# Patient Record
Sex: Male | Born: 1960 | Race: White | Hispanic: No | Marital: Single | Smoking: Former smoker
Health system: Southern US, Community
[De-identification: ages and names within clinical notes are randomized; demographics above are authoritative.]

## PROBLEM LIST (undated history)

## (undated) DIAGNOSIS — E119 Type 2 diabetes mellitus without complications: Secondary | ICD-10-CM

## (undated) DIAGNOSIS — C61 Malignant neoplasm of prostate: Secondary | ICD-10-CM

## (undated) DIAGNOSIS — F209 Schizophrenia, unspecified: Secondary | ICD-10-CM

## (undated) DIAGNOSIS — I10 Essential (primary) hypertension: Secondary | ICD-10-CM

## (undated) HISTORY — PX: INSERTION PROSTATE RADIATION SEED: SUR718

---

## 2013-04-28 DIAGNOSIS — F319 Bipolar disorder, unspecified: Secondary | ICD-10-CM | POA: Insufficient documentation

## 2018-05-15 DIAGNOSIS — N183 Chronic kidney disease, stage 3 unspecified: Secondary | ICD-10-CM | POA: Insufficient documentation

## 2018-12-14 ENCOUNTER — Other Ambulatory Visit: Payer: Self-pay

## 2018-12-14 ENCOUNTER — Encounter: Payer: Self-pay | Admitting: Emergency Medicine

## 2018-12-14 ENCOUNTER — Emergency Department
Admission: EM | Admit: 2018-12-14 | Discharge: 2018-12-15 | Disposition: A | Discharge status: Other Acute Inpatient Hospital | Payer: Medicaid Other | Attending: Pediatrics | Admitting: Pediatrics

## 2018-12-14 DIAGNOSIS — F200 Paranoid schizophrenia: Secondary | ICD-10-CM | POA: Insufficient documentation

## 2018-12-14 DIAGNOSIS — Z20828 Contact with and (suspected) exposure to other viral communicable diseases: Secondary | ICD-10-CM | POA: Insufficient documentation

## 2018-12-14 DIAGNOSIS — F209 Schizophrenia, unspecified: Secondary | ICD-10-CM | POA: Insufficient documentation

## 2018-12-14 DIAGNOSIS — R45851 Suicidal ideations: Secondary | ICD-10-CM | POA: Diagnosis not present

## 2018-12-14 DIAGNOSIS — I10 Essential (primary) hypertension: Secondary | ICD-10-CM | POA: Diagnosis not present

## 2018-12-14 DIAGNOSIS — E119 Type 2 diabetes mellitus without complications: Secondary | ICD-10-CM | POA: Diagnosis not present

## 2018-12-14 HISTORY — DX: Schizophrenia, unspecified: F20.9

## 2018-12-14 HISTORY — DX: Type 2 diabetes mellitus without complications: E11.9

## 2018-12-14 HISTORY — DX: Essential (primary) hypertension: I10

## 2018-12-14 LAB — URINE DRUG SCREEN, QUALITATIVE (ARMC ONLY)
Amphetamines, Ur Screen: NOT DETECTED
Barbiturates, Ur Screen: NOT DETECTED
Benzodiazepine, Ur Scrn: POSITIVE — AB
Cannabinoid 50 Ng, Ur ~~LOC~~: NOT DETECTED
Cocaine Metabolite,Ur ~~LOC~~: NOT DETECTED
MDMA (Ecstasy)Ur Screen: NOT DETECTED
Methadone Scn, Ur: NOT DETECTED
Opiate, Ur Screen: NOT DETECTED
Phencyclidine (PCP) Ur S: NOT DETECTED
Tricyclic, Ur Screen: NOT DETECTED

## 2018-12-14 LAB — COMPREHENSIVE METABOLIC PANEL
ALT: 14 U/L (ref 0–44)
AST: 46 U/L — ABNORMAL HIGH (ref 15–41)
Albumin: 4.6 g/dL (ref 3.5–5.0)
Alkaline Phosphatase: 61 U/L (ref 38–126)
Anion gap: 11 (ref 5–15)
BUN: 22 mg/dL — ABNORMAL HIGH (ref 6–20)
CO2: 28 mmol/L (ref 22–32)
Calcium: 9.3 mg/dL (ref 8.9–10.3)
Chloride: 102 mmol/L (ref 98–111)
Creatinine, Ser: 1.29 mg/dL — ABNORMAL HIGH (ref 0.61–1.24)
GFR calc Af Amer: >60 mL/min (ref >60)
GFR calc non Af Amer: 60 mL/min — ABNORMAL LOW (ref >60)
Glucose, Bld: 124 mg/dL — ABNORMAL HIGH (ref 70–99)
Potassium: 3.6 mmol/L (ref 3.5–5.1)
Sodium: 141 mmol/L (ref 135–145)
Total Bilirubin: 0.9 mg/dL (ref 0.3–1.2)
Total Protein: 7.9 g/dL (ref 6.5–8.1)

## 2018-12-14 LAB — CBC
HCT: 31.5 % — ABNORMAL LOW (ref 39.0–52.0)
Hemoglobin: 11.1 g/dL — ABNORMAL LOW (ref 13.0–17.0)
MCH: 29.8 pg (ref 26.0–34.0)
MCHC: 35.2 g/dL (ref 30.0–36.0)
MCV: 84.7 fL (ref 80.0–100.0)
Platelets: 154 10*3/uL (ref 150–400)
RBC: 3.72 MIL/uL — ABNORMAL LOW (ref 4.22–5.81)
RDW: 13.3 % (ref 11.5–15.5)
WBC: 4.8 10*3/uL (ref 4.0–10.5)
nRBC: 0 % (ref 0.0–0.2)

## 2018-12-14 LAB — SALICYLATE LEVEL: Salicylate Lvl: <7 mg/dL (ref 2.8–30.0)

## 2018-12-14 LAB — ACETAMINOPHEN LEVEL: Acetaminophen (Tylenol), Serum: <10 ug/mL — ABNORMAL LOW (ref 10–30)

## 2018-12-14 LAB — ETHANOL: Alcohol, Ethyl (B): <10 mg/dL (ref <10)

## 2018-12-14 LAB — SARS CORONAVIRUS 2 BY RT PCR (HOSPITAL ORDER, PERFORMED IN ~~LOC~~ HOSPITAL LAB): SARS Coronavirus 2: NEGATIVE

## 2018-12-14 MED ORDER — RISPERIDONE 1 MG PO TABS
1.0000 mg | ORAL_TABLET | Freq: Once | ORAL | Status: AC
  Administered 2018-12-14: 1 mg via ORAL
  Filled 2018-12-14: qty 1

## 2018-12-14 MED ORDER — LORAZEPAM 1 MG PO TABS
1.0000 mg | ORAL_TABLET | ORAL | Status: DC | PRN
  Administered 2018-12-15 (×2): 1 mg via ORAL
  Filled 2018-12-14 (×2): qty 1

## 2018-12-14 NOTE — BH Assessment (Signed)
Assessment Note  Peter Parsons is an 58 y.o. male who presents to ED reporting "I feel like exposing myself to women and I'm gay so I don't understand". Patient was recently admitted to [facility] a few weeks ago for a similar presentation. Pt is currently linked with [facility] in ACT Team services. He states he is 2 days past due for getting his scheduled Invega injection. He reports auditory hallucinations with command telling him to kill himself. He denied VH/HI. He has current legal involvement with a pending charge of Assault on a Male. Patient has an upcoming court date scheduled for 12/22/2018 in [place]. Patient has 2 past suicide attempts - once when he attempted to overdose on sleeping pills and he attempted to slit his wrist while he was incarcerated. Although, patient presented with a blunted affect he was alert and oriented x4.  Diagnosis: Schizophrenia, by history  Past Medical History:  Past Medical History:  Diagnosis Date  . Diabetes mellitus without complication ([place])   . Hypertension   . Schizophrenia ([place])     History reviewed. No pertinent surgical history.  Family History: No family history on file.  Social History:  has no history on file for tobacco, alcohol, and drug.  Additional Social History:  Alcohol / Drug Use Pain Medications: See MAR Prescriptions: See MAR Over the Counter: See MAR History of alcohol / drug use?: No history of alcohol / drug abuse Longest period of sobriety (when/how long): UKN  CIWA: CIWA-Ar BP: (!) 167/91 Pulse Rate: 88 COWS:    Allergies: Not on File  Home Medications: (Not in a hospital admission)   OB/GYN Status:  No LMP for male patient.  General Assessment Data Location of Assessment: [facility] ED TTS Assessment: In system Is this a Tele or Face-to-Face Assessment?: Face-to-Face Is this an Initial Assessment or a Re-assessment for this encounter?: Initial Assessment Patient Accompanied by::  N/A Language Other than English: No Living Arrangements: In Group Home: (Comment: Name of [facility]) What gender do you identify as?: Male Marital status: Single Maiden name: N/A Living Arrangements: Group Home Can pt return to current living arrangement?: Yes Admission Status: Voluntary Is patient capable of signing voluntary admission?: Yes Referral Source: Self/Family/Friend Insurance type: [place] Medicaid  Medical Screening Exam ([place]) Medical Exam completed: Yes  Crisis Care Plan Living Arrangements: Group Home Legal Guardian: Other: Name of Psychiatrist: [facility] Name of Therapist: [facility]  Education Status Is patient currently in school?: No Is the patient employed, unemployed or receiving disability?: Receiving disability income  Risk to self with the past 6 months Suicidal Ideation: Yes-Currently Present Has patient been a risk to self within the past 6 months prior to admission? : No Suicidal Intent: Yes-Currently Present Has patient had any suicidal intent within the past 6 months prior to admission? : No Is patient at risk for suicide?: Yes Suicidal Plan?: No Has patient had any suicidal plan within the past 6 months prior to admission? : No Access to Means: Yes Specify Access to Suicidal Means: Access to sharp and lethal objects What has been your use of drugs/alcohol within the last 12 months?: None Previous Attempts/Gestures: Yes How many times?: 2 Other Self Harm Risks: None Triggers for Past Attempts: Family contact, Other (Comment)(Mental Illness) Intentional Self Injurious Behavior: None Family Suicide History: Unknown Recent stressful life event(s): Other (Comment)(Unstable Housing) Persecutory voices/beliefs?: Yes Depression: Yes Depression Symptoms: Despondent, Insomnia, Guilt, Feeling worthless/self pity, Loss of interest in usual pleasures, Isolating Substance  abuse history and/or treatment for substance abuse?:  No Suicide prevention information given to non-admitted patients: Not applicable  Risk to Others within the past 6 months Homicidal Ideation: No Does patient have any lifetime risk of violence toward others beyond the six months prior to admission? : No Thoughts of Harm to Others: No Current Homicidal Intent: No Current Homicidal Plan: No Access to Homicidal Means: No Identified Victim: None History of harm to others?: No Assessment of Violence: None Noted Violent Behavior Description: None Does patient have access to weapons?: No Criminal Charges Pending?: Yes Describe Pending Criminal Charges: Misdemeanor-ASSAULT ON A MALE Does patient have a court date: Yes Court Date: 12/22/18 Is patient on probation?: No  Psychosis Hallucinations: Auditory, With command("to kill myself") Delusions: None noted  Mental Status Report Appearance/Hygiene: In scrubs, In hospital gown Eye Contact: Fair Motor Activity: Freedom of movement Speech: Logical/coherent Level of Consciousness: Alert Mood: Pleasant Affect: Blunted Anxiety Level: Minimal Thought Processes: Coherent Judgement: Partial Orientation: Person, Place, Time, Situation Obsessive Compulsive Thoughts/Behaviors: None  Cognitive Functioning Concentration: Normal Memory: Recent Intact, Remote Intact Is patient IDD: No Insight: Fair Impulse Control: Poor Appetite: Fair Have you had any weight changes? : No Change Sleep: Decreased Total Hours of Sleep: 6 Vegetative Symptoms: None  ADLScreening [facility] Assessment Services) Patient's cognitive ability adequate to safely complete daily activities?: Yes Patient able to express need for assistance with ADLs?: Yes Independently performs ADLs?: Yes (appropriate for developmental age)  Prior Inpatient Therapy Prior Inpatient Therapy: Yes Prior Therapy Dates: 2 weeks ago Prior Therapy Facilty/Provider(s): [facility] Reason for Treatment: Schizophrenia  Prior Outpatient  Therapy Prior Outpatient Therapy: Yes Prior Therapy Dates: Current Prior Therapy Facilty/Provider(s): [facility] Reason for Treatment: Schizophrenia Does patient have an ACCT team?: Yes Does patient have Intensive In-House Services?  : No Does patient have [facility] services? : No Does patient have P4CC services?: No  ADL Screening (condition at time of admission) Patient's cognitive ability adequate to safely complete daily activities?: Yes Patient able to express need for assistance with ADLs?: Yes Independently performs ADLs?: Yes (appropriate for developmental age)       Abuse/Neglect Assessment (Assessment to be complete while patient is alone) Abuse/Neglect Assessment Can Be Completed: Yes Physical Abuse: Denies Verbal Abuse: Denies Sexual Abuse: Denies Exploitation of patient/patient's resources: Denies Self-Neglect: Denies Values / Beliefs Cultural Requests During Hospitalization: None Spiritual Requests During Hospitalization: None Consults Spiritual Care Consult Needed: No Social Work Consult Needed: No         Child/Adolescent Assessment Running Away Risk: (Patient is an adult)  Disposition:  Disposition Initial Assessment Completed for this Encounter: Yes Disposition of Patient: Admit Type of inpatient treatment program: Adult Patient refused recommended treatment: No Mode of transportation if patient is discharged/movement?: N/A Patient referred to: Other (Comment)([facility])  On Site Evaluation by:   Reviewed with Physician:    UBALDO, SIVA 12/14/2018 7:58 PM

## 2018-12-14 NOTE — ED Notes (Signed)
Patient assigned to appropriate care area   Introduced self to pt  Patient oriented to unit/care area: Informed that, for their safety, care areas are designed for safety and visiting and phone hours explained to patient. Patient verbalizes understanding, and verbal contract for safety obtained  Environment secured    Patient appropriate ad cooperative

## 2018-12-14 NOTE — ED Notes (Signed)
Hourly rounding reveals patient in room asleep, eyes closed, equal & unlabored RR. No complaints, stable, in no acute distress. Q15 minute rounds and monitoring via [employer] to continue.

## 2018-12-14 NOTE — ED Notes (Signed)
VOL/Consult ordered/ Completed/ Moved to B[facility]-3

## 2018-12-14 NOTE — ED Triage Notes (Signed)
Pt reports he ran away from his group home last night and slept in a storage building trying to get to [place] where his parents live. Pt reports is hearing voices and thinking of girls and states that he is a homosexual so that is not right. Pt also reports SI and reports he plans to drown himself. Pt states he wants to jump from [address] and drown.

## 2018-12-14 NOTE — ED Notes (Signed)
BEHAVIORAL HEALTH ROUNDING Patient sleeping: No. Patient alert and oriented: yes Behavior appropriate: Yes.  ; If no, describe:  Nutrition and fluids offered: yes Toileting and hygiene offered: Yes  Sitter present: q15 minute observations  Law enforcement present: Yes  [employer]   ENVIRONMENTAL ASSESSMENT Potentially harmful objects out of patient reach: Yes.   Personal belongings secured: Yes.   Patient dressed in hospital provided attire only: Yes.   Plastic bags out of patient reach: Yes.   Patient care equipment (cords, cables, call bells, lines, and drains) shortened, removed, or accounted for: Yes.   Equipment and supplies removed from bottom of stretcher: Yes.   Potentially toxic materials out of patient reach: Yes.   Sharps container removed or out of patient reach: Yes.   

## 2018-12-14 NOTE — Consult Note (Signed)
[place] [facility] Consult   Reason for Consult: Schizophrenia with suicidal ideation Referring Physician: Dr. Archie Balboa Patient Identification: Peter Parsons MRN:  [id number] Principal Diagnosis: <principal problem not specified> Diagnosis:  Active Problems:   * No active hospital problems. *   Total Time spent with patient: 45 minutes  Subjective:   Peter Parsons is a 58 y.o. male patient with a history of schizophrenia who presented with suicidal ideation and command auditory hallucinations to hurt self.  HPI: Patient is a 57 year old male with a history of schizophrenia who presented with suicidal ideation and command auditory auditory hallucinations to hurt himself.  Patient is also complaining at this time at fast feeling impulsive to show his body to women.  Patient reports that this is especially particular since he is a gay man.  Patient is unsure where these all impulses come from but he feels that it might be related to his medication.  Patient states that he is on [place] but has missed his most recent dose by approximately 2 days.  Patient states he is currently residing in a group home but reporting today after hearing command auditory hallucinations to hurt himself.  Patient states that these hallucinations have been present for quite some time however they have recently become more distressing.  Patient is requesting himself to be admitted.  Patient reports he otherwise lives in a group home where he has been for approximately 8 days.  prior to that he has was staying at his home in [place] with his mom and dad who now have dementia.  Patient states that he was recently in [facility] which he felt was helpful but felt that he was discharged before he had resolution of his symptoms and feels he is decompensated further since leaving.  Patient reports poor sleep since leaving the hospital as well as feelings of nervousness and anxiousness.  Past Psychiatric History: Patient has  a history of paranoid schizophrenia and has 5 or 6 previous inpatient stays most recent inpatient stay was at [facility] approximately 2 weeks ago patient has 2 prior suicide attempts he tried to slit his wrist in jail and when he was 58 years old he had an overdose  Risk to Self: Suicidal Ideation: Yes-Currently Present Suicidal Intent: Yes-Currently Present Is patient at risk for suicide?: Yes Suicidal Plan?: No Access to Means: Yes Specify Access to Suicidal Means: Access to sharp and lethal objects What has been your use of drugs/alcohol within the last 12 months?: None How many times?: 2 Other Self Harm Risks: None Triggers for Past Attempts: Family contact, Other (Comment)(Mental Illness) Intentional Self Injurious Behavior: None Risk to Others: Homicidal Ideation: No Thoughts of Harm to Others: No Current Homicidal Intent: No Current Homicidal Plan: No Access to Homicidal Means: No Identified Victim: None History of harm to others?: No Assessment of Violence: None Noted Violent Behavior Description: None Does patient have access to weapons?: No Criminal Charges Pending?: Yes Describe Pending Criminal Charges: Misdemeanor-ASSAULT ON A MALE Does patient have a court date: Yes Court Date: 12/22/18 Prior Inpatient Therapy: Prior Inpatient Therapy: Yes Prior Therapy Dates: 2 weeks ago Prior Therapy Facilty/Provider(s): [facility] Reason for Treatment: Schizophrenia Prior Outpatient Therapy: Prior Outpatient Therapy: Yes Prior Therapy Dates: Current Prior Therapy Facilty/Provider(s): [facility] Reason for Treatment: Schizophrenia Does patient have an ACCT team?: Yes Does patient have Intensive In-House Services?  : No Does patient have [facility] services? : No Does patient have P4CC services?: No  Past Medical History:  Past  Medical History:  Diagnosis Date  . Diabetes mellitus without complication ([place])   . Hypertension   . Schizophrenia ([place])     History reviewed. No pertinent surgical history. Family History: No family history on file. Family Psychiatric  History: Patient denies Social History:  Social History   Substance and Sexual Activity  Alcohol Use None     Social History   Substance and Sexual Activity  Drug Use Not on file    Social History   Socioeconomic History  . Marital status: Single    Spouse name: Not on file  . Number of children: Not on file  . Years of education: Not on file  . Highest education level: Not on file  Occupational History  . Not on file  Social Needs  . Financial resource strain: Not on file  . Food insecurity    Worry: Not on file    Inability: Not on file  . Transportation needs    Medical: Not on file    Non-medical: Not on file  Tobacco Use  . Smoking status: Not on file  Substance and Sexual Activity  . Alcohol use: Not on file  . Drug use: Not on file  . Sexual activity: Not on file  Lifestyle  . Physical activity    Days per week: Not on file    Minutes per session: Not on file  . Stress: Not on file  Relationships  . Social Herbalist on phone: Not on file    Gets together: Not on file    Attends religious service: Not on file    Active member of club or organization: Not on file    Attends meetings of clubs or organizations: Not on file    Relationship status: Not on file  Other Topics Concern  . Not on file  Social History Narrative  . Not on file   Additional Social History: Patient is disabled without any current employment.    Allergies:  Not on File  Labs:  Results for orders placed or performed during the hospital encounter of 12/14/18 (from the past 48 hour(s))  Comprehensive metabolic panel     Status: Abnormal   Collection Time: 12/14/18  3:00 PM  Result Value Ref Range   Sodium 141 135 - 145 mmol/L   Potassium 3.6 3.5 - 5.1 mmol/L   Chloride 102 98 - 111 mmol/L   CO2 28 22 - 32 mmol/L   Glucose, Bld 124 (H) 70 - 99 mg/dL   BUN  22 (H) 6 - 20 mg/dL   Creatinine, Ser 1.29 (H) 0.61 - 1.24 mg/dL   Calcium 9.3 8.9 - 10.3 mg/dL   Total Protein 7.9 6.5 - 8.1 g/dL   Albumin 4.6 3.5 - 5.0 g/dL   AST 46 (H) 15 - 41 U/L   ALT 14 0 - 44 U/L   Alkaline Phosphatase 61 38 - 126 U/L   Total Bilirubin 0.9 0.3 - 1.2 mg/dL   GFR calc non Af Amer 60 (L) >60 mL/min   GFR calc Af Amer >60 >60 mL/min   Anion gap 11 5 - 15    Comment: Performed at [facility], [address] [place],  [address]  Ethanol     Status: None   Collection Time: 12/14/18  3:00 PM  Result Value Ref Range   Alcohol, Ethyl (B) <10 <10 mg/dL    Comment: (NOTE) Lowest detectable limit for serum alcohol is 10 mg/dL. For medical  purposes only. Performed at [facility], [address] [place], [place] [id number]   Salicylate level     Status: None   Collection Time: 12/14/18  3:00 PM  Result Value Ref Range   Salicylate Lvl [id number] 2.8 - 30.0 mg/dL    Comment: Performed at [facility], [address] [place], [place] [address]  Acetaminophen level     Status: Abnormal   Collection Time: 12/14/18  3:00 PM  Result Value Ref Range   Acetaminophen (Tylenol), Serum <10 (L) 10 - 30 ug/mL    Comment: (NOTE) Therapeutic concentrations vary significantly. A range of 10-30 ug/mL  may be an effective concentration for many patients. However, some  are best treated at concentrations outside of this range. Acetaminophen concentrations >150 ug/mL at 4 hours after ingestion  and >50 ug/mL at 12 hours after ingestion are often associated with  toxic reactions. Performed at [facility], [address] [place], [place] [address]   cbc     Status: Abnormal   Collection Time: 12/14/18  3:00 PM  Result Value Ref Range   WBC 4.8 4.0 - 10.5 K/uL   RBC 3.72 (L) 4.22 - 5.81 MIL/uL   Hemoglobin 11.1 (L) 13.0 - 17.0 g/dL   HCT 31.5 (L) 39.0 - 52.0 %   MCV 84.7 80.0 - 100.0 fL   MCH 29.8 26.0 - 34.0 pg   MCHC 35.2 30.0 -  36.0 g/dL   RDW 13.3 11.5 - 15.5 %   Platelets 154 150 - 400 K/uL   nRBC 0.0 0.0 - 0.2 %    Comment: Performed at [facility], [address], [place],  [address]  Urine Drug Screen, Qualitative     Status: Abnormal   Collection Time: 12/14/18  3:00 PM  Result Value Ref Range   Tricyclic, Ur Screen NONE DETECTED NONE DETECTED   Amphetamines, Ur Screen NONE DETECTED NONE DETECTED   MDMA (Ecstasy)Ur Screen NONE DETECTED NONE DETECTED   Cocaine Metabolite,Ur [place] NONE DETECTED NONE DETECTED   Opiate, Ur Screen NONE DETECTED NONE DETECTED   Phencyclidine (PCP) Ur S NONE DETECTED NONE DETECTED   Cannabinoid 50 Ng, Ur [place] NONE DETECTED NONE DETECTED   Barbiturates, Ur Screen NONE DETECTED NONE DETECTED   Benzodiazepine, Ur Scrn POSITIVE (A) NONE DETECTED   Methadone Scn, Ur NONE DETECTED NONE DETECTED    Comment: (NOTE) Tricyclics + metabolites, urine    Cutoff 1000 ng/mL Amphetamines + metabolites, urine  Cutoff 1000 ng/mL MDMA (Ecstasy), urine              Cutoff 500 ng/mL Cocaine Metabolite, urine          Cutoff 300 ng/mL Opiate + metabolites, urine        Cutoff 300 ng/mL Phencyclidine (PCP), urine         Cutoff 25 ng/mL Cannabinoid, urine                 Cutoff 50 ng/mL Barbiturates + metabolites, urine  Cutoff 200 ng/mL Benzodiazepine, urine              Cutoff 200 ng/mL Methadone, urine                   Cutoff 300 ng/mL The urine drug screen provides only a preliminary, unconfirmed analytical test result and should not be used for non-medical purposes. Clinical consideration and professional judgment should be applied to any positive drug screen result due to possible interfering substances.  A more specific alternate chemical method must be used in order to obtain a confirmed analytical result. Gas chromatography / mass spectrometry (GC/MS) is the preferred confirmat ory method. Performed at [facility], [address] [place], [place] [address]    SARS Coronavirus 2 by RT PCR (hospital order, performed in [facility] hospital lab) Nasopharyngeal Nasopharyngeal Swab     Status: None   Collection Time: 12/14/18  4:05 PM   Specimen: Nasopharyngeal Swab  Result Value Ref Range   SARS Coronavirus 2 NEGATIVE NEGATIVE    Comment: (NOTE) If result is NEGATIVE SARS-CoV-2 target nucleic acids are NOT DETECTED. The SARS-CoV-2 RNA is generally detectable in upper and lower  respiratory specimens during the acute phase of infection. The lowest  concentration of SARS-CoV-2 viral copies this assay can detect is 250  copies / mL. A negative result does not preclude SARS-CoV-2 infection  and should not be used as the sole basis for treatment or other  patient management decisions.  A negative result may occur with  improper specimen collection / handling, submission of specimen other  than nasopharyngeal swab, presence of viral mutation(s) within the  areas targeted by this assay, and inadequate number of viral copies  (<250 copies / mL). A negative result must be combined with clinical  observations, patient history, and epidemiological information. If result is POSITIVE SARS-CoV-2 target nucleic acids are DETECTED. The SARS-CoV-2 RNA is generally detectable in upper and lower  respiratory specimens dur ing the acute phase of infection.  Positive  results are indicative of active infection with SARS-CoV-2.  Clinical  correlation with patient history and other diagnostic information is  necessary to determine patient infection status.  Positive results do  not rule out bacterial infection or co-infection with other viruses. If result is PRESUMPTIVE POSTIVE SARS-CoV-2 nucleic acids MAY BE PRESENT.   A presumptive positive result was obtained on the submitted specimen  and confirmed on repeat testing.  While 2019 novel coronavirus  (SARS-CoV-2) nucleic acids may be present in the submitted sample  additional confirmatory testing may be  necessary for epidemiological  and / or clinical management purposes  to differentiate between  SARS-CoV-2 and other Sarbecovirus currently known to infect humans.  If clinically indicated additional testing with an alternate test  methodology [id number]) is advised. The SARS-CoV-2 RNA is generally  detectable in upper and lower respiratory sp ecimens during the acute  phase of infection. The expected result is Negative. Fact Sheet for Patients:  StrictlyIdeas.no Fact Sheet for Healthcare Providers: BankingDealers.co.za This test is not yet approved or cleared by the [place] FDA and has been authorized for detection and/or diagnosis of SARS-CoV-2 by FDA under an Emergency Use Authorization (EUA).  This EUA will remain in effect (meaning this test can be used) for the duration of the COVID-19 declaration under Section 564(b)(1) of the Act, 21 U.S.C. section 360bbb-3(b)(1), unless the authorization is terminated or revoked sooner. Performed at [facility], [address], [place], [place] [address]     Current Facility-Administered Medications  Medication Dose Route Frequency Provider Last Rate Last Dose  . LORazepam (ATIVAN) tablet 1 mg  1 mg Oral Q4H PRN Karri Kallenbach, Dorene Ar, MD       No current outpatient medications on file.    Musculoskeletal: Strength & Muscle Tone: within normal limits Gait & Station: normal Patient leans: N/A  Psychiatric Specialty Exam: Physical Exam  Review of Systems  Constitutional: Negative.   HENT: Negative.   Skin: Negative.  Psychiatric/Behavioral: Positive for depression, hallucinations and suicidal ideas. Negative for substance abuse. The patient is nervous/anxious and has insomnia.     Blood pressure (!) 167/91, pulse 88, resp. rate 20, height 5\' 10"  (1.778 m), weight 120.2 kg, SpO2 98 %.Body mass index is 38.02 kg/m.  General Appearance: Guarded  Eye Contact:  Fair  Speech:   Slow  Volume:  Decreased  Mood:  Anxious and Depressed  Affect:  Congruent and Constricted  Thought Process:  Goal Directed  Orientation:  Full (Time, Place, and Person)  Thought Content:  Hallucinations: Auditory Command:  hurt self and Paranoid Ideation  Suicidal Thoughts:  Yes.  with intent/plan  Homicidal Thoughts:  No  Memory:  Recent;   Fair  Judgement:  Impaired  Insight:  Fair  Psychomotor Activity:  Normal  Concentration:  Concentration: Fair  Recall:  [employer] of Knowledge:  Fair  Language:  Fair  Akathisia:  No  Handed:  Right  AIMS (if indicated):     Assets:  Armed forces logistics/support/administrative officer Housing Leisure Time Physical Health Resilience  ADL's:  Intact  Cognition:  WNL  Sleep:       Assessment: Patient is a 58 year old male with a past history of paranoid schizophrenia who presents to the ED decompensated with complaints of command auditory hallucinations as well as suicidal ideation.  At this point patient requires inpatient hospitalization for safety, stabilization, and medication management.  Medications: Patient given 1 mg of risperidone in the emergency room, Ativan 1 mg q. as needed- anxiety  Diagnosis: Paranoid schizophrenia  Disposition: Recommend psychiatric Inpatient admission when medically cleared.   Dixie Dials, MD 12/14/2018 8:06 PM

## 2018-12-14 NOTE — ED Provider Notes (Signed)
[facility] Emergency Department Provider Note  ____________________________________________   I have reviewed the triage vital signs and the nursing notes.   HISTORY  Chief Complaint Hallucinations and Suicidal   History limited by: Not Limited   HPI Peter Parsons is a 58 y.o. male who presents to the emergency department today because of concerns for hallucinations and SI. The patient states that the symptoms started two days ago. The hallucinations are telling him to expose himself to the opposite sex. He has also been having thoughts of wanting to kill himself, and thought about drowning himself yesterday. The patient denies any self injury or abnormal ingestion. Denies any recent changes to his medication. Denies any recent illness or fever.   Records reviewed. Per medical record review patient has a history of schizophrenia, DM, HTN.   Past Medical History:  Diagnosis Date  . Diabetes mellitus without complication ([place])   . Hypertension   . Schizophrenia ([place])     There are no active problems to display for this patient.   History reviewed. No pertinent surgical history.  Prior to Admission medications   Not on File    Allergies Patient has no allergy information on record.  No family history on file.  Social History Social History   Tobacco Use  . Smoking status: Not on file  Substance Use Topics  . Alcohol use: Not on file  . Drug use: Not on file    Review of Systems Constitutional: No fever/chills Eyes: No visual changes. ENT: No sore throat. Cardiovascular: Denies chest pain. Respiratory: Denies shortness of breath. Gastrointestinal: No abdominal pain.  No nausea, no vomiting.  No diarrhea.   Genitourinary: Negative for dysuria. Musculoskeletal: Negative for back pain. Skin: Negative for rash. Neurological: Negative for headaches, focal weakness or numbness.  ____________________________________________   PHYSICAL  EXAM:  VITAL SIGNS: ED Triage Vitals [12/14/18 1436]  Enc Vitals Group     BP (!) 167/91     Pulse Rate 88     Resp 20     Temp      Temp src      SpO2 98 %     Weight 265 lb (120.2 kg)     Height 5\' 10"  (1.778 m)     Head Circumference      Peak Flow      Pain Score 0   Constitutional: Alert and oriented.  Eyes: Conjunctivae are normal.  ENT      Head: Normocephalic and atraumatic.      Nose: No congestion/rhinnorhea.      Mouth/Throat: Mucous membranes are moist.      Neck: No stridor. Hematological/Lymphatic/Immunilogical: No cervical lymphadenopathy. Cardiovascular: Normal rate, regular rhythm.  No murmurs, rubs, or gallops.  Respiratory: Normal respiratory effort without tachypnea nor retractions. Breath sounds are clear and equal bilaterally. No wheezes/rales/rhonchi. Gastrointestinal: Soft and non tender. No rebound. No guarding.  Genitourinary: Deferred Musculoskeletal: Normal range of motion in all extremities. No lower extremity edema. Neurologic:  Normal speech and language. No gross focal neurologic deficits are appreciated.  Skin:  Skin is warm, dry and intact. No rash noted. Psychiatric: Mood and affect are normal. Speech and behavior are normal. Patient exhibits appropriate insight and judgment. Does not appear to be responding to internal stimuli.   ____________________________________________    LABS (pertinent positives/negatives)  CBC wbc 4.8, hgb 11.1, plt 154 CMP wnl except glu 124, BUN 22, cr 1.29, ast 46 Acetaminophen, salicylate, ethanol below threshold UDS positive benzo ____________________________________________  EKG  None  ____________________________________________    RADIOLOGY  None  ____________________________________________   PROCEDURES  Procedures  ____________________________________________   INITIAL IMPRESSION / ASSESSMENT AND PLAN / ED COURSE  Pertinent labs & imaging results that were available during my  care of the patient were reviewed by me and considered in my medical decision making (see chart for details).   Patient presented to the emergency department today because of concerns for hallucinations and SI.  No obvious response to internal stimuli on my exam.  Psychiatry did evaluate and will plan on admission.  ____________________________________________   FINAL CLINICAL IMPRESSION(S) / ED DIAGNOSES  Final diagnoses:  Schizophrenia, unspecified type ([place])     Note: This dictation was prepared with Dragon dictation. Any transcriptional errors that result from this process are unintentional     Nance Pear, MD 12/14/18 2034

## 2018-12-14 NOTE — ED Notes (Signed)
Hourly rounding reveals patient in room asleep, eyes closed with equal unlabored RR. No complaints, stable, in no acute distress. Q15 minute rounds and monitoring via [employer] to continue.

## 2018-12-14 NOTE — ED Triage Notes (Signed)
Peter Parsons, [phone], [facility]

## 2018-12-14 NOTE — ED Notes (Addendum)
Report to include Situation, Background, Assessment, and Recommendations received from Jadeka RN. Patient alert and oriented, warm and dry, in no acute distress. Patient denies SI, HI, AVH and pain. Patient made aware of Q15 minute rounds and security cameras for their safety. Patient instructed to come to me with needs or concerns. 

## 2018-12-14 NOTE — ED Notes (Signed)
Hourly rounding reveals patient in room asleep, eyes closed, RR equal and unlabored. No complaints, stable, in no acute distress. Q15 minute rounds and monitoring via [employer] to continue.

## 2018-12-15 ENCOUNTER — Other Ambulatory Visit: Payer: Self-pay

## 2018-12-15 ENCOUNTER — Inpatient Hospital Stay
Admission: RE | Admit: 2018-12-15 | Discharge: 2019-01-05 | DRG: 885 | Disposition: A | Discharge status: Home / Self Care | Payer: Medicaid Other | Source: Intra-hospital | Attending: Psychiatry | Admitting: Psychiatry

## 2018-12-15 DIAGNOSIS — R45851 Suicidal ideations: Secondary | ICD-10-CM | POA: Diagnosis present

## 2018-12-15 DIAGNOSIS — F209 Schizophrenia, unspecified: Principal | ICD-10-CM | POA: Diagnosis present

## 2018-12-15 DIAGNOSIS — Z7984 Long term (current) use of oral hypoglycemic drugs: Secondary | ICD-10-CM | POA: Diagnosis not present

## 2018-12-15 DIAGNOSIS — T43595A Adverse effect of other antipsychotics and neuroleptics, initial encounter: Secondary | ICD-10-CM | POA: Diagnosis not present

## 2018-12-15 DIAGNOSIS — Z888 Allergy status to other drugs, medicaments and biological substances status: Secondary | ICD-10-CM | POA: Diagnosis not present

## 2018-12-15 DIAGNOSIS — Y92230 Patient room in hospital as the place of occurrence of the external cause: Secondary | ICD-10-CM | POA: Diagnosis not present

## 2018-12-15 DIAGNOSIS — G4700 Insomnia, unspecified: Secondary | ICD-10-CM | POA: Diagnosis present

## 2018-12-15 DIAGNOSIS — Z79899 Other long term (current) drug therapy: Secondary | ICD-10-CM

## 2018-12-15 DIAGNOSIS — F329 Major depressive disorder, single episode, unspecified: Secondary | ICD-10-CM | POA: Diagnosis present

## 2018-12-15 DIAGNOSIS — G2571 Drug induced akathisia: Secondary | ICD-10-CM | POA: Diagnosis not present

## 2018-12-15 DIAGNOSIS — F200 Paranoid schizophrenia: Secondary | ICD-10-CM | POA: Diagnosis not present

## 2018-12-15 DIAGNOSIS — G3184 Mild cognitive impairment, so stated: Secondary | ICD-10-CM | POA: Diagnosis present

## 2018-12-15 DIAGNOSIS — F419 Anxiety disorder, unspecified: Secondary | ICD-10-CM | POA: Diagnosis present

## 2018-12-15 DIAGNOSIS — E119 Type 2 diabetes mellitus without complications: Secondary | ICD-10-CM | POA: Diagnosis present

## 2018-12-15 DIAGNOSIS — I10 Essential (primary) hypertension: Secondary | ICD-10-CM | POA: Diagnosis present

## 2018-12-15 DIAGNOSIS — Z915 Personal history of self-harm: Secondary | ICD-10-CM | POA: Diagnosis not present

## 2018-12-15 DIAGNOSIS — Z5181 Encounter for therapeutic drug level monitoring: Secondary | ICD-10-CM

## 2018-12-15 MED ORDER — ALUM & MAG HYDROXIDE-SIMETH 200-200-20 MG/5ML PO SUSP
30.0000 mL | ORAL | Status: DC | PRN
  Administered 2018-12-20: 30 mL via ORAL
  Filled 2018-12-15: qty 30

## 2018-12-15 MED ORDER — ACETAMINOPHEN 325 MG PO TABS: 650.0000 mg | ORAL_TABLET | Freq: Four times a day (QID) | ORAL | Status: DC | PRN

## 2018-12-15 MED ORDER — LORAZEPAM 1 MG PO TABS
1.0000 mg | ORAL_TABLET | ORAL | Status: DC | PRN
  Administered 2018-12-15 – 2019-01-03 (×15): 1 mg via ORAL
  Filled 2018-12-15 (×15): qty 1

## 2018-12-15 MED ORDER — MAGNESIUM HYDROXIDE 400 MG/5ML PO SUSP: 30.0000 mL | Freq: Every day | ORAL | Status: DC | PRN

## 2018-12-15 NOTE — ED Notes (Signed)
BEHAVIORAL HEALTH ROUNDING Patient sleeping: No. Patient alert and oriented: yes Behavior appropriate: Yes.  ; If no, describe:  Nutrition and fluids offered: yes Toileting and hygiene offered: Yes  Sitter present: q15 minute observations and security camera monitoring   

## 2018-12-15 NOTE — ED Notes (Signed)
Hourly rounding reveals patient asleep in room with lights on, eyes closed, equal & unlabored RR. No complaints, stable, in no acute distress. Q15 minute rounds and monitoring via [employer] to continue.

## 2018-12-15 NOTE — BH Assessment (Signed)
Patient is to be admitted to [facility] by Dr. Claris Gower.  Attending Physician will be Dr. Weber Cooks.   Patient has been assigned to room 314, by [place].   Intake Paper Work has been signed and placed on patient chart.   ER staff is aware of the admission:  Nitchia, ER Secretary    Dr. Jimmye Norman, ER MD   Amy T., Patient's Nurse   Elberta Fortis, Patient Access.

## 2018-12-15 NOTE — ED Notes (Signed)
Hourly rounding reveals patient in room asleep with equal unlabored RR. No complaints, stable, in no acute distress. Q15 minute rounds and monitoring via [employer] to continue.

## 2018-12-15 NOTE — ED Notes (Signed)
BEHAVIORAL HEALTH ROUNDING Patient sleeping: Yes.   Patient alert and oriented: eyes closed  Appears asleep Behavior appropriate: Yes.  ; If no, describe:  Nutrition and fluids offered: Yes  Toileting and hygiene offered: sleeping Sitter present: q 15 minutes rounding and security camera monitoring   ENVIRONMENTAL ASSESSMENT Potentially harmful objects out of patient reach: Yes.   Personal belongings secured: Yes.   Patient dressed in hospital provided attire only: Yes.   Plastic bags out of patient reach: Yes.   Patient care equipment (cords, cables, call bells, lines, and drains) shortened, removed, or accounted for: Yes.   Equipment and supplies removed from bottom of stretcher: Yes.   Potentially toxic materials out of patient reach: Yes.   Sharps container removed or out of patient reach: Yes.

## 2018-12-15 NOTE — ED Notes (Signed)
Hourly rounding reveals patient in room awake with the lights on. No complaints, stable, in no acute distress. Q15 minute rounds and monitoring via [employer] to continue.

## 2018-12-15 NOTE — ED Notes (Signed)
Hourly rounding reveals patient in room lying in bed asleep, eyes closed, equal unlabored RR. No complaints, stable, in no acute distress. Q15 minute rounds and monitoring via [employer] to continue.

## 2018-12-15 NOTE — ED Notes (Signed)
Hourly rounding reveals patient in room asleep, eyes closed with equal unlabored RR. No complaints, stable, in no acute distress. Q15 minute rounds and monitoring via [employer] to continue.

## 2018-12-15 NOTE — ED Notes (Signed)
Pt provided with a lunch tray and a sprite.

## 2018-12-15 NOTE — Tx Team (Signed)
Initial Treatment Plan 12/15/2018 5:04 PM [name] [id number]    PATIENT STRESSORS: Financial difficulties Legal issue Medication change or noncompliance   PATIENT STRENGTHS: Ability for insight Communication skills   PATIENT IDENTIFIED PROBLEMS: Run Away from [place] 12/15/2018   Suicidal 12/15/2018   Depression 12/15/18   Hearing Voices  12/15/2018  Court Date 12/22/2018              DISCHARGE CRITERIA:  Ability to meet basic life and health needs Improved stabilization in mood, thinking, and/or behavior  PRELIMINARY DISCHARGE PLAN: Outpatient therapy Return to previous living arrangement  PATIENT/FAMILY INVOLVEMENT: This treatment plan has been presented to and reviewed with the patient, Peter Parsons, and/or family member,  .  The patient and family have been given the opportunity to ask questions and make suggestions.  Leodis Liverpool, RN 12/15/2018, 5:04 PM

## 2018-12-15 NOTE — ED Notes (Signed)
ED BHU [place] Is the patient under IVC or is there intent for IVC: voluntary Is the patient medically cleared: Yes.   Is there vacancy in the ED BHU: Yes.   Is the population mix appropriate for patient: Yes.   Is the patient awaiting placement in inpatient or outpatient setting: Yes.  Awaiting a bed to be assigned on [facility] Has the patient had a psychiatric consult: Yes.   Survey of unit performed for contraband, proper placement and condition of furniture, tampering with fixtures in bathroom, shower, and each patient room: Yes.  ; Findings:  APPEARANCE/BEHAVIOR Calm and cooperative NEURO ASSESSMENT Orientation: oriented x3  Denies pain Hallucinations: No.None noted (Hallucinations) denies  Speech: Normal Gait: normal RESPIRATORY ASSESSMENT Even  Unlabored respirations  CARDIOVASCULAR ASSESSMENT Pulses equal   regular rate  Skin warm and dry   GASTROINTESTINAL ASSESSMENT no GI complaint EXTREMITIES Full ROM  PLAN OF CARE Provide calm/safe environment. Vital signs assessed twice daily. ED BHU Assessment once each 12-hour shift. Collaborate with TTS daily or as condition indicates. Assure the ED provider has rounded once each shift. Provide and encourage hygiene. Provide redirection as needed. Assess for escalating behavior; address immediately and inform ED provider.  Assess family dynamic and appropriateness for visitation as needed: Yes.  ; If necessary, describe findings:  Educate the patient/family about BHU procedures/visitation: Yes.  ; If necessary, describe findings:

## 2018-12-15 NOTE — ED Notes (Signed)
Pt up to restroom with steady gait.

## 2018-12-15 NOTE — ED Notes (Signed)
Patient observed lying in bed with eyes closed  Even, unlabored respirations observed   NAD pt appears to be sleeping  I will continue to monitor along with every 15 minute visual observations and ongoing security camera monitoring    

## 2018-12-15 NOTE — Plan of Care (Signed)
Instructed  patient  on  [place] education , unit programing , able to verbalize understanding  Problem: Education: Goal: Knowledge of [place] Education information/materials will improve 12/15/2018 1653 by Leodis Liverpool, RN Outcome: Progressing 12/15/2018 1652 by Leodis Liverpool, RN Outcome: Progressing

## 2018-12-15 NOTE — ED Notes (Signed)
Hourly rounding reveals patient in room asleep, eyes closed with equal & unlabored RR. No complaints, stable, in no acute distress. Q15 minute rounds and monitoring via [employer] to continue.
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
## 2018-12-16 DIAGNOSIS — F200 Paranoid schizophrenia: Secondary | ICD-10-CM

## 2018-12-16 LAB — HEPATIC FUNCTION PANEL
ALT: 11 U/L (ref 0–44)
AST: 23 U/L (ref 15–41)
Albumin: 4.6 g/dL (ref 3.5–5.0)
Alkaline Phosphatase: 54 U/L (ref 38–126)
Bilirubin, Direct: 0.1 mg/dL (ref 0.0–0.2)
Indirect Bilirubin: 1 mg/dL — ABNORMAL HIGH (ref 0.3–0.9)
Total Bilirubin: 1.1 mg/dL (ref 0.3–1.2)
Total Protein: 7.8 g/dL (ref 6.5–8.1)

## 2018-12-16 LAB — LIPID PANEL
Cholesterol: 110 mg/dL (ref 0–200)
HDL: 40 mg/dL — ABNORMAL LOW (ref >40)
LDL Cholesterol: 55 mg/dL (ref 0–99)
Total CHOL/HDL Ratio: 2.8 RATIO
Triglycerides: 77 mg/dL (ref <150)
VLDL: 15 mg/dL (ref 0–40)

## 2018-12-16 LAB — TSH: TSH: 3.592 u[IU]/mL (ref 0.350–4.500)

## 2018-12-16 MED ORDER — NICOTINE 14 MG/24HR TD PT24
14.0000 mg | MEDICATED_PATCH | Freq: Every day | TRANSDERMAL | Status: DC
  Administered 2018-12-16 – 2019-01-05 (×21): 14 mg via TRANSDERMAL
  Filled 2018-12-16 (×21): qty 1

## 2018-12-16 MED ORDER — TRAZODONE HCL 100 MG PO TABS
100.0000 mg | ORAL_TABLET | Freq: Every evening | ORAL | Status: DC | PRN
  Administered 2018-12-16 – 2019-01-03 (×14): 100 mg via ORAL
  Filled 2018-12-16 (×15): qty 1

## 2018-12-16 MED ORDER — PALIPERIDONE PALMITATE ER 156 MG/ML IM SUSY
156.0000 mg | PREFILLED_SYRINGE | INTRAMUSCULAR | Status: DC
  Administered 2018-12-16: 156 mg via INTRAMUSCULAR
  Filled 2018-12-16: qty 1
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
## 2018-12-16 NOTE — BHH Suicide Risk Assessment (Signed)
[name] INPATIENT:  Family/Significant Other Suicide Prevention Education  Suicide Prevention Education:  Contact Attempts: [place] [employer] DSS, legal guardian [phone]  has been identified by the patient as the family member/significant other with whom the patient will be residing, and identified as the person(s) who will aid the patient in the event of a mental health crisis.  With written consent from the patient, two attempts were made to provide suicide prevention education, prior to and/or following the patient's discharge.  We were unsuccessful in providing suicide prevention education.  A suicide education pamphlet was given to the patient to share with family/significant other.  Date and time of first attempt: 12/16/18 11:05am CSW left confidential vm, awaiting response Date and time of second attempt: 2nd attempt needed  Peter Parsons Peter Parsons 12/16/2018, 11:05 AM

## 2018-12-16 NOTE — Progress Notes (Signed)
Patient alert and oriented x 4, affect is flat but he brightens upon approach. his thoughts are organized and coherent, he appears anxious and apprehensive, writer encouraged  him to use coping skills and he was medicated afterwards. Patient denies SI/HI/AVH, 15 minutes safety checks maintained will continue to monitor.
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
## 2018-12-16 NOTE — Progress Notes (Signed)
Recreation Therapy Notes    Date: 12/16/2018  Time: 9:30 am   Location: Craft room   Behavioral response: N/A   Intervention Topic: Stress  Discussion/Intervention: Patient did not attend group.   Clinical Observations/Feedback:  Patient did not attend group.   Medha Pippen LRT/CTRS        Delanda Bulluck 12/16/2018 11:50 AM

## 2018-12-16 NOTE — BHH Counselor (Signed)
LCSW Group Therapy Note  12/16/2018 1:00 PM  Type of Therapy/Topic:  Group Therapy:  Emotion Regulation  Participation Level:  Did Not Attend   Description of Group:   The purpose of this group is to assist patients in learning to regulate negative emotions and experience positive emotions. Patients will be guided to discuss ways in which they have been vulnerable to their negative emotions. These vulnerabilities will be juxtaposed with experiences of positive emotions or situations, and patients will be challenged to use positive emotions to combat negative ones. Special emphasis will be placed on coping with negative emotions in conflict situations, and patients will process healthy conflict resolution skills.  Therapeutic Goals: 1. Patient will identify two positive emotions or experiences to reflect on in order to balance out negative emotions 2. Patient will label two or more emotions that they find the most difficult to experience 3. Patient will demonstrate positive conflict resolution skills through discussion and/or role plays  Summary of Patient Progress: X  Therapeutic Modalities:   Cognitive Behavioral Therapy Feelings Identification Dialectical Behavioral Therapy  Assunta Curtis, MSW, LCSW 12/16/2018 2:53 PM

## 2018-12-16 NOTE — Plan of Care (Signed)
Patient knowledgeable of information   received  regarding unit programing  able  to verbalize understanding . Patient attending unit  therapy groups . Mental and emotional status improving. Voice no concerns around sleep. Verbalize understanding medication .Denies suicidal ideations . Working on coping , anxiety and decision making .  Problem: Education: Goal: Knowledge of [place] [facility] Education information/materials will improve Outcome: Progressing   Problem: Coping: Goal: Ability to verbalize frustrations and anger appropriately will improve Outcome: Progressing Goal: Ability to demonstrate self-control will improve Outcome: Progressing   Problem: Safety: Goal: Periods of time without injury will increase Outcome: Progressing

## 2018-12-16 NOTE — H&P (Signed)
Psychiatric Admission Assessment Adult  Patient Identification: Peter Parsons MRN:  [id number] Date of Evaluation:  12/16/2018 Chief Complaint:  schizophrenia Principal Diagnosis: Schizophrenia ([place]) Diagnosis:  Principal Problem:   Schizophrenia ([place])  History of Present Illness: Patient seen and chart reviewed.  This is a 58 year old man with a history of schizophrenia who was brought to the hospital after running away from his group home.  As he tells the story it sounds like he ran away from the group home twice within a few days.  He is only been there for a little over a week.  He ran away and thought about killing himself by jumping off a bridge.  He had in an abandoned building but was found by police and taken back to the group home but then ran away again.  Patient describes his mood is anxious and sad.  He had lived with his parents all of his life until placement in this group home which happened just after he had been released from jail.  He was in jail on a charge of assault on a woman, specifically his mother, but he denies having done it.  Patient admits to ongoing auditory hallucinations.  Anxious mood.  Has some paranoid psychotic hyper religious thoughts.  On the other hand he recognizes he has a mental health problem and is asking for antipsychotic medicine.  Denies alcohol or drug abuse.  Denies any current thought of harming himself or anyone else. Associated Signs/Symptoms: Depression Symptoms:  depressed mood, psychomotor retardation, feelings of worthlessness/guilt, hopelessness, suicidal thoughts without plan, (Hypo) Manic Symptoms:  None reported Anxiety Symptoms:  Excessive Worry, Psychotic Symptoms:  Hallucinations: Auditory Paranoia, PTSD Symptoms: Negative Total Time spent with patient: 1 hour  Past Psychiatric History: Patient has a long history of schizophrenia.  Multiple prior hospitalizations.  Multiple medications.  He tells me that the long-acting Invega  injection he had been receiving in the past was most effective.  For many years he had lived with his parents but more recently had been in jail and now on release is being placed in a group home for the first time in his life.  He admits to having had past suicide attempts but the last time he said was several years ago.  He denies ever having been violent.  Is the patient at risk to self? Yes.    Has the patient been a risk to self in the past 6 months? Yes.    Has the patient been a risk to self within the distant past? No.  Is the patient a risk to others? No.  Has the patient been a risk to others in the past 6 months? No.  Has the patient been a risk to others within the distant past? No.   Prior Inpatient Therapy:   Prior Outpatient Therapy:    Alcohol Screening: 1. How often do you have a drink containing alcohol?: Never 2. How many drinks containing alcohol do you have on a typical day when you are drinking?: 1 or 2 3. How often do you have six or more drinks on one occasion?: Never AUDIT-C Score: 0 4. How often during the last year have you found that you were not able to stop drinking once you had started?: Never 5. How often during the last year have you failed to do what was normally expected from you becasue of drinking?: Never 6. How often during the last year have you needed a first drink in the morning to get yourself  going after a heavy drinking session?: Never 7. How often during the last year have you had a feeling of guilt of remorse after drinking?: Never 8. How often during the last year have you been unable to remember what happened the night before because you had been drinking?: Never 9. Have you or someone else been injured as a result of your drinking?: No 10. Has a relative or friend or a doctor or another health worker been concerned about your drinking or suggested you cut down?: No Alcohol Use Disorder Identification Test Final Score (AUDIT): 0 Alcohol Brief  Interventions/Follow-up: AUDIT Score <7 follow-up not indicated Substance Abuse History in the last 12 months:  No. Consequences of Substance Abuse: Negative Previous Psychotropic Medications: Yes  Psychological Evaluations: Yes  Past Medical History:  Past Medical History:  Diagnosis Date  . Diabetes mellitus without complication ([place])   . Hypertension   . Schizophrenia ([place])    History reviewed. No pertinent surgical history. Family History: History reviewed. No pertinent family history. Family Psychiatric  History: Denies knowing of any Tobacco Screening: Have you used any form of tobacco in the last 30 days? (Cigarettes, Smokeless Tobacco, Cigars, and/or Pipes): No Social History:  Social History   Substance and Sexual Activity  Alcohol Use None     Social History   Substance and Sexual Activity  Drug Use Not on file    Additional Social History: Marital status: Divorced Additional relationship information: Pt reports he was married for 5 yrs Does patient have children?: No                         Allergies:   Allergies  Allergen Reactions  . Acetaminophen Other (See Comments)    Unknown Unable to breathe Chest  tightness/SOB    Lab Results:  Results for orders placed or performed during the hospital encounter of 12/15/18 (from the past 48 hour(s))  Hepatic function panel     Status: Abnormal   Collection Time: 12/16/18  6:49 AM  Result Value Ref Range   Total Protein 7.8 6.5 - 8.1 g/dL   Albumin 4.6 3.5 - 5.0 g/dL   AST 23 15 - 41 U/L   ALT 11 0 - 44 U/L   Alkaline Phosphatase 54 38 - 126 U/L   Total Bilirubin 1.1 0.3 - 1.2 mg/dL   Bilirubin, Direct 0.1 0.0 - 0.2 mg/dL   Indirect Bilirubin 1.0 (H) 0.3 - 0.9 mg/dL    Comment: Performed at [facility], [address] [place], [place] [address]  Lipid panel     Status: Abnormal   Collection Time: 12/16/18  6:49 AM  Result Value Ref Range   Cholesterol 110 0 - 200 mg/dL    Triglycerides 77 <150 mg/dL   HDL 40 (L) >40 mg/dL   Total CHOL/HDL Ratio 2.8 RATIO   VLDL 15 0 - 40 mg/dL   LDL Cholesterol 55 0 - 99 mg/dL    Comment:        Total Cholesterol/HDL:CHD Risk Coronary Heart Disease Risk Table                     Men   Women  1/2 Average Risk   3.4   3.3  Average Risk       5.0   4.4  2 X Average Risk   9.6   7.1  3 X Average Risk  23.4   11.0  Use the calculated Patient Ratio above and the CHD Risk Table to determine the patient's CHD Risk.        ATP III CLASSIFICATION (LDL):  <100     mg/dL   Optimal  100-129  mg/dL   Near or Above                    Optimal  130-159  mg/dL   Borderline  160-189  mg/dL   High  >190     mg/dL   Very High Performed at [facility], [address] [place], [place] [address]   TSH     Status: None   Collection Time: 12/16/18  6:49 AM  Result Value Ref Range   TSH 3.592 0.350 - 4.500 uIU/mL    Comment: Performed by a 3rd Generation assay with a functional sensitivity of <=0.01 uIU/mL. Performed at [facility], [address] [place], [place] [address]     Blood Alcohol level:  Lab Results  Component Value Date   [facility] <10 [id number]    Metabolic Disorder Labs:  No results found for: HGBA1C, MPG No results found for: PROLACTIN Lab Results  Component Value Date   CHOL 110 12/16/2018   TRIG 77 12/16/2018   HDL 40 (L) 12/16/2018   CHOLHDL 2.8 12/16/2018   VLDL 15 12/16/2018   LDLCALC 55 12/16/2018    Current Medications: Current Facility-Administered Medications  Medication Dose Route Frequency Provider Last Rate Last Dose  . acetaminophen (TYLENOL) tablet 650 mg  650 mg Oral Q6H PRN Cristofano, Dorene Ar, MD      . alum & mag hydroxide-simeth (MAALOX/MYLANTA) 200-200-20 MG/5ML suspension 30 mL  30 mL Oral Q4H PRN Cristofano, Paul A, MD      . LORazepam (ATIVAN) tablet 1 mg  1 mg Oral Q4H PRN Cristofano, Dorene Ar, MD   1 mg at 12/16/18 1412  . magnesium hydroxide (MILK OF  MAGNESIA) suspension 30 mL  30 mL Oral Daily PRN Cristofano, Paul A, MD      . nicotine (NICODERM CQ - dosed in mg/24 hours) patch 14 mg  14 mg Transdermal Daily Tristy Udovich T, MD      . paliperidone (INVEGA SUSTENNA) injection 156 mg  156 mg Intramuscular Q28 days Clavin Ruhlman, Madie Reno, MD       PTA Medications: Medications Prior to Admission  Medication Sig Dispense Refill Last Dose  . buPROPion (WELLBUTRIN XL) 150 MG 24 hr tablet Take 150 mg by mouth daily.   12/14/2018 at 0800  . gabapentin (NEURONTIN) 300 MG capsule Take 300 mg by mouth 3 (three) times daily.   12/14/2018 at 0800  . KLONOPIN 1 MG tablet Take 1 mg by mouth 3 (three) times daily.   12/14/2018 at 0800  . lisinopril-hydrochlorothiazide (ZESTORETIC) 20-25 MG tablet Take 1 tablet by mouth daily.   12/14/2018 at 0800  . metFORMIN (GLUCOPHAGE) 1000 MG tablet Take 1,000 mg by mouth 2 (two) times daily.   12/14/2018 at 0800  . paliperidone (INVEGA) 6 MG 24 hr tablet Take 6 mg by mouth daily.   12/14/2018 at 0800  . prazosin (MINIPRESS) 1 MG capsule Take 1 mg by mouth at bedtime.   Past Week at Unknown time  . risperiDONE microspheres (RISPERDAL CONSTA) 25 MG injection Inject 25 mg into the muscle every 14 (fourteen) days.   12/13/2018 at Unknown time  . simvastatin (ZOCOR) 20 MG tablet Take 20 mg by mouth daily.   12/14/2018 at 0800  .  tamsulosin (FLOMAX) 0.4 MG CAPS capsule Take 0.4 mg by mouth daily.   12/14/2018 at 0800  . traZODone (DESYREL) 150 MG tablet Take 150 mg by mouth at bedtime as needed.   12/13/2018 at Unknown time  . ZYPREXA 15 MG tablet Take 15 mg by mouth at bedtime.   12/13/2018 at Unknown time    Musculoskeletal: Strength & Muscle Tone: within normal limits Gait & Station: normal Patient leans: N/A  Psychiatric Specialty Exam: Physical Exam  Nursing note and vitals reviewed. Constitutional: He appears well-developed and well-nourished.  HENT:  Head: Normocephalic and atraumatic.  Eyes: Pupils are equal, round, and  reactive to light. Conjunctivae are normal.  Neck: Normal range of motion.  Cardiovascular: Regular rhythm and normal heart sounds.  Respiratory: Effort normal. No respiratory distress.  GI: Soft.  Musculoskeletal: Normal range of motion.  Neurological: He is alert.  Skin: Skin is warm and dry.  Psychiatric: His affect is blunt. His speech is delayed. He is slowed. Thought content is paranoid. Cognition and memory are normal. He expresses impulsivity. He expresses no homicidal and no suicidal ideation.    Review of Systems  Constitutional: Negative.   HENT: Negative.   Eyes: Negative.   Respiratory: Negative.   Cardiovascular: Negative.   Gastrointestinal: Negative.   Musculoskeletal: Negative.   Skin: Negative.   Neurological: Negative.   Psychiatric/Behavioral: Positive for depression and hallucinations. Negative for substance abuse and suicidal ideas. The patient is nervous/anxious and has insomnia.     Blood pressure (!) 163/93, pulse 91, temperature 98.4 F (36.9 C), temperature source Oral, resp. rate 17, height 5\' 10"  (1.778 m), weight 90.3 kg, SpO2 99 %.Body mass index is 28.55 kg/m.  General Appearance: Casual  Eye Contact:  Fair  Speech:  Slow  Volume:  Decreased  Mood:  Dysphoric  Affect:  Constricted  Thought Process:  Coherent  Orientation:  Full (Time, Place, and Person)  Thought Content:  Paranoid Ideation and Rumination  Suicidal Thoughts:  No  Homicidal Thoughts:  No  Memory:  Immediate;   Fair Recent;   Fair Remote;   Fair  Judgement:  Fair  Insight:  Fair  Psychomotor Activity:  Decreased  Concentration:  Concentration: Fair  Recall:  [employer] of Knowledge:  Fair  Language:  Fair  Akathisia:  No  Handed:  Right  AIMS (if indicated):     Assets:  Desire for Improvement Housing Physical Health Social Support  ADL's:  Intact  Cognition:  Impaired,  Mild  Sleep:  Number of Hours: 7.5    Treatment Plan Summary: Daily contact with patient to  assess and evaluate symptoms and progress in treatment, Medication management and Plan Patient will get a 156 mg in [place] injection today and continue other anti-patient will get a 156 mg Invega injection today and continue other antipsychotic and psychiatric medicines as usual.  Restart blood pressure medicine.  Monitor behavior and mood in the hospital.  Ongoing reassessment to make sure he is stabilized and has good outpatient treatment prior to discharge.  Observation Level/Precautions:  15 minute checks  Laboratory:  Chemistry Profile  Psychotherapy:    Medications:    Consultations:    Discharge Concerns:    Estimated LOS:  Other:     Physician Treatment Plan for Primary Diagnosis: Schizophrenia ([place]) Long Term Goal(s): Improvement in symptoms so as ready for discharge  Short Term Goals: Ability to maintain clinical measurements within normal limits will improve and Compliance with prescribed medications will improve  Physician Treatment Plan for Secondary Diagnosis: Principal Problem:   Schizophrenia ([place])  Long Term Goal(s): Improvement in symptoms so as ready for discharge  Short Term Goals: Ability to disclose and discuss suicidal ideas and Ability to demonstrate self-control will improve  I certify that inpatient services furnished can reasonably be expected to improve the patient's condition.    Alethia Berthold, MD 11/4/20204:10 PM

## 2018-12-16 NOTE — [facility] Group Notes (Signed)
[facility] Group Notes:  (Nursing/MHT/Case Management/Adjunct)  Date:  12/16/2018  Time:  8:56 PM  Type of Therapy:  Group Therapy  Participation Level:  Active  Participation Quality:  Appropriate  Affect:  Appropriate  Cognitive:  Alert  Insight:  Good  Engagement in Group:  Engaged  Modes of Intervention:  Support  Summary of Progress/Problems:  Peter Parsons 12/16/2018, 8:56 PM

## 2018-12-16 NOTE — Progress Notes (Signed)
D:Schizophrenia  A: Patient stated slept good last night .Stated appetite is good and energy level  Is normal. Stated concentration is good . Stated on Depression scale  10, hopeless 10 and anxiety 10 .( low 0-10 high) Passive  suicidal  ideations .  No auditory hallucinations  No pain concerns . Appropriate ADL'S. Interacting with peers and staff. Patient knowledgeable of information   received  regarding unit programing  able  to verbalize understanding . Patient attending unit  therapy groups . Mental and emotional status improving. Voice no concerns around sleep. Verbalize understanding medication .Denies suicidal ideations . Working on coping , anxiety and decision making . Motor retardation, Isolation to room  Voice of being sleepy , stated the medications  Are  Doing this .  Encourage patient participation with unit programming . Instruction  Given on  Medication , verbalize understanding.  R: Voice no other concerns. Staff continue to monitor

## 2018-12-17 MED ORDER — PALIPERIDONE PALMITATE ER 156 MG/ML IM SUSY
156.0000 mg | PREFILLED_SYRINGE | Freq: Once | INTRAMUSCULAR | Status: AC
  Administered 2018-12-17: 156 mg via INTRAMUSCULAR
  Filled 2018-12-17 (×2): qty 1

## 2018-12-17 MED ORDER — PALIPERIDONE PALMITATE ER 234 MG/1.5ML IM SUSY: 234.0000 mg | PREFILLED_SYRINGE | INTRAMUSCULAR | Status: DC

## 2018-12-17 NOTE — Plan of Care (Signed)
Patient newly admitted, hasn't had time to progress  Problem: Education: Goal: Knowledge of [facility] Education information/materials will improve Outcome: Not Progressing   Problem: Coping: Goal: Ability to verbalize frustrations and anger appropriately will improve Outcome: Not Progressing Goal: Ability to demonstrate self-control will improve Outcome: Not Progressing   Problem: Safety: Goal: Periods of time without injury will increase Outcome: Not Progressing

## 2018-12-17 NOTE — Progress Notes (Signed)
D - Patient was in his room upon arrival to the unit. Patient was pleasant during assessment denying SI/HI/AVH and pain. Patient endorses anxiety and depression. Patient was paranoid and isolative to his room this evening.   A - Patient compliant with medication administration per MD orders. Patient given education. Patient given support and encouragement to be active in his treatment plan. Patient informed to let staff know if there are any issues or problems on the unit.   R - Patient being monitored Q 15 minutes for safety per unit protocol. Patient remains safe on the unit.

## 2018-12-17 NOTE — Progress Notes (Signed)
[facility] MD Progress Note  12/17/2018 11:11 AM Peter Parsons  MRN:  [id number]   Subjective: This is a follow-up on the patient diagnosed with schizophrenia.  Patient reports today that he feels very anxious and concerned because he continues to want to remove all of his clothes in front of women.  Patient reports that this is due to auditory hallucinations that are telling him to do this.  He also reports that he has some vague thoughts of wanting to harm himself but these are due to the auditory hallucinations as well.  He states that he does not want to hurt himself and he understands that he should not take his clothes off in front of women because it is not a good thing.  He states that he also does not want to do this because he is gay and he does not want to be naked in front of women at all.  Patient continues to stay fixated on having to remove his clothes in front of women.  He states that he has chosen to stay in his room today instead of attending groups and been in the day room because he is afraid that he will start taking his clothes off.  He denies any homicidal ideations.  He denies any medication side effects.  He also states that his sleep has been moderate and that his appetite has been okay.  Principal Problem: Schizophrenia ([place]) Diagnosis: Principal Problem:   Schizophrenia ([place])  Total Time spent with patient: 30 minutes  Past Psychiatric History: Patient has a long history of schizophrenia.  Multiple prior hospitalizations.  Multiple medications.  He tells me that the long-acting Invega injection he had been receiving in the past was most effective.  For many years he had lived with his parents but more recently had been in jail and now on release is being placed in a group home for the first time in his life.  He admits to having had past suicide attempts but the last time he said was several years ago.  He denies ever having been violent.  Past Medical History:  Past Medical  History:  Diagnosis Date  . Diabetes mellitus without complication ([place])   . Hypertension   . Schizophrenia ([place])    History reviewed. No pertinent surgical history. Family History: History reviewed. No pertinent family history. Family Psychiatric  History: None known Social History:  Social History   Substance and Sexual Activity  Alcohol Use None     Social History   Substance and Sexual Activity  Drug Use Not on file    Social History   Socioeconomic History  . Marital status: Single    Spouse name: Not on file  . Number of children: Not on file  . Years of education: Not on file  . Highest education level: Not on file  Occupational History  . Not on file  Social Needs  . Financial resource strain: Not on file  . Food insecurity    Worry: Not on file    Inability: Not on file  . Transportation needs    Medical: Not on file    Non-medical: Not on file  Tobacco Use  . Smoking status: Never Smoker  . Smokeless tobacco: Never Used  Substance and Sexual Activity  . Alcohol use: Not on file  . Drug use: Not on file  . Sexual activity: Not on file  Lifestyle  . Physical activity    Days per week: Not on file  Minutes per session: Not on file  . Stress: Not on file  Relationships  . Social Herbalist on phone: Not on file    Gets together: Not on file    Attends religious service: Not on file    Active member of club or organization: Not on file    Attends meetings of clubs or organizations: Not on file    Relationship status: Not on file  Other Topics Concern  . Not on file  Social History Narrative  . Not on file   Additional Social History:                         Sleep: Fair  Appetite:  Fair  Current Medications: Current Facility-Administered Medications  Medication Dose Route Frequency Provider Last Rate Last Dose  . acetaminophen (TYLENOL) tablet 650 mg  650 mg Oral Q6H PRN Cristofano, Dorene Ar, MD      . alum & mag  hydroxide-simeth (MAALOX/MYLANTA) 200-200-20 MG/5ML suspension 30 mL  30 mL Oral Q4H PRN Cristofano, Paul A, MD      . LORazepam (ATIVAN) tablet 1 mg  1 mg Oral Q4H PRN Cristofano, Dorene Ar, MD   1 mg at 12/16/18 2118  . magnesium hydroxide (MILK OF MAGNESIA) suspension 30 mL  30 mL Oral Daily PRN Cristofano, Paul A, MD      . nicotine (NICODERM CQ - dosed in mg/24 hours) patch 14 mg  14 mg Transdermal Daily Clapacs, Madie Reno, MD   14 mg at 12/16/18 1655  . paliperidone (INVEGA SUSTENNA) injection 156 mg  156 mg Intramuscular Once Kyliah Deanda, Lowry Ram, FNP      . traZODone (DESYREL) tablet 100 mg  100 mg Oral QHS PRN Caroline Sauger, NP   100 mg at 12/16/18 2117    Lab Results:  Results for orders placed or performed during the hospital encounter of 12/15/18 (from the past 48 hour(s))  Hepatic function panel     Status: Abnormal   Collection Time: 12/16/18  6:49 AM  Result Value Ref Range   Total Protein 7.8 6.5 - 8.1 g/dL   Albumin 4.6 3.5 - 5.0 g/dL   AST 23 15 - 41 U/L   ALT 11 0 - 44 U/L   Alkaline Phosphatase 54 38 - 126 U/L   Total Bilirubin 1.1 0.3 - 1.2 mg/dL   Bilirubin, Direct 0.1 0.0 - 0.2 mg/dL   Indirect Bilirubin 1.0 (H) 0.3 - 0.9 mg/dL    Comment: Performed at [facility], [address] [place], [place] [address]  Lipid panel     Status: Abnormal   Collection Time: 12/16/18  6:49 AM  Result Value Ref Range   Cholesterol 110 0 - 200 mg/dL   Triglycerides 77 <150 mg/dL   HDL 40 (L) >40 mg/dL   Total CHOL/HDL Ratio 2.8 RATIO   VLDL 15 0 - 40 mg/dL   LDL Cholesterol 55 0 - 99 mg/dL    Comment:        Total Cholesterol/HDL:CHD Risk Coronary Heart Disease Risk Table                     Men   Women  1/2 Average Risk   3.4   3.3  Average Risk       5.0   4.4  2 X Average Risk   9.6   7.1  3 X Average Risk  23.4   11.0  Use the calculated Patient Ratio above and the CHD Risk Table to determine the patient's CHD Risk.        ATP III CLASSIFICATION  (LDL):  <100     mg/dL   Optimal  100-129  mg/dL   Near or Above                    Optimal  130-159  mg/dL   Borderline  160-189  mg/dL   High  >190     mg/dL   Very High Performed at [facility], [address] [place], [place] [address]   TSH     Status: None   Collection Time: 12/16/18  6:49 AM  Result Value Ref Range   TSH 3.592 0.350 - 4.500 uIU/mL    Comment: Performed by a 3rd Generation assay with a functional sensitivity of <=0.01 uIU/mL. Performed at [facility], [address] [place],  [address]     Blood Alcohol level:  Lab Results  Component Value Date   [facility] <10 [id number]    Metabolic Disorder Labs: No results found for: HGBA1C, MPG No results found for: PROLACTIN Lab Results  Component Value Date   CHOL 110 12/16/2018   TRIG 77 12/16/2018   HDL 40 (L) 12/16/2018   CHOLHDL 2.8 12/16/2018   VLDL 15 12/16/2018   LDLCALC 55 12/16/2018    Physical Findings: AIMS:  , ,  ,  ,    CIWA:    COWS:     Musculoskeletal: Strength & Muscle Tone: within normal limits Gait & Station: normal Patient leans: N/A  Psychiatric Specialty Exam: Physical Exam  Nursing note and vitals reviewed. Constitutional: He is oriented to person, place, and time. He appears well-developed and well-nourished.  Respiratory: Effort normal.  Musculoskeletal: Normal range of motion.  Neurological: He is alert and oriented to person, place, and time.  Skin: Skin is warm.    Review of Systems  Constitutional: Negative.   HENT: Negative.   Eyes: Negative.   Respiratory: Negative.   Cardiovascular: Negative.   Gastrointestinal: Negative.   Genitourinary: Negative.   Musculoskeletal: Negative.   Skin: Negative.   Neurological: Negative.   Endo/Heme/Allergies: Negative.   Psychiatric/Behavioral: Positive for depression, hallucinations and suicidal ideas (vague). The patient is nervous/anxious.     Blood pressure (!) 151/91, pulse (!) 114,  temperature 98.9 F (37.2 C), temperature source Oral, resp. rate 18, height 5\' 10"  (1.778 m), weight 90.3 kg, SpO2 98 %.Body mass index is 28.55 kg/m.  General Appearance: Disheveled and Guarded  Eye Contact:  Good  Speech:  Clear and Coherent and Slow  Volume:  Decreased  Mood:  Anxious and Depressed  Affect:  Constricted  Thought Process:  Linear and Descriptions of Associations: Loose  Orientation:  Full (Time, Place, and Person)  Thought Content:  Hallucinations: Command:  auditory telling him to hurt himself and to take all of his clothes off in front of a woman and Paranoid Ideation  Suicidal Thoughts:  Yes.  without intent/plan  Homicidal Thoughts:  No  Memory:  Immediate;   Fair Recent;   Fair Remote;   Fair  Judgement:  Impaired  Insight:  Fair  Psychomotor Activity:  Decreased  Concentration:  Concentration: Fair  Recall:  [employer] of Knowledge:  Fair  Language:  Fair  Akathisia:  No  Handed:  Right  AIMS (if indicated):     Assets:  Desire for Improvement Financial Resources/Insurance Housing Resilience Social Support  ADL's:  Intact  Cognition:  WNL  Sleep:  Number of Hours: 7.25   Assessment: Patient presents in his room and is pacing.  Patient presents with a flat constricted affect and appears to be anxious and concerned about removing his clothes.  The patient continues discussing about having to remove his close in front of women and how it is bad and that he is gay and he should not want to do that.  He also reports that these thoughts are due to auditory hallucinations.  Patient states that [facility] is with [facility].  Cecilie Lowers at [facility] team was contacted and he let me speak to the nurse there and they are going to fax over an updated medication list.  I consulted with Dr. Weber Cooks and found that the patient is to receive another [place] 156 mg IM injection today to assist with stabilization as the patient is  supposed to be on [place] 234 mg IM q. 28 days.  Waiting for fax from [facility] to update the [facility].  Treatment Plan Summary: Daily contact with patient to assess and evaluate symptoms and progress in treatment and Medication management Invega [place] 156 mg IM injection once today for schizophrenia Continue trazodone 100 mg p.o. nightly as needed for insomnia Continue Ativan 1 mg p.o. every 4 hours as needed for anxiety Waiting for updated medication list from [facility] Continue every 15 minute safety checks Encourage patient to attend groups once he feels as he can safely attend groups and maintain clothes on  Lewis Shock, FNP 12/17/2018, 11:11 AM

## 2018-12-17 NOTE — BHH Counselor (Addendum)
CSW spoke with Ms. Peter Parsons who reports the pt returning to her group home is "questionable." She states she would like to meet with the pt and see medication adjustments made before she will make a decision on whether or not he can return. CSW relayed this information to the legal guardian and ACTT team leader.

## 2018-12-17 NOTE — Progress Notes (Signed)
Recreation Therapy Notes  Date: 12/17/2018  Time: 9:30 am   Location: Craft room   Behavioral response: N/A   Intervention Topic: Decision making  Discussion/Intervention: Patient did not attend group.   Clinical Observations/Feedback:  Patient did not attend group.   Kaeo Jacome LRT/CTRS        Jennalyn Cawley 12/17/2018 11:01 AM

## 2018-12-17 NOTE — BHH Counselor (Signed)
CSW called and left confidential vm for  Ms. Peter Parsons, owner at [facility]([phone]) to see if the pt can return to the facility. Pt's legal guardian states the pt may not be able to return due to him leaving the home several times without permission. Awaiting response from Ms. Peter Parsons. CSW spoke with Peter Parsons([facility] ACT lead) who reports "he can return to the group home Ive convinced the owner to re-evaluate his return toward the end of his hospitalization." Peter Parsons reports the pt left the group home two times resulting in a silver alert both times. He states when the pt wanders off he goes to his parents house. According to Peter Parsons, the pt experiences command hallucinations that tell him to harm his parents. CSW spoke with Peter Parsons, pt care coordinator at [facility] [phone]. Ms. Peter Parsons encouraged CSW to contact her if assistance is needed with placing pt at another group home.

## 2018-12-17 NOTE — Progress Notes (Signed)
Recreation Therapy Notes  INPATIENT RECREATION THERAPY ASSESSMENT  Patient Details Name: Peter Parsons MRN: [id number] DOB: Jan 23, 1961 Today's Date: 12/17/2018       Information Obtained From: Patient  Able to Participate in Assessment/Interview: Yes  Patient Presentation: Responsive  Reason for Admission (Per Patient): Active Symptoms  Patient Stressors:    Coping Skills:   Art  Leisure Interests (2+):  (Social worker)  Frequency of Recreation/Participation: Monthly  Awareness of Community Resources:     [employer]:     Current Use:    If no, Barriers?:    Expressed Interest in [employer] Information:    [employer] of Residence:  Person  Patient Main Form of Transportation: (Group home)  Patient Strengths:  Care about others  Patient Identified Areas of Improvement:  Mythoughts  Patient Goal for Hospitalization:  To get to the point where I can live in a group home successfully  Current SI (including self-harm):  No  Current HI:  No  Current AVH: No  Staff Intervention Plan: Group Attendance, Collaborate with Interdisciplinary Treatment Team  Consent to Intern Participation: N/A  Dravin Lance 12/17/2018, 2:35 PM

## 2018-12-17 NOTE — BHH Group Notes (Signed)
Balance In Life 12/17/2018 1PM  Type of Therapy/Topic:  Group Therapy:  Balance in Life  Participation Level:  Active  Description of Group:   This group will address the concept of balance and how it feels and looks when one is unbalanced. Patients will be encouraged to process areas in their lives that are out of balance and identify reasons for remaining unbalanced. Facilitators will guide patients in utilizing problem-solving interventions to address and correct the stressor making their life unbalanced. Understanding and applying boundaries will be explored and addressed for obtaining and maintaining a balanced life. Patients will be encouraged to explore ways to assertively make their unbalanced needs known to significant others in their lives, using other group members and facilitator for support and feedback.  Therapeutic Goals: 1. Patient will identify two or more emotions or situations they have that consume much of in their lives. 2. Patient will identify signs/triggers that life has become out of balance:  3. Patient will identify two ways to set boundaries in order to achieve balance in their lives:  4. Patient will demonstrate ability to communicate their needs through discussion and/or role plays  Summary of Patient Progress: Actively and appropriately participated in todays session. Pt defined balance as "normal" and then stated "Im not normal." Pt discussed with group wanting to get undressed but was able to be redirected and proceeded to engage appropriately with group members. Pt identified getting a manicure and facial as healthy coping method to find balance in life.   Therapeutic Modalities:   Cognitive Behavioral Therapy Solution-Focused Therapy Assertiveness Training  Matisse Salais Lynelle Smoke, LCSW

## 2018-12-17 NOTE — Plan of Care (Signed)
D- Patient alert and oriented. Patient presents in an anxious, but pleasant mood on assessment stating that he slept good last night and had no complaints to voice to this Probation officer. Patient endorsed AH, depression, and anxiety stating that "the voices telling me to expose myself to women", and "feeling that I can't control myself" is why he's feeling this way. Patient denies SI, HI, VH, and pain at this time. Patient's goal for today is "learning how to live at group home".  A- Scheduled medications administered to patient, per MD orders. Support and encouragement provided.  Routine safety checks conducted every 15 minutes.  Patient informed to notify staff with problems or concerns.  R- No adverse drug reactions noted. Patient contracts for safety at this time. Patient compliant with medications and treatment plan. Patient receptive, calm, and cooperative. Patient interacts well with others on the unit.  Patient remains safe at this time.  Problem: Education: Goal: Knowledge of [place] [facility] Education information/materials will improve Outcome: Progressing   Problem: Coping: Goal: Ability to verbalize frustrations and anger appropriately will improve Outcome: Progressing Goal: Ability to demonstrate self-control will improve Outcome: Progressing   Problem: Safety: Goal: Periods of time without injury will increase Outcome: Progressing

## 2018-12-17 NOTE — Tx Team (Addendum)
Interdisciplinary Treatment and Diagnostic Plan Update  12/17/2018 Time of Session: 9AM Peter Parsons MRN: [id number]  Principal Diagnosis: Schizophrenia [facility])  Secondary Diagnoses: Principal Problem:   Schizophrenia ([place])   Current Medications:  Current Facility-Administered Medications  Medication Dose Route Frequency Provider Last Rate Last Dose  . acetaminophen (TYLENOL) tablet 650 mg  650 mg Oral Q6H PRN Cristofano, Dorene Ar, MD      . alum & mag hydroxide-simeth (MAALOX/MYLANTA) 200-200-20 MG/5ML suspension 30 mL  30 mL Oral Q4H PRN Cristofano, Paul A, MD      . LORazepam (ATIVAN) tablet 1 mg  1 mg Oral Q4H PRN Cristofano, Dorene Ar, MD   1 mg at 12/17/18 1218  . magnesium hydroxide (MILK OF MAGNESIA) suspension 30 mL  30 mL Oral Daily PRN Cristofano, Paul A, MD      . nicotine (NICODERM CQ - dosed in mg/24 hours) patch 14 mg  14 mg Transdermal Daily Clapacs, Madie Reno, MD   14 mg at 12/17/18 1218  . paliperidone (INVEGA SUSTENNA) injection 156 mg  156 mg Intramuscular Once Money, Lowry Ram, FNP      . [START ON 01/14/2019] paliperidone (INVEGA SUSTENNA) injection 234 mg  234 mg Intramuscular Q28 days Money, Lowry Ram, FNP      . traZODone (DESYREL) tablet 100 mg  100 mg Oral QHS PRN Caroline Sauger, NP   100 mg at 12/16/18 2117   PTA Medications: Medications Prior to Admission  Medication Sig Dispense Refill Last Dose  . buPROPion (WELLBUTRIN XL) 150 MG 24 hr tablet Take 150 mg by mouth daily.   12/14/2018 at 0800  . gabapentin (NEURONTIN) 300 MG capsule Take 300 mg by mouth 3 (three) times daily.   12/14/2018 at 0800  . KLONOPIN 1 MG tablet Take 1 mg by mouth 3 (three) times daily.   12/14/2018 at 0800  . lisinopril-hydrochlorothiazide (ZESTORETIC) 20-25 MG tablet Take 1 tablet by mouth daily.   12/14/2018 at 0800  . metFORMIN (GLUCOPHAGE) 1000 MG tablet Take 1,000 mg by mouth 2 (two) times daily.   12/14/2018 at 0800  . paliperidone (INVEGA) 6 MG 24 hr tablet Take 6 mg by mouth daily.    12/14/2018 at 0800  . prazosin (MINIPRESS) 1 MG capsule Take 1 mg by mouth at bedtime.   Past Week at Unknown time  . risperiDONE microspheres (RISPERDAL CONSTA) 25 MG injection Inject 25 mg into the muscle every 14 (fourteen) days.   12/13/2018 at Unknown time  . simvastatin (ZOCOR) 20 MG tablet Take 20 mg by mouth daily.   12/14/2018 at 0800  . tamsulosin (FLOMAX) 0.4 MG CAPS capsule Take 0.4 mg by mouth daily.   12/14/2018 at 0800  . traZODone (DESYREL) 150 MG tablet Take 150 mg by mouth at bedtime as needed.   12/13/2018 at Unknown time  . ZYPREXA 15 MG tablet Take 15 mg by mouth at bedtime.   12/13/2018 at Unknown time    Patient Stressors: Financial difficulties Legal issue Medication change or noncompliance  Patient Strengths: Ability for insight Communication skills  Treatment Modalities: Medication Management, Group therapy, Case management,  1 to 1 session with clinician, Psychoeducation, Recreational therapy.   Physician Treatment Plan for Primary Diagnosis: Schizophrenia ([place]) Long Term Goal(s): Improvement in symptoms so as ready for discharge Improvement in symptoms so as ready for discharge   Short Term Goals: Ability to maintain clinical measurements within normal limits will improve Compliance with prescribed medications will improve Ability to disclose and discuss suicidal ideas Ability  to demonstrate self-control will improve  Medication Management: Evaluate patient's response, side effects, and tolerance of medication regimen.  Therapeutic Interventions: 1 to 1 sessions, Unit Group sessions and Medication administration.  Evaluation of Outcomes: Not Met  Physician Treatment Plan for Secondary Diagnosis: Principal Problem:   Schizophrenia ([place])  Long Term Goal(s): Improvement in symptoms so as ready for discharge Improvement in symptoms so as ready for discharge   Short Term Goals: Ability to maintain clinical measurements within normal limits will  improve Compliance with prescribed medications will improve Ability to disclose and discuss suicidal ideas Ability to demonstrate self-control will improve     Medication Management: Evaluate patient's response, side effects, and tolerance of medication regimen.  Therapeutic Interventions: 1 to 1 sessions, Unit Group sessions and Medication administration.  Evaluation of Outcomes: Not Met   RN Treatment Plan for Primary Diagnosis: Schizophrenia ([place]) Long Term Goal(s): Knowledge of disease and therapeutic regimen to maintain health will improve  Short Term Goals: Ability to demonstrate self-control, Ability to participate in decision making will improve, Ability to verbalize feelings will improve and Ability to identify and develop effective coping behaviors will improve  Medication Management: RN will administer medications as ordered by provider, will assess and evaluate patient's response and provide education to patient for prescribed medication. RN will report any adverse and/or side effects to prescribing provider.  Therapeutic Interventions: 1 on 1 counseling sessions, Psychoeducation, Medication administration, Evaluate responses to treatment, Monitor vital signs and CBGs as ordered, Perform/monitor CIWA, COWS, AIMS and Fall Risk screenings as ordered, Perform wound care treatments as ordered.  Evaluation of Outcomes: Not Met   LCSW Treatment Plan for Primary Diagnosis: Schizophrenia ([place]) Long Term Goal(s): Safe transition to appropriate next level of care at discharge, Engage patient in therapeutic group addressing interpersonal concerns.  Short Term Goals: Engage patient in aftercare planning with referrals and resources, Increase social support, Increase ability to appropriately verbalize feelings, Increase emotional regulation, Facilitate acceptance of mental health diagnosis and concerns and Increase skills for wellness and recovery  Therapeutic Interventions: Assess for  all discharge needs, 1 to 1 time with Social worker, Explore available resources and support systems, Assess for adequacy in community support network, Educate family and significant other(s) on suicide prevention, Complete Psychosocial Assessment, Interpersonal group therapy.  Evaluation of Outcomes: Not Met   Progress in Treatment: Attending groups: No. Participating in groups: No. Taking medication as prescribed: Yes. Toleration medication: Yes. Family/Significant other contact made: No, will contact:  once permission given Patient understands diagnosis: No. Discussing patient identified problems/goals with staff: Yes. Medical problems stabilized or resolved: Yes. Denies suicidal/homicidal ideation: Yes. Issues/concerns per patient self-inventory: No. Other: none  New problem(s) identified: No, Describe:  none  New Short Term/Long Term Goal(s): detox, elimination of symptoms of psychosis, medication management for mood stabilization; elimination of SI thoughts; development of comprehensive mental wellness/sobriety plan.  Patient Goals:  "I just want to get to a place where I can live in the group home successfully"  Discharge Plan or Barriers: Pt will follow up with his ACT team with [facility].  CSW is still assessing if patient can return to the group home.   Reason for Continuation of Hospitalization: Anxiety Depression Mania Medical Issues Suicidal ideation  Estimated Length of Stay: 1-7 days  Recreational Therapy: Patient: N/A Patient Goal: Patient will engage in groups without prompting or encouragement from LRT x3 group sessions within 5 recreation therapy group sessions  Attendees: Patient: Peter Parsons 12/17/2018 1:15 PM  Physician: Dr. Weber Cooks,  MD 12/17/2018 1:15 PM  Nursing: Lyda Kalata, RN 12/17/2018 1:15 PM  RN Care Manager: 12/17/2018 1:15 PM  Social Worker: Assunta Curtis, LCSW 12/17/2018 1:15 PM  Recreational Therapist: Devin Going, LRT  12/17/2018 1:15 PM  Other: Sanjuana Kava, LCSW 12/17/2018 1:15 PM  Other: Evalina Field, LCSW 12/17/2018 1:15 PM  Other: 12/17/2018 1:15 PM    Scribe for Treatment Team: Rozann Lesches, LCSW 12/17/2018 1:15 PM

## 2018-12-17 NOTE — [facility] Suicide Risk Assessment (Signed)
[place] INPATIENT:  Family/Significant Other Suicide Prevention Education  Suicide Prevention Education:  Education Completed; [name], legal guardian [place] DSS [phone]) has been identified by the patient as the family member/significant other with whom the patient will be residing, and identified as the person(s) who will aid the patient in the event of a mental health crisis (suicidal ideations/suicide attempt).  With written consent from the patient, the family member/significant other has been provided the following suicide prevention education, prior to the and/or following the discharge of the patient.  The suicide prevention education provided includes the following:  Suicide risk factors  Suicide prevention and interventions  National Suicide Hotline telephone number  [facility] assessment telephone number  [facility] Emergency Assistance [place] and/or Residential Mobile Crisis Unit telephone number  Request made of family/significant other to:  Remove weapons (e.g., guns, rifles, knives), all items previously/currently identified as safety concern.    Remove drugs/medications (over-the-counter, prescriptions, illicit drugs), all items previously/currently identified as a safety concern.  The family member/significant other verbalizes understanding of the suicide prevention education information provided.  The family member/significant other agrees to remove the items of safety concern listed above. Ms. Eulas Post reports the pt was living at [facility] in [place] but might not be able to return to the home. She states the pt had been living there for about a week and is not allowed to return due to him leaving the home without permission. Per Ms. Eulas Post, the pt ACT Team is working on finding him housing. She denies the pt having access to guns or weapons.   Aranda Bihm T Jeramey Lanuza 12/17/2018, 2:13 PM

## 2018-12-18 MED ORDER — METFORMIN HCL 500 MG PO TABS
500.0000 mg | ORAL_TABLET | Freq: Two times a day (BID) | ORAL | Status: DC
  Administered 2018-12-18 – 2019-01-05 (×37): 500 mg via ORAL
  Filled 2018-12-18 (×37): qty 1

## 2018-12-18 MED ORDER — CLONAZEPAM 1 MG PO TABS
1.0000 mg | ORAL_TABLET | Freq: Two times a day (BID) | ORAL | Status: DC
  Administered 2018-12-18 – 2018-12-19 (×2): 1 mg via ORAL
  Filled 2018-12-18 (×2): qty 1

## 2018-12-18 MED ORDER — GABAPENTIN 300 MG PO CAPS
300.0000 mg | ORAL_CAPSULE | Freq: Three times a day (TID) | ORAL | Status: DC
  Administered 2018-12-18 – 2018-12-26 (×24): 300 mg via ORAL
  Filled 2018-12-18 (×24): qty 1

## 2018-12-18 MED ORDER — ESCITALOPRAM OXALATE 10 MG PO TABS
10.0000 mg | ORAL_TABLET | Freq: Every day | ORAL | Status: DC
  Administered 2018-12-18 – 2018-12-20 (×3): 10 mg via ORAL
  Filled 2018-12-18 (×3): qty 1

## 2018-12-18 MED ORDER — BENZTROPINE MESYLATE 1 MG PO TABS
0.5000 mg | ORAL_TABLET | Freq: Two times a day (BID) | ORAL | Status: DC
  Administered 2018-12-18 – 2019-01-03 (×32): 0.5 mg via ORAL
  Filled 2018-12-18 (×32): qty 1

## 2018-12-18 MED ORDER — LISINOPRIL 20 MG PO TABS
20.0000 mg | ORAL_TABLET | Freq: Every day | ORAL | Status: DC
  Administered 2018-12-18 – 2019-01-05 (×16): 20 mg via ORAL
  Filled 2018-12-18 (×19): qty 1

## 2018-12-18 NOTE — Progress Notes (Signed)
[facility] MD Progress Note  12/18/2018 3:43 PM Peter Parsons  MRN:  [id number] Subjective: Follow-up for this gentleman with schizophrenia.  Met with the patient reviewed the chart including his old notes.  The patient still feels in a lot of distress today.  He tells me that he continues to have impulses to take his clothing off even though he admits there is no rational reason for that and the feeling is itself dysphoric.  He says he still did not sleep very well last night.  He denies actually having acute hallucinations today but feels nervous and paranoid.  Additionally I noticed that he is having symptoms of akathisia.  He could not stay seated in my office for more than a few seconds without getting out of his chair.  When I ask him about it he clearly endorsed what sounds like akathisia.  I went back and reviewed his notes and saw that he had been on quite a bit more medication in the past and what we have been giving him so far.  Also a lot of the things about feeling like he is compelled to take off his clothing when he does not actually want to do it sounds very obsessive-compulsive to me.  He does endorse passive suicidal thoughts but has no intent of acting on it.  Principal Problem: Schizophrenia (HCC) Diagnosis: Principal Problem:   Schizophrenia (HCC)  Total Time spent with patient: 30 minutes  Past Psychiatric History: Patient has a long history of chronic mental illness.  Evidently had lived with his parents pretty much his whole life although recently seems to have decompensated and then spent some time in jail and now is in a group home for the first time.  We do not have many details but we do have a medication list  Past Medical History:  Past Medical History:  Diagnosis Date  . Diabetes mellitus without complication (HCC)   . Hypertension   . Schizophrenia (HCC)    History reviewed. No pertinent surgical history. Family History: History reviewed. No pertinent family history. Family  Psychiatric  History: See previous Social History:  Social History   Substance and Sexual Activity  Alcohol Use None     Social History   Substance and Sexual Activity  Drug Use Not on file    Social History   Socioeconomic History  . Marital status: Single    Spouse name: Not on file  . Number of children: Not on file  . Years of education: Not on file  . Highest education level: Not on file  Occupational History  . Not on file  Social Needs  . Financial resource strain: Not on file  . Food insecurity    Worry: Not on file    Inability: Not on file  . Transportation needs    Medical: Not on file    Non-medical: Not on file  Tobacco Use  . Smoking status: Never Smoker  . Smokeless tobacco: Never Used  Substance and Sexual Activity  . Alcohol use: Not on file  . Drug use: Not on file  . Sexual activity: Not on file  Lifestyle  . Physical activity    Days per week: Not on file    Minutes per session: Not on file  . Stress: Not on file  Relationships  . Social connections    Talks on phone: Not on file    Gets together: Not on file    Attends religious service: Not on file      Active member of club or organization: Not on file    Attends meetings of clubs or organizations: Not on file    Relationship status: Not on file  Other Topics Concern  . Not on file  Social History Narrative  . Not on file   Additional Social History:                         Sleep: Fair  Appetite:  Fair  Current Medications: Current Facility-Administered Medications  Medication Dose Route Frequency Provider Last Rate Last Dose  . acetaminophen (TYLENOL) tablet 650 mg  650 mg Oral Q6H PRN Cristofano, Paul A, MD      . alum & mag hydroxide-simeth (MAALOX/MYLANTA) 200-200-20 MG/5ML suspension 30 mL  30 mL Oral Q4H PRN Cristofano, Paul A, MD      . benztropine (COGENTIN) tablet 0.5 mg  0.5 mg Oral BID ,  T, MD      . clonazePAM (KLONOPIN) tablet 1 mg  1 mg Oral  BID ,  T, MD      . escitalopram (LEXAPRO) tablet 10 mg  10 mg Oral Daily ,  T, MD   10 mg at 12/18/18 1413  . gabapentin (NEURONTIN) capsule 300 mg  300 mg Oral TID ,  T, MD      . lisinopril (ZESTRIL) tablet 20 mg  20 mg Oral Daily ,  T, MD   20 mg at 12/18/18 1413  . LORazepam (ATIVAN) tablet 1 mg  1 mg Oral Q4H PRN Cristofano, Paul A, MD   1 mg at 12/17/18 2110  . magnesium hydroxide (MILK OF MAGNESIA) suspension 30 mL  30 mL Oral Daily PRN Cristofano, Paul A, MD      . metFORMIN (GLUCOPHAGE) tablet 500 mg  500 mg Oral BID WC ,  T, MD      . nicotine (NICODERM CQ - dosed in mg/24 hours) patch 14 mg  14 mg Transdermal Daily ,  T, MD   14 mg at 12/18/18 0825  . [START ON 01/14/2019] paliperidone (INVEGA SUSTENNA) injection 234 mg  234 mg Intramuscular Q28 days Money, Travis B, FNP      . traZODone (DESYREL) tablet 100 mg  100 mg Oral QHS PRN Thompson, Jacqueline, NP   100 mg at 12/17/18 2110    Lab Results: No results found for this or any previous visit (from the past 48 hour(s)).  Blood Alcohol level:  Lab Results  Component Value Date   ETH <10 12/14/2018    Metabolic Disorder Labs: No results found for: HGBA1C, MPG No results found for: PROLACTIN Lab Results  Component Value Date   CHOL 110 12/16/2018   TRIG 77 12/16/2018   HDL 40 (L) 12/16/2018   CHOLHDL 2.8 12/16/2018   VLDL 15 12/16/2018   LDLCALC 55 12/16/2018    Physical Findings: AIMS:  , ,  ,  ,    CIWA:    COWS:     Musculoskeletal: Strength & Muscle Tone: within normal limits Gait & Station: normal Patient leans: N/A  Psychiatric Specialty Exam: Physical Exam  Nursing note and vitals reviewed. Constitutional: He appears well-developed and well-nourished.  HENT:  Head: Normocephalic and atraumatic.  Eyes: Pupils are equal, round, and reactive to light. Conjunctivae are normal.  Neck: Normal range of motion.  Cardiovascular: Regular rhythm  and normal heart sounds.  Respiratory: Effort normal. No respiratory distress.  GI: Soft.  Musculoskeletal: Normal range of motion.  Neurological: He is   alert.  Skin: Skin is warm and dry.  Psychiatric: His mood appears anxious. His affect is blunt. His speech is delayed. He is slowed and withdrawn. Thought content is paranoid. Cognition and memory are impaired. He expresses impulsivity. He expresses no homicidal and no suicidal ideation.    Review of Systems  Constitutional: Negative.   HENT: Negative.   Eyes: Negative.   Respiratory: Negative.   Cardiovascular: Negative.   Gastrointestinal: Negative.   Musculoskeletal: Negative.   Skin: Negative.   Neurological: Negative.   Psychiatric/Behavioral: Positive for depression and suicidal ideas. Negative for hallucinations and substance abuse. The patient is nervous/anxious and has insomnia.     Blood pressure (!) 153/91, pulse (!) 108, temperature 98.8 F (37.1 C), temperature source Oral, resp. rate 18, height 5' 10" (1.778 m), weight 90.3 kg, SpO2 97 %.Body mass index is 28.55 kg/m.  General Appearance: Disheveled  Eye Contact:  Fair  Speech:  Garbled  Volume:  Decreased  Mood:  Anxious  Affect:  Constricted and Depressed  Thought Process:  Disorganized  Orientation:  Full (Time, Place, and Person)  Thought Content:  Illogical, Paranoid Ideation, Rumination and Tangential  Suicidal Thoughts:  Yes.  without intent/plan  Homicidal Thoughts:  No  Memory:  Immediate;   Fair Recent;   Fair Remote;   Fair  Judgement:  Impaired  Insight:  Shallow  Psychomotor Activity:  Restlessness  Concentration:  Concentration: Poor  Recall:  Fair  Fund of Knowledge:  Fair  Language:  Fair  Akathisia:  Yes, unfortunately it looks like he is having some akathisia which is going to need to be controlled for a while since he has had 2 long-acting Invega shots in the last couple days.  Handed:  Right  AIMS (if indicated):     Assets:  Desire  for Improvement Housing Resilience Social Support  ADL's:  Impaired  Cognition:  Impaired,  Mild  Sleep:  Number of Hours: 8     Treatment Plan Summary: Daily contact with patient to assess and evaluate symptoms and progress in treatment, Medication management and Plan Patient with schizophrenia or schizoaffective disorder continues to feel distressed.  Having intrusive thoughts.  Able however to cooperate with treatment planning.  I reviewed his medications and made several changes.  I have put him back on some standing doses of clonazepam.  I have also added some standing Cogentin to partially assist with the akathisia.  I reviewed his antidepressant medicine and proposed to him that perhaps a serotonin reuptake inhibitor would be better than Wellbutrin given how anxious and obsessive he seems.  He says he is taken Zoloft in the past and had no problem with that so I am going to start him on Lexapro.  Also I put him back on his blood pressure medicine and the Metformin.  Did some psychoeducation and encouragement for him.  Hopefully we will get him on the right track and feeling better within a few days.   , MD 12/18/2018, 3:43 PM 

## 2018-12-18 NOTE — Progress Notes (Signed)
D - Patient was in his room upon arrival to the unit. Patient was pleasant during assessment denying SI/HI/AVH and pain. Patient endorses anxiety and depression. Patient was paranoid and isolative to his room this evening.   A - Patient compliant with medication administration per MD orders. Patient given education. Patient given support and encouragement to be active in his treatment plan. Patient informed to let staff know if there are any issues or problems on the unit.   R - Patient being monitored Q 15 minutes for safety per unit protocol. Patient remains safe on the unit

## 2018-12-18 NOTE — Plan of Care (Signed)
Patient is present in the milieu with a blunted affect.Patient is very anxious and stresses because of his thoughts of "stripping his cloths off."Patient did not act on it.Verbalized passive suicidal ideations.Contracts for safety.Denies HI and AVH at this time.Attended groups partially.Appetite and energy level good.Support and encouragement given.

## 2018-12-18 NOTE — Plan of Care (Signed)
Patient stated he was fighting the urge to take his clothes off and expose himself to females on the unit. Patient stated he didn't know why he felt that way since he way gay.   Problem: Coping: Goal: Ability to demonstrate self-control will improve Outcome: Not Progressing

## 2018-12-18 NOTE — [facility] Group Notes (Signed)
LCSW Group Therapy Note  12/18/2018 11:07 AM  Type of Therapy and Topic:  Group Therapy:  Feelings around Relapse and Recovery  Participation Level:  Minimal   Description of Group:    Patients in this group will discuss emotions they experience before and after a relapse. They will process how experiencing these feelings, or avoidance of experiencing them, relates to having a relapse. Facilitator will guide patients to explore emotions they have related to recovery. Patients will be encouraged to process which emotions are more powerful. They will be guided to discuss the emotional reaction significant others in their lives may have to their relapse or recovery. Patients will be assisted in exploring ways to respond to the emotions of others without this contributing to a relapse.  Therapeutic Goals: 1. Patient will identify two or more emotions that lead to a relapse for them 2. Patient will identify two emotions that result when they relapse 3. Patient will identify two emotions related to recovery 4. Patient will demonstrate ability to communicate their needs through discussion and/or role plays   Summary of Patient Progress: Pt was present in group for the first 10 minutes and identified a relapse as "A Backslide". Pt reported a relapse is bound to happen and is a normal part of recovery. Pt then got up and told CSW that he needed to leave group now and proceeded to leave group.    Therapeutic Modalities:   Cognitive Behavioral Therapy Solution-Focused Therapy Assertiveness Training Relapse Prevention Therapy   Evalina Field, MSW, LCSW Clinical Social Work 12/18/2018 11:07 AM

## 2018-12-18 NOTE — Progress Notes (Addendum)
Recreation Therapy Notes  Date: 12/18/2018  Time: 9:30 am  Location: Craft room  Behavioral response: Appropriate   Intervention Topic: Self-care    Discussion/Intervention:  Group content today was focused on Self-Care. The group defined self-care and some positive ways they care for themselves. Individuals expressed ways and reasons why they neglected any self-care in the past. Patients described ways to improve self-care in the future. The group explained what could happen if they did not do any self-care activities at all. The group participated in the intervention "self-care assessment" where they had a chance to discover some of their weaknesses and strengths in self- care. Patient came up with a self-care plan to improve themselves in the future.  Clinical Observations/Feedback:  Patient came to group and was focused on what peers and staff had to say about self-care. He stood up and his pants fell down; he apologized and was told by group facilitator he could return to his room. Individual left group.  Ritesh Opara LRT/CTRS          [place] 12/18/2018 10:56 AM

## 2018-12-19 MED ORDER — CLONAZEPAM 0.5 MG PO TABS
0.5000 mg | ORAL_TABLET | Freq: Two times a day (BID) | ORAL | Status: DC
  Administered 2018-12-19 – 2019-01-03 (×30): 0.5 mg via ORAL
  Filled 2018-12-19 (×30): qty 1

## 2018-12-19 NOTE — Plan of Care (Signed)
  Problem: Education: Goal: Knowledge of [facility] Education information/materials will improve Outcome: Progressing  D: Patient has been calm and cooperative. Denies SI, HI and AVH. A: Continue to monitor for safety R: Safety maintained.

## 2018-12-19 NOTE — [facility] Group Notes (Addendum)
LCSW Group Therapy 12/19/2018 1:00pm  Type of Therapy and Topic:  Group Therapy:  Setting Goals  Participation Level:  Active  Description of Group: In this process group, patients discussed using strengths to work toward goals and address challenges.  Patients identified two positive things about themselves and one goal they were working on.  Patients were given the opportunity to share openly and support each other's plan for self-empowerment.  The group discussed the value of gratitude and were encouraged to have a daily reflection of positive characteristics or circumstances.  Patients were encouraged to identify a plan to utilize their strengths to work on current challenges and goals.  Therapeutic Goals 1. Patient will verbalize personal strengths/positive qualities and relate how these can assist with achieving desired personal goals 2. Patients will verbalize affirmation of peers plans for personal change and goal setting 3. Patients will explore the value of gratitude and positive focus as related to successful achievement of goals 4. Patients will verbalize a plan for regular reinforcement of personal positive qualities and circumstances.  Summary of Patient Progress: Patient identified the definition of goals.Patients was given the opportunity to share openly and support other group members' plan for self-empowerment. Patient verbalized personal strength and how they relate to achieving the desired goal. Patient was able to identify positive goals to work towards when she returns home. Patient participated in group discussion. During check-ins, patient identified feeling"confused because I have this compulsion that I want to strip naked in front of women." He acknowledged feeling that he wanted to strip during group but he was working hard to control the urge. CSW acknowledged his attempts and thanked him. Patient identified that drinking a cold Pepsi makes him happy. He identified his  goal is to stop feeling compulsion to strip naked in front of women. Patients were given a SMART goal worksheet for them to write down their goals and verify that their goal is SMART.    Therapeutic Modalities Cognitive Behavioral Therapy Motivational Interviewing    Netta Neat, [place]

## 2018-12-19 NOTE — Progress Notes (Signed)
D: Patient has been calm and cooperative. Denies SI, HI and AVH A: Continue to monitor for safety R: Safety maintained. 

## 2018-12-19 NOTE — Progress Notes (Signed)
[facility] MD Progress Note  12/19/2018 2:32 PM Peter Parsons  MRN:  [id number] Subjective: Follow-up for this man with schizophrenia and what to me seems like very obsessive-compulsive symptoms as well.  Patient seen chart reviewed.  He has been attending some groups today and interacting with others but when I went to see him early in the afternoon he was in his room feeling very tired saying that he felt like the medicine was making him sleepy.  He did not sleep well last night however.  He says he still has these intrusive thoughts that he is going to take his clothing off.  Denies any suicidal thoughts but he feels very distressed and unhappy about these worries that he is going to take his clothes off.  He has not been aggressive or violent.  He has no new physical complaints. Principal Problem: Schizophrenia ([place]) Diagnosis: Principal Problem:   Schizophrenia ([place])  Total Time spent with patient: 30 minutes  Past Psychiatric History: Patient has a history of chronic mental illness.  Longstanding diagnosis of schizophrenia or schizoaffective disorder  Past Medical History:  Past Medical History:  Diagnosis Date  . Diabetes mellitus without complication ([place])   . Hypertension   . Schizophrenia ([employer])    History reviewed. No pertinent surgical history. Family History: History reviewed. No pertinent family history. Family Psychiatric  History: See previous Social History:  Social History   Substance and Sexual Activity  Alcohol Use None     Social History   Substance and Sexual Activity  Drug Use Not on file    Social History   Socioeconomic History  . Marital status: Single    Spouse name: Not on file  . Number of children: Not on file  . Years of education: Not on file  . Highest education level: Not on file  Occupational History  . Not on file  Social Needs  . Financial resource strain: Not on file  . Food insecurity    Worry: Not on file    Inability: Not on file  .  Transportation needs    Medical: Not on file    Non-medical: Not on file  Tobacco Use  . Smoking status: Never Smoker  . Smokeless tobacco: Never Used  Substance and Sexual Activity  . Alcohol use: Not on file  . Drug use: Not on file  . Sexual activity: Not on file  Lifestyle  . Physical activity    Days per week: Not on file    Minutes per session: Not on file  . Stress: Not on file  Relationships  . Social Herbalist on phone: Not on file    Gets together: Not on file    Attends religious service: Not on file    Active member of club or organization: Not on file    Attends meetings of clubs or organizations: Not on file    Relationship status: Not on file  Other Topics Concern  . Not on file  Social History Narrative  . Not on file   Additional Social History:                         Sleep: Poor  Appetite:  Fair  Current Medications: Current Facility-Administered Medications  Medication Dose Route Frequency Provider Last Rate Last Dose  . acetaminophen (TYLENOL) tablet 650 mg  650 mg Oral Q6H PRN Cristofano, Dorene Ar, MD      . alum & mag hydroxide-simeth (  MAALOX/MYLANTA) 200-200-20 MG/5ML suspension 30 mL  30 mL Oral Q4H PRN Cristofano, Paul A, MD      . benztropine (COGENTIN) tablet 0.5 mg  0.5 mg Oral BID Paiden Caraveo T, MD   0.5 mg at 12/19/18 0830  . clonazePAM (KLONOPIN) tablet 0.5 mg  0.5 mg Oral BID Marsia Cino T, MD      . escitalopram (LEXAPRO) tablet 10 mg  10 mg Oral Daily Annalisa Colonna, Madie Reno, MD   10 mg at 12/19/18 0830  . gabapentin (NEURONTIN) capsule 300 mg  300 mg Oral TID Genora Arp, Madie Reno, MD   300 mg at 12/19/18 1204  . lisinopril (ZESTRIL) tablet 20 mg  20 mg Oral Daily Aneliz Carbary T, MD   20 mg at 12/19/18 0830  . LORazepam (ATIVAN) tablet 1 mg  1 mg Oral Q4H PRN Cristofano, Dorene Ar, MD   1 mg at 12/17/18 2110  . magnesium hydroxide (MILK OF MAGNESIA) suspension 30 mL  30 mL Oral Daily PRN Cristofano, Paul A, MD      . metFORMIN  (GLUCOPHAGE) tablet 500 mg  500 mg Oral BID WC Shan Padgett T, MD   500 mg at 12/19/18 0830  . nicotine (NICODERM CQ - dosed in mg/24 hours) patch 14 mg  14 mg Transdermal Daily Carmesha Morocco, Madie Reno, MD   14 mg at 12/19/18 [id number]  . [START ON 01/14/2019] paliperidone (INVEGA SUSTENNA) injection 234 mg  234 mg Intramuscular Q28 days Money, Lowry Ram, FNP      . traZODone (DESYREL) tablet 100 mg  100 mg Oral QHS PRN Caroline Sauger, NP   100 mg at 12/18/18 2114    Lab Results: No results found for this or any previous visit (from the past 48 hour(s)).  Blood Alcohol level:  Lab Results  Component Value Date   ETH <10 99991111    Metabolic Disorder Labs: No results found for: HGBA1C, MPG No results found for: PROLACTIN Lab Results  Component Value Date   CHOL 110 12/16/2018   TRIG 77 12/16/2018   HDL 40 (L) 12/16/2018   CHOLHDL 2.8 12/16/2018   VLDL 15 12/16/2018   LDLCALC 55 12/16/2018    Physical Findings: AIMS:  , ,  ,  ,    CIWA:    COWS:     Musculoskeletal: Strength & Muscle Tone: within normal limits Gait & Station: unsteady Patient leans: N/A  Psychiatric Specialty Exam: Physical Exam  Nursing note and vitals reviewed. Constitutional: He appears well-developed and well-nourished.  HENT:  Head: Normocephalic and atraumatic.  Eyes: Pupils are equal, round, and reactive to light. Conjunctivae are normal.  Neck: Normal range of motion.  Cardiovascular: Regular rhythm and normal heart sounds.  Respiratory: Effort normal. No respiratory distress.  GI: Soft.  Musculoskeletal: Normal range of motion.  Neurological: He is alert.  Skin: Skin is warm and dry.  Psychiatric: His mood appears anxious. His affect is blunt. His speech is delayed. He is slowed and withdrawn. Cognition and memory are impaired. He expresses impulsivity and inappropriate judgment. He exhibits a depressed mood. He expresses no homicidal and no suicidal ideation.    Review of Systems   Constitutional: Negative.   HENT: Negative.   Eyes: Negative.   Respiratory: Negative.   Cardiovascular: Negative.   Gastrointestinal: Negative.   Musculoskeletal: Negative.   Skin: Negative.   Neurological: Negative.   Psychiatric/Behavioral: Positive for depression. Negative for hallucinations, substance abuse and suicidal ideas. The patient is nervous/anxious and has insomnia.  Blood pressure (!) 149/80, pulse (!) 108, temperature 98.1 F (36.7 C), temperature source Oral, resp. rate 17, height 5\' 10"  (1.778 m), weight 90.3 kg, SpO2 99 %.Body mass index is 28.55 kg/m.  General Appearance: Disheveled  Eye Contact:  Fair  Speech:  Slow  Volume:  Decreased  Mood:  Depressed  Affect:  Congruent  Thought Process:  Coherent  Orientation:  Full (Time, Place, and Person)  Thought Content:  Obsessions, Rumination and Tangential  Suicidal Thoughts:  No  Homicidal Thoughts:  No  Memory:  Immediate;   Fair Recent;   Fair Remote;   Fair  Judgement:  Impaired  Insight:  Shallow  Psychomotor Activity:  Decreased  Concentration:  Concentration: Fair  Recall:  [employer] of Knowledge:  Fair  Language:  Fair  Akathisia:  No  Handed:  Right  AIMS (if indicated):     Assets:  Desire for Improvement Housing Physical Health  ADL's:  Impaired  Cognition:  Impaired,  Mild  Sleep:  Number of Hours: 8.25     Treatment Plan Summary: Daily contact with patient to assess and evaluate symptoms and progress in treatment, Medication management and Plan He tells me he did not sleep well but he is charted as having slept over 8 hours last night.  Still tired today.  Perhaps overmedicated I will cut down on the dose of the clonazepam.  I also had added Cogentin because of what seemed like akathisia.  I will leave that for today and see how he is doing tomorrow.  Supportive counseling and reassurance to him.  No other change to medicine for today.  I do not think we need to add anything since he  already seems so tired.  He should be encouraged to wash himself up and take care of his hygiene better.  Alethia Berthold, MD 12/19/2018, 2:32 PM

## 2018-12-19 NOTE — Plan of Care (Signed)
Patient verbalize understanding of Educational  concerns. Encourage patient participation with unit programing , encourage verbalization of feeling  in therapy groups. Patient knowledgeable of information received  concerning medication.   Voice no concerns around sleep and wake cycle .  Patient working on  Estate agent , and anxiety. Patient  denies suicidal ideations Emotional and mental status improved . Voice of no safety concerns .  No concerns around safety.  Problem: Safety: Goal: Periods of time without injury will increase Outcome: Progressing   Problem: Coping: Goal: Ability to verbalize frustrations and anger appropriately will improve Outcome: Progressing Goal: Ability to demonstrate self-control will improve Outcome: Progressing   Problem: Education: Goal: Knowledge of [place] [facility] Education information/materials will improve Outcome: Progressing

## 2018-12-20 LAB — GLUCOSE, CAPILLARY: Glucose-Capillary: 86 mg/dL (ref 70–99)

## 2018-12-20 MED ORDER — ESCITALOPRAM OXALATE 10 MG PO TABS
15.0000 mg | ORAL_TABLET | Freq: Every day | ORAL | Status: DC
  Administered 2018-12-21 – 2018-12-22 (×2): 15 mg via ORAL
  Filled 2018-12-20 (×2): qty 2

## 2018-12-20 MED ORDER — LOPERAMIDE HCL 2 MG PO CAPS
2.0000 mg | ORAL_CAPSULE | ORAL | Status: DC | PRN
  Administered 2018-12-20: 2 mg via ORAL
  Filled 2018-12-20: qty 1

## 2018-12-20 NOTE — [facility] Group Notes (Signed)
LCSW Group Therapy Note 12/20/2018 1:15pm  Type of Therapy and Topic: Group Therapy: Feelings Around Returning Home & Establishing a Supportive Framework and Supporting Oneself When Supports Not Available  Participation Level: Active  Description of Group:  Patients first processed thoughts and feelings about upcoming discharge. These included fears of upcoming changes, lack of change, new living environments, judgements and expectations from others and overall stigma of mental health issues. The group then discussed the definition of a supportive framework, what that looks and feels like, and how do to discern it from an unhealthy non-supportive network. The group identified different types of supports as well as what to do when your family/friends are less than helpful or unavailable  Therapeutic Goals  1. Patient will identify one healthy supportive network that they can use at discharge. 2. Patient will identify one factor of a supportive framework and how to tell it from an unhealthy network. 3. Patient able to identify one coping skill to use when they do not have positive supports from others. 4. Patient will demonstrate ability to communicate their needs through discussion and/or role plays.  Summary of Patient Progress:  The patient reported he feels bad. Pt engaged during group session. As patients processed their anxiety about discharge and described healthy supports patient shared he is not ready to be discharge.  Patients identified at least one self-care tool they were willing to use after discharge.   Therapeutic Modalities Cognitive Behavioral Therapy Motivational Interviewing   Peter Lindamood  CUEBAS-COLON, LCSW 12/20/2018 11:13 AM

## 2018-12-20 NOTE — Plan of Care (Signed)
  Problem: Education: Goal: Knowledge of [place] [facility] Education information/materials will improve Outcome: Not Progressing  D: Patient has been isolative to room and to self. Denies SI, HI and AVH. Affect is flat and slightly bizarre. Mood is depressed. Appears internally preoccupied. A: Continue to monitor for safety R: Safety maintained.

## 2018-12-20 NOTE — Progress Notes (Signed)
D: Patient has been isolative to room and to self. Denies SI, HI and AVH. Affect is flat and slightly bizarre. Mood is depressed. Appears internally preoccupied. A: Continue to monitor for safety R: Safety maintained.

## 2018-12-20 NOTE — Progress Notes (Signed)
Patient's CBG was not done at noon because patient had already eaten lunch. CBG monitoring will be done before dinner.

## 2018-12-20 NOTE — Progress Notes (Signed)
Patient's morning BP medication was not given because BP was 97/67.

## 2018-12-20 NOTE — Plan of Care (Signed)
D- Patient alert and oriented. Patient presented in a pleasant mood on assessment stating that he slept good last night and had no complaints to voice to this Probation officer. Patient endorsed AH, reporting that the voices are still telling him to strip in front of women. Patient also stated that because of this, he is depressed and anxious, rating them a "7/10" and "8/10" on his self-inventory. Patient denied SI, HI, VH, and pain at this time. Patient's goal for today is "getting ready to go home", in which he will "pray" in order to accomplish his goal.  A- Scheduled medications administered to patient, per MD orders. Support and encouragement provided.  Routine safety checks conducted every 15 minutes.  Patient informed to notify staff with problems or concerns.  R- No adverse drug reactions noted. Patient contracts for safety at this time. Patient compliant with medications and treatment plan. Patient receptive, calm, and cooperative. Patient interacts well with others on the unit.  Patient remains safe at this time.  Problem: Education: Goal: Knowledge of [place] [facility] Education information/materials will improve Outcome: Progressing   Problem: Coping: Goal: Ability to verbalize frustrations and anger appropriately will improve Outcome: Progressing Goal: Ability to demonstrate self-control will improve Outcome: Progressing   Problem: Safety: Goal: Periods of time without injury will increase Outcome: Progressing

## 2018-12-20 NOTE — Progress Notes (Signed)
[facility] MD Progress Note  12/20/2018 11:27 AM Peter Parsons  MRN:  [id number] Subjective: Patient seen chart reviewed.  Patient with schizophrenia and what to me seems very obsessive-compulsive thoughts.  Patient was awake today but laying in bed.  Looks pretty disheveled.  He told me however that he had slept a little better last night.  He said his mood was still not feeling good and he was still having thoughts that he was going to do something to take his clothes off in front of people but as usual he has not done anything about it.  Intermittent hallucinations but does not seem overwhelmed by them.  Still seems a little sedated from his medicine but not too much.  Blood pressure was a little low today. Principal Problem: Schizophrenia ([place]) Diagnosis: Principal Problem:   Schizophrenia ([place])  Total Time spent with patient: 30 minutes  Past Psychiatric History: Patient has a past history of schizophrenia and only recently has moved into a group home which is greatly complicated his lifestyle and caused him to decompensate  Past Medical History:  Past Medical History:  Diagnosis Date  . Diabetes mellitus without complication ([place])   . Hypertension   . Schizophrenia ([place])    History reviewed. No pertinent surgical history. Family History: History reviewed. No pertinent family history. Family Psychiatric  History: See previous Social History:  Social History   Substance and Sexual Activity  Alcohol Use None     Social History   Substance and Sexual Activity  Drug Use Not on file    Social History   Socioeconomic History  . Marital status: Single    Spouse name: Not on file  . Number of children: Not on file  . Years of education: Not on file  . Highest education level: Not on file  Occupational History  . Not on file  Social Needs  . Financial resource strain: Not on file  . Food insecurity    Worry: Not on file    Inability: Not on file  . Transportation needs    Medical:  Not on file    Non-medical: Not on file  Tobacco Use  . Smoking status: Never Smoker  . Smokeless tobacco: Never Used  Substance and Sexual Activity  . Alcohol use: Not on file  . Drug use: Not on file  . Sexual activity: Not on file  Lifestyle  . Physical activity    Days per week: Not on file    Minutes per session: Not on file  . Stress: Not on file  Relationships  . Social Herbalist on phone: Not on file    Gets together: Not on file    Attends religious service: Not on file    Active member of club or organization: Not on file    Attends meetings of clubs or organizations: Not on file    Relationship status: Not on file  Other Topics Concern  . Not on file  Social History Narrative  . Not on file   Additional Social History:                         Sleep: Fair  Appetite:  Fair  Current Medications: Current Facility-Administered Medications  Medication Dose Route Frequency Provider Last Rate Last Dose  . acetaminophen (TYLENOL) tablet 650 mg  650 mg Oral Q6H PRN Cristofano, Dorene Ar, MD      . alum & mag hydroxide-simeth (MAALOX/MYLANTA) 200-200-20 MG/5ML  suspension 30 mL  30 mL Oral Q4H PRN Cristofano, Paul A, MD      . benztropine (COGENTIN) tablet 0.5 mg  0.5 mg Oral BID Laramie Meissner, Madie Reno, MD   0.5 mg at 12/20/18 [id number]  . clonazePAM (KLONOPIN) tablet 0.5 mg  0.5 mg Oral BID Antoria Lanza, Madie Reno, MD   0.5 mg at 12/20/18 [id number]  . [START ON 12/21/2018] escitalopram (LEXAPRO) tablet 15 mg  15 mg Oral Daily Viridiana Spaid T, MD      . gabapentin (NEURONTIN) capsule 300 mg  300 mg Oral TID Lilyian Quayle, Madie Reno, MD   300 mg at 12/20/18 [id number]  . lisinopril (ZESTRIL) tablet 20 mg  20 mg Oral Daily Lonza Shimabukuro T, MD   20 mg at 12/19/18 0830  . LORazepam (ATIVAN) tablet 1 mg  1 mg Oral Q4H PRN Cristofano, Dorene Ar, MD   1 mg at 12/17/18 2110  . magnesium hydroxide (MILK OF MAGNESIA) suspension 30 mL  30 mL Oral Daily PRN Cristofano, Paul A, MD      . metFORMIN (GLUCOPHAGE)  tablet 500 mg  500 mg Oral BID WC Flavio Lindroth, Madie Reno, MD   500 mg at 12/20/18 [id number]  . nicotine (NICODERM CQ - dosed in mg/24 hours) patch 14 mg  14 mg Transdermal Daily Shyia Fillingim, Madie Reno, MD   14 mg at 12/20/18 0839  . [START ON 01/14/2019] paliperidone (INVEGA SUSTENNA) injection 234 mg  234 mg Intramuscular Q28 days Money, Lowry Ram, FNP      . traZODone (DESYREL) tablet 100 mg  100 mg Oral QHS PRN Caroline Sauger, NP   100 mg at 12/18/18 2114    Lab Results: No results found for this or any previous visit (from the past 48 hour(s)).  Blood Alcohol level:  Lab Results  Component Value Date   ETH <10 99991111    Metabolic Disorder Labs: No results found for: HGBA1C, MPG No results found for: PROLACTIN Lab Results  Component Value Date   CHOL 110 12/16/2018   TRIG 77 12/16/2018   HDL 40 (L) 12/16/2018   CHOLHDL 2.8 12/16/2018   VLDL 15 12/16/2018   LDLCALC 55 12/16/2018    Physical Findings: AIMS:  , ,  ,  ,    CIWA:    COWS:     Musculoskeletal: Strength & Muscle Tone: within normal limits Gait & Station: normal Patient leans: N/A  Psychiatric Specialty Exam: Physical Exam  Nursing note and vitals reviewed. Constitutional: He appears well-developed and well-nourished.  HENT:  Head: Normocephalic and atraumatic.  Eyes: Pupils are equal, round, and reactive to light. Conjunctivae are normal.  Neck: Normal range of motion.  Cardiovascular: Regular rhythm and normal heart sounds.  Respiratory: Effort normal. No respiratory distress.  GI: Soft.  Musculoskeletal: Normal range of motion.  Neurological: He is alert.  Skin: Skin is warm and dry.  Psychiatric: His affect is blunt. His speech is delayed. He is slowed. Thought content is paranoid. Cognition and memory are impaired. He expresses impulsivity. He expresses suicidal ideation. He expresses no suicidal plans.    Review of Systems  Constitutional: Negative.   HENT: Negative.   Eyes: Negative.   Respiratory:  Negative.   Cardiovascular: Negative.   Gastrointestinal: Negative.   Musculoskeletal: Negative.   Skin: Negative.   Neurological: Negative.   Psychiatric/Behavioral: Positive for depression and suicidal ideas. Negative for hallucinations, memory loss and substance abuse. The patient is nervous/anxious. The patient does not have insomnia.     Blood pressure  97/67, pulse (!) 118, temperature 97.6 F (36.4 C), temperature source Oral, resp. rate 18, height 5\' 10"  (1.778 m), weight 90.3 kg, SpO2 98 %.Body mass index is 28.55 kg/m.  General Appearance: Casual  Eye Contact:  Good  Speech:  Clear and Coherent  Volume:  Decreased  Mood:  Depressed and Dysphoric  Affect:  Congruent  Thought Process:  Coherent  Orientation:  Full (Time, Place, and Person)  Thought Content:  Paranoid Ideation  Suicidal Thoughts:  Yes.  without intent/plan  Homicidal Thoughts:  No  Memory:  Immediate;   Fair Recent;   Fair Remote;   Fair  Judgement:  Fair  Insight:  Fair  Psychomotor Activity:  Decreased  Concentration:  Concentration: Fair  Recall:  [employer] of Knowledge:  Fair  Language:  Fair  Akathisia:  No  Handed:  Right  AIMS (if indicated):     Assets:  Desire for Improvement Housing Physical Health Resilience  ADL's:  Impaired  Cognition:  Impaired,  Mild  Sleep:  Number of Hours: 7.75     Treatment Plan Summary: Daily contact with patient to assess and evaluate symptoms and progress in treatment, Medication management and Plan He is staying sluggish and may not be eating well so I am going to check his blood sugar more regularly.  Encourage him to get up out of bed to eat and drink and to go to groups.  I am increasing the Lexapro to 15 mg today leaving the antipsychotics where they are trying to encourage him to be more positive and active.  Did some counseling about how to deal with intrusive thoughts.  Alethia Berthold, MD 12/20/2018, 11:27 AM

## 2018-12-21 LAB — GLUCOSE, CAPILLARY: Glucose-Capillary: 92 mg/dL (ref 70–99)

## 2018-12-21 NOTE — [facility] Group Notes (Signed)
LCSW Group Therapy Note   12/21/2018 1:00 PM  Type of Therapy and Topic:  Group Therapy:  Overcoming Obstacles   Participation Level:  None   Description of Group:    In this group patients will be encouraged to explore what they see as obstacles to their own wellness and recovery. They will be guided to discuss their thoughts, feelings, and behaviors related to these obstacles. The group will process together ways to cope with barriers, with attention given to specific choices patients can make. Each patient will be challenged to identify changes they are motivated to make in order to overcome their obstacles. This group will be process-oriented, with patients participating in exploration of their own experiences as well as giving and receiving support and challenge from other group members.   Therapeutic Goals: 1. Patient will identify personal and current obstacles as they relate to admission. 2. Patient will identify barriers that currently interfere with their wellness or overcoming obstacles.  3. Patient will identify feelings, thought process and behaviors related to these barriers. 4. Patient will identify two changes they are willing to make to overcome these obstacles:      Summary of Patient Progress Patient attended group, however did not engage in discussion.  Patient sat with his eyes closed throughout group.  Therapeutic Modalities:   Cognitive Behavioral Therapy Solution Focused Therapy Motivational Interviewing Relapse Prevention Therapy  Assunta Curtis, MSW, LCSW 12/21/2018 12:38 PM

## 2018-12-21 NOTE — Plan of Care (Signed)
Patient compliant with medication administration per MD orders and procedures on the unit.   Problem: Coping: Goal: Ability to demonstrate self-control will improve Outcome: Progressing

## 2018-12-21 NOTE — Plan of Care (Signed)
Patient continues to say that the voices telling him  to stripp off his cloths.Patient states "I am scared."With encouragement patient did some personal hygiene.Patient appears sad and slow.Patient asked for "sedatives" multiple times.Compliant with medications.Attended some groups partially.Appetite fair.Support and encouragement given.

## 2018-12-21 NOTE — Progress Notes (Signed)
Recreation Therapy Notes   Date: 12/21/2018  Time: 9:30 am   Location: Craft room   Behavioral response: N/A   Intervention Topic: Values  Discussion/Intervention: Patient did not attend group.   Clinical Observations/Feedback:  Patient did not attend group.   Brixon Zhen LRT/CTRS        Lisle Skillman 12/21/2018 10:55 AM

## 2018-12-21 NOTE — Progress Notes (Signed)
D - Patient was in his room upon arrival to the unit. Patient was pleasant during assessment denying SI/HI/AVH and pain. Patient endorses anxiety and depression. Patient was paranoid and isolative to his room this evening.   A - Patient compliant with medication administration per MD orders. Patient given education. Patient given support and encouragement to be active in his treatment plan. Patient informed to let staff know if there are any issues or problems on the unit.   R - Patient being monitored Q 15 minutes for safety per unit protocol. Patient remains safe on the unit

## 2018-12-21 NOTE — Progress Notes (Signed)
[facility] MD Progress Note  12/21/2018 2:14 PM Peter Parsons  MRN:  [id number]   Subjective:  This is a follow up with a patient with Schizophrenia. Patient reports today that he is feeling some better but that he still has the intrusive thoughts of wanting to undress in front of women. He denies any suicidal or homicidal ideations and denies any hallucinations. He reports that he doesn't feel ready to go back to the group home but feels that in about 3 days he will be ready. He reports that he has been going to groups and realizes that he can't take his clothes off in front of people. He reports sleeping good and having a fair appetite. He denies any medication side effects and reports that he was able to take a shower last night.  Principal Problem: Schizophrenia ([place]) Diagnosis: Principal Problem:   Schizophrenia ([place])  Total Time spent with patient: 20 minutes  Past Psychiatric History: Patient has a long history of schizophrenia.  Multiple prior hospitalizations.  Multiple medications.  He tells me that the long-acting Invega injection he had been receiving in the past was most effective.  For many years he had lived with his parents but more recently had been in jail and now on release is being placed in a group home for the first time in his life.  He admits to having had past suicide attempts but the last time he said was several years ago.  He denies ever having been violent.  Past Medical History:  Past Medical History:  Diagnosis Date  . Diabetes mellitus without complication ([place])   . Hypertension   . Schizophrenia ([place])    History reviewed. No pertinent surgical history. Family History: History reviewed. No pertinent family history. Family Psychiatric  History: None known Social History:  Social History   Substance and Sexual Activity  Alcohol Use None     Social History   Substance and Sexual Activity  Drug Use Not on file    Social History   Socioeconomic History  .  Marital status: Single    Spouse name: Not on file  . Number of children: Not on file  . Years of education: Not on file  . Highest education level: Not on file  Occupational History  . Not on file  Social Needs  . Financial resource strain: Not on file  . Food insecurity    Worry: Not on file    Inability: Not on file  . Transportation needs    Medical: Not on file    Non-medical: Not on file  Tobacco Use  . Smoking status: Never Smoker  . Smokeless tobacco: Never Used  Substance and Sexual Activity  . Alcohol use: Not on file  . Drug use: Not on file  . Sexual activity: Not on file  Lifestyle  . Physical activity    Days per week: Not on file    Minutes per session: Not on file  . Stress: Not on file  Relationships  . Social Herbalist on phone: Not on file    Gets together: Not on file    Attends religious service: Not on file    Active member of club or organization: Not on file    Attends meetings of clubs or organizations: Not on file    Relationship status: Not on file  Other Topics Concern  . Not on file  Social History Narrative  . Not on file   Additional Social History:  Sleep: Good  Appetite:  Fair  Current Medications: Current Facility-Administered Medications  Medication Dose Route Frequency Provider Last Rate Last Dose  . acetaminophen (TYLENOL) tablet 650 mg  650 mg Oral Q6H PRN Cristofano, Dorene Ar, MD      . alum & mag hydroxide-simeth (MAALOX/MYLANTA) 200-200-20 MG/5ML suspension 30 mL  30 mL Oral Q4H PRN Cristofano, Dorene Ar, MD   30 mL at 12/20/18 1222  . benztropine (COGENTIN) tablet 0.5 mg  0.5 mg Oral BID Clapacs, Madie Reno, MD   0.5 mg at 12/21/18 0807  . clonazePAM (KLONOPIN) tablet 0.5 mg  0.5 mg Oral BID Clapacs, Madie Reno, MD   0.5 mg at 12/21/18 0806  . escitalopram (LEXAPRO) tablet 15 mg  15 mg Oral Daily Clapacs, Madie Reno, MD   15 mg at 12/21/18 0806  . gabapentin (NEURONTIN) capsule 300 mg  300 mg  Oral TID Clapacs, Madie Reno, MD   300 mg at 12/21/18 1209  . lisinopril (ZESTRIL) tablet 20 mg  20 mg Oral Daily Clapacs, Madie Reno, MD   20 mg at 12/21/18 0807  . loperamide (IMODIUM) capsule 2 mg  2 mg Oral PRN Clapacs, Madie Reno, MD   2 mg at 12/20/18 2142  . LORazepam (ATIVAN) tablet 1 mg  1 mg Oral Q4H PRN Cristofano, Dorene Ar, MD   1 mg at 12/20/18 1401  . magnesium hydroxide (MILK OF MAGNESIA) suspension 30 mL  30 mL Oral Daily PRN Cristofano, Paul A, MD      . metFORMIN (GLUCOPHAGE) tablet 500 mg  500 mg Oral BID WC Clapacs, Madie Reno, MD   500 mg at 12/21/18 0806  . nicotine (NICODERM CQ - dosed in mg/24 hours) patch 14 mg  14 mg Transdermal Daily Clapacs, Madie Reno, MD   14 mg at 12/21/18 0806  . [START ON 01/14/2019] paliperidone (INVEGA SUSTENNA) injection 234 mg  234 mg Intramuscular Q28 days Albert Devaul, Lowry Ram, FNP      . traZODone (DESYREL) tablet 100 mg  100 mg Oral QHS PRN Caroline Sauger, NP   100 mg at 12/18/18 2114    Lab Results:  Results for orders placed or performed during the hospital encounter of 12/15/18 (from the past 48 hour(s))  Glucose, capillary     Status: None   Collection Time: 12/20/18  4:23 PM  Result Value Ref Range   Glucose-Capillary 86 70 - 99 mg/dL   Comment 1 Notify RN     Blood Alcohol level:  Lab Results  Component Value Date   ETH <10 99991111    Metabolic Disorder Labs: No results found for: HGBA1C, MPG No results found for: PROLACTIN Lab Results  Component Value Date   CHOL 110 12/16/2018   TRIG 77 12/16/2018   HDL 40 (L) 12/16/2018   CHOLHDL 2.8 12/16/2018   VLDL 15 12/16/2018   LDLCALC 55 12/16/2018    Physical Findings: AIMS:  , ,  ,  ,    CIWA:    COWS:     Musculoskeletal: Strength & Muscle Tone: within normal limits and decreased Gait & Station: shuffle Patient leans: N/A  Psychiatric Specialty Exam: Physical Exam  Nursing note and vitals reviewed. Constitutional: He is oriented to person, place, and time. He appears  well-developed and well-nourished.  Respiratory: Effort normal.  Musculoskeletal: Normal range of motion.  Neurological: He is alert and oriented to person, place, and time.  Skin: Skin is warm.    Review of Systems  Constitutional: Negative.   HENT:  Negative.   Eyes: Negative.   Respiratory: Negative.   Cardiovascular: Negative.   Gastrointestinal: Negative.   Genitourinary: Negative.   Musculoskeletal: Negative.   Skin: Negative.   Neurological: Negative.   Endo/Heme/Allergies: Negative.   Psychiatric/Behavioral: Positive for depression. The patient is nervous/anxious.        Intrusive thoughts    Blood pressure 111/70, pulse (!) 119, temperature 97.8 F (36.6 C), temperature source Oral, resp. rate 17, height 5\' 10"  (1.778 m), weight 90.3 kg, SpO2 98 %.Body mass index is 28.55 kg/m.  General Appearance: Disheveled  Eye Contact:  Good  Speech:  Clear and Coherent and Slow  Volume:  Decreased  Mood:  Anxious and Depressed  Affect:  Constricted  Thought Process:  Linear  Orientation:  Full (Time, Place, and Person)  Thought Content:  Paranoid Ideation  Suicidal Thoughts:  No  Homicidal Thoughts:  No  Memory:  Immediate;   Fair Recent;   Fair Remote;   Fair  Judgement:  Fair  Insight:  Fair  Psychomotor Activity:  Decreased  Concentration:  Concentration: Fair  Recall:  [employer] of Knowledge:  Fair  Language:  Fair  Akathisia:  No  Handed:  Right  AIMS (if indicated):     Assets:  Desire for Improvement Housing Physical Health Resilience  ADL's:  Impaired  Cognition:  Impaired,  Mild  Sleep:  Number of Hours: 7.75   Assessment: Patient presents in his room lying in the bed but is awake.  Patient is continued to have concerns about his intrusive thoughts about getting naked in front of women but then also states that he understands that he does not need to do it and that he has been attending groups and been able to stay dressed.  Patient has continued to deny  any suicidal homicidal ideations and denies any hallucinations.  Patient also reports that he feels that he is improving and that he feels that in approximately 3 days he would be ready for discharge.  Patient continues to have a constricted affect and moves slowly and has A shuffling gait when he is walking around the milieu.  Treatment Plan Summary: Daily contact with patient to assess and evaluate symptoms and progress in treatment and Medication management  Continue Cogentin 0.5 mg PO BID for EPS Continue Lexapro 15 mg PO Daily for depression and thoughts Continue Gabapentin 300 mg PO TID for mood stability Continue Ativan 1 mg PO Q4H PRN for anxiety Continue Invega Sustenna 234 mg IM with next dose due on 01-14-19 for schizophrenia Continue Trazodone 100 mg PO QHS PRN for insomnia Encourage group therapy participation Continue Q15 minute safety checks  Lewis Shock, FNP 12/21/2018, 2:14 PM

## 2018-12-21 NOTE — Progress Notes (Signed)
D: Complaining of diarrhea. Medicated per prn order. Denies SI, HI and AVH A: Continue to monitor for safety R: Safety maintained.

## 2018-12-21 NOTE — Plan of Care (Signed)
  Problem: Education: Goal: Knowledge of [facility] Education information/materials will improve Outcome: Progressing  D: Complaining of diarrhea. Medicated per prn order. Denies SI, HI and AVH A: Continue to monitor for safety R: Safety maintained.

## 2018-12-22 MED ORDER — ESCITALOPRAM OXALATE 10 MG PO TABS
20.0000 mg | ORAL_TABLET | Freq: Every day | ORAL | Status: DC
  Administered 2018-12-23 – 2018-12-26 (×4): 20 mg via ORAL
  Filled 2018-12-22 (×4): qty 2

## 2018-12-22 NOTE — Progress Notes (Signed)
Recreation Therapy Notes   Date: 12/22/2018  Time: 9:30 am  Location: Craft room  Behavioral response: Appropriate   Intervention Topic: Relaxation   Discussion/Intervention:   Group content today was focused on relaxation. The group defined relaxation and identified healthy ways to relax. Individuals expressed how much time they spend relaxing. Patients expressed how much their life would be if they did not make time for themselves to relax. The group stated ways they could improve their relaxation techniques in the future.  Individuals participated in the intervention "Time to Relax" where they had a chance to experience different relaxation techniques. Clinical Observations/Feedback:  Patient came to group late due to unknown reasons.  Individual was social with peers and staff while participating in the intervention.    Johnn Krasowski LRT/CTRS         Khia Dieterich 12/22/2018 12:24 PM

## 2018-12-22 NOTE — [facility] Group Notes (Signed)
Feelings Around Diagnosis 12/22/2018 1PM  Type of Therapy/Topic:  Group Therapy:  Feelings about Diagnosis  Participation Level:  Did Not Attend   Description of Group:   This group will allow patients to explore their thoughts and feelings about diagnoses they have received. Patients will be guided to explore their level of understanding and acceptance of these diagnoses. Facilitator will encourage patients to process their thoughts and feelings about the reactions of others to their diagnosis and will guide patients in identifying ways to discuss their diagnosis with significant others in their lives. This group will be process-oriented, with patients participating in exploration of their own experiences, giving and receiving support, and processing challenge from other group members.   Therapeutic Goals: 1. Patient will demonstrate understanding of diagnosis as evidenced by identifying two or more symptoms of the disorder 2. Patient will be able to express two feelings regarding the diagnosis 3. Patient will demonstrate their ability to communicate their needs through discussion and/or role play  Summary of Patient Progress:       Therapeutic Modalities:   Cognitive Behavioral Therapy Brief Therapy Feelings Identification    Yvette Rack, LCSW 12/22/2018 1:50 PM

## 2018-12-22 NOTE — Progress Notes (Signed)
[facility] MD Progress Note  12/22/2018 10:13 AM Peter Parsons  MRN:  [id number] Subjective: Follow-up for patient with schizophrenia and obsessive ideation.  Patient has cleaned himself up gotten dressed more neatly and is out of bed today.  Came to speak with me in the office.  Continues to complain of having this intrusive thoughts that he is going to take his clothing off but still has not done it nor is he done anything else problematic since being up here.  I congratulated him on better hygiene and self-care today.  Once again discussed medication management and diagnosis and prognosis and gave him a lot of encouragement to have patience and keep working on treatment.  Not complaining of any suicidal ideation.  Still however very blunted and flat and withdrawn and uncertain of himself. Principal Problem: Schizophrenia ([place]) Diagnosis: Principal Problem:   Schizophrenia ([place])  Total Time spent with patient: 30 minutes  Past Psychiatric History: Past history of longstanding chronic mental illness  Past Medical History:  Past Medical History:  Diagnosis Date  . Diabetes mellitus without complication ([place])   . Hypertension   . Schizophrenia ([place])    History reviewed. No pertinent surgical history. Family History: History reviewed. No pertinent family history. Family Psychiatric  History: See previous Social History:  Social History   Substance and Sexual Activity  Alcohol Use None     Social History   Substance and Sexual Activity  Drug Use Not on file    Social History   Socioeconomic History  . Marital status: Single    Spouse name: Not on file  . Number of children: Not on file  . Years of education: Not on file  . Highest education level: Not on file  Occupational History  . Not on file  Social Needs  . Financial resource strain: Not on file  . Food insecurity    Worry: Not on file    Inability: Not on file  . Transportation needs    Medical: Not on file   Non-medical: Not on file  Tobacco Use  . Smoking status: Never Smoker  . Smokeless tobacco: Never Used  Substance and Sexual Activity  . Alcohol use: Not on file  . Drug use: Not on file  . Sexual activity: Not on file  Lifestyle  . Physical activity    Days per week: Not on file    Minutes per session: Not on file  . Stress: Not on file  Relationships  . Social Herbalist on phone: Not on file    Gets together: Not on file    Attends religious service: Not on file    Active member of club or organization: Not on file    Attends meetings of clubs or organizations: Not on file    Relationship status: Not on file  Other Topics Concern  . Not on file  Social History Narrative  . Not on file   Additional Social History:                         Sleep: Fair  Appetite:  Fair  Current Medications: Current Facility-Administered Medications  Medication Dose Route Frequency Provider Last Rate Last Dose  . acetaminophen (TYLENOL) tablet 650 mg  650 mg Oral Q6H PRN Cristofano, Dorene Ar, MD      . alum & mag hydroxide-simeth (MAALOX/MYLANTA) 200-200-20 MG/5ML suspension 30 mL  30 mL Oral Q4H PRN Cristofano, Dorene Ar, MD  30 mL at 12/20/18 1222  . benztropine (COGENTIN) tablet 0.5 mg  0.5 mg Oral BID Tylynn Braniff, Madie Reno, MD   0.5 mg at 12/22/18 0819  . clonazePAM (KLONOPIN) tablet 0.5 mg  0.5 mg Oral BID Mitsuo Budnick, Madie Reno, MD   0.5 mg at 12/22/18 0817  . [START ON 12/23/2018] escitalopram (LEXAPRO) tablet 20 mg  20 mg Oral Daily Chandlar Guice T, MD      . gabapentin (NEURONTIN) capsule 300 mg  300 mg Oral TID Jaidan Stachnik, Madie Reno, MD   300 mg at 12/22/18 0817  . lisinopril (ZESTRIL) tablet 20 mg  20 mg Oral Daily Harlyn Italiano, Madie Reno, MD   20 mg at 12/21/18 0807  . loperamide (IMODIUM) capsule 2 mg  2 mg Oral PRN Chessa Barrasso, Madie Reno, MD   2 mg at 12/20/18 2142  . LORazepam (ATIVAN) tablet 1 mg  1 mg Oral Q4H PRN Cristofano, Dorene Ar, MD   1 mg at 12/21/18 2114  . magnesium hydroxide (MILK OF  MAGNESIA) suspension 30 mL  30 mL Oral Daily PRN Cristofano, Paul A, MD      . metFORMIN (GLUCOPHAGE) tablet 500 mg  500 mg Oral BID WC Chawn Spraggins T, MD   500 mg at 12/22/18 0817  . nicotine (NICODERM CQ - dosed in mg/24 hours) patch 14 mg  14 mg Transdermal Daily Katey Barrie, Madie Reno, MD   14 mg at 12/22/18 0817  . [START ON 01/14/2019] paliperidone (INVEGA SUSTENNA) injection 234 mg  234 mg Intramuscular Q28 days Money, Lowry Ram, FNP      . traZODone (DESYREL) tablet 100 mg  100 mg Oral QHS PRN Caroline Sauger, NP   100 mg at 12/21/18 2114    Lab Results:  Results for orders placed or performed during the hospital encounter of 12/15/18 (from the past 48 hour(s))  Glucose, capillary     Status: None   Collection Time: 12/20/18  4:23 PM  Result Value Ref Range   Glucose-Capillary 86 70 - 99 mg/dL   Comment 1 Notify RN   Glucose, capillary     Status: None   Collection Time: 12/21/18  4:04 PM  Result Value Ref Range   Glucose-Capillary 92 70 - 99 mg/dL    Blood Alcohol level:  Lab Results  Component Value Date   ETH <10 99991111    Metabolic Disorder Labs: No results found for: HGBA1C, MPG No results found for: PROLACTIN Lab Results  Component Value Date   CHOL 110 12/16/2018   TRIG 77 12/16/2018   HDL 40 (L) 12/16/2018   CHOLHDL 2.8 12/16/2018   VLDL 15 12/16/2018   LDLCALC 55 12/16/2018    Physical Findings: AIMS:  , ,  ,  ,    CIWA:    COWS:     Musculoskeletal: Strength & Muscle Tone: within normal limits Gait & Station: normal Patient leans: N/A  Psychiatric Specialty Exam: Physical Exam  Nursing note and vitals reviewed. Constitutional: He appears well-developed and well-nourished.  HENT:  Head: Normocephalic and atraumatic.  Eyes: Pupils are equal, round, and reactive to light. Conjunctivae are normal.  Neck: Normal range of motion.  Cardiovascular: Regular rhythm and normal heart sounds.  Respiratory: Effort normal. No respiratory distress.  GI:  Soft.  Musculoskeletal: Normal range of motion.  Neurological: He is alert.  Skin: Skin is warm and dry.  Psychiatric: His affect is blunt. His speech is delayed. He is slowed. Thought content is paranoid. Cognition and memory are impaired. He expresses  impulsivity. He expresses no homicidal and no suicidal ideation.    Review of Systems  Constitutional: Negative.   HENT: Negative.   Eyes: Negative.   Respiratory: Negative.   Cardiovascular: Negative.   Gastrointestinal: Negative.   Musculoskeletal: Negative.   Skin: Negative.   Neurological: Negative.   Psychiatric/Behavioral: Positive for depression. Negative for hallucinations, memory loss, substance abuse and suicidal ideas. The patient is nervous/anxious. The patient does not have insomnia.     Blood pressure 103/64, pulse (!) 106, temperature (!) 97.5 F (36.4 C), temperature source Oral, resp. rate 18, height 5\' 10"  (1.778 m), weight 90.3 kg, SpO2 100 %.Body mass index is 28.55 kg/m.  General Appearance: Casual  Eye Contact:  Fair  Speech:  Clear and Coherent  Volume:  Normal  Mood:  Euthymic  Affect:  Constricted  Thought Process:  Goal Directed  Orientation:  Full (Time, Place, and Person)  Thought Content:  Logical  Suicidal Thoughts:  No  Homicidal Thoughts:  No  Memory:  Immediate;   Fair Recent;   Fair Remote;   Fair  Judgement:  Fair  Insight:  Fair  Psychomotor Activity:  Normal  Concentration:  Concentration: Fair  Recall:  [employer] of Knowledge:  Fair  Language:  Fair  Akathisia:  No  Handed:  Right  AIMS (if indicated):     Assets:  Desire for Improvement Housing Resilience  ADL's:  Intact  Cognition:  WNL  Sleep:  Number of Hours: 7.25     Treatment Plan Summary: Daily contact with patient to assess and evaluate symptoms and progress in treatment, Medication management and Plan Patient still having some obsessive ideation still feeling uncertain and anxious but functioning much better.  Plan  is to increase dose of Lexapro to 20 mg a day.  He seemed a little bit stiff in his walking but on checking his arm there was no sign of cogwheel rigidity did not appear to be having any dystonic reaction.  Patient encouraged to attend groups and try to stay positive today.  We are going to arrange a visit from the group home who need to reassess him before any discharge planning.  I am hopeful that we may be on target for discharge within a couple days.  Alethia Berthold, MD 12/22/2018, 10:13 AM

## 2018-12-22 NOTE — Tx Team (Signed)
Interdisciplinary Treatment and Diagnostic Plan Update  12/22/2018 Time of Session: 9AM Peter Parsons MRN: [id number]  Principal Diagnosis: Schizophrenia [facility])  Secondary Diagnoses: Principal Problem:   Schizophrenia ([place])   Current Medications:  Current Facility-Administered Medications  Medication Dose Route Frequency Provider Last Rate Last Dose  . acetaminophen (TYLENOL) tablet 650 mg  650 mg Oral Q6H PRN Cristofano, Dorene Ar, MD      . alum & mag hydroxide-simeth (MAALOX/MYLANTA) 200-200-20 MG/5ML suspension 30 mL  30 mL Oral Q4H PRN Cristofano, Dorene Ar, MD   30 mL at 12/20/18 1222  . benztropine (COGENTIN) tablet 0.5 mg  0.5 mg Oral BID Clapacs, Madie Reno, MD   0.5 mg at 12/22/18 0819  . clonazePAM (KLONOPIN) tablet 0.5 mg  0.5 mg Oral BID Clapacs, Madie Reno, MD   0.5 mg at 12/22/18 0817  . [START ON 12/23/2018] escitalopram (LEXAPRO) tablet 20 mg  20 mg Oral Daily Clapacs, John T, MD      . gabapentin (NEURONTIN) capsule 300 mg  300 mg Oral TID Clapacs, Madie Reno, MD   300 mg at 12/22/18 0817  . lisinopril (ZESTRIL) tablet 20 mg  20 mg Oral Daily Clapacs, Madie Reno, MD   20 mg at 12/21/18 0807  . loperamide (IMODIUM) capsule 2 mg  2 mg Oral PRN Clapacs, Madie Reno, MD   2 mg at 12/20/18 2142  . LORazepam (ATIVAN) tablet 1 mg  1 mg Oral Q4H PRN Cristofano, Dorene Ar, MD   1 mg at 12/21/18 2114  . magnesium hydroxide (MILK OF MAGNESIA) suspension 30 mL  30 mL Oral Daily PRN Cristofano, Paul A, MD      . metFORMIN (GLUCOPHAGE) tablet 500 mg  500 mg Oral BID WC Clapacs, John T, MD   500 mg at 12/22/18 0817  . nicotine (NICODERM CQ - dosed in mg/24 hours) patch 14 mg  14 mg Transdermal Daily Clapacs, Madie Reno, MD   14 mg at 12/22/18 0817  . [START ON 01/14/2019] paliperidone (INVEGA SUSTENNA) injection 234 mg  234 mg Intramuscular Q28 days Money, Lowry Ram, FNP      . traZODone (DESYREL) tablet 100 mg  100 mg Oral QHS PRN Caroline Sauger, NP   100 mg at 12/21/18 2114   PTA Medications: Medications Prior  to Admission  Medication Sig Dispense Refill Last Dose  . buPROPion (WELLBUTRIN XL) 150 MG 24 hr tablet Take 150 mg by mouth daily.   12/14/2018 at 0800  . gabapentin (NEURONTIN) 300 MG capsule Take 300 mg by mouth 3 (three) times daily.   12/14/2018 at 0800  . KLONOPIN 1 MG tablet Take 1 mg by mouth 3 (three) times daily.   12/14/2018 at 0800  . lisinopril-hydrochlorothiazide (ZESTORETIC) 20-25 MG tablet Take 1 tablet by mouth daily.   12/14/2018 at 0800  . metFORMIN (GLUCOPHAGE) 1000 MG tablet Take 1,000 mg by mouth 2 (two) times daily.   12/14/2018 at 0800  . paliperidone (INVEGA) 6 MG 24 hr tablet Take 6 mg by mouth daily.   12/14/2018 at 0800  . prazosin (MINIPRESS) 1 MG capsule Take 1 mg by mouth at bedtime.   Past Week at Unknown time  . risperiDONE microspheres (RISPERDAL CONSTA) 25 MG injection Inject 25 mg into the muscle every 14 (fourteen) days.   12/13/2018 at Unknown time  . simvastatin (ZOCOR) 20 MG tablet Take 20 mg by mouth daily.   12/14/2018 at 0800  . tamsulosin (FLOMAX) 0.4 MG CAPS capsule Take 0.4 mg by mouth daily.  12/14/2018 at 0800  . traZODone (DESYREL) 150 MG tablet Take 150 mg by mouth at bedtime as needed.   12/13/2018 at Unknown time  . ZYPREXA 15 MG tablet Take 15 mg by mouth at bedtime.   12/13/2018 at Unknown time    Patient Stressors: Financial difficulties Legal issue Medication change or noncompliance  Patient Strengths: Ability for insight Communication skills  Treatment Modalities: Medication Management, Group therapy, Case management,  1 to 1 session with clinician, Psychoeducation, Recreational therapy.   Physician Treatment Plan for Primary Diagnosis: Schizophrenia ([place]) Long Term Goal(s): Improvement in symptoms so as ready for discharge Improvement in symptoms so as ready for discharge   Short Term Goals: Ability to maintain clinical measurements within normal limits will improve Compliance with prescribed medications will improve Ability to disclose  and discuss suicidal ideas Ability to demonstrate self-control will improve  Medication Management: Evaluate patient's response, side effects, and tolerance of medication regimen.  Therapeutic Interventions: 1 to 1 sessions, Unit Group sessions and Medication administration.  Evaluation of Outcomes: Progressing  Physician Treatment Plan for Secondary Diagnosis: Principal Problem:   Schizophrenia ([place])  Long Term Goal(s): Improvement in symptoms so as ready for discharge Improvement in symptoms so as ready for discharge   Short Term Goals: Ability to maintain clinical measurements within normal limits will improve Compliance with prescribed medications will improve Ability to disclose and discuss suicidal ideas Ability to demonstrate self-control will improve     Medication Management: Evaluate patient's response, side effects, and tolerance of medication regimen.  Therapeutic Interventions: 1 to 1 sessions, Unit Group sessions and Medication administration.  Evaluation of Outcomes: Progressing   RN Treatment Plan for Primary Diagnosis: Schizophrenia ([place]) Long Term Goal(s): Knowledge of disease and therapeutic regimen to maintain health will improve  Short Term Goals: Ability to demonstrate self-control, Ability to participate in decision making will improve, Ability to verbalize feelings will improve and Ability to identify and develop effective coping behaviors will improve  Medication Management: RN will administer medications as ordered by provider, will assess and evaluate patient's response and provide education to patient for prescribed medication. RN will report any adverse and/or side effects to prescribing provider.  Therapeutic Interventions: 1 on 1 counseling sessions, Psychoeducation, Medication administration, Evaluate responses to treatment, Monitor vital signs and CBGs as ordered, Perform/monitor CIWA, COWS, AIMS and Fall Risk screenings as ordered, Perform wound  care treatments as ordered.  Evaluation of Outcomes: Progressing   LCSW Treatment Plan for Primary Diagnosis: Schizophrenia ([place]) Long Term Goal(s): Safe transition to appropriate next level of care at discharge, Engage patient in therapeutic group addressing interpersonal concerns.  Short Term Goals: Engage patient in aftercare planning with referrals and resources, Increase social support, Increase ability to appropriately verbalize feelings, Increase emotional regulation, Facilitate acceptance of mental health diagnosis and concerns and Increase skills for wellness and recovery  Therapeutic Interventions: Assess for all discharge needs, 1 to 1 time with Social worker, Explore available resources and support systems, Assess for adequacy in community support network, Educate family and significant other(s) on suicide prevention, Complete Psychosocial Assessment, Interpersonal group therapy.  Evaluation of Outcomes: Progressing   Progress in Treatment: Attending groups: No. Participating in groups: No. Taking medication as prescribed: Yes. Toleration medication: Yes. Family/Significant other contact made: Yes, individual(s) contacted:   completed with the patient's legal guardian.  Patient understands diagnosis: No. Discussing patient identified problems/goals with staff: Yes. Medical problems stabilized or resolved: Yes. Denies suicidal/homicidal ideation: Yes. Issues/concerns per patient self-inventory: No. Other: none  New problem(s) identified: No, Describe:  none  New Short Term/Long Term Goal(s): detox, elimination of symptoms of psychosis, medication management for mood stabilization; elimination of SI thoughts; development of comprehensive mental wellness/sobriety plan.  Patient Goals:  "I just want to get to a place where I can live in the group home successfully"  Discharge Plan or Barriers: Pt will follow up with his ACT team with [facility].  CSW is still assessing if  patient can return to the group home. UPDATE 12/22/2018:  CSW staff will schedule for the patient's group home to come visit with the patient to determine if patient will be allowed to return to the home.  Once scheduled for discharge CSW staff will reach out to [facility] ACT team to verify when they will follow up with the patient.  Reason for Continuation of Hospitalization: Anxiety Depression Mania Medical Issues Suicidal ideation  Estimated Length of Stay: 1-7 days  Recreational Therapy: Patient: N/A Patient Goal: Patient will engage in groups without prompting or encouragement from LRT x3 group sessions within 5 recreation therapy group sessions  Attendees: Patient:  12/22/2018 10:10 AM  Physician: Dr. Weber Cooks, MD 12/22/2018 10:10 AM  Nursing:  12/22/2018 10:10 AM  RN Care Manager: 12/22/2018 10:10 AM  Social Worker: Assunta Curtis, LCSW 12/22/2018 10:10 AM  Recreational Therapist:  12/22/2018 10:10 AM  Other:  12/22/2018 10:10 AM  Other:  12/22/2018 10:10 AM  Other: 12/22/2018 10:10 AM    Scribe for Treatment Team: Rozann Lesches, LCSW 12/22/2018 10:10 AM

## 2018-12-22 NOTE — Plan of Care (Signed)
Patient had a good shower today.Patient verbalized that he is hopeful that he can take care of himself. Patient is not telling that often that he is scared if he is going to take his cloths off.Patient is slow and appears with a blunted affect.Denies SI,HI and AVH.Compliant with medications.Attended groups.Appetite fair.Support and encouragement given.

## 2018-12-23 LAB — GLUCOSE, CAPILLARY: Glucose-Capillary: 133 mg/dL — ABNORMAL HIGH (ref 70–99)

## 2018-12-23 NOTE — Progress Notes (Signed)
Recreation Therapy Notes  Date: 12/23/2018  Time: 9:30 am   Location: Craft room   Behavioral response: N/A   Intervention Topic: Problem Solving  Discussion/Intervention: Patient did not attend group.   Clinical Observations/Feedback:  Patient did not attend group.   Lailanie Hasley LRT/CTRS        Bridgette Wolden 12/23/2018 10:47 AM

## 2018-12-23 NOTE — [facility] Group Notes (Signed)
[facility] Group Notes:  (Nursing/MHT/Case Management/Adjunct)  Date:  12/23/2018  Time:  7:33 AM  Type of Therapy:  Group Therapy  Participation Level:  Active  Participation Quality:  Appropriate  Affect:  Appropriate  Cognitive:  Alert  Insight:  Good  Engagement in Group:  Engaged  Modes of Intervention:  Support  Summary of Progress/Problems:  Peter Parsons 12/23/2018, 7:33 AM

## 2018-12-23 NOTE — [facility] Group Notes (Signed)
LCSW Group Therapy Note  12/23/2018 2:06 PM  Type of Therapy/Topic:  Group Therapy:  Emotion Regulation  Participation Level:  Did Not Attend   Description of Group:   The purpose of this group is to assist patients in learning to regulate negative emotions and experience positive emotions. Patients will be guided to discuss ways in which they have been vulnerable to their negative emotions. These vulnerabilities will be juxtaposed with experiences of positive emotions or situations, and patients will be challenged to use positive emotions to combat negative ones. Special emphasis will be placed on coping with negative emotions in conflict situations, and patients will process healthy conflict resolution skills.  Therapeutic Goals: 1. Patient will identify two positive emotions or experiences to reflect on in order to balance out negative emotions 2. Patient will label two or more emotions that they find the most difficult to experience 3. Patient will demonstrate positive conflict resolution skills through discussion and/or role plays  Summary of Patient Progress: x   Therapeutic Modalities:   Cognitive Behavioral Therapy Feelings Identification Dialectical Behavioral Therapy   Evalina Field, MSW, LCSW Clinical Social Work 12/23/2018 2:06 PM

## 2018-12-23 NOTE — Plan of Care (Signed)
Pt rates depression, anxiety and hopelessness at 10/10. Pt denies SI, HI and AVH. Pt was educated on care plan and verbalizes understanding. Collier Bullock RN Problem: Education: Goal: Knowledge of [facility] Education information/materials will improve Outcome: Progressing   Problem: Coping: Goal: Ability to verbalize frustrations and anger appropriately will improve Outcome: Progressing Goal: Ability to demonstrate self-control will improve Outcome: Progressing   Problem: Safety: Goal: Periods of time without injury will increase Outcome: Progressing

## 2018-12-23 NOTE — Progress Notes (Signed)
[facility] MD Progress Note  12/23/2018 12:04 PM Peter Parsons  MRN:  [id number]   Subjective:  This is a follow up with a patient with Schizophrenia. Patient states today that he is doing okay.  He reports that he continues to have the thoughts of getting undressed in front of women and then states that he knows he does not want to do it but he feels that these thoughts can become overwhelming at times and he may actually do it.  Patient states that he understands that these thoughts may not go away and that he may need some intensive treatment after he is discharged from the hospital.  The patient reports that he has been getting up and going to meals and he has been getting up and showering.  He also reports that her sleep has been good.  He denies any medication side effects.  Principal Problem: Schizophrenia ([place]) Diagnosis: Principal Problem:   Schizophrenia ([place])  Total Time spent with patient: 20 minutes  Past Psychiatric History: Patient has a long history of schizophrenia.  Multiple prior hospitalizations.  Multiple medications.  He tells me that the long-acting Invega injection he had been receiving in the past was most effective.  For many years he had lived with his parents but more recently had been in jail and now on release is being placed in a group home for the first time in his life.  He admits to having had past suicide attempts but the last time he said was several years ago.  He denies ever having been violent.  Past Medical History:  Past Medical History:  Diagnosis Date  . Diabetes mellitus without complication ([place])   . Hypertension   . Schizophrenia ([place])    History reviewed. No pertinent surgical history. Family History: History reviewed. No pertinent family history. Family Psychiatric  History: None known Social History:  Social History   Substance and Sexual Activity  Alcohol Use None     Social History   Substance and Sexual Activity  Drug Use Not on file     Social History   Socioeconomic History  . Marital status: Single    Spouse name: Not on file  . Number of children: Not on file  . Years of education: Not on file  . Highest education level: Not on file  Occupational History  . Not on file  Social Needs  . Financial resource strain: Not on file  . Food insecurity    Worry: Not on file    Inability: Not on file  . Transportation needs    Medical: Not on file    Non-medical: Not on file  Tobacco Use  . Smoking status: Never Smoker  . Smokeless tobacco: Never Used  Substance and Sexual Activity  . Alcohol use: Not on file  . Drug use: Not on file  . Sexual activity: Not on file  Lifestyle  . Physical activity    Days per week: Not on file    Minutes per session: Not on file  . Stress: Not on file  Relationships  . Social Herbalist on phone: Not on file    Gets together: Not on file    Attends religious service: Not on file    Active member of club or organization: Not on file    Attends meetings of clubs or organizations: Not on file    Relationship status: Not on file  Other Topics Concern  . Not on file  Social History  Narrative  . Not on file   Additional Social History:                         Sleep: Good  Appetite:  Fair  Current Medications: Current Facility-Administered Medications  Medication Dose Route Frequency Provider Last Rate Last Dose  . acetaminophen (TYLENOL) tablet 650 mg  650 mg Oral Q6H PRN Cristofano, Dorene Ar, MD      . alum & mag hydroxide-simeth (MAALOX/MYLANTA) 200-200-20 MG/5ML suspension 30 mL  30 mL Oral Q4H PRN Cristofano, Dorene Ar, MD   30 mL at 12/20/18 1222  . benztropine (COGENTIN) tablet 0.5 mg  0.5 mg Oral BID Clapacs, Madie Reno, MD   0.5 mg at 12/23/18 0856  . clonazePAM (KLONOPIN) tablet 0.5 mg  0.5 mg Oral BID Clapacs, Madie Reno, MD   0.5 mg at 12/23/18 0855  . escitalopram (LEXAPRO) tablet 20 mg  20 mg Oral Daily Clapacs, Madie Reno, MD   20 mg at 12/23/18 0856  .  gabapentin (NEURONTIN) capsule 300 mg  300 mg Oral TID Clapacs, Madie Reno, MD   300 mg at 12/23/18 0855  . lisinopril (ZESTRIL) tablet 20 mg  20 mg Oral Daily Clapacs, Madie Reno, MD   20 mg at 12/21/18 0807  . loperamide (IMODIUM) capsule 2 mg  2 mg Oral PRN Clapacs, Madie Reno, MD   2 mg at 12/20/18 2142  . LORazepam (ATIVAN) tablet 1 mg  1 mg Oral Q4H PRN Cristofano, Dorene Ar, MD   1 mg at 12/22/18 2120  . magnesium hydroxide (MILK OF MAGNESIA) suspension 30 mL  30 mL Oral Daily PRN Cristofano, Paul A, MD      . metFORMIN (GLUCOPHAGE) tablet 500 mg  500 mg Oral BID WC Clapacs, John T, MD   500 mg at 12/23/18 0856  . nicotine (NICODERM CQ - dosed in mg/24 hours) patch 14 mg  14 mg Transdermal Daily Clapacs, Madie Reno, MD   14 mg at 12/23/18 0859  . [START ON 01/14/2019] paliperidone (INVEGA SUSTENNA) injection 234 mg  234 mg Intramuscular Q28 days , Lowry Ram, FNP      . traZODone (DESYREL) tablet 100 mg  100 mg Oral QHS PRN Caroline Sauger, NP   100 mg at 12/22/18 2121    Lab Results:  Results for orders placed or performed during the hospital encounter of 12/15/18 (from the past 48 hour(s))  Glucose, capillary     Status: None   Collection Time: 12/21/18  4:04 PM  Result Value Ref Range   Glucose-Capillary 92 70 - 99 mg/dL  Glucose, capillary     Status: Abnormal   Collection Time: 12/23/18  9:12 AM  Result Value Ref Range   Glucose-Capillary 133 (H) 70 - 99 mg/dL    Blood Alcohol level:  Lab Results  Component Value Date   ETH <10 99991111    Metabolic Disorder Labs: No results found for: HGBA1C, MPG No results found for: PROLACTIN Lab Results  Component Value Date   CHOL 110 12/16/2018   TRIG 77 12/16/2018   HDL 40 (L) 12/16/2018   CHOLHDL 2.8 12/16/2018   VLDL 15 12/16/2018   LDLCALC 55 12/16/2018    Physical Findings: AIMS:  , ,  ,  ,    CIWA:    COWS:     Musculoskeletal: Strength & Muscle Tone: within normal limits and decreased Gait & Station: shuffle Patient  leans: N/A  Psychiatric Specialty Exam: Physical Exam  Nursing note and vitals reviewed. Constitutional: He is oriented to person, place, and time. He appears well-developed and well-nourished.  Respiratory: Effort normal.  Musculoskeletal: Normal range of motion.  Neurological: He is alert and oriented to person, place, and time.  Skin: Skin is warm.    Review of Systems  Constitutional: Negative.   HENT: Negative.   Eyes: Negative.   Respiratory: Negative.   Cardiovascular: Negative.   Gastrointestinal: Negative.   Genitourinary: Negative.   Musculoskeletal: Negative.   Skin: Negative.   Neurological: Negative.   Endo/Heme/Allergies: Negative.   Psychiatric/Behavioral: Positive for depression. The patient is nervous/anxious.        Intrusive thoughts    Blood pressure 94/69, pulse (!) 115, temperature 98.4 F (36.9 C), temperature source Oral, resp. rate 18, height 5\' 10"  (1.778 m), weight 90.3 kg, SpO2 99 %.Body mass index is 28.55 kg/m.  General Appearance: Disheveled  Eye Contact:  Good  Speech:  Clear and Coherent and Slow  Volume:  Decreased  Mood:  Anxious and Depressed  Affect:  Constricted  Thought Process:  Linear  Orientation:  Full (Time, Place, and Person)  Thought Content:  Paranoid Ideation  Suicidal Thoughts:  No  Homicidal Thoughts:  No  Memory:  Immediate;   Fair Recent;   Fair Remote;   Fair  Judgement:  Fair  Insight:  Fair  Psychomotor Activity:  Decreased  Concentration:  Concentration: Fair  Recall:  [employer] of Knowledge:  Fair  Language:  Fair  Akathisia:  No  Handed:  Right  AIMS (if indicated):     Assets:  Desire for Improvement Housing Physical Health Resilience  ADL's:  Impaired  Cognition:  Impaired,  Mild  Sleep:  Number of Hours: 7.5   Assessment: Patient presents in his room lying in his bed but is awake.  Patient continues to have reports of having intrusive thoughts about wanting to get naked in front of women but  states that he knows he does not want to but he is afraid that the thoughts will become to the point that he cannot control him anymore.  He continues to deny any suicidal homicidal ideations and denies any hallucinations.  Lexapro was increased to 20 mg yesterday and will continue the patient on the current medications and monitor for improvement.  Treatment Plan Summary: Daily contact with patient to assess and evaluate symptoms and progress in treatment and Medication management  Continue Cogentin 0.5 mg PO BID for EPS Continue Lexapro 20 mg PO Daily for depression and thoughts Continue Gabapentin 300 mg PO TID for mood stability Continue Ativan 1 mg PO Q4H PRN for anxiety Continue Invega Sustenna 234 mg IM with next dose due on 01-14-19 for schizophrenia Continue Trazodone 100 mg PO QHS PRN for insomnia Encourage group therapy participation Continue Q15 minute safety checks  Lowry Ram , FNP 12/23/2018, 12:04 PM

## 2018-12-23 NOTE — Progress Notes (Signed)
Patient alert and oriented x 4, affect is flat but brightens upon approach no distress noted, his thoughts are organized and coherent, interacting appropriately with peers and staff, 15 minutes safety checks maintained will continue to monitor

## 2018-12-23 NOTE — Progress Notes (Signed)
D - Patient was in his room upon arrival to the unit. Patient was pleasant during assessment denying SI/HI/AVH and pain. Patient endorses anxiety and depression. Patient was paranoid and isolative to his room this evening.   A - Patient didn't have any scheduled medications this evening. Patient given education. Patient given support and encouragement to be active in his treatment plan. Patient informed to let staff know if there are any issues or problems on the unit.   R - Patient being monitored Q 15 minutes for safety per unit protocol. Patient remains safe on the unit

## 2018-12-23 NOTE — Plan of Care (Signed)
Patient isolative to hir room this evening.   Problem: Coping: Goal: Ability to verbalize frustrations and anger appropriately will improve Outcome: Not Progressing

## 2018-12-24 NOTE — Progress Notes (Signed)
[facility] MD Progress Note  12/24/2018 12:17 PM Peter Parsons  MRN:  [id number] Subjective: Patient seen chart reviewed.  Patient says he is feeling "about average" today.  Still does not get out of bed very much.  Still talks about this worry that he is going to take his clothes off.  Patient has not shown any aggressive or dangerous behavior.  Tolerating medication okay.  We have made a plan for the representative from the group home to come by today and talk with him about discharge planning. Principal Problem: Schizophrenia ([place]) Diagnosis: Principal Problem:   Schizophrenia ([place])  Total Time spent with patient: 30 minutes  Past Psychiatric History: Patient has a long history of schizophrenia but mostly living at his parents house only recently transitioning to group homes  Past Medical History:  Past Medical History:  Diagnosis Date  . Diabetes mellitus without complication ([place])   . Hypertension   . Schizophrenia ([place])    History reviewed. No pertinent surgical history. Family History: History reviewed. No pertinent family history. Family Psychiatric  History: See previous Social History:  Social History   Substance and Sexual Activity  Alcohol Use None     Social History   Substance and Sexual Activity  Drug Use Not on file    Social History   Socioeconomic History  . Marital status: Single    Spouse name: Not on file  . Number of children: Not on file  . Years of education: Not on file  . Highest education level: Not on file  Occupational History  . Not on file  Social Needs  . Financial resource strain: Not on file  . Food insecurity    Worry: Not on file    Inability: Not on file  . Transportation needs    Medical: Not on file    Non-medical: Not on file  Tobacco Use  . Smoking status: Never Smoker  . Smokeless tobacco: Never Used  Substance and Sexual Activity  . Alcohol use: Not on file  . Drug use: Not on file  . Sexual activity: Not on file  Lifestyle   . Physical activity    Days per week: Not on file    Minutes per session: Not on file  . Stress: Not on file  Relationships  . Social Herbalist on phone: Not on file    Gets together: Not on file    Attends religious service: Not on file    Active member of club or organization: Not on file    Attends meetings of clubs or organizations: Not on file    Relationship status: Not on file  Other Topics Concern  . Not on file  Social History Narrative  . Not on file   Additional Social History:                         Sleep: Fair  Appetite:  Fair  Current Medications: Current Facility-Administered Medications  Medication Dose Route Frequency Provider Last Rate Last Dose  . acetaminophen (TYLENOL) tablet 650 mg  650 mg Oral Q6H PRN Cristofano, Dorene Ar, MD      . alum & mag hydroxide-simeth (MAALOX/MYLANTA) 200-200-20 MG/5ML suspension 30 mL  30 mL Oral Q4H PRN Cristofano, Dorene Ar, MD   30 mL at 12/20/18 1222  . benztropine (COGENTIN) tablet 0.5 mg  0.5 mg Oral BID Amit Leece, Madie Reno, MD   0.5 mg at 12/24/18 0824  . clonazePAM (KLONOPIN)  tablet 0.5 mg  0.5 mg Oral BID Makenah Karas, Madie Reno, MD   0.5 mg at 12/24/18 0824  . escitalopram (LEXAPRO) tablet 20 mg  20 mg Oral Daily Chrishelle Zito, Madie Reno, MD   20 mg at 12/24/18 0824  . gabapentin (NEURONTIN) capsule 300 mg  300 mg Oral TID Saphia Vanderford, Madie Reno, MD   300 mg at 12/24/18 1216  . lisinopril (ZESTRIL) tablet 20 mg  20 mg Oral Daily Tajai Suder, Madie Reno, MD   20 mg at 12/24/18 0824  . loperamide (IMODIUM) capsule 2 mg  2 mg Oral PRN Tayloranne Lekas, Madie Reno, MD   2 mg at 12/20/18 2142  . LORazepam (ATIVAN) tablet 1 mg  1 mg Oral Q4H PRN Cristofano, Dorene Ar, MD   1 mg at 12/22/18 2120  . magnesium hydroxide (MILK OF MAGNESIA) suspension 30 mL  30 mL Oral Daily PRN Cristofano, Paul A, MD      . metFORMIN (GLUCOPHAGE) tablet 500 mg  500 mg Oral BID WC Adisa Vigeant, Madie Reno, MD   500 mg at 12/24/18 0824  . nicotine (NICODERM CQ - dosed in mg/24 hours) patch  14 mg  14 mg Transdermal Daily Bettyjane Shenoy, Madie Reno, MD   14 mg at 12/24/18 0825  . [START ON 01/14/2019] paliperidone (INVEGA SUSTENNA) injection 234 mg  234 mg Intramuscular Q28 days Money, Lowry Ram, FNP      . traZODone (DESYREL) tablet 100 mg  100 mg Oral QHS PRN Caroline Sauger, NP   100 mg at 12/22/18 2121    Lab Results:  Results for orders placed or performed during the hospital encounter of 12/15/18 (from the past 48 hour(s))  Glucose, capillary     Status: Abnormal   Collection Time: 12/23/18  9:12 AM  Result Value Ref Range   Glucose-Capillary 133 (H) 70 - 99 mg/dL    Blood Alcohol level:  Lab Results  Component Value Date   ETH <10 99991111    Metabolic Disorder Labs: No results found for: HGBA1C, MPG No results found for: PROLACTIN Lab Results  Component Value Date   CHOL 110 12/16/2018   TRIG 77 12/16/2018   HDL 40 (L) 12/16/2018   CHOLHDL 2.8 12/16/2018   VLDL 15 12/16/2018   LDLCALC 55 12/16/2018    Physical Findings: AIMS:  , ,  ,  ,    CIWA:    COWS:     Musculoskeletal: Strength & Muscle Tone: within normal limits Gait & Station: normal Patient leans: N/A  Psychiatric Specialty Exam: Physical Exam  Nursing note and vitals reviewed. Constitutional: He appears well-developed and well-nourished.  HENT:  Head: Normocephalic and atraumatic.  Eyes: Pupils are equal, round, and reactive to light. Conjunctivae are normal.  Neck: Normal range of motion.  Cardiovascular: Regular rhythm and normal heart sounds.  Respiratory: Effort normal. No respiratory distress.  GI: Soft.  Musculoskeletal: Normal range of motion.  Neurological: He is alert.  Skin: Skin is warm and dry.  Psychiatric: His speech is delayed. He is slowed. Thought content is paranoid. Cognition and memory are impaired. He expresses impulsivity. He expresses no homicidal and no suicidal ideation.    Review of Systems  Constitutional: Negative.   HENT: Negative.   Eyes: Negative.    Respiratory: Negative.   Cardiovascular: Negative.   Gastrointestinal: Negative.   Musculoskeletal: Negative.   Skin: Negative.   Neurological: Negative.   Psychiatric/Behavioral: Negative for depression, hallucinations, substance abuse and suicidal ideas. The patient is nervous/anxious.     Blood pressure  130/81, pulse 96, temperature 98.9 F (37.2 C), temperature source Oral, resp. rate 18, height 5\' 10"  (1.778 m), weight 90.3 kg, SpO2 99 %.Body mass index is 28.55 kg/m.  General Appearance: Casual  Eye Contact:  Fair  Speech:  Slow  Volume:  Decreased  Mood:  Dysphoric  Affect:  Constricted  Thought Process:  Goal Directed  Orientation:  Full (Time, Place, and Person)  Thought Content:  Illogical, Obsessions and Paranoid Ideation  Suicidal Thoughts:  No  Homicidal Thoughts:  No  Memory:  Immediate;   Fair Recent;   Fair Remote;   Fair  Judgement:  Fair  Insight:  Fair  Psychomotor Activity:  Normal  Concentration:  Concentration: Fair  Recall:  [employer] of Knowledge:  Fair  Language:  Fair  Akathisia:  No  Handed:  Right  AIMS (if indicated):     Assets:  Desire for Improvement Resilience  ADL's:  Impaired  Cognition:  Impaired,  Mild  Sleep:  Number of Hours: 8     Treatment Plan Summary: Daily contact with patient to assess and evaluate symptoms and progress in treatment, Medication management and Plan Encourage patient to get up out of bed and interact with others on the unit take care of his hygiene and go to groups.  He is aware that he is supposed to talk with people from the group home today and that we may be looking at discharge within the next day or so.  Alethia Berthold, MD 12/24/2018, 12:17 PM

## 2018-12-24 NOTE — NC FL2 (Signed)
[place] LEVEL OF CARE SCREENING TOOL     IDENTIFICATION  Patient Name: Peter Parsons Birthdate: 08/15/1960 Sex: male Admission Date (Current Location): 12/15/2018  [place] and [place] Number:  [name] [id number] [place] and Address:  [facility], [address] [place], [place] [address]      Provider Number: [id number]  Attending Physician Name and Address:  Gonzella Lex, MD  Relative Name and Phone Number:  Lucio Edward, legal guardian [phone]    Current Level of Care: Hospital Recommended Level of Care: [facility], Other (Comment)(group home) Prior Approval Number:    Date Approved/Denied:   PASRR Number:    Discharge Plan: Other (Comment)([facility] or group home)    Current Diagnoses: Patient Active Problem List   Diagnosis Date Noted  . Schizophrenia ([place]) 12/15/2018  . Paranoid schizophrenia ([place])     Orientation RESPIRATION BLADDER Height & Weight     Self, Time, Situation, Place  Normal Continent Weight: 199 lb (90.3 kg) Height:  5\' 10"  (177.8 cm)  BEHAVIORAL SYMPTOMS/MOOD NEUROLOGICAL BOWEL NUTRITION STATUS  Wanderer(Wanderer by history) (none reported) Continent Diet(normal)  AMBULATORY STATUS COMMUNICATION OF NEEDS Skin   Independent Verbally Normal                       Personal Care Assistance Level of Assistance  (none reported)           Functional Limitations Info  (none reported)          SPECIAL CARE FACTORS FREQUENCY  (none reported)                    Contractures Contractures Info: Not present    Additional Factors Info  Code Status Code Status Info: Full             Current Medications (12/24/2018):  This is the current hospital active medication list Current Facility-Administered Medications  Medication Dose Route Frequency Provider Last Rate Last Dose  . acetaminophen (TYLENOL) tablet 650 mg  650 mg Oral Q6H PRN Cristofano, Dorene Ar, MD      . alum & mag  hydroxide-simeth (MAALOX/MYLANTA) 200-200-20 MG/5ML suspension 30 mL  30 mL Oral Q4H PRN Cristofano, Dorene Ar, MD   30 mL at 12/20/18 1222  . benztropine (COGENTIN) tablet 0.5 mg  0.5 mg Oral BID Clapacs, Madie Reno, MD   0.5 mg at 12/24/18 0824  . clonazePAM (KLONOPIN) tablet 0.5 mg  0.5 mg Oral BID Clapacs, Madie Reno, MD   0.5 mg at 12/24/18 0824  . escitalopram (LEXAPRO) tablet 20 mg  20 mg Oral Daily Clapacs, Madie Reno, MD   20 mg at 12/24/18 0824  . gabapentin (NEURONTIN) capsule 300 mg  300 mg Oral TID Clapacs, Madie Reno, MD   300 mg at 12/24/18 1216  . lisinopril (ZESTRIL) tablet 20 mg  20 mg Oral Daily Clapacs, Madie Reno, MD   20 mg at 12/24/18 0824  . loperamide (IMODIUM) capsule 2 mg  2 mg Oral PRN Clapacs, Madie Reno, MD   2 mg at 12/20/18 2142  . LORazepam (ATIVAN) tablet 1 mg  1 mg Oral Q4H PRN Cristofano, Dorene Ar, MD   1 mg at 12/22/18 2120  . magnesium hydroxide (MILK OF MAGNESIA) suspension 30 mL  30 mL Oral Daily PRN Cristofano, Paul A, MD      . metFORMIN (GLUCOPHAGE) tablet 500 mg  500 mg Oral BID WC Clapacs, Madie Reno, MD   500 mg  at 12/24/18 0824  . nicotine (NICODERM CQ - dosed in mg/24 hours) patch 14 mg  14 mg Transdermal Daily Clapacs, Madie Reno, MD   14 mg at 12/24/18 0825  . [START ON 01/14/2019] paliperidone (INVEGA SUSTENNA) injection 234 mg  234 mg Intramuscular Q28 days Money, Lowry Ram, FNP      . traZODone (DESYREL) tablet 100 mg  100 mg Oral QHS PRN Caroline Sauger, NP   100 mg at 12/22/18 2121     Discharge Medications: Please see discharge summary for a list of discharge medications.  Relevant Imaging Results:  Relevant Lab Results:   Additional [place], LCSW

## 2018-12-24 NOTE — Plan of Care (Signed)
Patient stated he feels the urge to expose himself in front of staff but has been able to resist   Problem: Coping: Goal: Ability to demonstrate self-control will improve Outcome: Progressing

## 2018-12-24 NOTE — Plan of Care (Signed)
D- Patient alert and oriented. Patient presents in a pleasant mood on assessment stating that he didn't sleep well, reporting that he "tossed and turned" last night, however, he did not have any complaints to voice to this Probation officer. Patient endorsed passive SI, stating that he's feeling this way because he's depressed. Patient endorsed depression and anxiety, rating them both a "10/10", stating that "I'm afraid I'm going to strip naked in front of staff". Patient denies HI, AVH, and pain at this time. Patient's goal for today is to "pray", in which he will "gain patience" in order to achieve his goal.  A- Scheduled medications administered to patient, per MD orders. Support and encouragement provided.  Routine safety checks conducted every 15 minutes.  Patient informed to notify staff with problems or concerns.  R- No adverse drug reactions noted. Patient contracts for safety at this time. Patient compliant with medications and treatment plan. Patient receptive, calm, and cooperative. Patient interacts well with others on the unit.  Patient remains safe at this time.  Problem: Education: Goal: Knowledge of [place] [facility] Education information/materials will improve Outcome: Progressing   Problem: Coping: Goal: Ability to verbalize frustrations and anger appropriately will improve Outcome: Progressing Goal: Ability to demonstrate self-control will improve Outcome: Progressing   Problem: Safety: Goal: Periods of time without injury will increase Outcome: Progressing

## 2018-12-24 NOTE — Progress Notes (Signed)
Recreation Therapy Notes  Date: 12/24/2018  Time: 9:30 am   Location: Craft room   Behavioral response: N/A   Intervention Topic: Emotions  Discussion/Intervention: Patient did not attend group.   Clinical Observations/Feedback:  Patient did not attend group.   Calvina Liptak LRT/CTRS         Barth Trella 12/24/2018 10:50 AM

## 2018-12-24 NOTE — BHH Counselor (Signed)
CSW left confidential vm for legal guardian and care coordinator informing them of housing issue, awaiting response. CSW spoke with Craig(ACT team lead) and informed him group home owner will not allow him to return to the home. Cecilie Lowers request FL2 and says they will start looking for new group home.

## 2018-12-24 NOTE — BHH Counselor (Signed)
CSW received phone call from Ms. Elvis Coil at [facility] who reports she visited with the pt today and she will not allow the pt to return to the group home. She reports because he wanders from the group home he is a threat to himself. In addition, she raised concern about the pt still communicating his urge to undress in front of others. CSW asked if he will receive a 30 day notice and she states he will not because he is a threat to himself.

## 2018-12-24 NOTE — BHH Group Notes (Signed)
LCSW Group Therapy Note  12/24/2018 1:00 PM  Type of Therapy/Topic:  Group Therapy:  Balance in Life  Participation Level:  Did Not Attend  Description of Group:    This group will address the concept of balance and how it feels and looks when one is unbalanced. Patients will be encouraged to process areas in their lives that are out of balance and identify reasons for remaining unbalanced. Facilitators will guide patients in utilizing problem-solving interventions to address and correct the stressor making their life unbalanced. Understanding and applying boundaries will be explored and addressed for obtaining and maintaining a balanced life. Patients will be encouraged to explore ways to assertively make their unbalanced needs known to significant others in their lives, using other group members and facilitator for support and feedback.  Therapeutic Goals: 1. Patient will identify two or more emotions or situations they have that consume much of in their lives. 2. Patient will identify signs/triggers that life has become out of balance:  3. Patient will identify two ways to set boundaries in order to achieve balance in their lives:  4. Patient will demonstrate ability to communicate their needs through discussion and/or role plays  Summary of Patient Progress: X     Therapeutic Modalities:   Cognitive Behavioral Therapy Solution-Focused Therapy Assertiveness Training  Assunta Curtis MSW, LCSW 12/24/2018 2:30 PM

## 2018-12-24 NOTE — Progress Notes (Signed)
D - Patient was in his room upon arrival to the unit. Patient was pleasant during assessment denying SI/HI/AVH and pain. Patient endorses anxiety and depression. Patient stated he thinks he needs more time here before he should go back to the group home.   A - Patient didn't have any scheduled medications this evening. Patient given education. Patient given support and encouragement to be active in his treatment plan. Patient informed to let staff know if there are any issues or problems on the unit.   R - Patient being monitored Q 15 minutes for safety per unit protocol. Patient remains safe on the unit

## 2018-12-25 NOTE — BHH Counselor (Signed)
CSW contacted the following to determine if any beds are available:  [facility] [phone]  No beds.  [facility] [phone]  Call rang continuously with no option to leave a voicemail.  [facility] (5) [phone]  Phone rang then became the noise of a fax machine.  [facility] [phone]  CSW left HIPAA compliant voicemail.  [facility]  [phone] No male beds.  Male beds available at [phone], [place] #2.  CSW was informed that referrals would have to come through [place] and "we have depleted our funds right now, if we take anyone in [place] will not pay [place]."   [place] #2 [phone]  CSW was asked to call [phone] on Monday and for [employer].  CSW notes that the patient's information has already been sent to [facility] for review.  [facility] [phone]  CSW was asked to call Tyler Deis at [phone].  CSW called and was asked to send information to [phone].  CSW was informed that she has openings for females at [facility] and group home and possible placement on males pending a placement.   [facility] [phone]  Phone rang then was hung up.   [place] [phone] CSW was asked to call Barrington Ellison [phone].  Another CSW has contacted Romie Minus about bed availability.   [facility] [phone]  CSW left HIPAA compliant voicemail.  [place]. [phone] Male only.  Fax FL2 to and asked to call at [phone].  Asked to send [place]  Assunta Curtis, MSW, LCSW 12/25/2018 2:54 PM   [facility] [phone]  CSW left HIPAA compliant voicemail.

## 2018-12-25 NOTE — BHH Counselor (Signed)
CSW contacted Ms. Randal Buba at [facility] [phone] who reports she has a male bed available. Ms. Randal Buba says she will need to discuss the pt case with her son before making a decision and to contact her after 1pm today.

## 2018-12-25 NOTE — Progress Notes (Signed)
Patient is down in the dayroom smiling and singing along with the music, having what appears to be a good time.

## 2018-12-25 NOTE — Progress Notes (Signed)
[id number] [facility] MD Progress Note  12/25/2018 2:43 PM Peter Parsons  MRN:  [id number] Subjective: Patient seen chart reviewed.  Patient is up out of bed and interacting more.  He still talks about being afraid that he is going to take his clothes off.  Obviously he still has not done anything like that.  Patient is able to hold a conversation reasonably well although he is slow.  He rocks back and forth constantly which I think may really be a side effect of the high dose of Invega that he got.  Not reporting any active suicidal thoughts.  Somewhat optimistic overall and especially about getting some kind of placement.  We have been told that he cannot go to the group home where he was placed so his guardian is needing to find something new. Principal Problem: Schizophrenia ([place]) Diagnosis: Principal Problem:   Schizophrenia ([place])  Total Time spent with patient: 30 minutes  Past Psychiatric History: Long history of chronic mental illness  Past Medical History:  Past Medical History:  Diagnosis Date  . Diabetes mellitus without complication ([place])   . Hypertension   . Schizophrenia ([place])    History reviewed. No pertinent surgical history. Family History: History reviewed. No pertinent family history. Family Psychiatric  History: See previous Social History:  Social History   Substance and Sexual Activity  Alcohol Use None     Social History   Substance and Sexual Activity  Drug Use Not on file    Social History   Socioeconomic History  . Marital status: Single    Spouse name: Not on file  . Number of children: Not on file  . Years of education: Not on file  . Highest education level: Not on file  Occupational History  . Not on file  Social Needs  . Financial resource strain: Not on file  . Food insecurity    Worry: Not on file    Inability: Not on file  . Transportation needs    Medical: Not on file    Non-medical: Not on file  Tobacco Use  . Smoking status: Never Smoker  .  Smokeless tobacco: Never Used  Substance and Sexual Activity  . Alcohol use: Not on file  . Drug use: Not on file  . Sexual activity: Not on file  Lifestyle  . Physical activity    Days per week: Not on file    Minutes per session: Not on file  . Stress: Not on file  Relationships  . Social Herbalist on phone: Not on file    Gets together: Not on file    Attends religious service: Not on file    Active member of club or organization: Not on file    Attends meetings of clubs or organizations: Not on file    Relationship status: Not on file  Other Topics Concern  . Not on file  Social History Narrative  . Not on file   Additional Social History:                         Sleep: Fair  Appetite:  Fair  Current Medications: Current Facility-Administered Medications  Medication Dose Route Frequency Provider Last Rate Last Dose  . acetaminophen (TYLENOL) tablet 650 mg  650 mg Oral Q6H PRN Cristofano, Dorene Ar, MD      . alum & mag hydroxide-simeth (MAALOX/MYLANTA) 200-200-20 MG/5ML suspension 30 mL  30 mL Oral Q4H PRN Cristofano, Dorene Ar, MD  30 mL at 12/20/18 1222  . benztropine (COGENTIN) tablet 0.5 mg  0.5 mg Oral BID , Madie Reno, MD   0.5 mg at 12/25/18 [id number]  . clonazePAM (KLONOPIN) tablet 0.5 mg  0.5 mg Oral BID , Madie Reno, MD   0.5 mg at 12/25/18 [id number]  . escitalopram (LEXAPRO) tablet 20 mg  20 mg Oral Daily , Madie Reno, MD   20 mg at 12/25/18 [id number]  . gabapentin (NEURONTIN) capsule 300 mg  300 mg Oral TID , Madie Reno, MD   300 mg at 12/25/18 1210  . lisinopril (ZESTRIL) tablet 20 mg  20 mg Oral Daily , Madie Reno, MD   20 mg at 12/25/18 [id number]  . loperamide (IMODIUM) capsule 2 mg  2 mg Oral PRN , Madie Reno, MD   2 mg at 12/20/18 2142  . LORazepam (ATIVAN) tablet 1 mg  1 mg Oral Q4H PRN Cristofano, Dorene Ar, MD   1 mg at 12/24/18 2201  . magnesium hydroxide (MILK OF MAGNESIA) suspension 30 mL  30 mL Oral Daily PRN Cristofano, Paul A, MD       . metFORMIN (GLUCOPHAGE) tablet 500 mg  500 mg Oral BID WC , Madie Reno, MD   500 mg at 12/25/18 [id number]  . nicotine (NICODERM CQ - dosed in mg/24 hours) patch 14 mg  14 mg Transdermal Daily , Madie Reno, MD   14 mg at 12/25/18 [id number]  . [START ON 01/14/2019] paliperidone (INVEGA SUSTENNA) injection 234 mg  234 mg Intramuscular Q28 days Money, Lowry Ram, FNP      . traZODone (DESYREL) tablet 100 mg  100 mg Oral QHS PRN Caroline Sauger, NP   100 mg at 12/24/18 2201    Lab Results: No results found for this or any previous visit (from the past 48 hour(s)).  Blood Alcohol level:  Lab Results  Component Value Date   ETH <10 [id number]    Metabolic Disorder Labs: No results found for: HGBA1C, MPG No results found for: PROLACTIN Lab Results  Component Value Date   CHOL 110 12/16/2018   TRIG 77 12/16/2018   HDL 40 (L) 12/16/2018   CHOLHDL 2.8 12/16/2018   VLDL 15 12/16/2018   LDLCALC 55 12/16/2018    Physical Findings: AIMS:  , ,  ,  ,    CIWA:    COWS:     Musculoskeletal: Strength & Muscle Tone: within normal limits Gait & Station: normal Patient leans: N/A  Psychiatric Specialty Exam: Physical Exam  Nursing note and vitals reviewed. Constitutional: He appears well-developed and well-nourished.  HENT:  Head: Normocephalic and atraumatic.  Eyes: Pupils are equal, round, and reactive to light. Conjunctivae are normal.  Neck: Normal range of motion.  Cardiovascular: Regular rhythm and normal heart sounds.  Respiratory: Effort normal. No respiratory distress.  GI: Soft.  Musculoskeletal: Normal range of motion.  Neurological: He is alert.  Skin: Skin is warm and dry.  Psychiatric: His mood appears anxious. His speech is delayed. He is slowed. Thought content is not paranoid. Cognition and memory are impaired. He expresses impulsivity. He expresses no homicidal and no suicidal ideation. He exhibits normal recent memory.    Review of Systems  Constitutional: Negative.    HENT: Negative.   Eyes: Negative.   Respiratory: Negative.   Cardiovascular: Negative.   Gastrointestinal: Negative.   Musculoskeletal: Negative.   Skin: Negative.   Neurological: Negative.   Psychiatric/Behavioral: Positive for depression. Negative for hallucinations, memory loss, substance abuse and suicidal ideas. The  patient is nervous/anxious. The patient does not have insomnia.     Blood pressure 110/75, pulse (!) 109, temperature 98.2 F (36.8 C), temperature source Oral, resp. rate 18, height 5\' 10"  (1.778 m), weight 90.3 kg, SpO2 97 %.Body mass index is 28.55 kg/m.  General Appearance: Casual  Eye Contact:  Good  Speech:  Clear and Coherent  Volume:  Normal  Mood:  Euthymic  Affect:  Constricted  Thought Process:  Disorganized  Orientation:  Full (Time, Place, and Person)  Thought Content:  Logical  Suicidal Thoughts:  No  Homicidal Thoughts:  No  Memory:  Immediate;   Fair Recent;   Fair Remote;   Fair  Judgement:  Impaired  Insight:  Shallow  Psychomotor Activity:  Restlessness  Concentration:  Concentration: Fair  Recall:  [employer] of Knowledge:  Fair  Language:  Fair  Akathisia:  Yes  Handed:  Right  AIMS (if indicated):     Assets:  Desire for Improvement Physical Health Resilience  ADL's:  Impaired  Cognition:  Impaired,  Mild  Sleep:  Number of Hours: 7.75     Treatment Plan Summary: Daily contact with patient to assess and evaluate symptoms and progress in treatment, Medication management and Plan Patient looks like he has a little akathisia.  Already on Cogentin and already a little tachycardic.  Continue current psychiatric medicine.  It is a little too early to try increasing the Lexapro again.  Once again encourage patient to focus on the fact that he has not acted on any of his obsessive thoughts.  Reassured him that we are working on finding placement within the next few days and encouraged his continued attendance at activities.  Alethia Berthold, MD 12/25/2018, 2:43 PM

## 2018-12-25 NOTE — Progress Notes (Signed)
Patient keeps coming to the nurses station asking for a sedative.

## 2018-12-25 NOTE — Progress Notes (Signed)
CSW spoke with Joaquim Lai of [facility] regarding potential placement for pt. Joaquim Lai requested information be faxed to [phone]. Fax was successful. Joaquim Lai reported she would follow up with CSW on Monday.  CSW spoke with Dan Maker who requested FL2 be faxed to [phone] and reported she is out of the office today so will not receive it until tomorrow. CSW will follow up on Monday.  CSW spoke with Janetta Hora who reported she does have a bed and requested information be sent to [phone]. CSW faxed information. Fax was successful.  CSW spoke with Sharl Ma who reported he will have a bed available at the beginning of December but reported his brother may have some beds available and would get CSW his brothers contact information.   CSW lvm for Ainsley Spinner regarding bed availability.  Evalina Field, MSW, LCSW Clinical Social Work 12/25/2018 10:49 AM

## 2018-12-25 NOTE — Plan of Care (Signed)
D- Patient alert and oriented. Patient presents in an anxious, but pleasant mood on assessment stating that he slept ok last night and had no major complaints to voice to this Probation officer. Patient endorsed AH, "telling me to take my clothes off". Patient also endorsed depression and anxiety, rating them both a "10/10", reporting that he's afraid that he's going to "strip off in front of women". Patient reported passive SI on his self-inventory, however, he did not endorse any of this to this Probation officer. Patient denies HI, VH, and pain at this time. Patient's goal for today is to "pray".  A- Scheduled medications administered to patient, per MD orders. Support and encouragement provided.  Routine safety checks conducted every 15 minutes.  Patient informed to notify staff with problems or concerns.  R- No adverse drug reactions noted. Patient contracts for safety at this time. Patient compliant with medications and treatment plan. Patient receptive, calm, and cooperative. Patient interacts well with others on the unit.  Patient remains safe at this time.  Problem: Education: Goal: Knowledge of [place] [facility] Education information/materials will improve Outcome: Progressing   Problem: Coping: Goal: Ability to verbalize frustrations and anger appropriately will improve Outcome: Progressing Goal: Ability to demonstrate self-control will improve Outcome: Progressing   Problem: Safety: Goal: Periods of time without injury will increase Outcome: Progressing

## 2018-12-25 NOTE — BHH Counselor (Signed)
CSW contacted the following group homes:  [facility] who states she has two beds available. Ms. Alda Lea says she would like to review documentation and then get back to CSW with a decision. [facility] beds Ms. Norma-left confidential vm, awaiting response Tammy Stanton-left confidential vm, awaiting response. Eilene Ghazi, [facility]-left confidential vm, awaiting response.

## 2018-12-25 NOTE — BHH Group Notes (Signed)
Feelings Around Relapse 12/25/2018 1PM  Type of Therapy and Topic:  Group Therapy:  Feelings around Relapse and Recovery  Participation Level:  Minimal   Description of Group:    Patients in this group will discuss emotions they experience before and after a relapse. They will process how experiencing these feelings, or avoidance of experiencing them, relates to having a relapse. Facilitator will guide patients to explore emotions they have related to recovery. Patients will be encouraged to process which emotions are more powerful. They will be guided to discuss the emotional reaction significant others in their lives may have to patients' relapse or recovery. Patients will be assisted in exploring ways to respond to the emotions of others without this contributing to a relapse.  Therapeutic Goals: 1. Patient will identify two or more emotions that lead to a relapse for them 2. Patient will identify two emotions that result when they relapse 3. Patient will identify two emotions related to recovery 4. Patient will demonstrate ability to communicate their needs through discussion and/or role plays   Summary of Patient Progress: Minimal input provided, however pt did complete assignment on relapse prevention plan. Pt sat quietly and respected group boundaries.    Therapeutic Modalities:   Cognitive Behavioral Therapy Solution-Focused Therapy Assertiveness Training Relapse Prevention Therapy   Yvette Rack, LCSW 12/25/2018 2:19 PM

## 2018-12-25 NOTE — BHH Counselor (Signed)
CSW contacted the following to determine if any beds are available:  [facility], [phone] No beds.  [facility], [phone] No beds available, but will soon.  Male beds only.  Has resources who have beds available, will have Adonis Huguenin call this CSW.    Alla Feeling, [phone] CSW was informed that he was on the other line and he will call CSW back.  Manon Hilding, [phone] CSW was asked to call Lucy [facility], [phone].   CSW called [facility] and was asked to fax the [place] to [phone].  Deborra Medina, [phone] CSW left HIPAA compliant voicemail.   Assunta Curtis, MSW, LCSW 12/25/2018 10:52 AM

## 2018-12-25 NOTE — BHH Counselor (Signed)
CSW left a confidential vm for Peter Parsons at [facility] group home [phone], awaiting response.

## 2018-12-25 NOTE — Progress Notes (Addendum)
Recreation Therapy Notes   Date: 12/25/2018  Time: 9:30 am  Location: Craft room  Behavioral response: Appropriate   Intervention Topic: Self-esteem   Discussion/Intervention:   Group content today was focused on self-esteem. Patient defined self-esteem and where it comes form. The group described reasons self-esteem is important. Individuals stated things that impact self-esteem and positive ways to improve self-esteem. The group participated in the intervention "Exploring self-esteem" where patients were able to create a collage of positive things that makes them who they are. Clinical Observations/Feedback:  Patient came to group and defined self-esteem as self-praise. He stated that emotions come from the heart. Participant expressed to the writer that he felt like he was going to take his clothes off so, he left group. Patient later returned to group and was able to complete the group intervention. Individual was social with peers and         Ernest Haber 12/25/2018 11:07 AM

## 2018-12-26 MED ORDER — GABAPENTIN 400 MG PO CAPS
400.0000 mg | ORAL_CAPSULE | Freq: Three times a day (TID) | ORAL | Status: DC
  Administered 2018-12-26 – 2018-12-28 (×7): 400 mg via ORAL
  Filled 2018-12-26 (×7): qty 1

## 2018-12-26 MED ORDER — ESCITALOPRAM OXALATE 10 MG PO TABS
30.0000 mg | ORAL_TABLET | Freq: Every day | ORAL | Status: DC
  Administered 2018-12-27 – 2019-01-05 (×10): 30 mg via ORAL
  Filled 2018-12-26 (×10): qty 3

## 2018-12-26 NOTE — Tx Team (Signed)
Interdisciplinary Treatment and Diagnostic Plan Update  12/26/2018 Time of Session: 9AM Peter Parsons MRN: [id number]  Principal Diagnosis: Schizophrenia [facility])  Secondary Diagnoses: Principal Problem:   Schizophrenia ([place])   Current Medications:  Current Facility-Administered Medications  Medication Dose Route Frequency Provider Last Rate Last Dose  . acetaminophen (TYLENOL) tablet 650 mg  650 mg Oral Q6H PRN Cristofano, Dorene Ar, MD      . alum & mag hydroxide-simeth (MAALOX/MYLANTA) 200-200-20 MG/5ML suspension 30 mL  30 mL Oral Q4H PRN Cristofano, Dorene Ar, MD   30 mL at 12/20/18 1222  . benztropine (COGENTIN) tablet 0.5 mg  0.5 mg Oral BID Clapacs, Madie Reno, MD   0.5 mg at 12/26/18 0836  . clonazePAM (KLONOPIN) tablet 0.5 mg  0.5 mg Oral BID Clapacs, Madie Reno, MD   0.5 mg at 12/26/18 0836  . escitalopram (LEXAPRO) tablet 20 mg  20 mg Oral Daily Clapacs, Madie Reno, MD   20 mg at 12/26/18 0836  . gabapentin (NEURONTIN) capsule 300 mg  300 mg Oral TID Clapacs, Madie Reno, MD   300 mg at 12/26/18 0836  . lisinopril (ZESTRIL) tablet 20 mg  20 mg Oral Daily Clapacs, Madie Reno, MD   20 mg at 12/26/18 0836  . loperamide (IMODIUM) capsule 2 mg  2 mg Oral PRN Clapacs, Madie Reno, MD   2 mg at 12/20/18 2142  . LORazepam (ATIVAN) tablet 1 mg  1 mg Oral Q4H PRN Cristofano, Dorene Ar, MD   1 mg at 12/25/18 2117  . magnesium hydroxide (MILK OF MAGNESIA) suspension 30 mL  30 mL Oral Daily PRN Cristofano, Paul A, MD      . metFORMIN (GLUCOPHAGE) tablet 500 mg  500 mg Oral BID WC Clapacs, John T, MD   500 mg at 12/26/18 0836  . nicotine (NICODERM CQ - dosed in mg/24 hours) patch 14 mg  14 mg Transdermal Daily Clapacs, Madie Reno, MD   14 mg at 12/26/18 0836  . [START ON 01/14/2019] paliperidone (INVEGA SUSTENNA) injection 234 mg  234 mg Intramuscular Q28 days Money, Lowry Ram, FNP      . traZODone (DESYREL) tablet 100 mg  100 mg Oral QHS PRN Caroline Sauger, NP   100 mg at 12/25/18 2117   PTA Medications: Medications Prior to  Admission  Medication Sig Dispense Refill Last Dose  . buPROPion (WELLBUTRIN XL) 150 MG 24 hr tablet Take 150 mg by mouth daily.   12/14/2018 at 0800  . gabapentin (NEURONTIN) 300 MG capsule Take 300 mg by mouth 3 (three) times daily.   12/14/2018 at 0800  . KLONOPIN 1 MG tablet Take 1 mg by mouth 3 (three) times daily.   12/14/2018 at 0800  . lisinopril-hydrochlorothiazide (ZESTORETIC) 20-25 MG tablet Take 1 tablet by mouth daily.   12/14/2018 at 0800  . metFORMIN (GLUCOPHAGE) 1000 MG tablet Take 1,000 mg by mouth 2 (two) times daily.   12/14/2018 at 0800  . paliperidone (INVEGA) 6 MG 24 hr tablet Take 6 mg by mouth daily.   12/14/2018 at 0800  . prazosin (MINIPRESS) 1 MG capsule Take 1 mg by mouth at bedtime.   Past Week at Unknown time  . risperiDONE microspheres (RISPERDAL CONSTA) 25 MG injection Inject 25 mg into the muscle every 14 (fourteen) days.   12/13/2018 at Unknown time  . simvastatin (ZOCOR) 20 MG tablet Take 20 mg by mouth daily.   12/14/2018 at 0800  . tamsulosin (FLOMAX) 0.4 MG CAPS capsule Take 0.4 mg by mouth daily.  12/14/2018 at 0800  . traZODone (DESYREL) 150 MG tablet Take 150 mg by mouth at bedtime as needed.   12/13/2018 at Unknown time  . ZYPREXA 15 MG tablet Take 15 mg by mouth at bedtime.   12/13/2018 at Unknown time    Patient Stressors: Financial difficulties Legal issue Medication change or noncompliance  Patient Strengths: Ability for insight Communication skills  Treatment Modalities: Medication Management, Group therapy, Case management,  1 to 1 session with clinician, Psychoeducation, Recreational therapy.   Physician Treatment Plan for Primary Diagnosis: Schizophrenia ([place]) Long Term Goal(s): Improvement in symptoms so as ready for discharge Improvement in symptoms so as ready for discharge   Short Term Goals: Ability to maintain clinical measurements within normal limits will improve Compliance with prescribed medications will improve Ability to disclose  and discuss suicidal ideas Ability to demonstrate self-control will improve  Medication Management: Evaluate patient's response, side effects, and tolerance of medication regimen.  Therapeutic Interventions: 1 to 1 sessions, Unit Group sessions and Medication administration.  Evaluation of Outcomes: Progressing  Physician Treatment Plan for Secondary Diagnosis: Principal Problem:   Schizophrenia ([place])  Long Term Goal(s): Improvement in symptoms so as ready for discharge Improvement in symptoms so as ready for discharge   Short Term Goals: Ability to maintain clinical measurements within normal limits will improve Compliance with prescribed medications will improve Ability to disclose and discuss suicidal ideas Ability to demonstrate self-control will improve     Medication Management: Evaluate patient's response, side effects, and tolerance of medication regimen.  Therapeutic Interventions: 1 to 1 sessions, Unit Group sessions and Medication administration.  Evaluation of Outcomes: Progressing   RN Treatment Plan for Primary Diagnosis: Schizophrenia ([place]) Long Term Goal(s): Knowledge of disease and therapeutic regimen to maintain health will improve  Short Term Goals: Ability to demonstrate self-control, Ability to participate in decision making will improve, Ability to verbalize feelings will improve and Ability to identify and develop effective coping behaviors will improve  Medication Management: RN will administer medications as ordered by provider, will assess and evaluate patient's response and provide education to patient for prescribed medication. RN will report any adverse and/or side effects to prescribing provider.  Therapeutic Interventions: 1 on 1 counseling sessions, Psychoeducation, Medication administration, Evaluate responses to treatment, Monitor vital signs and CBGs as ordered, Perform/monitor CIWA, COWS, AIMS and Fall Risk screenings as ordered, Perform wound  care treatments as ordered.  Evaluation of Outcomes: Progressing   LCSW Treatment Plan for Primary Diagnosis: Schizophrenia ([place]) Long Term Goal(s): Safe transition to appropriate next level of care at discharge, Engage patient in therapeutic group addressing interpersonal concerns.  Short Term Goals: Engage patient in aftercare planning with referrals and resources, Increase social support, Increase ability to appropriately verbalize feelings, Increase emotional regulation, Facilitate acceptance of mental health diagnosis and concerns and Increase skills for wellness and recovery  Therapeutic Interventions: Assess for all discharge needs, 1 to 1 time with Social worker, Explore available resources and support systems, Assess for adequacy in community support network, Educate family and significant other(s) on suicide prevention, Complete Psychosocial Assessment, Interpersonal group therapy.  Evaluation of Outcomes: Progressing   Progress in Treatment: Attending groups: Yes. Participating in groups: Yes. Taking medication as prescribed: Yes. Toleration medication: Yes. Family/Significant other contact made: Yes, individual(s) contacted:   completed with the patient's legal guardian.  Patient understands diagnosis: No. Discussing patient identified problems/goals with staff: Yes. Medical problems stabilized or resolved: Yes. Denies suicidal/homicidal ideation: Yes. Issues/concerns per patient self-inventory: No. Other: none  New problem(s) identified: No, Describe:  none  New Short Term/Long Term Goal(s): detox, elimination of symptoms of psychosis, medication management for mood stabilization; elimination of SI thoughts; development of comprehensive mental wellness/sobriety plan.  Patient Goals:  "I just want to get to a place where I can live in the group home successfully"  Discharge Plan or Barriers: Pt will follow up with his ACT team with [facility].  CSW is still assessing if  patient can return to the group home. UPDATE 12/22/2018:  CSW staff will schedule for the patient's group home to come visit with the patient to determine if patient will be allowed to return to the home.  Once scheduled for discharge CSW staff will reach out to [facility] ACT team to verify when they will follow up with the patient. Update 12/26/2018-Group home owner will not allow the pt to return to the group home due to her having concerns of him wandering off and being a threat to himself. Pt ACT Team and CSW team are working to find a new group home. Legal guardian and care coordinator have been informed of the placement issue.  Reason for Continuation of Hospitalization: Anxiety Depression [place] Medical Issues Suicidal ideation  Estimated Length of Stay: TBD  Recreational Therapy: Patient: N/A Patient Goal: Patient will engage in groups without prompting or encouragement from LRT x3 group sessions within 5 recreation therapy group sessions  Attendees: Patient:  12/26/2018 10:47 AM  Physician: Dr. Mallie Darting, MD 12/26/2018 10:47 AM  Nursing:  12/26/2018 10:47 AM  RN Care Manager: 12/26/2018 10:47 AM  Social Worker: Sanjuana Kava, LCSW 12/26/2018 10:47 AM  Recreational Therapist:  12/26/2018 10:47 AM  Other:  12/26/2018 10:47 AM  Other:  12/26/2018 10:47 AM  Other: 12/26/2018 10:47 AM    Scribe for Treatment Team: Yvette Rack, LCSW 12/26/2018 10:47 AM

## 2018-12-26 NOTE — Plan of Care (Signed)
Patient is calm with no physical concerns , stable and responding well to therapy regimen , denies any SI/HI/AVH and has not asked for any PRN , sleep is restful no distress.15 minutes safety check is maintained.   Problem: Education: Goal: Knowledge of [place] [facility] Education information/materials will improve Outcome: Progressing   Problem: Coping: Goal: Ability to verbalize frustrations and anger appropriately will improve Outcome: Progressing Goal: Ability to demonstrate self-control will improve Outcome: Progressing   Problem: Safety: Goal: Periods of time without injury will increase Outcome: Progressing

## 2018-12-26 NOTE — Plan of Care (Signed)
  Problem: Safety: Goal: Periods of time without injury will increase 12/26/2018 0618 by Harl Bowie, RN Outcome: Progressing 12/26/2018 0618 by Harl Bowie, RN Outcome: Progressing  Patent Is free of injury, denies SI.

## 2018-12-26 NOTE — Progress Notes (Signed)
Patient alert and oriented x 4 , interacting appropriately with peers and staff, no distress noted, he denies SI/HI/AVH, his thoughts are organized and coherent, he is complaint with medication regimen, 15 minutes safety checks maintained will continue to monitor.

## 2018-12-26 NOTE — Progress Notes (Signed)
[facility] MD Progress Note  12/26/2018 1:12 PM Peter Parsons  MRN:  [id number] Subjective: Patient is a 58 year old male with a past psychiatric history of schizophrenia who was admitted on 12/16/2018 who was brought to the hospital after running away from his group home on 2 occasions.  He stated at that time that he thought about killing himself and jumping off a bridge.  Objective: Patient is seen and examined.  Patient is a 58 year old male with the above-stated past psychiatric history who is seen in follow-up.  Review of the electronic medical record stated that he seemed to be getting out of bed more and interacting more.  He spoke to me about his fear about disrobing in front of females, and that is confirmed in the note from Dr. Weber Cooks from yesterday.  He does have semiautistic movements, and does shuffle in his gait.  Placement to a new group home is in process.  He denied any auditory or visual hallucinations.  He denied any suicidal or homicidal ideation.  His greatest fear was disrobing in front of females.  His current medications include Cogentin, Klonopin, Lexapro, gabapentin, lorazepam, Invega from an [place] long-acting injection 234 mg given on 12/15/2018.  He also receives trazodone at bedtime as needed.  His vital signs are stable, he is afebrile.  He did sleep 7.25 hours last night.  Review of his laboratories showed a mildly elevated glucose, and a mildly elevated creatinine at 1.29.  He is mildly anemic with a hemoglobin of 11 and hematocrit of 31.5.  TSH was normal at 3.592.  Drug screen was positive for benzodiazepines.  His EKG from 11/3 was essentially normal with a normal QTc interval.  Principal Problem: Schizophrenia ([place]) Diagnosis: Principal Problem:   Schizophrenia ([place])  Total Time spent with patient: 20 minutes  Past Psychiatric History: See admission H&P  Past Medical History:  Past Medical History:  Diagnosis Date  . Diabetes mellitus without complication ([place])   .  Hypertension   . Schizophrenia ([place])    History reviewed. No pertinent surgical history. Family History: History reviewed. No pertinent family history. Family Psychiatric  History: See admission H&P Social History:  Social History   Substance and Sexual Activity  Alcohol Use None     Social History   Substance and Sexual Activity  Drug Use Not on file    Social History   Socioeconomic History  . Marital status: Single    Spouse name: Not on file  . Number of children: Not on file  . Years of education: Not on file  . Highest education level: Not on file  Occupational History  . Not on file  Social Needs  . Financial resource strain: Not on file  . Food insecurity    Worry: Not on file    Inability: Not on file  . Transportation needs    Medical: Not on file    Non-medical: Not on file  Tobacco Use  . Smoking status: Never Smoker  . Smokeless tobacco: Never Used  Substance and Sexual Activity  . Alcohol use: Not on file  . Drug use: Not on file  . Sexual activity: Not on file  Lifestyle  . Physical activity    Days per week: Not on file    Minutes per session: Not on file  . Stress: Not on file  Relationships  . Social Herbalist on phone: Not on file    Gets together: Not on file    Attends religious  service: Not on file    Active member of club or organization: Not on file    Attends meetings of clubs or organizations: Not on file    Relationship status: Not on file  Other Topics Concern  . Not on file  Social History Narrative  . Not on file   Additional Social History:                         Sleep: Good  Appetite:  Fair  Current Medications: Current Facility-Administered Medications  Medication Dose Route Frequency Provider Last Rate Last Dose  . acetaminophen (TYLENOL) tablet 650 mg  650 mg Oral Q6H PRN Cristofano, Dorene Ar, MD      . alum & mag hydroxide-simeth (MAALOX/MYLANTA) 200-200-20 MG/5ML suspension 30 mL  30 mL Oral  Q4H PRN Cristofano, Dorene Ar, MD   30 mL at 12/20/18 1222  . benztropine (COGENTIN) tablet 0.5 mg  0.5 mg Oral BID Clapacs, Madie Reno, MD   0.5 mg at 12/26/18 0836  . clonazePAM (KLONOPIN) tablet 0.5 mg  0.5 mg Oral BID Clapacs, Madie Reno, MD   0.5 mg at 12/26/18 0836  . escitalopram (LEXAPRO) tablet 20 mg  20 mg Oral Daily Clapacs, Madie Reno, MD   20 mg at 12/26/18 0836  . gabapentin (NEURONTIN) capsule 300 mg  300 mg Oral TID Clapacs, John T, MD   300 mg at 12/26/18 1300  . lisinopril (ZESTRIL) tablet 20 mg  20 mg Oral Daily Clapacs, Madie Reno, MD   20 mg at 12/26/18 0836  . loperamide (IMODIUM) capsule 2 mg  2 mg Oral PRN Clapacs, Madie Reno, MD   2 mg at 12/20/18 2142  . LORazepam (ATIVAN) tablet 1 mg  1 mg Oral Q4H PRN Cristofano, Dorene Ar, MD   1 mg at 12/25/18 2117  . magnesium hydroxide (MILK OF MAGNESIA) suspension 30 mL  30 mL Oral Daily PRN Cristofano, Paul A, MD      . metFORMIN (GLUCOPHAGE) tablet 500 mg  500 mg Oral BID WC Clapacs, John T, MD   500 mg at 12/26/18 0836  . nicotine (NICODERM CQ - dosed in mg/24 hours) patch 14 mg  14 mg Transdermal Daily Clapacs, Madie Reno, MD   14 mg at 12/26/18 0836  . [START ON 01/14/2019] paliperidone (INVEGA SUSTENNA) injection 234 mg  234 mg Intramuscular Q28 days Money, Lowry Ram, FNP      . traZODone (DESYREL) tablet 100 mg  100 mg Oral QHS PRN Caroline Sauger, NP   100 mg at 12/25/18 2117    Lab Results: No results found for this or any previous visit (from the past 48 hour(s)).  Blood Alcohol level:  Lab Results  Component Value Date   ETH <10 99991111    Metabolic Disorder Labs: No results found for: HGBA1C, MPG No results found for: PROLACTIN Lab Results  Component Value Date   CHOL 110 12/16/2018   TRIG 77 12/16/2018   HDL 40 (L) 12/16/2018   CHOLHDL 2.8 12/16/2018   VLDL 15 12/16/2018   LDLCALC 55 12/16/2018    Physical Findings: AIMS:  , ,  ,  ,    CIWA:    COWS:     Musculoskeletal: Strength & Muscle Tone: within normal  limits Gait & Station: shuffle Patient leans: N/A  Psychiatric Specialty Exam: Physical Exam  Nursing note and vitals reviewed. Constitutional: He is oriented to person, place, and time. He appears well-developed and well-nourished.  HENT:  Head: Normocephalic and atraumatic.  Respiratory: Effort normal and breath sounds normal.  Neurological: He is alert and oriented to person, place, and time.    ROS  Blood pressure 128/77, pulse 91, temperature 98 F (36.7 C), temperature source Oral, resp. rate 17, height 5\' 10"  (1.778 m), weight 90.3 kg, SpO2 99 %.Body mass index is 28.55 kg/m.  General Appearance: Casual  Eye Contact:  Fair  Speech:  Pressured  Volume:  Decreased  Mood:  Anxious  Affect:  Congruent  Thought Process:  Coherent and Descriptions of Associations: Intact  Orientation:  Full (Time, Place, and Person)  Thought Content:  Delusions, Obsessions and Rumination  Suicidal Thoughts:  No  Homicidal Thoughts:  No  Memory:  Immediate;   Fair Recent;   Fair Remote;   Fair  Judgement:  Intact  Insight:  Lacking  Psychomotor Activity:  Increased  Concentration:  Concentration: Fair and Attention Span: Fair  Recall:  [employer] of Knowledge:  Fair  Language:  Fair  Akathisia:  Yes  Handed:  Right  AIMS (if indicated):     Assets:  Desire for Improvement Resilience  ADL's:  Impaired  Cognition:  WNL  Sleep:  Number of Hours: 7.25     Treatment Plan Summary: Daily contact with patient to assess and evaluate symptoms and progress in treatment, Medication management and Plan : Patient is seen and examined.  Patient is a 58 year old male with the above-stated past psychiatric history who is seen in follow-up.   Diagnosis: #1 schizophrenia, #2 hypertension, #3 akathisia, #4 diabetes mellitus type 2   Patient is seen in follow-up.  He still purely focused on his fears about disrobing in front of females.  It sounds almost obsessional.  He is on Lexapro 20 mg p.o.  daily, and that was increased on 11/11.  I am going to increase that to 30 mg p.o. daily to see if that will take the edge off.  Additionally I will increase his gabapentin to 400 mg p.o. 3 times daily, and see if that is effective in decreasing some of his agitation.  No other changes in his medications at this point. 1.  Continue Cogentin 0.5 mg p.o. twice daily for akathisia. 2.  Continue clonazepam 0.5 mg p.o. twice daily for anxiety. 3.  Increase Lexapro to 30 mg p.o. daily for depression and anxiety. 4.  Increase Neurontin to 400 mg p.o. 3 times daily for anxiety. 5.  Continue lisinopril 20 mg p.o. daily for hypertension. 6.  Continue lorazepam 1 mg p.o. every 4 hours as needed anxiety. 7.  Continue Metformin 500 mg p.o. twice daily with food for diabetes mellitus type 2 8.  Patient received the long-acting paliperidone injection 234 mg on 12/15/2018.  This is for psychosis. 9.  Continue trazodone 100 mg p.o. nightly as needed insomnia. 10.  Disposition planning-in progress.  Sharma Covert, MD 12/26/2018, 1:12 PM

## 2018-12-26 NOTE — BHH Group Notes (Signed)
[facility] Group Notes:  (Nursing/MHT/Case Management/Adjunct)  Date:  12/26/2018  Time:  9:04 PM  Type of Therapy:  Group Therapy  Participation Level:  Active  Participation Quality:  Supportive  Affect:  Appropriate  Cognitive:  Alert  Insight:  Good  Engagement in Group:  Engaged  Modes of Intervention:  Support and He said he had a difficult day  Summary of Progress/Problems:  Nehemiah Settle 12/26/2018, 9:04 PM

## 2018-12-26 NOTE — Plan of Care (Signed)
Patient denies SI/HI/AVH. Patient is isolative to room for most of the day. Patient is minimal with assessment. Patient is slow and shuffling when ambulating in the halls. Patient's safety is maintained on the unit.    Problem: Education: Goal: Knowledge of [place] [facility] Education information/materials will improve Outcome: Not Progressing   Problem: Coping: Goal: Ability to verbalize frustrations and anger appropriately will improve Outcome: Not Progressing Goal: Ability to demonstrate self-control will improve Outcome: Not Progressing

## 2018-12-27 LAB — RETICULOCYTES
Immature Retic Fract: 7.7 % (ref 2.3–15.9)
RBC.: 3.64 MIL/uL — ABNORMAL LOW (ref 4.22–5.81)
Retic Count, Absolute: 50.2 10*3/uL (ref 19.0–186.0)
Retic Ct Pct: 1.4 % (ref 0.4–3.1)

## 2018-12-27 LAB — FERRITIN: Ferritin: 140 ng/mL (ref 24–336)

## 2018-12-27 LAB — IRON AND TIBC
Iron: 36 ug/dL — ABNORMAL LOW (ref 45–182)
Saturation Ratios: 16 % — ABNORMAL LOW (ref 17.9–39.5)
TIBC: 225 ug/dL — ABNORMAL LOW (ref 250–450)
UIBC: 189 ug/dL

## 2018-12-27 LAB — VITAMIN B12: Vitamin B-12: 149 pg/mL — ABNORMAL LOW (ref 180–914)

## 2018-12-27 LAB — FOLATE: Folate: 11.1 ng/mL (ref >5.9)

## 2018-12-27 NOTE — Plan of Care (Signed)
D- Patient alert and oriented. Patient presents in an anxious, but pleasant mood on assessment stating that he slept "good" last night and had no complaints to voice to this writer at this time. Patient continues to endorse passive SI, without a plan, because "I want the psychological pain to end". Patient also endorses AH telling him to "strip down naked in front of women". Patient rates his depression an "8/10" and his anxiety a "10/10". Patient reports that "the fact that I'm thinking about doing something wrong" and "the thought of stripping of before women, which I don't normally feel like". Patient denies HI, VH, and pain at this time. Patient's goal for today is "being accepted in family home".  A- Scheduled medications administered to patient, per MD orders. Support and encouragement provided.  Routine safety checks conducted every 15 minutes.  Patient informed to notify staff with problems or concerns.  R- No adverse drug reactions noted. Patient contracts for safety at this time. Patient compliant with medications and treatment plan. Patient receptive, calm, and cooperative. Patient interacts well with others on the unit.  Patient remains safe at this time.  Problem: Education: Goal: Knowledge of [place] [facility] Education information/materials will improve Outcome: Progressing   Problem: Coping: Goal: Ability to verbalize frustrations and anger appropriately will improve Outcome: Progressing Goal: Ability to demonstrate self-control will improve Outcome: Progressing   Problem: Safety: Goal: Periods of time without injury will increase Outcome: Progressing

## 2018-12-27 NOTE — B[facility] Group Notes (Signed)
LCSW Group Therapy Note 12/27/2018 1:15pm  Type of Therapy and Topic: Group Therapy: Feelings Around Returning Home & Establishing a Supportive Framework and Supporting Oneself When Supports Not Available  Participation Level: Did Not Attend  Description of Group:  Patients first processed thoughts and feelings about upcoming discharge. These included fears of upcoming changes, lack of change, new living environments, judgements and expectations from others and overall stigma of mental health issues. The group then discussed the definition of a supportive framework, what that looks and feels like, and how do to discern it from an unhealthy non-supportive network. The group identified different types of supports as well as what to do when your family/friends are less than helpful or unavailable  Therapeutic Goals  1. Patient will identify one healthy supportive network that they can use at discharge. 2. Patient will identify one factor of a supportive framework and how to tell it from an unhealthy network. 3. Patient able to identify one coping skill to use when they do not have positive supports from others. 4. Patient will demonstrate ability to communicate their needs through discussion and/or role plays.  Summary of Patient Progress:  Pt was invited to attend group but chose not to attend. CSW will continue to encourage pt to attend group throughout their admission.   Therapeutic Modalities Cognitive Behavioral Therapy Motivational Interviewing   Cierra Rothgeb  CUEBAS-COLON, LCSW 12/27/2018 12:25 PM

## 2018-12-27 NOTE — Progress Notes (Signed)
[facility] MD Progress Note  12/27/2018 10:43 AM Peter Parsons  MRN:  [id number] Subjective:  Patient is a 58 year old male with a past psychiatric history of schizophrenia who was admitted on 12/16/2018 who was brought to the hospital after running away from his group home on 2 occasions.  He stated at that time that he thought about killing himself and jumping off a bridge.  Objective: Patient is seen and examined.  Patient is a 58 year old male with the above-stated past psychiatric history who is seen in follow-up.  He is essentially unchanged from yesterday.  He stated he still concerned about taking his clothes off in front of women.  We explored that a bit.  He stated that he did not have those feelings 3 days ago.  He is unable to tell me clearly what those feelings have been in the past.  He denied any auditory or visual hallucinations.  He denied any suicidal or homicidal ideation.  I increased his Lexapro and Neurontin yesterday in an attempt to control the anxiety that may be provoking some of his fears of disrobing.  They have not had any benefit so far.  He slept 8.25 hours last night, his vital signs are stable he is afebrile.  No new laboratories.  His blood sugar this morning is 133.  Principal Problem: Schizophrenia ([place]) Diagnosis: Principal Problem:   Schizophrenia ([place])  Total Time spent with patient: 15 minutes  Past Psychiatric History: See admission H&P  Past Medical History:  Past Medical History:  Diagnosis Date  . Diabetes mellitus without complication ([place])   . Hypertension   . Schizophrenia ([place])    History reviewed. No pertinent surgical history. Family History: History reviewed. No pertinent family history. Family Psychiatric  History: See admission H&P Social History:  Social History   Substance and Sexual Activity  Alcohol Use None     Social History   Substance and Sexual Activity  Drug Use Not on file    Social History   Socioeconomic History  .  Marital status: Single    Spouse name: Not on file  . Number of children: Not on file  . Years of education: Not on file  . Highest education level: Not on file  Occupational History  . Not on file  Social Needs  . Financial resource strain: Not on file  . Food insecurity    Worry: Not on file    Inability: Not on file  . Transportation needs    Medical: Not on file    Non-medical: Not on file  Tobacco Use  . Smoking status: Never Smoker  . Smokeless tobacco: Never Used  Substance and Sexual Activity  . Alcohol use: Not on file  . Drug use: Not on file  . Sexual activity: Not on file  Lifestyle  . Physical activity    Days per week: Not on file    Minutes per session: Not on file  . Stress: Not on file  Relationships  . Social Herbalist on phone: Not on file    Gets together: Not on file    Attends religious service: Not on file    Active member of club or organization: Not on file    Attends meetings of clubs or organizations: Not on file    Relationship status: Not on file  Other Topics Concern  . Not on file  Social History Narrative  . Not on file   Additional Social History:  Sleep: Good  Appetite:  Fair  Current Medications: Current Facility-Administered Medications  Medication Dose Route Frequency Provider Last Rate Last Dose  . acetaminophen (TYLENOL) tablet 650 mg  650 mg Oral Q6H PRN Cristofano, Dorene Ar, MD      . alum & mag hydroxide-simeth (MAALOX/MYLANTA) 200-200-20 MG/5ML suspension 30 mL  30 mL Oral Q4H PRN Cristofano, Dorene Ar, MD   30 mL at 12/20/18 1222  . benztropine (COGENTIN) tablet 0.5 mg  0.5 mg Oral BID Clapacs, Madie Reno, MD   0.5 mg at 12/27/18 0819  . clonazePAM (KLONOPIN) tablet 0.5 mg  0.5 mg Oral BID Clapacs, Madie Reno, MD   0.5 mg at 12/27/18 0820  . escitalopram (LEXAPRO) tablet 30 mg  30 mg Oral Daily Sharma Covert, MD   30 mg at 12/27/18 0820  . gabapentin (NEURONTIN) capsule 400 mg  400  mg Oral TID Sharma Covert, MD   400 mg at 12/27/18 0820  . lisinopril (ZESTRIL) tablet 20 mg  20 mg Oral Daily Clapacs, Madie Reno, MD   20 mg at 12/27/18 0820  . loperamide (IMODIUM) capsule 2 mg  2 mg Oral PRN Clapacs, Madie Reno, MD   2 mg at 12/20/18 2142  . LORazepam (ATIVAN) tablet 1 mg  1 mg Oral Q4H PRN Cristofano, Dorene Ar, MD   1 mg at 12/26/18 2109  . magnesium hydroxide (MILK OF MAGNESIA) suspension 30 mL  30 mL Oral Daily PRN Cristofano, Paul A, MD      . metFORMIN (GLUCOPHAGE) tablet 500 mg  500 mg Oral BID WC Clapacs, Madie Reno, MD   500 mg at 12/27/18 0820  . nicotine (NICODERM CQ - dosed in mg/24 hours) patch 14 mg  14 mg Transdermal Daily Clapacs, Madie Reno, MD   14 mg at 12/27/18 [id number]  . [START ON 01/14/2019] paliperidone (INVEGA SUSTENNA) injection 234 mg  234 mg Intramuscular Q28 days Money, Lowry Ram, FNP      . traZODone (DESYREL) tablet 100 mg  100 mg Oral QHS PRN Caroline Sauger, NP   100 mg at 12/26/18 2108    Lab Results: No results found for this or any previous visit (from the past 48 hour(s)).  Blood Alcohol level:  Lab Results  Component Value Date   ETH <10 99991111    Metabolic Disorder Labs: No results found for: HGBA1C, MPG No results found for: PROLACTIN Lab Results  Component Value Date   CHOL 110 12/16/2018   TRIG 77 12/16/2018   HDL 40 (L) 12/16/2018   CHOLHDL 2.8 12/16/2018   VLDL 15 12/16/2018   LDLCALC 55 12/16/2018    Physical Findings: AIMS:  , ,  ,  ,    CIWA:    COWS:     Musculoskeletal: Strength & Muscle Tone: within normal limits Gait & Station: shuffle Patient leans: N/A  Psychiatric Specialty Exam: Physical Exam  Nursing note and vitals reviewed. Constitutional: He is oriented to person, place, and time. He appears well-developed and well-nourished.  HENT:  Head: Normocephalic and atraumatic.  Respiratory: Effort normal.  Neurological: He is alert and oriented to person, place, and time.    ROS  Blood pressure 128/80,  pulse 100, temperature 98.3 F (36.8 C), temperature source Oral, resp. rate 18, height 5\' 10"  (1.778 m), weight 90.3 kg, SpO2 95 %.Body mass index is 28.55 kg/m.  General Appearance: Disheveled  Eye Contact:  Fair  Speech:  Normal Rate  Volume:  Decreased  Mood:  Anxious  Affect:  Flat  Thought Process:  Goal Directed and Descriptions of Associations: Circumstantial  Orientation:  Full (Time, Place, and Person)  Thought Content:  Delusions, Obsessions and Rumination  Suicidal Thoughts:  No  Homicidal Thoughts:  No  Memory:  Immediate;   Fair Recent;   Fair Remote;   Fair  Judgement:  Intact  Insight:  Fair  Psychomotor Activity:  Decreased  Concentration:  Concentration: Fair and Attention Span: Fair  Recall:  [employer] of Knowledge:  Fair  Language:  Fair  Akathisia:  Negative  Handed:  Right  AIMS (if indicated):     Assets:  Desire for Improvement Resilience  ADL's:  Intact  Cognition:  WNL  Sleep:  Number of Hours: 8.25     Treatment Plan Summary: Daily contact with patient to assess and evaluate symptoms and progress in treatment, Medication management and Plan : Patient is seen and examined.  Patient is a 58 year old male with the above-stated past psychiatric history who seen in follow-up.  Diagnosis: #1 schizophrenia, #2 hypertension, #3 akathisia, #4 diabetes mellitus type 2  Patient is seen in follow-up he is essentially unchanged from yesterday.  His gabapentin and Lexapro were increased in an attempt to control his anxiety and his fears about exposing himself.  His sleep is adequate.  His blood sugar is well controlled.  He is mildly anemic with a hemoglobin of 11.1 and hematocrit of 31.5.  I will order iron studies on him for tomorrow.  Additionally 1 might consider Lupron in case any of his feelings of disrobing in front of women are generated because of testosterone, and if he agreed to this was essentially eliminate his sex drive.  No other changes in his  medications today.  1.  Continue Cogentin 0.5 mg p.o. twice daily for akathisia. 2.  Continue clonazepam 0.5 mg p.o. twice daily for anxiety. 3.    Continue Lexapro to 30 mg p.o. daily for depression and anxiety. 4.    Continue Neurontin to 400 mg p.o. 3 times daily for anxiety. 5.  Continue lisinopril 20 mg p.o. daily for hypertension. 6.  Continue lorazepam 1 mg p.o. every 4 hours as needed anxiety. 7.  Continue Metformin 500 mg p.o. twice daily with food for diabetes mellitus type 2 8.  Patient received the long-acting paliperidone injection 234 mg on 12/15/2018.  This is for psychosis. 9.  Continue trazodone 100 mg p.o. nightly as needed insomnia. 10.    Get iron studies to assess his anemia. 11.  Consideration of addition of Lupron if his disrobing behaviors are generated by hypersexual thoughts. 12.  Disposition planning-in progress.  Sharma Covert, MD 12/27/2018, 10:43 AM

## 2018-12-28 MED ORDER — TUBERCULIN PPD 5 UNIT/0.1ML ID SOLN
5.0000 [IU] | Freq: Once | INTRADERMAL | Status: AC
  Administered 2018-12-28: 5 [IU] via INTRADERMAL
  Filled 2018-12-28: qty 0.1

## 2018-12-28 MED ORDER — GABAPENTIN 300 MG PO CAPS
300.0000 mg | ORAL_CAPSULE | Freq: Three times a day (TID) | ORAL | Status: DC
  Administered 2018-12-29 – 2019-01-05 (×24): 300 mg via ORAL
  Filled 2018-12-28 (×24): qty 1

## 2018-12-28 NOTE — BHH Counselor (Signed)
CSW called the following to follow up on bed placement:  Allene Pyo, [phone] QP has not come by the home yet to review. Joaquim Lai will call tomorrow.    Dan Maker, [phone] She asked that information be re-faxed to [phone].  CSW received confirmation that fax was successful.  [facility] [phone], Janetta Hora [phone]  CSW called and was informed that Marcelline Mates was on the phone.  CSW left HIPAA compliant message requesting a return phone call.    [facility] [phone], Tyler Deis at [phone] CSW called and left HIPAA compliant voicemail.    [place] [phone] CSW called and left HIPAA compliant voicemail.   Assunta Curtis, MSW, LCSW 12/28/2018 1:27 PM

## 2018-12-28 NOTE — [facility] Group Notes (Signed)
LCSW Group Therapy Note   12/28/2018 10:55 AM   Type of Therapy and Topic:  Group Therapy:  Overcoming Obstacles   Participation Level:  Did Not Attend   Description of Group:    In this group patients will be encouraged to explore what they see as obstacles to their own wellness and recovery. They will be guided to discuss their thoughts, feelings, and behaviors related to these obstacles. The group will process together ways to cope with barriers, with attention given to specific choices patients can make. Each patient will be challenged to identify changes they are motivated to make in order to overcome their obstacles. This group will be process-oriented, with patients participating in exploration of their own experiences as well as giving and receiving support and challenge from other group members.   Therapeutic Goals: 1. Patient will identify personal and current obstacles as they relate to admission. 2. Patient will identify barriers that currently interfere with their wellness or overcoming obstacles.  3. Patient will identify feelings, thought process and behaviors related to these barriers. 4. Patient will identify two changes they are willing to make to overcome these obstacles:      Summary of Patient Progress x     Therapeutic Modalities:   Cognitive Behavioral Therapy Solution Focused Therapy Motivational Interviewing Relapse Prevention Therapy  Evalina Field, MSW, LCSW Clinical Social Work 12/28/2018 10:55 AM

## 2018-12-28 NOTE — Plan of Care (Signed)
D- Patient alert and oriented. Patient presents in a depressed, but pleasant mood on assessment stating that he slept "very well" last night and had no complaints to voice to this Probation officer. Patient endorsed passive SI reporting that "I'm a terrible person because I don't fit in". Patient also endorsed depression and anxiety, rating them a "7/10" and "8/10". Patient stated that "I feel like people are out to get me". Patient denies HI, AVH, and pain at this time. Patient's goal for today is to "pray".  A- Scheduled medications administered to patient, per MD orders. Support and encouragement provided.  Routine safety checks conducted every 15 minutes.  Patient informed to notify staff with problems or concerns.  R- No adverse drug reactions noted. Patient contracts for safety at this time. Patient compliant with medications and treatment plan. Patient receptive, calm, and cooperative. Patient interacts well with others on the unit.  Patient remains safe at this time.  Problem: Education: Goal: Knowledge of [facility] Education information/materials will improve Outcome: Progressing   Problem: Coping: Goal: Ability to verbalize frustrations and anger appropriately will improve Outcome: Progressing Goal: Ability to demonstrate self-control will improve Outcome: Progressing   Problem: Safety: Goal: Periods of time without injury will increase Outcome: Progressing

## 2018-12-28 NOTE — BHH Counselor (Signed)
CSW spoke with  Halford Chessman at [phone] about the patient's possible placement.  CSW was informed that they were "skeptical" to take patient due to history of wandering off.    CSW spoke with Cecilie Lowers at [facility].  He reports that they have not called placements at this time, though they plan on calling this afternoon.  He reports that he plans on calling Barbaraann Barthel son at [phone] or [phone] and Jari Pigg at [phone].Marland Kitchen  He reports that he will follow up once he has called.  Assunta Curtis, MSW, LCSW 12/28/2018 2:03 PM

## 2018-12-28 NOTE — BHH Counselor (Signed)
CSW received phone call from Josepha Pigg at [facility] group home, stating she has a male bed available. [name] request FL2 and assessment for review and states she will follow up with CSW.

## 2018-12-28 NOTE — Progress Notes (Signed)
Recreation Therapy Notes  Date: 12/28/2018  Time: 9:30 am   Location: Craft room   Behavioral response: N/A   Intervention Topic: Goals  Discussion/Intervention: Patient did not attend group.   Clinical Observations/Feedback:  Patient did not attend group.   Bassem Bernasconi LRT/CTRS        Gaege Sangalang 12/28/2018 11:10 AM

## 2018-12-28 NOTE — Progress Notes (Signed)
[facility] MD Progress Note  12/28/2018 4:57 PM Dereck Neenan  MRN:  [id number] Subjective: Follow-up for this gentleman with schizoaffective disorder and obsessive anxiety.  Patient says he feels tired much of the time during the day.  He is disheveled today but he says his mood is better.  He denies suicidal thoughts.  Denies any homicidal thoughts says the intrusive thoughts are less disturbing. Principal Problem: Schizophrenia ([place]) Diagnosis: Principal Problem:   Schizophrenia ([place])  Total Time spent with patient: 30 minutes  Past Psychiatric History: Patient has a past history of schizoaffective disorder  Past Medical History:  Past Medical History:  Diagnosis Date  . Diabetes mellitus without complication ([place])   . Hypertension   . Schizophrenia ([place])    History reviewed. No pertinent surgical history. Family History: History reviewed. No pertinent family history. Family Psychiatric  History: See previous Social History:  Social History   Substance and Sexual Activity  Alcohol Use None     Social History   Substance and Sexual Activity  Drug Use Not on file    Social History   Socioeconomic History  . Marital status: Single    Spouse name: Not on file  . Number of children: Not on file  . Years of education: Not on file  . Highest education level: Not on file  Occupational History  . Not on file  Social Needs  . Financial resource strain: Not on file  . Food insecurity    Worry: Not on file    Inability: Not on file  . Transportation needs    Medical: Not on file    Non-medical: Not on file  Tobacco Use  . Smoking status: Never Smoker  . Smokeless tobacco: Never Used  Substance and Sexual Activity  . Alcohol use: Not on file  . Drug use: Not on file  . Sexual activity: Not on file  Lifestyle  . Physical activity    Days per week: Not on file    Minutes per session: Not on file  . Stress: Not on file  Relationships  . Social Herbalist on  phone: Not on file    Gets together: Not on file    Attends religious service: Not on file    Active member of club or organization: Not on file    Attends meetings of clubs or organizations: Not on file    Relationship status: Not on file  Other Topics Concern  . Not on file  Social History Narrative  . Not on file   Additional Social History:                         Sleep: Fair  Appetite:  Fair  Current Medications: Current Facility-Administered Medications  Medication Dose Route Frequency Provider Last Rate Last Dose  . acetaminophen (TYLENOL) tablet 650 mg  650 mg Oral Q6H PRN Cristofano, Dorene Ar, MD      . alum & mag hydroxide-simeth (MAALOX/MYLANTA) 200-200-20 MG/5ML suspension 30 mL  30 mL Oral Q4H PRN Cristofano, Dorene Ar, MD   30 mL at 12/20/18 1222  . benztropine (COGENTIN) tablet 0.5 mg  0.5 mg Oral BID Sutter Ahlgren T, MD   0.5 mg at 12/28/18 1603  . clonazePAM (KLONOPIN) tablet 0.5 mg  0.5 mg Oral BID Emary Zalar, Madie Reno, MD   0.5 mg at 12/28/18 1603  . escitalopram (LEXAPRO) tablet 30 mg  30 mg Oral Daily Clary, Cordie Grice, MD  30 mg at 12/28/18 [id number]  . gabapentin (NEURONTIN) capsule 400 mg  400 mg Oral TID Sharma Covert, MD   400 mg at 12/28/18 1603  . lisinopril (ZESTRIL) tablet 20 mg  20 mg Oral Daily Tobi Leinweber, Madie Reno, MD   20 mg at 12/28/18 [id number]  . loperamide (IMODIUM) capsule 2 mg  2 mg Oral PRN Quartez Lagos, Madie Reno, MD   2 mg at 12/20/18 2142  . LORazepam (ATIVAN) tablet 1 mg  1 mg Oral Q4H PRN Cristofano, Dorene Ar, MD   1 mg at 12/26/18 2109  . magnesium hydroxide (MILK OF MAGNESIA) suspension 30 mL  30 mL Oral Daily PRN Cristofano, Paul A, MD      . metFORMIN (GLUCOPHAGE) tablet 500 mg  500 mg Oral BID WC Jarvin Ogren, Madie Reno, MD   500 mg at 12/28/18 1603  . nicotine (NICODERM CQ - dosed in mg/24 hours) patch 14 mg  14 mg Transdermal Daily Shion Bluestein, Madie Reno, MD   14 mg at 12/28/18 0813  . [START ON 01/14/2019] paliperidone (INVEGA SUSTENNA) injection 234 mg  234 mg  Intramuscular Q28 days Money, Lowry Ram, FNP      . traZODone (DESYREL) tablet 100 mg  100 mg Oral QHS PRN Caroline Sauger, NP   100 mg at 12/27/18 2118  . tuberculin injection 5 Units  5 Units Intradermal Once Seferino Oscar, Madie Reno, MD   5 Units at 12/28/18 1230    Lab Results:  Results for orders placed or performed during the hospital encounter of 12/15/18 (from the past 48 hour(s))  Vitamin B12     Status: Abnormal   Collection Time: 12/27/18 11:47 AM  Result Value Ref Range   Vitamin B-12 149 (L) 180 - 914 pg/mL    Comment: (NOTE) This assay is not validated for testing neonatal or myeloproliferative syndrome specimens for Vitamin B12 levels. Performed at [facility], [place] [address], [place], [place] [address]   Folate     Status: None   Collection Time: 12/27/18 11:47 AM  Result Value Ref Range   Folate 11.1 >5.9 ng/mL    Comment: Performed at [facility], [address] [place], [place] [address]  Iron and TIBC     Status: Abnormal   Collection Time: 12/27/18 11:47 AM  Result Value Ref Range   Iron 36 (L) 45 - 182 ug/dL   TIBC 225 (L) 250 - 450 ug/dL   Saturation Ratios 16 (L) 17.9 - 39.5 %   UIBC 189 ug/dL    Comment: Performed at [facility], [address] [place], [place] [address]  Ferritin     Status: None   Collection Time: 12/27/18 11:47 AM  Result Value Ref Range   Ferritin 140 24 - 336 ng/mL    Comment: Performed at [facility], [address] [place], [place] [address]  Reticulocytes     Status: Abnormal   Collection Time: 12/27/18 11:47 AM  Result Value Ref Range   Retic Ct Pct 1.4 0.4 - 3.1 %   RBC. 3.64 (L) 4.22 - 5.81 MIL/uL   Retic Count, Absolute 50.2 19.0 - 186.0 K/uL   Immature Retic Fract 7.7 2.3 - 15.9 %    Comment: Performed at [facility], [address] [place], [place] [address]    Blood Alcohol level:  Lab Results  Component Value Date   [facility] <10 [id number]    Metabolic Disorder  Labs: No results found for: HGBA1C, MPG No results  found for: PROLACTIN Lab Results  Component Value Date   CHOL 110 12/16/2018   TRIG 77 12/16/2018   HDL 40 (L) 12/16/2018   CHOLHDL 2.8 12/16/2018   VLDL 15 12/16/2018   LDLCALC 55 12/16/2018    Physical Findings: AIMS:  , ,  ,  ,    CIWA:    COWS:     Musculoskeletal: Strength & Muscle Tone: within normal limits Gait & Station: normal Patient leans: N/A  Psychiatric Specialty Exam: Physical Exam  Nursing note and vitals reviewed. Constitutional: He appears well-developed and well-nourished.  HENT:  Head: Normocephalic and atraumatic.  Eyes: Pupils are equal, round, and reactive to light. Conjunctivae are normal.  Neck: Normal range of motion.  Cardiovascular: Regular rhythm and normal heart sounds.  Respiratory: Effort normal. No respiratory distress.  GI: Soft.  Musculoskeletal: Normal range of motion.  Neurological: He is alert.  Skin: Skin is warm and dry.  Psychiatric: His affect is blunt. His speech is delayed. He is slowed. Cognition and memory are impaired. He expresses inappropriate judgment. He expresses no homicidal and no suicidal ideation.    Review of Systems  Constitutional: Negative.   HENT: Negative.   Eyes: Negative.   Respiratory: Negative.   Cardiovascular: Negative.   Gastrointestinal: Negative.   Musculoskeletal: Negative.   Skin: Negative.   Neurological: Negative.   Psychiatric/Behavioral: Negative.     Blood pressure 111/74, pulse 100, temperature 98 F (36.7 C), temperature source Oral, resp. rate 18, height 5\' 10"  (1.778 m), weight 90.3 kg, SpO2 97 %.Body mass index is 28.55 kg/m.  General Appearance: Casual  Eye Contact:  Good  Speech:  Slow  Volume:  Decreased  Mood:  Anxious  Affect:  Constricted  Thought Process:  Goal Directed  Orientation:  Full (Time, Place, and Person)  Thought Content:  Logical  Suicidal Thoughts:  No  Homicidal Thoughts:  No  Memory:  Immediate;    Fair Recent;   Fair Remote;   Fair  Judgement:  Fair  Insight:  Fair  Psychomotor Activity:  Normal  Concentration:  Concentration: Fair  Recall:  [employer] of Knowledge:  Fair  Language:  Fair  Akathisia:  Negative  Handed:  Right  AIMS (if indicated):     Assets:  Desire for Improvement  ADL's:  Intact  Cognition:  WNL  Sleep:  Number of Hours: 8     Treatment Plan Summary: Daily contact with patient to assess and evaluate symptoms and progress in treatment, Medication management and Plan Patient seen chart reviewed.  Patient says he is feeling better today.  Still looks slovenly and withdrawn most of the time but does get up and attend a few groups.  No new behavior problems.  I am going to review his medicine and see if there is anything that is making him sedated during the day and see if we can cut back on that.  Probably ready for discharge by Wednesday.  Alethia Berthold, MD 12/28/2018, 4:57 PM

## 2018-12-28 NOTE — [facility] Group Notes (Deleted)
CSW spoke with  Peter Parsons at [phone] about the patient's possible placement.  CSW was informed that they were "skeptical" to take patient due to history of wandering off.    CSW spoke with Peter Parsons at [facility].  He reports that they have not called placements at this time, though they plan on calling this afternoon.  He reports that he plans on calling Peter Parsons son at [phone] or [phone] and Peter Parsons at [phone].Marland Kitchen  He reports that he will follow up once he has called.  Assunta Curtis, MSW, LCSW 12/28/2018 2:03 PM

## 2018-12-28 NOTE — Plan of Care (Signed)
Patient is alert and oriented , stable and adequate , have not ask for PRNs , safe is maintained , mood is normal affect is good , socializing and watching television with peers with out any issues , patient denies SI,HI,AVH , sleep is restful , education and support is provided , 15 minutes safety checks is in place, no distress.   Problem: Education: Goal: Knowledge of [facility] Education information/materials will improve Outcome: Progressing   Problem: Coping: Goal: Ability to verbalize frustrations and anger appropriately will improve Outcome: Progressing Goal: Ability to demonstrate self-control will improve Outcome: Progressing   Problem: Safety: Goal: Periods of time without injury will increase Outcome: Progressing

## 2018-12-28 NOTE — Plan of Care (Signed)
Patient is verbalizing positive feeling about self , and has been pleasant , and maintaining safety in the unit , mood is appropriate , denies SI/HI/AVH , sleep is restful , only requiring routine visual checks no distress.   Problem: Education: Goal: Knowledge of  General Education information/materials will improve Outcome: Progressing   Problem: Coping: Goal: Ability to verbalize frustrations and anger appropriately will improve Outcome: Progressing Goal: Ability to demonstrate self-control will improve Outcome: Progressing   Problem: Safety: Goal: Periods of time without injury will increase Outcome: Progressing

## 2018-12-29 MED ORDER — IBUPROFEN 600 MG PO TABS
600.0000 mg | ORAL_TABLET | Freq: Four times a day (QID) | ORAL | Status: DC | PRN
  Administered 2018-12-29 – 2019-01-03 (×2): 600 mg via ORAL
  Filled 2018-12-29 (×2): qty 1

## 2018-12-29 NOTE — BHH Counselor (Signed)
CSW spoke with Peter Parsons at [facility] group [facility] and she reports the pt does not meet criteria to reside in the home. [name] raised concerns about the pt's past behavior and him wandering off from his last  facility.

## 2018-12-29 NOTE — Progress Notes (Signed)
Recreation Therapy Notes    Date: 12/29/2018  Time: 9:30 am  Location: Craft room  Behavioral response: Appropriate   Intervention Topic: Stress Management   Discussion/Intervention:   Group content on today was focused on stress. The group defined stress and way to cope with stress. Participants expressed how they know when they are stresses out. Individuals described the different ways they have to cope with stress. The group stated reasons why it is important to cope with stress. Patient explained what good stress is and some examples. The group participated in the intervention "Stress Management Jeopardy". Individuals were separated into two group and answered questions related to stress.  Clinical Observations/Feedback:  Patient came to group and was focused on what peers and staff had to say about stress. Participant participated in the intervention and left group early due to unknown reasons.  Jaccob Czaplicki LRT/CTRS         Tashaun Obey 12/29/2018 10:58 AM

## 2018-12-29 NOTE — BHH Counselor (Signed)
CSW left a confidential vm for legal guardian(Tasha Eulas Post, [place] [employer]) informing her of placement issue for the pt. CSW awaiting response. CSW left confidential vm for Adia Robinson([facility] care coordinator) informing her of placement issue, awaiting response.

## 2018-12-29 NOTE — BHH Counselor (Signed)
CSW received phone call from Vanna Scotland, [facility] care coordinator who suggested the pt ACT Team complete a group living application. She states the pt would be placed on group living high waitlist. She also states she will discuss with the ACT Team referring pt for an adult care home as an alternative. Adia requested contact information of the group homes CSW team has contacted and says she will follow up with them and provide clinical insight in an effort to gain placement. Adia request a conference call this week with CSW, legal guardian, ACT Team to address housing issue and devise appropriate discharge plan.

## 2018-12-29 NOTE — Progress Notes (Signed)
[facility] MD Progress Note  12/29/2018 2:12 PM Peter Parsons  MRN:  [id number] Subjective: Follow-up this gentleman with schizophrenia and obsessive type thinking.  Patient has been up out of bed and interacting with others much more appropriately.  He came to see me in my office today and told me that he was no longer obsessing about taking his clothing off.  He says his mood is still anxious and he still has hallucinations at times but is not having active suicidal thoughts.  He still seems a little bit slowed at times but is able to carry on a lucid conversation. Principal Problem: Schizophrenia ([place]) Diagnosis: Principal Problem:   Schizophrenia ([place])  Total Time spent with patient: 30 minutes  Past Psychiatric History: Patient has a history of schizophrenia with recent changes in his behavior that have resulted in no longer being able to live with his parents  Past Medical History:  Past Medical History:  Diagnosis Date  . Diabetes mellitus without complication ([place])   . Hypertension   . Schizophrenia ([place])    History reviewed. No pertinent surgical history. Family History: History reviewed. No pertinent family history. Family Psychiatric  History: See previous Social History:  Social History   Substance and Sexual Activity  Alcohol Use None     Social History   Substance and Sexual Activity  Drug Use Not on file    Social History   Socioeconomic History  . Marital status: Single    Spouse name: Not on file  . Number of children: Not on file  . Years of education: Not on file  . Highest education level: Not on file  Occupational History  . Not on file  Social Needs  . Financial resource strain: Not on file  . Food insecurity    Worry: Not on file    Inability: Not on file  . Transportation needs    Medical: Not on file    Non-medical: Not on file  Tobacco Use  . Smoking status: Never Smoker  . Smokeless tobacco: Never Used  Substance and Sexual Activity  . Alcohol  use: Not on file  . Drug use: Not on file  . Sexual activity: Not on file  Lifestyle  . Physical activity    Days per week: Not on file    Minutes per session: Not on file  . Stress: Not on file  Relationships  . Social Herbalist on phone: Not on file    Gets together: Not on file    Attends religious service: Not on file    Active member of club or organization: Not on file    Attends meetings of clubs or organizations: Not on file    Relationship status: Not on file  Other Topics Concern  . Not on file  Social History Narrative  . Not on file   Additional Social History:                         Sleep: Fair  Appetite:  Fair  Current Medications: Current Facility-Administered Medications  Medication Dose Route Frequency Provider Last Rate Last Dose  . acetaminophen (TYLENOL) tablet 650 mg  650 mg Oral Q6H PRN Cristofano, Dorene Ar, MD      . alum & mag hydroxide-simeth (MAALOX/MYLANTA) 200-200-20 MG/5ML suspension 30 mL  30 mL Oral Q4H PRN Cristofano, Dorene Ar, MD   30 mL at 12/20/18 1222  . benztropine (COGENTIN) tablet 0.5 mg  0.5  mg Oral BID , Madie Reno, MD   0.5 mg at 12/29/18 0804  . clonazePAM (KLONOPIN) tablet 0.5 mg  0.5 mg Oral BID , Madie Reno, MD   0.5 mg at 12/29/18 0804  . escitalopram (LEXAPRO) tablet 30 mg  30 mg Oral Daily Sharma Covert, MD   30 mg at 12/29/18 0804  . gabapentin (NEURONTIN) capsule 300 mg  300 mg Oral TID , Madie Reno, MD   300 mg at 12/29/18 1126  . lisinopril (ZESTRIL) tablet 20 mg  20 mg Oral Daily , Madie Reno, MD   20 mg at 12/29/18 0804  . loperamide (IMODIUM) capsule 2 mg  2 mg Oral PRN , Madie Reno, MD   2 mg at 12/20/18 2142  . LORazepam (ATIVAN) tablet 1 mg  1 mg Oral Q4H PRN Cristofano, Dorene Ar, MD   1 mg at 12/26/18 2109  . magnesium hydroxide (MILK OF MAGNESIA) suspension 30 mL  30 mL Oral Daily PRN Cristofano, Paul A, MD      . metFORMIN (GLUCOPHAGE) tablet 500 mg  500 mg Oral BID WC ,  Madie Reno, MD   500 mg at 12/29/18 0804  . nicotine (NICODERM CQ - dosed in mg/24 hours) patch 14 mg  14 mg Transdermal Daily , Madie Reno, MD   14 mg at 12/29/18 0805  . [START ON 01/14/2019] paliperidone (INVEGA SUSTENNA) injection 234 mg  234 mg Intramuscular Q28 days Money, Lowry Ram, FNP      . traZODone (DESYREL) tablet 100 mg  100 mg Oral QHS PRN Caroline Sauger, NP   100 mg at 12/27/18 2118  . tuberculin injection 5 Units  5 Units Intradermal Once , Madie Reno, MD   5 Units at 12/28/18 1230    Lab Results: No results found for this or any previous visit (from the past 48 hour(s)).  Blood Alcohol level:  Lab Results  Component Value Date   ETH <10 99991111    Metabolic Disorder Labs: No results found for: HGBA1C, MPG No results found for: PROLACTIN Lab Results  Component Value Date   CHOL 110 12/16/2018   TRIG 77 12/16/2018   HDL 40 (L) 12/16/2018   CHOLHDL 2.8 12/16/2018   VLDL 15 12/16/2018   LDLCALC 55 12/16/2018    Physical Findings: AIMS: Facial and Oral Movements Muscles of Facial Expression: None, normal Lips and Perioral Area: None, normal Jaw: None, normal Tongue: None, normal,Extremity Movements Upper (arms, wrists, hands, fingers): None, normal Lower (legs, knees, ankles, toes): None, normal, Trunk Movements Neck, shoulders, hips: None, normal, Overall Severity Severity of abnormal movements (highest score from questions above): None, normal Incapacitation due to abnormal movements: None, normal Patient's awareness of abnormal movements (rate only patient's report): No Awareness, Dental Status Current problems with teeth and/or dentures?: No Does patient usually wear dentures?: No  CIWA:  CIWA-Ar Total: 3 COWS:  COWS Total Score: 2  Musculoskeletal: Strength & Muscle Tone: within normal limits Gait & Station: normal Patient leans: N/A  Psychiatric Specialty Exam: Physical Exam  Nursing note and vitals reviewed. Constitutional: He appears  well-developed and well-nourished.  HENT:  Head: Normocephalic and atraumatic.  Eyes: Pupils are equal, round, and reactive to light. Conjunctivae are normal.  Neck: Normal range of motion.  Cardiovascular: Regular rhythm and normal heart sounds.  Respiratory: Effort normal. No respiratory distress.  GI: Soft.  Musculoskeletal: Normal range of motion.  Neurological: He is alert.  Skin: Skin is warm and dry.  Psychiatric: Judgment normal. His  affect is blunt. His speech is delayed. He is slowed. Cognition and memory are impaired. He expresses no homicidal and no suicidal ideation.    Review of Systems  Constitutional: Negative.   HENT: Negative.   Eyes: Negative.   Respiratory: Negative.   Cardiovascular: Negative.   Gastrointestinal: Negative.   Musculoskeletal: Negative.   Skin: Negative.   Neurological: Negative.   Psychiatric/Behavioral: Positive for hallucinations. Negative for depression, memory loss, substance abuse and suicidal ideas. The patient is nervous/anxious. The patient does not have insomnia.     Blood pressure 138/77, pulse 89, temperature 99.2 F (37.3 C), temperature source Oral, resp. rate 18, height 5\' 10"  (1.778 m), weight 90.3 kg, SpO2 97 %.Body mass index is 28.55 kg/m.  General Appearance: Casual  Eye Contact:  Good  Speech:  Clear and Coherent  Volume:  Normal  Mood:  Dysphoric  Affect:  Congruent  Thought Process:  Coherent  Orientation:  Full (Time, Place, and Person)  Thought Content:  Rumination  Suicidal Thoughts:  No  Homicidal Thoughts:  No  Memory:  Immediate;   Fair Recent;   Fair Remote;   Fair  Judgement:  Impaired  Insight:  Shallow  Psychomotor Activity:  Normal  Concentration:  Concentration: Fair  Recall:  [employer] of Knowledge:  Fair  Language:  Fair  Akathisia:  No  Handed:  Right  AIMS (if indicated):     Assets:  Desire for Improvement Housing Physical Health Social Support  ADL's:  Intact  Cognition:  Impaired,   Mild  Sleep:  Number of Hours: 6.25     Treatment Plan Summary: Daily contact with patient to assess and evaluate symptoms and progress in treatment, Medication management and Plan I gave him a lot of support and reassurance that I thought he was doing quite well under the circumstances.  We are still stuck without a place for him to stay while waiting on his guardian to come through.  Encouraged and praised his attendance at groups.  No clear indication to change medicine today.  He talked with me about how Risperdal Consta had been helpful for him in the past.  I spoke with him about how the current Invega long-acting is very similar to Risperdal Consta but that it would not be appropriate to try and switch him over even if that were the right thing to do because he already has so much Invega and him he is having some akathisia.  Patient expressed understanding.  I do think things are getting better and we are mostly in the end phase of trying to find discharge plans.  Alethia Berthold, MD 12/29/2018, 2:12 PM

## 2018-12-29 NOTE — Plan of Care (Signed)
D- Patient alert and oriented. Patient presents in an anxious/depressed mood on assessment stating that he didn't sleep well last night, although he reported that he slept "fair" on his self-inventory. Patient continues to endorse AH, telling him to "take your clothes off". Patient also endorses depression and anxiety, stating "the thought of killing myself" and "the thought of dying" is making him feel this way. Patient denies SI, HI, VH, and pain at this time. Patient's goal for today is "getting released", in which he will "pray", in order to accomplish his goal.  A- Scheduled medications administered to patient, per MD orders. Support and encouragement provided.  Routine safety checks conducted every 15 minutes.  Patient informed to notify staff with problems or concerns.  R- No adverse drug reactions noted. Patient contracts for safety at this time. Patient compliant with medications and treatment plan. Patient receptive, calm, and cooperative. Patient interacts well with others on the unit.  Patient remains safe at this time.  Problem: Education: Goal: Knowledge of [facility] Education information/materials will improve Outcome: Progressing   Problem: Coping: Goal: Ability to verbalize frustrations and anger appropriately will improve Outcome: Progressing Goal: Ability to demonstrate self-control will improve Outcome: Progressing   Problem: Safety: Goal: Periods of time without injury will increase Outcome: Progressing

## 2018-12-29 NOTE — BHH Counselor (Signed)
LCSW Group Therapy Note  12/29/2018 1:00 PM  Type of Therapy/Topic:  Group Therapy:  Feelings about Diagnosis  Participation Level:  Minimal   Description of Group:   This group will allow patients to explore their thoughts and feelings about diagnoses they have received. Patients will be guided to explore their level of understanding and acceptance of these diagnoses. Facilitator will encourage patients to process their thoughts and feelings about the reactions of others to their diagnosis and will guide patients in identifying ways to discuss their diagnosis with significant others in their lives. This group will be process-oriented, with patients participating in exploration of their own experiences, giving and receiving support, and processing challenge from other group members.   Therapeutic Goals: 1. Patient will demonstrate understanding of diagnosis as evidenced by identifying two or more symptoms of the disorder 2. Patient will be able to express two feelings regarding the diagnosis 3. Patient will demonstrate their ability to communicate their needs through discussion and/or role play  Summary of Patient Progress: Patient was present in group. Patient appeared to struggle with understanding the diagnoses dicussed.  Patient did not share in group how the diagnosis or his diagnosis made him feel.    Therapeutic Modalities:   Cognitive Behavioral Therapy Brief Therapy Feelings Identification   Assunta Curtis, MSW, LCSW 12/29/2018 11:48 AM

## 2018-12-29 NOTE — Progress Notes (Signed)
CSW contacted Towner Blas [phone] who stated her home is for IDD patients only.   Evalina Field, MSW, LCSW Clinical Social Work 12/29/2018 10:31 AM

## 2018-12-30 NOTE — Progress Notes (Signed)
D- Patient alert and oriented. Patient presents in an anxious/depressed mood on assessment he sleep well last night. Patient continues to endorse AH, telling him to "take your clothes off". Patient also endorses depression and anxiety, stating "the thought of killing myself" and "the thought of dying" is making him feel this way. Patient denies SI, HI, VH, and pain at this time. Patient's goal for today is "getting released", in which he will "pray", in order to accomplish his goal.  A- Scheduled medications administered to patient, per MD orders. Support and encouragement provided.  Routine safety checks conducted every 15 minutes.  Patient informed to notify staff with problems or concerns. Pt complained of some back pain and received ibuprofen. Pain was resolved.  R- No adverse drug reactions noted. Patient contracts for safety at this time. Patient compliant with medications and treatment plan. Patient receptive, calm, and cooperative. Patient interacts well with others on the unit.  Patient remains safe at this time.

## 2018-12-30 NOTE — B[facility] Group Notes (Signed)
Emotional Regulation 12/30/2018 1PM  Type of Therapy/Topic:  Group Therapy:  Emotion Regulation  Participation Level:  Did Not Attend   Description of Group:   The purpose of this group is to assist patients in learning to regulate negative emotions and experience positive emotions. Patients will be guided to discuss ways in which they have been vulnerable to their negative emotions. These vulnerabilities will be juxtaposed with experiences of positive emotions or situations, and patients will be challenged to use positive emotions to combat negative ones. Special emphasis will be placed on coping with negative emotions in conflict situations, and patients will process healthy conflict resolution skills.  Therapeutic Goals: 1. Patient will identify two positive emotions or experiences to reflect on in order to balance out negative emotions 2. Patient will label two or more emotions that they find the most difficult to experience 3. Patient will demonstrate positive conflict resolution skills through discussion and/or role plays  Summary of Patient Progress:       Therapeutic Modalities:   Cognitive Behavioral Therapy Feelings Identification Dialectical Behavioral Therapy   Yvette Rack, LCSW 12/30/2018 2:08 PM

## 2018-12-30 NOTE — Progress Notes (Signed)
PPD result

## 2018-12-30 NOTE — BHH Counselor (Signed)
CSW spoke with Allene Pyo, [phone].  She reports that she has reviewed the paperwork but is unsure of the patient "stripping".  CSW pointed out that patient has not acted on his thoughts of disrobing but that he says that he feel like it.  CSW pointed out that patient was asked not to return to his previous placement for wandering.    CSW was asked to fax his FL2.   CSW received confirmation that fax was successful. She reports that she will call this CSW back.  Assunta Curtis, MSW, LCSW 12/30/2018 1:54 PM

## 2018-12-30 NOTE — Progress Notes (Signed)
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 Joaquim Lai reported she would contact CSW back with a day they will be willing to come get pt as she has to call Lucy Antigua. Joaquim Lai reported that pt would need to come in with his funds in place already so CSW team need to make sure the legal guardian is aware and has the funds in order.  CSW contacted pts legal guardian and left a voicemail requesting call back.  CSW contacts pts care coordinator and left a voicemail requesting call back.  CSW contacted Cecilie Lowers with pts ACT team left a voicemail requesting call back.   Evalina Field, MSW, LCSW Clinical Social Work 12/30/2018 2:18 PM

## 2018-12-30 NOTE — BHH Counselor (Signed)
CSW spoke with Lucy Antigua at [place].  CSW was asked to make sure that the patient has a Covid-19 test, TB test and has funds.   CSW informed that calls have been made to patient's guardian and ACT team and at this time no return calls have been received.    Assunta Curtis, MSW, LCSW 12/30/2018 2:59 PM

## 2018-12-30 NOTE — Progress Notes (Signed)
Recreation Therapy Notes   Date: 12/30/2018  Time: 9:30 am   Location: Craft room   Behavioral response: N/A   Intervention Topic: Necessities    Discussion/Intervention: Patient did not attend group.   Clinical Observations/Feedback:  Patient did not attend group.   Nicanor Mendolia LRT/CTRS        Bach Rocchi 12/30/2018 10:55 AM

## 2018-12-30 NOTE — Progress Notes (Signed)
   12/30/18 1400  PPD Results  Does patient have an induration at the injection site? No  Induration(mm) 0 mm  Name of Physician Notified Dr.Clapacs

## 2018-12-30 NOTE — Plan of Care (Signed)
Patient states "I don't feel like stripping my cloths off,that's an improvement." Patient stated that he feels somewhat tired and stayed in bed.Denies SI,HI and AVH.Compliant with medications.Appetite good.Support and encouragement given.

## 2018-12-30 NOTE — Progress Notes (Signed)
[facility] MD Progress Note  12/30/2018 12:06 PM Peter Parsons  MRN:  [id number]   Subjective: This is a follow-up on a patient diagnosed with schizophrenia.  Patient reports today that he is feeling pretty good.  He states that he is just wanted to rest some today and has stayed in the bed.  Patient then states that he has not been having any thoughts about wanting to take his clothes off in front of people and states that he is glad that that is went away.  He denies having any suicidal or homicidal ideations and denies any hallucinations.  Patient reports that he feels the medication is working and has no complaints about the medication at this time.  Principal Problem: Schizophrenia ([place]) Diagnosis: Principal Problem:   Schizophrenia ([place])  Total Time spent with patient: 20 minutes  Past Psychiatric History: Patient has a history of schizophrenia with recent changes in his behavior that have resulted in no longer being able to live with his parents  Past Medical History:  Past Medical History:  Diagnosis Date  . Diabetes mellitus without complication ([place])   . Hypertension   . Schizophrenia ([place])    History reviewed. No pertinent surgical history. Family History: History reviewed. No pertinent family history. Family Psychiatric  History: None known Social History:  Social History   Substance and Sexual Activity  Alcohol Use None     Social History   Substance and Sexual Activity  Drug Use Not on file    Social History   Socioeconomic History  . Marital status: Single    Spouse name: Not on file  . Number of children: Not on file  . Years of education: Not on file  . Highest education level: Not on file  Occupational History  . Not on file  Social Needs  . Financial resource strain: Not on file  . Food insecurity    Worry: Not on file    Inability: Not on file  . Transportation needs    Medical: Not on file    Non-medical: Not on file  Tobacco Use  . Smoking status:  Never Smoker  . Smokeless tobacco: Never Used  Substance and Sexual Activity  . Alcohol use: Not on file  . Drug use: Not on file  . Sexual activity: Not on file  Lifestyle  . Physical activity    Days per week: Not on file    Minutes per session: Not on file  . Stress: Not on file  Relationships  . Social Herbalist on phone: Not on file    Gets together: Not on file    Attends religious service: Not on file    Active member of club or organization: Not on file    Attends meetings of clubs or organizations: Not on file    Relationship status: Not on file  Other Topics Concern  . Not on file  Social History Narrative  . Not on file   Additional Social History:                         Sleep: Good  Appetite:  Good  Current Medications: Current Facility-Administered Medications  Medication Dose Route Frequency Provider Last Rate Last Dose  . acetaminophen (TYLENOL) tablet 650 mg  650 mg Oral Q6H PRN Cristofano, Paul A, MD      . alum & mag hydroxide-simeth (MAALOX/MYLANTA) 200-200-20 MG/5ML suspension 30 mL  30 mL Oral Q4H PRN Cristofano,  Dorene Ar, MD   30 mL at 12/20/18 1222  . benztropine (COGENTIN) tablet 0.5 mg  0.5 mg Oral BID Clapacs, Madie Reno, MD   0.5 mg at 12/30/18 0734  . clonazePAM (KLONOPIN) tablet 0.5 mg  0.5 mg Oral BID Clapacs, Madie Reno, MD   0.5 mg at 12/30/18 0735  . escitalopram (LEXAPRO) tablet 30 mg  30 mg Oral Daily Sharma Covert, MD   30 mg at 12/30/18 0734  . gabapentin (NEURONTIN) capsule 300 mg  300 mg Oral TID Clapacs, Madie Reno, MD   300 mg at 12/30/18 1137  . ibuprofen (ADVIL) tablet 600 mg  600 mg Oral Q6H PRN Caroline Sauger, NP   600 mg at 12/29/18 2043  . lisinopril (ZESTRIL) tablet 20 mg  20 mg Oral Daily Clapacs, Madie Reno, MD   20 mg at 12/30/18 0735  . loperamide (IMODIUM) capsule 2 mg  2 mg Oral PRN Clapacs, Madie Reno, MD   2 mg at 12/20/18 2142  . LORazepam (ATIVAN) tablet 1 mg  1 mg Oral Q4H PRN Cristofano, Dorene Ar, MD   1  mg at 12/26/18 2109  . magnesium hydroxide (MILK OF MAGNESIA) suspension 30 mL  30 mL Oral Daily PRN Cristofano, Paul A, MD      . metFORMIN (GLUCOPHAGE) tablet 500 mg  500 mg Oral BID WC Clapacs, John T, MD   500 mg at 12/30/18 0735  . nicotine (NICODERM CQ - dosed in mg/24 hours) patch 14 mg  14 mg Transdermal Daily Clapacs, Madie Reno, MD   14 mg at 12/30/18 0735  . [START ON 01/14/2019] paliperidone (INVEGA SUSTENNA) injection 234 mg  234 mg Intramuscular Q28 days Adnan Vanvoorhis, Lowry Ram, FNP      . traZODone (DESYREL) tablet 100 mg  100 mg Oral QHS PRN Caroline Sauger, NP   100 mg at 12/29/18 2044  . tuberculin injection 5 Units  5 Units Intradermal Once Clapacs, Madie Reno, MD   5 Units at 12/28/18 1230    Lab Results: No results found for this or any previous visit (from the past 48 hour(s)).  Blood Alcohol level:  Lab Results  Component Value Date   ETH <10 99991111    Metabolic Disorder Labs: No results found for: HGBA1C, MPG No results found for: PROLACTIN Lab Results  Component Value Date   CHOL 110 12/16/2018   TRIG 77 12/16/2018   HDL 40 (L) 12/16/2018   CHOLHDL 2.8 12/16/2018   VLDL 15 12/16/2018   LDLCALC 55 12/16/2018    Physical Findings: AIMS: Facial and Oral Movements Muscles of Facial Expression: None, normal Lips and Perioral Area: None, normal Jaw: None, normal Tongue: None, normal,Extremity Movements Upper (arms, wrists, hands, fingers): None, normal Lower (legs, knees, ankles, toes): None, normal, Trunk Movements Neck, shoulders, hips: None, normal, Overall Severity Severity of abnormal movements (highest score from questions above): None, normal Incapacitation due to abnormal movements: None, normal Patient's awareness of abnormal movements (rate only patient's report): No Awareness, Dental Status Current problems with teeth and/or dentures?: No Does patient usually wear dentures?: No  CIWA:  CIWA-Ar Total: 3 COWS:  COWS Total Score:  2  Musculoskeletal: Strength & Muscle Tone: within normal limits Gait & Station: normal Patient leans: N/A  Psychiatric Specialty Exam: Physical Exam  Nursing note and vitals reviewed. Constitutional: He is oriented to person, place, and time. He appears well-developed and well-nourished.  Cardiovascular: Normal rate.  Respiratory: Effort normal.  Musculoskeletal: Normal range of motion.  Neurological: He  is alert and oriented to person, place, and time.  Skin: Skin is warm.  Psychiatric: His affect is blunt. His speech is delayed. He is slowed. Cognition and memory are impaired.    Review of Systems  Constitutional: Negative.   HENT: Negative.   Eyes: Negative.   Respiratory: Negative.   Cardiovascular: Negative.   Gastrointestinal: Negative.   Genitourinary: Negative.   Musculoskeletal: Negative.   Skin: Negative.   Neurological: Negative.   Endo/Heme/Allergies: Negative.   Psychiatric/Behavioral: Negative.     Blood pressure 121/77, pulse 91, temperature 98 F (36.7 C), temperature source Oral, resp. rate 17, height 5\' 10"  (1.778 m), weight 90.3 kg, SpO2 97 %.Body mass index is 28.55 kg/m.  General Appearance: Disheveled  Eye Contact:  Good  Speech:  Slow  Volume:  Decreased  Mood:  Dysphoric  Affect:  Congruent  Thought Process:  Coherent and Descriptions of Associations: Intact  Orientation:  Full (Time, Place, and Person)  Thought Content:  Rumination  Suicidal Thoughts:  No  Homicidal Thoughts:  No  Memory:  Immediate;   Fair Recent;   Fair Remote;   Fair  Judgement:  Impaired  Insight:  Shallow  Psychomotor Activity:  Normal  Concentration:  Concentration: Fair  Recall:  [employer] of Knowledge:  Fair  Language:  Fair  Akathisia:  No  Handed:  Right  AIMS (if indicated):     Assets:  Desire for Improvement Housing Physical Health Social Support  ADL's:  Intact  Cognition:  Impaired,  Mild  Sleep:  Number of Hours: 8.25   Assessment: Patient  presents in his room lying in bed and is asleep.  Patient awakens easily.  Patient is pleasant, calm, and cooperative during the evaluation.  Patient seems excited to inform me that he is no longer having thoughts of wanting to get naked in front of people.  Patient seems to have stabilized more and reporting less psychotic symptoms.  Patient is still waiting placement into new facility and CSW has continue working with patient's caseworker.  Treatment Plan Summary: Daily contact with patient to assess and evaluate symptoms and progress in treatment and Medication management   Continue Cogentin 0.5 mg p.o. twice daily for EPS Continue Klonopin 0.5 mg p.o. twice daily for anxiety Continue Lexapro 30 mg p.o. daily for depression Continue Neurontin 300 mg p.o. 3 times daily Continue Invega Sustenna IM 234 mg every 28 days next dose due 01/14/2019 Continue every 15 minute safety checks Encourage group therapy participation CSW to continue assisting with placement  Lewis Shock, FNP 12/30/2018, 12:06 PM

## 2018-12-31 NOTE — Progress Notes (Signed)
CSW spoke with pts legal guardian, Lucio Edward [phone] and informed her of potential placement for pt. CSW provided LG with group home address and contact information for [facility] to coordinate payment.   Evalina Field, MSW, LCSW Clinical Social Work 12/31/2018 9:40 AM

## 2018-12-31 NOTE — Progress Notes (Signed)
[facility] MD Progress Note  12/31/2018 2:52 PM Peter Parsons  MRN:  [id number] Subjective: Follow-up for this patient with schizophrenia.  Patient continues to state that he is feeling better.  No longer constantly troubled by intrusive thoughts.  Not expressing that he is having any hallucinations or delusions.  No suicidal thoughts expressed.  Mostly cooperative taking care of himself without any new complaints. Principal Problem: Schizophrenia ([place]) Diagnosis: Principal Problem:   Schizophrenia ([place])  Total Time spent with patient: 30 minutes  Past Psychiatric History: Past psychiatric history of schizophrenia  Past Medical History:  Past Medical History:  Diagnosis Date  . Diabetes mellitus without complication ([place])   . Hypertension   . Schizophrenia ([place])    History reviewed. No pertinent surgical history. Family History: History reviewed. No pertinent family history. Family Psychiatric  History: See previous Social History:  Social History   Substance and Sexual Activity  Alcohol Use None     Social History   Substance and Sexual Activity  Drug Use Not on file    Social History   Socioeconomic History  . Marital status: Single    Spouse name: Not on file  . Number of children: Not on file  . Years of education: Not on file  . Highest education level: Not on file  Occupational History  . Not on file  Social Needs  . Financial resource strain: Not on file  . Food insecurity    Worry: Not on file    Inability: Not on file  . Transportation needs    Medical: Not on file    Non-medical: Not on file  Tobacco Use  . Smoking status: Never Smoker  . Smokeless tobacco: Never Used  Substance and Sexual Activity  . Alcohol use: Not on file  . Drug use: Not on file  . Sexual activity: Not on file  Lifestyle  . Physical activity    Days per week: Not on file    Minutes per session: Not on file  . Stress: Not on file  Relationships  . Social Herbalist on  phone: Not on file    Gets together: Not on file    Attends religious service: Not on file    Active member of club or organization: Not on file    Attends meetings of clubs or organizations: Not on file    Relationship status: Not on file  Other Topics Concern  . Not on file  Social History Narrative  . Not on file   Additional Social History:                         Sleep: Fair  Appetite:  Fair  Current Medications: Current Facility-Administered Medications  Medication Dose Route Frequency Provider Last Rate Last Dose  . acetaminophen (TYLENOL) tablet 650 mg  650 mg Oral Q6H PRN Cristofano, Dorene Ar, MD      . alum & mag hydroxide-simeth (MAALOX/MYLANTA) 200-200-20 MG/5ML suspension 30 mL  30 mL Oral Q4H PRN Cristofano, Dorene Ar, MD   30 mL at 12/20/18 1222  . benztropine (COGENTIN) tablet 0.5 mg  0.5 mg Oral BID Clapacs, Madie Reno, MD   0.5 mg at 12/31/18 0751  . clonazePAM (KLONOPIN) tablet 0.5 mg  0.5 mg Oral BID Clapacs, Madie Reno, MD   0.5 mg at 12/31/18 0749  . escitalopram (LEXAPRO) tablet 30 mg  30 mg Oral Daily Sharma Covert, MD   30 mg at  12/31/18 0751  . gabapentin (NEURONTIN) capsule 300 mg  300 mg Oral TID Clapacs, Madie Reno, MD   300 mg at 12/31/18 1158  . ibuprofen (ADVIL) tablet 600 mg  600 mg Oral Q6H PRN Caroline Sauger, NP   600 mg at 12/29/18 2043  . lisinopril (ZESTRIL) tablet 20 mg  20 mg Oral Daily Clapacs, Madie Reno, MD   20 mg at 12/31/18 0751  . loperamide (IMODIUM) capsule 2 mg  2 mg Oral PRN Clapacs, Madie Reno, MD   2 mg at 12/20/18 2142  . LORazepam (ATIVAN) tablet 1 mg  1 mg Oral Q4H PRN Cristofano, Dorene Ar, MD   1 mg at 12/26/18 2109  . magnesium hydroxide (MILK OF MAGNESIA) suspension 30 mL  30 mL Oral Daily PRN Cristofano, Paul A, MD      . metFORMIN (GLUCOPHAGE) tablet 500 mg  500 mg Oral BID WC Clapacs, Madie Reno, MD   500 mg at 12/31/18 0751  . nicotine (NICODERM CQ - dosed in mg/24 hours) patch 14 mg  14 mg Transdermal Daily Clapacs, Madie Reno, MD    14 mg at 12/31/18 0753  . [START ON 01/14/2019] paliperidone (INVEGA SUSTENNA) injection 234 mg  234 mg Intramuscular Q28 days Money, Lowry Ram, FNP      . traZODone (DESYREL) tablet 100 mg  100 mg Oral QHS PRN Caroline Sauger, NP   100 mg at 12/30/18 2210    Lab Results: No results found for this or any previous visit (from the past 48 hour(s)).  Blood Alcohol level:  Lab Results  Component Value Date   ETH <10 99991111    Metabolic Disorder Labs: No results found for: HGBA1C, MPG No results found for: PROLACTIN Lab Results  Component Value Date   CHOL 110 12/16/2018   TRIG 77 12/16/2018   HDL 40 (L) 12/16/2018   CHOLHDL 2.8 12/16/2018   VLDL 15 12/16/2018   LDLCALC 55 12/16/2018    Physical Findings: AIMS: Facial and Oral Movements Muscles of Facial Expression: None, normal Lips and Perioral Area: None, normal Jaw: None, normal Tongue: None, normal,Extremity Movements Upper (arms, wrists, hands, fingers): None, normal Lower (legs, knees, ankles, toes): None, normal, Trunk Movements Neck, shoulders, hips: None, normal, Overall Severity Severity of abnormal movements (highest score from questions above): None, normal Incapacitation due to abnormal movements: None, normal Patient's awareness of abnormal movements (rate only patient's report): No Awareness, Dental Status Current problems with teeth and/or dentures?: No Does patient usually wear dentures?: No  CIWA:  CIWA-Ar Total: 3 COWS:  COWS Total Score: 2  Musculoskeletal: Strength & Muscle Tone: within normal limits Gait & Station: normal Patient leans: N/A  Psychiatric Specialty Exam: Physical Exam  Nursing note and vitals reviewed. Constitutional: He appears well-developed and well-nourished.  HENT:  Head: Normocephalic and atraumatic.  Eyes: Pupils are equal, round, and reactive to light. Conjunctivae are normal.  Neck: Normal range of motion.  Cardiovascular: Regular rhythm and normal heart sounds.   Respiratory: Effort normal.  GI: Soft.  Musculoskeletal: Normal range of motion.  Neurological: He is alert.  Skin: Skin is warm and dry.  Psychiatric: Judgment normal. His affect is blunt. His speech is delayed. He is slowed. Thought content is not delusional. Cognition and memory are impaired. He expresses no homicidal and no suicidal ideation.    Review of Systems  Constitutional: Negative.   HENT: Negative.   Eyes: Negative.   Respiratory: Negative.   Cardiovascular: Negative.   Gastrointestinal: Negative.   Musculoskeletal:  Negative.   Skin: Negative.   Neurological: Negative.   Psychiatric/Behavioral: Negative for depression, hallucinations, memory loss, substance abuse and suicidal ideas. The patient is not nervous/anxious and does not have insomnia.     Blood pressure 112/73, pulse 81, temperature 97.8 F (36.6 C), temperature source Oral, resp. rate 17, height 5\' 10"  (1.778 m), weight 90.3 kg, SpO2 98 %.Body mass index is 28.55 kg/m.  General Appearance: Casual  Eye Contact:  Good  Speech:  Clear and Coherent  Volume:  Normal  Mood:  Euthymic  Affect:  Constricted  Thought Process:  Coherent  Orientation:  Full (Time, Place, and Person)  Thought Content:  Logical  Suicidal Thoughts:  No  Homicidal Thoughts:  No  Memory:  Immediate;   Fair Recent;   Fair Remote;   Fair  Judgement:  Fair  Insight:  Fair  Psychomotor Activity:  Normal  Concentration:  Concentration: Fair  Recall:  [employer] of Knowledge:  Fair  Language:  Fair  Akathisia:  No  Handed:  Right  AIMS (if indicated):     Assets:  Desire for Improvement  ADL's:  Impaired  Cognition:  Impaired,  Mild  Sleep:  Number of Hours: 6.5     Treatment Plan Summary: Daily contact with patient to assess and evaluate symptoms and progress in treatment, Medication management and Plan Doing pretty well.  Seems stable for now on his medicine.  Treatment team is working with his guardian to find some kind of  living situation for him.  Alethia Berthold, MD 12/31/2018, 2:52 PM

## 2018-12-31 NOTE — [facility] Group Notes (Signed)
LCSW Group Therapy Note  12/31/2018 11:26 AM  Type of Therapy/Topic:  Group Therapy:  Balance in Life  Participation Level:  Did Not Attend  Description of Group:    This group will address the concept of balance and how it feels and looks when one is unbalanced. Patients will be encouraged to process areas in their lives that are out of balance and identify reasons for remaining unbalanced. Facilitators will guide patients in utilizing problem-solving interventions to address and correct the stressor making their life unbalanced. Understanding and applying boundaries will be explored and addressed for obtaining and maintaining a balanced life. Patients will be encouraged to explore ways to assertively make their unbalanced needs known to significant others in their lives, using other group members and facilitator for support and feedback.  Therapeutic Goals: 1. Patient will identify two or more emotions or situations they have that consume much of in their lives. 2. Patient will identify signs/triggers that life has become out of balance:  3. Patient will identify two ways to set boundaries in order to achieve balance in their lives:  4. Patient will demonstrate ability to communicate their needs through discussion and/or role plays  Summary of Patient Progress: x     Therapeutic Modalities:   Cognitive Behavioral Therapy Solution-Focused Therapy Assertiveness Training  Evalina Field, MSW, LCSW Clinical Social Work 12/31/2018 11:26 AM

## 2018-12-31 NOTE — Tx Team (Signed)
Interdisciplinary Treatment and Diagnostic Plan Update  12/31/2018 Time of Session: 830AM Peter Parsons MRN: [id number]  Principal Diagnosis: Schizophrenia [facility])  Secondary Diagnoses: Principal Problem:   Schizophrenia ([place])   Current Medications:  Current Facility-Administered Medications  Medication Dose Route Frequency Provider Last Rate Last Dose  . acetaminophen (TYLENOL) tablet 650 mg  650 mg Oral Q6H PRN Cristofano, Dorene Ar, MD      . alum & mag hydroxide-simeth (MAALOX/MYLANTA) 200-200-20 MG/5ML suspension 30 mL  30 mL Oral Q4H PRN Cristofano, Dorene Ar, MD   30 mL at 12/20/18 1222  . benztropine (COGENTIN) tablet 0.5 mg  0.5 mg Oral BID Clapacs, Madie Reno, MD   0.5 mg at 12/31/18 0751  . clonazePAM (KLONOPIN) tablet 0.5 mg  0.5 mg Oral BID Clapacs, Madie Reno, MD   0.5 mg at 12/31/18 0749  . escitalopram (LEXAPRO) tablet 30 mg  30 mg Oral Daily Sharma Covert, MD   30 mg at 12/31/18 0751  . gabapentin (NEURONTIN) capsule 300 mg  300 mg Oral TID Clapacs, Madie Reno, MD   300 mg at 12/31/18 0751  . ibuprofen (ADVIL) tablet 600 mg  600 mg Oral Q6H PRN Caroline Sauger, NP   600 mg at 12/29/18 2043  . lisinopril (ZESTRIL) tablet 20 mg  20 mg Oral Daily Clapacs, Madie Reno, MD   20 mg at 12/31/18 0751  . loperamide (IMODIUM) capsule 2 mg  2 mg Oral PRN Clapacs, Madie Reno, MD   2 mg at 12/20/18 2142  . LORazepam (ATIVAN) tablet 1 mg  1 mg Oral Q4H PRN Cristofano, Dorene Ar, MD   1 mg at 12/26/18 2109  . magnesium hydroxide (MILK OF MAGNESIA) suspension 30 mL  30 mL Oral Daily PRN Cristofano, Paul A, MD      . metFORMIN (GLUCOPHAGE) tablet 500 mg  500 mg Oral BID WC Clapacs, Madie Reno, MD   500 mg at 12/31/18 0751  . nicotine (NICODERM CQ - dosed in mg/24 hours) patch 14 mg  14 mg Transdermal Daily Clapacs, Madie Reno, MD   14 mg at 12/31/18 0753  . [START ON 01/14/2019] paliperidone (INVEGA SUSTENNA) injection 234 mg  234 mg Intramuscular Q28 days Money, Lowry Ram, FNP      . traZODone (DESYREL) tablet 100 mg   100 mg Oral QHS PRN Caroline Sauger, NP   100 mg at 12/30/18 2210   PTA Medications: Medications Prior to Admission  Medication Sig Dispense Refill Last Dose  . buPROPion (WELLBUTRIN XL) 150 MG 24 hr tablet Take 150 mg by mouth daily.   12/14/2018 at 0800  . gabapentin (NEURONTIN) 300 MG capsule Take 300 mg by mouth 3 (three) times daily.   12/14/2018 at 0800  . KLONOPIN 1 MG tablet Take 1 mg by mouth 3 (three) times daily.   12/14/2018 at 0800  . lisinopril-hydrochlorothiazide (ZESTORETIC) 20-25 MG tablet Take 1 tablet by mouth daily.   12/14/2018 at 0800  . metFORMIN (GLUCOPHAGE) 1000 MG tablet Take 1,000 mg by mouth 2 (two) times daily.   12/14/2018 at 0800  . paliperidone (INVEGA) 6 MG 24 hr tablet Take 6 mg by mouth daily.   12/14/2018 at 0800  . prazosin (MINIPRESS) 1 MG capsule Take 1 mg by mouth at bedtime.   Past Week at Unknown time  . risperiDONE microspheres (RISPERDAL CONSTA) 25 MG injection Inject 25 mg into the muscle every 14 (fourteen) days.   12/13/2018 at Unknown time  . simvastatin (ZOCOR) 20 MG tablet Take 20  mg by mouth daily.   12/14/2018 at 0800  . tamsulosin (FLOMAX) 0.4 MG CAPS capsule Take 0.4 mg by mouth daily.   12/14/2018 at 0800  . traZODone (DESYREL) 150 MG tablet Take 150 mg by mouth at bedtime as needed.   12/13/2018 at Unknown time  . ZYPREXA 15 MG tablet Take 15 mg by mouth at bedtime.   12/13/2018 at Unknown time    Patient Stressors: Financial difficulties Legal issue Medication change or noncompliance  Patient Strengths: Ability for insight Communication skills  Treatment Modalities: Medication Management, Group therapy, Case management,  1 to 1 session with clinician, Psychoeducation, Recreational therapy.   Physician Treatment Plan for Primary Diagnosis: Schizophrenia ([place]) Long Term Goal(s): Improvement in symptoms so as ready for discharge Improvement in symptoms so as ready for discharge   Short Term Goals: Ability to maintain clinical  measurements within normal limits will improve Compliance with prescribed medications will improve Ability to disclose and discuss suicidal ideas Ability to demonstrate self-control will improve  Medication Management: Evaluate patient's response, side effects, and tolerance of medication regimen.  Therapeutic Interventions: 1 to 1 sessions, Unit Group sessions and Medication administration.  Evaluation of Outcomes: Progressing  Physician Treatment Plan for Secondary Diagnosis: Principal Problem:   Schizophrenia ([place])  Long Term Goal(s): Improvement in symptoms so as ready for discharge Improvement in symptoms so as ready for discharge   Short Term Goals: Ability to maintain clinical measurements within normal limits will improve Compliance with prescribed medications will improve Ability to disclose and discuss suicidal ideas Ability to demonstrate self-control will improve     Medication Management: Evaluate patient's response, side effects, and tolerance of medication regimen.  Therapeutic Interventions: 1 to 1 sessions, Unit Group sessions and Medication administration.  Evaluation of Outcomes: Progressing   RN Treatment Plan for Primary Diagnosis: Schizophrenia ([place]) Long Term Goal(s): Knowledge of disease and therapeutic regimen to maintain health will improve  Short Term Goals: Ability to demonstrate self-control, Ability to participate in decision making will improve, Ability to verbalize feelings will improve and Ability to identify and develop effective coping behaviors will improve  Medication Management: RN will administer medications as ordered by provider, will assess and evaluate patient's response and provide education to patient for prescribed medication. RN will report any adverse and/or side effects to prescribing provider.  Therapeutic Interventions: 1 on 1 counseling sessions, Psychoeducation, Medication administration, Evaluate responses to treatment, Monitor  vital signs and CBGs as ordered, Perform/monitor CIWA, COWS, AIMS and Fall Risk screenings as ordered, Perform wound care treatments as ordered.  Evaluation of Outcomes: Progressing   LCSW Treatment Plan for Primary Diagnosis: Schizophrenia ([place]) Long Term Goal(s): Safe transition to appropriate next level of care at discharge, Engage patient in therapeutic group addressing interpersonal concerns.  Short Term Goals: Engage patient in aftercare planning with referrals and resources, Increase social support, Increase ability to appropriately verbalize feelings, Increase emotional regulation, Facilitate acceptance of mental health diagnosis and concerns and Increase skills for wellness and recovery  Therapeutic Interventions: Assess for all discharge needs, 1 to 1 time with Social worker, Explore available resources and support systems, Assess for adequacy in community support network, Educate family and significant other(s) on suicide prevention, Complete Psychosocial Assessment, Interpersonal group therapy.  Evaluation of Outcomes: Progressing   Progress in Treatment: Attending groups: Yes. Participating in groups: Yes. Taking medication as prescribed: Yes. Toleration medication: Yes. Family/Significant other contact made: Yes, individual(s) contacted:   completed with the patient's legal guardian.  Patient understands diagnosis: No.  Discussing patient identified problems/goals with staff: Yes. Medical problems stabilized or resolved: Yes. Denies suicidal/homicidal ideation: Yes. Issues/concerns per patient self-inventory: No. Other: none  New problem(s) identified: No, Describe:  none  New Short Term/Long Term Goal(s): detox, elimination of symptoms of psychosis, medication management for mood stabilization; elimination of SI thoughts; development of comprehensive mental wellness/sobriety plan.  Patient Goals:  "I just want to get to a place where I can live in the group home  successfully"  Discharge Plan or Barriers: Pt will follow up with his ACT team with [facility].  CSW is still assessing if patient can return to the group home. UPDATE 12/22/2018:  CSW staff will schedule for the patient's group home to come visit with the patient to determine if patient will be allowed to return to the home.  Once scheduled for discharge CSW staff will reach out to [facility] ACT team to verify when they will follow up with the patient. Update 12/26/2018-Group home owner will not allow the pt to return to the group home due to her having concerns of him wandering off and being a threat to himself. Pt ACT Team and CSW team are working to find a new group home. Legal guardian and care coordinator have been informed of the placement issue. Update 12/31/2018. Pt is pending placement with [facility] on Monday or Tuesday next week. Pts legal guardian, care coordinator, and ACT team made aware. Pt will follow up with Act team with [facility] at discharge.  Reason for Continuation of Hospitalization: placement  Estimated Length of Stay: 01/05/2019  Recreational Therapy: Patient: N/A Patient Goal: Patient will engage in groups without prompting or encouragement from LRT x3 group sessions within 5 recreation therapy group sessions  Attendees: Patient:  12/31/2018 9:38 AM  Physician: Dr. Weber Cooks, MD 12/31/2018 9:38 AM  Nursing:  12/31/2018 9:38 AM  RN Care Manager: 12/31/2018 9:38 AM  Social Worker: Evalina Field, LCSW 12/31/2018 9:38 AM  Recreational Therapist:  12/31/2018 9:38 AM  Other:  12/31/2018 9:38 AM  Other:  12/31/2018 9:38 AM  Other: 12/31/2018 9:38 AM    Scribe for Treatment Team: Mariann Laster Taj Nevins, LCSW 12/31/2018 9:38 AM

## 2018-12-31 NOTE — Plan of Care (Addendum)
Pt rates depression 7/10 and hopelessness 8/10. Pt denies anxiety, SI, HI  and AVH. Pt was educated on care plan and verbalizes understanding. Collier Bullock RN Problem: Education: Goal: Knowledge of [place] [facility] Education information/materials will improve Outcome: Progressing   Problem: Coping: Goal: Ability to verbalize frustrations and anger appropriately will improve Outcome: Progressing Goal: Ability to demonstrate self-control will improve Outcome: Progressing   Problem: Safety: Goal: Periods of time without injury will increase Outcome: Progressing

## 2018-12-31 NOTE — Plan of Care (Signed)
Patient is calm and cooperative needing redirection and encouragement ,compliant with medications , sleep is restful requiring 15 minutes safety checks , education is giving no distress.   Problem: Education: Goal: Knowledge of [place] [facility] Education information/materials will improve Outcome: Progressing   Problem: Coping: Goal: Ability to verbalize frustrations and anger appropriately will improve Outcome: Progressing Goal: Ability to demonstrate self-control will improve Outcome: Progressing   Problem: Safety: Goal: Periods of time without injury will increase Outcome: Progressing

## 2019-01-01 NOTE — Progress Notes (Signed)
[facility] MD Progress Note  01/01/2019 10:28 AM Peter Parsons  MRN:  [id number]  Subjective: Follow-up for this patient with schizophrenia.  Patient reports that he is feeling good today.  He states that he slept well last night and currently has no complaints.  Patient denies any suicidal or homicidal ideations and denies any hallucinations or delusional thoughts.  Patient also continues to state that he has not had any more thoughts of wanting to take his clothes off in front of people.  Principal Problem: Schizophrenia ([place]) Diagnosis: Principal Problem:   Schizophrenia ([place])  Total Time spent with patient: 20 minutes  Past Psychiatric History: Past psychiatric history of schizophrenia  Past Medical History:  Past Medical History:  Diagnosis Date  . Diabetes mellitus without complication ([place])   . Hypertension   . Schizophrenia ([place])    History reviewed. No pertinent surgical history. Family History: History reviewed. No pertinent family history. Family Psychiatric  History: See previous Social History:  Social History   Substance and Sexual Activity  Alcohol Use None     Social History   Substance and Sexual Activity  Drug Use Not on file    Social History   Socioeconomic History  . Marital status: Single    Spouse name: Not on file  . Number of children: Not on file  . Years of education: Not on file  . Highest education level: Not on file  Occupational History  . Not on file  Social Needs  . Financial resource strain: Not on file  . Food insecurity    Worry: Not on file    Inability: Not on file  . Transportation needs    Medical: Not on file    Non-medical: Not on file  Tobacco Use  . Smoking status: Never Smoker  . Smokeless tobacco: Never Used  Substance and Sexual Activity  . Alcohol use: Not on file  . Drug use: Not on file  . Sexual activity: Not on file  Lifestyle  . Physical activity    Days per week: Not on file    Minutes per session: Not on  file  . Stress: Not on file  Relationships  . Social Herbalist on phone: Not on file    Gets together: Not on file    Attends religious service: Not on file    Active member of club or organization: Not on file    Attends meetings of clubs or organizations: Not on file    Relationship status: Not on file  Other Topics Concern  . Not on file  Social History Narrative  . Not on file   Additional Social History:                         Sleep: Good  Appetite:  Fair  Current Medications: Current Facility-Administered Medications  Medication Dose Route Frequency Provider Last Rate Last Dose  . acetaminophen (TYLENOL) tablet 650 mg  650 mg Oral Q6H PRN Cristofano, Dorene Ar, MD      . alum & mag hydroxide-simeth (MAALOX/MYLANTA) 200-200-20 MG/5ML suspension 30 mL  30 mL Oral Q4H PRN Cristofano, Dorene Ar, MD   30 mL at 12/20/18 1222  . benztropine (COGENTIN) tablet 0.5 mg  0.5 mg Oral BID Clapacs, Madie Reno, MD   0.5 mg at 01/01/19 0746  . clonazePAM (KLONOPIN) tablet 0.5 mg  0.5 mg Oral BID Clapacs, Madie Reno, MD   0.5 mg at 01/01/19 0746  .  escitalopram (LEXAPRO) tablet 30 mg  30 mg Oral Daily Sharma Covert, MD   30 mg at 01/01/19 0746  . gabapentin (NEURONTIN) capsule 300 mg  300 mg Oral TID Clapacs, Madie Reno, MD   300 mg at 01/01/19 0747  . ibuprofen (ADVIL) tablet 600 mg  600 mg Oral Q6H PRN Caroline Sauger, NP   600 mg at 12/29/18 2043  . lisinopril (ZESTRIL) tablet 20 mg  20 mg Oral Daily Clapacs, Madie Reno, MD   20 mg at 01/01/19 0746  . loperamide (IMODIUM) capsule 2 mg  2 mg Oral PRN Clapacs, Madie Reno, MD   2 mg at 12/20/18 2142  . LORazepam (ATIVAN) tablet 1 mg  1 mg Oral Q4H PRN Cristofano, Dorene Ar, MD   1 mg at 01/01/19 0746  . magnesium hydroxide (MILK OF MAGNESIA) suspension 30 mL  30 mL Oral Daily PRN Cristofano, Paul A, MD      . metFORMIN (GLUCOPHAGE) tablet 500 mg  500 mg Oral BID WC Clapacs, John T, MD   500 mg at 01/01/19 0746  . nicotine (NICODERM CQ -  dosed in mg/24 hours) patch 14 mg  14 mg Transdermal Daily Clapacs, Madie Reno, MD   14 mg at 01/01/19 0750  . [START ON 01/14/2019] paliperidone (INVEGA SUSTENNA) injection 234 mg  234 mg Intramuscular Q28 days Money, Lowry Ram, FNP      . traZODone (DESYREL) tablet 100 mg  100 mg Oral QHS PRN Caroline Sauger, NP   100 mg at 12/31/18 2034    Lab Results: No results found for this or any previous visit (from the past 48 hour(s)).  Blood Alcohol level:  Lab Results  Component Value Date   ETH <10 99991111    Metabolic Disorder Labs: No results found for: HGBA1C, MPG No results found for: PROLACTIN Lab Results  Component Value Date   CHOL 110 12/16/2018   TRIG 77 12/16/2018   HDL 40 (L) 12/16/2018   CHOLHDL 2.8 12/16/2018   VLDL 15 12/16/2018   LDLCALC 55 12/16/2018    Physical Findings: AIMS: Facial and Oral Movements Muscles of Facial Expression: None, normal Lips and Perioral Area: None, normal Jaw: None, normal Tongue: None, normal,Extremity Movements Upper (arms, wrists, hands, fingers): None, normal Lower (legs, knees, ankles, toes): None, normal, Trunk Movements Neck, shoulders, hips: None, normal, Overall Severity Severity of abnormal movements (highest score from questions above): None, normal Incapacitation due to abnormal movements: None, normal Patient's awareness of abnormal movements (rate only patient's report): No Awareness, Dental Status Current problems with teeth and/or dentures?: No Does patient usually wear dentures?: No  CIWA:  CIWA-Ar Total: 3 COWS:  COWS Total Score: 2  Musculoskeletal: Strength & Muscle Tone: within normal limits Gait & Station: normal Patient leans: N/A  Psychiatric Specialty Exam: Physical Exam  Nursing note and vitals reviewed. Constitutional: He appears well-developed and well-nourished.  HENT:  Head: Normocephalic and atraumatic.  Eyes: Pupils are equal, round, and reactive to light. Conjunctivae are normal.  Neck:  Normal range of motion.  Cardiovascular: Regular rhythm and normal heart sounds.  Respiratory: Effort normal.  GI: Soft.  Musculoskeletal: Normal range of motion.  Neurological: He is alert.  Skin: Skin is warm and dry.  Psychiatric: Judgment normal. His affect is blunt. His speech is delayed. He is slowed. Thought content is not delusional. Cognition and memory are impaired. He expresses no homicidal and no suicidal ideation.    Review of Systems  Constitutional: Negative.   HENT: Negative.  Eyes: Negative.   Respiratory: Negative.   Cardiovascular: Negative.   Gastrointestinal: Negative.   Musculoskeletal: Negative.   Skin: Negative.   Neurological: Negative.   Psychiatric/Behavioral: Negative for depression, hallucinations, memory loss, substance abuse and suicidal ideas. The patient is not nervous/anxious and does not have insomnia.     Blood pressure 132/82, pulse 92, temperature 97.8 F (36.6 C), temperature source Oral, resp. rate 17, height 5\' 10"  (1.778 m), weight 90.3 kg, SpO2 97 %.Body mass index is 28.55 kg/m.  General Appearance: Casual  Eye Contact:  Good  Speech:  Clear and Coherent  Volume:  Normal  Mood:  Euthymic  Affect:  Constricted  Thought Process:  Coherent  Orientation:  Full (Time, Place, and Person)  Thought Content:  Logical  Suicidal Thoughts:  No  Homicidal Thoughts:  No  Memory:  Immediate;   Fair Recent;   Fair Remote;   Fair  Judgement:  Fair  Insight:  Fair  Psychomotor Activity:  Normal  Concentration:  Concentration: Fair  Recall:  [employer] of Knowledge:  Fair  Language:  Fair  Akathisia:  No  Handed:  Right  AIMS (if indicated):     Assets:  Desire for Improvement  ADL's:  Impaired  Cognition:  Impaired,  Mild  Sleep:  Number of Hours: 7.75   Assessment: Patient presents lying in the bed and is pleasant, calm, cooperative.  Patient continues to have a flat affect but has continued to deny any suicidal homicidal ideations and  denies any hallucinations.  Based on chart review patient has been compliant with medications as well as treatment but has been noticed to be sleeping a little later in the mornings.  Per morning been progression meeting it was discussed that patient is awaiting placement and is hoping that the patient's legal guardian will have placement for him by Monday or Tuesday.  Treatment Plan Summary: Daily contact with patient to assess and evaluate symptoms and progress in treatment and Medication management  Continue Cogentin 0.5 mg p.o. twice daily for EPS Continue Klonopin 0.5 mg p.o. twice daily for anxiety Continue Lexapro 30 mg p.o. daily for depression Continue Neurontin 300 mg p.o. 3 times daily Continue Invega Sustenna IM 234 mg every 28 days with next dose due on 01/14/2019 Continue trazodone 100 mg p.o. nightly as needed for insomnia Continue lisinopril 20 mg p.o. daily for hypertension and blood pressure is maintained within normal limits Continue metformin 500 mg p.o. twice daily for diabetes  Lewis Shock, FNP 01/01/2019, 10:28 AM

## 2019-01-01 NOTE — Plan of Care (Signed)
D: Pt during assessments denies SI/HI/AVH, able to verbally contract for safety. Pt is pleasant and cooperative, but overall minimal, forwarding little. Pt. Denies pain. Pt. Endorses doing better today, but still expressing moderate depression and anxiety, requesting PRN medicine for comfort. Pt. Affect mostly flat.   A: Q x 15 minute observation checks in place for safety. Patient was and is provided with education throughout shift.  Patient was and will be given/offered medications per orders. Patient was and is encouraged to attend groups, participate in unit activities and continue with plan of care. Pt. Chart and plans of care reviewed. Pt. Given support and encouragement.   R: Patient is complaint with medication and unit procedures. Pt. Observed eating fair. Pt. Mostly isolative and withdrawn to room resting today when not up for meals. No behavioral concerns to report.     Problem: Education: Goal: Knowledge of [place] [facility] Education information/materials will improve Outcome: Progressing   Problem: Coping: Goal: Ability to verbalize frustrations and anger appropriately will improve Outcome: Progressing Goal: Ability to demonstrate self-control will improve Outcome: Progressing   Problem: Safety: Goal: Periods of time without injury will increase Outcome: Progressing

## 2019-01-01 NOTE — Plan of Care (Signed)
Patient is pleasant upon approach and responding well to treatment regimen , denies any SI/HI/ANH, handling himself well , coping well  and doing better, patient was given PRN Trazodone to help with sleep which is restful  ,, education and support is provided 15 minutes safety checks is maintained no distress.   Problem: Education: Goal: Knowledge of [place] [facility] Education information/materials will improve Outcome: Progressing   Problem: Coping: Goal: Ability to verbalize frustrations and anger appropriately will improve Outcome: Progressing Goal: Ability to demonstrate self-control will improve Outcome: Progressing   Problem: Safety: Goal: Periods of time without injury will increase Outcome: Progressing

## 2019-01-01 NOTE — Progress Notes (Signed)
Patient at and around this time expressing increase in anxiety. PRN medicine given for comfort and safety. Will continue to monitor.

## 2019-01-01 NOTE — B[facility] Counselor (Signed)
CSW spoke with Vanna Scotland, care coordinator [phone].  CSW updated on: -Name of group home -ACT team, group home and legal guardian all have been contacted and in contact with one another -Expected discharge date is 01/05/2019 -Funds request will be made by guardian  Adia recommended that the group home be made aware of the elopement behaviors so that they can establish "good safety plans" in place in an effort to prevent this.  CSW informed that all CSW have spoke with the group home and informed of elopement, as well as the patient's ACT team.  Adia reports that she will reach out to the ACT team to encourage them to work with the possible group home to develop those safety plans.  Assunta Curtis, MSW, LCSW 01/01/2019 10:57 AM

## 2019-01-01 NOTE — Progress Notes (Signed)
CSW spoke with Joaquim Lai regarding group home placement who reported that she has not spoke to pts guardian regarding the financial piece and informed CSW to give pts [employer] phone number for the owner, Lucy Antigua [phone].   CSW spoke with pts legal guardian Lucio Edward who reported she did indeed speak with the group home yesterday regarding the financial piece and she explained that they would need to fax over how much pt would owe and they would send a funds request and get a check to them. Per [name] she is going to look at the group home on Monday and the group home is planning to pick pt up on Tuesday 01/05/2019.   Evalina Field, MSW, LCSW Clinical Social Work 01/01/2019 10:44 AM

## 2019-01-01 NOTE — Progress Notes (Signed)
Recreation Therapy Notes   Date: 01/01/2019  Time: 9:30 am   Location: Craft room   Behavioral response: N/A   Intervention Topic: Relaxation    Discussion/Intervention: Patient did not attend group.   Clinical Observations/Feedback:  Patient did not attend group.   Maysel Mccolm LRT/CTRS        Shaquetta Arcos 01/01/2019 10:53 AM

## 2019-01-01 NOTE — Progress Notes (Deleted)
   12/30/18 1640  PPD Results  Does patient have an induration at the injection site? No

## 2019-01-01 NOTE — B[facility] Counselor (Signed)
CSW attempted to reach Vanna Scotland, care coordinator [phone].  CSW was unable to speak with her and left a HIPAA compliant voicemail.  Assunta Curtis, MSW, LCSW 01/01/2019 10:34 AM

## 2019-01-01 NOTE — [facility] Group Notes (Signed)

## 2019-01-02 NOTE — Plan of Care (Signed)
  Problem: Education: Goal: Knowledge of [place] [facility] Education information/materials will improve Outcome: Progressing   Problem: Coping: Goal: Ability to verbalize frustrations and anger appropriately will improve Outcome: Progressing Goal: Ability to demonstrate self-control will improve Outcome: Progressing

## 2019-01-02 NOTE — Plan of Care (Signed)
  Problem: Coping: Goal: Ability to verbalize frustrations and anger appropriately will improve Outcome: Progressing  Patient verbalized frustrations about his thought process concerning taking cloth off, witer advised him to keep his clothes on.

## 2019-01-02 NOTE — Progress Notes (Signed)
Patient presents with flat affect. Slow movement. Isolative to self and room. Did ask for change of clothing due to soiled shirt. Minimal interaction with staff and peers. Medication compliant. Encouragement and support offered. Medications given as prescribed. Safety checks maintained. Pt receptive and remains safe on unit with q 15 min checks.

## 2019-01-02 NOTE — Progress Notes (Signed)
D: Patient has been calm and cooperative. Denies SI, HI and AVH A: Continue to monitor for safety R: Safety maintained. 

## 2019-01-02 NOTE — [facility] Group Notes (Signed)
LCSW Aftercare Discharge Planning Group Note   01/02/2019 100pm  Type of Group and Topic: Psychoeducational Group:  Discharge Planning  Participation Level:  Minimal  Description of Group  Discharge planning group reviews patient's anticipated discharge plans and assists patients to anticipate and address any barriers to wellness/recovery in the community.  Suicide prevention education is reviewed with patients in group.  Therapeutic Goals 1. Patients will state their anticipated discharge plan and mental health aftercare 2. Patients will identify potential barriers to wellness in the community setting 3. Patients will engage in problem solving, solution focused discussion of ways to anticipate and address barriers to wellness/recovery  Summary of Patient Progress: Pt mostly quiet during discussion, had difficulty following the flow of discussion but was attentive.  Pt did not make comments but did respond when asked questions by CSW.   Plan for Discharge/Comments:    Transportation Means:   Supports:  Therapeutic Modalities: Motivational Interviewing    Joanne Chars, LCSW 01/02/2019 2:27 PM

## 2019-01-02 NOTE — Plan of Care (Signed)
  Problem: Coping: Goal: Ability to verbalize frustrations and anger appropriately will improve Outcome: Progressing Goal: Ability to demonstrate self-control will improve Outcome: Progressing  D: Patient has been calm and cooperative. Denies SI, HI and AVH. A: Continue to monitor for safety R: Safety maintained.

## 2019-01-02 NOTE — Progress Notes (Signed)
Patient alert and oriented x 4, affect is flat , but he brightens upon approach, he appears less anxious,  interacting appropriately with peers and staff, no distress noted, he denies SI/HI/AVH, his thoughts are organized and coherent, he is complaint with medication regimen, 15 minutes safety checks maintained will continue to monitor.
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
## 2019-01-03 MED ORDER — CLONAZEPAM 1 MG PO TABS: 1.0000 mg | ORAL_TABLET | Freq: Two times a day (BID) | ORAL | Status: DC

## 2019-01-03 MED ORDER — TEMAZEPAM 15 MG PO CAPS: 30.0000 mg | ORAL_CAPSULE | Freq: Every evening | ORAL | Status: DC | PRN

## 2019-01-03 MED ORDER — CLONAZEPAM 0.5 MG PO TABS
0.5000 mg | ORAL_TABLET | Freq: Three times a day (TID) | ORAL | Status: DC
  Administered 2019-01-03 – 2019-01-04 (×3): 0.5 mg via ORAL
  Filled 2019-01-03 (×4): qty 1

## 2019-01-03 MED ORDER — RISPERIDONE 1 MG PO TABS
3.0000 mg | ORAL_TABLET | Freq: Two times a day (BID) | ORAL | Status: DC
  Administered 2019-01-03 – 2019-01-05 (×6): 3 mg via ORAL
  Filled 2019-01-03 (×6): qty 3

## 2019-01-03 MED ORDER — BENZTROPINE MESYLATE 1 MG PO TABS
1.0000 mg | ORAL_TABLET | Freq: Two times a day (BID) | ORAL | Status: DC
  Administered 2019-01-03 – 2019-01-05 (×5): 1 mg via ORAL
  Filled 2019-01-03 (×5): qty 1

## 2019-01-03 NOTE — Plan of Care (Signed)
Pt rates anxiety, depression, hopelessness all a 6/10 . Pt denies SI, HI and AVH. Pt was educated on care plan and verbalizes understanding. Collier Bullock RN Problem: Education: Goal: Knowledge of [place] [facility] Education information/materials will improve Outcome: Progressing   Problem: Coping: Goal: Ability to verbalize frustrations and anger appropriately will improve Outcome: Progressing Goal: Ability to demonstrate self-control will improve Outcome: Progressing   Problem: Safety: Goal: Periods of time without injury will increase Outcome: Progressing

## 2019-01-03 NOTE — BHH Group Notes (Signed)
LCSW Group Therapy Note 01/03/2019 1:15pm  Type of Therapy and Topic: Group Therapy: Feelings Around Returning Home & Establishing a Supportive Framework and Supporting Oneself When Supports Not Available  Participation Level: Active  Description of Group:  Patients first processed thoughts and feelings about upcoming discharge. These included fears of upcoming changes, lack of change, new living environments, judgements and expectations from others and overall stigma of mental health issues. The group then discussed the definition of a supportive framework, what that looks and feels like, and how do to discern it from an unhealthy non-supportive network. The group identified different types of supports as well as what to do when your family/friends are less than helpful or unavailable  Therapeutic Goals  1. Patient will identify one healthy supportive network that they can use at discharge. 2. Patient will identify one factor of a supportive framework and how to tell it from an unhealthy network. 3. Patient able to identify one coping skill to use when they do not have positive supports from others. 4. Patient will demonstrate ability to communicate their needs through discussion and/or role plays.  Summary of Patient Progress:  Pt reports he feels "so-so." Pt engaged during group session. As patients processed their anxiety about discharge and described healthy supports patient shared he is not ready to be discharge.  Patients identified at least one self-care tool they were willing to use after discharge.   Therapeutic Modalities Cognitive Behavioral Therapy Motivational Interviewing   Cheree Ditto, LCSW 01/03/2019 12:33 PM

## 2019-01-03 NOTE — Plan of Care (Signed)
Patient compliant with procedures on the unit  Problem: Coping: Goal: Ability to demonstrate self-control will improve Outcome: Progressing

## 2019-01-03 NOTE — Progress Notes (Signed)
Pt expressed having anxiety and paranoia today staing "I feel like the world is out to get me". I extended support and encouragement him is several ways. He agreed and followed through with advice. Pt has remained calm and cooperative today. Collier Bullock RN

## 2019-01-03 NOTE — Progress Notes (Signed)
D - Patient was in his room upon arrival to the unit. Patient was pleasant during assessment denying SI/HI/AVH and pain. Patient endorses anxiety and depression. Patient stated he was feeling better and is hopeful he can leave this week.  A - Patientcompliant with medication administration per MD orders. Patient given education. Patient given support and encouragement to be active in his treatment plan. Patient informed to let staff know if there are any issues or problems on the unit.   R - Patient being monitored Q 15 minutes for safety per unit protocol. Patient remains safe on the unit
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
## 2019-01-04 MED ORDER — CLONAZEPAM 0.5 MG PO TABS: 0.5000 mg | ORAL_TABLET | Freq: Three times a day (TID) | ORAL | Status: DC | PRN

## 2019-01-04 NOTE — Progress Notes (Signed)
Patient is unsteady on his feet. This Probation officer held patient's afternoon Klonopin. MD notified.

## 2019-01-04 NOTE — Plan of Care (Signed)
D- Patient alert and oriented. Patient presents in a pleasant mood on assessment stating that he slept ok last night and had no complaints to voice to this Probation officer. Patient endorsed depression and anxiety, rating them both a "3/10", stating that "being away from home during the holiday season" is why he's feeling this way. Patient denies SI, HI, AVH, and pain at this time. Patient's goal for today is "pray".  A- Scheduled medications administered to patient, per MD orders. Support and encouragement provided.  Routine safety checks conducted every 15 minutes.  Patient informed to notify staff with problems or concerns.  R- No adverse drug reactions noted. Patient contracts for safety at this time. Patient compliant with medications and treatment plan. Patient receptive, calm, and cooperative. Patient interacts well with others on the unit.  Patient remains safe at this time.  Problem: Education: Goal: Knowledge of  General Education information/materials will improve Outcome: Progressing   Problem: Coping: Goal: Ability to verbalize frustrations and anger appropriately will improve Outcome: Progressing Goal: Ability to demonstrate self-control will improve Outcome: Progressing   Problem: Safety: Goal: Periods of time without injury will increase Outcome: Progressing

## 2019-01-04 NOTE — Progress Notes (Signed)
[facility] MD Progress Note  01/04/2019 1:53 PM Peter Parsons  MRN:  [id number] Subjective: Patient is seen and examined.  Patient is a 58 year old male with a history of schizophrenia who was admitted on 12/16/2018 after running away from his group home.  Objective: Patient is seen and examined.  Patient is a 58 year old male with the above-stated past psychiatric history who is seen in follow-up.  I am somewhat familiar with this patient from having seen him a week or 2 ago when I was cross covering.  He is essentially unchanged.  He does seem a bit unsteady on his feet today.  He has complained of falls throughout the hospitalizations, but have had very few injuries.  He does have a walker, but at times refuses to use it.  He was unsteady on his feet today, the patient does have Klonopin 0.5 mg p.o. 3 times daily that was written on 01/03/2019.  This was held.  Social work stated that he has been accepted into a group home, and he is reportedly being able to be transported there tomorrow.  His only complaints are somatic in origin.  His vital signs are stable, he is afebrile.  He slept 8 hours last night.  Principal Problem: Schizophrenia ([place]) Diagnosis: Principal Problem:   Schizophrenia ([place])  Total Time spent with patient: 20 minutes  Past Psychiatric History: See admission H&P  Past Medical History:  Past Medical History:  Diagnosis Date  . Diabetes mellitus without complication ([place])   . Hypertension   . Schizophrenia ([place])    History reviewed. No pertinent surgical history. Family History: History reviewed. No pertinent family history. Family Psychiatric  History: See admission H&P Social History:  Social History   Substance and Sexual Activity  Alcohol Use None     Social History   Substance and Sexual Activity  Drug Use Not on file    Social History   Socioeconomic History  . Marital status: Single    Spouse name: Not on file  . Number of children: Not on file  . Years of  education: Not on file  . Highest education level: Not on file  Occupational History  . Not on file  Social Needs  . Financial resource strain: Not on file  . Food insecurity    Worry: Not on file    Inability: Not on file  . Transportation needs    Medical: Not on file    Non-medical: Not on file  Tobacco Use  . Smoking status: Never Smoker  . Smokeless tobacco: Never Used  Substance and Sexual Activity  . Alcohol use: Not on file  . Drug use: Not on file  . Sexual activity: Not on file  Lifestyle  . Physical activity    Days per week: Not on file    Minutes per session: Not on file  . Stress: Not on file  Relationships  . Social Herbalist on phone: Not on file    Gets together: Not on file    Attends religious service: Not on file    Active member of club or organization: Not on file    Attends meetings of clubs or organizations: Not on file    Relationship status: Not on file  Other Topics Concern  . Not on file  Social History Narrative  . Not on file   Additional Social History:  Sleep: Good  Appetite:  Good  Current Medications: Current Facility-Administered Medications  Medication Dose Route Frequency Provider Last Rate Last Dose  . acetaminophen (TYLENOL) tablet 650 mg  650 mg Oral Q6H PRN Cristofano, Dorene Ar, MD      . alum & mag hydroxide-simeth (MAALOX/MYLANTA) 200-200-20 MG/5ML suspension 30 mL  30 mL Oral Q4H PRN Cristofano, Dorene Ar, MD   30 mL at 12/20/18 1222  . benztropine (COGENTIN) tablet 1 mg  1 mg Oral BID Johnn Hai, MD   1 mg at 01/04/19 0820  . clonazePAM (KLONOPIN) tablet 0.5 mg  0.5 mg Oral TID Johnn Hai, MD   Stopped at 01/04/19 1209  . escitalopram (LEXAPRO) tablet 30 mg  30 mg Oral Daily Sharma Covert, MD   30 mg at 01/04/19 0820  . gabapentin (NEURONTIN) capsule 300 mg  300 mg Oral TID Clapacs, Madie Reno, MD   300 mg at 01/04/19 1209  . ibuprofen (ADVIL) tablet 600 mg  600 mg Oral Q6H  PRN Caroline Sauger, NP   600 mg at 01/03/19 1942  . lisinopril (ZESTRIL) tablet 20 mg  20 mg Oral Daily Clapacs, Madie Reno, MD   20 mg at 01/04/19 0820  . loperamide (IMODIUM) capsule 2 mg  2 mg Oral PRN Clapacs, Madie Reno, MD   2 mg at 12/20/18 2142  . LORazepam (ATIVAN) tablet 1 mg  1 mg Oral Q4H PRN Cristofano, Dorene Ar, MD   1 mg at 01/03/19 2123  . magnesium hydroxide (MILK OF MAGNESIA) suspension 30 mL  30 mL Oral Daily PRN Cristofano, Paul A, MD      . metFORMIN (GLUCOPHAGE) tablet 500 mg  500 mg Oral BID WC Clapacs, Madie Reno, MD   500 mg at 01/04/19 0820  . nicotine (NICODERM CQ - dosed in mg/24 hours) patch 14 mg  14 mg Transdermal Daily Clapacs, Madie Reno, MD   14 mg at 01/04/19 [id number]  . [START ON 01/14/2019] paliperidone (INVEGA SUSTENNA) injection 234 mg  234 mg Intramuscular Q28 days Money, Lowry Ram, FNP      . risperiDONE (RISPERDAL) tablet 3 mg  3 mg Oral BID Johnn Hai, MD   3 mg at 01/04/19 0820  . temazepam (RESTORIL) capsule 30 mg  30 mg Oral QHS PRN Johnn Hai, MD      . traZODone (DESYREL) tablet 100 mg  100 mg Oral QHS PRN Caroline Sauger, NP   100 mg at 01/03/19 2123    Lab Results: No results found for this or any previous visit (from the past 31 hour(s)).  Blood Alcohol level:  Lab Results  Component Value Date   ETH <10 99991111    Metabolic Disorder Labs: No results found for: HGBA1C, MPG No results found for: PROLACTIN Lab Results  Component Value Date   CHOL 110 12/16/2018   TRIG 77 12/16/2018   HDL 40 (L) 12/16/2018   CHOLHDL 2.8 12/16/2018   VLDL 15 12/16/2018   LDLCALC 55 12/16/2018    Physical Findings: AIMS: Facial and Oral Movements Muscles of Facial Expression: None, normal Lips and Perioral Area: None, normal Jaw: None, normal Tongue: None, normal,Extremity Movements Upper (arms, wrists, hands, fingers): None, normal Lower (legs, knees, ankles, toes): None, normal, Trunk Movements Neck, shoulders, hips: None, normal, Overall  Severity Severity of abnormal movements (highest score from questions above): None, normal Incapacitation due to abnormal movements: None, normal Patient's awareness of abnormal movements (rate only patient's report): No Awareness, Dental Status Current problems with teeth and/or  dentures?: No Does patient usually wear dentures?: No  CIWA:  CIWA-Ar Total: 3 COWS:  COWS Total Score: 2  Musculoskeletal: Strength & Muscle Tone: within normal limits Gait & Station: shuffle Patient leans: N/A  Psychiatric Specialty Exam: Physical Exam  Nursing note and vitals reviewed. Constitutional: He is oriented to person, place, and time. He appears well-developed and well-nourished.  HENT:  Head: Normocephalic and atraumatic.  Respiratory: Effort normal.  Neurological: He is alert and oriented to person, place, and time.    ROS  Blood pressure 100/66, pulse 66, temperature 98.6 F (37 C), temperature source Oral, resp. rate 16, height 5\' 10"  (1.778 m), weight 90.3 kg, SpO2 94 %.Body mass index is 28.55 kg/m.  General Appearance: Casual  Eye Contact:  Fair  Speech:  Normal Rate  Volume:  Decreased  Mood:  Anxious  Affect:  Congruent  Thought Process:  Coherent and Descriptions of Associations: Circumstantial  Orientation:  Full (Time, Place, and Person)  Thought Content:  Delusions and Paranoid Ideation  Suicidal Thoughts:  No  Homicidal Thoughts:  No  Memory:  Immediate;   Fair Recent;   Fair Remote;   Fair  Judgement:  Impaired  Insight:  Lacking  Psychomotor Activity:  Decreased  Concentration:  Concentration: Fair and Attention Span: Fair  Recall:  [employer] of Knowledge:  Fair  Language:  Fair  Akathisia:  Negative  Handed:  Right  AIMS (if indicated):     Assets:  Desire for Improvement Resilience  ADL's:  Intact  Cognition:  WNL  Sleep:  Number of Hours: 8     Treatment Plan Summary: Daily contact with patient to assess and evaluate symptoms and progress in  treatment, Medication management and Plan : Patient is seen and examined.  Patient is a 58 year old male with the above-stated past psychiatric history who is seen in follow-up.   Diagnosis: #1 schizophrenia  Patient is seen in follow-up.  He is thought to be pretty much near his baseline.  No changes medications today.  We will plan on discharge to the group home tomorrow. 1.  Continue Cogentin 1 mg p.o. twice daily for side effects of medications. 2.  Change Klonopin 0.5 mg p.o. 3 times daily to as needed only for anxiety. 3.  No change in Lexapro 30 mg p.o. daily for anxiety and depression. 4.  Continue gabapentin 300 mg p.o. 3 times daily for anxiety. 5.  Continue Zestril 20 mg p.o. daily for hypertension. 6.  Stop lorazepam. 7.  Continue Metformin 500 mg p.o. twice daily for diabetes mellitus. 8.  Continue paliperidone long-acting injection 234 mg to be given on 01/14/2019.  This is for schizophrenia. 9.  Continue Risperdal 3 mg p.o. twice daily for schizophrenia. 10.  Continue temazepam 30 mg p.o. nightly as needed insomnia. 11.  Continue trazodone 100 mg p.o. nightly as needed insomnia. 12.  Disposition planning hopefully to group home tomorrow.  Sharma Covert, MD 01/04/2019, 1:53 PM

## 2019-01-04 NOTE — [facility] Group Notes (Signed)

## 2019-01-04 NOTE — BHH Counselor (Signed)
CSW spoke with Stanton Kidney Woods([facility] owner) who reports she will pick up the patient on 11/24 at 1pm.

## 2019-01-04 NOTE — BHH Counselor (Signed)
CSW left confidential vm for pt ACT Team(Craig), legal [facility]) informing them of discharge  plan. CSW spoke with care coordinator(Adia Quentin Cornwall) and informed her of discharge plan scheduled for 11/24.

## 2019-01-04 NOTE — Progress Notes (Signed)
This Probation officer was informed that patient had fallen in his room around [place]. Patient's fall was unwitnessed and patient was found on the floor in his room. Staff helped patient up onto the bed and his vitals were taken and were unremarkable: HR: 121, R: 18, BP: 131/88, O2: 96. Patient denied any pain and there were no injuries noted. While staff was obtaining patient vitals, he stated that he needed to go use the bathroom and jumped right up and went to the restroom. Patient was then seen walking down the hall to the dayroom to get breakfast. Patient remains safe on the unit and will continue to be monitored.

## 2019-01-04 NOTE — Progress Notes (Signed)
Patient's evening medication was held because he is asleep and has been for a while. Patient even slept through dinner.

## 2019-01-04 NOTE — Progress Notes (Signed)
Recreation Therapy Notes  Date: 01/04/2019  Time: 9:30 am   Location: Craft room   Behavioral response: N/A   Intervention Topic: Anger Management    Discussion/Intervention: Patient did not attend group.   Clinical Observations/Feedback:  Patient did not attend group.   Irfan Veal LRT/CTRS        [place]tario Pettengill 01/04/2019 11:29 AM

## 2019-01-05 MED ORDER — PALIPERIDONE PALMITATE ER 234 MG/1.5ML IM SUSY: 234.0000 mg | PREFILLED_SYRINGE | INTRAMUSCULAR | 0 refills | Status: DC

## 2019-01-05 MED ORDER — NICOTINE 14 MG/24HR TD PT24: 14.0000 mg | MEDICATED_PATCH | Freq: Every day | TRANSDERMAL | 0 refills | Status: DC

## 2019-01-05 MED ORDER — METFORMIN HCL 500 MG PO TABS: 500.0000 mg | ORAL_TABLET | Freq: Two times a day (BID) | ORAL | 0 refills | Status: DC

## 2019-01-05 MED ORDER — BENZTROPINE MESYLATE 1 MG PO TABS: 1.0000 mg | ORAL_TABLET | Freq: Two times a day (BID) | ORAL | 0 refills | Status: DC

## 2019-01-05 MED ORDER — LISINOPRIL 20 MG PO TABS: 20.0000 mg | ORAL_TABLET | Freq: Every day | ORAL | 0 refills | Status: DC

## 2019-01-05 MED ORDER — CLONAZEPAM 0.5 MG PO TABS: 0.5000 mg | ORAL_TABLET | Freq: Three times a day (TID) | ORAL | 0 refills | Status: DC | PRN

## 2019-01-05 MED ORDER — ESCITALOPRAM OXALATE 10 MG PO TABS: 30.0000 mg | ORAL_TABLET | Freq: Every day | ORAL | 0 refills | Status: DC

## 2019-01-05 MED ORDER — RISPERIDONE 3 MG PO TABS: 3.0000 mg | ORAL_TABLET | Freq: Two times a day (BID) | ORAL | 0 refills | Status: DC

## 2019-01-05 NOTE — Progress Notes (Signed)
  [facility] Adult Case Management Discharge Plan :  Will you be returning to the same living situation after discharge:  No. pt will be going to [facility] group home #[facility] At discharge, do you have transportation home?: Yes,  group home will pick up at 4pm Do you have the ability to pay for your medications: Yes,  Medicaid  Release of information consent forms completed and in the chart;  Patient's signature needed at discharge.  Patient to Follow up at: Follow-up Information    [facility] Follow up.   Why: Your ACT Team will follow up with you on 01/06/2019 at 1:00 PM Contact information: [place]  [id number] [phone]           Next level of care provider has access to [place] and Suicide Prevention discussed: Yes,  Lucio Edward, legal guardian  Have you used any form of tobacco in the last 30 days? (Cigarettes, Smokeless Tobacco, Cigars, and/or Pipes): No  Has patient been referred to the Quitline?: N/A patient is not a smoker  Patient has been referred for addiction treatment: N/A  Yvette Rack, LCSW 01/05/2019, 1:04 PM

## 2019-01-05 NOTE — Progress Notes (Signed)
Patient alert and oriented x 4, affect is flat, he brightens upon approach, he appears less anxious, not interacting with peers he was isolate , no distress noted, he denies SI/HI/AVH, his thoughts are organized and coherent, he is complaint with medication regimen, 15 minutes safety checks maintained will continue to monitor

## 2019-01-05 NOTE — Discharge Summary (Signed)
Physician Discharge Summary Note  Patient:  Peter Parsons is an 58 y.o., male MRN:  [id number] DOB:  August 05, 1960 Patient phone:  [phone] (home)  Patient address:   [place] [address],  Total Time spent with patient: 45 minutes  Date of Admission:  12/15/2018 Date of Discharge: 01/05/2019  Reason for Admission:  HPI:  Patient seen and chart reviewed.  This is a 58 year old man with a history of schizophrenia who was brought to the hospital after running away from his group home.  As he tells the story it sounds like he ran away from the group home twice within a few days.  He is only been there for a little over a week.  He ran away and thought about killing himself by jumping off a bridge.  He had in an abandoned building but was found by police and taken back to the group home but then ran away again.  Patient describes his mood is anxious and sad.  He had lived with his parents all of his life until placement in this group home which happened just after he had been released from jail.  He was in jail on a charge of assault on a woman, specifically his mother, but he denies having done it.  Patient admits to ongoing auditory hallucinations.  Anxious mood.  Has some paranoid psychotic hyper religious thoughts.  On the other hand he recognizes he has a mental health problem and is asking for antipsychotic medicine.  Denies alcohol or drug abuse.  Denies any current thought of harming himself or anyone else. Associated Signs/Symptoms: Depression Symptoms:  depressed mood, psychomotor retardation, feelings of worthlessness/guilt, hopelessness, suicidal thoughts without plan, (Hypo) Manic Symptoms:  None reported Anxiety Symptoms:  Excessive Worry, Psychotic Symptoms:  Hallucinations: Auditory Paranoia, PTSD Symptoms: Negative  Past Psychiatric History: Patient has a long history of schizophrenia.  Multiple prior hospitalizations.  Multiple medications.  He tells me  that the long-acting Invega injection he had been receiving in the past was most effective.  For many years he had lived with his parents but more recently had been in jail and now on release is being placed in a group home for the first time in his life.  He admits to having had past suicide attempts but the last time he said was several years ago.  He denies ever having been violent.  Principal Problem: Schizophrenia [facility]) Discharge Diagnoses: Principal Problem:   Schizophrenia [facility])   Past Medical History:  Past Medical History:  Diagnosis Date  . Diabetes mellitus without complication ([place])   . Hypertension   . Schizophrenia ([place])    History reviewed. No pertinent surgical history. Family History: History reviewed. No pertinent family history. Family Psychiatric  History: Denies Social History:  Social History   Substance and Sexual Activity  Alcohol Use None     Social History   Substance and Sexual Activity  Drug Use Not on file    Social History   Socioeconomic History  . Marital status: Single    Spouse name: Not on file  . Number of children: Not on file  . Years of education: Not on file  . Highest education level: Not on file  Occupational History  . Not on file  Social Needs  . Financial resource strain: Not on file  . Food insecurity    Worry: Not on file    Inability: Not on file  . Transportation needs    Medical: Not on file  Non-medical: Not on file  Tobacco Use  . Smoking status: Never Smoker  . Smokeless tobacco: Never Used  Substance and Sexual Activity  . Alcohol use: Not on file  . Drug use: Not on file  . Sexual activity: Not on file  Lifestyle  . Physical activity    Days per week: Not on file    Minutes per session: Not on file  . Stress: Not on file  Relationships  . Social Herbalist on phone: Not on file    Gets together: Not on file    Attends religious service: Not on file    Active member of club or organization:  Not on file    Attends meetings of clubs or organizations: Not on file    Relationship status: Not on file  Other Topics Concern  . Not on file  Social History Narrative  . Not on file    Hospital Course:  Peter Parsons was admitted for Schizophrenia [facility]) and crisis management. He was treated with the following medications Tegretol, Gabapentin, Lexapro and Clonazepam prn.  Peter Parsons was discharged with current medication and was instructed on how to take medications as prescribed; (details listed below under Medication List).  Medical problems were identified and treated as needed.  Home medications were restarted as appropriate. He was also started on Lisinopril 20mg  po daily for elevated blood pressure. Patients admission was complicated by multiple psychosocial complaints and housing problems in addition to his mental illness. His length of stay exceeded 20 days inpatient.   Improvement was monitored by observation and Peter Parsons daily report of symptom reduction.  Emotional and mental status was monitored by daily self-inventory reports completed by Peter Parsons and clinical staff.         Peter Parsons was evaluated by the treatment team for stability and plans for continued recovery upon discharge.  Peter Parsons motivation was an integral factor for scheduling further treatment.  Employment, transportation, bed availability, health status, family support, and any pending legal issues were also considered during his hospital stay.  He was offered further treatment options upon discharge including but not limited to Residential, Intensive Outpatient, and Outpatient treatment.  Peter Parsons will follow up with the services as listed below under Follow Up Information.     Upon completion of this admission the Peter Parsons was both mentally and medically stable for discharge denying suicidal/homicidal ideation, auditory/visual/tactile hallucinations, delusional thoughts and paranoia.       Physical Findings: AIMS: Facial and Oral Movements Muscles of Facial Expression: None, normal Lips and Perioral Area: None, normal Jaw: None, normal Tongue: None, normal,Extremity Movements Upper (arms, wrists, hands, fingers): None, normal Lower (legs, knees, ankles, toes): None, normal, Trunk Movements Neck, shoulders, hips: None, normal, Overall Severity Severity of abnormal movements (highest score from questions above): None, normal Incapacitation due to abnormal movements: None, normal Patient's awareness of abnormal movements (rate only patient's report): No Awareness, Dental Status Current problems with teeth and/or dentures?: No Does patient usually wear dentures?: No  CIWA:  CIWA-Ar Total: 3 COWS:  COWS Total Score: 2  Musculoskeletal: Strength & Muscle Tone: within normal limits Gait & Station: normal Patient leans: N/A  Psychiatric Specialty Exam: See MD SRA Physical Exam  ROS See SRA completed by Attending Physician  Blood pressure 113/78, pulse 97, temperature 98.6 F (37 C), temperature source Oral, resp. rate 17, height 5\' 10"  (1.778 m), weight 90.3 kg, SpO2 95 %.Body mass index is 28.55 kg/m.  Sleep:  Number of Hours: 7.5     Have you used any form of tobacco in the last 30 days? (Cigarettes, Smokeless Tobacco, Cigars, and/or Pipes): No  Has this patient used any form of tobacco in the last 30 days? (Cigarettes, Smokeless Tobacco, Cigars, and/or Pipes) Yes, No  Blood Alcohol level:  Lab Results  Component Value Date   ETH <10 99991111    Metabolic Disorder Labs:  No results found for: HGBA1C, MPG No results found for: PROLACTIN Lab Results  Component Value Date   CHOL 110 12/16/2018   TRIG 77 12/16/2018   HDL 40 (L) 12/16/2018   CHOLHDL 2.8 12/16/2018   VLDL 15 12/16/2018   LDLCALC 55 12/16/2018    See Psychiatric Specialty Exam and Suicide Risk Assessment completed by Attending Physician prior to discharge.  Discharge destination:   Home  Is patient on multiple antipsychotic therapies at discharge:  No   Has Patient had three or more failed trials of antipsychotic monotherapy by history:  No  Recommended Plan for Multiple Antipsychotic Therapies: NA  Discharge Instructions    Discharge instructions   Complete by: As directed    Please continue to take medications as directed. If your symptoms return, worsen, or persist please call your 911, report to local ER, or contact crisis hotline. Please do not drink alcohol or use any illegal substances while taking prescription medications.   Please not change in medications. Several of your medications have been adjusted, discontinued or had the dose reduced.     Allergies as of 01/05/2019      Reactions   Acetaminophen Other (See Comments)   Unknown Unable to breathe Chest  tightness/SOB      Medication List    STOP taking these medications   buPROPion 150 MG 24 hr tablet Commonly known as: WELLBUTRIN XL   lisinopril-hydrochlorothiazide 20-25 MG tablet Commonly known as: ZESTORETIC   paliperidone 6 MG 24 hr tablet Commonly known as: INVEGA   prazosin 1 MG capsule Commonly known as: MINIPRESS   risperiDONE microspheres 25 MG injection Commonly known as: RISPERDAL CONSTA   ZyPREXA 15 MG tablet Generic drug: OLANZapine     TAKE these medications     Indication  benztropine 1 MG tablet Commonly known as: COGENTIN Take 1 tablet (1 mg total) by mouth 2 (two) times daily.  Indication: Extrapyramidal Reaction caused by Medications   clonazePAM 0.5 MG tablet Commonly known as: KLONOPIN Take 1 tablet (0.5 mg total) by mouth 3 (three) times daily as needed (anxiety). What changed:   medication strength  how much to take  when to take this  reasons to take this  Indication: Tardive Dyskinesia   escitalopram 10 MG tablet Commonly known as: LEXAPRO Take 3 tablets (30 mg total) by mouth daily. Start taking on: January 06, 2019  Indication: Major  Depressive Disorder   gabapentin 300 MG capsule Commonly known as: NEURONTIN Take 300 mg by mouth 3 (three) times daily.  Indication: Disease of the Peripheral Nerves, Restless Leg Syndrome   lisinopril 20 MG tablet Commonly known as: ZESTRIL Take 1 tablet (20 mg total) by mouth daily. Start taking on: January 06, 2019  Indication: High Blood Pressure Disorder   metFORMIN 500 MG tablet Commonly known as: GLUCOPHAGE Take 1 tablet (500 mg total) by mouth 2 (two) times daily with a meal. What changed:   medication strength  how much to take  when to take this  Indication: Antipsychotic Therapy-Induced Weight Gain, Type 2 Diabetes   nicotine  14 mg/24hr patch Commonly known as: NICODERM CQ - dosed in mg/24 hours Place 1 patch (14 mg total) onto the skin daily. Start taking on: January 06, 2019  Indication: Nicotine Addiction   paliperidone 234 MG/1.5ML Susy injection Commonly known as: INVEGA SUSTENNA Inject 234 mg into the muscle every 28 (twenty-eight) days. Start taking on: January 14, 2019  Indication: Schizophrenia   risperiDONE 3 MG tablet Commonly known as: RISPERDAL Take 1 tablet (3 mg total) by mouth 2 (two) times daily.  Indication: Schizophrenia   simvastatin 20 MG tablet Commonly known as: ZOCOR Take 20 mg by mouth daily.  Indication: High Amount of Fats in the Blood   tamsulosin 0.4 MG Caps capsule Commonly known as: FLOMAX Take 0.4 mg by mouth daily.  Indication: Benign Enlargement of Prostate, Dysfunction of the Urinary Bladder   traZODone 150 MG tablet Commonly known as: DESYREL Take 150 mg by mouth at bedtime as needed.  Indication: Trouble Sleeping      Follow-up Information    [facility] Follow up.   Why: Your ACT Team will follow up with you on Contact information: [place] [address] [phone]           Follow-up recommendations:  Activity:  Increase activity as tolerated Diet:   Routine diet as directed by outpatient provider Tests:  Routine labs as directed by outpatient provider. Currently on multiple antipsychotics with a history of diabetes, recommend CBC, CMP, A1C, Lipid panel and TSH every 3 months. Other:  Your next injection is due on 01/14/2019. Continue current medications as directed. Keep taking even if you begin to feel better.    Signed: Suella Broad, FNP 01/05/2019, 10:57 AM

## 2019-01-05 NOTE — Tx Team (Signed)
Interdisciplinary Treatment and Diagnostic Plan Update  01/05/2019 Time of Session: 830AM Aum Teer MRN: [id number]  Principal Diagnosis: Schizophrenia [facility])  Secondary Diagnoses: Principal Problem:   Schizophrenia ([place])   Current Medications:  Current Facility-Administered Medications  Medication Dose Route Frequency Provider Last Rate Last Dose  . acetaminophen (TYLENOL) tablet 650 mg  650 mg Oral Q6H PRN Cristofano, Dorene Ar, MD      . alum & mag hydroxide-simeth (MAALOX/MYLANTA) 200-200-20 MG/5ML suspension 30 mL  30 mL Oral Q4H PRN Cristofano, Dorene Ar, MD   30 mL at 12/20/18 1222  . benztropine (COGENTIN) tablet 1 mg  1 mg Oral BID Johnn Hai, MD   1 mg at 01/05/19 1605  . clonazePAM (KLONOPIN) tablet 0.5 mg  0.5 mg Oral TID PRN Sharma Covert, MD      . escitalopram (LEXAPRO) tablet 30 mg  30 mg Oral Daily Sharma Covert, MD   30 mg at 01/05/19 0900  . gabapentin (NEURONTIN) capsule 300 mg  300 mg Oral TID Clapacs, Madie Reno, MD   300 mg at 01/05/19 1606  . ibuprofen (ADVIL) tablet 600 mg  600 mg Oral Q6H PRN Caroline Sauger, NP   600 mg at 01/03/19 1942  . lisinopril (ZESTRIL) tablet 20 mg  20 mg Oral Daily Clapacs, John T, MD   20 mg at 01/05/19 0900  . loperamide (IMODIUM) capsule 2 mg  2 mg Oral PRN Clapacs, Madie Reno, MD   2 mg at 12/20/18 2142  . magnesium hydroxide (MILK OF MAGNESIA) suspension 30 mL  30 mL Oral Daily PRN Cristofano, Paul A, MD      . metFORMIN (GLUCOPHAGE) tablet 500 mg  500 mg Oral BID WC Clapacs, John T, MD   500 mg at 01/05/19 1606  . nicotine (NICODERM CQ - dosed in mg/24 hours) patch 14 mg  14 mg Transdermal Daily Clapacs, Madie Reno, MD   14 mg at 01/05/19 0902  . [START ON 01/14/2019] paliperidone (INVEGA SUSTENNA) injection 234 mg  234 mg Intramuscular Q28 days Money, Lowry Ram, FNP      . risperiDONE (RISPERDAL) tablet 3 mg  3 mg Oral BID Johnn Hai, MD   3 mg at 01/05/19 1605  . temazepam (RESTORIL) capsule 30 mg  30 mg Oral QHS PRN Johnn Hai, MD      . traZODone (DESYREL) tablet 100 mg  100 mg Oral QHS PRN Caroline Sauger, NP   100 mg at 01/03/19 2123   PTA Medications: Medications Prior to Admission  Medication Sig Dispense Refill Last Dose  . buPROPion (WELLBUTRIN XL) 150 MG 24 hr tablet Take 150 mg by mouth daily.   12/14/2018 at 0800  . gabapentin (NEURONTIN) 300 MG capsule Take 300 mg by mouth 3 (three) times daily.   12/14/2018 at 0800  . KLONOPIN 1 MG tablet Take 1 mg by mouth 3 (three) times daily.   12/14/2018 at 0800  . lisinopril-hydrochlorothiazide (ZESTORETIC) 20-25 MG tablet Take 1 tablet by mouth daily.   12/14/2018 at 0800  . metFORMIN (GLUCOPHAGE) 1000 MG tablet Take 1,000 mg by mouth 2 (two) times daily.   12/14/2018 at 0800  . paliperidone (INVEGA) 6 MG 24 hr tablet Take 6 mg by mouth daily.   12/14/2018 at 0800  . prazosin (MINIPRESS) 1 MG capsule Take 1 mg by mouth at bedtime.   Past Week at Unknown time  . risperiDONE microspheres (RISPERDAL CONSTA) 25 MG injection Inject 25 mg into the muscle every 14 (fourteen) days.  12/13/2018 at Unknown time  . simvastatin (ZOCOR) 20 MG tablet Take 20 mg by mouth daily.   12/14/2018 at 0800  . tamsulosin (FLOMAX) 0.4 MG CAPS capsule Take 0.4 mg by mouth daily.   12/14/2018 at 0800  . traZODone (DESYREL) 150 MG tablet Take 150 mg by mouth at bedtime as needed.   12/13/2018 at Unknown time  . ZYPREXA 15 MG tablet Take 15 mg by mouth at bedtime.   12/13/2018 at Unknown time    Patient Stressors: Financial difficulties Legal issue Medication change or noncompliance  Patient Strengths: Ability for insight Communication skills  Treatment Modalities: Medication Management, Group therapy, Case management,  1 to 1 session with clinician, Psychoeducation, Recreational therapy.   Physician Treatment Plan for Primary Diagnosis: Schizophrenia ([place]) Long Term Goal(s): Improvement in symptoms so as ready for discharge Improvement in symptoms so as ready for discharge    Short Term Goals: Ability to maintain clinical measurements within normal limits will improve Compliance with prescribed medications will improve Ability to disclose and discuss suicidal ideas Ability to demonstrate self-control will improve  Medication Management: Evaluate patient's response, side effects, and tolerance of medication regimen.  Therapeutic Interventions: 1 to 1 sessions, Unit Group sessions and Medication administration.  Evaluation of Outcomes: Adequate for Discharge  Physician Treatment Plan for Secondary Diagnosis: Principal Problem:   Schizophrenia ([place])  Long Term Goal(s): Improvement in symptoms so as ready for discharge Improvement in symptoms so as ready for discharge   Short Term Goals: Ability to maintain clinical measurements within normal limits will improve Compliance with prescribed medications will improve Ability to disclose and discuss suicidal ideas Ability to demonstrate self-control will improve     Medication Management: Evaluate patient's response, side effects, and tolerance of medication regimen.  Therapeutic Interventions: 1 to 1 sessions, Unit Group sessions and Medication administration.  Evaluation of Outcomes: Adequate for Discharge   RN Treatment Plan for Primary Diagnosis: Schizophrenia ([place]) Long Term Goal(s): Knowledge of disease and therapeutic regimen to maintain health will improve  Short Term Goals: Ability to demonstrate self-control, Ability to participate in decision making will improve, Ability to verbalize feelings will improve and Ability to identify and develop effective coping behaviors will improve  Medication Management: RN will administer medications as ordered by provider, will assess and evaluate patient's response and provide education to patient for prescribed medication. RN will report any adverse and/or side effects to prescribing provider.  Therapeutic Interventions: 1 on 1 counseling sessions,  Psychoeducation, Medication administration, Evaluate responses to treatment, Monitor vital signs and CBGs as ordered, Perform/monitor CIWA, COWS, AIMS and Fall Risk screenings as ordered, Perform wound care treatments as ordered.  Evaluation of Outcomes: Adequate for Discharge   LCSW Treatment Plan for Primary Diagnosis: Schizophrenia [facility]) Long Term Goal(s): Safe transition to appropriate next level of care at discharge, Engage patient in therapeutic group addressing interpersonal concerns.  Short Term Goals: Engage patient in aftercare planning with referrals and resources, Increase social support, Increase ability to appropriately verbalize feelings, Increase emotional regulation, Facilitate acceptance of mental health diagnosis and concerns and Increase skills for wellness and recovery  Therapeutic Interventions: Assess for all discharge needs, 1 to 1 time with Social worker, Explore available resources and support systems, Assess for adequacy in community support network, Educate family and significant other(s) on suicide prevention, Complete Psychosocial Assessment, Interpersonal group therapy.  Evaluation of Outcomes: Adequate for Discharge   Progress in Treatment: Attending groups: Yes. Participating in groups: Yes. Taking medication as prescribed: Yes. Toleration medication:  Yes. Family/Significant other contact made: Yes, individual(s) contacted:   completed with the patient's legal guardian.  Patient understands diagnosis: No. Discussing patient identified problems/goals with staff: Yes. Medical problems stabilized or resolved: Yes. Denies suicidal/homicidal ideation: Yes. Issues/concerns per patient self-inventory: No. Other: none  New problem(s) identified: No, Describe:  none  New Short Term/Long Term Goal(s): detox, elimination of symptoms of psychosis, medication management for mood stabilization; elimination of SI thoughts; development of comprehensive mental  wellness/sobriety plan.  Patient Goals:  "I just want to get to a place where I can live in the group home successfully"  Discharge Plan or Barriers: Pt will follow up with his ACT team with [facility].  CSW is still assessing if patient can return to the group home. UPDATE 12/22/2018:  CSW staff will schedule for the patient's group home to come visit with the patient to determine if patient will be allowed to return to the home.  Once scheduled for discharge CSW staff will reach out to [facility] ACT team to verify when they will follow up with the patient. Update 12/26/2018-Group home owner will not allow the pt to return to the group home due to her having concerns of him wandering off and being a threat to himself. Pt ACT Team and CSW team are working to find a new group home. Legal guardian and care coordinator have been informed of the placement issue. Update 12/31/2018. Pt is pending placement with [facility] on Monday or Tuesday next week. Pts legal guardian, care coordinator, and ACT team made aware. Pt will follow up with Act team with [facility] at discharge.Update 01/05/19-Pt is scheduled to discharge today and go to [facility] group home in [place]. Pt's ACT Team scheduled to follow up with him on 01/06/19.  Reason for Continuation of Hospitalization: D/C 01/05/2019 Estimated Length of Stay: D/C 01/05/2019 Recreational Therapy: Patient: N/A Patient Goal: Patient will engage in groups without prompting or encouragement from LRT x3 group sessions within 5 recreation therapy group sessions  Attendees: Patient:  01/05/2019 4:24 PM  Physician:Greg Clary 01/05/2019 4:24 PM  Nursing: Geraldo Pitter 01/05/2019 4:24 PM  RN Care Manager: 01/05/2019 4:24 PM  Social Worker: Anise Salvo 01/05/2019 4:24 PM  Recreational Therapist:  01/05/2019 4:24 PM  Other:  01/05/2019 4:24 PM  Other:  01/05/2019 4:24 PM  Other: 01/05/2019 4:24 PM    Scribe  for Treatment Team: Yvette Rack, LCSW 01/05/2019 4:24 PM

## 2019-01-05 NOTE — BHH Counselor (Signed)
CSW spoke with Lucy Antigua, owner of group home and Manon Hilding, transportation for the group home.  Both confirmed that the patient would be picked up today, 01/05/2019 at 4:00pm.  Assunta Curtis, MSW, LCSW 01/05/2019 9:44 AM

## 2019-01-05 NOTE — BHH Group Notes (Signed)
LCSW Group Therapy Note  01/05/2019 1:00 PM  Type of Therapy/Topic:  Group Therapy:  Feelings about Diagnosis  Participation Level:  None   Description of Group:   This group will allow patients to explore their thoughts and feelings about diagnoses they have received. Patients will be guided to explore their level of understanding and acceptance of these diagnoses. Facilitator will encourage patients to process their thoughts and feelings about the reactions of others to their diagnosis and will guide patients in identifying ways to discuss their diagnosis with significant others in their lives. This group will be process-oriented, with patients participating in exploration of their own experiences, giving and receiving support, and processing challenge from other group members.   Therapeutic Goals: 1. Patient will demonstrate understanding of diagnosis as evidenced by identifying two or more symptoms of the disorder 2. Patient will be able to express two feelings regarding the diagnosis 3. Patient will demonstrate their ability to communicate their needs through discussion and/or role play  Summary of Patient Progress: X  Therapeutic Modalities:   Cognitive Behavioral Therapy Brief Therapy Feelings Identification   Assunta Curtis, MSW, LCSW 01/05/2019 12:40 PM

## 2019-01-05 NOTE — BHH Suicide Risk Assessment (Signed)
[facility] Discharge Suicide Risk Assessment   Principal Problem: Schizophrenia [facility]) Discharge Diagnoses: Principal Problem:   Schizophrenia ([place])   Total Time spent with patient: 20 minutes  Musculoskeletal: Strength & Muscle Tone: decreased Gait & Station: shuffle Patient leans: N/A  Psychiatric Specialty Exam: Review of Systems  All other systems reviewed and are negative.   Blood pressure 113/78, pulse 97, temperature 98.6 F (37 C), temperature source Oral, resp. rate 17, height 5\' 10"  (1.778 m), weight 90.3 kg, SpO2 95 %.Body mass index is 28.55 kg/m.  General Appearance: Casual  Eye Contact::  Fair  Speech:  Normal Rate409  Volume:  Normal  Mood:  Anxious  Affect:  Congruent  Thought Process:  Goal Directed and Descriptions of Associations: Circumstantial  Orientation:  Full (Time, Place, and Person)  Thought Content:  Rumination  Suicidal Thoughts:  No  Homicidal Thoughts:  No  Memory:  Immediate;   Fair Recent;   Fair Remote;   Fair  Judgement:  Impaired  Insight:  Lacking  Psychomotor Activity:  Increased  Concentration:  Fair  Recall:  [employer] of Knowledge:Fair  Language: Fair  Akathisia:  Negative  Handed:  Right  AIMS (if indicated):     Assets:  Desire for Improvement Resilience  Sleep:  Number of Hours: 7.5  Cognition: Impaired,  Mild  ADL's:  Intact   Mental Status Per Nursing Assessment::   On Admission:  NA  Demographic Factors:  Male, Caucasian, Low socioeconomic status and Unemployed  Loss Factors: NA  Historical Factors: Impulsivity  Risk Reduction Factors:   NA  Continued Clinical Symptoms:  Schizophrenia:   Paranoid or undifferentiated type  Cognitive Features That Contribute To Risk:  Thought constriction (tunnel vision)    Suicide Risk:  Minimal: No identifiable suicidal ideation.  Patients presenting with no risk factors but with morbid ruminations; may be classified as minimal risk based on the severity of the  depressive symptoms  Follow-up Information    [facility] Follow up.   Why: Your ACT Team will follow up with you on Contact information: [place] [address] [phone]           Plan Of Care/Follow-up recommendations:  Activity:  ad lib  Sharma Covert, MD 01/05/2019, 9:03 AM

## 2019-01-05 NOTE — BHH Counselor (Signed)
CSW received phone call from [facility] guardian) who reports if the pt's group home does not provide transportation today at 4pm, then she will transport the pt to the home on tomorrow(11/25).

## 2019-01-05 NOTE — Progress Notes (Signed)
Recreation Therapy Notes  INPATIENT RECREATION TR PLAN  Patient Details Name: Peter Parsons MRN: [id number] DOB: [birth year] Today's Date: 01/05/2019  Rec Therapy Plan Is patient appropriate for Therapeutic Recreation?: Yes Treatment times per week: at least 3 Estimated Length of Stay: 5-7 days TR Treatment/Interventions: Group participation (Comment)  Discharge Criteria Pt will be discharged from therapy if:: Discharged Treatment plan/goals/alternatives discussed and agreed upon by:: Patient/family  Discharge Summary Short term goals set: Patient will engage in groups without prompting or encouragement from LRT x3 group sessions within 5 recreation therapy group sessions Short term goals met: Complete Progress toward goals comments: Groups attended Which groups?: Communication, Self-esteem, Stress management, Other (Comment)(Relaxation, Self-care) Reason goals not met: N/A Therapeutic equipment acquired: N/A Reason patient discharged from therapy: Discharge from hospital Pt/family agrees with progress & goals achieved: Yes Date patient discharged from therapy: 01/05/19   Charelle Petrakis 01/05/2019, 1:10 PM

## 2019-01-05 NOTE — BHH Counselor (Addendum)
CSW contacted Cecilie Lowers and [facility] again in an attempt to verify when they would be able to follow up with the patient following discharge.   Cecilie Lowers reports that [employer], is the [facility] staff to follow up with this CSW regarding appointments.   Cecilie Lowers also reports that the guardian informed him this morning that the transportation for the group home is no longer viable and was curious if patient could go by cab.  CSW reports that she will ask the physician his thoughts on the matter.  He does report that with permission it is possible for [facility] to provide transportation with permission from their director.  Assunta Curtis, MSW, LCSW 01/05/2019 11:31 AM

## 2019-01-05 NOTE — Progress Notes (Signed)
Recreation Therapy Notes  Date: 01/05/2019  Time: 9:30 am  Location: Craft room  Behavioral response: Appropriate   Intervention Topic: Communication     Discussion/Intervention:   Group content today was focused on communication. The group defined communication and ways to communicate with others. Individuals stated reason why communication is important and some reasons to communicate with others. Patients expressed if they thought they were good at communicating with others and ways they could improve their communication skills. The group identified important parts of communication and some experiences they have had in the past with communication. The group participated in the intervention "Words in a Bag", where they had a chance to test out their communication skills and identify ways to improve their communication techniques.  Clinical Observations/Feedback:  Patient came to group and defined communication as seeing people and talking to them. Individual participated in the intervention and was social with peers and staff during group. Leticia Mcdiarmid LRT/CTRS         Quincey Quesinberry 01/05/2019 11:58 AM

## 2019-01-05 NOTE — Plan of Care (Addendum)
Pt rates depression and anxiety 3/10 but denies SI, HI and VH but admits to having AH that tell him "I'm not going to make it".  Pt was educated on care plan and verbalizes understanding. Collier Bullock RN Problem: Education: Goal: Knowledge of [place] [facility] Education information/materials will improve Outcome: Adequate for Discharge   Problem: Coping: Goal: Ability to verbalize frustrations and anger appropriately will improve Outcome: Adequate for Discharge Goal: Ability to demonstrate self-control will improve Outcome: Adequate for Discharge   Problem: Safety: Goal: Periods of time without injury will increase Outcome: Adequate for Discharge

## 2019-01-05 NOTE — BHH Counselor (Signed)
CSW received a call from [place] at [facility].  She reports that they will follow up with the patient on 01/06/2019 at 1:00pm.    CSW followed up on earlier reports from [place] that with permission from their  Director the ACT team could provide transportation.  Colletta [place] stated that they will NOT provide transportation.  When asked by this CSW if she had followed up with their director as indicated by Cecilie Lowers she reports "I have not because I know that he will not say yes because of Covid."  Assunta Curtis, MSW, LCSW 01/05/2019 12:05 PM

## 2019-01-05 NOTE — Progress Notes (Signed)
Pt denies SI and HI. Pt was educated on DC plan and verbalizes understanding. Pt was dc with belongings and dc packet. Collier Bullock RN

## 2019-01-05 NOTE — Plan of Care (Signed)
  Problem: Group Participation Goal: STG - Patient will engage in groups without prompting or encouragement from LRT x3 group sessions within 5 recreation therapy group sessions Description: STG - Patient will engage in groups without prompting or encouragement from LRT x3 group sessions within 5 recreation therapy group sessions Outcome: Completed/Met

## 2019-01-25 ENCOUNTER — Emergency Department
Admission: EM | Admit: 2019-01-25 | Discharge: 2019-01-26 | Disposition: A | Discharge status: Home / Self Care | Payer: Medicaid Other | Attending: Emergency Medicine | Admitting: Emergency Medicine

## 2019-01-25 ENCOUNTER — Other Ambulatory Visit: Payer: Self-pay

## 2019-01-25 ENCOUNTER — Emergency Department: Payer: Medicaid Other

## 2019-01-25 DIAGNOSIS — I10 Essential (primary) hypertension: Secondary | ICD-10-CM | POA: Insufficient documentation

## 2019-01-25 DIAGNOSIS — Z79899 Other long term (current) drug therapy: Secondary | ICD-10-CM | POA: Diagnosis not present

## 2019-01-25 DIAGNOSIS — R4182 Altered mental status, unspecified: Secondary | ICD-10-CM

## 2019-01-25 DIAGNOSIS — Z7984 Long term (current) use of oral hypoglycemic drugs: Secondary | ICD-10-CM | POA: Diagnosis not present

## 2019-01-25 DIAGNOSIS — E119 Type 2 diabetes mellitus without complications: Secondary | ICD-10-CM | POA: Insufficient documentation

## 2019-01-25 DIAGNOSIS — Z87891 Personal history of nicotine dependence: Secondary | ICD-10-CM | POA: Diagnosis not present

## 2019-01-25 LAB — COMPREHENSIVE METABOLIC PANEL
ALT: 13 U/L (ref 0–44)
AST: 19 U/L (ref 15–41)
Albumin: 4.6 g/dL (ref 3.5–5.0)
Alkaline Phosphatase: 77 U/L (ref 38–126)
Anion gap: 15 (ref 5–15)
BUN: 32 mg/dL — ABNORMAL HIGH (ref 6–20)
CO2: 24 mmol/L (ref 22–32)
Calcium: 9.6 mg/dL (ref 8.9–10.3)
Chloride: 105 mmol/L (ref 98–111)
Creatinine, Ser: 1.64 mg/dL — ABNORMAL HIGH (ref 0.61–1.24)
GFR calc Af Amer: 52 mL/min — ABNORMAL LOW (ref >60)
GFR calc non Af Amer: 45 mL/min — ABNORMAL LOW (ref >60)
Glucose, Bld: 103 mg/dL — ABNORMAL HIGH (ref 70–99)
Potassium: 4.1 mmol/L (ref 3.5–5.1)
Sodium: 144 mmol/L (ref 135–145)
Total Bilirubin: 1.2 mg/dL (ref 0.3–1.2)
Total Protein: 8.4 g/dL — ABNORMAL HIGH (ref 6.5–8.1)

## 2019-01-25 LAB — URINE DRUG SCREEN, QUALITATIVE (ARMC ONLY)
Amphetamines, Ur Screen: NOT DETECTED
Barbiturates, Ur Screen: NOT DETECTED
Benzodiazepine, Ur Scrn: POSITIVE — AB
Cannabinoid 50 Ng, Ur ~~LOC~~: NOT DETECTED
Cocaine Metabolite,Ur ~~LOC~~: NOT DETECTED
MDMA (Ecstasy)Ur Screen: NOT DETECTED
Methadone Scn, Ur: NOT DETECTED
Opiate, Ur Screen: NOT DETECTED
Phencyclidine (PCP) Ur S: NOT DETECTED
Tricyclic, Ur Screen: NOT DETECTED

## 2019-01-25 LAB — URINALYSIS, ROUTINE W REFLEX MICROSCOPIC
Bilirubin Urine: NEGATIVE
Glucose, UA: NEGATIVE mg/dL
Ketones, ur: NEGATIVE mg/dL
Leukocytes,Ua: NEGATIVE
Nitrite: NEGATIVE
Protein, ur: NEGATIVE mg/dL
Specific Gravity, Urine: 1.016 (ref 1.005–1.030)
Squamous Epithelial / HPF: NONE SEEN (ref 0–5)
pH: 5 (ref 5.0–8.0)

## 2019-01-25 LAB — CBC
HCT: 30.4 % — ABNORMAL LOW (ref 39.0–52.0)
Hemoglobin: 11.2 g/dL — ABNORMAL LOW (ref 13.0–17.0)
MCH: 29.8 pg (ref 26.0–34.0)
MCHC: 36.8 g/dL — ABNORMAL HIGH (ref 30.0–36.0)
MCV: 80.9 fL (ref 80.0–100.0)
Platelets: 198 10*3/uL (ref 150–400)
RBC: 3.76 MIL/uL — ABNORMAL LOW (ref 4.22–5.81)
RDW: 13.9 % (ref 11.5–15.5)
WBC: 5.9 10*3/uL (ref 4.0–10.5)
nRBC: 0 % (ref 0.0–0.2)

## 2019-01-25 LAB — ETHANOL: Alcohol, Ethyl (B): <10 mg/dL (ref <10)

## 2019-01-25 LAB — GLUCOSE, CAPILLARY: Glucose-Capillary: 92 mg/dL (ref 70–99)

## 2019-01-25 MED ORDER — TRAZODONE HCL 50 MG PO TABS: 150.0000 mg | ORAL_TABLET | Freq: Every evening | ORAL | Status: DC | PRN

## 2019-01-25 MED ORDER — CLONAZEPAM 0.5 MG PO TABS: 0.5000 mg | ORAL_TABLET | Freq: Three times a day (TID) | ORAL | Status: DC | PRN

## 2019-01-25 MED ORDER — SODIUM CHLORIDE 0.9% FLUSH: 3.0000 mL | Freq: Once | INTRAVENOUS | Status: DC

## 2019-01-25 MED ORDER — SIMVASTATIN 10 MG PO TABS
20.0000 mg | ORAL_TABLET | Freq: Every day | ORAL | Status: DC
  Administered 2019-01-26: 20 mg via ORAL
  Filled 2019-01-25: qty 2

## 2019-01-25 MED ORDER — LISINOPRIL 10 MG PO TABS
20.0000 mg | ORAL_TABLET | Freq: Every day | ORAL | Status: DC
  Administered 2019-01-26: 20 mg via ORAL
  Filled 2019-01-25: qty 2

## 2019-01-25 MED ORDER — BENZTROPINE MESYLATE 1 MG PO TABS
1.0000 mg | ORAL_TABLET | Freq: Two times a day (BID) | ORAL | Status: DC
  Administered 2019-01-26 (×2): 1 mg via ORAL
  Filled 2019-01-25 (×2): qty 1

## 2019-01-25 MED ORDER — SODIUM CHLORIDE 0.9 % IV BOLUS
1000.0000 mL | Freq: Once | INTRAVENOUS | Status: AC
  Administered 2019-01-25: 19:00:00 1000 mL via INTRAVENOUS

## 2019-01-25 MED ORDER — METFORMIN HCL 500 MG PO TABS
500.0000 mg | ORAL_TABLET | Freq: Two times a day (BID) | ORAL | Status: DC
  Administered 2019-01-26: 500 mg via ORAL
  Filled 2019-01-25: qty 1

## 2019-01-25 MED ORDER — ESCITALOPRAM OXALATE 10 MG PO TABS
30.0000 mg | ORAL_TABLET | Freq: Every day | ORAL | Status: DC
  Administered 2019-01-26: 30 mg via ORAL
  Filled 2019-01-25: qty 3

## 2019-01-25 MED ORDER — GABAPENTIN 300 MG PO CAPS
300.0000 mg | ORAL_CAPSULE | Freq: Three times a day (TID) | ORAL | Status: DC
  Administered 2019-01-26 (×2): 300 mg via ORAL
  Filled 2019-01-25 (×2): qty 1

## 2019-01-25 MED ORDER — RISPERIDONE 1 MG PO TABS
3.0000 mg | ORAL_TABLET | Freq: Two times a day (BID) | ORAL | Status: DC
  Administered 2019-01-26 (×2): 3 mg via ORAL
  Filled 2019-01-25 (×2): qty 3

## 2019-01-25 MED ORDER — TAMSULOSIN HCL 0.4 MG PO CAPS
0.4000 mg | ORAL_CAPSULE | Freq: Every day | ORAL | Status: DC
  Administered 2019-01-26: 0.4 mg via ORAL
  Filled 2019-01-25: qty 1

## 2019-01-25 NOTE — ED Notes (Signed)
This RN introduced self to pt. Pt was able to state his name and the year. Pt disoriented to situation and place. Pt calm and cooperative and in NAD. Will continue to monitor.

## 2019-01-25 NOTE — ED Notes (Signed)
This RN attempted to call [facility] contact person, Cecilie Lowers, but was unable to reach him.

## 2019-01-25 NOTE — ED Triage Notes (Addendum)
Pt comes into the ED with staff from the group home [facility], [facility] staff with pt, states the pt has not been acting himself, forgets his own name does know what day it is. They are not sure if it is medication related or not. Increased weakness not able to ambulate without assist. Pt smell of urine on arrival. Staff Shirlean Mylar reports pt is normally able to do all daily living task and lately urinating on self, multiple falls, disoriented. States he is normally up falling her around everywhere and now unable to

## 2019-01-25 NOTE — ED Provider Notes (Signed)
[facility] Emergency Department Provider Note  ____________________________________________   First MD Initiated Contact with Patient 01/25/19 1857     (approximate)  I have reviewed the triage vital signs and the nursing notes.   HISTORY  Chief Complaint Altered Mental Status    HPI Peter Parsons is a 58 y.o. male with schizophrenia, diabetes, hypertension comes in with altered mental status.  Patient came in from group home.  Patient is not been acting his normal self.  He forgot his own name what day it is.  They also noted that he is not able to ambulate as well.  Normally he is able to do his ADLs and has been having falls.  For me patient is oriented x2 but him unable to give exact history of why he is here.  Therefore history is limited due to altered mental status.    Past Medical History:  Diagnosis Date  . Diabetes mellitus without complication ([place])   . Hypertension   . Schizophrenia [facility])     Patient Active Problem List   Diagnosis Date Noted  . Schizophrenia ([place]) 12/15/2018  . Paranoid schizophrenia ([place])     History reviewed. No pertinent surgical history.  Prior to Admission medications   Medication Sig Start Date End Date Taking? Authorizing Provider  benztropine (COGENTIN) 1 MG tablet Take 1 tablet (1 mg total) by mouth 2 (two) times daily. 01/05/19   Starkes-Perry, Gayland Curry, FNP  clonazePAM (KLONOPIN) 0.5 MG tablet Take 1 tablet (0.5 mg total) by mouth 3 (three) times daily as needed (anxiety). 01/05/19   Starkes-Perry, Gayland Curry, FNP  escitalopram (LEXAPRO) 10 MG tablet Take 3 tablets (30 mg total) by mouth daily. 01/06/19   Starkes-Perry, Gayland Curry, FNP  gabapentin (NEURONTIN) 300 MG capsule Take 300 mg by mouth 3 (three) times daily. 11/05/18   [provider]  lisinopril (ZESTRIL) 20 MG tablet Take 1 tablet (20 mg total) by mouth daily. 01/06/19   Starkes-Perry, Gayland Curry, FNP  metFORMIN (GLUCOPHAGE) 500 MG tablet Take 1  tablet (500 mg total) by mouth 2 (two) times daily with a meal. 01/05/19   Starkes-Perry, Gayland Curry, FNP  nicotine (NICODERM CQ - DOSED IN MG/24 HOURS) 14 mg/24hr patch Place 1 patch (14 mg total) onto the skin daily. 01/06/19   Starkes-Perry, Gayland Curry, FNP  paliperidone (INVEGA SUSTENNA) 234 MG/1.5ML SUSY injection Inject 234 mg into the muscle every 28 (twenty-eight) days. 01/14/19   Starkes-Perry, Gayland Curry, FNP  risperiDONE (RISPERDAL) 3 MG tablet Take 1 tablet (3 mg total) by mouth 2 (two) times daily. 01/05/19   Starkes-Perry, Gayland Curry, FNP  simvastatin (ZOCOR) 20 MG tablet Take 20 mg by mouth daily. 11/06/18   [provider]  tamsulosin (FLOMAX) 0.4 MG CAPS capsule Take 0.4 mg by mouth daily. 08/12/18   [provider]  traZODone (DESYREL) 150 MG tablet Take 150 mg by mouth at bedtime as needed. 11/09/18   [provider]    Allergies Acetaminophen  No family history on file.  Social History Social History   Tobacco Use  . Smoking status: Former Research scientist (life sciences)  . Smokeless tobacco: Never Used  Substance Use Topics  . Alcohol use: Not Currently  . Drug use: Not Currently      Review of Systems Review of systems is limited due to altered mental status.  ________________________   PHYSICAL EXAM:  VITAL SIGNS: ED Triage Vitals  Enc Vitals Group     BP 01/25/19 1449 126/68  Pulse Rate 01/25/19 1449 (!) 124     Resp 01/25/19 1449 16     Temp 01/25/19 1449 99.3 F (37.4 C)     Temp Source 01/25/19 1449 Oral     SpO2 01/25/19 1449 96 %     Weight 01/25/19 1451 199 lb (90.3 kg)     Height 01/25/19 1451 5\' 10"  (1.778 m)     Head Circumference --      Peak Flow --      Pain Score 01/25/19 1449 0     Pain Loc --      Pain Edu? --      Excl. in [place]? --     Constitutional: Alert and oriented x2. Eyes: Conjunctivae are normal. EOMI. Head: Atraumatic. Nose: No congestion/rhinnorhea. Mouth/Throat: Mucous membranes are moist.   Neck: No stridor. Trachea  Midline. FROM Cardiovascular: Tachycardic, regular rhythm. Grossly normal heart sounds.  Good peripheral circulation. Respiratory: Normal respiratory effort.  No retractions. Lungs CTAB. Gastrointestinal: Soft and nontender. No distention. No abdominal bruits.  Musculoskeletal: No lower extremity tenderness nor edema.  No joint effusions. Neurologic:  Normal speech and language. No gross focal neurologic deficits are appreciated.  Moves all of his extremities well. Skin:  Skin is warm, dry and intact. No rash noted. Psychiatric: Somewhat confused, oriented x2.  GU: Deferred   ____________________________________________   LABS (all labs ordered are listed, but only abnormal results are displayed)  Labs Reviewed  COMPREHENSIVE METABOLIC PANEL - Abnormal; Notable for the following components:      Result Value   Glucose, Bld 103 (*)    BUN 32 (*)    Creatinine, Ser 1.64 (*)    Total Protein 8.4 (*)    GFR calc non Af Amer 45 (*)    GFR calc Af Amer 52 (*)    All other components within normal limits  CBC - Abnormal; Notable for the following components:   RBC 3.76 (*)    Hemoglobin 11.2 (*)    HCT 30.4 (*)    MCHC 36.8 (*)    All other components within normal limits  URINALYSIS, ROUTINE W REFLEX MICROSCOPIC - Abnormal; Notable for the following components:   Color, Urine YELLOW (*)    APPearance HAZY (*)    Hgb urine dipstick LARGE (*)    Bacteria, UA RARE (*)    All other components within normal limits  URINE DRUG SCREEN, QUALITATIVE ([facility] ONLY) - Abnormal; Notable for the following components:   Benzodiazepine, Ur Scrn POSITIVE (*)    All other components within normal limits  GLUCOSE, CAPILLARY  ETHANOL  CBG MONITORING, ED   ____________________________________________   ED ECG REPORT I, Vanessa [place], the attending physician, personally viewed and interpreted this ECG.  Normal sinus rate of 123, no st elevation, no twi, normal intervals.    ____________________________________________  RADIOLOGY  Official radiology report(s): CT Head Wo Contrast  Result Date: 01/25/2019 CLINICAL DATA:  Confusion. EXAM: CT HEAD WITHOUT CONTRAST CT CERVICAL SPINE WITHOUT CONTRAST TECHNIQUE: Multidetector CT imaging of the head and cervical spine was performed following the standard protocol without intravenous contrast. Multiplanar CT image reconstructions of the cervical spine were also generated. COMPARISON:  None. FINDINGS: CT HEAD FINDINGS Brain: No evidence of acute infarction, hemorrhage, hydrocephalus, extra-axial collection or mass lesion/mass effect. Mild atrophy is noted. Vascular: No hyperdense vessel or unexpected calcification. Skull: Normal. Negative for fracture or focal lesion. Sinuses/Orbits: No acute finding. Other: None. CT CERVICAL SPINE FINDINGS Alignment: There is straightening of  the normal cervical lordotic curvature. Skull base and vertebrae: No acute fracture. No primary bone lesion or focal pathologic process. Soft tissues and spinal canal: No prevertebral fluid or swelling. No visible canal hematoma. Disc levels:  The disc heights are relatively well preserved. Upper chest: Negative. Other: None IMPRESSION: 1. Unremarkable CT evaluation of the head. 2. Straightening of the normal cervical lordotic curvature may be due to positioning or muscle spasm. 3. No evidence for acute fracture or subluxation of the cervical spine. Electronically Signed   By: Constance Holster M.D.   On: 01/25/2019 20:40   CT Cervical Spine Wo Contrast  Result Date: 01/25/2019 CLINICAL DATA:  Confusion. EXAM: CT HEAD WITHOUT CONTRAST CT CERVICAL SPINE WITHOUT CONTRAST TECHNIQUE: Multidetector CT imaging of the head and cervical spine was performed following the standard protocol without intravenous contrast. Multiplanar CT image reconstructions of the cervical spine were also generated. COMPARISON:  None. FINDINGS: CT HEAD FINDINGS Brain: No evidence of  acute infarction, hemorrhage, hydrocephalus, extra-axial collection or mass lesion/mass effect. Mild atrophy is noted. Vascular: No hyperdense vessel or unexpected calcification. Skull: Normal. Negative for fracture or focal lesion. Sinuses/Orbits: No acute finding. Other: None. CT CERVICAL SPINE FINDINGS Alignment: There is straightening of the normal cervical lordotic curvature. Skull base and vertebrae: No acute fracture. No primary bone lesion or focal pathologic process. Soft tissues and spinal canal: No prevertebral fluid or swelling. No visible canal hematoma. Disc levels:  The disc heights are relatively well preserved. Upper chest: Negative. Other: None IMPRESSION: 1. Unremarkable CT evaluation of the head. 2. Straightening of the normal cervical lordotic curvature may be due to positioning or muscle spasm. 3. No evidence for acute fracture or subluxation of the cervical spine. Electronically Signed   By: Constance Holster M.D.   On: 01/25/2019 20:40    ____________________________________________   PROCEDURES  Procedure(s) performed (including Critical Care):  Procedures   ____________________________________________   INITIAL IMPRESSION / ASSESSMENT AND PLAN / ED COURSE  Peter Parsons was evaluated in Emergency Department on 01/25/2019 for the symptoms described in the history of present illness. He was evaluated in the context of the global COVID-19 pandemic, which necessitated consideration that the patient might be at risk for infection with the SARS-CoV-2 virus that causes COVID-19. Institutional protocols and algorithms that pertain to the evaluation of patients at risk for COVID-19 are in a state of rapid change based on information released by regulatory bodies including the CDC and federal and state organizations. These policies and algorithms were followed during the patient's care in the ED.    Patient presents with altered mental status.  Get labs to evaluate for Electro  abnormalities, AKI, urine evaluate for UTI and CT scan evaluate for intracranial hemorrhage.  Patient's urine is without evidence of UTI.  Patient's creatinine was elevated at 1.64.   His CT head was negative.  Attempted call the legal guardian but she did not pick up.  Also left a message.  D/w Shirlean Mylar [phone] did not act his normal self today or where he was which. He was normal yesterday. Recently had changes in medications. Difficulty ambulating.   Reevaluated patient he now seems to be oriented x3.  Will discuss with the psychiatric team to make sure that from their perspective that he is cleared. Also trying to get ahold of legal gaurdian or care giver from home to see if he is at baseline.  Pt handed off pending psych eval, family assessment to see if he is back to baseline  and hopefully d/c home.  ____________________________________________   FINAL CLINICAL IMPRESSION(S) / ED DIAGNOSES   Final diagnoses:  Altered mental status, unspecified altered mental status type      MEDICATIONS GIVEN DURING THIS VISIT:  Medications  sodium chloride flush (NS) 0.9 % injection 3 mL ( Intravenous Canceled Entry 01/25/19 1855)  sodium chloride 0.9 % bolus 1,000 mL (0 mLs Intravenous Stopped 01/25/19 2053)     ED Discharge Orders    None       Note:  This document was prepared using Dragon voice recognition software and may include unintentional dictation errors.   Vanessa [place], MD 01/25/19 2342

## 2019-01-25 NOTE — ED Notes (Signed)
This RN attempted to call pt's legal guardian, [name]. This RN left a voicemail and number for callback.

## 2019-01-25 NOTE — ED Notes (Signed)
This RN spoke w/ pt's caregiver, Shirlean Mylar. Shirlean Mylar stated that she will be unable to come to the ED until morning due to having three other clients she is watching over that are sleeping. She stated pt's baseline is A&Ox4, ambulating w/o assistance and bathing/feeding himself.

## 2019-01-25 NOTE — ED Notes (Signed)
This RN spoke with pt caregiver, Shirlean Mylar [phone].

## 2019-01-26 DIAGNOSIS — R4182 Altered mental status, unspecified: Secondary | ICD-10-CM | POA: Insufficient documentation

## 2019-01-26 NOTE — ED Notes (Signed)
Hourly rounding reveals patient in room. No complaints, stable, in no acute distress. Q15 minute rounds and monitoring via Rover and Officer to continue.   

## 2019-01-26 NOTE — ED Provider Notes (Signed)
-----------------------------------------   6:48 AM on 01/26/2019 -----------------------------------------   Blood pressure 111/75, pulse 65, temperature 99.3 F (37.4 C), temperature source Oral, resp. rate 18, height 1.778 m (5\' 10" ), weight 90.3 kg, SpO2 95 %.  The patient is calm and cooperative at this time.  There have been no acute events since the last update.  Awaiting disposition plan from Behavioral Medicine and/or Social Work team(s).   Hinda Kehr, MD 01/26/19 [phone]

## 2019-01-26 NOTE — ED Notes (Signed)
BEHAVIORAL HEALTH ROUNDING Patient sleeping: No. Patient alert and oriented: yes Behavior appropriate: Yes.  ; If no, describe:  Nutrition and fluids offered: yes Toileting and hygiene offered: Yes  Sitter present: q15 minute observations  Law enforcement present: Yes [employer]  

## 2019-01-26 NOTE — ED Notes (Signed)
BEHAVIORAL HEALTH ROUNDING Patient sleeping: No. Patient alert and oriented: yes Behavior appropriate: Yes.  ; If no, describe:  Nutrition and fluids offered: yes Toileting and hygiene offered: Yes  Sitter present: q15 minute observations  

## 2019-01-26 NOTE — ED Notes (Addendum)
Pt. Transferred from room 10 to room 23 after dressing out and screening for contraband. Report to include Situation, Background, Assessment and Recommendations from [facility] RN. Pt. Oriented to Quad including Q15 minute rounds as well as Engineer, drilling for their protection. Patient is alert and oriented, warm and dry in no acute distress. Patient denies SI, HI, and AVH. Pt. Encouraged to let me know if needs arise.

## 2019-01-26 NOTE — ED Notes (Signed)
This RN and Melody, EDT dressed pt out in burgundy color scrubs. Pt belongings were placed in bag as follows: 1 black jacket 1 pair black pants 1 pair brown dress shoes 1 plaid shirt 1 black belt

## 2019-01-26 NOTE — ED Notes (Signed)
[place]  [phone]  - called for an update    Guardian [name]  [phone]     HIPPA compliant message left and call back requested

## 2019-01-26 NOTE — ED Notes (Signed)
ED Is the patient under IVC or is there intent for IVC:  No  Is the patient medically cleared: Yes.   Is there vacancy in the ED BHU: Yes.   Is the population mix appropriate for patient: Yes.   Is the patient awaiting placement in inpatient or outpatient setting: he is from a group home  - per [place]  [phone]  He may return once cleared from ED   Has the patient had a psychiatric consult:  Consult pending Survey of unit performed for contraband, proper placement and condition of furniture, tampering with fixtures in bathroom, shower, and each patient room: Yes.  ; Findings:  APPEARANCE/BEHAVIOR Calm and cooperative NEURO ASSESSMENT Orientation: oriented   Denies pain Hallucinations: No.None noted (Hallucinations) denies at this time  Speech: Normal Gait: normal RESPIRATORY ASSESSMENT Even  Unlabored respirations  CARDIOVASCULAR ASSESSMENT Pulses equal   regular rate  Skin warm and dry   GASTROINTESTINAL ASSESSMENT no GI complaint EXTREMITIES Full ROM  PLAN OF CARE Provide calm/safe environment. Vital signs assessed twice daily. ED BHU Assessment once each 12-hour shift.  Assure the ED provider has rounded once each shift. Provide and encourage hygiene. Provide redirection as needed. Assess for escalating behavior; address immediately and inform ED provider.  Assess family dynamic and appropriateness for visitation as needed: Yes.  ; If necessary, describe findings:  Educate the patient/family about BHU procedures/visitation: Yes.  ; If necessary, describe findings:

## 2019-01-26 NOTE — ED Provider Notes (Signed)
Psychiatric team discussed with the caregiver who reports that patient now seems to be at baseline.  They are comfortable with patient being discharged home and they will follow up with her act team.    Vanessa [place], MD 01/26/19 [phone]

## 2019-01-26 NOTE — Discharge Instructions (Signed)
Work-up was reassuring and patient was cleared by the psychiatric team.  Follow-up with the act team as needed.  Return the ER for any other concerns

## 2019-01-26 NOTE — ED Notes (Signed)
Received a callback from [place]  [phone]   Update provided to her  He has an ACT team  [phone]    Guardian and ACT team questioning if pt needs medication adjustment
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
## 2019-01-26 NOTE — ED Notes (Signed)
BEHAVIORAL HEALTH ROUNDING Patient sleeping: Yes.   Patient alert and oriented: eyes closed  Appears asleep Behavior appropriate: Yes.  ; If no, describe:  Nutrition and fluids offered: Yes  Toileting and hygiene offered: sleeping Sitter present: q 15 minute observations  Law enforcement present: yes  [employer] 

## 2019-01-26 NOTE — ED Notes (Signed)
BEHAVIORAL HEALTH ROUNDING Patient sleeping: Yes.   Patient alert and oriented: eyes closed  Appears asleep Behavior appropriate: Yes.  ; If no, describe:  Nutrition and fluids offered: Yes  Toileting and hygiene offered: sleeping Sitter present: q 15 minute observations  Law enforcement present: yes  [employer]  ENVIRONMENTAL ASSESSMENT Potentially harmful objects out of patient reach: Yes.   Personal belongings secured: Yes.   Patient dressed in hospital provided attire only: Yes.   Plastic bags out of patient reach: Yes.   Patient care equipment (cords, cables, call bells, lines, and drains) shortened, removed, or accounted for: Yes.   Equipment and supplies removed from bottom of stretcher: Yes.   Potentially toxic materials out of patient reach: Yes.   Sharps container removed or out of patient reach: Yes.

## 2019-01-26 NOTE — BH Assessment (Addendum)
TTS accompanied Psych Provider to assess pt. Pt has been cleared psychiatrically and recommended for discharge back to his group home.  Collateral information obtained from [place]: [phone] (caretaker) who reports pt has been "acting different". He has been incontinent and using the restroom in inappropriate locations. He was not oriented to name, date, and location. She requested for pt's medications to adjusted, she was informed that pt would have to refer to his outpatient provider regarding his medications. She reported to this writer that pt was safe to return to his place of residence.  Caregiver will arrive at 4:30p to p/u pt at discharge.  This Probation officer, along with Dr. Claris Gower, made several attempts to contact pt's legal guardian [name]: [phone] to inform of pt being discharged. No answer - HIPAA compliant voicemail left with call back number provided.

## 2019-01-26 NOTE — ED Notes (Signed)
Patient observed lying in bed with eyes closed  Even, unlabored respirations observed   NAD pt appears to be sleeping  I will continue to monitor along with every 15 minute visual observations     

## 2019-01-26 NOTE — ED Notes (Signed)
MD is currently at his bedside

## 2019-01-26 NOTE — ED Notes (Addendum)
Pt is showering at this time. Pt given clean scrubs and underwear.

## 2019-01-26 NOTE — ED Notes (Signed)
Attempted to call legal guardian who did not answer the phone. Left a message that the patient is being discharged and requested her to call back.

## 2019-01-26 NOTE — ED Notes (Signed)
BEHAVIORAL HEALTH ROUNDING Patient sleeping: No. Patient alert : yes Behavior appropriate: Yes.  ; If no, describe:  Nutrition and fluids offered: yes Toileting and hygiene offered: Yes  Sitter present: q15 minute observations  Law enforcement present: Yes  [employer]

## 2019-01-26 NOTE — ED Notes (Addendum)
Hourly rounding reveals patient in room. No complaints, stable, in no acute distress. Q15 minute rounds and monitoring via [place] and Officer to continue.   

## 2019-02-07 ENCOUNTER — Other Ambulatory Visit: Payer: Self-pay

## 2019-02-07 ENCOUNTER — Emergency Department (HOSPITAL_COMMUNITY)
Admission: EM | Admit: 2019-02-07 | Discharge: 2019-02-07 | Disposition: A | Discharge status: Home / Self Care | Payer: Medicaid Other | Attending: Emergency Medicine | Admitting: Emergency Medicine

## 2019-02-07 ENCOUNTER — Encounter (HOSPITAL_COMMUNITY): Payer: Self-pay

## 2019-02-07 ENCOUNTER — Emergency Department (HOSPITAL_COMMUNITY): Payer: Medicaid Other

## 2019-02-07 DIAGNOSIS — Y998 Other external cause status: Secondary | ICD-10-CM | POA: Diagnosis not present

## 2019-02-07 DIAGNOSIS — Z87891 Personal history of nicotine dependence: Secondary | ICD-10-CM | POA: Insufficient documentation

## 2019-02-07 DIAGNOSIS — Y9301 Activity, walking, marching and hiking: Secondary | ICD-10-CM | POA: Diagnosis not present

## 2019-02-07 DIAGNOSIS — Z79899 Other long term (current) drug therapy: Secondary | ICD-10-CM | POA: Diagnosis not present

## 2019-02-07 DIAGNOSIS — S0990XA Unspecified injury of head, initial encounter: Secondary | ICD-10-CM | POA: Diagnosis present

## 2019-02-07 DIAGNOSIS — E119 Type 2 diabetes mellitus without complications: Secondary | ICD-10-CM | POA: Diagnosis not present

## 2019-02-07 DIAGNOSIS — W01198A Fall on same level from slipping, tripping and stumbling with subsequent striking against other object, initial encounter: Secondary | ICD-10-CM | POA: Diagnosis not present

## 2019-02-07 DIAGNOSIS — I10 Essential (primary) hypertension: Secondary | ICD-10-CM | POA: Diagnosis not present

## 2019-02-07 DIAGNOSIS — Y9289 Other specified places as the place of occurrence of the external cause: Secondary | ICD-10-CM | POA: Diagnosis not present

## 2019-02-07 DIAGNOSIS — Z7984 Long term (current) use of oral hypoglycemic drugs: Secondary | ICD-10-CM | POA: Insufficient documentation

## 2019-02-07 DIAGNOSIS — W19XXXA Unspecified fall, initial encounter: Secondary | ICD-10-CM

## 2019-02-07 DIAGNOSIS — M542 Cervicalgia: Secondary | ICD-10-CM | POA: Diagnosis not present

## 2019-02-07 MED ORDER — LIDOCAINE 5 % EX PTCH: 1.0000 | MEDICATED_PATCH | CUTANEOUS | Status: DC

## 2019-02-07 NOTE — ED Notes (Signed)
Report given to Robin

## 2019-02-07 NOTE — ED Notes (Signed)
Pt verbalized discharge instructions and follow up care. Alert and ambulatory. No IV. Leaving with [facility]
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
## 2019-02-07 NOTE — ED Notes (Signed)
[facility] contacted and paperwork printed  

## 2019-02-07 NOTE — ED Provider Notes (Signed)
[facility] DEPT Provider Note   CSN: [id number] Arrival date & time: 02/07/19  1607     History Chief Complaint  Patient presents with  . Neck Pain    Peter Parsons is a 58 y.o. male.  Peter Parsons is a 58 y.o. male with a history of schizophrenia, hypertension and diabetes, who was brought in by ambulance from group home.  Per group home staff patient left the group home today stating that he wanted to go for a walk because he did not like being at the group home.  Patient stated that he tripped and fell during the walk striking his head and neck.  Patient denies loss of consciousness, headache, vision changes, nausea, vomiting, dizziness, numbness weakness or tingling.  Patient is complaining of some mild posterior neck pain and was placed in a c-collar by EMS.  He denies any other injuries from the fall no chest or back pain, no abdominal pain.  Denies any pain over his extremities.  No cuts or scrapes.  No other aggravating or alleviating factors.        Past Medical History:  Diagnosis Date  . Diabetes mellitus without complication ([place])   . Hypertension   . Schizophrenia [facility])     Patient Active Problem List   Diagnosis Date Noted  . Altered mental status   . Schizophrenia ([place]) 12/15/2018  . Paranoid schizophrenia ([place])     History reviewed. No pertinent surgical history.     No family history on file.  Social History   Tobacco Use  . Smoking status: Former Research scientist (life sciences)  . Smokeless tobacco: Never Used  Substance Use Topics  . Alcohol use: Not Currently  . Drug use: Not Currently    Home Medications Prior to Admission medications   Medication Sig Start Date End Date Taking? Authorizing Provider  benztropine (COGENTIN) 1 MG tablet Take 1 tablet (1 mg total) by mouth 2 (two) times daily. 01/05/19   Starkes-Perry, Gayland Curry, FNP  clonazePAM (KLONOPIN) 0.5 MG tablet Take 1 tablet (0.5 mg total) by mouth 3 (three) times daily as needed  (anxiety). 01/05/19   Starkes-Perry, Gayland Curry, FNP  escitalopram (LEXAPRO) 10 MG tablet Take 3 tablets (30 mg total) by mouth daily. 01/06/19   Starkes-Perry, Gayland Curry, FNP  gabapentin (NEURONTIN) 300 MG capsule Take 300 mg by mouth 3 (three) times daily. 11/05/18   [provider]  lisinopril (ZESTRIL) 20 MG tablet Take 1 tablet (20 mg total) by mouth daily. 01/06/19   Starkes-Perry, Gayland Curry, FNP  metFORMIN (GLUCOPHAGE) 500 MG tablet Take 1 tablet (500 mg total) by mouth 2 (two) times daily with a meal. 01/05/19   Starkes-Perry, Gayland Curry, FNP  nicotine (NICODERM CQ - DOSED IN MG/24 HOURS) 14 mg/24hr patch Place 1 patch (14 mg total) onto the skin daily. 01/06/19   Starkes-Perry, Gayland Curry, FNP  paliperidone (INVEGA SUSTENNA) 234 MG/1.5ML SUSY injection Inject 234 mg into the muscle every 28 (twenty-eight) days. 01/14/19   Starkes-Perry, Gayland Curry, FNP  risperiDONE (RISPERDAL) 3 MG tablet Take 1 tablet (3 mg total) by mouth 2 (two) times daily. 01/05/19   Starkes-Perry, Gayland Curry, FNP  simvastatin (ZOCOR) 20 MG tablet Take 20 mg by mouth daily. 11/06/18   [provider]  tamsulosin (FLOMAX) 0.4 MG CAPS capsule Take 0.4 mg by mouth daily. 08/12/18   [provider]  traZODone (DESYREL) 150 MG tablet Take 150 mg by mouth at bedtime as needed. 11/09/18   [provider]  Allergies    Acetaminophen  Review of Systems   Review of Systems  Constitutional: Negative for chills and fever.  Eyes: Negative for visual disturbance.  Musculoskeletal: Positive for neck pain. Negative for arthralgias, back pain and myalgias.  Skin: Negative for color change and rash.  Neurological: Negative for dizziness, syncope, facial asymmetry, speech difficulty, weakness, light-headedness, numbness and headaches.    Physical Exam Updated Vital Signs BP 97/72   Pulse (!) 104   Temp (!) 97.5 F (36.4 C) (Oral)   Resp 16   SpO2 100%   Physical Exam Vitals and nursing note reviewed.    Constitutional:      General: He is not in acute distress.    Appearance: Normal appearance. He is well-developed and normal weight. He is not diaphoretic.  HENT:     Head: Normocephalic and atraumatic.     Comments: Scalp without obvious trauma, no palpable hematoma, step-off or deformity, negative battle sign Eyes:     General:        Right eye: No discharge.        Left eye: No discharge.  Neck:     Comments: Patient does endorse some tenderness to palpation over midline C-spine, c-collar in place. Pulmonary:     Effort: Pulmonary effort is normal. No respiratory distress.  Chest:     Chest wall: No tenderness.  Musculoskeletal:     Comments: Moving all extremities without difficulty  Neurological:     Mental Status: He is alert.     Coordination: Coordination normal.     Comments: Speech is clear, able to follow commands CN III-XII intact Normal strength in upper and lower extremities bilaterally including dorsiflexion and plantar flexion, strong and equal grip strength Sensation normal to light and sharp touch Moves extremities without ataxia, coordination intact  Psychiatric:        Mood and Affect: Mood normal.        Behavior: Behavior normal.     ED Results / Procedures / Treatments   Labs (all labs ordered are listed, but only abnormal results are displayed) Labs Reviewed - No data to display  EKG None  Radiology CT Head Wo Contrast  Result Date: 02/07/2019 CLINICAL DATA:  [place] after ex gaping from group home this morning, no loss of consciousness, complaining of neck pain post fall EXAM: CT HEAD WITHOUT CONTRAST CT CERVICAL SPINE WITHOUT CONTRAST TECHNIQUE: Multidetector CT imaging of the head and cervical spine was performed following the standard protocol without intravenous contrast. Multiplanar CT image reconstructions of the cervical spine were also generated. COMPARISON:  01/25/2019 FINDINGS: CT HEAD FINDINGS Brain: Mild generalized atrophy. Normal  ventricular morphology. No midline shift or mass effect. Normal appearance of brain parenchyma. No intracranial hemorrhage, mass lesion or evidence of acute infarction. No extra-axial fluid collections. Vascular: No hyperdense vessels. Skull: Intact Sinuses/Orbits: Clear Other: N/A CT CERVICAL SPINE FINDINGS Alignment: Minimal retrolisthesis at C3-C4. Remaining alignments normal. Skull base and vertebrae: Osseous mineralization low normal. Skull base intact. Multilevel degenerative disc disease changes. Minimal scattered endplate spur formation and disc space narrowing. No fracture, additional subluxation or bone destruction. Soft tissues and spinal canal: Prevertebral soft tissues normal thickness. Minimal atherosclerotic calcifications at the carotid bifurcations and proximal great vessels. Disc levels:  Small central disc protrusions at C2-C3, C4-C5 Upper chest: Lung apices clear Other: N/A IMPRESSION: Generalized atrophy. No acute intracranial abnormalities. Multilevel degenerative disc and facet disease changes of the cervical spine. No acute cervical spine abnormalities. Electronically Signed  By: Lavonia Dana M.D.   On: 02/07/2019 16:57   CT Cervical Spine Wo Contrast  Result Date: 02/07/2019 CLINICAL DATA:  [place] after ex gaping from group home this morning, no loss of consciousness, complaining of neck pain post fall EXAM: CT HEAD WITHOUT CONTRAST CT CERVICAL SPINE WITHOUT CONTRAST TECHNIQUE: Multidetector CT imaging of the head and cervical spine was performed following the standard protocol without intravenous contrast. Multiplanar CT image reconstructions of the cervical spine were also generated. COMPARISON:  01/25/2019 FINDINGS: CT HEAD FINDINGS Brain: Mild generalized atrophy. Normal ventricular morphology. No midline shift or mass effect. Normal appearance of brain parenchyma. No intracranial hemorrhage, mass lesion or evidence of acute infarction. No extra-axial fluid collections. Vascular: No  hyperdense vessels. Skull: Intact Sinuses/Orbits: Clear Other: N/A CT CERVICAL SPINE FINDINGS Alignment: Minimal retrolisthesis at C3-C4. Remaining alignments normal. Skull base and vertebrae: Osseous mineralization low normal. Skull base intact. Multilevel degenerative disc disease changes. Minimal scattered endplate spur formation and disc space narrowing. No fracture, additional subluxation or bone destruction. Soft tissues and spinal canal: Prevertebral soft tissues normal thickness. Minimal atherosclerotic calcifications at the carotid bifurcations and proximal great vessels. Disc levels:  Small central disc protrusions at C2-C3, C4-C5 Upper chest: Lung apices clear Other: N/A IMPRESSION: Generalized atrophy. No acute intracranial abnormalities. Multilevel degenerative disc and facet disease changes of the cervical spine. No acute cervical spine abnormalities. Electronically Signed   By: Lavonia Dana M.D.   On: 02/07/2019 16:57    Procedures Procedures (including critical care time)  Medications Ordered in ED Medications  lidocaine (LIDODERM) 5 % 1 patch (has no administration in time range)    ED Course  I have reviewed the triage vital signs and the nursing notes.  Pertinent labs & imaging results that were available during my care of the patient were reviewed by me and considered in my medical decision making (see chart for details).  Clinical Course as of Feb 06 1801  Sun Feb 07, 2019  1657 CTs of the head and cervical spine with no acute injury from fall, chronic atrophy and degenerative disc disease noted.  CT Head Wo Contrast [KF]    Clinical Course User Index [KF] Janet Berlin   MDM Rules/Calculators/A&P                      58 year old male presents from group home after mechanical fall where he hit his head and neck, no loss of consciousness, normal neurologic exam on arrival.  C-collar placed by EMS.  CT scans reassuring with no evidence of acute injury.  Likely  muscle strain from fall.  C-collar removed, discharged back to group home facility with Tylenol and topical treatment for pain.  Final Clinical Impression(s) / ED Diagnoses Final diagnoses:  Fall, initial encounter  Neck pain  Injury of head, initial encounter    Rx / DC Orders ED Discharge Orders    None       Janet Berlin 02/07/19 1807    Dorie Rank, MD 02/07/19 1921

## 2019-02-07 NOTE — ED Notes (Signed)
Provided update to Peter Parsons from group home.

## 2019-02-07 NOTE — Discharge Instructions (Signed)
Your CT scans are reassuring, likely mild musculoskeletal pain from fall, use Tylenol, lidocaine patches or muscle rubs for pain relief.

## 2019-02-07 NOTE — ED Notes (Signed)
Attempted to contact Peter Parsons ([place]) [phone]. LVM requesting call back.

## 2019-02-11 ENCOUNTER — Inpatient Hospital Stay
Admission: EM | Admit: 2019-02-11 | Discharge: 2019-07-12 | DRG: 070 | Disposition: A | Discharge status: Nursing Home (Includes ICF) | Payer: Medicaid Other | Attending: Internal Medicine | Admitting: Internal Medicine

## 2019-02-11 ENCOUNTER — Other Ambulatory Visit: Payer: Self-pay

## 2019-02-11 ENCOUNTER — Encounter: Payer: Self-pay | Admitting: Emergency Medicine

## 2019-02-11 ENCOUNTER — Emergency Department: Payer: Medicaid Other

## 2019-02-11 DIAGNOSIS — S3730XA Unspecified injury of urethra, initial encounter: Secondary | ICD-10-CM | POA: Diagnosis not present

## 2019-02-11 DIAGNOSIS — E876 Hypokalemia: Secondary | ICD-10-CM | POA: Diagnosis not present

## 2019-02-11 DIAGNOSIS — Z923 Personal history of irradiation: Secondary | ICD-10-CM

## 2019-02-11 DIAGNOSIS — Z886 Allergy status to analgesic agent status: Secondary | ICD-10-CM

## 2019-02-11 DIAGNOSIS — D696 Thrombocytopenia, unspecified: Secondary | ICD-10-CM

## 2019-02-11 DIAGNOSIS — R531 Weakness: Secondary | ICD-10-CM

## 2019-02-11 DIAGNOSIS — R4189 Other symptoms and signs involving cognitive functions and awareness: Secondary | ICD-10-CM | POA: Diagnosis present

## 2019-02-11 DIAGNOSIS — R159 Full incontinence of feces: Secondary | ICD-10-CM | POA: Diagnosis present

## 2019-02-11 DIAGNOSIS — B952 Enterococcus as the cause of diseases classified elsewhere: Secondary | ICD-10-CM

## 2019-02-11 DIAGNOSIS — Z79899 Other long term (current) drug therapy: Secondary | ICD-10-CM

## 2019-02-11 DIAGNOSIS — Z87891 Personal history of nicotine dependence: Secondary | ICD-10-CM

## 2019-02-11 DIAGNOSIS — N320 Bladder-neck obstruction: Secondary | ICD-10-CM | POA: Diagnosis not present

## 2019-02-11 DIAGNOSIS — F209 Schizophrenia, unspecified: Secondary | ICD-10-CM

## 2019-02-11 DIAGNOSIS — R338 Other retention of urine: Secondary | ICD-10-CM

## 2019-02-11 DIAGNOSIS — R627 Adult failure to thrive: Secondary | ICD-10-CM | POA: Diagnosis present

## 2019-02-11 DIAGNOSIS — T83511A Infection and inflammatory reaction due to indwelling urethral catheter, initial encounter: Secondary | ICD-10-CM | POA: Diagnosis not present

## 2019-02-11 DIAGNOSIS — R0603 Acute respiratory distress: Secondary | ICD-10-CM | POA: Diagnosis not present

## 2019-02-11 DIAGNOSIS — R471 Dysarthria and anarthria: Secondary | ICD-10-CM | POA: Diagnosis present

## 2019-02-11 DIAGNOSIS — R4587 Impulsiveness: Secondary | ICD-10-CM | POA: Diagnosis not present

## 2019-02-11 DIAGNOSIS — R6521 Severe sepsis with septic shock: Secondary | ICD-10-CM | POA: Diagnosis not present

## 2019-02-11 DIAGNOSIS — G309 Alzheimer's disease, unspecified: Secondary | ICD-10-CM

## 2019-02-11 DIAGNOSIS — L89152 Pressure ulcer of sacral region, stage 2: Secondary | ICD-10-CM | POA: Diagnosis not present

## 2019-02-11 DIAGNOSIS — R059 Cough, unspecified: Secondary | ICD-10-CM

## 2019-02-11 DIAGNOSIS — R1310 Dysphagia, unspecified: Secondary | ICD-10-CM

## 2019-02-11 DIAGNOSIS — R4182 Altered mental status, unspecified: Secondary | ICD-10-CM | POA: Diagnosis present

## 2019-02-11 DIAGNOSIS — I10 Essential (primary) hypertension: Secondary | ICD-10-CM | POA: Diagnosis present

## 2019-02-11 DIAGNOSIS — E11649 Type 2 diabetes mellitus with hypoglycemia without coma: Secondary | ICD-10-CM | POA: Diagnosis present

## 2019-02-11 DIAGNOSIS — N182 Chronic kidney disease, stage 2 (mild): Secondary | ICD-10-CM | POA: Diagnosis present

## 2019-02-11 DIAGNOSIS — Z751 Person awaiting admission to adequate facility elsewhere: Secondary | ICD-10-CM

## 2019-02-11 DIAGNOSIS — F0390 Unspecified dementia without behavioral disturbance: Secondary | ICD-10-CM | POA: Diagnosis present

## 2019-02-11 DIAGNOSIS — G9341 Metabolic encephalopathy: Secondary | ICD-10-CM | POA: Diagnosis not present

## 2019-02-11 DIAGNOSIS — F0281 Dementia in other diseases classified elsewhere with behavioral disturbance: Secondary | ICD-10-CM

## 2019-02-11 DIAGNOSIS — R296 Repeated falls: Secondary | ICD-10-CM | POA: Diagnosis present

## 2019-02-11 DIAGNOSIS — Z20822 Contact with and (suspected) exposure to covid-19: Secondary | ICD-10-CM | POA: Diagnosis present

## 2019-02-11 DIAGNOSIS — R260 Ataxic gait: Secondary | ICD-10-CM | POA: Diagnosis present

## 2019-02-11 DIAGNOSIS — G459 Transient cerebral ischemic attack, unspecified: Secondary | ICD-10-CM

## 2019-02-11 DIAGNOSIS — E119 Type 2 diabetes mellitus without complications: Secondary | ICD-10-CM

## 2019-02-11 DIAGNOSIS — R05 Cough: Secondary | ICD-10-CM | POA: Diagnosis not present

## 2019-02-11 DIAGNOSIS — E1122 Type 2 diabetes mellitus with diabetic chronic kidney disease: Secondary | ICD-10-CM | POA: Diagnosis present

## 2019-02-11 DIAGNOSIS — L8990 Pressure ulcer of unspecified site, unspecified stage: Secondary | ICD-10-CM | POA: Diagnosis present

## 2019-02-11 DIAGNOSIS — L89616 Pressure-induced deep tissue damage of right heel: Secondary | ICD-10-CM | POA: Diagnosis not present

## 2019-02-11 DIAGNOSIS — R410 Disorientation, unspecified: Secondary | ICD-10-CM

## 2019-02-11 DIAGNOSIS — R339 Retention of urine, unspecified: Secondary | ICD-10-CM

## 2019-02-11 DIAGNOSIS — Z9114 Patient's other noncompliance with medication regimen: Secondary | ICD-10-CM

## 2019-02-11 DIAGNOSIS — Z818 Family history of other mental and behavioral disorders: Secondary | ICD-10-CM

## 2019-02-11 DIAGNOSIS — E860 Dehydration: Secondary | ICD-10-CM | POA: Diagnosis present

## 2019-02-11 DIAGNOSIS — Z915 Personal history of self-harm: Secondary | ICD-10-CM

## 2019-02-11 DIAGNOSIS — F0391 Unspecified dementia with behavioral disturbance: Secondary | ICD-10-CM | POA: Diagnosis present

## 2019-02-11 DIAGNOSIS — N170 Acute kidney failure with tubular necrosis: Secondary | ICD-10-CM | POA: Diagnosis present

## 2019-02-11 DIAGNOSIS — B369 Superficial mycosis, unspecified: Secondary | ICD-10-CM | POA: Diagnosis present

## 2019-02-11 DIAGNOSIS — L89622 Pressure ulcer of left heel, stage 2: Secondary | ICD-10-CM | POA: Diagnosis not present

## 2019-02-11 DIAGNOSIS — T8383XA Hemorrhage of genitourinary prosthetic devices, implants and grafts, initial encounter: Secondary | ICD-10-CM | POA: Diagnosis not present

## 2019-02-11 DIAGNOSIS — R5381 Other malaise: Secondary | ICD-10-CM | POA: Diagnosis present

## 2019-02-11 DIAGNOSIS — I129 Hypertensive chronic kidney disease with stage 1 through stage 4 chronic kidney disease, or unspecified chronic kidney disease: Secondary | ICD-10-CM | POA: Diagnosis present

## 2019-02-11 DIAGNOSIS — E1142 Type 2 diabetes mellitus with diabetic polyneuropathy: Secondary | ICD-10-CM | POA: Diagnosis present

## 2019-02-11 DIAGNOSIS — F200 Paranoid schizophrenia: Secondary | ICD-10-CM | POA: Diagnosis not present

## 2019-02-11 DIAGNOSIS — T443X5A Adverse effect of other parasympatholytics [anticholinergics and antimuscarinics] and spasmolytics, initial encounter: Secondary | ICD-10-CM | POA: Diagnosis not present

## 2019-02-11 DIAGNOSIS — N401 Enlarged prostate with lower urinary tract symptoms: Secondary | ICD-10-CM | POA: Diagnosis not present

## 2019-02-11 DIAGNOSIS — K5909 Other constipation: Secondary | ICD-10-CM | POA: Diagnosis not present

## 2019-02-11 DIAGNOSIS — G4700 Insomnia, unspecified: Secondary | ICD-10-CM | POA: Diagnosis present

## 2019-02-11 DIAGNOSIS — Z8546 Personal history of malignant neoplasm of prostate: Secondary | ICD-10-CM

## 2019-02-11 DIAGNOSIS — R45851 Suicidal ideations: Secondary | ICD-10-CM

## 2019-02-11 DIAGNOSIS — Y92230 Patient room in hospital as the place of occurrence of the external cause: Secondary | ICD-10-CM | POA: Diagnosis not present

## 2019-02-11 DIAGNOSIS — D649 Anemia, unspecified: Secondary | ICD-10-CM

## 2019-02-11 DIAGNOSIS — E538 Deficiency of other specified B group vitamins: Secondary | ICD-10-CM | POA: Diagnosis present

## 2019-02-11 DIAGNOSIS — K5903 Drug induced constipation: Secondary | ICD-10-CM | POA: Diagnosis not present

## 2019-02-11 DIAGNOSIS — E43 Unspecified severe protein-calorie malnutrition: Secondary | ICD-10-CM

## 2019-02-11 DIAGNOSIS — N390 Urinary tract infection, site not specified: Secondary | ICD-10-CM | POA: Diagnosis not present

## 2019-02-11 DIAGNOSIS — D62 Acute posthemorrhagic anemia: Secondary | ICD-10-CM | POA: Diagnosis not present

## 2019-02-11 DIAGNOSIS — Z7984 Long term (current) use of oral hypoglycemic drugs: Secondary | ICD-10-CM

## 2019-02-11 DIAGNOSIS — B850 Pediculosis due to Pediculus humanus capitis: Secondary | ICD-10-CM | POA: Diagnosis present

## 2019-02-11 DIAGNOSIS — Y846 Urinary catheterization as the cause of abnormal reaction of the patient, or of later complication, without mention of misadventure at the time of the procedure: Secondary | ICD-10-CM | POA: Diagnosis not present

## 2019-02-11 DIAGNOSIS — A4181 Sepsis due to Enterococcus: Secondary | ICD-10-CM | POA: Diagnosis not present

## 2019-02-11 DIAGNOSIS — Z6823 Body mass index (BMI) 23.0-23.9, adult: Secondary | ICD-10-CM

## 2019-02-11 DIAGNOSIS — F3130 Bipolar disorder, current episode depressed, mild or moderate severity, unspecified: Secondary | ICD-10-CM | POA: Diagnosis present

## 2019-02-11 DIAGNOSIS — Z9183 Wandering in diseases classified elsewhere: Secondary | ICD-10-CM

## 2019-02-11 DIAGNOSIS — R509 Fever, unspecified: Secondary | ICD-10-CM

## 2019-02-11 DIAGNOSIS — F319 Bipolar disorder, unspecified: Secondary | ICD-10-CM | POA: Diagnosis present

## 2019-02-11 DIAGNOSIS — Z801 Family history of malignant neoplasm of trachea, bronchus and lung: Secondary | ICD-10-CM

## 2019-02-11 DIAGNOSIS — N400 Enlarged prostate without lower urinary tract symptoms: Secondary | ICD-10-CM | POA: Diagnosis present

## 2019-02-11 DIAGNOSIS — R29810 Facial weakness: Secondary | ICD-10-CM

## 2019-02-11 DIAGNOSIS — R7881 Bacteremia: Secondary | ICD-10-CM | POA: Diagnosis not present

## 2019-02-11 DIAGNOSIS — D631 Anemia in chronic kidney disease: Secondary | ICD-10-CM | POA: Diagnosis present

## 2019-02-11 DIAGNOSIS — R330 Drug induced retention of urine: Secondary | ICD-10-CM | POA: Diagnosis not present

## 2019-02-11 DIAGNOSIS — R4702 Dysphasia: Secondary | ICD-10-CM | POA: Diagnosis present

## 2019-02-11 DIAGNOSIS — E785 Hyperlipidemia, unspecified: Secondary | ICD-10-CM

## 2019-02-11 DIAGNOSIS — D6959 Other secondary thrombocytopenia: Secondary | ICD-10-CM | POA: Diagnosis not present

## 2019-02-11 DIAGNOSIS — I444 Left anterior fascicular block: Secondary | ICD-10-CM | POA: Diagnosis present

## 2019-02-11 DIAGNOSIS — Z515 Encounter for palliative care: Secondary | ICD-10-CM | POA: Diagnosis not present

## 2019-02-11 DIAGNOSIS — F418 Other specified anxiety disorders: Secondary | ICD-10-CM | POA: Diagnosis present

## 2019-02-11 DIAGNOSIS — T424X5A Adverse effect of benzodiazepines, initial encounter: Secondary | ICD-10-CM | POA: Diagnosis not present

## 2019-02-11 LAB — COMPREHENSIVE METABOLIC PANEL
ALT: 16 U/L (ref 0–44)
AST: 26 U/L (ref 15–41)
Albumin: 4.7 g/dL (ref 3.5–5.0)
Alkaline Phosphatase: 86 U/L (ref 38–126)
Anion gap: 10 (ref 5–15)
BUN: 44 mg/dL — ABNORMAL HIGH (ref 6–20)
CO2: 27 mmol/L (ref 22–32)
Calcium: 9.3 mg/dL (ref 8.9–10.3)
Chloride: 103 mmol/L (ref 98–111)
Creatinine, Ser: 1.99 mg/dL — ABNORMAL HIGH (ref 0.61–1.24)
GFR calc Af Amer: 41 mL/min — ABNORMAL LOW (ref >60)
GFR calc non Af Amer: 35 mL/min — ABNORMAL LOW (ref >60)
Glucose, Bld: 100 mg/dL — ABNORMAL HIGH (ref 70–99)
Potassium: 3.9 mmol/L (ref 3.5–5.1)
Sodium: 140 mmol/L (ref 135–145)
Total Bilirubin: 0.8 mg/dL (ref 0.3–1.2)
Total Protein: 7.9 g/dL (ref 6.5–8.1)

## 2019-02-11 LAB — CBC
HCT: 31.3 % — ABNORMAL LOW (ref 39.0–52.0)
Hemoglobin: 10.8 g/dL — ABNORMAL LOW (ref 13.0–17.0)
MCH: 29.3 pg (ref 26.0–34.0)
MCHC: 34.5 g/dL (ref 30.0–36.0)
MCV: 84.8 fL (ref 80.0–100.0)
Platelets: 143 10*3/uL — ABNORMAL LOW (ref 150–400)
RBC: 3.69 MIL/uL — ABNORMAL LOW (ref 4.22–5.81)
RDW: 13.9 % (ref 11.5–15.5)
WBC: 5.4 10*3/uL (ref 4.0–10.5)
nRBC: 0 % (ref 0.0–0.2)

## 2019-02-11 LAB — URINE DRUG SCREEN, QUALITATIVE (ARMC ONLY)
Amphetamines, Ur Screen: NOT DETECTED
Barbiturates, Ur Screen: NOT DETECTED
Benzodiazepine, Ur Scrn: NOT DETECTED
Cannabinoid 50 Ng, Ur ~~LOC~~: NOT DETECTED
Cocaine Metabolite,Ur ~~LOC~~: NOT DETECTED
MDMA (Ecstasy)Ur Screen: NOT DETECTED
Methadone Scn, Ur: NOT DETECTED
Opiate, Ur Screen: NOT DETECTED
Phencyclidine (PCP) Ur S: NOT DETECTED
Tricyclic, Ur Screen: NOT DETECTED

## 2019-02-11 LAB — RESPIRATORY PANEL BY RT PCR (FLU A&B, COVID)
Influenza A by PCR: NEGATIVE
Influenza B by PCR: NEGATIVE
SARS Coronavirus 2 by RT PCR: NEGATIVE

## 2019-02-11 LAB — ETHANOL: Alcohol, Ethyl (B): <10 mg/dL (ref <10)

## 2019-02-11 MED ORDER — TAMSULOSIN HCL 0.4 MG PO CAPS
0.4000 mg | ORAL_CAPSULE | Freq: Every day | ORAL | Status: DC
  Administered 2019-02-11 – 2019-05-09 (×87): 0.4 mg via ORAL
  Filled 2019-02-11 (×87): qty 1

## 2019-02-11 MED ORDER — LISINOPRIL 10 MG PO TABS
20.0000 mg | ORAL_TABLET | Freq: Every day | ORAL | Status: DC
  Administered 2019-02-11 – 2019-03-19 (×33): 20 mg via ORAL
  Filled 2019-02-11: qty 1
  Filled 2019-02-11: qty 2
  Filled 2019-02-11 (×35): qty 1

## 2019-02-11 MED ORDER — RISPERIDONE 3 MG PO TABS
3.0000 mg | ORAL_TABLET | Freq: Two times a day (BID) | ORAL | Status: DC
  Administered 2019-02-12 – 2019-06-09 (×229): 3 mg via ORAL
  Filled 2019-02-11 (×192): qty 1
  Filled 2019-02-11: qty 3
  Filled 2019-02-11 (×16): qty 1
  Filled 2019-02-11: qty 3
  Filled 2019-02-11 (×7): qty 1
  Filled 2019-02-11: qty 3
  Filled 2019-02-11 (×4): qty 1
  Filled 2019-02-11: qty 3
  Filled 2019-02-11 (×19): qty 1

## 2019-02-11 MED ORDER — METFORMIN HCL 500 MG PO TABS
500.0000 mg | ORAL_TABLET | Freq: Two times a day (BID) | ORAL | Status: DC
  Administered 2019-02-12 – 2019-03-20 (×72): 500 mg via ORAL
  Filled 2019-02-11 (×74): qty 1

## 2019-02-11 MED ORDER — TRAZODONE HCL 50 MG PO TABS
150.0000 mg | ORAL_TABLET | Freq: Every day | ORAL | Status: DC
  Administered 2019-02-12 – 2019-06-09 (×114): 150 mg via ORAL
  Filled 2019-02-11: qty 1
  Filled 2019-02-11: qty 3
  Filled 2019-02-11 (×10): qty 1
  Filled 2019-02-11: qty 3
  Filled 2019-02-11 (×2): qty 1
  Filled 2019-02-11: qty 3
  Filled 2019-02-11 (×3): qty 1
  Filled 2019-02-11 (×2): qty 3
  Filled 2019-02-11: qty 1
  Filled 2019-02-11: qty 3
  Filled 2019-02-11 (×4): qty 1
  Filled 2019-02-11: qty 3
  Filled 2019-02-11 (×2): qty 1
  Filled 2019-02-11 (×3): qty 3
  Filled 2019-02-11 (×22): qty 1
  Filled 2019-02-11: qty 3
  Filled 2019-02-11 (×4): qty 1
  Filled 2019-02-11: qty 3
  Filled 2019-02-11 (×4): qty 1
  Filled 2019-02-11: qty 3
  Filled 2019-02-11: qty 1
  Filled 2019-02-11: qty 3
  Filled 2019-02-11: qty 1
  Filled 2019-02-11: qty 3
  Filled 2019-02-11 (×2): qty 1
  Filled 2019-02-11 (×2): qty 3
  Filled 2019-02-11 (×12): qty 1
  Filled 2019-02-11: qty 3
  Filled 2019-02-11 (×3): qty 1
  Filled 2019-02-11: qty 3
  Filled 2019-02-11: qty 1
  Filled 2019-02-11: qty 3
  Filled 2019-02-11 (×5): qty 1
  Filled 2019-02-11 (×2): qty 3
  Filled 2019-02-11 (×4): qty 1
  Filled 2019-02-11: qty 3
  Filled 2019-02-11 (×10): qty 1

## 2019-02-11 MED ORDER — ATORVASTATIN CALCIUM 20 MG PO TABS
10.0000 mg | ORAL_TABLET | Freq: Every day | ORAL | Status: DC
  Administered 2019-02-11 – 2019-07-11 (×149): 10 mg via ORAL
  Filled 2019-02-11 (×151): qty 1

## 2019-02-11 MED ORDER — LORAZEPAM 2 MG PO TABS
2.0000 mg | ORAL_TABLET | Freq: Once | ORAL | Status: AC
  Administered 2019-02-11: 2 mg via ORAL
  Filled 2019-02-11: qty 1

## 2019-02-11 MED ORDER — GABAPENTIN 300 MG PO CAPS
300.0000 mg | ORAL_CAPSULE | Freq: Three times a day (TID) | ORAL | Status: DC
  Administered 2019-02-12 – 2019-07-12 (×435): 300 mg via ORAL
  Filled 2019-02-11 (×234): qty 1
  Filled 2019-02-11: qty 3
  Filled 2019-02-11 (×62): qty 1
  Filled 2019-02-11: qty 3
  Filled 2019-02-11 (×92): qty 1
  Filled 2019-02-11: qty 3
  Filled 2019-02-11 (×20): qty 1
  Filled 2019-02-11: qty 3
  Filled 2019-02-11 (×28): qty 1

## 2019-02-11 NOTE — ED Notes (Signed)
Report to include Situation, Background, Assessment, and Recommendations received from [facility]. Patient alert and oriented, warm and dry, in no acute distress. Patient denies SI, HI, AVH and pain. Patient made aware of Q15 minute rounds and Engineer, drilling presence for their safety. Patient instructed to come to me with needs or concerns.

## 2019-02-11 NOTE — ED Notes (Signed)
Hourly rounding reveals patient in day room. No complaints, stable, in no acute distress. Q15 minute rounds and monitoring via Security Cam[employer] to continue. 

## 2019-02-11 NOTE — ED Provider Notes (Signed)
[facility] Emergency Department Provider Note  ____________________________________________  Time seen: Approximately 4:54 PM  I have reviewed the triage vital signs and the nursing notes.   HISTORY  Chief Complaint ivc  Level 5 Caveat: Portions of the History and Physical including HPI and review of systems are unable to be completely obtained due to patient being a poor historian    HPI Peter Parsons is a 58 y.o. male with a history of diabetes hypertension and schizophrenia who is brought to the ED under involuntary commitment from his group home due to wandering off into the woods multiple times.  Patient reports suicidal thoughts, with ideas of drowning himself.  While enforcement noted that patient has been found by bodies of water multiple times when he wanders away from the group home.  Patient denies any acute complaints.   He denies hallucinations.  Denies HI.     Past Medical History:  Diagnosis Date  . Diabetes mellitus without complication ([place])   . Hypertension   . Schizophrenia [facility])      Patient Active Problem List   Diagnosis Date Noted  . Altered mental status   . Schizophrenia ([place]) 12/15/2018  . Paranoid schizophrenia ([place])      History reviewed. No pertinent surgical history.   Prior to Admission medications   Medication Sig Start Date End Date Taking? Authorizing Provider  benztropine (COGENTIN) 1 MG tablet Take 1 tablet (1 mg total) by mouth 2 (two) times daily. 01/05/19   Starkes-Perry, Gayland Curry, FNP  clonazePAM (KLONOPIN) 0.5 MG tablet Take 1 tablet (0.5 mg total) by mouth 3 (three) times daily as needed (anxiety). 01/05/19   Starkes-Perry, Gayland Curry, FNP  escitalopram (LEXAPRO) 10 MG tablet Take 3 tablets (30 mg total) by mouth daily. 01/06/19   Starkes-Perry, Gayland Curry, FNP  gabapentin (NEURONTIN) 300 MG capsule Take 300 mg by mouth 3 (three) times daily. 11/05/18   [provider]  lisinopril (ZESTRIL) 20 MG tablet  Take 1 tablet (20 mg total) by mouth daily. 01/06/19   Starkes-Perry, Gayland Curry, FNP  metFORMIN (GLUCOPHAGE) 500 MG tablet Take 1 tablet (500 mg total) by mouth 2 (two) times daily with a meal. 01/05/19   Starkes-Perry, Gayland Curry, FNP  nicotine (NICODERM CQ - DOSED IN MG/24 HOURS) 14 mg/24hr patch Place 1 patch (14 mg total) onto the skin daily. 01/06/19   Starkes-Perry, Gayland Curry, FNP  paliperidone (INVEGA SUSTENNA) 234 MG/1.5ML SUSY injection Inject 234 mg into the muscle every 28 (twenty-eight) days. 01/14/19   Starkes-Perry, Gayland Curry, FNP  risperiDONE (RISPERDAL) 3 MG tablet Take 1 tablet (3 mg total) by mouth 2 (two) times daily. 01/05/19   Starkes-Perry, Gayland Curry, FNP  simvastatin (ZOCOR) 20 MG tablet Take 20 mg by mouth daily. 11/06/18   [provider]  tamsulosin (FLOMAX) 0.4 MG CAPS capsule Take 0.4 mg by mouth daily. 08/12/18   [provider]  traZODone (DESYREL) 150 MG tablet Take 150 mg by mouth at bedtime as needed. 11/09/18   [provider]     Allergies Acetaminophen   History reviewed. No pertinent family history.  Social History Social History   Tobacco Use  . Smoking status: Former Research scientist (life sciences)  . Smokeless tobacco: Never Used  Substance Use Topics  . Alcohol use: Not Currently  . Drug use: Not Currently    Review of Systems  Constitutional:   No fever or chills.  ENT:   No sore throat. No rhinorrhea. Cardiovascular:   No chest pain  or syncope. Respiratory:   No dyspnea or cough. Gastrointestinal:   Negative for abdominal pain, vomiting and diarrhea.  Musculoskeletal:   Negative for focal pain or swelling All other systems reviewed and are negative except as documented above in ROS and HPI.  ____________________________________________   PHYSICAL EXAM:  VITAL SIGNS: ED Triage Vitals  Enc Vitals Group     BP 02/11/19 1439 118/73     Pulse Rate 02/11/19 1439 (!) 119     Resp 02/11/19 1439 18     Temp 02/11/19 1439 98.4 F (36.9 C)     Temp  Source 02/11/19 1439 Oral     SpO2 02/11/19 1439 99 %     Weight 02/11/19 1441 170 lb (77.1 kg)     Height 02/11/19 1439 5\' 11"  (1.803 m)     Head Circumference --      Peak Flow --      Pain Score --      Pain Loc --      Pain Edu? --      Excl. in [place]? --     Vital signs reviewed, nursing assessments reviewed.   Constitutional:   Alert and oriented to person and place. Non-toxic appearance. Eyes:   Conjunctivae are normal. EOMI. PERRL. ENT      Head:   Normocephalic and atraumatic.      Nose:   Wearing a mask.      Mouth/Throat:   Wearing a mask.      Neck:   No meningismus. Full ROM. Hematological/Lymphatic/Immunilogical:   No cervical lymphadenopathy. Cardiovascular:   RRR. Symmetric bilateral radial and DP pulses.  No murmurs. Cap refill less than 2 seconds. Respiratory:   Normal respiratory effort without tachypnea/retractions. Breath sounds are clear and equal bilaterally. No wheezes/rales/rhonchi. Gastrointestinal:   Soft and nontender. Non distended. There is no CVA tenderness.  No rebound, rigidity, or guarding. Musculoskeletal:   Normal range of motion in all extremities. No joint effusions.  No lower extremity tenderness.  Knees and hips are nontender and stable.  No edema. Neurologic:   Normal speech and language.  Motor grossly intact. No acute focal neurologic deficits are appreciated.  Skin:    Skin is warm, dry and intact. No rash noted.  No petechiae, purpura, or bullae.  ____________________________________________    LABS (pertinent positives/negatives) (all labs ordered are listed, but only abnormal results are displayed) Labs Reviewed  COMPREHENSIVE METABOLIC PANEL - Abnormal; Notable for the following components:      Result Value   Glucose, Bld 100 (*)    BUN 44 (*)    Creatinine, Ser 1.99 (*)    GFR calc non Af Amer 35 (*)    GFR calc Af Amer 41 (*)    All other components within normal limits  CBC - Abnormal; Notable for the following  components:   RBC 3.69 (*)    Hemoglobin 10.8 (*)    HCT 31.3 (*)    Platelets 143 (*)    All other components within normal limits  RESPIRATORY PANEL BY RT PCR (FLU A&B, COVID)  ETHANOL  URINE DRUG SCREEN, QUALITATIVE ([facility] ONLY)   ____________________________________________   EKG    ____________________________________________    RADIOLOGY  No results found.  ____________________________________________   PROCEDURES Procedures  ____________________________________________    CLINICAL IMPRESSION / ASSESSMENT AND PLAN / ED COURSE  Medications ordered in the ED: Medications - No data to display  Pertinent labs & imaging results that were available during my care  of the patient were reviewed by me and considered in my medical decision making (see chart for details).  Peter Parsons was evaluated in Emergency Department on 02/11/2019 for the symptoms described in the history of present illness. He was evaluated in the context of the global COVID-19 pandemic, which necessitated consideration that the patient might be at risk for infection with the SARS-CoV-2 virus that causes COVID-19. Institutional protocols and algorithms that pertain to the evaluation of patients at risk for COVID-19 are in a state of rapid change based on information released by regulatory bodies including the CDC and federal and state organizations. These policies and algorithms were followed during the patient's care in the ED.   Patient presents with wandering, suicidal thoughts, suspicious for decompensation of his underlying schizophrenia, possible suicidality.  Continue IVC pending psychiatry evaluation.  He is medically stable on initial evaluation.  On my exam triage tachycardia is not present and his heart rate is about 80.      ____________________________________________   FINAL CLINICAL IMPRESSION(S) / ED DIAGNOSES    Final diagnoses:  Schizophrenia, unspecified type ([place])  Suicidal  thoughts     ED Discharge Orders    None      Portions of this note were generated with dragon dictation software. Dictation errors may occur despite best attempts at proofreading.   Carrie Mew, MD 02/11/19 [id number]

## 2019-02-11 NOTE — Consult Note (Signed)
[facility] Face-to-Face Psychiatry Consult   Reason for Consult: IVC Referring Physician: Joni Fears Patient Identification: Peter Parsons MRN:  [id number] Principal Diagnosis: Paranoid schizophrenia [facility]) Diagnosis:  Principal Problem:   Paranoid schizophrenia ([place]) Active Problems:   Schizophrenia ([place])   Altered mental status   Total Time spent with patient: 20 minutes  Subjective:   Peter Parsons is a 58 y.o. male patient Presented to [facility] ED via law enforcement under involuntary commitment status (IVC).  Per the ED triage nursing note, the patient has been leaving his group home a lot lately and wandering into the woods and vacant homes. The patient has a history of dementia and schizophrenia.  The patient voiced to the ED triage nurse, he is afraid of himself, and when pressed states, he is worried that he might hurt himself. The patient was seen face-to-face by this provider; chart reviewed and consulted with Dr. Joni Fears on 02/11/2019 due to the patient's care. It was discussed with the EDP that the patient does meet criteria to be admitted to the geriatric psychiatric inpatient unit.  On evaluation, the patient is alert, confused,  calm, and cooperative and mood-congruent with affect. The patient does not appear to be responding to internal or external stimuli. The patient is presenting with delusional thinking. The patient is unable to answer if he is experiencing auditory or visual hallucinations. The patient denies suicidal, homicidal, or self-harm ideations. The patient is presenting with psychotic and paranoid behaviors. During an encounter with the patient, he was unable to answer questions appropriately.  Plan: The patient is a safety risk to self and does require geriatric psychiatric inpatient admission for stabilization and treatment.  HPI: Per Dr. Joni Fears; Peter Parsons is a 58 y.o. male with a history of diabetes hypertension and schizophrenia who is brought to the ED under  involuntary commitment from his group home due to wandering off into the woods multiple times.  Patient reports suicidal thoughts, with ideas of drowning himself.  While enforcement noted that patient has been found by bodies of water multiple times when he wanders away from the group home.  Patient denies any acute complaints.  He denies hallucinations.  Denies HI.  Past Psychiatric History:  Schizophrenia ([place])  Risk to Self: Suicidal Ideation: Yes-Currently Present Suicidal Intent: No Is patient at risk for suicide?: No, but patient needs Medical Clearance Suicidal Plan?: Yes-Currently Present Specify Current Suicidal Plan: Drown his self Access to Means: No Specify Access to Suicidal Means: Drown self What has been your use of drugs/alcohol within the last 12 months?: Reports of none How many times?: 1 Other Self Harm Risks: Reports of none Triggers for Past Attempts: Other (Comment)(Mental Illness) Intentional Self Injurious Behavior: None Risk to Others: Homicidal Ideation: No Thoughts of Harm to Others: No Current Homicidal Intent: No Current Homicidal Plan: No Access to Homicidal Means: No Identified Victim: Reports of none History of harm to others?: No Assessment of Violence: None Noted Violent Behavior Description: Reports of none Does patient have access to weapons?: No Criminal Charges Pending?: No Describe Pending Criminal Charges: Reports of none Does patient have a court date: No Prior Inpatient Therapy: Prior Inpatient Therapy: Yes Prior Therapy Dates: 01/2019, 12/2018 & 05/2018 Prior Therapy Facilty/Provider(s): [facility] Reason for Treatment: Schizophrenia Prior Outpatient Therapy: Prior Outpatient Therapy: Yes Prior Therapy Dates: Current Prior Therapy Facilty/Provider(s): [facility] Reason for Treatment: Schizophrenia([facility]) Does patient have an ACCT team?: Yes Does patient have  Intensive In-House Services?  :  No Does patient have [facility] services? : No Does patient have P4CC services?: No  Past Medical History:  Past Medical History:  Diagnosis Date  . Diabetes mellitus without complication ([place])   . Hypertension   . Schizophrenia ([place])    History reviewed. No pertinent surgical history. Family History: History reviewed. No pertinent family history. Family Psychiatric  History:  Social History:  Social History   Substance and Sexual Activity  Alcohol Use Not Currently     Social History   Substance and Sexual Activity  Drug Use Not Currently    Social History   Socioeconomic History  . Marital status: Single    Spouse name: Not on file  . Number of children: Not on file  . Years of education: Not on file  . Highest education level: Not on file  Occupational History  . Not on file  Tobacco Use  . Smoking status: Former Research scientist (life sciences)  . Smokeless tobacco: Never Used  Substance and Sexual Activity  . Alcohol use: Not Currently  . Drug use: Not Currently  . Sexual activity: Not on file  Other Topics Concern  . Not on file  Social History Narrative  . Not on file   Social Determinants of Health   Financial Resource Strain:   . Difficulty of Paying Living Expenses: Not on file  Food Insecurity:   . Worried About Charity fundraiser in the Last Year: Not on file  . Ran Out of Food in the Last Year: Not on file  Transportation Needs:   . Lack of Transportation (Medical): Not on file  . Lack of Transportation (Non-Medical): Not on file  Physical Activity:   . Days of Exercise per Week: Not on file  . Minutes of Exercise per Session: Not on file  Stress:   . Feeling of Stress : Not on file  Social Connections:   . Frequency of Communication with Friends and Family: Not on file  . Frequency of Social Gatherings with Friends and Family: Not on file  . Attends Religious Services: Not on file  . Active Member of Clubs or Organizations: Not on  file  . Attends Archivist Meetings: Not on file  . Marital Status: Not on file   Additional Social History:    Allergies:   Allergies  Allergen Reactions  . Acetaminophen Other (See Comments)    Unknown Unable to breathe Chest  tightness/SOB     Labs:  Results for orders placed or performed during the hospital encounter of 02/11/19 (from the past 48 hour(s))  Comprehensive metabolic panel     Status: Abnormal   Collection Time: 02/11/19  2:45 PM  Result Value Ref Range   Sodium 140 135 - 145 mmol/L   Potassium 3.9 3.5 - 5.1 mmol/L   Chloride 103 98 - 111 mmol/L   CO2 27 22 - 32 mmol/L   Glucose, Bld 100 (H) 70 - 99 mg/dL   BUN 44 (H) 6 - 20 mg/dL   Creatinine, Ser 1.99 (H) 0.61 - 1.24 mg/dL   Calcium 9.3 8.9 - 10.3 mg/dL   Total Protein 7.9 6.5 - 8.1 g/dL   Albumin 4.7 3.5 - 5.0 g/dL   AST 26 15 - 41 U/L   ALT 16 0 - 44 U/L   Alkaline Phosphatase 86 38 - 126 U/L   Total Bilirubin 0.8 0.3 - 1.2 mg/dL   GFR calc non Af Amer 35 (L) >60 mL/min   GFR calc Af Amer 41 (  L) >60 mL/min   Anion gap 10 5 - 15    Comment: Performed at [facility], [address] [place], [place] [address]  Ethanol     Status: None   Collection Time: 02/11/19  2:45 PM  Result Value Ref Range   Alcohol, Ethyl (B) <10 <10 mg/dL    Comment: (NOTE) Lowest detectable limit for serum alcohol is 10 mg/dL. For medical purposes only. Performed at [facility], [address] [place], [place] [address]   cbc     Status: Abnormal   Collection Time: 02/11/19  2:45 PM  Result Value Ref Range   WBC 5.4 4.0 - 10.5 K/uL   RBC 3.69 (L) 4.22 - 5.81 MIL/uL   Hemoglobin 10.8 (L) 13.0 - 17.0 g/dL   HCT 31.3 (L) 39.0 - 52.0 %   MCV 84.8 80.0 - 100.0 fL   MCH 29.3 26.0 - 34.0 pg   MCHC 34.5 30.0 - 36.0 g/dL   RDW 13.9 11.5 - 15.5 %   Platelets 143 (L) 150 - 400 K/uL   nRBC 0.0 0.0 - 0.2 %    Comment: Performed at [facility], [address] [place], [place]  [address]  Urine Drug Screen, Qualitative     Status: None   Collection Time: 02/11/19  2:45 PM  Result Value Ref Range   Tricyclic, Ur Screen NONE DETECTED NONE DETECTED   Amphetamines, Ur Screen NONE DETECTED NONE DETECTED   MDMA (Ecstasy)Ur Screen NONE DETECTED NONE DETECTED   Cocaine Metabolite,Ur [place] NONE DETECTED NONE DETECTED   Opiate, Ur Screen NONE DETECTED NONE DETECTED   Phencyclidine (PCP) Ur S NONE DETECTED NONE DETECTED   Cannabinoid 50 Ng, Ur [place] NONE DETECTED NONE DETECTED   Barbiturates, Ur Screen NONE DETECTED NONE DETECTED   Benzodiazepine, Ur Scrn NONE DETECTED NONE DETECTED   Methadone Scn, Ur NONE DETECTED NONE DETECTED    Comment: (NOTE) Tricyclics + metabolites, urine    Cutoff 1000 ng/mL Amphetamines + metabolites, urine  Cutoff 1000 ng/mL MDMA (Ecstasy), urine              Cutoff 500 ng/mL Cocaine Metabolite, urine          Cutoff 300 ng/mL Opiate + metabolites, urine        Cutoff 300 ng/mL Phencyclidine (PCP), urine         Cutoff 25 ng/mL Cannabinoid, urine                 Cutoff 50 ng/mL Barbiturates + metabolites, urine  Cutoff 200 ng/mL Benzodiazepine, urine              Cutoff 200 ng/mL Methadone, urine                   Cutoff 300 ng/mL The urine drug screen provides only a preliminary, unconfirmed analytical test result and should not be used for non-medical purposes. Clinical consideration and professional judgment should be applied to any positive drug screen result due to possible interfering substances. A more specific alternate chemical method must be used in order to obtain a confirmed analytical result. Gas chromatography / mass spectrometry (GC/MS) is the preferred confirmat ory method. Performed at [facility], [address] [place], [place] [address]   Respiratory Panel by RT PCR (Flu A&B, Covid) - Nasopharyngeal Swab     Status: None   Collection Time: 02/11/19  4:40 PM   Specimen: Nasopharyngeal Swab  Result Value Ref Range  SARS Coronavirus 2 by RT PCR NEGATIVE NEGATIVE    Comment: (NOTE) SARS-CoV-2 target nucleic acids are NOT DETECTED. The SARS-CoV-2 RNA is generally detectable in upper respiratoy specimens during the acute phase of infection. The lowest concentration of SARS-CoV-2 viral copies this assay can detect is 131 copies/mL. A negative result does not preclude SARS-Cov-2 infection and should not be used as the sole basis for treatment or other patient management decisions. A negative result may occur with  improper specimen collection/handling, submission of specimen other than nasopharyngeal swab, presence of viral mutation(s) within the areas targeted by this assay, and inadequate number of viral copies (<131 copies/mL). A negative result must be combined with clinical observations, patient history, and epidemiological information. The expected result is Negative. Fact Sheet for Patients:  PinkCheek.be Fact Sheet for Healthcare Providers:  GravelBags.it This test is not yet ap proved or cleared by the [place] FDA and  has been authorized for detection and/or diagnosis of SARS-CoV-2 by FDA under an Emergency Use Authorization (EUA). This EUA will remain  in effect (meaning this test can be used) for the duration of the COVID-19 declaration under Section 564(b)(1) of the Act, 21 U.S.C. section 360bbb-3(b)(1), unless the authorization is terminated or revoked sooner.    Influenza A by PCR NEGATIVE NEGATIVE   Influenza B by PCR NEGATIVE NEGATIVE    Comment: (NOTE) The Xpert Xpress SARS-CoV-2/FLU/RSV assay is intended as an aid in  the diagnosis of influenza from Nasopharyngeal swab specimens and  should not be used as a sole basis for treatment. Nasal washings and  aspirates are unacceptable for Xpert Xpress SARS-CoV-2/FLU/RSV  testing. Fact Sheet for Patients: PinkCheek.be Fact Sheet for  Healthcare Providers: GravelBags.it This test is not yet approved or cleared by the [place] FDA and  has been authorized for detection and/or diagnosis of SARS-CoV-2 by  FDA under an Emergency Use Authorization (EUA). This EUA will remain  in effect (meaning this test can be used) for the duration of the  Covid-19 declaration under Section 564(b)(1) of the Act, 21  U.S.C. section 360bbb-3(b)(1), unless the authorization is  terminated or revoked. Performed at [facility], [address], [place], [place] [address]     Current Facility-Administered Medications  Medication Dose Route Frequency Provider Last Rate Last Admin  . atorvastatin (LIPITOR) tablet 10 mg  10 mg Oral Daily Carrie Mew, MD   10 mg at 02/11/19 1955  . gabapentin (NEURONTIN) capsule 300 mg  300 mg Oral TID Carrie Mew, MD      . lisinopril (ZESTRIL) tablet 20 mg  20 mg Oral Daily Carrie Mew, MD   20 mg at 02/11/19 1957  . [START ON 02/12/2019] metFORMIN (GLUCOPHAGE) tablet 500 mg  500 mg Oral BID WC Carrie Mew, MD      . risperiDONE (RISPERDAL) tablet 3 mg  3 mg Oral BID Carrie Mew, MD      . tamsulosin [facility]) capsule 0.4 mg  0.4 mg Oral Daily Carrie Mew, MD   0.4 mg at 02/11/19 1955  . traZODone (DESYREL) tablet 150 mg  150 mg Oral QHS Carrie Mew, MD       Current Outpatient Medications  Medication Sig Dispense Refill  . atorvastatin (LIPITOR) 10 MG tablet Take 10 mg by mouth daily.    Marland Kitchen gabapentin (NEURONTIN) 300 MG capsule Take 300 mg by mouth 3 (three) times daily.    Marland Kitchen lisinopril (ZESTRIL) 20 MG tablet Take 1 tablet (20 mg total) by mouth daily. [place]  tablet 0  . metFORMIN (GLUCOPHAGE) 500 MG tablet Take 1 tablet (500 mg total) by mouth 2 (two) times daily with a meal. 30 tablet 0  . paliperidone (INVEGA SUSTENNA) 234 MG/1.5ML SUSY injection Inject 234 mg into the muscle every 28 (twenty-eight) days. 1.8 mL 0  . risperiDONE  (RISPERDAL) 3 MG tablet Take 1 tablet (3 mg total) by mouth 2 (two) times daily. 45 tablet 0  . tamsulosin (FLOMAX) 0.4 MG CAPS capsule Take 0.4 mg by mouth daily.    . traZODone (DESYREL) 150 MG tablet Take 150 mg by mouth at bedtime.       Musculoskeletal: Strength & Muscle Tone: within normal limits Gait & Station: normal Patient leans: N/A  Psychiatric Specialty Exam: Physical Exam  Nursing note and vitals reviewed. Constitutional: He appears well-developed and well-nourished.  Respiratory: Effort normal.  Musculoskeletal:        General: Normal range of motion.     Cervical back: Normal range of motion and neck supple.  Neurological: He is alert.  Psychiatric: He has a normal mood and affect.    Review of Systems  Psychiatric/Behavioral: Positive for confusion. The patient is nervous/anxious.   All other systems reviewed and are negative.   Blood pressure 135/78, pulse (!) 119, temperature 99 F (37.2 C), temperature source Oral, resp. rate 18, height 5\' 11"  (1.803 m), weight 77.1 kg, SpO2 100 %.Body mass index is 23.71 kg/m.  General Appearance: Casual  Eye Contact:  Fair  Speech:  Slow  Volume:  Decreased  Mood:  Depressed and Euphoric  Affect:  Blunt, Congruent, Depressed and Flat  Thought Process:  Disorganized  Orientation:  Other:  Self and place  Thought Content:  Illogical, Delusions and Hallucinations: Auditory  Suicidal Thoughts:  States "maybe"  Homicidal Thoughts:  States "maybe"  Memory:  Immediate;   Poor Recent;   Poor Remote;   Poor  Judgement:  Impaired  Insight:  Lacking  Psychomotor Activity:  Decreased  Concentration:  Concentration: Poor and Attention Span: Poor  Recall:  Poor  Fund of Knowledge:  Poor  Language:  Poor  Akathisia:  Negative  Handed:  Right  AIMS (if indicated):     Assets:  Communication Skills Desire for Improvement Housing Social Support  ADL's:  Intact  Cognition:  Impaired,  Severe  Sleep:        Treatment  Plan Summary: Medication management and Plan Patient meets criteria for geriatric psychiatric inpatient admission.  Disposition: Recommend psychiatric Inpatient admission when medically cleared. Supportive therapy provided about ongoing stressors.  Caroline Sauger, NP 02/11/2019 11:21 PM

## 2019-02-11 NOTE — ED Notes (Signed)
Hourly rounding reveals patient in room. No complaints, stable, in no acute distress. Q15 minute rounds and monitoring via Rover and Officer to continue.   

## 2019-02-11 NOTE — ED Notes (Signed)
Pt wandering in room and hallway; redirected to stay in his room. Pt compliant.

## 2019-02-11 NOTE — ED Notes (Signed)
Hourly rounding reveals patient in room. No complaints, stable, in no acute distress. Q15 minute rounds and monitoring via Security Cameras to continue. 

## 2019-02-11 NOTE — ED Triage Notes (Signed)
Pt via [facility] ([place]) after leaving group home, which has been happening a lot lately. Pt has been leaving the home and wandering into the woods and into vacant homes. Pt has hx of dementia and schizophrenia. Pt states he is "afraid of" himself, and when, pressed, states he is afraid that he might hurt himself. He had suicide attempt in 1979 and more recently, he states, he tried to drown himself in a river. [facility]O states he has been found near water each time he runs off.

## 2019-02-11 NOTE — ED Notes (Signed)
[facility]O states that pt fell several times yesterday; he has some bruising to knees.
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
## 2019-02-11 NOTE — ED Notes (Signed)
1 bag of belongings: black coat with wallet in pocket (no money), striped shirt, brown pants, belt, black shoes, white socks, white briefs.

## 2019-02-11 NOTE — ED Notes (Signed)
Hourly rounding reveals patient sleeping in room. No complaints, stable, in no acute distress. Q15 minute rounds and monitoring via Security Cameras to continue. 

## 2019-02-11 NOTE — ED Notes (Signed)
Attempted to call guardian; left message on voice mail to call

## 2019-02-11 NOTE — ED Notes (Signed)
Received call from Natasha Bence, NP with pt's ACT team. She reports that she has recently begun caring for pt. For last three weeks, she reports increasing ataxia and drooling in pt. She also reports that his kidney function has been impaired as of late as well (Dec 14 labs BUN 32, Creatinine 1.64), and that he was given Invega injection yesterday of 234 mg, which she feels was way too high, based on pt's kidney function. She also reports that pt had an abdominal scan in may that showed a partial blockage (kidney?). She would like to be contacted by the psychiatrist if deemed important, but wanted to make sure we knew about the kidney function so we could potentially use medications that did not involve as much kidney excretion.   Natasha Bence, NP [phone]

## 2019-02-12 ENCOUNTER — Emergency Department: Payer: Medicaid Other

## 2019-02-12 NOTE — ED Notes (Signed)
BEHAVIORAL HEALTH ROUNDING Patient sleeping: No. Patient alert: yes Behavior appropriate: Yes.  ; If no, describe:  Nutrition and fluids offered: yes Toileting and hygiene offered: Yes  Sitter present: q15 minute observations and security camera monitoring   

## 2019-02-12 NOTE — ED Notes (Signed)
Hourly rounding reveals patient sleeping in room. No complaints, stable, in no acute distress. Q15 minute rounds and monitoring via Security Cameras to continue. 

## 2019-02-12 NOTE — ED Notes (Signed)
ED BHU Is the patient under IVC or is there intent for IVC: Yes.   Is the patient medically cleared: Yes.   Is there vacancy in the ED BHU: Yes.   Is the population mix appropriate for patient:   He is confused with a history of multiple falls  - he is a high fall risk  Is the patient awaiting placement in inpatient or outpatient setting: Yes.   Has the patient had a psychiatric consult: Yes.   Survey of unit performed for contraband, proper placement and condition of furniture, tampering with fixtures in bathroom, shower, and each patient room: Yes.  ; Findings:  APPEARANCE/BEHAVIOR Calm and cooperative NEURO ASSESSMENT Orientation: oriented to himself  Attempts made to orient him to place time and situation  Denies pain Hallucinations: No.None noted (Hallucinations)  Denies  Speech: Normal  Slow to respond at times  Gait: normal RESPIRATORY ASSESSMENT Even  Unlabored respirations  CARDIOVASCULAR ASSESSMENT Pulses equal   regular rate  Skin warm and dry   GASTROINTESTINAL ASSESSMENT no GI complaint EXTREMITIES Full ROM  PLAN OF CARE Provide calm/safe environment. Vital signs assessed twice daily. ED BHU Assessment once each 12-hour shift.  Assure the ED provider has rounded once each shift. Provide and encourage hygiene. Provide redirection as needed. Assess for escalating behavior; address immediately and inform ED provider.  Assess family dynamic and appropriateness for visitation as needed: Yes.  ; If necessary, describe findings:  Educate the patient/family about BHU procedures/visitation: Yes.  ; If necessary, describe findings:

## 2019-02-12 NOTE — Consult Note (Signed)
  Patient assessed and case discussed. Patient continues to warrant inpatient admission at this time. Patient was observed to be moving very slowly and exhibits an ataxic gait. He continues to be alert and oriented to self only, and has ongoing altered mental status. He is displaying auditory and visual hallucinations at this time, and appears to be responding to internal stimuli.  Patient presents to ER after wandering away from group home. Per previous chart review patient appears to be at or near baseline. If patient continues to wander and there are concerns about his safety may need to consider memory care unit or securing the group home so he is not able to wander so freely. He denies si/hi/avh.  Will continue to recommend inpatient hospitalization.

## 2019-02-12 NOTE — ED Notes (Signed)
Referral information for Psychiatric Hospitalization faxed to;   . [facility] ([phone]-or- [phone]),   . [facility] [phone] -or- [phone]) [phone]  . [facility] ([phone]),   . [facility] ([phone] -or- [phone]),   . [facility] ([phone] or [phone])  . [facility] ([phone] or [phone]),   . [facility] ([phone]).  . [facility] ([phone])   

## 2019-02-12 NOTE — ED Notes (Signed)
Patient observed lying in bed with eyes closed  Even, unlabored respirations observed   NAD pt appears to be sleeping  I will continue to monitor along with every 15 minute visual observations and ongoing security camera monitoring    

## 2019-02-12 NOTE — ED Notes (Signed)
Hourly rounding reveals patient awake in room. No complaints, stable, in no acute distress. Q15 minute rounds and monitoring via Security Cameras to continue. 

## 2019-02-12 NOTE — ED Notes (Signed)
Pt given sandwich tray, drink and icee

## 2019-02-12 NOTE — ED Notes (Signed)

## 2019-02-12 NOTE — BH Assessment (Signed)
Writer followed up with referrals for Psychiatric Hospitalization faxed to;    [facility] ([phone]-or- [phone]), pending review   [facility] ([phone]- [phone]), denied.   [facility] [phone]), Information faxed   [facility] ([place]), Wait List   [facility] ([phone] -[phone]), Denied due to chronicity   [facility] [phone] or [phone])   [facility] ([phone] or [phone]), information refaxed   [facility] ([phone]), pending review   [facility] [phone])

## 2019-02-13 DIAGNOSIS — R410 Disorientation, unspecified: Secondary | ICD-10-CM | POA: Diagnosis not present

## 2019-02-13 DIAGNOSIS — F200 Paranoid schizophrenia: Secondary | ICD-10-CM | POA: Diagnosis not present

## 2019-02-13 NOTE — ED Notes (Signed)
Pt. Gave me note "which he says is his parents phone #[phone].

## 2019-02-13 NOTE — ED Notes (Signed)
Meal tray provided.

## 2019-02-13 NOTE — Consult Note (Signed)
[facility] Face-to-Face Psychiatry Consult   Reason for Consult:  Wandering behaviors at his facility Referring Physician:  EDP Patient Identification: Peter Parsons MRN:  [id number] Principal Diagnosis: Paranoid schizophrenia ([place]) Diagnosis:  Principal Problem:   Paranoid schizophrenia ([place]) Active Problems:   Altered mental status  Total Time spent with patient: 30 minutes  Subjective:   [name] is a 59 y.o. male patient admitted with decline in cognition and increase in confusion.  "Not so good.  I want to be with my parents."  Patient seen and evaluated in person by this provider.  He is fixated on leaving and going to live with his parents who if alive are unable to care for him.  According to his history, both parents have dementia and his ACT team believes he has dementia--no official diagnosis.  He came from a group home after continuingly wandering outside of the home.  Continues to be confused with some paranoid behaviors at times but easily redirectable.   He appears to be at his new baseline with cognitive decline and need of a locked facility like a SNF or memory care.  HPI on admission:  Peter Parsons is a 59 y.o. male patient Presented to [facility] ED via law enforcement under involuntary commitment status (IVC).  Per the ED triage nursing note, the patient has been leaving his group home a lot lately and wandering into the woods and vacant homes. The patient has a history of dementia and schizophrenia.  The patient voiced to the ED triage nurse, he is afraid of himself, and when pressed states, he is worried that he might hurt himself.  The patient was seen face-to-face by this provider; chart reviewed and consulted with Dr. Joni Fears on 02/11/2019 due to the patient's care. It was discussed with the EDP that the patient does meet criteria to be admitted to the geriatric psychiatric inpatient unit.  On evaluation, the patient is alert, confused,  calm, and cooperative and mood-congruent  with affect. The patient does not appear to be responding to internal or external stimuli. The patient is presenting with delusional thinking. The patient is unable to answer if he is experiencing auditory or visual hallucinations. The patient denies suicidal, homicidal, or self-harm ideations. The patient is presenting with psychotic and paranoid behaviors. During an encounter with the patient, he was unable to answer questions appropriately.  Plan: The patient is a safety risk to self and does require geriatric psychiatric inpatient admission for stabilization and treatment.  HPI: Per Dr. Joni Fears; Peter Parsons a 59 y.o.malewith a history of diabetes hypertension and schizophrenia who is brought to the ED under involuntary commitment from his group home due to wandering off into the woods multiple times. Patient reports suicidal thoughts, with ideas of drowning himself. While enforcement noted that patient has been found by bodies of water multiple times when he wanders away from the group home. Patient denies any acute complaints.He denies hallucinations. Denies HI.  Past Psychiatric History: schizophrenia  Risk to Self: None Risk to Others: Homicidal Ideation: No Thoughts of Harm to Others: No Current Homicidal Intent: No Current Homicidal Plan: No Access to Homicidal Means: No Identified Victim: Reports of none History of harm to others?: No Assessment of Violence: None Noted Violent Behavior Description: Reports of none Does patient have access to weapons?: No Criminal Charges Pending?: No Describe Pending Criminal Charges: Reports of none Does patient have a court date: No Prior Inpatient Therapy: Prior Inpatient Therapy: Yes Prior Therapy Dates: 01/2019, 12/2018 & 05/2018 Prior  Therapy Facilty/Provider(s): [facility] [place] Reason for Treatment: Schizophrenia Prior Outpatient Therapy: Prior Outpatient Therapy: Yes Prior Therapy Dates:  Current Prior Therapy Facilty/Provider(s): [facility] Reason for Treatment: Schizophrenia([facility]) Does patient have an ACCT team?: Yes Does patient have Intensive In-House Services?  : No Does patient have [facility] services? : No Does patient have P4CC services?: No  Past Medical History:  Past Medical History:  Diagnosis Date  . Diabetes mellitus without complication ([place])   . Hypertension   . Schizophrenia ([place])    History reviewed. No pertinent surgical history. Family History: History reviewed. No pertinent family history. Family Psychiatric  History: none Social History:  Social History   Substance and Sexual Activity  Alcohol Use Not Currently     Social History   Substance and Sexual Activity  Drug Use Not Currently    Social History   Socioeconomic History  . Marital status: Single    Spouse name: Not on file  . Number of children: Not on file  . Years of education: Not on file  . Highest education level: Not on file  Occupational History  . Not on file  Tobacco Use  . Smoking status: Former Research scientist (life sciences)  . Smokeless tobacco: Never Used  Substance and Sexual Activity  . Alcohol use: Not Currently  . Drug use: Not Currently  . Sexual activity: Not on file  Other Topics Concern  . Not on file  Social History Narrative  . Not on file   Social Determinants of Health   Financial Resource Strain:   . Difficulty of Paying Living Expenses: Not on file  Food Insecurity:   . Worried About Charity fundraiser in the Last Year: Not on file  . Ran Out of Food in the Last Year: Not on file  Transportation Needs:   . Lack of Transportation (Medical): Not on file  . Lack of Transportation (Non-Medical): Not on file  Physical Activity:   . Days of Exercise per Week: Not on file  . Minutes of Exercise per Session: Not on file  Stress:   . Feeling of Stress : Not on file  Social Connections:   . Frequency of Communication with  Friends and Family: Not on file  . Frequency of Social Gatherings with Friends and Family: Not on file  . Attends Religious Services: Not on file  . Active Member of Clubs or Organizations: Not on file  . Attends Archivist Meetings: Not on file  . Marital Status: Not on file   Additional Social History:    Allergies:   Allergies  Allergen Reactions  . Acetaminophen Other (See Comments)    Unknown Unable to breathe Chest  tightness/SOB     Labs:  Results for orders placed or performed during the hospital encounter of 02/11/19 (from the past 48 hour(s))  Comprehensive metabolic panel     Status: Abnormal   Collection Time: 02/11/19  2:45 PM  Result Value Ref Range   Sodium 140 135 - 145 mmol/L   Potassium 3.9 3.5 - 5.1 mmol/L   Chloride 103 98 - 111 mmol/L   CO2 27 22 - 32 mmol/L   Glucose, Bld 100 (H) 70 - 99 mg/dL   BUN 44 (H) 6 - 20 mg/dL   Creatinine, Ser 1.99 (H) 0.61 - 1.24 mg/dL   Calcium 9.3 8.9 - 10.3 mg/dL   Total Protein 7.9 6.5 - 8.1 g/dL   Albumin 4.7 3.5 - 5.0  g/dL   AST 26 15 - 41 U/L   ALT 16 0 - 44 U/L   Alkaline Phosphatase 86 38 - 126 U/L   Total Bilirubin 0.8 0.3 - 1.2 mg/dL   GFR calc non Af Amer 35 (L) >60 mL/min   GFR calc Af Amer 41 (L) >60 mL/min   Anion gap 10 5 - 15    Comment: Performed at [facility], [address] [place], [place] [address]  Ethanol     Status: None   Collection Time: 02/11/19  2:45 PM  Result Value Ref Range   Alcohol, Ethyl (B) <10 <10 mg/dL    Comment: (NOTE) Lowest detectable limit for serum alcohol is 10 mg/dL. For medical purposes only. Performed at [facility], [address] [place], [place] [address]   cbc     Status: Abnormal   Collection Time: 02/11/19  2:45 PM  Result Value Ref Range   WBC 5.4 4.0 - 10.5 K/uL   RBC 3.69 (L) 4.22 - 5.81 MIL/uL   Hemoglobin 10.8 (L) 13.0 - 17.0 g/dL   HCT 31.3 (L) 39.0 - 52.0 %   MCV 84.8 80.0 - 100.0 fL   MCH 29.3 26.0 - 34.0 pg    MCHC 34.5 30.0 - 36.0 g/dL   RDW 13.9 11.5 - 15.5 %   Platelets 143 (L) 150 - 400 K/uL   nRBC 0.0 0.0 - 0.2 %    Comment: Performed at [facility], [address], [place], [place] [address]  Urine Drug Screen, Qualitative     Status: None   Collection Time: 02/11/19  2:45 PM  Result Value Ref Range   Tricyclic, Ur Screen NONE DETECTED NONE DETECTED   Amphetamines, Ur Screen NONE DETECTED NONE DETECTED   MDMA (Ecstasy)Ur Screen NONE DETECTED NONE DETECTED   Cocaine Metabolite,Ur [place] NONE DETECTED NONE DETECTED   Opiate, Ur Screen NONE DETECTED NONE DETECTED   Phencyclidine (PCP) Ur S NONE DETECTED NONE DETECTED   Cannabinoid 50 Ng, Ur [place] NONE DETECTED NONE DETECTED   Barbiturates, Ur Screen NONE DETECTED NONE DETECTED   Benzodiazepine, Ur Scrn NONE DETECTED NONE DETECTED   Methadone Scn, Ur NONE DETECTED NONE DETECTED    Comment: (NOTE) Tricyclics + metabolites, urine    Cutoff 1000 ng/mL Amphetamines + metabolites, urine  Cutoff 1000 ng/mL MDMA (Ecstasy), urine              Cutoff 500 ng/mL Cocaine Metabolite, urine          Cutoff 300 ng/mL Opiate + metabolites, urine        Cutoff 300 ng/mL Phencyclidine (PCP), urine         Cutoff 25 ng/mL Cannabinoid, urine                 Cutoff 50 ng/mL Barbiturates + metabolites, urine  Cutoff 200 ng/mL Benzodiazepine, urine              Cutoff 200 ng/mL Methadone, urine                   Cutoff 300 ng/mL The urine drug screen provides only a preliminary, unconfirmed analytical test result and should not be used for non-medical purposes. Clinical consideration and professional judgment should be applied to any positive drug screen result due to possible interfering substances. A more specific alternate chemical method must be used in order to obtain a confirmed analytical result. Gas chromatography / mass spectrometry (GC/MS) is the preferred [employer]  ory method. Performed at [facility], [address],  [place], [place] [address]   Respiratory Panel by RT PCR (Flu A&B, Covid) - Nasopharyngeal Swab     Status: None   Collection Time: 02/11/19  4:40 PM   Specimen: Nasopharyngeal Swab  Result Value Ref Range   SARS Coronavirus 2 by RT PCR NEGATIVE NEGATIVE    Comment: (NOTE) SARS-CoV-2 target nucleic acids are NOT DETECTED. The SARS-CoV-2 RNA is generally detectable in upper respiratoy specimens during the acute phase of infection. The lowest concentration of SARS-CoV-2 viral copies this assay can detect is 131 copies/mL. A negative result does not preclude SARS-Cov-2 infection and should not be used as the sole basis for treatment or other patient management decisions. A negative result may occur with  improper specimen collection/handling, submission of specimen other than nasopharyngeal swab, presence of viral mutation(s) within the areas targeted by this assay, and inadequate number of viral copies (<131 copies/mL). A negative result must be combined with clinical observations, patient history, and epidemiological information. The expected result is Negative. Fact Sheet for Patients:  PinkCheek.be Fact Sheet for Healthcare Providers:  GravelBags.it This test is not yet ap proved or cleared by the [place] FDA and  has been authorized for detection and/or diagnosis of SARS-CoV-2 by FDA under an Emergency Use Authorization (EUA). This EUA will remain  in effect (meaning this test can be used) for the duration of the COVID-19 declaration under Section 564(b)(1) of the Act, 21 U.S.C. section 360bbb-3(b)(1), unless the authorization is terminated or revoked sooner.    Influenza A by PCR NEGATIVE NEGATIVE   Influenza B by PCR NEGATIVE NEGATIVE    Comment: (NOTE) The Xpert Xpress SARS-CoV-2/FLU/RSV assay is intended as an aid in  the diagnosis of influenza from Nasopharyngeal swab specimens and  should not be used as a sole  basis for treatment. Nasal washings and  aspirates are unacceptable for Xpert Xpress SARS-CoV-2/FLU/RSV  testing. Fact Sheet for Patients: PinkCheek.be Fact Sheet for Healthcare Providers: GravelBags.it This test is not yet approved or cleared by the [place] FDA and  has been authorized for detection and/or diagnosis of SARS-CoV-2 by  FDA under an Emergency Use Authorization (EUA). This EUA will remain  in effect (meaning this test can be used) for the duration of the  Covid-19 declaration under Section 564(b)(1) of the Act, 21  U.S.C. section 360bbb-3(b)(1), unless the authorization is  terminated or revoked. Performed at [facility], [address] [place], [place] [address]     Current Facility-Administered Medications  Medication Dose Route Frequency Provider Last Rate Last Admin  . atorvastatin (LIPITOR) tablet 10 mg  10 mg Oral Daily Carrie Mew, MD   10 mg at 02/12/19 1025  . gabapentin (NEURONTIN) capsule 300 mg  300 mg Oral TID Carrie Mew, MD   300 mg at 02/12/19 2147  . lisinopril (ZESTRIL) tablet 20 mg  20 mg Oral Daily Carrie Mew, MD   20 mg at 02/12/19 1025  . metFORMIN (GLUCOPHAGE) tablet 500 mg  500 mg Oral BID WC Carrie Mew, MD   500 mg at 02/12/19 1736  . risperiDONE (RISPERDAL) tablet 3 mg  3 mg Oral BID Carrie Mew, MD   3 mg at 02/12/19 2147  . tamsulosin (FLOMAX) capsule 0.4 mg  0.4 mg Oral Daily Carrie Mew, MD   0.4 mg at 02/12/19 1025  . traZODone (DESYREL) tablet 150 mg  150 mg Oral QHS Carrie Mew, MD   150 mg  at 02/12/19 2148   Current Outpatient Medications  Medication Sig Dispense Refill  . atorvastatin (LIPITOR) 10 MG tablet Take 10 mg by mouth daily.    Marland Kitchen gabapentin (NEURONTIN) 300 MG capsule Take 300 mg by mouth 3 (three) times daily.    Marland Kitchen lisinopril (ZESTRIL) 20 MG tablet Take 1 tablet (20 mg total) by mouth daily. 30 tablet 0  .  metFORMIN (GLUCOPHAGE) 500 MG tablet Take 1 tablet (500 mg total) by mouth 2 (two) times daily with a meal. 30 tablet 0  . paliperidone (INVEGA SUSTENNA) 234 MG/1.5ML SUSY injection Inject 234 mg into the muscle every 28 (twenty-eight) days. 1.8 mL 0  . risperiDONE (RISPERDAL) 3 MG tablet Take 1 tablet (3 mg total) by mouth 2 (two) times daily. 45 tablet 0  . tamsulosin (FLOMAX) 0.4 MG CAPS capsule Take 0.4 mg by mouth daily.    . traZODone (DESYREL) 150 MG tablet Take 150 mg by mouth at bedtime.       Musculoskeletal: Strength & Muscle Tone: within normal limits Gait & Station: normal Patient leans: N/A  Psychiatric Specialty Exam: Physical Exam  Nursing note and vitals reviewed. Constitutional: He is oriented to person, place, and time. He appears well-developed and well-nourished.  HENT:  Head: Normocephalic.  Respiratory: Effort normal.  Musculoskeletal:        General: Normal range of motion.     Cervical back: Normal range of motion.  Neurological: He is alert and oriented to person, place, and time.  Psychiatric: His mood appears anxious. His affect is blunt. His speech is tangential. Thought content is delusional. Cognition and memory are impaired. He expresses inappropriate judgment. He is inattentive.    Review of Systems  Psychiatric/Behavioral: Positive for confusion. The patient is nervous/anxious.   All other systems reviewed and are negative.   Blood pressure 114/84, pulse (!) 111, temperature 98.6 F (37 C), temperature source Oral, resp. rate 18, height 5\' 11"  (1.803 m), weight 77.1 kg, SpO2 99 %.Body mass index is 23.71 kg/m.  General Appearance: Casual  Eye Contact:  Fair  Speech:  Normal Rate  Volume:  Normal  Mood:  Anxious  Affect:  Blunt  Thought Process:  Goal Directed  Orientation:  Full (Time, Place, and Person)  Thought Content:  Delusions, confused at times  Suicidal Thoughts:  No  Homicidal Thoughts:  No  Memory:  Immediate;   Poor Recent;    Poor Remote;   Poor  Judgement:  Impaired  Insight:  Lacking  Psychomotor Activity:  Normal  Concentration:  Concentration: Poor and Attention Span: Poor  Recall:  Poor  Fund of Knowledge:  Fair  Language:  Fair  Akathisia:  No  Handed:  Right  AIMS (if indicated):     Assets:  Leisure Time Resilience  ADL's:  Intact  Cognition:  Impaired,  Mild  Sleep:      59 year old male admitted for wandering away from his group home with concerns about him having dementia, related to his behavior and both parents having dementia.  He is pleasantly confused and anxious at times, fixated today on thinking he can leave and go live with his parents.  History of schizophrenia.  Continue to monitor for 24 hours for stability.  Treatment Plan Summary: Daily contact with patient to assess and evaluate symptoms and progress in treatment, Medication management and Plan schizophrenia,   -Continue Risperdal 3 mg BID -Continue monthly Invega injection  Anxiety: -Continue gabapentin 300 mg TID  Insomnia: -Continue Trazodone 150 mg  at bedtime  Disposition: Recommend psychiatric Inpatient admission when medically cleared.  Waylan Boga, NP 02/13/2019 9:04 AM

## 2019-02-13 NOTE — ED Notes (Signed)
Pt. Currently walking around in the unit.  Pt. Continues to come to nursing station, but has no questions when asked.  Continue to monitor.

## 2019-02-13 NOTE — ED Notes (Signed)
Gave pt. tv remote and helped pt. Find channel to watch.

## 2019-02-14 DIAGNOSIS — R410 Disorientation, unspecified: Secondary | ICD-10-CM | POA: Diagnosis not present

## 2019-02-14 DIAGNOSIS — F200 Paranoid schizophrenia: Secondary | ICD-10-CM

## 2019-02-14 NOTE — ED Notes (Signed)
He is lying in bed - eyes open  NAD observed  No verbalized needs or concerns

## 2019-02-14 NOTE — ED Notes (Signed)
Pt. Up using bathroom, pt. Returned to room #4 with steady gait.

## 2019-02-14 NOTE — ED Notes (Signed)
BEHAVIORAL HEALTH ROUNDING Patient sleeping: No. Patient alert : yes Behavior appropriate: Yes.  ; If no, describe:  Nutrition and fluids offered: yes Toileting and hygiene offered: Yes  Sitter present: q15 minute observations and security camera monitoring    ENVIRONMENTAL ASSESSMENT Potentially harmful objects out of patient reach: Yes.   Personal belongings secured: Yes.   Patient dressed in hospital provided attire only: Yes.   Plastic bags out of patient reach: Yes.   Patient care equipment (cords, cables, call bells, lines, and drains) shortened, removed, or accounted for: Yes.   Equipment and supplies removed from bottom of stretcher: Yes.   Potentially toxic materials out of patient reach: Yes.   Sharps container removed or out of patient reach: Yes.   

## 2019-02-14 NOTE — ED Notes (Signed)
Hourly rounding reveals patient awake in room. No complaints, stable, in no acute distress. Q15 minute rounds and monitoring via Security Cameras to continue. 

## 2019-02-14 NOTE — BH Assessment (Addendum)
Writer spoke with patient to complete updated/reassessment. Patient stared at Probation officer, when asked questions.

## 2019-02-14 NOTE — ED Notes (Signed)
Pt showered, new scrubs and clean sheets.  lw edt

## 2019-02-14 NOTE — ED Notes (Signed)
Pt. Up using bathroom. 

## 2019-02-14 NOTE — ED Notes (Signed)
ED BHU [place] Is the patient under IVC or is there intent for IVC: Yes.   Is the patient medically cleared: Yes.   Is there vacancy in the ED BHU: Yes.   Is the population mix appropriate for patient: Yes.   Is the patient awaiting placement in inpatient or outpatient setting: Yes.   He is awaiting inpatient placement Has the patient had a psychiatric consult: Yes.   Survey of unit performed for contraband, proper placement and condition of furniture, tampering with fixtures in bathroom, shower, and each patient room: Yes.  ; Findings:  APPEARANCE/BEHAVIOR Calm and cooperative NEURO ASSESSMENT Orientation: oriented to self  Attempts made to orient him to time, place and situation  Denies pain Hallucinations: No.None noted (Hallucinations) denies at this time Speech: Normal Gait: normal RESPIRATORY ASSESSMENT Even  Unlabored respirations  CARDIOVASCULAR ASSESSMENT Pulses equal   regular rate  Skin warm and dry   GASTROINTESTINAL ASSESSMENT no GI complaint EXTREMITIES Full ROM  PLAN OF CARE Provide calm/safe environment. Vital signs assessed twice daily. ED BHU Assessment once each 12-hour shift. Assure the ED provider has rounded once each shift. Provide and encourage hygiene. Provide redirection as needed. Assess for escalating behavior; address immediately and inform ED provider.  Assess family dynamic and appropriateness for visitation as needed: Yes.  ; If necessary, describe findings:  Educate the patient/family about BHU procedures/visitation: Yes.  ; If necessary, describe findings:

## 2019-02-14 NOTE — ED Notes (Signed)
Hourly rounding reveals patient sleeping in room. No complaints, stable, in no acute distress. Q15 minute rounds and monitoring via Security Cameras to continue. 

## 2019-02-14 NOTE — BH Assessment (Signed)
Writer spoke with patient to complete updated/reassessment. Patient continue to be confused. When asked questions, it appeared he was trying to answer but didn't say anything.

## 2019-02-14 NOTE — ED Notes (Signed)
BEHAVIORAL HEALTH ROUNDING Patient sleeping: No. Patient alert: yes Behavior appropriate: Yes.  ; If no, describe:  Nutrition and fluids offered: yes Toileting and hygiene offered: Yes  Sitter present: q15 minute observations and security camera monitoring   

## 2019-02-14 NOTE — ED Notes (Signed)
Report to include Situation, Background, Assessment, and Recommendations received from Susan RN. Patient alert and oriented, warm and dry, in no acute distress. Patient denies SI, HI, AVH and pain. Patient made aware of Q15 minute rounds and security cameras for their safety. Patient instructed to come to me with needs or concerns. 

## 2019-02-14 NOTE — ED Notes (Signed)
BEHAVIORAL HEALTH ROUNDING Patient sleeping: Yes.   Patient alert and oriented: eyes closed  Appears asleep Behavior appropriate: Yes.  ; If no, describe:  Nutrition and fluids offered: Yes  Toileting and hygiene offered: sleeping Sitter present: q 15 minute observations and security camera monitoring  

## 2019-02-14 NOTE — Consult Note (Signed)
[facility] Face-to-Face Psychiatry Consult   Reason for Consult:  Wandering behaviors at his facility Referring Physician:  EDP Patient Identification: Peter Parsons MRN:  [id number] Principal Diagnosis: Paranoid schizophrenia ([place]) Diagnosis:  Principal Problem:   Paranoid schizophrenia ([place]) Active Problems:   Altered mental status  Total Time spent with patient: 30 minutes  Subjective:   Peter Parsons is a 59 y.o. male patient admitted with decline in cognition and increase in confusion.  "I think I am going to hell."  Patient seen and evaluated in person by this provider.  Continues to be confused  but easily redirectable.   He appears to be at his new baseline with cognitive decline and need of a locked facility like a SNF or memory care.  He is not responding to internal stimuli and denies hallucinations, no suicidal or homicidal ideations.  His ACT team along with his facility believes Peter Parsons has dementia related to his behaviors and declining cognition along with his parents both have a dementia.  He no longer can be safe at the group home as he continues to wander away prompting a threat to himself.  Social work consult placed for memory care or SNF or other locked facility for safety purposes.  HPI on admission:  Peter Parsons is a 59 y.o. male patient Presented to [facility] ED via law enforcement under involuntary commitment status (IVC).  Per the ED triage nursing note, the patient has been leaving his group home a lot lately and wandering into the woods and vacant homes. The patient has a history of dementia and schizophrenia.  The patient voiced to the ED triage nurse, he is afraid of himself, and when pressed states, he is worried that he might hurt himself.  The patient was seen face-to-face by this provider; chart reviewed and consulted with Dr. Joni Fears on 02/11/2019 due to the patient's care. It was discussed with the EDP that the patient does meet criteria to be admitted to the geriatric  psychiatric inpatient unit.  On evaluation, the patient is alert, confused,  calm, and cooperative and mood-congruent with affect. The patient does not appear to be responding to internal or external stimuli. The patient is presenting with delusional thinking. The patient is unable to answer if he is experiencing auditory or visual hallucinations. The patient denies suicidal, homicidal, or self-harm ideations. The patient is presenting with psychotic and paranoid behaviors. During an encounter with the patient, he was unable to answer questions appropriately.  Plan: The patient is a safety risk to self and does require geriatric psychiatric inpatient admission for stabilization and treatment.  HPI: Per Dr. Joni Fears; Peter Parsons a 59 y.o.malewith a history of diabetes hypertension and schizophrenia who is brought to the ED under involuntary commitment from his group home due to wandering off into the woods multiple times. Patient reports suicidal thoughts, with ideas of drowning himself. While enforcement noted that patient has been found by bodies of water multiple times when he wanders away from the group home. Patient denies any acute complaints.He denies hallucinations. Denies HI.  Past Psychiatric History: schizophrenia  Risk to Self: None Risk to Others: Homicidal Ideation: No Thoughts of Harm to Others: No Current Homicidal Intent: No Current Homicidal Plan: No Access to Homicidal Means: No Identified Victim: Reports of none History of harm to others?: No Assessment of Violence: None Noted Violent Behavior Description: Reports of none Does patient have access to weapons?: No Criminal Charges Pending?: No Describe Pending Criminal Charges: Reports of none Does patient have  a court date: No Prior Inpatient Therapy: Prior Inpatient Therapy: Yes Prior Therapy Dates: 01/2019, 12/2018 & 05/2018 Prior Therapy Facilty/Provider(s): [facility] [place] Reason  for Treatment: Schizophrenia Prior Outpatient Therapy: Prior Outpatient Therapy: Yes Prior Therapy Dates: Current Prior Therapy Facilty/Provider(s): [facility] Reason for Treatment: Schizophrenia([facility]) Does patient have an ACCT team?: Yes Does patient have Intensive In-House Services?  : No Does patient have [facility] services? : No Does patient have P4CC services?: No  Past Medical History:  Past Medical History:  Diagnosis Date  . Diabetes mellitus without complication ([place])   . Hypertension   . Schizophrenia ([place])    History reviewed. No pertinent surgical history. Family History: History reviewed. No pertinent family history. Family Psychiatric  History: none Social History:  Social History   Substance and Sexual Activity  Alcohol Use Not Currently     Social History   Substance and Sexual Activity  Drug Use Not Currently    Social History   Socioeconomic History  . Marital status: Single    Spouse name: Not on file  . Number of children: Not on file  . Years of education: Not on file  . Highest education level: Not on file  Occupational History  . Not on file  Tobacco Use  . Smoking status: Former Research scientist (life sciences)  . Smokeless tobacco: Never Used  Substance and Sexual Activity  . Alcohol use: Not Currently  . Drug use: Not Currently  . Sexual activity: Not on file  Other Topics Concern  . Not on file  Social History Narrative  . Not on file   Social Determinants of Health   Financial Resource Strain:   . Difficulty of Paying Living Expenses: Not on file  Food Insecurity:   . Worried About Charity fundraiser in the Last Year: Not on file  . Ran Out of Food in the Last Year: Not on file  Transportation Needs:   . Lack of Transportation (Medical): Not on file  . Lack of Transportation (Non-Medical): Not on file  Physical Activity:   . Days of Exercise per Week: Not on file  . Minutes of Exercise per Session: Not on file   Stress:   . Feeling of Stress : Not on file  Social Connections:   . Frequency of Communication with Friends and Family: Not on file  . Frequency of Social Gatherings with Friends and Family: Not on file  . Attends Religious Services: Not on file  . Active Member of Clubs or Organizations: Not on file  . Attends Archivist Meetings: Not on file  . Marital Status: Not on file   Additional Social History:    Allergies:   Allergies  Allergen Reactions  . Acetaminophen Other (See Comments)    Unknown Unable to breathe Chest  tightness/SOB     Labs:  No results found for this or any previous visit (from the past 48 hour(s)).  Current Facility-Administered Medications  Medication Dose Route Frequency Provider Last Rate Last Admin  . atorvastatin (LIPITOR) tablet 10 mg  10 mg Oral Daily Carrie Mew, MD   10 mg at 02/14/19 0940  . gabapentin (NEURONTIN) capsule 300 mg  300 mg Oral TID Carrie Mew, MD   300 mg at 02/14/19 0940  . lisinopril (ZESTRIL) tablet 20 mg  20 mg Oral Daily Carrie Mew, MD   20 mg at 02/14/19 0940  . metFORMIN (GLUCOPHAGE) tablet 500 mg  500 mg Oral BID  WC Carrie Mew, MD   500 mg at 02/14/19 0940  . risperiDONE (RISPERDAL) tablet 3 mg  3 mg Oral BID Carrie Mew, MD   3 mg at 02/14/19 0940  . tamsulosin (FLOMAX) capsule 0.4 mg  0.4 mg Oral Daily Carrie Mew, MD   0.4 mg at 02/14/19 0940  . traZODone (DESYREL) tablet 150 mg  150 mg Oral QHS Carrie Mew, MD   150 mg at 02/13/19 2121   Current Outpatient Medications  Medication Sig Dispense Refill  . atorvastatin (LIPITOR) 10 MG tablet Take 10 mg by mouth daily.    Marland Kitchen gabapentin (NEURONTIN) 300 MG capsule Take 300 mg by mouth 3 (three) times daily.    Marland Kitchen lisinopril (ZESTRIL) 20 MG tablet Take 1 tablet (20 mg total) by mouth daily. 30 tablet 0  . metFORMIN (GLUCOPHAGE) 500 MG tablet Take 1 tablet (500 mg total) by mouth 2 (two) times daily with a meal. 30 tablet 0   . paliperidone (INVEGA SUSTENNA) 234 MG/1.5ML SUSY injection Inject 234 mg into the muscle every 28 (twenty-eight) days. 1.8 mL 0  . risperiDONE (RISPERDAL) 3 MG tablet Take 1 tablet (3 mg total) by mouth 2 (two) times daily. 45 tablet 0  . tamsulosin (FLOMAX) 0.4 MG CAPS capsule Take 0.4 mg by mouth daily.    . traZODone (DESYREL) 150 MG tablet Take 150 mg by mouth at bedtime.       Musculoskeletal: Strength & Muscle Tone: within normal limits Gait & Station: normal Patient leans: N/A  Psychiatric Specialty Exam: Physical Exam  Nursing note and vitals reviewed. Constitutional: He is oriented to person, place, and time. He appears well-developed and well-nourished.  HENT:  Head: Normocephalic.  Respiratory: Effort normal.  Musculoskeletal:        General: Normal range of motion.     Cervical back: Normal range of motion.  Neurological: He is alert and oriented to person, place, and time.  Psychiatric: His speech is normal and behavior is normal. His mood appears anxious. His affect is blunt. Thought content is delusional. Cognition and memory are impaired. He expresses inappropriate judgment.    Review of Systems  Psychiatric/Behavioral: Positive for confusion. The patient is nervous/anxious.   All other systems reviewed and are negative.   Blood pressure (!) 144/86, pulse (!) 101, temperature 98.7 F (37.1 C), temperature source Oral, resp. rate 17, height 5\' 11"  (1.803 m), weight 77.1 kg, SpO2 98 %.Body mass index is 23.71 kg/m.  General Appearance: Casual  Eye Contact:  Fair  Speech:  Normal Rate  Volume:  Normal  Mood:  Anxious  Affect:  Blunt  Thought Process: Confused at times  Orientation:  Full (Time, Place, and Person)  Thought Content:  confused at times  Suicidal Thoughts:  No  Homicidal Thoughts:  No  Memory:  Immediate;   Poor Recent;   Poor Remote;   Poor  Judgement:  Impaired  Insight:  Lacking  Psychomotor Activity:  Normal  Concentration:   Concentration: Poor and Attention Span: Poor  Recall:  Poor  Fund of Knowledge:  Fair  Language:  Fair  Akathisia:  No  Handed:  Right  AIMS (if indicated):     Assets:  Leisure Time Resilience  ADL's:  Intact  Cognition:  Impaired,  Mild  Sleep:      59 year old male admitted for wandering away from his group home with concerns about him having dementia, related to his behavior and both parents having dementia.  History of schizophrenia.  Appears  to be at his new baseline with placement into a locked facility necessary to maintain safety for wandering behaviors.  Treatment Plan Summary: Daily contact with patient to assess and evaluate symptoms and progress in treatment, Medication management and Plan schizophrenia,   -Continue Risperdal 3 mg BID -Continue monthly Invega injection -Social work consult placed for placement to a locked facility, SNF, or memory care unit  Anxiety: -Continue gabapentin 300 mg TID  Insomnia: -Continue Trazodone 150 mg at bedtime  Disposition: Locked facility, SNF, or memory care unit  Waylan Boga, NP 02/14/2019 2:21 PM

## 2019-02-14 NOTE — ED Provider Notes (Signed)
-----------------------------------------   6:01 AM on 02/14/2019 -----------------------------------------   Blood pressure 115/77, pulse 88, temperature 99.1 F (37.3 C), temperature source Oral, resp. rate 16, height 1.803 m (5\' 11" ), weight 77.1 kg, SpO2 97 %.  The patient is calm and cooperative at this time.  There have been no acute events since the last update.  Awaiting disposition plan from Behavioral Medicine and/or Social Work team(s).   Hinda Kehr, MD 02/14/19 [phone]

## 2019-02-15 MED ORDER — CLONAZEPAM 0.5 MG PO TABS
0.5000 mg | ORAL_TABLET | Freq: Two times a day (BID) | ORAL | Status: DC
  Administered 2019-02-15 – 2019-03-01 (×29): 0.5 mg via ORAL
  Filled 2019-02-15 (×29): qty 1

## 2019-02-15 NOTE — ED Notes (Signed)
Hourly rounding reveals patient sleeping in room. No complaints, stable, in no acute distress. Q15 minute rounds and monitoring via Security Cameras to continue. 

## 2019-02-15 NOTE — ED Notes (Signed)
BEHAVIORAL HEALTH ROUNDING Patient sleeping: No. Patient alert: yes Behavior appropriate: Yes.  ; If no, describe:  Nutrition and fluids offered: yes Toileting and hygiene offered: Yes  Sitter present: q15 minute observations and security camera monitoring   

## 2019-02-15 NOTE — ED Notes (Signed)
Patient observed lying in bed with eyes closed  Even, unlabored respirations observed   NAD pt appears to be sleeping  I will continue to monitor along with every 15 minute visual observations and ongoing security camera monitoring    

## 2019-02-15 NOTE — ED Notes (Signed)
Hourly rounding reveals patient in room. No complaints, stable, in no acute distress. Q15 minute rounds and monitoring via Security Cameras to continue. 

## 2019-02-15 NOTE — ED Notes (Signed)
BEHAVIORAL HEALTH ROUNDING Patient sleeping: No. Patient aert: yes Behavior appropriate: Yes.  ; If no, describe:  Nutrition and fluids offered: yes Toileting and hygiene offered: Yes  Sitter present: q15 minute observations and security camera monitoring

## 2019-02-15 NOTE — ED Provider Notes (Signed)
-----------------------------------------   9:13 AM on 02/15/2019 -----------------------------------------   Blood pressure (!) 142/82, pulse (!) 103, temperature 98.7 F (37.1 C), temperature source Oral, resp. rate 18, height 5\' 11"  (1.803 m), weight 77.1 kg, SpO2 98 %.  The patient is calm and cooperative at this time.  There have been no acute events since the last update.  Awaiting disposition plan from Behavioral Medicine and/or Social Work team(s).   Arta Silence, MD 02/15/19 [phone]

## 2019-02-15 NOTE — ED Notes (Signed)
IVC/Pending Placement 

## 2019-02-15 NOTE — ED Notes (Signed)
Report to include Situation, Background, Assessment, and Recommendations received from Amy RN. Patient alert and disoriented X 2, warm and dry, in no acute distress. Patient denies SI, HI, AVH and pain. Patient made aware of Q15 minute rounds and security cameras for their safety. Patient instructed to come to me with needs or concerns.

## 2019-02-15 NOTE — ED Notes (Signed)
BEHAVIORAL HEALTH ROUNDING Patient sleeping: No. Patient alert : yes Behavior appropriate: Yes.  ; If no, describe:  Nutrition and fluids offered: yes Toileting and hygiene offered: Yes  Sitter present: q15 minute observations and security camera monitoring    ENVIRONMENTAL ASSESSMENT Potentially harmful objects out of patient reach: Yes.   Personal belongings secured: Yes.   Patient dressed in hospital provided attire only: Yes.   Plastic bags out of patient reach: Yes.   Patient care equipment (cords, cables, call bells, lines, and drains) shortened, removed, or accounted for: Yes.   Equipment and supplies removed from bottom of stretcher: Yes.   Potentially toxic materials out of patient reach: Yes.   Sharps container removed or out of patient reach: Yes.   

## 2019-02-15 NOTE — ED Notes (Signed)
ED BHU Is the patient under IVC or is there intent for IVC: Yes.   Is the patient medically cleared: Yes.   Is there vacancy in the ED BHU: Yes.   Is the population mix appropriate for patient: Yes.   Is the patient awaiting placement in inpatient or outpatient setting: Yes.  Awaiting placement   Has the patient had a psychiatric consult: Yes.   Survey of unit performed for contraband, proper placement and condition of furniture, tampering with fixtures in bathroom, shower, and each patient room: Yes.  ; Findings:  APPEARANCE/BEHAVIOR Calm and cooperative NEURO ASSESSMENT Orientation: oriented to self   Attempt made to orient him to time, place and situation  Denies pain Hallucinations: No.None noted (Hallucinations)  Denies  Speech: Normal Gait: normal RESPIRATORY ASSESSMENT Even  Unlabored respirations  CARDIOVASCULAR ASSESSMENT Pulses equal   regular rate  Skin warm and dry   GASTROINTESTINAL ASSESSMENT no GI complaint EXTREMITIES Full ROM  PLAN OF CARE Provide calm/safe environment. Vital signs assessed twice daily. ED BHU Assessment once each 12-hour shift. Assure the ED provider has rounded once each shift. Provide and encourage hygiene. Provide redirection as needed. Assess for escalating behavior; address immediately and inform ED provider.  Assess family dynamic and appropriateness for visitation as needed: Yes.  ; If necessary, describe findings:  Educate the patient/family about BHU procedures/visitation: Yes.  ; If necessary, describe findings:

## 2019-02-15 NOTE — ED Notes (Signed)
Tabitha from [facility] called and reported that he had a Divito on his left side since May.

## 2019-02-15 NOTE — BH Assessment (Signed)
Legal Guardian:  [name] ([place])

## 2019-02-15 NOTE — Care Management (Addendum)
RN CM: Unclear if plan continues to be inpatient Geriatric psych or if patient cleared for memory care unit. Will seek clarification and assist as needed with placement.  RN CM: LVMM for [name] (legal guardian-DSS) @ [phone] requesting callback to discuss placement issues.

## 2019-02-15 NOTE — ED Notes (Signed)
BEHAVIORAL HEALTH ROUNDING Patient sleeping: Yes.   Patient alert and oriented: eyes closed  Appears asleep Behavior appropriate: Yes.  ; If no, describe:  Nutrition and fluids offered: Yes  Toileting and hygiene offered: sleeping Sitter present: q 15 minute observations and security camera monitoring  

## 2019-02-16 NOTE — ED Notes (Signed)
Meal tray provided.

## 2019-02-16 NOTE — ED Notes (Signed)
Pt has taken a shower.  

## 2019-02-16 NOTE — ED Notes (Signed)
IVC/  PENDING  PLACEMENT 

## 2019-02-16 NOTE — TOC Progression Note (Addendum)
Transition of Care [facility]) - Progression Note    Patient Details  Name: Peter Parsons MRN: [id number] Date of Birth: 1961-01-10  [facility] CM/SW Contact  Anselm Pancoast, RN Phone Number: 02/16/2019, 12:43 PM  Clinical Narrative:    Call to [facility] as requested to discuss discharge plan. Patient unable to return to group home due to safety concerns. Seeking memory care unit. LVMM for Malinda, Legal Guardian for assistance with placement as well.   FL2 completed and sent to [facility] for review. Need memory care unit.         Expected Discharge Plan and Services                                                 Social Determinants of Health (SDOH) Interventions    Readmission Risk Interventions No flowsheet data found.

## 2019-02-16 NOTE — ED Notes (Signed)
Hourly rounding reveals patient in room. No complaints, stable, in no acute distress. Q15 minute rounds and monitoring via Security Cameras to continue. 

## 2019-02-16 NOTE — ED Notes (Signed)
Report to include Situation, Background, Assessment, and Recommendations received from Amy RN. Patient alert and oriented, warm and dry, in no acute distress. Patient reports SI but has no plans.  Patient reports AH (states a voice tells him he has to keep up with the time or the world will fall apart).  Patient denies HI, VH and pain. Patient made aware of Q15 minute rounds and security cameras for their safety. Patient instructed to come to me with needs or concerns.

## 2019-02-16 NOTE — Care Management (Signed)
RN CM: Tawni Carnes to [facility] and [facility] with request for memory care placement.

## 2019-02-16 NOTE — ED Notes (Signed)
Hourly rounding reveals patient sleeping in room. No complaints, stable, in no acute distress. Q15 minute rounds and monitoring via Security Cameras to continue. 

## 2019-02-16 NOTE — NC FL2 (Signed)
[place] LEVEL OF CARE SCREENING TOOL     IDENTIFICATION  Patient Name: Peter Parsons Birthdate: 25-Oct-1960 Sex: male Admission Date (Current Location): 02/11/2019  [place] and [place] Number:  Engineering geologist and Address:  [facility], [address] [place], [place] [address]      Provider Number: [id number]  Attending Physician Name and Address:  No att. providers found  Relative Name and Phone Number:       Current Level of Care: Hospital Recommended Level of Care: Memory Care Prior Approval Number:    Date Approved/Denied:   PASRR Number:    Discharge Plan: Other (Comment)(Memory Care)    Current Diagnoses: Patient Active Problem List   Diagnosis Date Noted  . Altered mental status   . Paranoid schizophrenia ([place])     Orientation RESPIRATION BLADDER Height & Weight     Self, Time, Situation, Place  Normal Continent Weight: 77.1 kg Height:  5\' 11"  (180.3 cm)  BEHAVIORAL SYMPTOMS/MOOD NEUROLOGICAL BOWEL NUTRITION STATUS  Wanderer   Continent Diet  AMBULATORY STATUS COMMUNICATION OF NEEDS Skin   Independent Verbally Normal                       Personal Care Assistance Level of Assistance              Functional Limitations Info             SPECIAL CARE FACTORS FREQUENCY                       Contractures      Additional Factors Info                  Current Medications (02/16/2019):  This is the current hospital active medication list Current Facility-Administered Medications  Medication Dose Route Frequency Provider Last Rate Last Admin  . atorvastatin (LIPITOR) tablet 10 mg  10 mg Oral Daily Carrie Mew, MD   10 mg at 02/16/19 [id number]  . clonazePAM (KLONOPIN) tablet 0.5 mg  0.5 mg Oral BID Cristofano, Dorene Ar, MD   0.5 mg at 02/16/19 0918  . gabapentin (NEURONTIN) capsule 300 mg  300 mg Oral TID Carrie Mew, MD   300 mg at 02/16/19 0919  . lisinopril (ZESTRIL) tablet  20 mg  20 mg Oral Daily Carrie Mew, MD   20 mg at 02/16/19 0919  . metFORMIN (GLUCOPHAGE) tablet 500 mg  500 mg Oral BID WC Carrie Mew, MD   500 mg at 02/16/19 0919  . risperiDONE (RISPERDAL) tablet 3 mg  3 mg Oral BID Carrie Mew, MD   3 mg at 02/16/19 [id number]  . tamsulosin (FLOMAX) capsule 0.4 mg  0.4 mg Oral Daily Carrie Mew, MD   0.4 mg at 02/16/19 [id number]  . traZODone (DESYREL) tablet 150 mg  150 mg Oral QHS Carrie Mew, MD   150 mg at 02/15/19 1952   Current Outpatient Medications  Medication Sig Dispense Refill  . atorvastatin (LIPITOR) 10 MG tablet Take 10 mg by mouth daily.    Marland Kitchen gabapentin (NEURONTIN) 300 MG capsule Take 300 mg by mouth 3 (three) times daily.    Marland Kitchen lisinopril (ZESTRIL) 20 MG tablet Take 1 tablet (20 mg total) by mouth daily. 30 tablet 0  . metFORMIN (GLUCOPHAGE) 500 MG tablet Take 1 tablet (500 mg total) by mouth 2 (two) times daily with a meal. 30 tablet 0  . paliperidone (INVEGA SUSTENNA) 234 MG/1.5ML  SUSY injection Inject 234 mg into the muscle every 28 (twenty-eight) days. 1.8 mL 0  . risperiDONE (RISPERDAL) 3 MG tablet Take 1 tablet (3 mg total) by mouth 2 (two) times daily. 45 tablet 0  . tamsulosin (FLOMAX) 0.4 MG CAPS capsule Take 0.4 mg by mouth daily.    . traZODone (DESYREL) 150 MG tablet Take 150 mg by mouth at bedtime.        Discharge Medications: Please see discharge summary for a list of discharge medications.  Relevant Imaging Results:  Relevant Lab Results:   Additional Information SS# [id number]  Anselm Pancoast, RN

## 2019-02-16 NOTE — ED Provider Notes (Signed)
-----------------------------------------   6:07 AM on 02/16/2019 -----------------------------------------   Blood pressure 130/82, pulse 99, temperature 98.5 F (36.9 C), temperature source Oral, resp. rate 18, height 5\' 11"  (1.803 m), weight 77.1 kg, SpO2 97 %.  The patient is sleeping at this time.  There have been no acute events since the last update.  Awaiting disposition plan from Behavioral Medicine and/or Social Work team(s).   Paulette Blanch, MD 02/16/19 [phone]

## 2019-02-17 NOTE — ED Provider Notes (Signed)
-----------------------------------------   5:44 AM on 02/17/2019 -----------------------------------------   Blood pressure (!) 134/91, pulse 92, temperature 98.7 F (37.1 C), temperature source Oral, resp. rate 20, height 5\' 11"  (1.803 m), weight 77.1 kg, SpO2 100 %.  The patient is sleeping at this time.  There have been no acute events since the last update.  Awaiting disposition plan from Behavioral Medicine and/or Social Work team(s).   Paulette Blanch, MD 02/17/19 [phone]

## 2019-02-17 NOTE — ED Notes (Signed)

## 2019-02-17 NOTE — ED Notes (Signed)
BEHAVIORAL HEALTH ROUNDING Patient sleeping: No. Patient alert: yes Behavior appropriate: Yes.  ; If no, describe:  Nutrition and fluids offered: yes Toileting and hygiene offered: Yes  Sitter present: q15 minute observations and security camera monitoring   

## 2019-02-17 NOTE — ED Notes (Signed)
Hourly rounding reveals patient sleeping in room. No complaints, stable, in no acute distress. Q15 minute rounds and monitoring via Security Cameras to continue. 

## 2019-02-17 NOTE — ED Notes (Signed)
Patient observed lying in bed with eyes closed  Even, unlabored respirations observed   NAD pt appears to be sleeping  I will continue to monitor along with every 15 minute visual observations and ongoing security camera monitoring    

## 2019-02-17 NOTE — ED Notes (Signed)
IVC/Pending Placement 

## 2019-02-17 NOTE — ED Notes (Signed)
Hourly rounding reveals patient in room. No complaints, stable, in no acute distress. Q15 minute rounds and monitoring via Security Cameras to continue. 

## 2019-02-17 NOTE — ED Notes (Signed)
ED BHU Is the patient under IVC or is there intent for IVC: Yes.   Is the patient medically cleared: Yes.   Is there vacancy in the ED BHU: Yes.   Is the population mix appropriate for patient: Yes.   Is the patient awaiting placement in inpatient or outpatient setting: Yes.  He is awaiting placement   Has the patient had a psychiatric consult: Yes.   Survey of unit performed for contraband, proper placement and condition of furniture, tampering with fixtures in bathroom, shower, and each patient room: Yes.  ; Findings:  APPEARANCE/BEHAVIOR Calm and cooperative NEURO ASSESSMENT Orientation: oriented to self and place  He know the year but not the date  Reoriented to time and situation   Denies pain Hallucinations: No.None noted (Hallucinations)  denies Speech: Normal Gait: normal RESPIRATORY ASSESSMENT Even  Unlabored respirations  CARDIOVASCULAR ASSESSMENT Pulses equal   regular rate  Skin warm and dry   GASTROINTESTINAL ASSESSMENT no GI complaint EXTREMITIES Full ROM  PLAN OF CARE Provide calm/safe environment. Vital signs assessed twice daily. ED BHU Assessment once each 12-hour shift. Assure the ED provider has rounded once each shift. Provide and encourage hygiene. Provide redirection as needed. Assess for escalating behavior; address immediately and inform ED provider.  Assess family dynamic and appropriateness for visitation as needed: Yes.  ; If necessary, describe findings:  Educate the patient/family about BHU procedures/visitation: Yes.  ; If necessary, describe findings:

## 2019-02-17 NOTE — ED Notes (Signed)
He has ambulated to and from the BR with a steady gait  

## 2019-02-17 NOTE — ED Notes (Signed)
Pt ambulated to bathroom.   Pt is now in bed

## 2019-02-17 NOTE — ED Notes (Addendum)
Hourly rounding reveals patient awake in room. No complaints, stable, in no acute distress. Q15 minute rounds and monitoring via Security Cameras to continue. 

## 2019-02-17 NOTE — ED Notes (Signed)
Report to include Situation, Background, Assessment, and Recommendations received from Amy RN. Patient alert and oriented, warm and dry, in no acute distress. Patient denies SI, HI, AVH and pain. Patient made aware of Q15 minute rounds and security cameras for their safety. Patient instructed to come to me with needs or concerns.

## 2019-02-18 NOTE — ED Notes (Signed)
Hourly rounding reveals patient in room. No complaints, stable, in no acute distress. Q15 minute rounds and monitoring via Security Cameras to continue. 

## 2019-02-18 NOTE — ED Notes (Signed)
IVC/  PENDING  PLACEMENT 

## 2019-02-18 NOTE — ED Notes (Addendum)
Hourly rounding reveals patient in room. No complaints, stable, in no acute distress. Q15 minute rounds and monitoring via Security Cameras to continue. 

## 2019-02-18 NOTE — Care Management (Signed)
RN CM: LVMM for guardian, [name], advising patient was stable for discharge to locked facility and no availability had been located by RN CM. Seeking assistance from guardian with placement.

## 2019-02-18 NOTE — ED Notes (Signed)
Gave pt [place] tray and drink. Informed nurse patient inquiring about nighttime BP medications.AS

## 2019-02-18 NOTE — ED Notes (Signed)
Patient taking a shower, no signs of distress.

## 2019-02-18 NOTE — ED Notes (Signed)
Covid test performed per Nurse, Patient tolerated well, Patient is cooperative, will continue to monitor.

## 2019-02-18 NOTE — ED Notes (Signed)
Patient ate 100% of lunch and beverage, no signs of distress, He does have flat affect, talks very low, poor eye contact. Nurse will continue to monitor, q 15 minute checks and camera surveillance in progress for safety.

## 2019-02-19 LAB — SARS CORONAVIRUS 2 (TAT 6-24 HRS): SARS Coronavirus 2: NEGATIVE

## 2019-02-19 NOTE — ED Provider Notes (Signed)
-----------------------------------------   1:54 AM on 02/19/2019 -----------------------------------------   Blood pressure 131/89, pulse 90, temperature 98 F (36.7 C), temperature source Oral, resp. rate 16, height 1.803 m (5\' 11" ), weight 77.1 kg, SpO2 100 %.  The patient is calm and cooperative at this time.  There have been no acute events since the last update.  Awaiting disposition plan from Behavioral Medicine and/or Social Work team(s).   Hinda Kehr, MD 02/19/19 [phone]

## 2019-02-19 NOTE — ED Notes (Signed)
BEHAVIORAL HEALTH ROUNDING Patient sleeping: No. Patient alert: yes Behavior appropriate: Yes.  ; If no, describe:  Nutrition and fluids offered: yes Toileting and hygiene offered: Yes  Sitter present: q15 minute observations and security camera monitoring   

## 2019-02-19 NOTE — ED Notes (Signed)
Pt. Walking in unit common area.   Patients only concern is when he would be getting his meds this evening.

## 2019-02-19 NOTE — ED Notes (Signed)
BEHAVIORAL HEALTH ROUNDING Patient sleeping: Yes.   Patient alert and oriented: eyes closed  Appears asleep Behavior appropriate: Yes.  ; If no, describe:  Nutrition and fluids offered: Yes  Toileting and hygiene offered: sleeping Sitter present: q 15 minute observations and security camera monitoring  

## 2019-02-19 NOTE — ED Notes (Signed)
Pt given breakfast meal tray. 

## 2019-02-19 NOTE — ED Notes (Signed)
ED BHU  Is the patient under IVC or is there intent for IVC: Yes.   Is the patient medically cleared: Yes.   Is there vacancy in the ED BHU: Yes.   Is the population mix appropriate for patient: Yes.   Is the patient awaiting placement in inpatient or outpatient setting: Yes.  He is awaiting placement Has the patient had a psychiatric consult: Yes.   Survey of unit performed for contraband, proper placement and condition of furniture, tampering with fixtures in bathroom, shower, and each patient room: Yes.  ; Findings:  APPEARANCE/BEHAVIOR Calm and cooperative NEURO ASSESSMENT Orientation: oriented to self and place  Reoriented to time and situation  He denies pain Hallucinations: yes  He verbalizes feeling out of his head and that voices are demanding him to do things and he is trying his best to not act on what is being said to him by these voices   He can hear the music from a TV ina nother room - he verbalizes that the music is taking over him  Speech: Normal Gait: normal RESPIRATORY ASSESSMENT Even  Unlabored respirations  CARDIOVASCULAR ASSESSMENT Pulses equal   regular rate  Skin warm and dry   GASTROINTESTINAL ASSESSMENT no GI complaint EXTREMITIES Full ROM  PLAN OF CARE Provide calm/safe environment. Vital signs assessed twice daily. ED BHU Assessment once each 12-hour shift.  Assure the ED provider has rounded once each shift. Provide and encourage hygiene. Provide redirection as needed. Assess for escalating behavior; address immediately and inform ED provider.  Assess family dynamic and appropriateness for visitation as needed: Yes.  ; If necessary, describe findings:  Educate the patient/family about BHU procedures/visitation: Yes.  ; If necessary, describe findings:

## 2019-02-19 NOTE — Care Management (Signed)
RN CM: Incoming call from Peter Parsons, legal guardian through [place]. States she has talked to [facility], [facility]) and all are willing to accept Fl2 from hospital but not her. Requested we fax. Confirmed [facility] number is [phone]. [name] states she will call back with fax number for other facilities.

## 2019-02-19 NOTE — ED Notes (Signed)
Pt given lunch meal tray. Pt also took and shower and provided oral care independently.

## 2019-02-19 NOTE — ED Notes (Signed)
IVC/Pending Placement 

## 2019-02-19 NOTE — ED Notes (Signed)
Patient observed lying in bed with eyes closed  Even, unlabored respirations observed   NAD pt appears to be sleeping  I will continue to monitor along with every 15 minute visual observations and ongoing security camera monitoring    

## 2019-02-19 NOTE — ED Notes (Signed)

## 2019-02-19 NOTE — ED Notes (Signed)
Pt up to staffing desk requesting more pants and underwear d/t pt accidentally urinating himself. Pt provided behavioral approved pants and underwear.

## 2019-02-20 NOTE — ED Notes (Signed)
Pt allowed to use the phone.

## 2019-02-20 NOTE — ED Notes (Signed)
Pt. Up using bathroom, returned to room with steady gait. 

## 2019-02-20 NOTE — ED Notes (Signed)
Pt. Allowed to make brief phone call.

## 2019-02-20 NOTE — ED Notes (Signed)
Pt. Appears to be in a good mood, pt. Walking around day room area.

## 2019-02-20 NOTE — ED Notes (Signed)
Pt given meal tray.

## 2019-02-20 NOTE — ED Notes (Signed)
Pt given lunch tray.

## 2019-02-21 NOTE — ED Notes (Signed)
Hourly rounding reveals patient sleeping in room. No complaints, stable, in no acute distress. Q15 minute rounds and monitoring via Security Cameras to continue. 

## 2019-02-21 NOTE — ED Notes (Signed)
Pt given meal tray.

## 2019-02-21 NOTE — ED Notes (Signed)
Hourly rounding reveals patient awake in room. No complaints, stable, in no acute distress. Q15 minute rounds and monitoring via Security Cameras to continue. 

## 2019-02-21 NOTE — ED Provider Notes (Signed)
-----------------------------------------   6:00 AM on 02/21/2019 -----------------------------------------   Blood pressure 134/82, pulse 90, temperature 98 F (36.7 C), resp. rate 16, height 5\' 11"  (1.803 m), weight 77.1 kg, SpO2 99 %.  The patient is calm and cooperative at this time.  There have been no acute events since the last update.  Awaiting disposition plan from Behavioral Medicine and/or Social Work team(s).   Paulette Blanch, MD 02/21/19 0600

## 2019-02-21 NOTE — ED Notes (Signed)
Report to include Situation, Background, Assessment, and Recommendations received from Amy B. RN. Patient alert, warm and dry, in no acute distress. Patient denies SI, HI, AVH and pain. Patient made aware of Q15 minute rounds and security cameras for their safety. Patient instructed to come to me with needs or concerns. 

## 2019-02-21 NOTE — ED Notes (Signed)
Pt. Up using bathroom, pt. Returned to room with steady gait. 

## 2019-02-21 NOTE — ED Notes (Signed)
Pt given breakfast tray

## 2019-02-22 NOTE — ED Notes (Signed)
Hourly rounding reveals patient awake in room. No complaints, stable, in no acute distress. Q15 minute rounds and monitoring via Security Cameras to continue. 

## 2019-02-22 NOTE — ED Notes (Signed)
Hourly rounding reveals patient sleeping in room. No complaints, stable, in no acute distress. Q15 minute rounds and monitoring via Security Cameras to continue. 

## 2019-02-22 NOTE — ED Notes (Signed)
Hourly rounding reveals patient asleep in room. No complaints, stable, in no acute distress. Q15 minute rounds and monitoring via Security Cameras to continue. 

## 2019-02-22 NOTE — ED Notes (Signed)
Report to include Situation, Background, Assessment, and Recommendations received from Bill Smith RN. Patient alert and oriented, warm and dry, in no acute distress. Patient denies SI, HI, AVH and pain. Patient made aware of Q15 minute rounds and security cameras for their safety. Patient instructed to come to me with needs or concerns. 

## 2019-02-22 NOTE — ED Notes (Signed)
Hourly rounding reveals patient in day room. No complaints, stable, in no acute distress. Q15 minute rounds and monitoring via Security Cam[employer] to continue. 

## 2019-02-22 NOTE — ED Provider Notes (Signed)
-----------------------------------------   9:04 AM on 02/22/2019 -----------------------------------------   Blood pressure 116/67, pulse (!) 103, temperature 98.7 F (37.1 C), temperature source Oral, resp. rate 16, height 5\' 11"  (1.803 m), weight 77.1 kg, SpO2 100 %.  The patient is calm and cooperative at this time.  There have been no acute events since the last update.  Awaiting disposition plan from Behavioral Medicine and/or Social Work team(s).   Carrie Mew, MD 02/22/19 [phone]

## 2019-02-22 NOTE — ED Notes (Signed)
Hourly rounding reveals patient sleeping in room. Pt awakened and given breakfast tray. No complaints, stable, in no acute distress. Q15 minute rounds and monitoring via Security Cameras to continue. 

## 2019-02-23 LAB — SARS CORONAVIRUS 2 (TAT 6-24 HRS): SARS Coronavirus 2: NEGATIVE

## 2019-02-23 NOTE — ED Notes (Signed)
ED BHU  Is the patient under IVC or is there intent for IVC: Yes.   Is the patient medically cleared: Yes.   Is there vacancy in the ED BHU: Yes.   Is the population mix appropriate for patient: Yes.   Is the patient awaiting placement in inpatient or outpatient setting: Yes.  Social work is finding him a safe place to live  Has the patient had a psychiatric consult: Yes.   He is cleared by psychiatry  Survey of unit performed for contraband, proper placement and condition of furniture, tampering with fixtures in bathroom, shower, and each patient room: Yes.  ; Findings:  APPEARANCE/BEHAVIOR Calm and cooperative NEURO ASSESSMENT Orientation: oriented to self and place  Reoriented him to time and situation   Denies pain Hallucinations: No.None noted (Hallucinations)  Denies  Speech: Normal Gait: normal RESPIRATORY ASSESSMENT Even  Unlabored respirations  CARDIOVASCULAR ASSESSMENT Pulses equal   regular rate  Skin warm and dry   GASTROINTESTINAL ASSESSMENT no GI complaint EXTREMITIES Full ROM  PLAN OF CARE Provide calm/safe environment. Vital signs assessed twice daily. ED BHU Assessment once each 12-hour shift.  Assure the ED provider has rounded once each shift. Provide and encourage hygiene. Provide redirection as needed. Assess for escalating behavior; address immediately and inform ED provider.  Assess family dynamic and appropriateness for visitation as needed: Yes.  ; If necessary, describe findings:  Educate the patient/family about BHU procedures/visitation: Yes.  ; If necessary, describe findings:

## 2019-02-23 NOTE — ED Notes (Signed)
BEHAVIORAL HEALTH ROUNDING Patient sleeping: Yes.   Patient alert and oriented: eyes closed  Appears asleep Behavior appropriate: Yes.  ; If no, describe:  Nutrition and fluids offered: Yes  Toileting and hygiene offered: sleeping Sitter present: q 15 minute observations and security camera monitoring  

## 2019-02-23 NOTE — ED Notes (Signed)
Hourly rounding reveals patient sleeping in room. No complaints, stable, in no acute distress. Q15 minute rounds and monitoring via Security Cameras to continue. 

## 2019-02-23 NOTE — ED Notes (Signed)
BEHAVIORAL HEALTH ROUNDING Patient sleeping: No. Patient alert: yes Behavior appropriate: Yes.  ; If no, describe:  Nutrition and fluids offered: yes Toileting and hygiene offered: Yes  Sitter present: q15 minute observations and security camera monitoring   

## 2019-02-23 NOTE — ED Notes (Signed)
Hourly rounding reveals patient in day room. No complaints, stable, in no acute distress. Q15 minute rounds and monitoring via Security Cam[employer] to continue. 

## 2019-02-23 NOTE — ED Notes (Signed)
He has ambulated to and from the BR with a steady gait  

## 2019-02-23 NOTE — ED Provider Notes (Signed)
-----------------------------------------   3:54 AM on 02/23/2019 -----------------------------------------   Blood pressure 110/66, pulse 100, temperature 98.7 F (37.1 C), temperature source Oral, resp. rate 16, height 5\' 11"  (1.803 m), weight 77.1 kg, SpO2 100 %.  The patient had no acute events since last update.  Calm and cooperative at this time.  Disposition is pending per Psychiatry/Behavioral Medicine team recommendations.     Alfred Levins, [place], MD 02/23/19 [phone]

## 2019-02-23 NOTE — ED Notes (Signed)
Hourly rounding reveals patient in hall. No complaints, stable, in no acute distress. Q15 minute rounds and monitoring via Security Cameras to continue. 

## 2019-02-23 NOTE — ED Notes (Signed)
Patient observed lying in bed with eyes closed  Even, unlabored respirations observed   NAD pt appears to be sleeping  I will continue to monitor along with every 15 minute visual observations and ongoing security camera monitoring    

## 2019-02-23 NOTE — ED Notes (Signed)
BEHAVIORAL HEALTH ROUNDING Patient sleeping: No. Patient alert : yes Behavior appropriate: Yes.  ; If no, describe:  Nutrition and fluids offered: yes Toileting and hygiene offered: Yes  Sitter present: q15 minute observations and security camera monitoring Law enforcement present: Yes  ODS  ENVIRONMENTAL ASSESSMENT Potentially harmful objects out of patient reach: Yes.   Personal belongings secured: Yes.   Patient dressed in hospital provided attire only: Yes.   Plastic bags out of patient reach: Yes.   Patient care equipment (cords, cables, call bells, lines, and drains) shortened, removed, or accounted for: Yes.   Equipment and supplies removed from bottom of stretcher: Yes.   Potentially toxic materials out of patient reach: Yes.   Sharps container removed or out of patient reach: Yes.

## 2019-02-23 NOTE — ED Notes (Signed)
Report to include Situation, Background, Assessment, and Recommendations received from Amy Teague RN. Patient alert, warm and dry, in no acute distress. Patient denies SI, HI, AVH and pain. Patient made aware of Q15 minute rounds and security cameras for their safety. Patient instructed to come to me with needs or concerns. 

## 2019-02-24 NOTE — ED Notes (Signed)
Hourly rounding reveals patient sleeping in room. No complaints, stable, in no acute distress. Q15 minute rounds and monitoring via Security Cameras to continue. 

## 2019-02-24 NOTE — ED Notes (Signed)
Hourly rounding reveals patient in room. No complaints, stable, in no acute distress. Q15 minute rounds and monitoring via Security Cameras to continue. 

## 2019-02-24 NOTE — ED Notes (Signed)
Hourly rounding reveals patient in rest room. No complaints, stable, in no acute distress. Q15 minute rounds and monitoring via Security Cameras to continue. 

## 2019-02-24 NOTE — ED Notes (Signed)
IVC papers expire 1/14 0600 pending placement

## 2019-02-24 NOTE — ED Provider Notes (Signed)
-----------------------------------------   8:48 AM on 02/24/2019 -----------------------------------------   BP 122/78 (BP Location: Left Arm)   Pulse (!) 112   Temp 98.4 F (36.9 C) (Oral)   Resp 18   Ht 5\' 11"  (1.803 m)   Wt 77.1 kg   SpO2 99%   BMI 23.71 kg/m   The patient is calm and cooperative at this time.  There have been no acute events since the last update.  Awaiting disposition plan from Behavioral Medicine and/or Social Work team(s).  Awaiting placement.    Lilia Pro., MD 02/24/19 [phone]

## 2019-02-24 NOTE — ED Notes (Signed)
Report to include Situation, Background, Assessment, and Recommendations received from [facility]. Patient alert and oriented, warm and dry, in no acute distress. Patient denies SI, HI, AVH and pain. Patient made aware of Q15 minute rounds and security cameras for their safety. Patient instructed to come to me with needs or concerns.

## 2019-02-25 LAB — CBC WITH DIFFERENTIAL/PLATELET
Abs Immature Granulocytes: 0.03 10*3/uL (ref 0.00–0.07)
Basophils Absolute: 0 10*3/uL (ref 0.0–0.1)
Basophils Relative: 1 %
Eosinophils Absolute: 0.1 10*3/uL (ref 0.0–0.5)
Eosinophils Relative: 2 %
HCT: 31.2 % — ABNORMAL LOW (ref 39.0–52.0)
Hemoglobin: 10.7 g/dL — ABNORMAL LOW (ref 13.0–17.0)
Immature Granulocytes: 0 %
Lymphocytes Relative: 20 %
Lymphs Abs: 1.4 10*3/uL (ref 0.7–4.0)
MCH: 29.6 pg (ref 26.0–34.0)
MCHC: 34.3 g/dL (ref 30.0–36.0)
MCV: 86.4 fL (ref 80.0–100.0)
Monocytes Absolute: 0.3 10*3/uL (ref 0.1–1.0)
Monocytes Relative: 5 %
Neutro Abs: 5.2 10*3/uL (ref 1.7–7.7)
Neutrophils Relative %: 72 %
Platelets: 136 10*3/uL — ABNORMAL LOW (ref 150–400)
RBC: 3.61 MIL/uL — ABNORMAL LOW (ref 4.22–5.81)
RDW: 14.8 % (ref 11.5–15.5)
WBC: 7.2 10*3/uL (ref 4.0–10.5)
nRBC: 0 % (ref 0.0–0.2)

## 2019-02-25 LAB — BASIC METABOLIC PANEL
Anion gap: 7 (ref 5–15)
BUN: 25 mg/dL — ABNORMAL HIGH (ref 6–20)
CO2: 30 mmol/L (ref 22–32)
Calcium: 9.3 mg/dL (ref 8.9–10.3)
Chloride: 105 mmol/L (ref 98–111)
Creatinine, Ser: 1.43 mg/dL — ABNORMAL HIGH (ref 0.61–1.24)
GFR calc Af Amer: >60 mL/min (ref >60)
GFR calc non Af Amer: 53 mL/min — ABNORMAL LOW (ref >60)
Glucose, Bld: 106 mg/dL — ABNORMAL HIGH (ref 70–99)
Potassium: 4.2 mmol/L (ref 3.5–5.1)
Sodium: 142 mmol/L (ref 135–145)

## 2019-02-25 NOTE — ED Notes (Signed)
Hourly rounding reveals patient in room. No complaints, stable, in no acute distress. Q15 minute rounds and monitoring via Security Cameras to continue. 

## 2019-02-25 NOTE — ED Notes (Signed)
Dr. Cherylann Banas consulted regarding pt's HR 120 when previously documented HR's have been 90-100. Primary RN aware that pt's HR elevated. See orders. Will collect labs and evaluate after labs result.

## 2019-02-25 NOTE — ED Notes (Signed)
Pt  Placed  Under  3rd  Set of  IVC  PAPERS  PENDING  PLACEMENT

## 2019-02-25 NOTE — ED Notes (Signed)
Pt speaking with an Therapist, sports from [employer].  Maintained on 15 minute checks and observation by security camera for safety.

## 2019-02-25 NOTE — ED Provider Notes (Signed)
-----------------------------------------   7:08 AM on 02/25/2019 -----------------------------------------   Blood pressure (!) 148/77, pulse (!) 111, temperature 98.4 F (36.9 C), temperature source Oral, resp. rate (!) 22, height 5\' 11"  (1.803 m), weight 77.1 kg, SpO2 100 %.  The patient is calm and cooperative at this time.  There have been no acute events since the last update.  Awaiting disposition plan from Behavioral Medicine and/or Social Work team(s).   Arta Silence, MD 02/25/19 [phone]

## 2019-02-26 LAB — SARS CORONAVIRUS 2 (TAT 6-24 HRS): SARS Coronavirus 2: NEGATIVE

## 2019-02-26 NOTE — ED Notes (Signed)
Patient refused breakfast this am, states " I'm not hungry" nurse ask him if He would take a shower and He said " maybe later" Patient is sleepy, appears lethargic, all movements are slow, nurse will continue to monitor, q 15 minute checks and camera surveillance in progress for safety.

## 2019-02-26 NOTE — ED Notes (Signed)
Patient is eating lunch and beverage, no signs of distress.

## 2019-02-26 NOTE — NC FL2 (Signed)
[place] LEVEL OF CARE SCREENING TOOL     IDENTIFICATION  Patient Name: Peter Parsons Birthdate: [birth year] Sex: male Admission Date (Current Location): 02/11/2019  [place] and [place] Number:  Engineering geologist and Address:  [facility], [address], [place], [place] [address]      Provider Number: [id number]  Attending Physician Name and Address:  No att. providers found  Relative Name and Phone Number:       Current Level of Care: Hospital Recommended Level of Care: [place] Prior Approval Number:    Date Approved/Denied:   PASRR Number:    Discharge Plan: Other (Comment)(Memory Care)    Current Diagnoses: Patient Active Problem List   Diagnosis Date Noted  . Altered mental status   . Paranoid schizophrenia ([place])     Orientation RESPIRATION BLADDER Height & Weight     Self, Time, Situation, Place  Normal Continent Weight: 77.1 kg Height:  5\' 11"  (180.3 cm)  BEHAVIORAL SYMPTOMS/MOOD NEUROLOGICAL BOWEL NUTRITION STATUS  [place]   Continent Diet  AMBULATORY STATUS COMMUNICATION OF NEEDS Skin   Independent Verbally Normal                       Personal Care Assistance Level of Assistance              Functional Limitations Info             SPECIAL CARE FACTORS FREQUENCY                       Contractures      Additional Factors Info                  Current Medications (02/26/2019):  This is the current hospital active medication list Current Facility-Administered Medications  Medication Dose Route Frequency Provider Last Rate Last Admin  . atorvastatin (LIPITOR) tablet 10 mg  10 mg Oral Daily Carrie Mew, MD   10 mg at 02/26/19 [id number]  . clonazePAM (KLONOPIN) tablet 0.5 mg  0.5 mg Oral BID Cristofano, Dorene Ar, MD   0.5 mg at 02/26/19 0928  . gabapentin (NEURONTIN) capsule 300 mg  300 mg Oral TID Carrie Mew, MD   300 mg at 02/26/19 0929  . lisinopril  (ZESTRIL) tablet 20 mg  20 mg Oral Daily Carrie Mew, MD   20 mg at 02/25/19 0926  . metFORMIN (GLUCOPHAGE) tablet 500 mg  500 mg Oral BID WC Carrie Mew, MD   500 mg at 02/26/19 0926  . risperiDONE (RISPERDAL) tablet 3 mg  3 mg Oral BID Carrie Mew, MD   3 mg at 02/26/19 [id number]  . tamsulosin (FLOMAX) capsule 0.4 mg  0.4 mg Oral Daily Carrie Mew, MD   0.4 mg at 02/26/19 [id number]  . traZODone (DESYREL) tablet 150 mg  150 mg Oral QHS Carrie Mew, MD   150 mg at 02/25/19 2227   Current Outpatient Medications  Medication Sig Dispense Refill  . atorvastatin (LIPITOR) 10 MG tablet Take 10 mg by mouth daily.    Marland Kitchen gabapentin (NEURONTIN) 300 MG capsule Take 300 mg by mouth 3 (three) times daily.    Marland Kitchen lisinopril (ZESTRIL) 20 MG tablet Take 1 tablet (20 mg total) by mouth daily. 30 tablet 0  . metFORMIN (GLUCOPHAGE) 500 MG tablet Take 1 tablet (500 mg total) by mouth 2 (two) times daily with a meal. 30 tablet 0  . paliperidone (INVEGA SUSTENNA) 234  MG/1.5ML SUSY injection Inject 234 mg into the muscle every 28 (twenty-eight) days. 1.8 mL 0  . risperiDONE (RISPERDAL) 3 MG tablet Take 1 tablet (3 mg total) by mouth 2 (two) times daily. 45 tablet 0  . tamsulosin (FLOMAX) 0.4 MG CAPS capsule Take 0.4 mg by mouth daily.    . traZODone (DESYREL) 150 MG tablet Take 150 mg by mouth at bedtime.        Discharge Medications: Please see discharge summary for a list of discharge medications.  Relevant Imaging Results:  Relevant Lab Results:   Additional Information SS# [id number]  Anselm Pancoast, RN

## 2019-02-26 NOTE — ED Notes (Signed)
Patient came up to nursing station and ask for a drink, He also went to the bathroom, patient without any complaints.

## 2019-02-26 NOTE — ED Notes (Signed)
Patient was sitting on side of bed, states " I had a dream and now I understand about the sexiest, and I don't know what to do about it" nurse reassured him everything was ok and that if He needed to talk nurse would listen, He states " I think I will go back to sleep" , will continue to monitor.

## 2019-02-26 NOTE — ED Notes (Signed)
Pt. In dayroom area talking to other patients.  Pt. Requested and was given a soda, peanut butter, and crackers.

## 2019-02-26 NOTE — ED Provider Notes (Signed)
-----------------------------------------   6:00 AM on 02/26/2019 -----------------------------------------   Blood pressure 99/67, pulse 99, temperature 98.9 F (37.2 C), temperature source Oral, resp. rate 18, height 5\' 11"  (1.803 m), weight 77.1 kg, SpO2 97 %.  The patient is sleeping at this time.  There have been no acute events since the last update.  Awaiting disposition plan from Behavioral Medicine and/or Social Work team(s).   Paulette Blanch, MD 02/26/19 [phone]

## 2019-02-26 NOTE — ED Notes (Signed)
Patient informed that it was 'lights out' and it was time to return to room. Patient verbalized understanding. Patient returned to room with no issue.

## 2019-02-26 NOTE — ED Notes (Signed)
Dinner tray provided

## 2019-02-26 NOTE — TOC Progression Note (Signed)
Transition of Care [facility]) - Progression Note    Patient Details  Name: Peter Parsons MRN: [id number] Date of Birth: [birth year]  [facility]) CM/SW Contact  Anselm Pancoast, RN Phone Number: 02/26/2019, 12:28 PM  Clinical Narrative:     Tawni Carnes and MD notes to [facility] as requested for review. Possible admission early next week.        Expected Discharge Plan and Services                                                 Social Determinants of Health (SDOH) Interventions    Readmission Risk Interventions No flowsheet data found.

## 2019-02-26 NOTE — ED Notes (Signed)
Patient is taking a shower, no signs of distress.

## 2019-02-27 NOTE — ED Notes (Signed)
BEHAVIORAL HEALTH ROUNDING Patient sleeping: No. Patient alert: yes Behavior appropriate: Yes.  ; If no, describe:  Nutrition and fluids offered: yes Toileting and hygiene offered: Yes  Sitter present: q15 minute observations and security camera monitoring   

## 2019-02-27 NOTE — ED Provider Notes (Signed)
-----------------------------------------   6:33 AM on 02/27/2019 -----------------------------------------   Blood pressure 125/76, pulse 88, temperature 98.5 F (36.9 C), temperature source Oral, resp. rate 16, height 5\' 11"  (1.803 m), weight 77.1 kg, SpO2 96 %.  The patient is sleeping at this time.  There have been no acute events since the last update.  Awaiting disposition plan from Behavioral Medicine and/or Social Work team(s).   Paulette Blanch, MD 02/27/19 [phone]

## 2019-02-27 NOTE — ED Notes (Signed)
Pt. Requested and was given cup of OJ.

## 2019-02-27 NOTE — ED Notes (Signed)
BEHAVIORAL HEALTH ROUNDING Patient sleeping: No. Patient alert : yes Behavior appropriate: Yes.  ; If no, describe:  Nutrition and fluids offered: yes Toileting and hygiene offered: Yes  Sitter present: q15 minute observations and security camera monitoring    ENVIRONMENTAL ASSESSMENT Potentially harmful objects out of patient reach: Yes.   Personal belongings secured: Yes.   Patient dressed in hospital provided attire only: Yes.   Plastic bags out of patient reach: Yes.   Patient care equipment (cords, cables, call bells, lines, and drains) shortened, removed, or accounted for: Yes.   Equipment and supplies removed from bottom of stretcher: Yes.   Potentially toxic materials out of patient reach: Yes.   Sharps container removed or out of patient reach: Yes.   

## 2019-02-27 NOTE — ED Notes (Signed)
ED BHU  Is the patient under IVC or is there intent for IVC: Yes.   Is the patient medically cleared: Yes.   Is there vacancy in the ED BHU: Yes.   Is the population mix appropriate for patient: Yes.   Is the patient awaiting placement in inpatient or outpatient setting: Yes. Social work consult in progress Has the patient had a psychiatric consult: Yes.  He has been cleared by psychiatry   Survey of unit performed for contraband, proper placement and condition of furniture, tampering with fixtures in bathroom, shower, and each patient room: Yes.  ; Findings:  APPEARANCE/BEHAVIOR Calm and cooperative NEURO ASSESSMENT Orientation: oriented to self and place  Reoriented to time and situation  Denies pain Hallucinations: No.None noted (Hallucinations)  Denies  Speech: Normal Gait: normal RESPIRATORY ASSESSMENT Even  Unlabored respirations  CARDIOVASCULAR ASSESSMENT Pulses equal   regular rate  Skin warm and dry   GASTROINTESTINAL ASSESSMENT no GI complaint EXTREMITIES Full ROM  PLAN OF CARE Provide calm/safe environment. Vital signs assessed twice daily. ED BHU Assessment once each 12-hour shift. Assure the ED provider has rounded once each shift. Provide and encourage hygiene. Provide redirection as needed. Assess for escalating behavior; address immediately and inform ED provider.  Assess family dynamic and appropriateness for visitation as needed: Yes.  ; If necessary, describe findings:  Educate the patient/family about BHU procedures/visitation: Yes.  ; If necessary, describe findings:

## 2019-02-27 NOTE — ED Notes (Signed)
Patient observed lying in bed with eyes closed  Even, unlabored respirations observed   NAD pt appears to be sleeping  I will continue to monitor along with every 15 minute visual observations and ongoing security camera monitoring    

## 2019-02-28 NOTE — ED Provider Notes (Signed)
-----------------------------------------   5:25 AM on 02/28/2019 -----------------------------------------   Blood pressure (!) 183/60, pulse 93, temperature 98.6 F (37 C), temperature source Oral, resp. rate 18, height 5\' 11"  (1.803 m), weight 77.1 kg, SpO2 98 %.  There have been no acute events since the last update.  Awaiting disposition plan from Behavioral Medicine and/or Social Work team(s).    Harvest Dark, MD 02/28/19 [phone]

## 2019-02-28 NOTE — ED Notes (Signed)
Hourly rounding reveals patient in day room. No complaints, stable, in no acute distress. Q15 minute rounds and monitoring via Security Cam[employer] to continue. 

## 2019-02-28 NOTE — ED Notes (Signed)
Patient resting quietly in room. No noted distress or abnormal behaviors noted. Will continue 15 minute checks and observation by security camera for safety. 

## 2019-02-28 NOTE — ED Notes (Signed)
Pt up to the bathroom and then returned to bed.

## 2019-02-28 NOTE — ED Notes (Signed)
This NT gave pt breakfast tray at 9:20am.

## 2019-02-28 NOTE — ED Notes (Signed)
IVC working on possible transfer to [facility] early next week

## 2019-02-28 NOTE — ED Notes (Signed)
Hourly rounding reveals patient sleeping in room. No complaints, stable, in no acute distress. Q15 minute rounds and monitoring via Security Cameras to continue. 

## 2019-02-28 NOTE — ED Notes (Signed)
Pt needed re-direction after going to another patients doorway to ask for food.  Maintained on 15 minute checks and observation by security camera for safety.

## 2019-02-28 NOTE — ED Notes (Signed)
Report to include Situation, Background, Assessment, and Recommendations received from RN. Patient alert and oriented, warm and dry, in no acute distress. Patient denies SI, HI, AVH and pain. Patient made aware of Q15 minute rounds and security cameras for their safety. Patient instructed to come to me with needs or concerns.  

## 2019-02-28 NOTE — ED Notes (Signed)
Pt at window and slowly wandering through day room

## 2019-02-28 NOTE — ED Notes (Signed)
Hourly rounding reveals patient in room. No complaints, stable, in no acute distress. Q15 minute rounds and monitoring via Security Cameras to continue. 

## 2019-03-01 MED ORDER — CLONAZEPAM 0.5 MG PO TABS
0.2500 mg | ORAL_TABLET | Freq: Three times a day (TID) | ORAL | Status: DC | PRN
  Administered 2019-03-02 – 2019-07-12 (×38): 0.25 mg via ORAL
  Filled 2019-03-01 (×38): qty 1
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
## 2019-03-01 NOTE — ED Notes (Signed)
Pt ambulated to bathroom and back

## 2019-03-01 NOTE — ED Notes (Signed)
Hourly rounding reveals patient in room. No complaints, stable, in no acute distress. Q15 minute rounds and monitoring via Security Cameras to continue. 

## 2019-03-01 NOTE — ED Notes (Signed)
Hourly rounding reveals patient asleep in room. No complaints, stable, in no acute distress. Q15 minute rounds and monitoring via Security Cameras to continue. 

## 2019-03-01 NOTE — ED Notes (Signed)
Hourly rounding reveals patient sleeping in room. No complaints, stable, in no acute distress. Q15 minute rounds and monitoring via Security Cameras to continue. 

## 2019-03-01 NOTE — ED Notes (Signed)
Report to include Situation, Background, Assessment, and Recommendations received from Wendy RN. Patient alert, warm and dry, in no acute distress. Patient denies SI, HI, AVH and pain. Patient made aware of Q15 minute rounds and security cameras for their safety. Patient instructed to come to me with needs or concerns. 

## 2019-03-01 NOTE — ED Notes (Signed)
Patient up to the bathroom, ambulates very slowly, will continue to monitor, Patient with lunch tray and beverage, Patient is cooperative and calm, will continue to monitor.

## 2019-03-01 NOTE — Care Management (Signed)
TOC CM: LVMM for Sheila @ [facility] following up on bed request.  

## 2019-03-01 NOTE — ED Notes (Signed)
IVC/Pending placement to [facility] this week

## 2019-03-01 NOTE — ED Notes (Signed)
Hourly rounding reveals patient awake in room. No complaints, stable, in no acute distress. Q15 minute rounds and monitoring via Security Cameras to continue. 

## 2019-03-01 NOTE — ED Notes (Signed)
Pt asleep, breakfast tray and a sprite placed in rm.

## 2019-03-02 NOTE — ED Provider Notes (Signed)
-----------------------------------------   3:07 AM on 03/02/2019 -----------------------------------------   Blood pressure 114/90, pulse (!) 16, temperature 98.5 F (36.9 C), temperature source Oral, resp. rate 18, height 1.803 m (5\' 11" ), weight 77.1 kg, SpO2 99 %.  The patient is calm and cooperative at this time.  There have been no acute events since the last update.  Awaiting disposition plan from Behavioral Medicine and/or Social Work team(s).   Hinda Kehr, MD 03/02/19 [phone]

## 2019-03-02 NOTE — ED Notes (Signed)
Hourly rounding reveals patient sleeping in room. No complaints, stable, in no acute distress. Q15 minute rounds and monitoring via Security Cameras to continue. 

## 2019-03-02 NOTE — ED Notes (Signed)
Hourly rounding reveals patient in day room. No complaints, stable, in no acute distress. Q15 minute rounds and monitoring via Security Cam[employer] to continue. 

## 2019-03-02 NOTE — Social Work (Addendum)
TOCSW: Vonzella Nipple at [facility] for bed availability update (fl2 sent on 02/26/2019).  Left voicemail for Freda Munro at [phone] and [phone].  Left voicemail for [name] (guardian) to update on bed availability and to find out if she was able to find any availability on her end.

## 2019-03-02 NOTE — ED Notes (Signed)
Report to include Situation, Background, Assessment, and Recommendations received from Amy B. RN. Patient alert, warm and dry, in no acute distress. Patient denies SI, HI, AVH and pain. Patient made aware of Q15 minute rounds and security cameras for their safety. Patient instructed to come to me with needs or concerns. 

## 2019-03-02 NOTE — ED Notes (Signed)
IVC/ Still pending placement at [facility] waiting on callback

## 2019-03-02 NOTE — ED Notes (Signed)
This note is not being shared with the patient for the following reason: To prevent harm (release of this note would result in harm to the life or physical safety of the patient or another).  Pt to nurse's station door. "It's my birthday with the holiday with the metal thing in it and if he recites it I will be in pain."    Pt reassured he was safe in the hospital.  Pt thanked this Probation officer for being nice and returned to his room.

## 2019-03-02 NOTE — ED Notes (Signed)
Hourly rounding reveals patient in rest room. No complaints, stable, in no acute distress. Q15 minute rounds and monitoring via Security Cameras to continue. 

## 2019-03-02 NOTE — ED Notes (Signed)
Pt given sandwich tray, drink and ice cream 

## 2019-03-03 NOTE — ED Notes (Signed)
Hourly rounding reveals patient in room. No complaints, stable, in no acute distress. Q15 minute rounds and monitoring via Security Cameras to continue. 

## 2019-03-03 NOTE — ED Notes (Signed)
Patient up early to bathroom. Patient soft spoken requesting to shower, states has not showered in a few days. Clean cloth and shower supplies provided. Will monitor, safety maintained.

## 2019-03-03 NOTE — Social Work (Signed)
TOC SW: Received call from [employer] [facility], requesting update on patient's status.  Informed Ms. Quentin Cornwall about wait on bed availability, from [facility].   Update: Received email from Levan Hurst at [facility], bed unavailable for patient, "patient not good fit for the community".

## 2019-03-03 NOTE — ED Notes (Signed)
Hourly rounding reveals patient sleeping in room. No complaints, stable, in no acute distress. Q15 minute rounds and monitoring via Security Cameras to continue. 

## 2019-03-03 NOTE — ED Notes (Signed)
Patient up early and walking in and out of day room. Patient very slow moving and speaking slow and soft. Patient reports feeling strange, when asked to explain what strange means reported he does not know how to describe it, patient states feels afraid. Reports did not sleep last night due to all the violence. VSS will continue to monitor. Safety maintained.

## 2019-03-03 NOTE — ED Provider Notes (Signed)
-----------------------------------------   5:38 AM on 03/03/2019 -----------------------------------------   Blood pressure 109/77, pulse 88, temperature 98.6 F (37 C), temperature source Oral, resp. rate 18, height 5\' 11"  (1.803 m), weight 77.1 kg, SpO2 100 %.  The patient is calm and cooperative at this time.  There have been no acute events since the last update.  Awaiting disposition plan from Behavioral Medicine and/or Social Work team(s).   Paulette Blanch, MD 03/03/19 [phone]

## 2019-03-03 NOTE — Social Work (Addendum)
TOC SW: Sent FL2 to new [facility] fax number [phone]) [phone], Attn: Freda Munro, waiting on reply for placement.

## 2019-03-03 NOTE — ED Notes (Addendum)
Report to include Situation, Background, Assessment, and Recommendations received from Jeannette RN. Patient alert and oriented, warm and dry, in no acute distress. Patient denies SI, HI, AVH and pain. Patient made aware of Q15 minute rounds and [place] and Officer presence for their safety. Patient instructed to come to me with needs or concerns.   

## 2019-03-03 NOTE — ED Notes (Signed)
Hourly rounding reveals patient awake in room. No complaints, stable, in no acute distress. Q15 minute rounds and monitoring via Security Cameras to continue. 

## 2019-03-03 NOTE — Social Work (Signed)
TOCSW: Called and left voicemail for guardian in reference to patient living with his mother.

## 2019-03-03 NOTE — Social Work (Addendum)
TOC SW:  Spoke with patient's guardian, patient is not allowed to live with mother/father under any circumstances due to safety issues.  FL2 faxed to [facility] for availability. Left message for Levan Hurst for status.

## 2019-03-04 NOTE — ED Notes (Signed)
Hourly rounding reveals patient in room. No complaints, stable, in no acute distress. Q15 minute rounds and monitoring via Security Cameras to continue. 

## 2019-03-04 NOTE — ED Provider Notes (Signed)
-----------------------------------------   7:12 AM on 03/04/2019 -----------------------------------------   Blood pressure 121/81, pulse 99, temperature 98.4 F (36.9 C), temperature source Oral, resp. rate 18, height 5\' 11"  (1.803 m), weight 77.1 kg, SpO2 99 %.  The patient is calm and cooperative at this time.  There have been no acute events since the last update.  Awaiting disposition plan from Behavioral Medicine and/or Social Work team(s).    Merlyn Lot, MD 03/04/19 [phone]

## 2019-03-04 NOTE — ED Notes (Signed)
IVC/ Still pending placement   

## 2019-03-04 NOTE — ED Notes (Signed)
Pt. Stated he talked to his mother today, which made him feel good.  Pt. In lobby of unit talking with other patients and staff.

## 2019-03-04 NOTE — ED Notes (Signed)
Patient denies HV/SI/HI, reports feels like taking a shower. Patient allowed to take shower and change clothing. House keeping to called to mop his room. Bed linen changed. Awaiting lunch. Safety maintained.

## 2019-03-04 NOTE — ED Notes (Deleted)
Hourly rounding reveals patient in room. No complaints, stable, in no acute distress. Q15 minute rounds and monitoring via Security Cameras to continue. 

## 2019-03-04 NOTE — Care Management (Signed)
TOC RN CM: Call to Trey Sailors requesting locked unit via [place]. Audelia Acton states he will send out information to [facility] facility . Will follow for details and update family.

## 2019-03-05 NOTE — ED Notes (Signed)
Patient sleeping comfortably. Will continue to monitor. Lunch pending.

## 2019-03-05 NOTE — ED Notes (Signed)
Pt. Up using bathroom, pt. To nursing station requesting something to drink, pt. Given cup of OJ, pt. Returned to room.

## 2019-03-05 NOTE — Social Work (Addendum)
TOC SW:  Spoke with Geanie [name] [phone], [facility], they do not take Medicaid, and are unable to accept patient.

## 2019-03-05 NOTE — ED Notes (Signed)
Dietary called hot dinner order placed.

## 2019-03-05 NOTE — ED Notes (Signed)
Patient siting in day room socializing and talking with other patients

## 2019-03-05 NOTE — ED Notes (Signed)
IVC/  PENDING  PLACEMENT 

## 2019-03-05 NOTE — ED Notes (Signed)
Pt. In lobby area watching tv.  Pt. States he spoke to his mother twice today.  Pt. Stated "My mother is 55 and my father is 73.  Pt. Appears to be in a more cheerful mood than yesterday.  Pt. Requested and was given a cup of ginger ale.

## 2019-03-05 NOTE — Social Work (Signed)
TOC SW:  Spoke with Cecille Rubin (admin.) at [facility] [phone] in [place], they are currently not accepting new patients, but will revisit in one week. This SW will call back in one week.

## 2019-03-05 NOTE — Care Management (Addendum)
TOC RN CM: Received notice that [facility] was unable to accept this patient due to mental health needs.

## 2019-03-05 NOTE — ED Notes (Signed)
Patient up early reports having bad dreams. Patient with difficulty processing thoughts, slow speaking and slow moving. Denies pain or any other concerns. Vss. Safety maintained. Will continue to monitor.

## 2019-03-06 NOTE — ED Notes (Signed)
Pt. States "I feel nervous, I cant remember my home anymore".  Pt. Told he was still at hospital and we are looking for a safe home for him, but we will take care of you at hospital until suitable home is found.  Pt. Given prn medication to help with anxiety.

## 2019-03-06 NOTE — ED Notes (Signed)
Pt. Up using bathroom, pt. Returned to room # 4 with steady gait.

## 2019-03-06 NOTE — ED Provider Notes (Signed)
-----------------------------------------   7:42 AM on 03/06/2019 -----------------------------------------   Blood pressure 107/62, pulse (!) 102, temperature 98.5 F (36.9 C), temperature source Oral, resp. rate 18, height 1.803 m (5\' 11" ), weight 77.1 kg, SpO2 98 %.  The patient is calm and cooperative at this time.  There have been no acute events since the last update.  Awaiting disposition plan from Behavioral Medicine and/or Social Work team(s).   Hinda Kehr, MD 03/06/19 [phone]

## 2019-03-06 NOTE — ED Notes (Signed)
Pt given meal tray.

## 2019-03-06 NOTE — ED Notes (Signed)
Pt using phone in dayroom. Maintained on 15 minute checks and observation by security camera for safety.

## 2019-03-07 NOTE — ED Notes (Signed)
Pt given meal tray.

## 2019-03-07 NOTE — ED Notes (Signed)
Pt given phone to call mother

## 2019-03-07 NOTE — ED Notes (Signed)
Pt makes comments to this RN such as "I had a fretful night." When asked why, pt states "well I shouldn't wish to be here but with all the inner workings about me I can still be here. Otherwise God would not have plans for me." Pt calm and cooperative and comes in and out of sense.

## 2019-03-07 NOTE — ED Notes (Signed)
Report to include situation, background, assessment and recommendations from [employer]. Patient sleeping, respirations regular and unlabored. Q15 minute rounds and security camera observation to continue.

## 2019-03-07 NOTE — ED Provider Notes (Signed)
-----------------------------------------   7:32 AM on 03/07/2019 -----------------------------------------   BP 118/77 (BP Location: Left Arm)   Pulse 86   Temp 98.6 F (37 C) (Oral)   Resp 18   Ht 1.803 m (5\' 11" )   Wt 77.1 kg   SpO2 99%   BMI 23.71 kg/m   No acute events overnight. Vitals reviewed. Patient remains medically cleared.  Disposition is pending per Psychiatry/Behavioral Medicine team recommendations.    Lavonia Drafts, MD 03/07/19 [phone]

## 2019-03-07 NOTE — ED Notes (Signed)
Pt becomes fixated on [place], [place]. Pt has come up to [place] x3 stating random thoughts of harm such as "If you're going to kill me I'm going to kill your first" and "I know you're trying to kill me". Thayer Headings, [place] and this RN enforces that Thayer Headings is here to help. Pt does not act violently or make any threats but continues to make odd delusional statements like above.

## 2019-03-07 NOTE — ED Notes (Signed)
IVC/Pending Placement 

## 2019-03-07 NOTE — ED Notes (Signed)
Pt awake and up to the nurses station asking for his klonopin to help him rest. Medication given as ordered.

## 2019-03-07 NOTE — ED Notes (Signed)
Hourly rounding reveals patient awake in room. No complaints, stable, in no acute distress. Q15 minute rounds and monitoring via Security Cameras to continue. 

## 2019-03-08 NOTE — ED Notes (Signed)
BEHAVIORAL HEALTH ROUNDING Patient sleeping: No. Patient alert: yes Behavior appropriate: Yes.  ; If no, describe:  Nutrition and fluids offered: yes Toileting and hygiene offered: Yes  Sitter present: q15 minute observations and security camera monitoring   

## 2019-03-08 NOTE — ED Notes (Signed)
Hourly rounding reveals patient sleeping in room. No complaints, stable, in no acute distress. Q15 minute rounds and monitoring via Security Cameras to continue. 

## 2019-03-08 NOTE — ED Notes (Signed)
Report to include Situation, Background, Assessment, and Recommendations received from RN. Patient alert and oriented, warm and dry, in no acute distress. Patient states still has SI with a plan to hang himself with bedding. Pt denies HI, AVH and pain. Patient made aware of Q15 minute rounds and security cameras for their safety. Patient instructed to come to me with needs or concerns.

## 2019-03-08 NOTE — ED Notes (Signed)
Patient took blanket and put it around his neck, stating that he wanted to "just end It". All linen removed from room.

## 2019-03-08 NOTE — ED Notes (Signed)
Pt observed lying in bed  - NAD observed  He states  "miss Peter Parsons  - I am bad- I am just so ready to leave this place - I have not seen the sun  - I am confused on my days"  Pt reassured  1:1 sitter is at his bedside

## 2019-03-08 NOTE — ED Notes (Signed)
BEHAVIORAL HEALTH ROUNDING Patient sleeping: No. Patient aler: yes Behavior appropriate: Yes.  ; If no, describe:  Nutrition and fluids offered: yes Toileting and hygiene offered: Yes  Sitter present: q15 minute observations and security camera monitoring  ENVIRONMENTAL ASSESSMENT Potentially harmful objects out of patient reach: Yes.   Personal belongings secured: Yes.   Patient dressed in hospital provided attire only: Yes.   Plastic bags out of patient reach: Yes.   Patient care equipment (cords, cables, call bells, lines, and drains) shortened, removed, or accounted for: Yes.   Equipment and supplies removed from bottom of stretcher: Yes.   Potentially toxic materials out of patient reach: Yes.   Sharps container removed or out of patient reach: Yes.

## 2019-03-08 NOTE — ED Notes (Signed)
BEHAVIORAL HEALTH ROUNDING Patient sleeping: Yes.   Patient alert and oriented: eyes closed  Appears asleep Behavior appropriate: Yes.  ; If no, describe:  Nutrition and fluids offered: Yes  Toileting and hygiene offered: sleeping Sitter present: q 15 minute observations and security camera monitoring  

## 2019-03-08 NOTE — ED Notes (Signed)
ED BHU  Is the patient under IVC or is there intent for IVC: Yes.   Is the patient medically cleared: Yes.   Is there vacancy in the ED BHU: Yes.   Is the population mix appropriate for patient: Yes.   Is the patient awaiting placement in inpatient or outpatient setting: Yes.  He is awaiting placement by social work  Has the patient had a psychiatric consult: Yes.  He has been cleared by psychiatry  Survey of unit performed for contraband, proper placement and condition of furniture, tampering with fixtures in bathroom, shower, and each patient room: Yes.  ; Findings:  APPEARANCE/BEHAVIOR Calm and cooperative NEURO ASSESSMENT Orientation: oriented to self  Reoriented to time, place and situation   Denies pain Hallucinations: No.None noted (Hallucinations) Speech: Normal Gait: normal RESPIRATORY ASSESSMENT Even  Unlabored respirations  CARDIOVASCULAR ASSESSMENT Pulses equal   regular rate  Skin warm and dry   GASTROINTESTINAL ASSESSMENT no GI complaint EXTREMITIES Full ROM  PLAN OF CARE Provide calm/safe environment. Vital signs assessed twice daily. ED BHU Assessment once each 12-hour shift. Collaborate with intake RN daily or as condition indicates. Assure the ED provider has rounded once each shift. Provide and encourage hygiene. Provide redirection as needed. Assess for escalating behavior; address immediately and inform ED provider.  Assess family dynamic and appropriateness for visitation as needed: Yes.  ; If necessary, describe findings:  Educate the patient/family about BHU procedures/visitation: Yes.  ; If necessary, describe findings:

## 2019-03-08 NOTE — ED Provider Notes (Signed)
-----------------------------------------   1:09 PM on 03/08/2019 -----------------------------------------  Blood pressure 104/68, pulse 91, temperature 98.3 F (36.8 C), temperature source Oral, resp. rate 19, height 5\' 11"  (1.803 m), weight 77.1 kg, SpO2 96 %.  The patient is calm and cooperative at this time.  There have been no acute events since the last update.  Awaiting disposition plan from Behavioral Medicine team.   Blake Divine, MD 03/08/19 [phone]

## 2019-03-09 NOTE — ED Provider Notes (Signed)
-----------------------------------------   6:10 AM on 03/09/2019 -----------------------------------------   Blood pressure 139/64, pulse 89, temperature 98.2 F (36.8 C), temperature source Oral, resp. rate 17, height 5\' 11"  (1.803 m), weight 77.1 kg, SpO2 97 %.  The patient is sleeping at this time.  There have been no acute events since the last update.  Awaiting disposition plan from Behavioral Medicine and/or Social Work team(s).   Paulette Blanch, MD 03/09/19 [phone]

## 2019-03-09 NOTE — ED Notes (Signed)
Hourly rounding reveals patient in room. No complaints, stable, in no acute distress. Q15 minute rounds and monitoring via Security Cameras to continue. 

## 2019-03-09 NOTE — ED Notes (Signed)
IVC/Pending Placement 

## 2019-03-09 NOTE — ED Notes (Signed)
Report to include Situation, Background, Assessment, and Recommendations received from [facility]. Patient alert, warm and dry, in no acute distress. Patient denies HI, VH and pain. Patient states he has SI to hang himself and hears voices telling him "to end it all".  Patient made aware of Q15 minute rounds and security cameras for their safety. Patient instructed to come to me with needs or concerns.

## 2019-03-09 NOTE — ED Notes (Signed)
Hourly rounding reveals patient sleeping in room. No complaints, stable, in no acute distress. Q15 minute rounds and monitoring via Security Cameras to continue. 

## 2019-03-09 NOTE — ED Notes (Signed)
IVC/  PENDING  PLACEMENT 

## 2019-03-09 NOTE — ED Notes (Signed)
Pt given lunch tray.

## 2019-03-09 NOTE — ED Notes (Signed)
Pt's linens changed with assistance from [facility]. Pt provided clean scrub top. Pt eating supper at this time.

## 2019-03-10 NOTE — ED Notes (Signed)
Hourly rounding reveals patient in room. No complaints, stable, in no acute distress. Q15 minute rounds and monitoring via Security Cameras to continue. 

## 2019-03-10 NOTE — ED Notes (Signed)
Hourly rounding reveals patient sleeping in room. No complaints, stable, in no acute distress. Q15 minute rounds and monitoring via Security Cameras to continue. 

## 2019-03-10 NOTE — ED Notes (Signed)
BEHAVIORAL HEALTH ROUNDING Patient sleeping: No. Patient alert: yes Behavior appropriate: Yes.  ; If no, describe:  Nutrition and fluids offered: yes Toileting and hygiene offered: Yes  Sitter present: q15 minute observations and security camera monitoring   

## 2019-03-10 NOTE — ED Notes (Signed)
BEHAVIORAL HEALTH ROUNDING Patient sleeping: No. Patient alert : yes Behavior appropriate: Yes.  ; If no, describe:  Nutrition and fluids offered: yes Toileting and hygiene offered: Yes  Sitter present: q15 minute observations and security camera monitoring    ENVIRONMENTAL ASSESSMENT Potentially harmful objects out of patient reach: Yes.   Personal belongings secured: Yes.   Patient dressed in hospital provided attire only: Yes.   Plastic bags out of patient reach: Yes.   Patient care equipment (cords, cables, call bells, lines, and drains) shortened, removed, or accounted for: Yes.   Equipment and supplies removed from bottom of stretcher: Yes.   Potentially toxic materials out of patient reach: Yes.   Sharps container removed or out of patient reach: Yes.   

## 2019-03-10 NOTE — ED Notes (Signed)
Report to include Situation, Background, Assessment, and Recommendations received from [facility]. Patient alert, warm and dry, in no acute distress. Patient denies HI, AVH and pain. Patient states he has SI without plan. Patient made aware of Q15 minute rounds and security cameras for their safety. Patient instructed to come to me with needs or concerns.

## 2019-03-10 NOTE — ED Notes (Signed)
ED BHU  Is the patient under IVC or is there intent for IVC: Yes.   Is the patient medically cleared: Yes.   Is there vacancy in the ED BHU: Yes.   Is the population mix appropriate for patient: Yes.   Is the patient awaiting placement in inpatient or outpatient setting: Yes. Social work is finding him a safe place to live  He has a guardian   Has the patient had a psychiatric consult: Yes.  He has been cleared by psychiatry Survey of unit performed for contraband, proper placement and condition of furniture, tampering with fixtures in bathroom, shower, and each patient room: Yes.  ; Findings:  APPEARANCE/BEHAVIOR Calm and cooperative NEURO ASSESSMENT Orientation: oriented to self and place  Reoriented to time and situation   Denies pain Hallucinations: No.None noted (Hallucinations) Speech: Normal Gait: normal RESPIRATORY ASSESSMENT Even  Unlabored respirations  CARDIOVASCULAR ASSESSMENT Pulses equal   regular rate  Skin warm and dry   GASTROINTESTINAL ASSESSMENT no GI complaint EXTREMITIES Full ROM  PLAN OF CARE Provide calm/safe environment. Vital signs assessed twice daily. ED BHU Assessment once each 12-hour shift.  Assure the ED provider has rounded once each shift. Provide and encourage hygiene. Provide redirection as needed. Assess for escalating behavior; address immediately and inform ED provider.  Assess family dynamic and appropriateness for visitation as needed: Yes.  ; If necessary, describe findings:  Educate the patient/family about BHU procedures/visitation: Yes.  ; If necessary, describe findings:

## 2019-03-10 NOTE — ED Notes (Signed)
Hourly rounding reveals patient in day room. No complaints, stable, in no acute distress. Q15 minute rounds and monitoring via Security Cam[employer] to continue. 

## 2019-03-10 NOTE — ED Notes (Signed)
Patient observed lying in bed with eyes closed  Even, unlabored respirations observed   NAD pt appears to be sleeping  I will continue to monitor along with every 15 minute visual observations and ongoing security camera monitoring    

## 2019-03-11 ENCOUNTER — Other Ambulatory Visit: Payer: Self-pay

## 2019-03-11 NOTE — ED Notes (Signed)
Hourly rounding reveals patient sleeping in room. No complaints, stable, in no acute distress. Q15 minute rounds and monitoring via Security Cameras to continue. 

## 2019-03-11 NOTE — ED Notes (Signed)
IVC/Pending Placement 

## 2019-03-11 NOTE — ED Notes (Signed)
Patient showered this morning, clean clothes provided. Linen changed. Writer went over [employer] and ordered a hot lunch for patient

## 2019-03-11 NOTE — ED Notes (Signed)
dietary calked to place dinner order, reported they already have a ticket for their dinner.  

## 2019-03-11 NOTE — ED Notes (Signed)
Assumed care of patient patient up early lying in bed awake with TV on. Patient denies any pain issues or concerns, reports having bad dreams, patient unable to express what he was dreaming about, corncerned that the facility is going to put him out. Patient reassured that will not happen and that social worker is working on finding placement and a safe place for him to call home. Patient verbalized understanding and was asking o use phone to call his mother. Patient was advised that he may use phone any time during phone hours. Awaiting breakfast. Safety maintained. Will continue to monitor.

## 2019-03-11 NOTE — ED Provider Notes (Signed)
-----------------------------------------   4:35 AM on 03/11/2019 -----------------------------------------   Blood pressure 118/79, pulse (!) 105, temperature 98.8 F (37.1 C), temperature source Oral, resp. rate 18, height 1.803 m (5\' 11" ), weight 77.1 kg, SpO2 98 %.  The patient is calm and cooperative at this time.  There have been no acute events since the last update.  Awaiting disposition plan from Behavioral Medicine and/or Social Work team(s).   Hinda Kehr, MD 03/11/19 [phone]

## 2019-03-12 NOTE — ED Notes (Signed)
Call received from psych NP Cecille Rubin [facility]) asked to speak with patient, requesting update on patient status.

## 2019-03-12 NOTE — ED Provider Notes (Signed)
1:23 AM 1/29  Blood pressure 104/71, pulse 99, temperature 99.4 F (37.4 C), temperature source Oral, resp. rate 17, height 5\' 11"  (1.803 m), weight 77.1 kg, SpO2 100 %.  The patient is calm and cooperative at this time.  There have been no acute events since the last update.  Awaiting disposition plan from Behavioral Medicine team.   Vanessa Williams, MD 03/12/19 [phone]

## 2019-03-12 NOTE — ED Notes (Signed)
Patient offered a shower, patient declined reports took one yesterday. Patient redirected on multiple occassions to stay away from his room door. Patient caught standing by door and swing the door in front of him, when asked why is he doing this he reports he is trying to kill himself. Patient asked to come out into day room and socialize with other patients in day room. Patient very easily redirected.

## 2019-03-12 NOTE — ED Notes (Signed)
Patient continues to go behind door when asked not to. Maintenance  Work order placed # [id number], for removal of door for patient safety.

## 2019-03-12 NOTE — ED Notes (Signed)
Gave [place]  snack tray and lemon lime drink.AS

## 2019-03-12 NOTE — ED Notes (Signed)
Patient asked on multiple occassions not to go behind door, every time patient goes behind door he reports he is going to bang his head until he passes out. He verbalized trying to hurt himself. Redirected  Several times, patient was told if he continues this behavior we will have to remove the door from id room.

## 2019-03-12 NOTE — ED Notes (Signed)
Patient reporting feelings of anxiety, PRN medications given will monitor.

## 2019-03-12 NOTE — ED Notes (Signed)
Patient on phone with parents, expressed being upset parents told him they will visit today did not show up, spoke with mother and father on phone, provided address and directions on how to get to [facility]  Parents expressed they will visit when they can. Will come tomorrow if not the next day but will come visit him. Patient verbalized understanding, left day room and layed in his bed.

## 2019-03-12 NOTE — ED Notes (Signed)
Assumed care of patient this morning. As per prior nurse patient up on and off through out the night. Reports fear and anxiety. Patient redirected to place and safety. Patient sleeping at present time will continue to monitor. Safety maintained.

## 2019-03-12 NOTE — Care Management (Signed)
TOC RN CM: Faxed Fl2 and notes to [place] to [phone]. Fax failed and sent secure to email @ [email or web address] for review.  

## 2019-03-12 NOTE — ED Notes (Signed)
Patient continues to go to room swinging door in front of him and threatening to kill himself. maintenance called at bedside to remove door for patient safety. Door to be replaced when safety is no longer a concern.

## 2019-03-13 NOTE — ED Notes (Signed)
BEHAVIORAL HEALTH ROUNDING Patient sleeping: No. Patient alert : yes Behavior appropriate: Yes.  ; If no, describe:  Nutrition and fluids offered: yes Toileting and hygiene offered: Yes  Sitter present: q15 minute observations and security camera monitoring secrity present: Yes

## 2019-03-13 NOTE — ED Notes (Signed)
BEHAVIORAL HEALTH ROUNDING Patient sleeping: Yes.   Patient alert and oriented: eyes closed  Appears asleep Behavior appropriate: Yes.  ; If no, describe:  Nutrition and fluids offered: Yes  Toileting and hygiene offered: sleeping Sitter present: q 15 minute observations and security camera monitoring Security present: yes  

## 2019-03-13 NOTE — ED Notes (Signed)
ED BHU [place] Is the patient under IVC or is there intent for IVC: Yes.   Is the patient medically cleared: Yes.   Is there vacancy in the ED BHU: Yes.   Is the population mix appropriate for patient: Yes.   Is the patient awaiting placement in inpatient or outpatient setting: Yes.  Social work is finding him a safe place to live  Has the patient had a psychiatric consult: Yes.   Survey of unit performed for contraband, proper placement and condition of furniture, tampering with fixtures in bathroom, shower, and each patient room: Yes.  ; Findings:  APPEARANCE/BEHAVIOR Calm and cooperative NEURO ASSESSMENT Orientation: oriented x3  Denies pain Hallucinations: No.None noted (Hallucinations) denies   Speech: Normal Gait: normal RESPIRATORY ASSESSMENT Even  Unlabored respirations  CARDIOVASCULAR ASSESSMENT Pulses equal   regular rate  Skin warm and dry   GASTROINTESTINAL ASSESSMENT no GI complaint EXTREMITIES Full ROM  PLAN OF CARE Provide calm/safe environment. Vital signs assessed twice daily. ED BHU Assessment once each 12-hour shift.  Assure the ED provider has rounded once each shift. Provide and encourage hygiene. Provide redirection as needed. Assess for escalating behavior; address immediately and inform ED provider.  Assess family dynamic and appropriateness for visitation as needed: Yes.  ; If necessary, describe findings:  Educate the patient/family about BHU procedures/visitation: Yes.  ; If necessary, describe findings:

## 2019-03-13 NOTE — ED Notes (Signed)

## 2019-03-13 NOTE — ED Notes (Signed)
BEHAVIORAL HEALTH ROUNDING Patient sleeping: No. Patient alert  yes Behavior appropriate: Yes.  ; If no, describe:  Nutrition and fluids offered: yes Toileting and hygiene offered: Yes  Sitter present: q15 minute observations and security camera monitoring  security present: Yes    

## 2019-03-13 NOTE — ED Notes (Signed)
IVC pending placement 

## 2019-03-13 NOTE — ED Provider Notes (Signed)
-----------------------------------------   6:13 AM on 03/13/2019 -----------------------------------------   Blood pressure (!) 90/54, pulse 98, temperature 98.6 F (37 C), temperature source Oral, resp. rate 18, height 5\' 11"  (1.803 m), weight 77.1 kg, SpO2 100 %.  The patient is calm and cooperative at this time.  There have been no acute events since the last update.  Awaiting disposition plan from Behavioral Medicine and/or Social Work team(s).   Paulette Blanch, MD 03/13/19 [phone]

## 2019-03-13 NOTE — ED Notes (Signed)
Patient observed lying in bed with eyes closed  Even, unlabored respirations observed   NAD pt appears to be sleeping  I will continue to monitor along with every 15 minute visual observations and ongoing security camera monitoring    

## 2019-03-13 NOTE — ED Notes (Signed)
Pt given dinner tray.  lw edt

## 2019-03-14 MED ORDER — PRAZOSIN HCL 2 MG PO CAPS
2.0000 mg | ORAL_CAPSULE | Freq: Every day | ORAL | Status: DC
  Administered 2019-03-14 – 2019-04-18 (×28): 2 mg via ORAL
  Filled 2019-03-14 (×37): qty 1

## 2019-03-14 NOTE — ED Notes (Signed)
Pt observed lying in bed   Pt visualized with NAD  No verbalized needs or concerns at this time  Continue to monitor 

## 2019-03-14 NOTE — ED Provider Notes (Signed)
-----------------------------------------   6:29 AM on 03/14/2019 -----------------------------------------   Blood pressure 117/81, pulse 81, temperature 98.3 F (36.8 C), temperature source Oral, resp. rate 16, height 5\' 11"  (1.803 m), weight 77.1 kg, SpO2 98 %.  The patient is calm and cooperative at this time.  There have been no acute events since the last update.  Awaiting disposition plan from Behavioral Medicine and/or Social Work team(s).   Paulette Blanch, MD 03/14/19 [phone]

## 2019-03-14 NOTE — Plan of Care (Signed)
Patient seen for status check he is alert and oriented to person place situation when asked to be suffering from auditory hallucinations states "I do not think so" and elaborates he is having nightmares.  We will add prazosin for nightmares continue to await placement

## 2019-03-14 NOTE — ED Notes (Signed)
Pt requested to see the chaplin who was paged and has come to see the patient.

## 2019-03-14 NOTE — ED Notes (Signed)
Pt provided a sandwich tray, drink and ice cream

## 2019-03-14 NOTE — ED Notes (Addendum)
Pt showered and given clean scrub set. Pt brushed teeth.  lw edt

## 2019-03-14 NOTE — ED Notes (Signed)
Pt given meal tray.

## 2019-03-15 NOTE — ED Notes (Signed)
Hourly rounding reveals patient sleeping in room. No complaints, stable, in no acute distress. Q15 minute rounds and monitoring via Security Cameras to continue. 

## 2019-03-15 NOTE — ED Provider Notes (Signed)
-----------------------------------------   5:10 AM on 03/15/2019 -----------------------------------------   Blood pressure 112/88, pulse 95, temperature 98.1 F (36.7 C), temperature source Oral, resp. rate 18, height 1.803 m (5\' 11" ), weight 77.1 kg, SpO2 98 %.  The patient is calm and cooperative at this time.  There have been no acute events since the last update.  Awaiting disposition plan from Behavioral Medicine and/or Social Work team(s).   Hinda Kehr, MD 03/15/19 [phone]

## 2019-03-15 NOTE — Care Management (Addendum)
TOC RN CM: Contacted Cassandra @ [facility] who states they are not able to accomodate patient at this time.   TOC RN CM called to Barbara @ [facility] [phone], requested Fl2 be faxed to [phone]. RN faxed over information for review.   TOC RN CM faxed Fl2 to [facility] @ [phone] for review.   TOC RN CM faxed Fl2 to [facility] @ [phone] for review.  

## 2019-03-15 NOTE — ED Notes (Signed)
Nurse gave Patient His lunch tray, and He said " good" Patient is calm and cooperative.

## 2019-03-15 NOTE — ED Notes (Signed)
Patient came up to nursing station and was clearing throat, and states " I think I have covid, nurse ask him if he could taste and He said ' it is kind of flat, Nurse let him know that she would let the Doctor know His concerns, He was calm and cooperative, will continue to monitor. Patient v/s stable, He is afebrile.

## 2019-03-15 NOTE — ED Notes (Signed)
Nurse talked to Patient's mom and she ask about how He was doing, and states " we Love him and want what is best, but we have health issues and if I could do something I would, but it is in [place] control" Nurse let her know that He had slept good and had good appetite, and any concerns would be reported to the Doctor.

## 2019-03-15 NOTE — ED Notes (Signed)
Report to include Situation, Background, Assessment, and Recommendations received from Wendy RN. Patient alert, warm and dry, in no acute distress. Patient denies SI, HI, AVH and pain. Patient made aware of Q15 minute rounds and security cameras for their safety. Patient instructed to come to me with needs or concerns. 

## 2019-03-16 NOTE — Consult Note (Signed)
[facility] Face-to-Face Psychiatry Consult   Reason for Consult:  Wandering behaviors at his facility Referring Physician:  EDP Patient Identification: Peter Parsons MRN:  [id number] Principal Diagnosis: Paranoid schizophrenia [facility]) Diagnosis:  Principal Problem:   Paranoid schizophrenia ([place]) Active Problems:   Altered mental status    Patient reevaluated 2 2 2021: Patient continues to exhibit wandering behavior. Often times patient is confused and makes illogical statements. Patient is able to be redirected and has not shown any violence or aggression. Continues to take his medication as directed and be compliant with treatment. Will occasionally endorse some paranoid thoughts but not to the point where they affect his behavior.  Today patient was formally tested by using a Montreal cognitive assessment. This assessment showed a score of 19 out of 30 which is indicative of moderate cognitive impairment. This is consistent with with the patient's diagnosis of dementia.  Patient is stable from a psychiatric perspective at this time awaiting appropriate placement.   Original Note from NP on 02/14/2019   Total Time spent with patient: 30 minutes  Subjective:   Peter Parsons is a 59 y.o. male patient admitted with decline in cognition and increase in confusion.  "I think I am going to hell."  Patient seen and evaluated in person by this provider.  Continues to be confused  but easily redirectable.   He appears to be at his new baseline with cognitive decline and need of a locked facility like a SNF or memory care.  He is not responding to internal stimuli and denies hallucinations, no suicidal or homicidal ideations.  His ACT team along with his facility believes Mr. Paratore has dementia related to his behaviors and declining cognition along with his parents both have a dementia.  He no longer can be safe at the group home as he continues to wander away prompting a threat to himself.  Social work  consult placed for memory care or SNF or other locked facility for safety purposes.  HPI on admission:  Peter Parsons is a 59 y.o. male patient Presented to [facility] ED via law enforcement under involuntary commitment status (IVC).  Per the ED triage nursing note, the patient has been leaving his group home a lot lately and wandering into the woods and vacant homes. The patient has a history of dementia and schizophrenia.  The patient voiced to the ED triage nurse, he is afraid of himself, and when pressed states, he is worried that he might hurt himself.  The patient was seen face-to-face by this provider; chart reviewed and consulted with Dr. Joni Fears on 02/11/2019 due to the patient's care. It was discussed with the EDP that the patient does meet criteria to be admitted to the geriatric psychiatric inpatient unit.  On evaluation, the patient is alert, confused,  calm, and cooperative and mood-congruent with affect. The patient does not appear to be responding to internal or external stimuli. The patient is presenting with delusional thinking. The patient is unable to answer if he is experiencing auditory or visual hallucinations. The patient denies suicidal, homicidal, or self-harm ideations. The patient is presenting with psychotic and paranoid behaviors. During an encounter with the patient, he was unable to answer questions appropriately.  Plan: The patient is a safety risk to self and does require geriatric psychiatric inpatient admission for stabilization and treatment.  HPI: Per Dr. Joni Fears; Peter Parsons a 59 y.o.malewith a history of diabetes hypertension and schizophrenia who is brought to the ED under involuntary commitment from his group home  due to wandering off into the woods multiple times. Patient reports suicidal thoughts, with ideas of drowning himself. While enforcement noted that patient has been found by bodies of water multiple times when he wanders away from the group home.  Patient denies any acute complaints.He denies hallucinations. Denies HI.  Past Psychiatric History: schizophrenia  Risk to Self: None Risk to Others: Homicidal Ideation: No Thoughts of Harm to Others: No Current Homicidal Intent: No Current Homicidal Plan: No Access to Homicidal Means: No Identified Victim: Reports of none History of harm to others?: No Assessment of Violence: None Noted Violent Behavior Description: Reports of none Does patient have access to weapons?: No Criminal Charges Pending?: No Describe Pending Criminal Charges: Reports of none Does patient have a court date: No Prior Inpatient Therapy: Prior Inpatient Therapy: Yes Prior Therapy Dates: 01/2019, 12/2018 & 05/2018 Prior Therapy Facilty/Provider(s): [facility] Reason for Treatment: Schizophrenia Prior Outpatient Therapy: Prior Outpatient Therapy: Yes Prior Therapy Dates: Current Prior Therapy Facilty/Provider(s): [facility] Reason for Treatment: Schizophrenia([facility]) Does patient have an ACCT team?: Yes Does patient have Intensive In-House Services?  : No Does patient have [facility] services? : No Does patient have P4CC services?: No  Past Medical History:  Past Medical History:  Diagnosis Date  . Diabetes mellitus without complication ([place])   . Hypertension   . Schizophrenia ([place])    History reviewed. No pertinent surgical history. Family History: History reviewed. No pertinent family history. Family Psychiatric  History: none Social History:  Social History   Substance and Sexual Activity  Alcohol Use Not Currently     Social History   Substance and Sexual Activity  Drug Use Not Currently    Social History   Socioeconomic History  . Marital status: Single    Spouse name: Not on file  . Number of children: Not on file  . Years of education: Not on file  . Highest education level: Not on file  Occupational History   . Not on file  Tobacco Use  . Smoking status: Former Research scientist (life sciences)  . Smokeless tobacco: Never Used  Substance and Sexual Activity  . Alcohol use: Not Currently  . Drug use: Not Currently  . Sexual activity: Not on file  Other Topics Concern  . Not on file  Social History Narrative  . Not on file   Social Determinants of Health   Financial Resource Strain:   . Difficulty of Paying Living Expenses: Not on file  Food Insecurity:   . Worried About Charity fundraiser in the Last Year: Not on file  . Ran Out of Food in the Last Year: Not on file  Transportation Needs:   . Lack of Transportation (Medical): Not on file  . Lack of Transportation (Non-Medical): Not on file  Physical Activity:   . Days of Exercise per Week: Not on file  . Minutes of Exercise per Session: Not on file  Stress:   . Feeling of Stress : Not on file  Social Connections:   . Frequency of Communication with Friends and Family: Not on file  . Frequency of Social Gatherings with Friends and Family: Not on file  . Attends Religious Services: Not on file  . Active Member of Clubs or Organizations: Not on file  . Attends Archivist Meetings: Not on file  . Marital Status: Not on file   Additional Social History:    Allergies:   Allergies  Allergen Reactions  .  Acetaminophen Other (See Comments)    Unknown Unable to breathe Chest  tightness/SOB     Labs:  No results found for this or any previous visit (from the past 48 hour(s)).  Current Facility-Administered Medications  Medication Dose Route Frequency Provider Last Rate Last Admin  . atorvastatin (LIPITOR) tablet 10 mg  10 mg Oral Daily Carrie Mew, MD   10 mg at 03/16/19 0929  . clonazePAM (KLONOPIN) tablet 0.25 mg  0.25 mg Oral TID PRN Dixie Dials, MD   0.25 mg at 03/15/19 1601  . gabapentin (NEURONTIN) capsule 300 mg  300 mg Oral TID Carrie Mew, MD   300 mg at 03/16/19 [id number]  . lisinopril (ZESTRIL) tablet 20 mg  20 mg  Oral Daily Carrie Mew, MD   20 mg at 03/16/19 [id number]  . metFORMIN (GLUCOPHAGE) tablet 500 mg  500 mg Oral BID WC Carrie Mew, MD   500 mg at 03/16/19 0929  . prazosin (MINIPRESS) capsule 2 mg  2 mg Oral QHS Johnn Hai, MD   Stopped at 03/15/19 2156  . risperiDONE (RISPERDAL) tablet 3 mg  3 mg Oral BID Carrie Mew, MD   3 mg at 03/16/19 [id number]  . tamsulosin (FLOMAX) capsule 0.4 mg  0.4 mg Oral Daily Carrie Mew, MD   0.4 mg at 03/16/19 0929  . traZODone (DESYREL) tablet 150 mg  150 mg Oral QHS Carrie Mew, MD   150 mg at 03/15/19 2155   Current Outpatient Medications  Medication Sig Dispense Refill  . atorvastatin (LIPITOR) 10 MG tablet Take 10 mg by mouth daily.    Marland Kitchen gabapentin (NEURONTIN) 300 MG capsule Take 300 mg by mouth 3 (three) times daily.    Marland Kitchen lisinopril (ZESTRIL) 20 MG tablet Take 1 tablet (20 mg total) by mouth daily. 30 tablet 0  . metFORMIN (GLUCOPHAGE) 500 MG tablet Take 1 tablet (500 mg total) by mouth 2 (two) times daily with a meal. 30 tablet 0  . paliperidone (INVEGA SUSTENNA) 234 MG/1.5ML SUSY injection Inject 234 mg into the muscle every 28 (twenty-eight) days. 1.8 mL 0  . risperiDONE (RISPERDAL) 3 MG tablet Take 1 tablet (3 mg total) by mouth 2 (two) times daily. 45 tablet 0  . tamsulosin (FLOMAX) 0.4 MG CAPS capsule Take 0.4 mg by mouth daily.    . traZODone (DESYREL) 150 MG tablet Take 150 mg by mouth at bedtime.       Musculoskeletal: Strength & Muscle Tone: within normal limits Gait & Station: normal Patient leans: N/A  Psychiatric Specialty Exam: Physical Exam  Nursing note and vitals reviewed. Constitutional: He is oriented to person, place, and time. He appears well-developed and well-nourished.  HENT:  Head: Normocephalic.  Respiratory: Effort normal.  Musculoskeletal:        General: Normal range of motion.     Cervical back: Normal range of motion.  Neurological: He is alert and oriented to person, place, and time.   Psychiatric: His speech is normal and behavior is normal. His mood appears anxious. His affect is blunt. Thought content is delusional. Cognition and memory are impaired. He expresses inappropriate judgment.    Review of Systems  Psychiatric/Behavioral: Positive for confusion. The patient is nervous/anxious.   All other systems reviewed and are negative.   Blood pressure 131/74, pulse (!) 113, temperature 98.6 F (37 C), temperature source Oral, resp. rate 18, height 5\' 11"  (1.803 m), weight 77.1 kg, SpO2 99 %.Body mass index is 23.71 kg/m.  General Appearance: Casual  Eye Contact:  Fair  Speech:  Normal Rate  Volume:  Normal  Mood:  Anxious  Affect:  Blunt  Thought Process: Confused at times  Orientation:  Full (Time, Place, and Person)  Thought Content:  confused at times  Suicidal Thoughts:  No  Homicidal Thoughts:  No  Memory:  Immediate;   Poor Recent;   Poor Remote;   Poor  Judgement:  Impaired  Insight:  Lacking  Psychomotor Activity:  Normal  Concentration:  Concentration: Poor and Attention Span: Poor  Recall:  Poor  Fund of Knowledge:  Fair  Language:  Fair  Akathisia:  No  Handed:  Right  AIMS (if indicated):     Assets:  Leisure Time Resilience  ADL's:  Intact  Cognition:  Impaired,  Mild  Sleep:      59 year old male admitted for wandering away from his group home with concerns about him having dementia, related to his behavior and both parents having dementia.  History of schizophrenia.  Appears to be at his new baseline with placement into a locked facility necessary to maintain safety for wandering behaviors.  Diagnosis: Dementia, Schizophrenia  Treatment Plan Summary: Continue current medication regimen  Disposition: Locked facility, SNF, or memory care unit  Dixie Dials, MD 03/16/2019 2:54 PM

## 2019-03-16 NOTE — ED Notes (Signed)
Report to include Situation, Background, Assessment, and Recommendations received from Jeannette RN. Patient alert, warm and dry, in no acute distress. Patient denies SI, HI, AVH and pain. Patient made aware of Q15 minute rounds and security cameras for their safety. Patient instructed to come to me with needs or concerns. 

## 2019-03-16 NOTE — ED Notes (Signed)
Assumed care of patient. Patient up early sitting on edge of bed. Patient reports did not sleep well, reports having dreams that kept him up. When asked what kind of dreams keep him up patient reports dreams about killing. When patient was asked to tell me more about these dreams, patient reports feeling confused, unable to explain what bad dreams keep him up at night. VSS this morning. Safety maintained. Will monitor.

## 2019-03-16 NOTE — ED Notes (Signed)
Patient in shower, all supplies retrieved upon completion. Bed linen changed. Safety maintained. Will continue to monitor.

## 2019-03-16 NOTE — ED Notes (Signed)
Hourly rounding reveals patient in room. No complaints, stable, in no acute distress. Q15 minute rounds and monitoring via Security Cameras to continue. 

## 2019-03-16 NOTE — ED Notes (Signed)
Hourly rounding reveals patient awake in room. No complaints, stable, in no acute distress. Q15 minute rounds and monitoring via Security Cameras to continue. 

## 2019-03-16 NOTE — ED Notes (Signed)
Hourly rounding reveals patient sleeping in room. No complaints, stable, in no acute distress. Q15 minute rounds and monitoring via Security Cameras to continue. 

## 2019-03-16 NOTE — ED Notes (Signed)
Patient evaluated by psych further plan of are pending social workers placement pending.

## 2019-03-16 NOTE — ED Notes (Signed)
Breakfast provided, patient wanted to eat in day room. Patient only consumed a hash brown and his coffee. Did not want to eat any more went into his room and layed down.

## 2019-03-16 NOTE — ED Notes (Signed)
Social worker/ placement pending.

## 2019-03-16 NOTE — ED Notes (Signed)
Hourly rounding reveals patient in day room. No complaints, stable, in no acute distress. Q15 minute rounds and monitoring via Security Cam[employer] to continue. 

## 2019-03-17 LAB — CBC
HCT: 31.7 % — ABNORMAL LOW (ref 39.0–52.0)
Hemoglobin: 11.1 g/dL — ABNORMAL LOW (ref 13.0–17.0)
MCH: 29.9 pg (ref 26.0–34.0)
MCHC: 35 g/dL (ref 30.0–36.0)
MCV: 85.4 fL (ref 80.0–100.0)
Platelets: 111 10*3/uL — ABNORMAL LOW (ref 150–400)
RBC: 3.71 MIL/uL — ABNORMAL LOW (ref 4.22–5.81)
RDW: 13.6 % (ref 11.5–15.5)
WBC: 2.9 10*3/uL — ABNORMAL LOW (ref 4.0–10.5)
nRBC: 0 % (ref 0.0–0.2)

## 2019-03-17 LAB — COMPREHENSIVE METABOLIC PANEL
ALT: 13 U/L (ref 0–44)
AST: 17 U/L (ref 15–41)
Albumin: 4.3 g/dL (ref 3.5–5.0)
Alkaline Phosphatase: 61 U/L (ref 38–126)
Anion gap: 11 (ref 5–15)
BUN: 41 mg/dL — ABNORMAL HIGH (ref 6–20)
CO2: 28 mmol/L (ref 22–32)
Calcium: 9.2 mg/dL (ref 8.9–10.3)
Chloride: 102 mmol/L (ref 98–111)
Creatinine, Ser: 1.53 mg/dL — ABNORMAL HIGH (ref 0.61–1.24)
GFR calc Af Amer: 56 mL/min — ABNORMAL LOW (ref >60)
GFR calc non Af Amer: 49 mL/min — ABNORMAL LOW (ref >60)
Glucose, Bld: 133 mg/dL — ABNORMAL HIGH (ref 70–99)
Potassium: 4.4 mmol/L (ref 3.5–5.1)
Sodium: 141 mmol/L (ref 135–145)
Total Bilirubin: 0.7 mg/dL (ref 0.3–1.2)
Total Protein: 7.4 g/dL (ref 6.5–8.1)

## 2019-03-17 LAB — VITAMIN B12: Vitamin B-12: 172 pg/mL — ABNORMAL LOW (ref 180–914)

## 2019-03-17 LAB — FOLATE: Folate: 10.5 ng/mL (ref >5.9)

## 2019-03-17 MED ORDER — ADULT MULTIVITAMIN W/MINERALS CH
1.0000 | ORAL_TABLET | Freq: Every day | ORAL | Status: DC
  Administered 2019-03-17 – 2019-07-11 (×113): 1 via ORAL
  Filled 2019-03-17 (×119): qty 1

## 2019-03-17 MED ORDER — ESCITALOPRAM OXALATE 10 MG PO TABS
5.0000 mg | ORAL_TABLET | Freq: Every day | ORAL | Status: DC
  Administered 2019-03-17 – 2019-03-29 (×13): 5 mg via ORAL
  Filled 2019-03-17: qty 0.5
  Filled 2019-03-17 (×2): qty 1
  Filled 2019-03-17: qty 0.5
  Filled 2019-03-17: qty 1
  Filled 2019-03-17: qty 0.5
  Filled 2019-03-17 (×8): qty 1

## 2019-03-17 NOTE — NC FL2 (Signed)
[place] LEVEL OF CARE SCREENING TOOL     IDENTIFICATION  Patient Name: Peter Parsons Birthdate: 10-29-1960 Sex: male Admission Date (Current Location): 02/11/2019  [place] and [place] Number:  Engineering geologist and Address:  [facility], [address], [place], [place] [address]      Provider Number: [id number]  Attending Physician Name and Address:  No att. providers found  Relative Name and Phone Number:       Current Level of Care: Hospital Recommended Level of Care: [place] Prior Approval Number:    Date Approved/Denied:   PASRR Number:    Discharge Plan: Other (Comment)(Memory Care)    Current Diagnoses: Patient Active Problem List   Diagnosis Date Noted  . Altered mental status   . Paranoid schizophrenia ([place])     Orientation RESPIRATION BLADDER Height & Weight     Self, Time, Situation, Place  Normal Continent Weight: 170 lb (77.1 kg) Height:  5\' 11"  (180.3 cm)  BEHAVIORAL SYMPTOMS/MOOD NEUROLOGICAL BOWEL NUTRITION STATUS  Wanderer   Continent Diet  AMBULATORY STATUS COMMUNICATION OF NEEDS Skin   Independent Verbally Normal                       Personal Care Assistance Level of Assistance              Functional Limitations Info             SPECIAL CARE FACTORS FREQUENCY                       Contractures      Additional Factors Info                  Current Medications (03/17/2019):  This is the current hospital active medication list Current Facility-Administered Medications  Medication Dose Route Frequency Provider Last Rate Last Admin  . atorvastatin (LIPITOR) tablet 10 mg  10 mg Oral Daily Carrie Mew, MD   10 mg at 03/17/19 0950  . clonazePAM (KLONOPIN) tablet 0.25 mg  0.25 mg Oral TID PRN Dixie Dials, MD   0.25 mg at 03/15/19 1601  . gabapentin (NEURONTIN) capsule 300 mg  300 mg Oral TID Carrie Mew, MD   300 mg at 03/17/19 0949  .  lisinopril (ZESTRIL) tablet 20 mg  20 mg Oral Daily Carrie Mew, MD   20 mg at 03/17/19 0949  . metFORMIN (GLUCOPHAGE) tablet 500 mg  500 mg Oral BID WC Carrie Mew, MD   500 mg at 03/17/19 0949  . prazosin (MINIPRESS) capsule 2 mg  2 mg Oral QHS Johnn Hai, MD   2 mg at 03/16/19 2107  . risperiDONE (RISPERDAL) tablet 3 mg  3 mg Oral BID Carrie Mew, MD   3 mg at 03/17/19 0950  . tamsulosin (FLOMAX) capsule 0.4 mg  0.4 mg Oral Daily Carrie Mew, MD   0.4 mg at 03/17/19 0950  . traZODone (DESYREL) tablet 150 mg  150 mg Oral QHS Carrie Mew, MD   150 mg at 03/16/19 2107   Current Outpatient Medications  Medication Sig Dispense Refill  . atorvastatin (LIPITOR) 10 MG tablet Take 10 mg by mouth daily.    Marland Kitchen gabapentin (NEURONTIN) 300 MG capsule Take 300 mg by mouth 3 (three) times daily.    Marland Kitchen lisinopril (ZESTRIL) 20 MG tablet Take 1 tablet (20 mg total) by mouth daily. 30 tablet 0  . metFORMIN (GLUCOPHAGE) 500 MG  tablet Take 1 tablet (500 mg total) by mouth 2 (two) times daily with a meal. 30 tablet 0  . paliperidone (INVEGA SUSTENNA) 234 MG/1.5ML SUSY injection Inject 234 mg into the muscle every 28 (twenty-eight) days. 1.8 mL 0  . risperiDONE (RISPERDAL) 3 MG tablet Take 1 tablet (3 mg total) by mouth 2 (two) times daily. 45 tablet 0  . tamsulosin (FLOMAX) 0.4 MG CAPS capsule Take 0.4 mg by mouth daily.    . traZODone (DESYREL) 150 MG tablet Take 150 mg by mouth at bedtime.        Discharge Medications: Please see discharge summary for a list of discharge medications.  Relevant Imaging Results:  Relevant Lab Results:   Additional Information SS# [id number]  Adelene Amas, LCSWA

## 2019-03-17 NOTE — NC FL2 (Signed)
[place] LEVEL OF CARE SCREENING TOOL     IDENTIFICATION  Patient Name: Peter Parsons Birthdate: 10/20/1960 Sex: male Admission Date (Current Location): 02/11/2019  [place] and [place] Number:  Engineering geologist and Address:  [facility], [address], [place], [place] [address]      Provider Number: [id number]  Attending Physician Name and Address:  No att. providers found  Relative Name and Phone Number:       Current Level of Care: Hospital Recommended Level of Care: [place] Prior Approval Number:    Date Approved/Denied:   PASRR Number:    Discharge Plan: Other (Comment)(Memory Care)    Current Diagnoses: Patient Active Problem List   Diagnosis Date Noted  . Altered mental status   . Paranoid schizophrenia ([place])        Dementia   Orientation RESPIRATION BLADDER Height & Weight     Self, Time, Situation, Place  Normal Continent Weight: 170 lb (77.1 kg) Height:  5\' 11"  (180.3 cm)  BEHAVIORAL SYMPTOMS/MOOD NEUROLOGICAL BOWEL NUTRITION STATUS  Wanderer   Continent Diet  AMBULATORY STATUS COMMUNICATION OF NEEDS Skin   Independent Verbally Normal                       Personal Care Assistance Level of Assistance              Functional Limitations Info             SPECIAL CARE FACTORS FREQUENCY                       Contractures      Additional Factors Info                  Current Medications (03/17/2019):  This is the current hospital active medication list Current Facility-Administered Medications  Medication Dose Route Frequency Provider Last Rate Last Admin  . atorvastatin (LIPITOR) tablet 10 mg  10 mg Oral Daily Carrie Mew, MD   10 mg at 03/17/19 0950  . clonazePAM (KLONOPIN) tablet 0.25 mg  0.25 mg Oral TID PRN Dixie Dials, MD   0.25 mg at 03/15/19 1601  . gabapentin (NEURONTIN) capsule 300 mg  300 mg Oral TID Carrie Mew, MD   300 mg at  03/17/19 0949  . lisinopril (ZESTRIL) tablet 20 mg  20 mg Oral Daily Carrie Mew, MD   20 mg at 03/17/19 0949  . metFORMIN (GLUCOPHAGE) tablet 500 mg  500 mg Oral BID WC Carrie Mew, MD   500 mg at 03/17/19 0949  . prazosin (MINIPRESS) capsule 2 mg  2 mg Oral QHS Johnn Hai, MD   2 mg at 03/16/19 2107  . risperiDONE (RISPERDAL) tablet 3 mg  3 mg Oral BID Carrie Mew, MD   3 mg at 03/17/19 0950  . tamsulosin (FLOMAX) capsule 0.4 mg  0.4 mg Oral Daily Carrie Mew, MD   0.4 mg at 03/17/19 0950  . traZODone (DESYREL) tablet 150 mg  150 mg Oral QHS Carrie Mew, MD   150 mg at 03/16/19 2107   Current Outpatient Medications  Medication Sig Dispense Refill  . atorvastatin (LIPITOR) 10 MG tablet Take 10 mg by mouth daily.    Marland Kitchen gabapentin (NEURONTIN) 300 MG capsule Take 300 mg by mouth 3 (three) times daily.    Marland Kitchen lisinopril (ZESTRIL) 20 MG tablet Take 1 tablet (20 mg total) by mouth daily. 30 tablet 0  .  metFORMIN (GLUCOPHAGE) 500 MG tablet Take 1 tablet (500 mg total) by mouth 2 (two) times daily with a meal. 30 tablet 0  . paliperidone (INVEGA SUSTENNA) 234 MG/1.5ML SUSY injection Inject 234 mg into the muscle every 28 (twenty-eight) days. 1.8 mL 0  . risperiDONE (RISPERDAL) 3 MG tablet Take 1 tablet (3 mg total) by mouth 2 (two) times daily. 45 tablet 0  . tamsulosin (FLOMAX) 0.4 MG CAPS capsule Take 0.4 mg by mouth daily.    . traZODone (DESYREL) 150 MG tablet Take 150 mg by mouth at bedtime.        Discharge Medications: Please see discharge summary for a list of discharge medications.  Relevant Imaging Results:  Relevant Lab Results:   Additional Information SS# [id number]  Adelene Amas, LCSWA

## 2019-03-17 NOTE — ED Notes (Signed)
Patient laying in bed awake. Safety maintained. Will monitor.

## 2019-03-17 NOTE — ED Notes (Signed)
Hourly rounding reveals patient sleeping in room. No complaints, stable, in no acute distress. Q15 minute rounds and monitoring via Security Cameras to continue. 

## 2019-03-17 NOTE — ED Notes (Signed)
Give patient clean set of uniforms ,patient still refused shower.

## 2019-03-17 NOTE — ED Notes (Signed)
Pt. Given additional water and encouraged to drink.

## 2019-03-17 NOTE — ED Notes (Signed)
IVC Renewed/Still Pending Placement 

## 2019-03-17 NOTE — Care Management (Signed)
TOC RN CM called to Barbara @ [facility] [phone], requested Fl2 be faxed to [phone]. RN faxed over information for review. **States she did not receive the Fl2 and requested information be refaxed.   TOC RN CM call to [facility] @ [phone] for follow up on Fl2-states Blondie is who makes that decision and she is not available at this time. Requested callback later today.    TOC RN CM: Call to [facility] @ [phone] for follow up on Fl2 review. Left contact info for callback.  

## 2019-03-17 NOTE — ED Notes (Signed)
on phone with social service as per patient. called to check on him.

## 2019-03-17 NOTE — ED Notes (Signed)
Labs drawn and sent this morning. Patient ate 70% of his breakfast. Scheduled meds given. Patient refused shower this morning. Requested clean clothes.

## 2019-03-17 NOTE — ED Notes (Signed)
Assumed care of patient, patient up laying across his bed. Reports did not sleep well last night. Patient states was having a lot of dreams, unable to clearly explain what the dreams were about. Patient oriented to his environment and assured he was in a safe environment. Will continue to monitor.

## 2019-03-18 LAB — THYROID PANEL WITH TSH
Free Thyroxine Index: 2.2 (ref 1.2–4.9)
T3 Uptake Ratio: 29 % (ref 24–39)
T4, Total: 7.7 ug/dL (ref 4.5–12.0)
TSH: 1.33 u[IU]/mL (ref 0.450–4.500)

## 2019-03-18 NOTE — ED Notes (Signed)
Pt given meal tray.

## 2019-03-18 NOTE — Evaluation (Signed)
Physical Therapy Evaluation Patient Details Name: Peter Parsons MRN: [id number] DOB: 03-10-1960 Today's Date: 03/18/2019   History of Present Illness  Pt is a 59 y.o. male presenting to hospital 02/11/20 with IVC from group home d/t wandering off into woods multiple times (found near water); pt reporting suicidal thoughts of drowning himself.  Pt tripped on his own feet and hit chin on chair this morning 2/4 (pt got back up and walked to restroom independently).  Unable to return to group home d/t safety concerns and per notes seeking memory care unit.  Per chart review pt scored 19/30 on [facility] cognitive assessment with psychiatry indicative of moderate cognitive impairment (consistent with pt's diagnosis of dementia); also psychiatry discussing placement into locked facility necessary to maintain safety for wandering behaviors.  PMH includes DM, htn, schizophrenia.  Clinical Impression  Prior to hospital admission, pt was independent with ambulation.  Pt reports no physical pain after fall this morning.  Currently pt is modified independent with bed mobility; independent with transfers; and CGA with ambulation (no AD).  Pt with a couple mild altered stepping pattern/loss of balance to side during ambulation but pt able to self correct.  Pt demonstrating more of a shuffling type gait pattern with mild flexed posture and flat affect (discussed these impairments/symptoms with pt's nurse who relayed information to MD).  Nurse reports pt has had a shuffling type gait since she has known him beginning of January and this has slowly been progressive.  Pt able to take longer steps with heel-strike with cueing but pt unable to maintain (pt reporting feeling like he had better balance with shorter steps).  Pt would benefit from skilled PT to address noted impairments and functional limitations (see below for any additional details).  Upon hospital discharge, pt would benefit from OP PT.    Follow Up  Recommendations Outpatient PT    Equipment Recommendations  None recommended by PT    Recommendations for Other Services       Precautions / Restrictions Precautions Precautions: Fall Precaution Comments: Suicide precautions; IVC Restrictions Weight Bearing Restrictions: No      Mobility  Bed Mobility Overal bed mobility: Modified Independent             General bed mobility comments: Supine to/from sit with mild increased effort (no assist required)  Transfers Overall transfer level: Independent Equipment used: None             General transfer comment: fairly strong stand; steady  Ambulation/Gait Ambulation/Gait assistance: Min guard Gait Distance (Feet): (200 feet x2) Assistive device: None   Gait velocity: decreased   General Gait Details: pt initially with shuffling type gait requiring vc's to take longer steps and for heelstrike B  Stairs            Wheelchair Mobility    Modified Rankin (Stroke Patients Only)       Balance Overall balance assessment: Needs assistance Sitting-balance support: No upper extremity supported;Feet supported Sitting balance-Leahy Scale: Normal Sitting balance - Comments: steady sitting reaching outside BOS   Standing balance support: No upper extremity supported Standing balance-Leahy Scale: Good Standing balance comment: steady standing reaching just outside BOS B (pt appearing cautious with this though)                             Pertinent Vitals/Pain Pain Assessment: No/denies pain  HR WFL during session's activities.    Home Living Family/patient expects to be  discharged to:: Group home                 Additional Comments: Per chart unable to return to group home d/t safety concerns    Prior Function Level of Independence: Independent         Comments: Ambulatory no AD     Hand Dominance        Extremity/Trunk Assessment   Upper Extremity Assessment Upper  Extremity Assessment: Generalized weakness    Lower Extremity Assessment Lower Extremity Assessment: Generalized weakness(intact B LE heel to shin coordination, light touch, proprioception, and tone.  4+/5 B hip flexion, knee flexion/extension, and DF/PF.)    Cervical / Trunk Assessment Cervical / Trunk Assessment: Normal  Communication   Communication: No difficulties  Cognition Arousal/Alertness: Awake/alert Behavior During Therapy: Flat affect Overall Cognitive Status: Within Functional Limits for tasks assessed                                 General Comments: Alert and oriented to person and place and situation      General Comments   Nursing cleared pt for participation in physical therapy.  Pt agreeable to PT session.    Exercises  Gait training   Assessment/Plan    PT Assessment Patient needs continued PT services  PT Problem List Decreased strength;Decreased balance;Decreased mobility       PT Treatment Interventions Gait training;Functional mobility training;Therapeutic activities;Therapeutic exercise;Balance training;Patient/family education    PT Goals (Current goals can be found in the Care Plan section)  Acute Rehab PT Goals Patient Stated Goal: to improve balance and walking PT Goal Formulation: With patient Time For Goal Achievement: 04/01/19 Potential to Achieve Goals: Fair    Frequency Min 2X/week   Barriers to discharge        Co-evaluation               AM-PAC PT "6 Clicks" Mobility  Outcome Measure Help needed turning from your back to your side while in a flat bed without using bedrails?: None Help needed moving from lying on your back to sitting on the side of a flat bed without using bedrails?: None Help needed moving to and from a bed to a chair (including a wheelchair)?: None Help needed standing up from a chair using your arms (e.g., wheelchair or bedside chair)?: A Little Help needed to walk in hospital room?: A  Little Help needed climbing 3-5 steps with a railing? : A Little 6 Click Score: 21    End of Session Equipment Utilized During Treatment: Gait belt Activity Tolerance: Patient tolerated treatment well Patient left: in bed Nurse Communication: Mobility status;Precautions PT Visit Diagnosis: Other abnormalities of gait and mobility (R26.89);Muscle weakness (generalized) (M62.81);History of falling (Z91.81)    Time: 1530-1550 PT Time Calculation (min) (ACUTE ONLY): 20 min   Charges:   PT Evaluation $PT Eval Low Complexity: 1 Low PT Treatments $Gait Training: 8-22 mins        Leitha Bleak, PT 03/18/19, 5:02 PM

## 2019-03-18 NOTE — ED Provider Notes (Signed)
Evaluated patient after a fall.  Patient was walking to the restroom when he tripped and fell and hit his chin on the chair.  No LOC, able to get up and ambulate independently immediately afterwards.  In discussion with the patient, he states he was walking too fast and therefore got his feet tripped up underneath him.  He denies any preceding presyncopal symptoms.  He does state that he hit his chin on the chair, but denies any pain related to the area.  No visual changes, no speech difficulties, no weakness, numbness, tingling.  Denies any head, neck, chest, abdominal, hip, or extremity pain.  He is not on any blood thinning medications.  On exam, there is no evidence of any acute traumatic injury.  Nontender to palpation of the face and chin.  No step-offs or deformities. Midface stable. No epistaxis.  No evidence of intraoral or dental trauma.  Jaw is well aligned, no malocclusion.  No midline CS tenderness.  Chest wall stable.  Abdomen nontender.  FROM bilateral shoulders, elbows, wrists, hips, knees, ankles.  Given this, do not feel any emergent imaging is indicated at this time.  We will continue to monitor.  Will obtain a PT consult for assistance/recommendations in the setting of his fall.   Lilia Pro., MD 03/18/19 [phone]

## 2019-03-18 NOTE — ED Notes (Signed)
Gave snack [place] tray and drink.AS

## 2019-03-18 NOTE — ED Notes (Signed)
Fall bracelet and yellow socks placed on pt. No bed alarm due to location of pt.

## 2019-03-18 NOTE — TOC Progression Note (Addendum)
Transition of Care [facility]) - Progression Note    Patient Details  Name: Peter Parsons MRN: [id number] Date of Birth: September 14, 1960  Transition of [facility] CM/SW [place], RN Phone Number: 03/18/2019, 12:34 PM  Clinical Narrative:    Tawni Carnes  To [facility] in [place]  for potential placement. Faxed to [phone].   Call from [facility] @ [place] states no availability in memory care unit and unable to accept patients in ALF or skilled unit.        Expected Discharge Plan and Services                                                 Social Determinants of Health (SDOH) Interventions    Readmission Risk Interventions No flowsheet data found.

## 2019-03-18 NOTE — ED Notes (Signed)
While walking to the restroom, pt tripped over his own feet and hit chin on chair. Pt able to get back up and then walked to restroom independently. Fall witnessed by Garment/textile technologist. Pt denies any pain to chin or pain anywhere else. Pt did not hit head. Pt AOx4 and ambulatory. Discussed with Karl Ito RN and Dr. Joan Mayans.

## 2019-03-18 NOTE — ED Notes (Signed)
Pt ate one applesauce, half a pancake, and half a sausage. Pt drank half of a milk. Pt encouraged to eat. Pt doesn't want the rest of his food. Will continue to encourage.

## 2019-03-18 NOTE — ED Notes (Signed)
PT at bedside.

## 2019-03-19 DIAGNOSIS — F0390 Unspecified dementia without behavioral disturbance: Secondary | ICD-10-CM | POA: Diagnosis present

## 2019-03-19 NOTE — ED Notes (Signed)
IVC/ Still remain Pending Placement

## 2019-03-19 NOTE — NC FL2 (Addendum)
[place] LEVEL OF CARE SCREENING TOOL     IDENTIFICATION  Patient Name: Peter Parsons Birthdate: 25-Jun-1960 Sex: male Admission Date (Current Location): 02/11/2019  [place] and [place] Number:  Engineering geologist and Address:  [facility], [address] [place], [place] [address]      Provider Number: [id number]  Attending Physician Name and Address:  No att. providers found  Relative Name and Phone Number:       Current Level of Care: Hospital Recommended Level of Care: [place] Prior Approval Number:    Date Approved/Denied:   PASRR Number:    Discharge Plan: Other (Comment)(Memory Care)    Current Diagnoses: Patient Active Problem List   Diagnosis Date Noted  . Dementia ([place]) 03/19/2019  . Altered mental status   . Paranoid schizophrenia ([place])     Orientation RESPIRATION BLADDER Height & Weight     Self, Time, Situation, Place  Normal Continent Weight: 170 lb (77.1 kg) Height:  5\' 11"  (180.3 cm)  BEHAVIORAL SYMPTOMS/MOOD NEUROLOGICAL BOWEL NUTRITION STATUS  [place]   Continent Diet  AMBULATORY STATUS COMMUNICATION OF NEEDS Skin   Independent Verbally Normal                       Personal Care Assistance Level of Assistance              Functional Limitations Info             SPECIAL CARE FACTORS FREQUENCY                       Contractures      Additional Factors Info                  Current Medications (03/19/2019):  This is the current hospital active medication list Current Facility-Administered Medications  Medication Dose Route Frequency Provider Last Rate Last Admin  . atorvastatin (LIPITOR) tablet 10 mg  10 mg Oral Daily Carrie Mew, MD   10 mg at 03/19/19 1030  . clonazePAM (KLONOPIN) tablet 0.25 mg  0.25 mg Oral TID PRN Dixie Dials, MD   0.25 mg at 03/18/19 2106  . escitalopram (LEXAPRO) tablet 5 mg  5 mg Oral Daily Cristofano, Dorene Ar, MD    5 mg at 03/19/19 1030  . gabapentin (NEURONTIN) capsule 300 mg  300 mg Oral TID Carrie Mew, MD   300 mg at 03/19/19 1030  . lisinopril (ZESTRIL) tablet 20 mg  20 mg Oral Daily Carrie Mew, MD   20 mg at 03/19/19 1031  . metFORMIN (GLUCOPHAGE) tablet 500 mg  500 mg Oral BID WC Carrie Mew, MD   500 mg at 03/19/19 1031  . multivitamin with minerals tablet 1 tablet  1 tablet Oral Daily Cristofano, Dorene Ar, MD   1 tablet at 03/19/19 1030  . prazosin (MINIPRESS) capsule 2 mg  2 mg Oral QHS Johnn Hai, MD   2 mg at 03/18/19 2105  . risperiDONE (RISPERDAL) tablet 3 mg  3 mg Oral BID Carrie Mew, MD   3 mg at 03/19/19 1031  . tamsulosin (FLOMAX) capsule 0.4 mg  0.4 mg Oral Daily Carrie Mew, MD   0.4 mg at 03/19/19 1031  . traZODone (DESYREL) tablet 150 mg  150 mg Oral QHS Carrie Mew, MD   150 mg at 03/18/19 2106   Current Outpatient Medications  Medication Sig Dispense Refill  . atorvastatin (LIPITOR) 10 MG  tablet Take 10 mg by mouth daily.    Marland Kitchen gabapentin (NEURONTIN) 300 MG capsule Take 300 mg by mouth 3 (three) times daily.    Marland Kitchen lisinopril (ZESTRIL) 20 MG tablet Take 1 tablet (20 mg total) by mouth daily. 30 tablet 0  . metFORMIN (GLUCOPHAGE) 500 MG tablet Take 1 tablet (500 mg total) by mouth 2 (two) times daily with a meal. 30 tablet 0  . paliperidone (INVEGA SUSTENNA) 234 MG/1.5ML SUSY injection Inject 234 mg into the muscle every 28 (twenty-eight) days. 1.8 mL 0  . risperiDONE (RISPERDAL) 3 MG tablet Take 1 tablet (3 mg total) by mouth 2 (two) times daily. 45 tablet 0  . tamsulosin (FLOMAX) 0.4 MG CAPS capsule Take 0.4 mg by mouth daily.    . traZODone (DESYREL) 150 MG tablet Take 150 mg by mouth at bedtime.        Discharge Medications: Please see discharge summary for a list of discharge medications.  Relevant Imaging Results:  Relevant Lab Results:   Additional Information SS# [id number]  Adelene Amas, LCSWA

## 2019-03-19 NOTE — ED Provider Notes (Signed)
-----------------------------------------   5:16 AM on 03/19/2019 -----------------------------------------   Blood pressure 113/67, pulse 98, temperature 98.2 F (36.8 C), temperature source Oral, resp. rate 16, height 1.803 m (5\' 11" ), weight 77.1 kg, SpO2 99 %.  The patient is calm and cooperative at this time.  There have been no acute events since the last update.  Awaiting disposition plan from Behavioral Medicine and/or Social Work team(s).   Hinda Kehr, MD 03/19/19 [phone]

## 2019-03-20 ENCOUNTER — Emergency Department: Payer: Medicaid Other

## 2019-03-20 LAB — URINALYSIS, COMPLETE (UACMP) WITH MICROSCOPIC
Bacteria, UA: NONE SEEN
Bilirubin Urine: NEGATIVE
Glucose, UA: NEGATIVE mg/dL
Ketones, ur: NEGATIVE mg/dL
Leukocytes,Ua: NEGATIVE
Nitrite: NEGATIVE
Protein, ur: NEGATIVE mg/dL
Specific Gravity, Urine: 1.014 (ref 1.005–1.030)
Squamous Epithelial / HPF: NONE SEEN (ref 0–5)
pH: 5 (ref 5.0–8.0)

## 2019-03-20 LAB — CBC WITH DIFFERENTIAL/PLATELET
Abs Immature Granulocytes: 0 10*3/uL (ref 0.00–0.07)
Basophils Absolute: 0 10*3/uL (ref 0.0–0.1)
Basophils Relative: 0 %
Eosinophils Absolute: 0 10*3/uL (ref 0.0–0.5)
Eosinophils Relative: 1 %
HCT: 28.8 % — ABNORMAL LOW (ref 39.0–52.0)
Hemoglobin: 10.1 g/dL — ABNORMAL LOW (ref 13.0–17.0)
Immature Granulocytes: 0 %
Lymphocytes Relative: 33 %
Lymphs Abs: 1 10*3/uL (ref 0.7–4.0)
MCH: 30.5 pg (ref 26.0–34.0)
MCHC: 35.1 g/dL (ref 30.0–36.0)
MCV: 87 fL (ref 80.0–100.0)
Monocytes Absolute: 0.3 10*3/uL (ref 0.1–1.0)
Monocytes Relative: 8 %
Neutro Abs: 1.8 10*3/uL (ref 1.7–7.7)
Neutrophils Relative %: 58 %
Platelets: 103 10*3/uL — ABNORMAL LOW (ref 150–400)
RBC: 3.31 MIL/uL — ABNORMAL LOW (ref 4.22–5.81)
RDW: 13.6 % (ref 11.5–15.5)
WBC: 3.2 10*3/uL — ABNORMAL LOW (ref 4.0–10.5)
nRBC: 0 % (ref 0.0–0.2)

## 2019-03-20 LAB — COMPREHENSIVE METABOLIC PANEL
ALT: 12 U/L (ref 0–44)
AST: 16 U/L (ref 15–41)
Albumin: 3.8 g/dL (ref 3.5–5.0)
Alkaline Phosphatase: 56 U/L (ref 38–126)
Anion gap: 7 (ref 5–15)
BUN: 50 mg/dL — ABNORMAL HIGH (ref 6–20)
CO2: 31 mmol/L (ref 22–32)
Calcium: 9 mg/dL (ref 8.9–10.3)
Chloride: 102 mmol/L (ref 98–111)
Creatinine, Ser: 2.35 mg/dL — ABNORMAL HIGH (ref 0.61–1.24)
GFR calc Af Amer: 34 mL/min — ABNORMAL LOW (ref >60)
GFR calc non Af Amer: 29 mL/min — ABNORMAL LOW (ref >60)
Glucose, Bld: 95 mg/dL (ref 70–99)
Potassium: 5 mmol/L (ref 3.5–5.1)
Sodium: 140 mmol/L (ref 135–145)
Total Bilirubin: 0.8 mg/dL (ref 0.3–1.2)
Total Protein: 6.6 g/dL (ref 6.5–8.1)

## 2019-03-20 LAB — BASIC METABOLIC PANEL
Anion gap: 10 (ref 5–15)
BUN: 46 mg/dL — ABNORMAL HIGH (ref 6–20)
CO2: 27 mmol/L (ref 22–32)
Calcium: 8.6 mg/dL — ABNORMAL LOW (ref 8.9–10.3)
Chloride: 103 mmol/L (ref 98–111)
Creatinine, Ser: 2.26 mg/dL — ABNORMAL HIGH (ref 0.61–1.24)
GFR calc Af Amer: 35 mL/min — ABNORMAL LOW (ref >60)
GFR calc non Af Amer: 30 mL/min — ABNORMAL LOW (ref >60)
Glucose, Bld: 109 mg/dL — ABNORMAL HIGH (ref 70–99)
Potassium: 4.7 mmol/L (ref 3.5–5.1)
Sodium: 140 mmol/L (ref 135–145)

## 2019-03-20 LAB — TROPONIN I (HIGH SENSITIVITY): Troponin I (High Sensitivity): 3 ng/L (ref <18)

## 2019-03-20 MED ORDER — SODIUM CHLORIDE 0.9 % IV SOLN: Freq: Once | INTRAVENOUS | Status: AC

## 2019-03-20 MED ORDER — SODIUM CHLORIDE 0.9 % IV BOLUS
1000.0000 mL | Freq: Once | INTRAVENOUS | Status: AC
  Administered 2019-03-20: 17:00:00 1000 mL via INTRAVENOUS

## 2019-03-20 NOTE — ED Notes (Signed)
Pt needed assistance sitting up in bed to take medications.

## 2019-03-20 NOTE — ED Notes (Signed)
Pt given meal tray and sprite. Pt more awake and alert now. NAD.

## 2019-03-20 NOTE — ED Notes (Signed)
Pt refused shower.  

## 2019-03-20 NOTE — ED Notes (Signed)
IVC moved to room 21 from [facility]

## 2019-03-20 NOTE — ED Provider Notes (Signed)
Patient's blood pressure has been lower, we are providing IV fluids and will reassess his creatinine.  EKG interpreted by me, sinus rhythm with a rate of 53 bpm, left axis deviation, nonspecific ST segment changes   Earleen Newport, MD 03/20/19 1336

## 2019-03-20 NOTE — ED Notes (Signed)
Gave patient water and drink. Pt refused [place] tray.AS

## 2019-03-20 NOTE — ED Notes (Signed)
Pt. Resting in bed.  Pt. States, "I am feeling better than I was".  Pt. Given warm blankets and drink.  Pt. Has no concerns or questions at this time.

## 2019-03-20 NOTE — ED Notes (Signed)
Pt given meal tray.

## 2019-03-20 NOTE — ED Notes (Signed)
Pt taken to CT with officer and Nicki Reaper, EDT.

## 2019-03-20 NOTE — ED Notes (Signed)
Pt moved to quad to get IV fluids.

## 2019-03-20 NOTE — ED Notes (Signed)
Pt given breakfast tray

## 2019-03-20 NOTE — ED Notes (Addendum)
EDP made aware of patient's low BP.   Labs drawn as ordered.   Pt has not been out of bed or eaten breakfast.

## 2019-03-21 LAB — BASIC METABOLIC PANEL
Anion gap: 8 (ref 5–15)
BUN: 37 mg/dL — ABNORMAL HIGH (ref 6–20)
CO2: 28 mmol/L (ref 22–32)
Calcium: 8.5 mg/dL — ABNORMAL LOW (ref 8.9–10.3)
Chloride: 105 mmol/L (ref 98–111)
Creatinine, Ser: 1.62 mg/dL — ABNORMAL HIGH (ref 0.61–1.24)
GFR calc Af Amer: 53 mL/min — ABNORMAL LOW (ref >60)
GFR calc non Af Amer: 45 mL/min — ABNORMAL LOW (ref >60)
Glucose, Bld: 91 mg/dL (ref 70–99)
Potassium: 4.7 mmol/L (ref 3.5–5.1)
Sodium: 141 mmol/L (ref 135–145)

## 2019-03-21 NOTE — ED Provider Notes (Signed)
-----------------------------------------   6:11 AM on 03/21/2019 -----------------------------------------   Blood pressure 120/72, pulse (!) 58, temperature 98 F (36.7 C), temperature source Oral, resp. rate 18, height 5\' 11"  (1.803 m), weight 77.1 kg, SpO2 98 %.  The patient is calm and cooperative at this time.  There have been no acute events since the last update.  Will repeat BMP today.  Awaiting disposition plan from Behavioral Medicine and/or Social Work team(s).    Merlyn Lot, MD 03/21/19 [phone]

## 2019-03-21 NOTE — ED Notes (Signed)
Pt gets up independently out of bed to use the restroom.

## 2019-03-21 NOTE — ED Notes (Signed)
Pt walking down hallway trying to leave. Pt directed back to room x3 times. See MAR for prn med. Pt given chocolate icecream.

## 2019-03-21 NOTE — ED Notes (Signed)
Patient given Sprite and encouraged to hydrate. 

## 2019-03-21 NOTE — ED Notes (Signed)
Hourly rounding reveals patient sleeping in room. No complaints, stable, in no acute distress. Q15 minute rounds and monitoring via Security Cameras to continue. 

## 2019-03-21 NOTE — ED Notes (Signed)
Pt assisted into chair to eat meal tray. Pt slow to respond to questions. VS stable. 2nd chair placed in patient's room for him to use as a table to eat on. Security made officer aware.

## 2019-03-21 NOTE — ED Provider Notes (Signed)
7:44 AM Assumed care for off going team.   Blood pressure 103/70, pulse 72, temperature 98 F (36.7 C), temperature source Oral, resp. rate 17, height 5\' 11"  (1.803 m), weight 77.1 kg, SpO2 97 %.  See their HPI for full report but in brief  IVC-had some low blood pressures yesterday and got some fluids and repeat BMP this morning.   Repeat BMP is improving down to 1.6.  This appears to be close to patient's baseline of 3 weeks ago 1.4.  Given this patient is medically cleared         Vanessa Fulton, MD 03/21/19 [phone]

## 2019-03-21 NOTE — ED Notes (Signed)
Pt given meal tray and a spoon

## 2019-03-21 NOTE — ED Notes (Signed)
Report to include Situation, Background, Assessment, and Recommendations received from Amy B. RN. Patient alert, warm and dry, in no acute distress. Patient denies SI, HI, AVH and pain. Patient made aware of Q15 minute rounds and security cameras for their safety. Patient instructed to come to me with needs or concerns. 

## 2019-03-21 NOTE — ED Notes (Signed)
Pt helped into recliner and bedside table used to eat. Pt eating breakfast at this time unassisted.

## 2019-03-21 NOTE — ED Notes (Signed)
Hourly rounding reveals patient awake in room. No complaints, stable, in no acute distress. Q15 minute rounds and monitoring via Security Cameras to continue. 

## 2019-03-21 NOTE — ED Notes (Signed)
Pt did not eat any of his dinner. Pt did drink a cup of water.

## 2019-03-21 NOTE — ED Notes (Signed)
Pt urinated on himself and on the floor while getting up to go to the restroom. Pt given new underwear and pants and socks. Pt able to stand and assist in this.

## 2019-03-22 NOTE — ED Notes (Signed)
Fluids and snack offered.

## 2019-03-22 NOTE — ED Notes (Signed)
Hourly rounding reveals patient awake in room. No complaints, stable, in no acute distress. Q15 minute rounds and monitoring via Security Cameras to continue. 

## 2019-03-22 NOTE — ED Notes (Signed)
Hourly rounding reveals patient sleeping in room. No complaints, stable, in no acute distress. Q15 minute rounds and monitoring via Security Cameras to continue. 

## 2019-03-22 NOTE — Progress Notes (Signed)
Physical Therapy Treatment Patient Details Name: Peter Parsons MRN: [id number] DOB: [birth year] Today's Date: 03/22/2019    History of Present Illness Pt is a 59 y.o. male presenting to hospital 02/11/20 with IVC from group home d/t wandering off into woods multiple times (found near water); pt reporting suicidal thoughts of drowning himself.  Pt tripped on his own feet and hit chin on chair this morning 2/4 (pt got back up and walked to restroom independently).  Unable to return to group home d/t safety concerns and per notes seeking memory care unit.  Per chart review pt scored 19/30 on [facility] cognitive assessment with psychiatry indicative of moderate cognitive impairment (consistent with pt's diagnosis of dementia); also psychiatry discussing placement into locked facility necessary to maintain safety for wandering behaviors.  PMH includes DM, htn, schizophrenia.    PT Comments    Pt resting in bed upon PT arrival; pt pleasant and cooperative during session although pt verbalizing concerns regarding what could happen today or in the future (nurse already aware).  Pt demonstrating with fluctuating almost partial step through gait pattern to mild shuffling gait with shorter step length; intermittent vc's required to take longer steps and for B heel-strike.  Pt tolerated higher level balance activities and UE/LE strengthening activities well during session.  Will continue to focus on strengthening, higher level of balance, and functional mobility during hospitalization.   Follow Up Recommendations  Outpatient PT     Equipment Recommendations  None recommended by PT    Recommendations for Other Services       Precautions / Restrictions Precautions Precautions: Fall Precaution Comments: Suicide precautions; IVC Restrictions Weight Bearing Restrictions: No    Mobility  Bed Mobility Overal bed mobility: Modified Independent             General bed mobility comments: Semi-supine to  sitting edge of bed without any noted difficulty  Transfers Overall transfer level: Independent Equipment used: None             General transfer comment: fairly strong stand; steady  Ambulation/Gait Ambulation/Gait assistance: Min guard Gait Distance (Feet): (200 feet x2) Assistive device: None   Gait velocity: decreased   General Gait Details: pt with fluctuating almost partial step through gait pattern to mild shuffling gait with shorter step length; intermittent vc's required to take longer steps and for B heelstrike   Stairs             Wheelchair Mobility    Modified Rankin (Stroke Patients Only)       Balance Overall balance assessment: Needs assistance Sitting-balance support: No upper extremity supported;Feet supported Sitting balance-Leahy Scale: Normal Sitting balance - Comments: steady sitting reaching outside BOS   Standing balance support: No upper extremity supported Standing balance-Leahy Scale: Good Standing balance comment: steady standing reaching just outside BOS  Standing side-stepping L/R x10 feet (x2 reps B); CGA provided for safety Standing walking backwards 10 feet x2; CGA provided for safety; occasional vc's to slow down required                          Cognition Arousal/Alertness: Awake/alert Behavior During Therapy: Flat affect Overall Cognitive Status: Within Functional Limits for tasks assessed                                 General Comments: Alert and oriented to person and place and situation  Exercises General Exercises - Lower Extremity Long Arc Quad: AROM;Strengthening;Both;10 reps;Seated Hip Flexion/Marching: AROM;Strengthening;Both;10 reps;Seated Shoulder Exercises Shoulder Flexion: AROM;Strengthening;Both;10 reps;Seated(AROM grossly 90-100 degrees) Shoulder ABduction: AROM;Strengthening;Both;10 reps;Seated(AROM to grossly 80-90 degrees) Elbow Flexion: AROM;Strengthening;Both;10  reps;Seated Elbow Extension: AROM;Strengthening;Both;10 reps;Seated Other Exercises Other Exercises: Sitting scapular squeezes AROM 10 reps B    General Comments   Nursing cleared pt for participation in physical therapy.  Pt agreeable to PT session.      Pertinent Vitals/Pain Pain Assessment: No/denies pain  HR 110-113 bpm during activities; O2 sats WFL on room air    Home Living                      Prior Function            PT Goals (current goals can now be found in the care plan section) Acute Rehab PT Goals Patient Stated Goal: to improve balance and walking PT Goal Formulation: With patient Time For Goal Achievement: 04/01/19 Potential to Achieve Goals: Fair Progress towards PT goals: Progressing toward goals    Frequency    Min 2X/week      PT Plan Current plan remains appropriate    Co-evaluation              AM-PAC PT "6 Clicks" Mobility   Outcome Measure  Help needed turning from your back to your side while in a flat bed without using bedrails?: None Help needed moving from lying on your back to sitting on the side of a flat bed without using bedrails?: None Help needed moving to and from a bed to a chair (including a wheelchair)?: None Help needed standing up from a chair using your arms (e.g., wheelchair or bedside chair)?: A Little Help needed to walk in hospital room?: A Little Help needed climbing 3-5 steps with a railing? : A Little 6 Click Score: 21    End of Session Equipment Utilized During Treatment: Gait belt Activity Tolerance: Patient tolerated treatment well Patient left: (sitting on edge of bed per pt request) Nurse Communication: Mobility status;Precautions PT Visit Diagnosis: Other abnormalities of gait and mobility (R26.89);Muscle weakness (generalized) (M62.81);History of falling (Z91.81)     Time: [id number] PT Time Calculation (min) (ACUTE ONLY): 24 min  Charges:  $Gait Training: 8-22 mins $Therapeutic  Exercise: 8-22 mins                    Leitha Bleak, PT 03/22/19, 12:49 PM

## 2019-03-22 NOTE — ED Notes (Signed)
Physical therapy is with Patient and He is ambulating with her with waist belt around him, He is cooperative, and calm, no signs of distress.

## 2019-03-22 NOTE — ED Notes (Signed)
Patient came up to nursing station and told nurse that He felt anxious and violent, nurse administered His prn dose of Klonopin. Patient states " I hope this is strong enough to help me" Nurse will continue to monitor, he has no behavioral issues at this time.

## 2019-03-22 NOTE — ED Notes (Signed)
Patient is eating lunch, no signs of distress at this time, will continue to monitor.

## 2019-03-22 NOTE — ED Provider Notes (Signed)
-----------------------------------------   3:42 AM on 03/22/2019 -----------------------------------------   Blood pressure 131/85, pulse 98, temperature 98 F (36.7 C), temperature source Oral, resp. rate 17, height 1.803 m (5\' 11" ), weight 77.1 kg, SpO2 99 %.  The patient is calm and cooperative at this time.  There have been no acute events since the last update.  Awaiting disposition plan from Behavioral Medicine and/or Social Work team(s).   Hinda Kehr, MD 03/22/19 [phone]

## 2019-03-22 NOTE — ED Notes (Signed)
Hourly rounding reveals patient sleepig in room. No complaints, stable, in no acute distress. Q15 minute rounds and monitoring via [employer] to continue.

## 2019-03-22 NOTE — ED Notes (Signed)
Patient took all morning medications, He ate about 25% of breakfast, but states He wants to keep His tray for late, He denies discomfort, but states He does feel Si, no Hi or avh, also states that He had nightmares last night and now He knows He is going to die today" Nurse reassured him that He was safe, Nurse will continue to monitor.

## 2019-03-22 NOTE — Care Management (Addendum)
TOC RN CM: Collaborated with Lars Masson, ACTT supervisor who is also assisting with placement. Suggestion made for possible placement at [facility] or [facility]. Patient with mention of SI today. Staff with concerns of exacerbation of mental health due to isolation in current placement. Supervisor concerned about history of kidney stones and recent incontinent episodes.   TOC RN CM: Notified by [facility] previous fax not received. Given alternate fax number to [phone]. Refaxed Fl2 and notes to [employer]

## 2019-03-22 NOTE — ED Notes (Signed)
Report to include Situation, Background, Assessment, and Recommendations received from Wendy RN. Patient alert, warm and dry, in no acute distress. Patient denies SI, HI, AVH and pain. Patient made aware of Q15 minute rounds and security cameras for their safety. Patient instructed to come to me with needs or concerns. 

## 2019-03-23 MED ORDER — METFORMIN HCL 500 MG PO TABS
500.0000 mg | ORAL_TABLET | Freq: Two times a day (BID) | ORAL | Status: DC
  Administered 2019-03-24 – 2019-03-27 (×8): 500 mg via ORAL
  Filled 2019-03-23 (×9): qty 1

## 2019-03-23 NOTE — ED Notes (Signed)
Hourly rounding reveals patient sleeping in room. No complaints, stable, in no acute distress. Q15 minute rounds and monitoring via Security Cameras to continue. 

## 2019-03-23 NOTE — ED Notes (Signed)
Report to include Situation, Background, Assessment, and Recommendations received from [place]. Patient alert, warm and dry, in no acute distress. Patient denies HI, AVH and pain. Patient states he still has SI without plan. Patient made aware of Q15 minute rounds and security cameras for their safety. Patient instructed to come to me with needs or concerns.

## 2019-03-23 NOTE — ED Notes (Signed)
Hourly rounding reveals patient awake in room. No complaints, stable, in no acute distress. Q15 minute rounds and monitoring via Security Cameras to continue. 

## 2019-03-23 NOTE — ED Notes (Signed)
IVC/ Legal paperwork to be renewed on 03/24/19/ Still pending placement

## 2019-03-23 NOTE — ED Provider Notes (Signed)
-----------------------------------------   7:59 AM on 03/23/2019 -----------------------------------------  Blood pressure 123/75, pulse 99, temperature 98.9 F (37.2 C), temperature source Oral, resp. rate 16, height 5\' 11"  (1.803 m), weight 77.1 kg, SpO2 99 %.  The patient is calm and cooperative at this time.  There have been no acute events since the last update.  Awaiting disposition plan from Behavioral Medicine team.   Blake Divine, MD 03/23/19 [phone]

## 2019-03-24 NOTE — ED Notes (Signed)
Hourly rounding reveals patient sleeping in room. No complaints, stable, in no acute distress. Q15 minute rounds and monitoring via Security Cameras to continue. 

## 2019-03-24 NOTE — ED Notes (Signed)
When RN was attempting to scan medications to administer to another pt, this pt (Mr. Demers) came out of his room walking toward nurse and door/window at the nurses station. When walking toward RN pt was asked 3 times if he needed something. Each time pt responded "no, I don't need anything". Pt proceeded to walk past RN like he was going to the door/window to ask for something at the nurses station. When the patient made in to the back side of this nurse, pt quickly ran around RN and grabbed RN left side pocket. While reaching/grabbing RN left side pt stated " I need this". Pt was attempting to grab foldable trauma sheers from RN left side pocket. When pt attempted to grab RN side, RN turned side away from pt and placed both hand over pt hands preventing pt from obtaining sheers. Security Guard also came into common area to ensure safety as soon as pt started moving quickly toward RN. After sheers were secured at the nursing station RN went back to speak to patient to ask why he had grabbed at the trauma sheers. Pt states " I was going to kill myself, Im already dead, my body already dead, I just haven't claimed it yet". Several minutes after situation pt was calm, and cooperative when RN was administering medications to this patient.

## 2019-03-24 NOTE — ED Notes (Signed)
Pt given meal tray.

## 2019-03-24 NOTE — ED Provider Notes (Signed)
-----------------------------------------   4:44 AM on 03/24/2019 -----------------------------------------   Blood pressure 112/73, pulse 85, temperature 97.9 F (36.6 C), temperature source Oral, resp. rate 18, height 5\' 11"  (1.803 m), weight 77.1 kg, SpO2 99 %.  The patient had no acute events since last update.  Calm and cooperative at this time.  Disposition is pending per Psychiatry/Behavioral Medicine team recommendations.     Rudene Re, MD 03/24/19 [phone]

## 2019-03-24 NOTE — Progress Notes (Signed)
PT Cancellation Note  Patient Details Name: Peter Parsons MRN: [id number] DOB: 14-Sep-1960   Cancelled Treatment:    Reason Eval/Treat Not Completed: Other (comment).  Pt sleeping in bed upon PT arrival.  Pt woken with vc's but pt appearing very tired.  Pt reporting being tired and when therapist asked pt if he had slept well last night, pt stated "no" and fell back asleep.  Therapist attempted to wake pt a couple more times but pt would open his eyes and then close them again and fall back asleep.  Nurse notified.  Will re-attempt PT treatment session at a later date/time.  Leitha Bleak, PT 03/24/19, 3:32 PM

## 2019-03-25 NOTE — ED Notes (Signed)
IVC/ Still pending placement   

## 2019-03-25 NOTE — ED Notes (Signed)
Patient went to restroom 

## 2019-03-25 NOTE — ED Notes (Signed)
Pt given meal tray.

## 2019-03-25 NOTE — Social Work (Signed)
TOC SW:  Spoke with Ian Malkin TTS Counselor about patient status. Spoke about different options for placement for patient.  TOC SW/RN will continue looking for placement.

## 2019-03-25 NOTE — ED Notes (Signed)
Pt given meal tray but only ate 25% of tray. After which he threw meal tray away.

## 2019-03-25 NOTE — ED Notes (Signed)
Pt received call from [employer].

## 2019-03-25 NOTE — ED Notes (Signed)
Pt given breakfast tray

## 2019-03-25 NOTE — ED Notes (Signed)
Pt ate 95% of his lunch.

## 2019-03-25 NOTE — ED Provider Notes (Signed)
-----------------------------------------   10:00 AM on 03/25/2019 -----------------------------------------   Blood pressure 109/71, pulse 72, temperature 98.6 F (37 C), temperature source Oral, resp. rate 16, height 5\' 11"  (1.803 m), weight 77.1 kg, SpO2 100 %.  The patient is calm and cooperative at this time.  There have been no acute events since the last update.  Awaiting disposition plan from Behavioral Medicine and/or Social Work team(s).    Merlyn Lot, MD 03/25/19 [phone]

## 2019-03-25 NOTE — ED Notes (Signed)
Assisted pt with shower while he sat in chair for saftey

## 2019-03-26 NOTE — ED Notes (Signed)
ED BHU  Is the patient under IVC or is there intent for IVC: Yes.   Is the patient medically cleared: Yes.   Is there vacancy in the ED BHU: Yes.   Is the population mix appropriate for patient: Yes.   Is the patient awaiting placement in inpatient or outpatient setting: Yes.  Social work is looking for him a safe place to live Has the patient had a psychiatric consult: he has been cleared by psychiatry  Survey of unit performed for contraband, proper placement and condition of furniture, tampering with fixtures in bathroom, shower, and each patient room: Yes.  ; Findings:  APPEARANCE/BEHAVIOR Calm and cooperative NEURO ASSESSMENT Orientation: oriented to self, place and situation  Reoriented to time  Denies pain Hallucinations: No.None noted  denies (Hallucinations) Speech: Normal Gait: normal RESPIRATORY ASSESSMENT Even  Unlabored respirations  CARDIOVASCULAR ASSESSMENT Pulses equal   regular rate  Skin warm and dry   GASTROINTESTINAL ASSESSMENT no GI complaint EXTREMITIES Full ROM  PLAN OF CARE Provide calm/safe environment. Vital signs assessed twice daily. ED BHU Assessment once each 12-hour shift. No TTS available   Assure the ED provider has rounded once each shift. Provide and encourage hygiene. Provide redirection as needed. Assess for escalating behavior; address immediately and inform ED provider.  Assess family dynamic and appropriateness for visitation as needed: Yes.  ; If necessary, describe findings:  Educate the patient/family about BHU procedures/visitation: Yes.  ; If necessary, describe findings:

## 2019-03-26 NOTE — ED Notes (Signed)
IVC/  PENDING  PLACEMENT 

## 2019-03-26 NOTE — ED Provider Notes (Signed)
-----------------------------------------   6:37 AM on 03/26/2019 -----------------------------------------   Blood pressure 115/70, pulse 94, temperature 99.1 F (37.3 C), resp. rate 16, height 1.803 m (5\' 11" ), weight 77.1 kg, SpO2 99 %.  The patient is calm and cooperative at this time.  There have been no acute events since the last update.  Awaiting disposition plan from Behavioral Medicine and/or Social Work team(s).   Hinda Kehr, MD 03/26/19 [phone]

## 2019-03-26 NOTE — Progress Notes (Signed)
PT Cancellation Note  Patient Details Name: Peter Parsons MRN: [id number] DOB: [birth year]   Cancelled Treatment:    Reason Eval/Treat Not Completed: Other (comment).  Upon PT arrival to pt's room, pt noted to be sleeping in bed.  Attempted to wake pt multiple times but pt soundly sleeping.  Nurse notified.  Will re-attempt PT treatment session at a later date/time.  Leitha Bleak, PT 03/26/19, 11:48 AM

## 2019-03-26 NOTE — ED Notes (Signed)
Gave patient [place] tray and drink and water, encourage patient to drink water.AS

## 2019-03-27 ENCOUNTER — Emergency Department: Payer: Medicaid Other

## 2019-03-27 DIAGNOSIS — E1121 Type 2 diabetes mellitus with diabetic nephropathy: Secondary | ICD-10-CM

## 2019-03-27 DIAGNOSIS — G459 Transient cerebral ischemic attack, unspecified: Secondary | ICD-10-CM

## 2019-03-27 DIAGNOSIS — R531 Weakness: Secondary | ICD-10-CM

## 2019-03-27 DIAGNOSIS — R1310 Dysphagia, unspecified: Secondary | ICD-10-CM | POA: Diagnosis not present

## 2019-03-27 DIAGNOSIS — R29810 Facial weakness: Secondary | ICD-10-CM | POA: Diagnosis not present

## 2019-03-27 DIAGNOSIS — N1831 Chronic kidney disease, stage 3a: Secondary | ICD-10-CM

## 2019-03-27 LAB — COMPREHENSIVE METABOLIC PANEL
ALT: 13 U/L (ref 0–44)
AST: 21 U/L (ref 15–41)
Albumin: 4.4 g/dL (ref 3.5–5.0)
Alkaline Phosphatase: 59 U/L (ref 38–126)
Anion gap: 11 (ref 5–15)
BUN: 30 mg/dL — ABNORMAL HIGH (ref 6–20)
CO2: 29 mmol/L (ref 22–32)
Calcium: 9.4 mg/dL (ref 8.9–10.3)
Chloride: 102 mmol/L (ref 98–111)
Creatinine, Ser: 1.86 mg/dL — ABNORMAL HIGH (ref 0.61–1.24)
GFR calc Af Amer: 45 mL/min — ABNORMAL LOW (ref >60)
GFR calc non Af Amer: 38 mL/min — ABNORMAL LOW (ref >60)
Glucose, Bld: 93 mg/dL (ref 70–99)
Potassium: 4.5 mmol/L (ref 3.5–5.1)
Sodium: 142 mmol/L (ref 135–145)
Total Bilirubin: 0.8 mg/dL (ref 0.3–1.2)
Total Protein: 7.5 g/dL (ref 6.5–8.1)

## 2019-03-27 LAB — CBC WITH DIFFERENTIAL/PLATELET
Abs Immature Granulocytes: 0.02 10*3/uL (ref 0.00–0.07)
Basophils Absolute: 0 10*3/uL (ref 0.0–0.1)
Basophils Relative: 0 %
Eosinophils Absolute: 0 10*3/uL (ref 0.0–0.5)
Eosinophils Relative: 1 %
HCT: 31.9 % — ABNORMAL LOW (ref 39.0–52.0)
Hemoglobin: 11.3 g/dL — ABNORMAL LOW (ref 13.0–17.0)
Immature Granulocytes: 0 %
Lymphocytes Relative: 29 %
Lymphs Abs: 1.6 10*3/uL (ref 0.7–4.0)
MCH: 29.7 pg (ref 26.0–34.0)
MCHC: 35.4 g/dL (ref 30.0–36.0)
MCV: 83.9 fL (ref 80.0–100.0)
Monocytes Absolute: 0.3 10*3/uL (ref 0.1–1.0)
Monocytes Relative: 6 %
Neutro Abs: 3.5 10*3/uL (ref 1.7–7.7)
Neutrophils Relative %: 64 %
Platelets: 147 10*3/uL — ABNORMAL LOW (ref 150–400)
RBC: 3.8 MIL/uL — ABNORMAL LOW (ref 4.22–5.81)
RDW: 13.2 % (ref 11.5–15.5)
WBC: 5.4 10*3/uL (ref 4.0–10.5)
nRBC: 0 % (ref 0.0–0.2)

## 2019-03-27 LAB — TROPONIN I (HIGH SENSITIVITY): Troponin I (High Sensitivity): 9 ng/L (ref <18)

## 2019-03-27 MED ORDER — SODIUM CHLORIDE 0.9 % IV BOLUS
1000.0000 mL | Freq: Once | INTRAVENOUS | Status: AC
  Administered 2019-03-27: 1000 mL via INTRAVENOUS

## 2019-03-27 NOTE — ED Notes (Signed)
Pt. Currently sleeping on bed. 

## 2019-03-27 NOTE — ED Notes (Signed)
Pt. Appears lethargic and not answering questions.  Pt. Had uneaten food in mouth.  Pt. Unable to hold himself in a sitting position without support.  MD and charge nurse informed.  Orders issued to move patient back to ED Main #26.  Security called to move patient.

## 2019-03-27 NOTE — ED Provider Notes (Signed)
-----------------------------------------   3:58 AM on 03/27/2019 -----------------------------------------   Blood pressure 106/76, pulse (!) 109, temperature 99.1 F (37.3 C), temperature source Oral, resp. rate 17, height 5\' 11"  (1.803 m), weight 77.1 kg, SpO2 98 %.  The patient is calm and cooperative at this time.  There have been no acute events since the last update.  Awaiting disposition plan from Behavioral Medicine and/or Social Work team(s).    Harvest Dark, MD 03/27/19 [phone]

## 2019-03-27 NOTE — ED Notes (Signed)
ivc/pending placement.. 

## 2019-03-27 NOTE — ED Notes (Signed)
Pt given lunch tray.

## 2019-03-27 NOTE — ED Notes (Signed)
Gave pt food tray with juice. 

## 2019-03-27 NOTE — ED Provider Notes (Signed)
-----------------------------------------   9:47 PM on 03/27/2019 -----------------------------------------  This patient has been in the ED for the last 44 days.  I was informed by the RN in the [facility] that the patient appeared weaker than prior and appeared to have poor tone, difficulty holding himself upright, and trouble swallowing.  Therefore, I agreed with returning the patient to the main ED for repeat evaluation.  Apparently about a week ago he became weaker and dehydrated, and was found to have AKI.  He was hydrated, recovered, and then went back to the [facility].  On exam, the patient is quite weak appearing.  His vital signs are normal except for tachycardia.  He is alert and able to answer questions.  He reports difficulty swallowing but denies any acute pain or other acute complaints.  One of the RNs who is quite familiar with the patient reports that he may have a new right facial droop.  He does appear to have some flattening of the nasolabial fold and asymmetric smile on the right, although it is difficult to confirm his baseline.  He does not have significant asymmetric weakness in the extremities.  He has somewhat dry mucous membranes.  The remainder of the exam is unremarkable.  At this time, differential includes dehydration, AKI, other metabolic disturbance, infection, aspiration, or possible stroke.  We will obtain a CT head, chest x-ray, repeat lab work-up (most recent labs were about a week ago) and reassess.   ED ECG REPORT I, Arta Silence, the attending physician, personally viewed and interpreted this ECG.  Date: 03/27/2019 EKG Time: 2159 Rate: 111 Rhythm: Sinus tachycardia QRS Axis: normal Intervals: LAFB ST/T Wave abnormalities: No significant abnormalities, but interpretation limited by poor baseline in lead V3 Narrative Interpretation: no evidence of acute ischemia   ----------------------------------------- 11:51 PM on  03/27/2019 -----------------------------------------  Lab work-up reveals worsening creatinine but no other acute findings.  CT shows no obvious findings of a recent stroke.  However given the patient's dysphagia, facial droop, and increased weakness, he will need admission for stroke work-up.  I discussed his case with Dr. Sidney Ace from the hospitalist service.   Arta Silence, MD 03/27/19 2352

## 2019-03-27 NOTE — ED Notes (Signed)
Pt in ct - was brought over for evaluation from behavioural health with lethargy and drooling. Pt states he is having trouble swallowing. nihs scale 2.

## 2019-03-28 ENCOUNTER — Observation Stay: Payer: Medicaid Other

## 2019-03-28 ENCOUNTER — Observation Stay
Admit: 2019-03-28 | Discharge: 2019-03-28 | Disposition: A | Discharge status: Home / Self Care | Payer: Medicaid Other | Attending: Family Medicine | Admitting: Family Medicine

## 2019-03-28 ENCOUNTER — Encounter: Payer: Self-pay | Admitting: Family Medicine

## 2019-03-28 DIAGNOSIS — F200 Paranoid schizophrenia: Secondary | ICD-10-CM | POA: Diagnosis not present

## 2019-03-28 LAB — LIPID PANEL
Cholesterol: 57 mg/dL (ref 0–200)
HDL: 21 mg/dL — ABNORMAL LOW (ref >40)
LDL Cholesterol: 21 mg/dL (ref 0–99)
Total CHOL/HDL Ratio: 2.7 RATIO
Triglycerides: 77 mg/dL (ref <150)
VLDL: 15 mg/dL (ref 0–40)

## 2019-03-28 LAB — URINALYSIS, COMPLETE (UACMP) WITH MICROSCOPIC
Bilirubin Urine: NEGATIVE
Glucose, UA: NEGATIVE mg/dL
Ketones, ur: 5 mg/dL — AB
Leukocytes,Ua: NEGATIVE
Nitrite: NEGATIVE
Protein, ur: NEGATIVE mg/dL
Specific Gravity, Urine: 1.015 (ref 1.005–1.030)
Squamous Epithelial / HPF: NONE SEEN (ref 0–5)
pH: 5 (ref 5.0–8.0)

## 2019-03-28 LAB — GLUCOSE, CAPILLARY
Glucose-Capillary: 77 mg/dL (ref 70–99)
Glucose-Capillary: 79 mg/dL (ref 70–99)
Glucose-Capillary: 91 mg/dL (ref 70–99)
Glucose-Capillary: 69 mg/dL — ABNORMAL LOW (ref 70–99)
Glucose-Capillary: 71 mg/dL (ref 70–99)
Glucose-Capillary: 82 mg/dL (ref 70–99)

## 2019-03-28 LAB — ECHOCARDIOGRAM COMPLETE
Height: 69 in
Weight: 2497.37 oz

## 2019-03-28 LAB — HEMOGLOBIN A1C
Hgb A1c MFr Bld: 4.5 % — ABNORMAL LOW (ref 4.8–5.6)
Mean Plasma Glucose: 82.45 mg/dL

## 2019-03-28 LAB — TROPONIN I (HIGH SENSITIVITY): Troponin I (High Sensitivity): 6 ng/L

## 2019-03-28 MED ORDER — ENOXAPARIN SODIUM 40 MG/0.4ML ~~LOC~~ SOLN
40.0000 mg | SUBCUTANEOUS | Status: DC
  Administered 2019-03-28 – 2019-05-14 (×48): 40 mg via SUBCUTANEOUS
  Filled 2019-03-28 (×47): qty 0.4

## 2019-03-28 MED ORDER — ENOXAPARIN SODIUM 40 MG/0.4ML ~~LOC~~ SOLN: 40.0000 mg | SUBCUTANEOUS | Status: DC

## 2019-03-28 MED ORDER — INSULIN ASPART 100 UNIT/ML ~~LOC~~ SOLN
0.0000 [IU] | Freq: Three times a day (TID) | SUBCUTANEOUS | Status: DC
  Administered 2019-03-29: 1 [IU] via SUBCUTANEOUS
  Administered 2019-03-29: 3 [IU] via SUBCUTANEOUS
  Administered 2019-03-29 – 2019-04-04 (×9): 1 [IU] via SUBCUTANEOUS
  Filled 2019-03-28 (×11): qty 1

## 2019-03-28 MED ORDER — SODIUM CHLORIDE 0.9 % IV SOLN: INTRAVENOUS | Status: DC

## 2019-03-28 MED ORDER — STROKE: EARLY STAGES OF RECOVERY BOOK: Freq: Once | Status: AC

## 2019-03-28 MED ORDER — CHLORHEXIDINE GLUCONATE 0.12 % MT SOLN
15.0000 mL | Freq: Two times a day (BID) | OROMUCOSAL | Status: DC
  Administered 2019-03-28 – 2019-07-11 (×173): 15 mL via OROMUCOSAL
  Filled 2019-03-28 (×180): qty 15

## 2019-03-28 MED ORDER — SENNOSIDES-DOCUSATE SODIUM 8.6-50 MG PO TABS
1.0000 | ORAL_TABLET | Freq: Every evening | ORAL | Status: DC | PRN
  Administered 2019-04-15: 1 via ORAL
  Filled 2019-03-28 (×2): qty 1

## 2019-03-28 MED ORDER — DEXTROSE 50 % IV SOLN
1.0000 | INTRAVENOUS | Status: DC | PRN
  Filled 2019-03-28: qty 50

## 2019-03-28 MED ORDER — DEXTROSE 5 % IV SOLN: INTRAVENOUS | Status: DC

## 2019-03-28 NOTE — Progress Notes (Signed)
[facility] Progress Note  Patient: Peter Parsons    [id number]  DOA: 02/11/2019     Date of Service: the patient was seen and examined on 03/28/2019  Chief Complaint  Patient presents with   ivc   Brief hospital course: Peter Parsons  is a 59 y.o. Caucasian male with a known history of type 2 diabetes mellitus, hypertension and schizophrenia who has been in the [facility] for the last 44 days, presented with acute onset of generalized weakness she has been getting worse to the point he was not able to hold himself upright and was having dysphagia.  He was also noted to have left facial droop.  The patient admits to slurred speech as well as expressive dysphasia that started earlier yesterday.  He has been feeling generally weak.  He denied any paresthesias or focal muscle weakness.  No tinnitus or vertigo.  He admits to loose bowel movements with stool incontinence.  No dysuria, oliguria or hematuria or flank pain.  Not any headache or dizziness.  No chest pain or dyspnea or palpitations cough or wheezing or hemoptysis.  No COVID-19 exposure  Currently further plan is consult psychiatry and hydrate.  Assessment and Plan: 1.  Generalized weakness, with dysphagia and dysarthria, left facial droop, rule out acute CVA with differential diagnosis including TIA.  MRI brain negative for any acute stroke. No further work-up at present. Patient actually remains confused and has diffuse aphasia therefore speech therapy evaluation requested. We will also request psychiatric evaluation.  2.  Type 2 diabetes mellitus with stage IIIa chronic kidney disease.  The patient will be placed on supplement coverage with NovoLog and will hold metformin for now.  3.  Hypertension.  We will continue prazosin.  4.  BPH.  We will continue Flomax.  5.  Peripheral neuropathy.  We will continue Neurontin.  6.  Depression with anxiety, bipolar disorder and schizophrenia.  We will continue Lexapro, Risperdal and  Klonopin.  7.  Dyslipidemia.  We will continue statin therapy.  Diet: N.p.o. until speech therapy evaluates the patient The applesauce DVT Prophylaxis: Subcutaneous Heparin    Advance goals of care discussion: Full code  Family Communication: no family was present at bedside, at the time of interview.   Disposition:  Pt is from home, admitted with confusion, still has confusion, which precludes a safe discharge. Discharge to be determined  Subjective: Denies any acute complaint of nausea and vomiting.  Remains confused.  Physical Exam: General:  alert not oriented to time, place, and person.  Appear in moderate distress, affect flat in affect Eyes: PERRL ENT: Oral Mucosa Clear, moist  Neck: no JVD,  Cardiovascular: S1 and S2 Present, no Murmur,  Respiratory: good respiratory effort, Bilateral Air entry equal and Decreased, no Crackles, no wheezes Abdomen: Bowel Sound present, Soft and no tenderness,  Skin: no rash  Extremities: no Pedal edema, no calf tenderness Neurologic: without any new focal findings  Gait not checked due to patient safety concerns  Vitals:   03/28/19 0535 03/28/19 0755 03/28/19 1019 03/28/19 1223  BP: 134/81 114/68 114/65 109/66  Pulse: 71 66 76 (!) 53  Resp: 16 12 16 17   Temp: 98.8 F (37.1 C) 98.6 F (37 C) 97.9 F (36.6 C) 97.8 F (36.6 C)  TempSrc: Oral Oral Oral Oral  SpO2: 99% 98% 99% 98%  Weight:      Height:        Intake/Output Summary (Last 24 hours) at 03/28/2019 1910 Last data filed at 03/28/2019  1857 Gross per 24 hour  Intake 2015.61 ml  Output 51 ml  Net 1964.61 ml   Filed Weights   02/11/19 1441 03/28/19 0306  Weight: 77.1 kg 70.8 kg    Data Reviewed: I have personally reviewed and interpreted daily labs, tele strips, imagings as discussed above. I reviewed all nursing notes, pharmacy notes, vitals, pertinent old records I have discussed plan of care as described above with RN and patient/family.  CBC: Recent Labs    Lab 03/27/19 2152  WBC 5.4  NEUTROABS 3.5  HGB 11.3*  HCT 31.9*  MCV 83.9  PLT [id number]*   Basic Metabolic Panel: Recent Labs  Lab 03/27/19 2152  NA 142  K 4.5  CL 102  CO2 29  GLUCOSE 93  BUN 30*  CREATININE 1.86*  CALCIUM 9.4    Studies: CT Head Wo Contrast  Result Date: 03/27/2019 CLINICAL DATA:  Focal neuro deficit for greater than 6 hours, lethargic, not answering questions EXAM: CT HEAD WITHOUT CONTRAST TECHNIQUE: Contiguous axial images were obtained from the base of the skull through the vertex without intravenous contrast. COMPARISON:  CT head 02/11/2019 FINDINGS: Brain: No evidence of acute infarction, hemorrhage, hydrocephalus, extra-axial collection or mass lesion/mass effect. Vascular: Atherosclerotic calcification of the carotid siphons and intradural vertebral arteries. No hyperdense vessel. Skull: No calvarial fracture or suspicious osseous lesion. Mild right temporal scalp swelling without large hematoma. Sinuses/Orbits: Paranasal sinuses and mastoid air cells are predominantly clear. Included orbital structures are unremarkable. Other: None IMPRESSION: No acute intracranial findings. Mild right temporal scalp swelling, nonspecific. Electronically Signed   By: Lovena Le M.D.   On: 03/27/2019 22:25   MR BRAIN WO CONTRAST  Result Date: 03/28/2019 CLINICAL DATA:  Diabetes and hypertension. Generalized weakness. Lethargy. EXAM: MRI HEAD WITHOUT CONTRAST TECHNIQUE: Multiplanar, multiecho pulse sequences of the brain and surrounding structures were obtained without intravenous contrast. COMPARISON:  CT 03/27/2019 FINDINGS: Brain: The brain has a normal appearance without evidence of malformation, atrophy, old or acute small or large vessel infarction, mass lesion, hemorrhage, hydrocephalus or extra-axial collection. Vascular: Major vessels at the base of the brain show flow. Venous sinuses appear patent. Skull and upper cervical spine: Normal. Sinuses/Orbits: Clear/normal.  Other: None significant. IMPRESSION: Normal examination. Electronically Signed   By: Nelson Chimes M.D.   On: 03/28/2019 01:42   US Carotid Bilateral (at [facility] and [facility] only)  Result Date: 03/28/2019 CLINICAL DATA:  TIA, left facial droop, weakness, dysphagia, hypertension and diabetes. EXAM: BILATERAL CAROTID DUPLEX ULTRASOUND TECHNIQUE: Pearline Cables scale imaging, color Doppler and duplex ultrasound were performed of bilateral carotid and vertebral arteries in the neck. COMPARISON:  None. FINDINGS: Criteria: Quantification of carotid stenosis is based on velocity parameters that correlate the residual internal carotid diameter with NASCET-based stenosis levels, using the diameter of the distal internal carotid lumen as the denominator for stenosis measurement. The following velocity measurements were obtained: RIGHT ICA:  72/25 cm/sec CCA:  [id number] cm/sec SYSTOLIC ICA/CCA RATIO:  0.7 ECA:  87 cm/sec LEFT ICA:  94/25 cm/sec CCA:  [id number] cm/sec SYSTOLIC ICA/CCA RATIO:  1.0 ECA:  127 cm/sec RIGHT CAROTID ARTERY: The common carotid artery and carotid bulb demonstrate mild intimal thickening. No focal plaque identified. No evidence of right-sided carotid stenosis. RIGHT VERTEBRAL ARTERY: Antegrade flow with normal waveform and velocity. LEFT CAROTID ARTERY: The common carotid artery demonstrates intimal thickening. There may be trace plaque in the carotid bulb. No evidence of left ICA plaque or stenosis. LEFT VERTEBRAL ARTERY: Antegrade flow with normal waveform and  velocity. IMPRESSION: Trace plaque in the left carotid bulb. No evidence of bilateral internal carotid artery plaque or stenosis. Electronically Signed   By: Aletta Edouard M.D.   On: 03/28/2019 15:55   DG Chest Portable 1 View  Result Date: 03/27/2019 CLINICAL DATA:  Difficulty swallowing EXAM: PORTABLE CHEST 1 VIEW COMPARISON:  03/20/2019 FINDINGS: The heart size and mediastinal contours are within normal limits. Both lungs are clear. The visualized skeletal  structures are unremarkable. IMPRESSION: No active disease. Electronically Signed   By: Donavan Foil M.D.   On: 03/27/2019 22:06   ECHOCARDIOGRAM COMPLETE  Result Date: 03/28/2019    ECHOCARDIOGRAM REPORT   Patient Name:   Peter Parsons Date of Exam: 03/28/2019 Medical Rec #:  [id number]    Height:       69.0 in Accession #:    [id number]   Weight:       156.1 lb Date of Birth:  [birth year]    BSA:          1.86 m Patient Age:    11 years     BP:           116/84 mmHg Patient Gender: M            HR:           66 bpm. Exam Location:  [facility] Procedure: 2D Echo, Cardiac Doppler and Color Doppler Indications:     TIA 435.9  History:         Patient has no prior history of Echocardiogram examinations.                  Risk Factors:Hypertension and Diabetes.  Sonographer:     Alyse Low Roar Referring Phys:  [id number] [place] Diagnosing Phys: Serafina Royals MD  Sonographer Comments: Technically difficult study due to poor echo windows. IMPRESSIONS  1. Left ventricular ejection fraction, by estimation, is 50 to 55%. The left ventricle has low normal function. LV endocardial border not optimally defined to evaluate regional wall motion. Left ventricular diastolic parameters were normal.  2. Right ventricular systolic function is normal. The right ventricular size is mildly enlarged. There is normal pulmonary artery systolic pressure.  3. The mitral valve is normal in structure and function. Mild mitral valve regurgitation.  4. The aortic valve is normal in structure and function. Aortic valve regurgitation is not visualized. FINDINGS  Left Ventricle: Left ventricular ejection fraction, by estimation, is 50 to 55%. The left ventricle has low normal function. LV endocardial border not optimally defined to evaluate regional wall motion. There is no left ventricular hypertrophy. Left ventricular diastolic parameters were normal. Right Ventricle: The right ventricular size is mildly enlarged. No increase in right ventricular wall  thickness. Right ventricular systolic function is normal. There is normal pulmonary artery systolic pressure. The tricuspid regurgitant velocity is 2.16  m/s, and with an assumed right atrial pressure of 10 mmHg, the estimated right ventricular systolic pressure is 0000000 mmHg. Left Atrium: Left atrial size was normal in size. Right Atrium: Right atrial size was normal in size. Pericardium: There is no evidence of pericardial effusion. Mitral Valve: The mitral valve is normal in structure and function. Mild mitral valve regurgitation. Tricuspid Valve: The tricuspid valve is normal in structure. Tricuspid valve regurgitation is mild. Aortic Valve: The aortic valve is normal in structure and function. Aortic valve regurgitation is not visualized. Pulmonic Valve: The pulmonic valve was normal in structure. Pulmonic valve regurgitation is not visualized. Aorta: The aortic root and  ascending aorta are structurally normal, with no evidence of dilitation. IAS/Shunts: No atrial level shunt detected by color flow Doppler.  LEFT VENTRICLE PLAX 2D LVIDd:         4.71 cm  Diastology LVIDs:         3.54 cm  LV e' lateral:   13.90 cm/s LV PW:         1.06 cm  LV E/e' lateral: 4.4 LV IVS:        1.19 cm  LV e' medial:    9.57 cm/s LVOT diam:     1.90 cm  LV E/e' medial:  6.4 LV SV Index:   27.21 LVOT Area:     2.84 cm  RIGHT VENTRICLE RV Mid diam:    3.59 cm RV S prime:     16.30 cm/s TAPSE (M-mode): 1.9 cm LEFT ATRIUM           Index       RIGHT ATRIUM           Index LA diam:      4.20 cm 2.26 cm/m  RA Area:     17.80 cm LA Vol (A2C): 67.5 ml 36.30 ml/m RA Volume:   46.00 ml  24.74 ml/m LA Vol (A4C): 40.8 ml 21.94 ml/m                        PULMONIC VALVE AORTA                 PV Vmax:        0.58 m/s Ao Root diam: 3.70 cm PV Peak grad:   1.4 mmHg                       RVOT Peak grad: 1 mmHg  MITRAL VALVE               TRICUSPID VALVE MV Area (PHT): 4.36 cm    TR Peak grad:   18.7 mmHg MV Decel Time: 174 msec    TR Vmax:         216.00 cm/s MV E velocity: 61.60 cm/s MV A velocity: 48.00 cm/s  SHUNTS MV E/A ratio:  1.28        Systemic Diam: 1.90 cm Serafina Royals MD Electronically signed by Serafina Royals MD Signature Date/Time: 03/28/2019/4:26:59 PM    Final     Scheduled Meds:  atorvastatin  10 mg Oral Daily   chlorhexidine  15 mL Mouth/Throat BID   enoxaparin (LOVENOX) injection  40 mg Subcutaneous Q24H   escitalopram  5 mg Oral Daily   gabapentin  300 mg Oral TID   insulin aspart  0-9 Units Subcutaneous TID PC & HS   multivitamin with minerals  1 tablet Oral Daily   prazosin  2 mg Oral QHS   risperiDONE  3 mg Oral BID   tamsulosin  0.4 mg Oral Daily   traZODone  150 mg Oral QHS   Continuous Infusions:  dextrose 50 mL/hr at 03/28/19 1857   PRN Meds: clonazePAM, dextrose, senna-docusate  Time spent: 35 minutes  Author: Berle Mull, MD [facility] 03/28/2019 7:10 PM  To reach On-call, see care teams to locate the attending and reach out to them via [email or web address]. If 7PM-7AM, please contact night-coverage If you still have difficulty reaching the attending provider, please page the [facility] (Director on Call) for [facility] on amion for assistance.

## 2019-03-28 NOTE — ED Notes (Addendum)
Oral care performed, pt request for water declined and reason for dysphagia given   Pt reports "crapped pants", pt cleaned and small amount of wet stool noted and cleaned, behavioral clothing removed and diaper applied, pt cleaned and linen changed   DR Mansy at bedside, pt reports difficulty swallowing and loose stool, pt denies SOB, or pain, reports difficulty finding words and slurred speech with weakness that started yesterday

## 2019-03-28 NOTE — Progress Notes (Signed)
Physical Therapy Evaluation Patient Details Name: Peter Parsons MRN: [id number] DOB: Apr 17, 1960 Today's Date: 03/28/2019   History of Present Illness  Per MD note:Caucasian male with a known history of type 2 diabetes mellitus, hypertension and schizophrenia who has been in the [place] for the last 44 days, presented with acute onset of generalized weakness   Clinical Impression  Patient agrees to PT eval. He does respond verbally but with a delay and not consistent. He does follow verbal commands but again not consistently and there is a delay. He is MI for bed mobility and transfers sit to stand MI. He ambulates MI without AD 300 feet with decreased gait speed and narrow base of support. His standing and sitting balance is WFL. His strength of BLE is WFL. He has no skilled PT needs at this time and will be DC from PT.    Follow Up Recommendations Supervision for mobility/OOB    Equipment Recommendations       Recommendations for Other Services       Precautions / Restrictions Restrictions Weight Bearing Restrictions: No      Mobility  Bed Mobility                  Transfers Overall transfer level: Modified independent Equipment used: 1 person hand held assist             General transfer comment: extra time required  Ambulation/Gait Ambulation/Gait assistance: Modified independent (Device/Increase time) Gait Distance (Feet): 300 Feet Assistive device: None Gait Pattern/deviations: (narrow base of support)        Stairs            Wheelchair Mobility    Modified Rankin (Stroke Patients Only)       Balance Overall balance assessment: Independent                                           Pertinent Vitals/Pain Pain Assessment: No/denies pain    Home Living Family/patient expects to be discharged to:: (unknown) Living Arrangements: Parent                    Prior Function Level of Independence: Independent                Hand Dominance        Extremity/Trunk Assessment        Lower Extremity Assessment Lower Extremity Assessment: Overall WFL for tasks assessed       Communication   Communication: Other (comment)  Cognition Arousal/Alertness: Awake/alert   Overall Cognitive Status: Difficult to assess                                        General Comments      Exercises     Assessment/Plan    PT Assessment Patent does not need any further PT services  PT Problem List         PT Treatment Interventions      PT Goals (Current goals can be found in the Care Plan section)       Frequency     Barriers to discharge        Co-evaluation               AM-PAC PT "6 Clicks" Mobility  Outcome  Measure Help needed turning from your back to your side while in a flat bed without using bedrails?: A Little Help needed moving from lying on your back to sitting on the side of a flat bed without using bedrails?: None Help needed moving to and from a bed to a chair (including a wheelchair)?: None Help needed standing up from a chair using your arms (e.g., wheelchair or bedside chair)?: None Help needed to walk in hospital room?: None Help needed climbing 3-5 steps with a railing? : A Little 6 Click Score: 22    End of Session Equipment Utilized During Treatment: Gait belt Activity Tolerance: Patient tolerated treatment well Patient left: in bed;with nursing/sitter in room Nurse Communication: Mobility status PT Visit Diagnosis: Difficulty in walking, not elsewhere classified (R26.2)    Time: [id number] PT Time Calculation (min) (ACUTE ONLY): 15 min   Charges:   PT Evaluation $PT Eval Low Complexity: 1 Low            Alanson Puls, PT DPT 03/28/2019, 10:19 AM

## 2019-03-28 NOTE — Plan of Care (Signed)
Pt appears lethargic but answers questions appropriately. Pt report he cannot breath but does not seem to be in distress. Will continue to monitor.

## 2019-03-28 NOTE — ED Notes (Signed)
Registration notified this RN that pt is walking around room and looking in cabinets, this RN and Christina, NT, ambulated pt to toilet and pt has loose stool, blanchable red area noted to pt's upper sacral area, pt able to walk with standby assist back to bed

## 2019-03-28 NOTE — ED Notes (Signed)
Call for report - floor doesn't have sitter - pt IVC, [place] [employer], RN to call back this Probation officer for report when sitter arrives, charge notified

## 2019-03-28 NOTE — ED Notes (Signed)
This RN spoke with Drema Halon, [place] [facility]PS Supervisor who is on call social worker this weekend and informed her pt is being admitted to the hospital.

## 2019-03-28 NOTE — H&P (Signed)
[facility] NAME: Peter Parsons    MR#:  [id number]  DATE OF BIRTH:  12-30-1960  DATE OF ADMISSION:  02/11/2019  PRIMARY CARE PHYSICIAN: System, Provider Not In   REQUESTING/REFERRING PHYSICIAN: Arta Silence, MD CHIEF COMPLAINT:   Chief Complaint  Patient presents with  . ivc    HISTORY OF PRESENT ILLNESS:  Peter Parsons  is a 59 y.o. Caucasian male with a known history of type 2 diabetes mellitus, hypertension and schizophrenia who has been in the [facility] for the last 44 days, presented with acute onset of generalized weakness she has been getting worse to the point he was not able to hold himself upright and was having dysphagia.  He was also noted to have left facial droop.  The patient admits to slurred speech as well as expressive dysphasia that started earlier yesterday.  He has been feeling generally weak.  He denied any paresthesias or focal muscle weakness.  No tinnitus or vertigo.  He admits to loose bowel movements with stool incontinence.  No dysuria, oliguria or hematuria or flank pain.  Not any headache or dizziness.  No chest pain or dyspnea or palpitations cough or wheezing or hemoptysis.  No COVID-19 exposure.Marland Kitchen  Upon presenting to the emergency room, vital signs were within normal and later heart rate was 115.  Labs revealed a BUN of 30 and creatinine 1.86 close to his baseline, mild anemia with hemoglobin 11.3 and hematocrit 31.9 compared to 10 and 28.8 a week ago with platelets of 147 which better.  EKG showed normal sinus rhythm with rate of 97 with LAD and left anterior fascicular block  EKG showed normal sinus rhythm with rate of 97 with LAD and left anterior fascicular block.  Chest x-ray showed no acute cardiopulmonary disease.  Noncontrasted head CT scan revealed mild right temporal scalp swelling that is nonspecific with no acute intracranial abnormalities.  The patient had no recent falls.  The patient was given 1 L bolus of IV  normal saline.  He will be admitted to a medical monitored bed for further evaluation and management. PAST MEDICAL HISTORY:   Past Medical History:  Diagnosis Date  . Diabetes mellitus without complication ([place])   . Hypertension   . Schizophrenia ([place])     PAST SURGICAL HISTORY:  History reviewed. No pertinent surgical history.  SOCIAL HISTORY:   Social History   Tobacco Use  . Smoking status: Former Research scientist (life sciences)  . Smokeless tobacco: Never Used  Substance Use Topics  . Alcohol use: Not Currently    FAMILY HISTORY:  History reviewed. No pertinent family history.  DRUG ALLERGIES:   Allergies  Allergen Reactions  . Acetaminophen Other (See Comments)    Unknown Unable to breathe Chest  tightness/SOB     REVIEW OF SYSTEMS:   As per history of present illness. All pertinent systems were reviewed above. Constitutional,  HEENT, cardiovascular, respiratory, GI, GU, musculoskeletal, neuro, psychiatric, endocrine,  integumentary and hematologic systems were reviewed and are otherwise  negative/unremarkable except for positive findings mentioned above in the HPI.   MEDICATIONS AT HOME:   Prior to Admission medications   Medication Sig Start Date End Date Taking? Authorizing Provider  atorvastatin (LIPITOR) 10 MG tablet Take 10 mg by mouth daily.   Yes [provider]  gabapentin (NEURONTIN) 300 MG capsule Take 300 mg by mouth 3 (three) times daily. 11/05/18  Yes [provider]  lisinopril (ZESTRIL) 20 MG tablet Take 1 tablet (20  mg total) by mouth daily. 01/06/19  Yes Starkes-Perry, Gayland Curry, FNP  metFORMIN (GLUCOPHAGE) 500 MG tablet Take 1 tablet (500 mg total) by mouth 2 (two) times daily with a meal. 01/05/19  Yes Starkes-Perry, Gayland Curry, FNP  paliperidone (INVEGA SUSTENNA) 234 MG/1.5ML SUSY injection Inject 234 mg into the muscle every 28 (twenty-eight) days. 01/14/19  Yes Starkes-Perry, Gayland Curry, FNP  risperiDONE (RISPERDAL) 3 MG tablet Take 1 tablet (3 mg  total) by mouth 2 (two) times daily. 01/05/19  Yes Starkes-Perry, Gayland Curry, FNP  tamsulosin (FLOMAX) 0.4 MG CAPS capsule Take 0.4 mg by mouth daily. 08/12/18  Yes [provider]  traZODone (DESYREL) 150 MG tablet Take 150 mg by mouth at bedtime.  11/09/18  Yes [provider]      VITAL SIGNS:  Blood pressure 138/82, pulse (!) 101, temperature 98.9 F (37.2 C), temperature source Oral, resp. rate 15, height 5\' 11"  (1.803 m), weight 77.1 kg, SpO2 98 %.  PHYSICAL EXAMINATION:  Physical Exam  GENERAL:  59 y.o.-year-old Caucasian male patient lying in the bed with no acute distress.  EYES: Pupils equal, round, reactive to light and accommodation. No scleral icterus. Extraocular muscles intact.  HEENT: Head atraumatic, normocephalic. Oropharynx and nasopharynx clear.  NECK:  Supple, no jugular venous distention. No thyroid enlargement, no tenderness.  LUNGS: Normal breath sounds bilaterally, no wheezing, rales,rhonchi or crepitation. No use of accessory muscles of respiration.  CARDIOVASCULAR: Regular rate and rhythm, S1, S2 normal. No murmurs, rubs, or gallops.  ABDOMEN: Soft, nondistended, nontender. Bowel sounds present. No organomegaly or mass.  EXTREMITIES: No pedal edema, cyanosis, or clubbing.  NEUROLOGIC: Cranial nerves II through XII are intact except for expressive dysphasia with mild dysarthria muscle strength 5/5 in all extremities. Sensation intact. Gait not checked.  PSYCHIATRIC: The patient is alert and oriented x 3.  Normal affect and good eye contact. SKIN: No obvious rash, lesion, or ulcer.   LABORATORY PANEL:   CBC Recent Labs  Lab 03/27/19 2152  WBC 5.4  HGB 11.3*  HCT 31.9*  PLT 147*   ------------------------------------------------------------------------------------------------------------------  Chemistries  Recent Labs  Lab 03/27/19 2152  NA 142  K 4.5  CL 102  CO2 29  GLUCOSE 93  BUN 30*  CREATININE 1.86*  CALCIUM 9.4  AST 21    ALT 13  ALKPHOS 59  BILITOT 0.8   ------------------------------------------------------------------------------------------------------------------  Cardiac Enzymes No results for input(s): TROPONINI in the last 168 hours. ------------------------------------------------------------------------------------------------------------------  RADIOLOGY:  CT Head Wo Contrast  Result Date: 03/27/2019 CLINICAL DATA:  Focal neuro deficit for greater than 6 hours, lethargic, not answering questions EXAM: CT HEAD WITHOUT CONTRAST TECHNIQUE: Contiguous axial images were obtained from the base of the skull through the vertex without intravenous contrast. COMPARISON:  CT head 02/11/2019 FINDINGS: Brain: No evidence of acute infarction, hemorrhage, hydrocephalus, extra-axial collection or mass lesion/mass effect. Vascular: Atherosclerotic calcification of the carotid siphons and intradural vertebral arteries. No hyperdense vessel. Skull: No calvarial fracture or suspicious osseous lesion. Mild right temporal scalp swelling without large hematoma. Sinuses/Orbits: Paranasal sinuses and mastoid air cells are predominantly clear. Included orbital structures are unremarkable. Other: None IMPRESSION: No acute intracranial findings. Mild right temporal scalp swelling, nonspecific. Electronically Signed   By: Lovena Le M.D.   On: 03/27/2019 22:25   DG Chest Portable 1 View  Result Date: 03/27/2019 CLINICAL DATA:  Difficulty swallowing EXAM: PORTABLE CHEST 1 VIEW COMPARISON:  03/20/2019 FINDINGS: The heart size and mediastinal contours are within normal limits. Both lungs are  clear. The visualized skeletal structures are unremarkable. IMPRESSION: No active disease. Electronically Signed   By: Donavan Foil M.D.   On: 03/27/2019 22:06      IMPRESSION AND PLAN:   1.  Generalized weakness, with dysphagia and dysarthria, left facial droop, rule out acute CVA with differential diagnosis including TIA.  The patient  will be admitted to an observation medical monitored bed.  We will follow neuro checks every 4 hours for 24 hours.  We will place the patient on aspirin and statin therapy and check fasting lipids.  A 2D echo and bilateral carotid Doppler to assess for TIA. He will have a brain MRI without contrast in a.m. a neurology consult with Dr. Irish Elders will be obtained in a.m. I notified him regarding the patient.  2.  Type 2 diabetes mellitus with stage IIIa chronic kidney disease.  The patient will be placed on supplement coverage with NovoLog and will hold metformin for now.  3.  Hypertension.  We will continue prazosin.  4.  BPH.  We will continue Flomax.  5.  Peripheral neuropathy.  We will continue Neurontin.  6.  Depression with anxiety, bipolar disorder and schizophrenia.  We will continue Lexapro, Risperdal and Klonopin.  7.  Dyslipidemia.  We will continue statin therapy.  8.  DVT prophylaxis.  Subcutaneous Lovenox   All the records are reviewed and case discussed with ED provider. The plan of care was discussed in details with the patient (and family). I answered all questions. The patient agreed to proceed with the above mentioned plan. Further management will depend upon hospital course.   CODE STATUS: Full code  TOTAL TIME TAKING CARE OF THIS PATIENT: 60 minutes.    Christel Mormon M.D on 03/28/2019 at 12:07 AM  [facility]   From 7 PM-7 AM, contact night-coverage [email or web address]  CC: Primary care physician; System, Provider Not In   Note: This dictation was prepared with Dragon dictation along with smaller phrase technology. Any transcriptional errors that result from this process are unintentional.

## 2019-03-28 NOTE — ED Notes (Signed)
Call to 1C for status, [place] Miners, RN reports sitter just went to lunch and another hour before report will be accepted

## 2019-03-28 NOTE — ED Notes (Signed)
Pt returned to room from MRI att

## 2019-03-28 NOTE — Progress Notes (Signed)
*  PRELIMINARY RESULTS* Echocardiogram 2D Echocardiogram has been performed.  Jonette Mate Morgaine Kimball 03/28/2019, 3:21 PM

## 2019-03-28 NOTE — ED Notes (Signed)
[place] Miners, RN aware that pt will need the hospital social work liaison to send the pt's FL2 to facilityies that accept the pt for long term placement and that pt was on the wait list for [facility]

## 2019-03-29 ENCOUNTER — Observation Stay: Payer: Medicaid Other

## 2019-03-29 DIAGNOSIS — Z515 Encounter for palliative care: Secondary | ICD-10-CM | POA: Diagnosis not present

## 2019-03-29 DIAGNOSIS — G9341 Metabolic encephalopathy: Secondary | ICD-10-CM | POA: Diagnosis present

## 2019-03-29 DIAGNOSIS — N170 Acute kidney failure with tubular necrosis: Secondary | ICD-10-CM | POA: Diagnosis present

## 2019-03-29 DIAGNOSIS — D62 Acute posthemorrhagic anemia: Secondary | ICD-10-CM | POA: Diagnosis not present

## 2019-03-29 DIAGNOSIS — R339 Retention of urine, unspecified: Secondary | ICD-10-CM | POA: Diagnosis not present

## 2019-03-29 DIAGNOSIS — Y92230 Patient room in hospital as the place of occurrence of the external cause: Secondary | ICD-10-CM | POA: Diagnosis not present

## 2019-03-29 DIAGNOSIS — E1122 Type 2 diabetes mellitus with diabetic chronic kidney disease: Secondary | ICD-10-CM | POA: Diagnosis present

## 2019-03-29 DIAGNOSIS — N390 Urinary tract infection, site not specified: Secondary | ICD-10-CM | POA: Diagnosis not present

## 2019-03-29 DIAGNOSIS — R310 Gross hematuria: Secondary | ICD-10-CM | POA: Diagnosis not present

## 2019-03-29 DIAGNOSIS — F200 Paranoid schizophrenia: Secondary | ICD-10-CM | POA: Diagnosis present

## 2019-03-29 DIAGNOSIS — Y846 Urinary catheterization as the cause of abnormal reaction of the patient, or of later complication, without mention of misadventure at the time of the procedure: Secondary | ICD-10-CM | POA: Diagnosis not present

## 2019-03-29 DIAGNOSIS — R7881 Bacteremia: Secondary | ICD-10-CM | POA: Diagnosis not present

## 2019-03-29 DIAGNOSIS — E43 Unspecified severe protein-calorie malnutrition: Secondary | ICD-10-CM | POA: Diagnosis present

## 2019-03-29 DIAGNOSIS — R45851 Suicidal ideations: Secondary | ICD-10-CM | POA: Diagnosis present

## 2019-03-29 DIAGNOSIS — R6521 Severe sepsis with septic shock: Secondary | ICD-10-CM | POA: Diagnosis not present

## 2019-03-29 DIAGNOSIS — R531 Weakness: Secondary | ICD-10-CM | POA: Diagnosis not present

## 2019-03-29 DIAGNOSIS — S3730XA Unspecified injury of urethra, initial encounter: Secondary | ICD-10-CM | POA: Diagnosis not present

## 2019-03-29 DIAGNOSIS — R627 Adult failure to thrive: Secondary | ICD-10-CM | POA: Diagnosis present

## 2019-03-29 DIAGNOSIS — T83511A Infection and inflammatory reaction due to indwelling urethral catheter, initial encounter: Secondary | ICD-10-CM | POA: Diagnosis not present

## 2019-03-29 DIAGNOSIS — F3130 Bipolar disorder, current episode depressed, mild or moderate severity, unspecified: Secondary | ICD-10-CM | POA: Diagnosis present

## 2019-03-29 DIAGNOSIS — L89152 Pressure ulcer of sacral region, stage 2: Secondary | ICD-10-CM | POA: Diagnosis not present

## 2019-03-29 DIAGNOSIS — A419 Sepsis, unspecified organism: Secondary | ICD-10-CM | POA: Diagnosis not present

## 2019-03-29 DIAGNOSIS — R1310 Dysphagia, unspecified: Secondary | ICD-10-CM | POA: Diagnosis present

## 2019-03-29 DIAGNOSIS — F0390 Unspecified dementia without behavioral disturbance: Secondary | ICD-10-CM | POA: Diagnosis not present

## 2019-03-29 DIAGNOSIS — A4181 Sepsis due to Enterococcus: Secondary | ICD-10-CM | POA: Diagnosis not present

## 2019-03-29 DIAGNOSIS — R338 Other retention of urine: Secondary | ICD-10-CM | POA: Diagnosis not present

## 2019-03-29 DIAGNOSIS — B952 Enterococcus as the cause of diseases classified elsewhere: Secondary | ICD-10-CM | POA: Diagnosis not present

## 2019-03-29 DIAGNOSIS — N320 Bladder-neck obstruction: Secondary | ICD-10-CM | POA: Diagnosis not present

## 2019-03-29 DIAGNOSIS — R410 Disorientation, unspecified: Secondary | ICD-10-CM | POA: Diagnosis not present

## 2019-03-29 DIAGNOSIS — E11649 Type 2 diabetes mellitus with hypoglycemia without coma: Secondary | ICD-10-CM | POA: Diagnosis present

## 2019-03-29 DIAGNOSIS — F0391 Unspecified dementia with behavioral disturbance: Secondary | ICD-10-CM | POA: Diagnosis present

## 2019-03-29 DIAGNOSIS — T83511D Infection and inflammatory reaction due to indwelling urethral catheter, subsequent encounter: Secondary | ICD-10-CM | POA: Diagnosis not present

## 2019-03-29 DIAGNOSIS — R471 Dysarthria and anarthria: Secondary | ICD-10-CM | POA: Diagnosis present

## 2019-03-29 DIAGNOSIS — R29810 Facial weakness: Secondary | ICD-10-CM | POA: Diagnosis present

## 2019-03-29 DIAGNOSIS — R4189 Other symptoms and signs involving cognitive functions and awareness: Secondary | ICD-10-CM | POA: Diagnosis present

## 2019-03-29 DIAGNOSIS — T8383XA Hemorrhage of genitourinary prosthetic devices, implants and grafts, initial encounter: Secondary | ICD-10-CM | POA: Diagnosis not present

## 2019-03-29 DIAGNOSIS — Z20822 Contact with and (suspected) exposure to covid-19: Secondary | ICD-10-CM | POA: Diagnosis present

## 2019-03-29 DIAGNOSIS — E785 Hyperlipidemia, unspecified: Secondary | ICD-10-CM | POA: Diagnosis present

## 2019-03-29 DIAGNOSIS — N400 Enlarged prostate without lower urinary tract symptoms: Secondary | ICD-10-CM | POA: Diagnosis not present

## 2019-03-29 LAB — BASIC METABOLIC PANEL
Anion gap: 9 (ref 5–15)
BUN: 24 mg/dL — ABNORMAL HIGH (ref 6–20)
CO2: 28 mmol/L (ref 22–32)
Calcium: 9 mg/dL (ref 8.9–10.3)
Chloride: 102 mmol/L (ref 98–111)
Creatinine, Ser: 1.28 mg/dL — ABNORMAL HIGH (ref 0.61–1.24)
GFR calc Af Amer: >60 mL/min (ref >60)
GFR calc non Af Amer: >60 mL/min (ref >60)
Glucose, Bld: 89 mg/dL (ref 70–99)
Potassium: 4 mmol/L (ref 3.5–5.1)
Sodium: 139 mmol/L (ref 135–145)

## 2019-03-29 LAB — GLUCOSE, CAPILLARY
Glucose-Capillary: 132 mg/dL — ABNORMAL HIGH (ref 70–99)
Glucose-Capillary: 135 mg/dL — ABNORMAL HIGH (ref 70–99)
Glucose-Capillary: 83 mg/dL (ref 70–99)
Glucose-Capillary: 137 mg/dL — ABNORMAL HIGH (ref 70–99)
Glucose-Capillary: 226 mg/dL — ABNORMAL HIGH (ref 70–99)

## 2019-03-29 LAB — CK: Total CK: 106 U/L (ref 49–397)

## 2019-03-29 LAB — CBC
HCT: 28.9 % — ABNORMAL LOW (ref 39.0–52.0)
Hemoglobin: 10.2 g/dL — ABNORMAL LOW (ref 13.0–17.0)
MCH: 30.3 pg (ref 26.0–34.0)
MCHC: 35.3 g/dL (ref 30.0–36.0)
MCV: 85.8 fL (ref 80.0–100.0)
Platelets: 131 10*3/uL — ABNORMAL LOW (ref 150–400)
RBC: 3.37 MIL/uL — ABNORMAL LOW (ref 4.22–5.81)
RDW: 13.2 % (ref 11.5–15.5)
WBC: 4.3 10*3/uL (ref 4.0–10.5)
nRBC: 0 % (ref 0.0–0.2)

## 2019-03-29 LAB — SEDIMENTATION RATE: Sed Rate: 29 mm/hr — ABNORMAL HIGH (ref 0–20)

## 2019-03-29 MED ORDER — NEPRO/CARBSTEADY PO LIQD
237.0000 mL | Freq: Three times a day (TID) | ORAL | Status: DC
  Administered 2019-03-29 – 2019-07-12 (×285): 237 mL via ORAL

## 2019-03-29 MED ORDER — ESCITALOPRAM OXALATE 10 MG PO TABS
10.0000 mg | ORAL_TABLET | Freq: Every day | ORAL | Status: DC
  Administered 2019-03-30 – 2019-07-11 (×101): 10 mg via ORAL
  Filled 2019-03-29 (×107): qty 1

## 2019-03-29 NOTE — Progress Notes (Signed)
[facility] Progress Note  Patient: Peter Parsons    [id number]  DOA: 02/11/2019     Date of Service: the patient was seen and examined on 03/29/2019  Chief Complaint  Patient presents with   ivc   Brief hospital course: Initially presented on 02/11/2019 from group home as he has suicidal ideation and was wandering off in the woods.  Prior to this patient had multiple ER visits with injuries after he ran out of his group home.  Patient was admitted at behavioral health for suicidal ideation in November 2020. Initial psychiatric evaluation recommended inpatient psychiatric admission at [facility]. On 02/18/2019 psychiatric recommended patient is cleared from psychiatric point of view and now patient requires a locked memory care unit. While awaiting for placement in ER, EDP noted increasing confusion, generalized weakness and dysphagia and patient was referred for further work-up for admission. MRI brain negative for any acute stroke. Speech evaluation pending. Currently further plan is complete further work-up and consult psychiatry as well as TOC team for assistance.  Assessment and Plan: 1.  Dysphagia, dysarthria, left facial droop CVA ruled out with MRI. Currently still has some dysphagia. Potentially medication related. Consult psychiatry. Speech therapy evaluation pending. PT recommends supervision for mobility. Psychiatric team initially recommended locked memory care unit.  2.  Depression, anxiety, bipolar disorder and schizophrenia. Frequent suicidal ideation. Currently IVC. Patient definitely does not have any capacity to make medical decisions. Patient is from a group home. Continue Lexapro Risperdal and Klonopin for now.  Continue prazosin as well.  3.  Type 2 diabetes mellitus uncontrolled with chronic kidney disease stage IIIa as well as peripheral neuropathy. Hold Metformin. Monitor renal function. Avoid nephrotoxic medications. Continue sliding  scale insulin but avoid long-acting insulin as the patient is hypoglycemic.  4.  BPH Continue Flomax  5.  Dyslipidemia Continue statin  6.  Essential hypertension Patient is on lisinopril for renal protective effect but we will discontinue that given his potassium is mildly elevated as well as renal function is elevated. Monitor blood pressure for now.  7.  B12 deficiency.  Folate deficiency We will replace with subcu B12 and oral replacement.  Diet: Currently n.p.o. DVT Prophylaxis: Subcutaneous Heparin    Advance goals of care discussion: Full code  Family Communication: no family was present at bedside, at the time of interview.  Patient has a legal guardian.  Disposition:  Pt is from group home, admitted with dysarthria and dysphagia, originally brought in for suicidal ideation and currently IVC, still has confusion due to his schizophrenia, which precludes a safe discharge.  TOC team is working to identify locked memory care unit.  FL 2 send out on 02/26/2019. Discharge to memory care unit, when bed available.  Subjective: No acute complaints no acute events.  Was hypoglycemic last night.  Physical Exam: General: alert not oriented to time, place, and person.  Appear in mild distress, affect depressed Eyes: PERRL ENT: Oral Mucosa Clear, moist  Neck: no JVD,  Cardiovascular: S1 and S2 Present, no Murmur,  Respiratory: good respiratory effort, Bilateral Air entry equal and Decreased, no Crackles, no wheezes Abdomen: Bowel Sound present, Soft and no tenderness,  Skin: no rash Extremities: no Pedal edema, no calf tenderness Neurologic: without any new focal findings  Gait not checked due to patient safety concerns  Vitals:   03/28/19 1019 03/28/19 1223 03/28/19 2002 03/28/19 2308  BP: 114/65 109/66 129/82 139/83  Pulse: 76 (!) 53 73 90  Resp: 16 17 14  15  Temp: 97.9 F (36.6 C) 97.8 F (36.6 C) 99 F (37.2 C) 99.1 F (37.3 C)  TempSrc: Oral Oral Oral Oral  SpO2:  99% 98% 98% 98%  Weight:      Height:        Intake/Output Summary (Last 24 hours) at 03/29/2019 0734 Last data filed at 03/29/2019 0732 Gross per 24 hour  Intake 1808.11 ml  Output 1075 ml  Net 733.11 ml   Filed Weights   02/11/19 1441 03/28/19 0306  Weight: 77.1 kg 70.8 kg    Data Reviewed: I have personally reviewed and interpreted daily labs, tele strips, imagings as discussed above. I reviewed all nursing notes, pharmacy notes, vitals, pertinent old records I have discussed plan of care as described above with RN and patient/family.  CBC: Recent Labs  Lab 03/27/19 2152  WBC 5.4  NEUTROABS 3.5  HGB 11.3*  HCT 31.9*  MCV 83.9  PLT [id number]*   Basic Metabolic Panel: Recent Labs  Lab 03/27/19 2152  NA 142  K 4.5  CL 102  CO2 29  GLUCOSE 93  BUN 30*  CREATININE 1.86*  CALCIUM 9.4    Studies: US Carotid Bilateral (at [facility] and [facility] only)  Result Date: 03/28/2019 CLINICAL DATA:  TIA, left facial droop, weakness, dysphagia, hypertension and diabetes. EXAM: BILATERAL CAROTID DUPLEX ULTRASOUND TECHNIQUE: Pearline Cables scale imaging, color Doppler and duplex ultrasound were performed of bilateral carotid and vertebral arteries in the neck. COMPARISON:  None. FINDINGS: Criteria: Quantification of carotid stenosis is based on velocity parameters that correlate the residual internal carotid diameter with NASCET-based stenosis levels, using the diameter of the distal internal carotid lumen as the denominator for stenosis measurement. The following velocity measurements were obtained: RIGHT ICA:  72/25 cm/sec CCA:  [id number] cm/sec SYSTOLIC ICA/CCA RATIO:  0.7 ECA:  87 cm/sec LEFT ICA:  94/25 cm/sec CCA:  [id number] cm/sec SYSTOLIC ICA/CCA RATIO:  1.0 ECA:  127 cm/sec RIGHT CAROTID ARTERY: The common carotid artery and carotid bulb demonstrate mild intimal thickening. No focal plaque identified. No evidence of right-sided carotid stenosis. RIGHT VERTEBRAL ARTERY: Antegrade flow with normal waveform and  velocity. LEFT CAROTID ARTERY: The common carotid artery demonstrates intimal thickening. There may be trace plaque in the carotid bulb. No evidence of left ICA plaque or stenosis. LEFT VERTEBRAL ARTERY: Antegrade flow with normal waveform and velocity. IMPRESSION: Trace plaque in the left carotid bulb. No evidence of bilateral internal carotid artery plaque or stenosis. Electronically Signed   By: Aletta Edouard M.D.   On: 03/28/2019 15:55   ECHOCARDIOGRAM COMPLETE  Result Date: 03/28/2019    ECHOCARDIOGRAM REPORT   Patient Name:   Peter Parsons Date of Exam: 03/28/2019 Medical Rec #:  [id number]    Height:       69.0 in Accession #:    [id number]   Weight:       156.1 lb Date of Birth:  1960-02-14    BSA:          1.86 m Patient Age:    5 years     BP:           116/84 mmHg Patient Gender: M            HR:           66 bpm. Exam Location:  [facility] Procedure: 2D Echo, Cardiac Doppler and Color Doppler Indications:     TIA 435.9  History:         Patient has no prior history of Echocardiogram  examinations.                  Risk Factors:Hypertension and Diabetes.  Sonographer:     Alyse Low Roar Referring Phys:  [id number] [place] Diagnosing Phys: Serafina Royals MD  Sonographer Comments: Technically difficult study due to poor echo windows. IMPRESSIONS  1. Left ventricular ejection fraction, by estimation, is 50 to 55%. The left ventricle has low normal function. LV endocardial border not optimally defined to evaluate regional wall motion. Left ventricular diastolic parameters were normal.  2. Right ventricular systolic function is normal. The right ventricular size is mildly enlarged. There is normal pulmonary artery systolic pressure.  3. The mitral valve is normal in structure and function. Mild mitral valve regurgitation.  4. The aortic valve is normal in structure and function. Aortic valve regurgitation is not visualized. FINDINGS  Left Ventricle: Left ventricular ejection fraction, by estimation, is 50 to 55%.  The left ventricle has low normal function. LV endocardial border not optimally defined to evaluate regional wall motion. There is no left ventricular hypertrophy. Left ventricular diastolic parameters were normal. Right Ventricle: The right ventricular size is mildly enlarged. No increase in right ventricular wall thickness. Right ventricular systolic function is normal. There is normal pulmonary artery systolic pressure. The tricuspid regurgitant velocity is 2.16  m/s, and with an assumed right atrial pressure of 10 mmHg, the estimated right ventricular systolic pressure is 0000000 mmHg. Left Atrium: Left atrial size was normal in size. Right Atrium: Right atrial size was normal in size. Pericardium: There is no evidence of pericardial effusion. Mitral Valve: The mitral valve is normal in structure and function. Mild mitral valve regurgitation. Tricuspid Valve: The tricuspid valve is normal in structure. Tricuspid valve regurgitation is mild. Aortic Valve: The aortic valve is normal in structure and function. Aortic valve regurgitation is not visualized. Pulmonic Valve: The pulmonic valve was normal in structure. Pulmonic valve regurgitation is not visualized. Aorta: The aortic root and ascending aorta are structurally normal, with no evidence of dilitation. IAS/Shunts: No atrial level shunt detected by color flow Doppler.  LEFT VENTRICLE PLAX 2D LVIDd:         4.71 cm  Diastology LVIDs:         3.54 cm  LV e' lateral:   13.90 cm/s LV PW:         1.06 cm  LV E/e' lateral: 4.4 LV IVS:        1.19 cm  LV e' medial:    9.57 cm/s LVOT diam:     1.90 cm  LV E/e' medial:  6.4 LV SV Index:   27.21 LVOT Area:     2.84 cm  RIGHT VENTRICLE RV Mid diam:    3.59 cm RV S prime:     16.30 cm/s TAPSE (M-mode): 1.9 cm LEFT ATRIUM           Index       RIGHT ATRIUM           Index LA diam:      4.20 cm 2.26 cm/m  RA Area:     17.80 cm LA Vol (A2C): 67.5 ml 36.30 ml/m RA Volume:   46.00 ml  24.74 ml/m LA Vol (A4C): 40.8 ml 21.94  ml/m                        PULMONIC VALVE AORTA  PV Vmax:        0.58 m/s Ao Root diam: 3.70 cm PV Peak grad:   1.4 mmHg                       RVOT Peak grad: 1 mmHg  MITRAL VALVE               TRICUSPID VALVE MV Area (PHT): 4.36 cm    TR Peak grad:   18.7 mmHg MV Decel Time: 174 msec    TR Vmax:        216.00 cm/s MV E velocity: 61.60 cm/s MV A velocity: 48.00 cm/s  SHUNTS MV E/A ratio:  1.28        Systemic Diam: 1.90 cm Serafina Royals MD Electronically signed by Serafina Royals MD Signature Date/Time: 03/28/2019/4:26:59 PM    Final     Scheduled Meds:  atorvastatin  10 mg Oral Daily   chlorhexidine  15 mL Mouth/Throat BID   enoxaparin (LOVENOX) injection  40 mg Subcutaneous Q24H   escitalopram  5 mg Oral Daily   gabapentin  300 mg Oral TID   insulin aspart  0-9 Units Subcutaneous TID PC & HS   multivitamin with minerals  1 tablet Oral Daily   prazosin  2 mg Oral QHS   risperiDONE  3 mg Oral BID   tamsulosin  0.4 mg Oral Daily   traZODone  150 mg Oral QHS   Continuous Infusions:  dextrose 50 mL/hr at 03/29/19 0600   PRN Meds: clonazePAM, dextrose, senna-docusate  Time spent: 35 minutes  Author: Berle Mull, MD [facility] 03/29/2019 7:34 AM  To reach On-call, see care teams to locate the attending and reach out to them via [email or web address]. If 7PM-7AM, please contact night-coverage If you still have difficulty reaching the attending provider, please page the [facility] (Director on Call) for [facility] on amion for assistance.

## 2019-03-29 NOTE — Evaluation (Signed)
Clinical/Bedside Swallow Evaluation Patient Details  Name: Peter Parsons MRN: [id number] Date of Birth: 03-24-1960  Today's Date: 03/29/2019 Time: SLP Start Time (ACUTE ONLY): 0830 SLP Stop Time (ACUTE ONLY): 0930 SLP Time Calculation (min) (ACUTE ONLY): 60 min  Past Medical History:  Past Medical History:  Diagnosis Date  . Diabetes mellitus without complication ([place])   . Hypertension   . Schizophrenia [facility])    Past Surgical History: History reviewed. No pertinent surgical history. HPI:  Pt is 59 y/o M with PMH including: T2DM, HTN, and paranoid schizophrenia who had been on the BHU 44 days prior to this encounter. Pt was admitted to the [facility] from his [place] after wandering into the woods near water. There were reports of suicidal ideations. Pt presents d/t the Acute floor d/t generalized weakness, suspicion of dysphagia, and slurred speech. There is concern for Medicaion effect per MD report. MRI negative for acute CVA.     Assessment / Plan / Recommendation Clinical Impression  Pt appears to present w/ grossly adequate oropharyngeal phase swallowing function w/ No overt clinical s/s of aspiration noted during oral intake at this evaluation. Pt is at reduced risk for aspiration when following general aspiration precautions. However, pt does exhibit Min slower oral phase bolus management and clearing w/ increased texture intermittently -- suspect directly related to his Psychiatric baseline and Meds(as per MD note/discussion). Pt's overall motor and speech responses are Min slower and soft; he is subdued in presentation and engagement is not volitional but he will answer basic questions re: self when asked. During po trials, pt consumed trials of thin liquids, purees, and soft solids w/ no overt s/s of aspiration noted; no decline in vocal quality or respiratory status during/post trials. Oral phase was grossly [facility] initially w/ Timely oral management and A-P transfer and oral clearing w/  liquids and purees; min slower oral clearing w/ solid foods as pt became "Full" during the breakfast meal ordered by SLP. Pt and Sitter encouraged to use lingual sweeping and alternative foods/liquids to aid oral clearing. Pt fed self w/ setup. OM exam appeared [facility] w/ no unilateral weakness noted. Oral Care required b/f exam d/t dried secretions.  Recommend a Mech Soft diet w/ thin liquids; general aspiration precautions; tray setup and support at meals for follow through w/ precautions and oral clearing as needed. Recommend reduce distractions during meals. Recommend Pills in Puree w/ NSG for safer swallowing. ST services can be available for further needs if arise during this admission.  SLP Visit Diagnosis: Dysphagia, oral phase (R13.11)(slight-min suspect d/t mental status)    Aspiration Risk  Mild aspiration risk;Risk for inadequate nutrition/hydration(Reduced following precautions)    Diet Recommendation  Mech Soft diet w/ thin liquids; General aspiration precautions; Support at meals w/ reducing distractions and Tray setup. Check for oral clearing at end of meals.  Medication Administration: Whole meds with puree(vs need to Crush in Puree)    Other  Recommendations Recommended Consults: (Dietician f/u) Oral Care Recommendations: Oral care BID;Oral care before and after PO;Staff/trained caregiver to provide oral care Other Recommendations: (n/a)   Follow up Recommendations None      Frequency and Duration (n/a)  (n/a)       Prognosis Prognosis for Safe Diet Advancement: Fair(-Good) Barriers to Reach Goals: Time post onset;Severity of deficits;Behavior;Medication;Cognitive deficits      Swallow Study   General Date of Onset: 01/25/19 HPI: Pt is 59 y/o M with PMH including: T2DM, HTN, and paranoid schizophrenia who had been on the  BHU 44 days prior to this encounter. Pt was admitted to the [facility] from his [place] after wandering into the woods near water. There were reports of  suicidal ideations. Pt presents d/t the Acute floor d/t generalized weakness, suspicion of dysphagia, and slurred speech. There is concern for Medicaion effect per MD report. MRI negative for acute CVA.   Type of Study: Bedside Swallow Evaluation Previous Swallow Assessment: none Diet Prior to this Study: NPO(regular diet prior) Temperature Spikes Noted: No(wbc 4.3) Respiratory Status: Room air History of Recent Intubation: No Behavior/Cognition: Alert;Cooperative;Pleasant mood;Confused;Distractible;Requires cueing(baseline Schizophrenia) Oral Cavity Assessment: Excessive secretions;Dried secretions Oral Care Completed by SLP: Yes Oral Cavity - Dentition: Adequate natural dentition Vision: Functional for self-feeding Self-Feeding Abilities: Able to feed self;Needs assist;Needs set up Patient Positioning: Upright in chair Baseline Vocal Quality: Low vocal intensity(adequate) Volitional Cough: Cognitively unable to elicit Volitional Swallow: Unable to elicit    Oral/Motor/Sensory Function Overall Oral Motor/Sensory Function: Within functional limits   Ice Chips Ice chips: Within functional limits Presentation: Spoon(fed; 3 trials)   Thin Liquid Thin Liquid: Within functional limits Presentation: Cup;Self Fed;Straw(~12 ozs) Other Comments: water, juice    Nectar Thick Nectar Thick Liquid: Within functional limits Presentation: Self Fed;Straw(~4 ozs) Other Comments: nepro   Honey Thick Honey Thick Liquid: Not tested   Puree Puree: Within functional limits Presentation: Self Fed;Spoon(~3 ozs)   Solid     Solid: Impaired Presentation: Self Fed(5 trials) Oral Phase Impairments: (slow oral phase attention - min) Oral Phase Functional Implications: Prolonged oral transit(min) Pharyngeal Phase Impairments: (none)       Orinda Kenner, MS, CCC-SLP Yvette Roark 03/29/2019,11:27 AM

## 2019-03-29 NOTE — Consult Note (Addendum)
Reason for Consult:Weakness Requesting Physician: Posey Pronto  CC: Weakness, slurred speech  I have been asked by Dr. Posey Pronto to see this patient in consultation for weakness, difficulty swallowing and dysarthria.  HPI: Peter Parsons is an 59 y.o. male with a history of HTN, DM and schizophrenia who was in the [facility] for [facility] days.  Presented with acute worsening of generalized weakness, dysphagia, slurred speech and left facial droop.  Was admitted to rule out CVA.  MRI of the brain personally reviewed and shows no acute changes.  Carotid dopplers show no evidence of hemodynamically significant stenosis.  Echocardiogram shows no cardiac source of emboli with an EF of 50-55%.  Patient is able to eat but remains weak.  He reports that prior to coming to the [facility] he was able to walk without an assistive device.  He admits to being weaker for the past month or so and now having to use a walker.  Also reports loose bowel movements and fecal incontinence.    Past Medical History:  Diagnosis Date  . Diabetes mellitus without complication ([place])   . Hypertension   . Schizophrenia ([place])     History reviewed. No pertinent surgical history.  Family history: Sister with BPD.  Mother with lung cancer.  Social History:  reports that he has quit smoking. He has never used smokeless tobacco. He reports previous alcohol use. He reports previous drug use.  Allergies  Allergen Reactions  . Acetaminophen Other (See Comments)    Unknown Unable to breathe Chest  tightness/SOB     Medications:  I have reviewed the patient's current medications. Prior to Admission:  Medications Prior to Admission  Medication Sig Dispense Refill Last Dose  . atorvastatin (LIPITOR) 10 MG tablet Take 10 mg by mouth daily.   02/10/2019 at Unknown time  . gabapentin (NEURONTIN) 300 MG capsule Take 300 mg by mouth 3 (three) times daily.   02/10/2019 at Unknown time  . lisinopril (ZESTRIL) 20 MG tablet Take 1 tablet (20 mg total) by mouth  daily. 30 tablet 0 02/10/2019 at Unknown time  . metFORMIN (GLUCOPHAGE) 500 MG tablet Take 1 tablet (500 mg total) by mouth 2 (two) times daily with a meal. 30 tablet 0 02/10/2019 at Unknown time  . paliperidone (INVEGA SUSTENNA) 234 MG/1.5ML SUSY injection Inject 234 mg into the muscle every 28 (twenty-eight) days. 1.8 mL 0 Past Month at Unknown time  . risperiDONE (RISPERDAL) 3 MG tablet Take 1 tablet (3 mg total) by mouth 2 (two) times daily. 45 tablet 0 02/10/2019 at Unknown time  . tamsulosin (FLOMAX) 0.4 MG CAPS capsule Take 0.4 mg by mouth daily.   02/10/2019 at Unknown time  . traZODone (DESYREL) 150 MG tablet Take 150 mg by mouth at bedtime.    02/10/2019 at Unknown time   Scheduled: . atorvastatin  10 mg Oral Daily  . chlorhexidine  15 mL Mouth/Throat BID  . enoxaparin (LOVENOX) injection  40 mg Subcutaneous Q24H  . [START ON 03/30/2019] escitalopram  10 mg Oral Daily  . gabapentin  300 mg Oral TID  . insulin aspart  0-9 Units Subcutaneous TID PC & HS  . multivitamin with minerals  1 tablet Oral Daily  . prazosin  2 mg Oral QHS  . risperiDONE  3 mg Oral BID  . tamsulosin  0.4 mg Oral Daily  . traZODone  150 mg Oral QHS    ROS: History obtained from the patient  General ROS: negative for - chills, fatigue, fever, night sweats, weight  gain or weight loss Psychological ROS: memory difficulties Ophthalmic ROS: negative for - blurry vision, double vision, eye pain or loss of vision ENT ROS: negative for - epistaxis, nasal discharge, oral lesions, sore throat, tinnitus or vertigo Allergy and Immunology ROS: negative for - hives or itchy/watery eyes Hematological and Lymphatic ROS: negative for - bleeding problems, bruising or swollen lymph nodes Endocrine ROS: negative for - galactorrhea, hair pattern changes, polydipsia/polyuria or temperature intolerance Respiratory ROS: negative for - cough, hemoptysis, shortness of breath or wheezing Cardiovascular ROS: negative for - chest  pain, dyspnea on exertion, edema or irregular heartbeat Gastrointestinal ROS: as noted in HPI Genito-Urinary ROS: negative for - dysuria, hematuria, incontinence or urinary frequency/urgency Musculoskeletal ROS: negative for - joint swelling or muscular weakness Neurological ROS: as noted in HPI Dermatological ROS: negative for rash and skin lesion changes  Physical Examination: Blood pressure 119/67, pulse 95, temperature 98.3 F (36.8 C), temperature source Oral, resp. rate 17, height 5' 9"  (1.753 m), weight 70.8 kg, SpO2 98 %.  HEENT-  Normocephalic, no lesions, without obvious abnormality.  Normal external eye and conjunctiva.  Normal TM's bilaterally.  Normal auditory canals and external ears. Normal external nose, mucus membranes and septum.  Normal pharynx. Cardiovascular- S1, S2 normal, pulses palpable throughout   Lungs- chest clear, no wheezing, rales, normal symmetric air entry Abdomen- soft, non-tender; bowel sounds normal; no masses,  no organomegaly Extremities- no edema Lymph-no adenopathy palpable Musculoskeletal-no joint tenderness, deformity or swelling Skin-warm and dry, no hyperpigmentation, vitiligo, or suspicious lesions  Neurological Examination   Mental Status: Alert, thought content appropriate.  Speech fluent without evidence of aphasia.  Mild dysarthria noted.  Able to follow 3 step commands without difficulty. Cranial Nerves: II: Visual fields grossly normal, pupils equal, round, reactive to light and accommodation III,IV, VI: ptosis not present, extra-ocular motions intact bilaterally V,VII: mild right facial droop, facial light touch sensation normal bilaterally VIII: hearing normal bilaterally IX,X: gag reflex present XI: bilateral shoulder shrug XII: midline tongue extension Motor: Generalized weakness throughout but able to lift all extremities against gravity.  No focality noted.  Evidence of atrophy noted in all extremities Sensory: Pinprick and  light touch intact throughout, bilaterally Deep Tendon Reflexes: Symmetric throughout Plantars: Right: mute   Left: mute Cerebellar: Normal finger-to-nose and normal heel-to-shin testing bilaterally Gait: not tested due to safety concerns  Laboratory Studies:   Basic Metabolic Panel: Recent Labs  Lab 03/27/19 2152 03/29/19 0757  NA 142 139  K 4.5 4.0  CL 102 102  CO2 29 28  GLUCOSE 93 89  BUN 30* 24*  CREATININE 1.86* 1.28*  CALCIUM 9.4 9.0    Liver Function Tests: Recent Labs  Lab 03/27/19 2152  AST 21  ALT 13  ALKPHOS 59  BILITOT 0.8  PROT 7.5  ALBUMIN 4.4   No results for input(s): LIPASE, AMYLASE in the last 168 hours. No results for input(s): AMMONIA in the last 168 hours.  CBC: Recent Labs  Lab 03/27/19 2152 03/29/19 0757  WBC 5.4 4.3  NEUTROABS 3.5  --   HGB 11.3* 10.2*  HCT 31.9* 28.9*  MCV 83.9 85.8  PLT 147* 131*    Cardiac Enzymes: No results for input(s): CKTOTAL, CKMB, CKMBINDEX, TROPONINI in the last 168 hours.  BNP: Invalid input(s): POCBNP  CBG: Recent Labs  Lab 03/28/19 1733 03/28/19 1822 03/28/19 2309 03/29/19 0934 03/29/19 1206  GLUCAP 71 69* 82 132* 135*    Microbiology: Results for orders placed or performed during the hospital  encounter of 02/11/19  Respiratory Panel by RT PCR (Flu A&B, Covid) - Nasopharyngeal Swab     Status: None   Collection Time: 02/11/19  4:40 PM   Specimen: Nasopharyngeal Swab  Result Value Ref Range Status   SARS Coronavirus 2 by RT PCR NEGATIVE NEGATIVE Final    Comment: (NOTE) SARS-CoV-2 target nucleic acids are NOT DETECTED. The SARS-CoV-2 RNA is generally detectable in upper respiratoy specimens during the acute phase of infection. The lowest concentration of SARS-CoV-2 viral copies this assay can detect is 131 copies/mL. A negative result does not preclude SARS-Cov-2 infection and should not be used as the sole basis for treatment or other patient management decisions. A negative  result may occur with  improper specimen collection/handling, submission of specimen other than nasopharyngeal swab, presence of viral mutation(s) within the areas targeted by this assay, and inadequate number of viral copies (<131 copies/mL). A negative result must be combined with clinical observations, patient history, and epidemiological information. The expected result is Negative. Fact Sheet for Patients:  PinkCheek.be Fact Sheet for Healthcare Providers:  GravelBags.it This test is not yet ap proved or cleared by the [place] FDA and  has been authorized for detection and/or diagnosis of SARS-CoV-2 by FDA under an Emergency Use Authorization (EUA). This EUA will remain  in effect (meaning this test can be used) for the duration of the COVID-19 declaration under Section 564(b)(1) of the Act, 21 U.S.C. section 360bbb-3(b)(1), unless the authorization is terminated or revoked sooner.    Influenza A by PCR NEGATIVE NEGATIVE Final   Influenza B by PCR NEGATIVE NEGATIVE Final    Comment: (NOTE) The Xpert Xpress SARS-CoV-2/FLU/RSV assay is intended as an aid in  the diagnosis of influenza from Nasopharyngeal swab specimens and  should not be used as a sole basis for treatment. Nasal washings and  aspirates are unacceptable for Xpert Xpress SARS-CoV-2/FLU/RSV  testing. Fact Sheet for Patients: PinkCheek.be Fact Sheet for Healthcare Providers: GravelBags.it This test is not yet approved or cleared by the [place] FDA and  has been authorized for detection and/or diagnosis of SARS-CoV-2 by  FDA under an Emergency Use Authorization (EUA). This EUA will remain  in effect (meaning this test can be used) for the duration of the  Covid-19 declaration under Section 564(b)(1) of the Act, 21  U.S.C. section 360bbb-3(b)(1), unless the authorization is  terminated or  revoked. Performed at [facility], [place], [place] [address]   SARS CORONAVIRUS 2 (TAT 6-24 HRS) Nasopharyngeal Nasopharyngeal Swab     Status: None   Collection Time: 02/18/19  2:52 PM   Specimen: Nasopharyngeal Swab  Result Value Ref Range Status   SARS Coronavirus 2 NEGATIVE NEGATIVE Final    Comment: (NOTE) SARS-CoV-2 target nucleic acids are NOT DETECTED. The SARS-CoV-2 RNA is generally detectable in upper and lower respiratory specimens during the acute phase of infection. Negative results do not preclude SARS-CoV-2 infection, do not rule out co-infections with other pathogens, and should not be used as the sole basis for treatment or other patient management decisions. Negative results must be combined with clinical observations, patient history, and epidemiological information. The expected result is Negative. Fact Sheet for Patients: SugarRoll.be Fact Sheet for Healthcare Providers: https://www.woods-mathews.com/ This test is not yet approved or cleared by the [place] FDA and  has been authorized for detection and/or diagnosis of SARS-CoV-2 by FDA under an Emergency Use Authorization (EUA). This EUA will remain  in effect (meaning this test can  be used) for the duration of the COVID-19 declaration under Section 56 4(b)(1) of the Act, 21 U.S.C. section 360bbb-3(b)(1), unless the authorization is terminated or revoked sooner. Performed at [facility], [place] [address], [place], [place] [address]   SARS CORONAVIRUS 2 (TAT 6-24 HRS) Nasopharyngeal Nasopharyngeal Swab     Status: None   Collection Time: 02/22/19  2:54 PM   Specimen: Nasopharyngeal Swab  Result Value Ref Range Status   SARS Coronavirus 2 NEGATIVE NEGATIVE Final    Comment: (NOTE) SARS-CoV-2 target nucleic acids are NOT DETECTED. The SARS-CoV-2 RNA is generally detectable in upper and lower respiratory specimens during the acute  phase of infection. Negative results do not preclude SARS-CoV-2 infection, do not rule out co-infections with other pathogens, and should not be used as the sole basis for treatment or other patient management decisions. Negative results must be combined with clinical observations, patient history, and epidemiological information. The expected result is Negative. Fact Sheet for Patients: SugarRoll.be Fact Sheet for Healthcare Providers: https://www.woods-mathews.com/ This test is not yet approved or cleared by the [place] FDA and  has been authorized for detection and/or diagnosis of SARS-CoV-2 by FDA under an Emergency Use Authorization (EUA). This EUA will remain  in effect (meaning this test can be used) for the duration of the COVID-19 declaration under Section 56 4(b)(1) of the Act, 21 U.S.C. section 360bbb-3(b)(1), unless the authorization is terminated or revoked sooner. Performed at [facility], [place] [address] [place], [place] [address]   SARS CORONAVIRUS 2 (TAT 6-24 HRS) Nasopharyngeal Nasopharyngeal Swab     Status: None   Collection Time: 02/25/19  1:16 PM   Specimen: Nasopharyngeal Swab  Result Value Ref Range Status   SARS Coronavirus 2 NEGATIVE NEGATIVE Final    Comment: (NOTE) SARS-CoV-2 target nucleic acids are NOT DETECTED. The SARS-CoV-2 RNA is generally detectable in upper and lower respiratory specimens during the acute phase of infection. Negative results do not preclude SARS-CoV-2 infection, do not rule out co-infections with other pathogens, and should not be used as the sole basis for treatment or other patient management decisions. Negative results must be combined with clinical observations, patient history, and epidemiological information. The expected result is Negative. Fact Sheet for Patients: SugarRoll.be Fact Sheet for Healthcare  Providers: https://www.woods-mathews.com/ This test is not yet approved or cleared by the [place] FDA and  has been authorized for detection and/or diagnosis of SARS-CoV-2 by FDA under an Emergency Use Authorization (EUA). This EUA will remain  in effect (meaning this test can be used) for the duration of the COVID-19 declaration under Section 56 4(b)(1) of the Act, 21 U.S.C. section 360bbb-3(b)(1), unless the authorization is terminated or revoked sooner. Performed at [facility], [place] [address] [place], [place] [address]     Coagulation Studies: No results for input(s): LABPROT, INR in the last 72 hours.  Urinalysis:  Recent Labs  Lab 03/27/19 2153  COLORURINE YELLOW*  LABSPEC 1.015  PHURINE 5.0  GLUCOSEU NEGATIVE  HGBUR MODERATE*  BILIRUBINUR NEGATIVE  KETONESUR 5*  PROTEINUR NEGATIVE  NITRITE NEGATIVE  LEUKOCYTESUR NEGATIVE    Lipid Panel:     Component Value Date/Time   CHOL 57 03/28/2019 0328   TRIG 77 03/28/2019 0328   HDL 21 (L) 03/28/2019 0328   CHOLHDL 2.7 03/28/2019 0328   VLDL 15 03/28/2019 0328   LDLCALC 21 03/28/2019 0328    HgbA1C:  Lab Results  Component Value Date   HGBA1C 4.5 (L)  03/28/2019    Urine Drug Screen:      Component Value Date/Time   LABOPIA NONE DETECTED 02/11/2019 1445   COCAINSCRNUR NONE DETECTED 02/11/2019 1445   LABBENZ NONE DETECTED 02/11/2019 1445   AMPHETMU NONE DETECTED 02/11/2019 1445   THCU NONE DETECTED 02/11/2019 1445   LABBARB NONE DETECTED 02/11/2019 1445    Alcohol Level: No results for input(s): ETH in the last 168 hours.  Other results: EKG: sinus rhythm at 97 bpm.  Imaging: CT Head Wo Contrast  Result Date: 03/27/2019 CLINICAL DATA:  Focal neuro deficit for greater than 6 hours, lethargic, not answering questions EXAM: CT HEAD WITHOUT CONTRAST TECHNIQUE: Contiguous axial images were obtained from the base of the skull through the vertex without intravenous contrast. COMPARISON:   CT head 02/11/2019 FINDINGS: Brain: No evidence of acute infarction, hemorrhage, hydrocephalus, extra-axial collection or mass lesion/mass effect. Vascular: Atherosclerotic calcification of the carotid siphons and intradural vertebral arteries. No hyperdense vessel. Skull: No calvarial fracture or suspicious osseous lesion. Mild right temporal scalp swelling without large hematoma. Sinuses/Orbits: Paranasal sinuses and mastoid air cells are predominantly clear. Included orbital structures are unremarkable. Other: None IMPRESSION: No acute intracranial findings. Mild right temporal scalp swelling, nonspecific. Electronically Signed   By: Lovena Le M.D.   On: 03/27/2019 22:25   MR BRAIN WO CONTRAST  Result Date: 03/28/2019 CLINICAL DATA:  Diabetes and hypertension. Generalized weakness. Lethargy. EXAM: MRI HEAD WITHOUT CONTRAST TECHNIQUE: Multiplanar, multiecho pulse sequences of the brain and surrounding structures were obtained without intravenous contrast. COMPARISON:  CT 03/27/2019 FINDINGS: Brain: The brain has a normal appearance without evidence of malformation, atrophy, old or acute small or large vessel infarction, mass lesion, hemorrhage, hydrocephalus or extra-axial collection. Vascular: Major vessels at the base of the brain show flow. Venous sinuses appear patent. Skull and upper cervical spine: Normal. Sinuses/Orbits: Clear/normal. Other: None significant. IMPRESSION: Normal examination. Electronically Signed   By: Nelson Chimes M.D.   On: 03/28/2019 01:42   US Carotid Bilateral (at [facility] and [facility] only)  Result Date: 03/28/2019 CLINICAL DATA:  TIA, left facial droop, weakness, dysphagia, hypertension and diabetes. EXAM: BILATERAL CAROTID DUPLEX ULTRASOUND TECHNIQUE: Pearline Cables scale imaging, color Doppler and duplex ultrasound were performed of bilateral carotid and vertebral arteries in the neck. COMPARISON:  None. FINDINGS: Criteria: Quantification of carotid stenosis is based on velocity parameters  that correlate the residual internal carotid diameter with NASCET-based stenosis levels, using the diameter of the distal internal carotid lumen as the denominator for stenosis measurement. The following velocity measurements were obtained: RIGHT ICA:  72/25 cm/sec CCA:  412/87 cm/sec SYSTOLIC ICA/CCA RATIO:  0.7 ECA:  87 cm/sec LEFT ICA:  94/25 cm/sec CCA:  86/76 cm/sec SYSTOLIC ICA/CCA RATIO:  1.0 ECA:  127 cm/sec RIGHT CAROTID ARTERY: The common carotid artery and carotid bulb demonstrate mild intimal thickening. No focal plaque identified. No evidence of right-sided carotid stenosis. RIGHT VERTEBRAL ARTERY: Antegrade flow with normal waveform and velocity. LEFT CAROTID ARTERY: The common carotid artery demonstrates intimal thickening. There may be trace plaque in the carotid bulb. No evidence of left ICA plaque or stenosis. LEFT VERTEBRAL ARTERY: Antegrade flow with normal waveform and velocity. IMPRESSION: Trace plaque in the left carotid bulb. No evidence of bilateral internal carotid artery plaque or stenosis. Electronically Signed   By: Aletta Edouard M.D.   On: 03/28/2019 15:55   DG Chest Portable 1 View  Result Date: 03/27/2019 CLINICAL DATA:  Difficulty swallowing EXAM: PORTABLE CHEST 1 VIEW COMPARISON:  03/20/2019 FINDINGS: The  heart size and mediastinal contours are within normal limits. Both lungs are clear. The visualized skeletal structures are unremarkable. IMPRESSION: No active disease. Electronically Signed   By: Donavan Foil M.D.   On: 03/27/2019 22:06   ECHOCARDIOGRAM COMPLETE  Result Date: 03/28/2019    ECHOCARDIOGRAM REPORT   Patient Name:   Peter Parsons Date of Exam: 03/28/2019 Medical Rec #:  [id number]    Height:       69.0 in Accession #:    [id number]   Weight:       156.1 lb Date of Birth:  December 09, 1960    BSA:          1.86 m Patient Age:    61 years     BP:           116/84 mmHg Patient Gender: M            HR:           66 bpm. Exam Location:  [facility] Procedure: 2D Echo, Cardiac  Doppler and Color Doppler Indications:     TIA 435.9  History:         Patient has no prior history of Echocardiogram examinations.                  Risk Factors:Hypertension and Diabetes.  Sonographer:     Alyse Low Roar Referring Phys:  [id number] [place] Diagnosing Phys: Serafina Royals MD  Sonographer Comments: Technically difficult study due to poor echo windows. IMPRESSIONS  1. Left ventricular ejection fraction, by estimation, is 50 to 55%. The left ventricle has low normal function. LV endocardial border not optimally defined to evaluate regional wall motion. Left ventricular diastolic parameters were normal.  2. Right ventricular systolic function is normal. The right ventricular size is mildly enlarged. There is normal pulmonary artery systolic pressure.  3. The mitral valve is normal in structure and function. Mild mitral valve regurgitation.  4. The aortic valve is normal in structure and function. Aortic valve regurgitation is not visualized. FINDINGS  Left Ventricle: Left ventricular ejection fraction, by estimation, is 50 to 55%. The left ventricle has low normal function. LV endocardial border not optimally defined to evaluate regional wall motion. There is no left ventricular hypertrophy. Left ventricular diastolic parameters were normal. Right Ventricle: The right ventricular size is mildly enlarged. No increase in right ventricular wall thickness. Right ventricular systolic function is normal. There is normal pulmonary artery systolic pressure. The tricuspid regurgitant velocity is 2.16  m/s, and with an assumed right atrial pressure of 10 mmHg, the estimated right ventricular systolic pressure is 30.8 mmHg. Left Atrium: Left atrial size was normal in size. Right Atrium: Right atrial size was normal in size. Pericardium: There is no evidence of pericardial effusion. Mitral Valve: The mitral valve is normal in structure and function. Mild mitral valve regurgitation. Tricuspid Valve: The tricuspid  valve is normal in structure. Tricuspid valve regurgitation is mild. Aortic Valve: The aortic valve is normal in structure and function. Aortic valve regurgitation is not visualized. Pulmonic Valve: The pulmonic valve was normal in structure. Pulmonic valve regurgitation is not visualized. Aorta: The aortic root and ascending aorta are structurally normal, with no evidence of dilitation. IAS/Shunts: No atrial level shunt detected by color flow Doppler.  LEFT VENTRICLE PLAX 2D LVIDd:         4.71 cm  Diastology LVIDs:         3.54 cm  LV e' lateral:   13.90 cm/s LV PW:  1.06 cm  LV E/e' lateral: 4.4 LV IVS:        1.19 cm  LV e' medial:    9.57 cm/s LVOT diam:     1.90 cm  LV E/e' medial:  6.4 LV SV Index:   27.21 LVOT Area:     2.84 cm  RIGHT VENTRICLE RV Mid diam:    3.59 cm RV S prime:     16.30 cm/s TAPSE (M-mode): 1.9 cm LEFT ATRIUM           Index       RIGHT ATRIUM           Index LA diam:      4.20 cm 2.26 cm/m  RA Area:     17.80 cm LA Vol (A2C): 67.5 ml 36.30 ml/m RA Volume:   46.00 ml  24.74 ml/m LA Vol (A4C): 40.8 ml 21.94 ml/m                        PULMONIC VALVE AORTA                 PV Vmax:        0.58 m/s Ao Root diam: 3.70 cm PV Peak grad:   1.4 mmHg                       RVOT Peak grad: 1 mmHg  MITRAL VALVE               TRICUSPID VALVE MV Area (PHT): 4.36 cm    TR Peak grad:   18.7 mmHg MV Decel Time: 174 msec    TR Vmax:        216.00 cm/s MV E velocity: 61.60 cm/s MV A velocity: 48.00 cm/s  SHUNTS MV E/A ratio:  1.28        Systemic Diam: 1.90 cm Serafina Royals MD Electronically signed by Serafina Royals MD Signature Date/Time: 03/28/2019/4:26:59 PM    Final      Assessment/Plan: 59 y.o. male with a history of HTN, DM and schizophrenia who was in the [facility] days.  Presented with acute worsening of generalized weakness, dysphagia, slurred speech and left facial droop.  Was admitted to rule out CVA.  MRI of the brain personally reviewed and shows no acute changes.  Carotid  dopplers show no evidence of hemodynamically significant stenosis.  Echocardiogram shows no cardiac source of emboli with an EF of 50-55%.  With negative MRI of the brain and nonfocal neurological examination, ischemic etiology of symptoms is unlikely.   Patient is able to eat but remains weak.  He reports that prior to coming to the [facility] he was able to walk without an assistive device.  He admits to being weaker for the past month or so and now having to use a walker.  Also reports loose bowel movements and fecal incontinence.   Although many of his symptoms can be seen as a consequence of his psychiatric medications will rule out other possible etiologies as well to include cervical spinal stenosis, myopathies, etc.   TSH and folate are normal.  B12 low.    Recommendations: 1. ESR, CK, thiamine and RPR 2. B12 supplementation 3. MRI of the cervical spine without contrast to rule out cervical spinal stenosis 4. Would have psych re-eval medications and need for discontinuation, particularly Invega (although injectable)  and its possible interactions with other medications.     Alexis Goodell, MD Neurology  [phone] 03/29/2019, 12:14 PM

## 2019-03-29 NOTE — Evaluation (Signed)
Occupational Therapy Evaluation Patient Details Name: Peter Parsons MRN: [id number] DOB: 08/23/1960 Today's Date: 03/29/2019    History of Present Illness Pt is 59 y/o M with PMH including: T2DM, HTN, and schizophrenia who had been on the [facility] 44 days prior to this encounter. Pt presents d/t generalized weakness, dysphagia, and slurred speech. MRI negative for acute CVA.   Clinical Impression   Pt was seen for OT evaluation this date. Prior to hospital admission, pt reports being Indep. Pt lives with parents, but limited other home setup/PLOF able to be gleaned from pt at this time d/t some difficulty with attention notable on evaluation. Currently pt demonstrates impairments as described below (See OT problem list) which functionally limit his ability to perform ADL/self-care tasks. Pt currently requires MIN A and RW for ADL transfers/mobility and CGA for seated dynamic LB ADLs.  Pt would benefit from skilled OT to address noted impairments and functional limitations (see below for any additional details) in order to maximize safety and independence while minimizing falls risk and caregiver burden. Upon hospital discharge, recommend HHOT and supv for fxl mobility to maximize pt safety and return to functional independence during meaningful occupations of daily life.      Follow Up Recommendations  Home health OT;Supervision - Intermittent(supv for OOB/fxl mobility at least upon initial d/c.)    Equipment Recommendations  3 in 1 bedside commode;Other (comment)(2WW for fxl mobility)    Recommendations for Other Services       Precautions / Restrictions Precautions Precautions: Fall Precaution Comments: Suicide precautions; IVC Restrictions Weight Bearing Restrictions: No      Mobility Bed Mobility Overal bed mobility: Needs Assistance Bed Mobility: Supine to Sit     Supine to sit: Min assist        Transfers Overall transfer level: Needs assistance Equipment used: Rolling  walker (2 wheeled) Transfers: Sit to/from Stand Sit to Stand: Min assist         General transfer comment: extended time, poor balance on first come to stand, pt with posterior lean and goes back into sitting EOB without controlling descent, appearing to have lost balance. On second attempt, pt able to tolerate better and demos somewhat increased stability.    Balance Overall balance assessment: Needs assistance Sitting-balance support: No upper extremity supported;Feet supported Sitting balance-Leahy Scale: Good   Postural control: Posterior lean Standing balance support: Bilateral upper extremity supported Standing balance-Leahy Scale: Fair Standing balance comment: Pt requires RW and MIN A to maintain static stand.                           ADL either performed or assessed with clinical judgement   ADL                                         General ADL Comments: Pt able to perform seated UB ADLs with setup, requires SBA to CGA for more dyanmic reaching/LB ADLs while seated d/t POOR dynamic sitting balance. Demos POOR static standing balance requiring MIN A with RW to maintain static stand for standing ADLs like sink-side grooming. MIN verbal/tactile cues for safety throughout as well as pt demos some impulsivity.     Vision   Additional Comments: difficult to formally assess, pt with only moderate visual attention throughout, potentially related to medication or fatigue. Does appear to track appropriately when pt is attending.  Perception     Praxis      Pertinent Vitals/Pain Pain Assessment: No/denies pain(states he is not in pain now, but "will be" when OT asks pt to describe where or what causes pain, pt does not clarify.)     Hand Dominance     Extremity/Trunk Assessment Upper Extremity Assessment Upper Extremity Assessment: Generalized weakness   Lower Extremity Assessment Lower Extremity Assessment: Generalized weakness    Cervical / Trunk Assessment Cervical / Trunk Assessment: Normal   Communication Communication Communication: Other (comment)(some slurred/slow speech)   Cognition Arousal/Alertness: Awake/alert Behavior During Therapy: Flat affect Overall Cognitive Status: Difficult to assess                                 General Comments: pt is somewhat oriented to place and time, but not full situation (knows he is weak, does not recognize that he was worked up for stroke). His speech is slow and he demos limited eye contact/engagement. Pt demos impulsivity, attempting to stand before OT is ready to assist despite pt having poor balance. OT attempts to provide safety education, but only minimal reception detected. Pt does, however, appropriately follow commands.   General Comments       Exercises Other Exercises Other Exercises: OT facilitates pt education re: safety recommendations/fall prevention in acute setting including use of call light, asking sitter for assistance, and notifying pt of chair alarm present for fall prevention. Pt agreeable that his balance is not so good at the moment. Other Exercises: OT facilitates education re: need for f/u therapy after d/c to ensure safe layout/home setup for fall prevention as well as further education on task modification for fall prevention. Pt agreeable.   Shoulder Instructions      Home Living Family/patient expects to be discharged to:: Unsure(presented from group home, but apprently cannot return d/t safety concerns. Pt reports now living with his parents in private residence, unsure of efficacy of this statement.) Living Arrangements: Parent Available Help at Discharge: Family;Available PRN/intermittently                             Additional Comments: pt with difficulty describing home setup/PLOF. Does endorse being Indep with self care. Does respond "no" when asked if RW used.      Prior Functioning/Environment  Level of Independence: Independent        Comments: Ambulatory no AD        OT Problem List: Decreased strength;Decreased activity tolerance;Impaired balance (sitting and/or standing);Decreased safety awareness;Decreased knowledge of use of DME or AE;Decreased knowledge of precautions;Decreased cognition      OT Treatment/Interventions: Self-care/ADL training;Therapeutic exercise;DME and/or AE instruction;Therapeutic activities;Patient/family education;Balance training    OT Goals(Current goals can be found in the care plan section) Acute Rehab OT Goals Patient Stated Goal: to improve strength/balance OT Goal Formulation: With patient Time For Goal Achievement: 04/12/19 Potential to Achieve Goals: Good  OT Frequency: Min 2X/week   Barriers to D/C:            Co-evaluation              AM-PAC OT "6 Clicks" Daily Activity     Outcome Measure Help from another person eating meals?: None Help from another person taking care of personal grooming?: A Little Help from another person toileting, which includes using toliet, bedpan, or urinal?: A Little Help from another person bathing (including  washing, rinsing, drying)?: A Little Help from another person to put on and taking off regular upper body clothing?: A Little Help from another person to put on and taking off regular lower body clothing?: A Little 6 Click Score: 19   End of Session Equipment Utilized During Treatment: Gait belt;Rolling walker  Activity Tolerance: Patient tolerated treatment well Patient left: with call bell/phone within reach;in chair;with chair alarm set;with nursing/sitter in room(sitter present throughout and SLP presenting for assessment/treatment)  OT Visit Diagnosis: Unsteadiness on feet (R26.81);Muscle weakness (generalized) (M62.81)                Time: [id number] OT Time Calculation (min): 18 min Charges:  OT General Charges $OT Visit: 1 Visit OT Evaluation $OT Eval Low Complexity: 1  Low OT Treatments $Self Care/Home Management : 8-22 mins  Gerrianne Scale, MS, OTR/L ascom [phone] 03/29/19, 10:07 AM

## 2019-03-29 NOTE — Progress Notes (Signed)
Initial Nutrition Assessment  DOCUMENTATION CODES:   Severe malnutrition in context of social or environmental circumstances  INTERVENTION:  Provide Nepro Shake po TID, each supplement provides 425 kcal and 19 grams protein. Patient prefers butter pecan.  Continue daily MVI.  Recommend vitamin B12 1000 micrograms po daily. Recommend rechecking vitamin B12 level in one month. If level is not WNL after one month of PO supplementation consider providing vitamin B12 parenterally.  NUTRITION DIAGNOSIS:   Severe Malnutrition related to social / environmental circumstances(inadequate oral intake) as evidenced by 21.6% weight loss over the past 2-3 months, moderate fat depletion, moderate-severe muscle depletion.  GOAL:   Patient will meet greater than or equal to 90% of their needs  MONITOR:   PO intake, Supplement acceptance, Labs, Weight trends, I & O's  REASON FOR ASSESSMENT:   Malnutrition Screening Tool    ASSESSMENT:   59 year old male with PMHx of schizophrenia, HTN, DM who has been in [place] for 44 days and is now admitted with generalized weakness, dysphagia, slurred speech, left facial droop to rule out CVA.   -Patient was assessed by SLP and placed on dysphagia 3 diet with thin liquids.  Met with patient at bedside. Safety sitter was present in room. Patient reports he has had a decreased appetite and intake. He is unable to describe exactly how much he has been eating at meals. It appears early on in his admission at [facility] he was eating 100% of meals. It then decreased to around 50%. Patient only ate 25% of his breakfast today. He did drink some butter pecan Nepro with his SLP evaluation this morning and reports he likes it. Offered other supplements but patient reports he likes the butter pecan flavor and wants this supplement.  Patient endorses he has lost a significant amount of weight over the past few months. He reports his UBW was around 240 lbs. According to chart he was  120.2 kg on 12/14/2018, 90.3 kg on 01/25/2019, and is now 70.8 kg (156.09 lbs). Unsure if weight from 11/2 is accurate as that is a higher weight than pt is reporting he used to weigh. If weight history is correct he has lost at least 19.5 kg (21.6% body weight) over the past 2-3 months, which is significant for time frame.  Medications reviewed and include: gabapentin, Novolog 0-9 units TID, MVI daily, Flomax.  Labs reviewed: CBG 132-135, BUN 24, Creatinine 1.28. Vitamin B12 was 149 (low) on 12/27/2018 and 172 (low) on 03/17/2019.  NUTRITION - FOCUSED PHYSICAL EXAM:    Most Recent Value  Orbital Region  Moderate depletion  Upper Arm Region  Moderate depletion  Thoracic and Lumbar Region  Mild depletion  Buccal Region  Moderate depletion  Temple Region  Moderate depletion  Clavicle Bone Region  Moderate depletion  Clavicle and Acromion Bone Region  Moderate depletion  Scapular Bone Region  Mild depletion  Dorsal Hand  Moderate depletion  Patellar Region  Severe depletion  Anterior Thigh Region  Severe depletion  Posterior Calf Region  Severe depletion  Edema (RD Assessment)  None  Hair  Reviewed  Eyes  Reviewed  Mouth  Reviewed  Skin  Reviewed  Nails  Reviewed     Diet Order:   Diet Order            DIET DYS 3 Room service appropriate? Yes with Assist; Fluid consistency: Thin  Diet effective now             EDUCATION NEEDS:  No education needs have been identified at this time  Skin:  Skin Assessment: Skin Integrity Issues:(blanchable sacrum; ecchymosis)  Last BM:  03/29/2019 type 7  Height:   Ht Readings from Last 1 Encounters:  03/28/19 _0  (1.753 m)   Weight:   Wt Readings from Last 1 Encounters:  03/28/19 70.8 kg   Ideal Body Weight:  72.7 kg  BMI:  Body mass index is 23.05 kg/m.  Estimated Nutritional Needs:   Kcal:  1800-2000  Protein:  90-100 grams  Fluid:  1.8-2 L/day  Jacklynn Barnacle, MS, RD, LDN Pager number available on Amion

## 2019-03-29 NOTE — Progress Notes (Signed)
SLP Cancellation Note  Patient Details Name: Peter Parsons MRN: [id number] DOB: Sep 02, 1960   Cancelled treatment:       Reason Eval/Treat Not Completed: (chart reviewed; consulted NSG then met w/ pt). Per chart notes, pt has a Baseline dx of Schizophrenia who has been in the [facility] for the last 44 days for treatment. He is slow in his overall motor movements and speech; apparent catatonia-like behavior. He is able to follow simple commands and speech is clear at 1-3 word response level -- able to repeat numbers and name clearly and appropriately. MRI on 03/28/2019 was Negative - "normal examination".  Suspect pt's cognitive-linguistic presentation could be related to his Psychiatric worsening requiring admission to [place], Acute unit of hospital. Recommend as pt's Psychiatric Baseline improves, f/u w/ Outpatient ST services could be sought then for a formal cognitive-linguistic evaluation to determine any further needs. Any Psychiatric decline can impact linguistic abilities. NSG/CM updated.     Peter Kenner, MS, CCC-SLP Rayson Rando 03/29/2019, 9:32 AM

## 2019-03-29 NOTE — Consult Note (Signed)
[facility] Consult   Reason for Consult: Patient from [facility] Referring Physician: Dr. Posey Pronto Patient Identification: Peter Parsons MRN:  [id number] Principal Diagnosis: Paranoid schizophrenia ([place]) Diagnosis:  Principal Problem:   Paranoid schizophrenia ([place]) Active Problems:   Altered mental status   Dementia ([place])   Weakness   Acute metabolic encephalopathy   Total Time spent with patient: 30 minutes  Subjective:   Peter Parsons is a 59 y.o. male patient admitted with weakness  HPI: Patient is a 59 year old male with a history of schizophrenia and dementia, recent MOCA score of 19/30, who was admitted due to weakness.  Patient had formally evaluate in the [facility] for approximately a month and a half during which time he slowly declined and grew weaker until he met criteria for hospitalization.  Patient had been a placement issue due to his social situation.  Patient with schizophrenia most of his life,, raised by his parents who are now elderly with dementia themselves and unable to care for the patient in the home.  During his time in the [facility] patient showed increased anxiety and was prescribed Klonopin on a as needed basis.  He also started to show some symptoms of depression and was started on a low-dose of an antidepressant medication.   Today on evaluation patient presents feeling tired due to his multiple tasks.  He request not to have a conversation, but continues out anyway.  Patient reiterates multiple times that he would like to go home to his parents.  Patient was explained how this is not an option due to their increasing age and inability to care for him.  Patient continues to request to return home to them despite being told multiple times that this is not to be a possibility.    Past Psychiatric History: Patient with history of schizoaffective illness, multiple inpatient stays during the course of his life.  Risk to Self: Suicidal Ideation: Yes-Currently  Present Suicidal Intent: No Is patient at risk for suicide?: No, but patient needs Medical Clearance Suicidal Plan?: Yes-Currently Present Specify Current Suicidal Plan: Drown his self Access to Means: No Specify Access to Suicidal Means: Drown self What has been your use of drugs/alcohol within the last 12 months?: Reports of none How many times?: 1 Other Self Harm Risks: Reports of none Triggers for Past Attempts: Other (Comment)(Mental Illness) Intentional Self Injurious Behavior: None Risk to Others: Homicidal Ideation: No Thoughts of Harm to Others: No Current Homicidal Intent: No Current Homicidal Plan: No Access to Homicidal Means: No Identified Victim: Reports of none History of harm to others?: No Assessment of Violence: None Noted Violent Behavior Description: Reports of none Does patient have access to weapons?: No Criminal Charges Pending?: No Describe Pending Criminal Charges: Reports of none Does patient have a court date: No Prior Inpatient Therapy: Prior Inpatient Therapy: Yes Prior Therapy Dates: 01/2019, 12/2018 & 05/2018 Prior Therapy Facilty/Provider(s): [facility] Reason for Treatment: Schizophrenia Prior Outpatient Therapy: Prior Outpatient Therapy: Yes Prior Therapy Dates: Current Prior Therapy Facilty/Provider(s): [facility] Reason for Treatment: Schizophrenia([facility]) Does patient have an ACCT team?: Yes Does patient have Intensive In-House Services?  : No Does patient have [facility] services? : No Does patient have P4CC services?: No  Past Medical History:  Past Medical History:  Diagnosis Date  . Diabetes mellitus without complication ([place])   . Hypertension   . Schizophrenia ([place])    History reviewed. No pertinent surgical history. Family History: History reviewed. No  pertinent family history. Family Psychiatric  History: Denies Social History:  Social History   Substance and  Sexual Activity  Alcohol Use Not Currently     Social History   Substance and Sexual Activity  Drug Use Not Currently    Social History   Socioeconomic History  . Marital status: Single    Spouse name: Not on file  . Number of children: Not on file  . Years of education: Not on file  . Highest education level: Not on file  Occupational History  . Not on file  Tobacco Use  . Smoking status: Former Research scientist (life sciences)  . Smokeless tobacco: Never Used  Substance and Sexual Activity  . Alcohol use: Not Currently  . Drug use: Not Currently  . Sexual activity: Not on file  Other Topics Concern  . Not on file  Social History Narrative  . Not on file   Social Determinants of Health   Financial Resource Strain:   . Difficulty of Paying Living Expenses: Not on file  Food Insecurity:   . Worried About Charity fundraiser in the Last Year: Not on file  . Ran Out of Food in the Last Year: Not on file  Transportation Needs:   . Lack of Transportation (Medical): Not on file  . Lack of Transportation (Non-Medical): Not on file  Physical Activity:   . Days of Exercise per Week: Not on file  . Minutes of Exercise per Session: Not on file  Stress:   . Feeling of Stress : Not on file  Social Connections:   . Frequency of Communication with Friends and Family: Not on file  . Frequency of Social Gatherings with Friends and Family: Not on file  . Attends Religious Services: Not on file  . Active Member of Clubs or Organizations: Not on file  . Attends Archivist Meetings: Not on file  . Marital Status: Not on file   Additional Social History:    Allergies:   Allergies  Allergen Reactions  . Acetaminophen Other (See Comments)    Unknown Unable to breathe Chest  tightness/SOB     Labs:  Results for orders placed or performed during the hospital encounter of 02/11/19 (from the past 48 hour(s))  Comprehensive metabolic panel     Status: Abnormal   Collection Time: 03/27/19   9:52 PM  Result Value Ref Range   Sodium 142 135 - 145 mmol/L   Potassium 4.5 3.5 - 5.1 mmol/L   Chloride 102 98 - 111 mmol/L   CO2 29 22 - 32 mmol/L   Glucose, Bld 93 70 - 99 mg/dL   BUN 30 (H) 6 - 20 mg/dL   Creatinine, Ser 1.86 (H) 0.61 - 1.24 mg/dL   Calcium 9.4 8.9 - 10.3 mg/dL   Total Protein 7.5 6.5 - 8.1 g/dL   Albumin 4.4 3.5 - 5.0 g/dL   AST 21 15 - 41 U/L   ALT 13 0 - 44 U/L   Alkaline Phosphatase 59 38 - 126 U/L   Total Bilirubin 0.8 0.3 - 1.2 mg/dL   GFR calc non Af Amer 38 (L) >60 mL/min   GFR calc Af Amer 45 (L) >60 mL/min   Anion gap 11 5 - 15    Comment: Performed at [facility], [address] [place], [place] [address]  CBC with Differential     Status: Abnormal   Collection Time: 03/27/19  9:52 PM  Result Value Ref Range   WBC 5.4 4.0 -  10.5 K/uL   RBC 3.80 (L) 4.22 - 5.81 MIL/uL   Hemoglobin 11.3 (L) 13.0 - 17.0 g/dL   HCT 31.9 (L) 39.0 - 52.0 %   MCV 83.9 80.0 - 100.0 fL   MCH 29.7 26.0 - 34.0 pg   MCHC 35.4 30.0 - 36.0 g/dL   RDW 13.2 11.5 - 15.5 %   Platelets 147 (L) 150 - 400 K/uL   nRBC 0.0 0.0 - 0.2 %   Neutrophils Relative % 64 %   Neutro Abs 3.5 1.7 - 7.7 K/uL   Lymphocytes Relative 29 %   Lymphs Abs 1.6 0.7 - 4.0 K/uL   Monocytes Relative 6 %   Monocytes Absolute 0.3 0.1 - 1.0 K/uL   Eosinophils Relative 1 %   Eosinophils Absolute 0.0 0.0 - 0.5 K/uL   Basophils Relative 0 %   Basophils Absolute 0.0 0.0 - 0.1 K/uL   Immature Granulocytes 0 %   Abs Immature Granulocytes 0.02 0.00 - 0.07 K/uL    Comment: Performed at [facility], [place], [place] [address]  Troponin I (High Sensitivity)     Status: None   Collection Time: 03/27/19  9:52 PM  Result Value Ref Range   Troponin I (High Sensitivity) 9 <18 ng/L    Comment: (NOTE) Elevated high sensitivity troponin I (hsTnI) values and significant  changes across serial measurements may suggest ACS but many other  chronic and acute conditions are known to  elevate hsTnI results.  Refer to the "Links" section for chest pain algorithms and additional  guidance. Performed at [facility], [address] [place], [place] [address]   Urinalysis, Complete w Microscopic     Status: Abnormal   Collection Time: 03/27/19  9:53 PM  Result Value Ref Range   Color, Urine YELLOW (A) YELLOW   APPearance CLEAR (A) CLEAR   Specific Gravity, Urine 1.015 1.005 - 1.030   pH 5.0 5.0 - 8.0   Glucose, UA NEGATIVE NEGATIVE mg/dL   Hgb urine dipstick MODERATE (A) NEGATIVE   Bilirubin Urine NEGATIVE NEGATIVE   Ketones, ur 5 (A) NEGATIVE mg/dL   Protein, ur NEGATIVE NEGATIVE mg/dL   Nitrite NEGATIVE NEGATIVE   Leukocytes,Ua NEGATIVE NEGATIVE   RBC / HPF 21-50 0 - 5 RBC/hpf   WBC, UA 0-5 0 - 5 WBC/hpf   Bacteria, UA RARE (A) NONE SEEN   Squamous Epithelial / LPF NONE SEEN 0 - 5   Mucus PRESENT    Hyaline Casts, UA PRESENT     Comment: Performed at [facility], [address] [place], [place] [address]  Glucose, capillary     Status: None   Collection Time: 03/28/19  3:11 AM  Result Value Ref Range   Glucose-Capillary 77 70 - 99 mg/dL  Troponin I (High Sensitivity)     Status: None   Collection Time: 03/28/19  3:28 AM  Result Value Ref Range   Troponin I (High Sensitivity) 6 <18 ng/L    Comment: (NOTE) Elevated high sensitivity troponin I (hsTnI) values and significant  changes across serial measurements may suggest ACS but many other  chronic and acute conditions are known to elevate hsTnI results.  Refer to the "Links" section for chest pain algorithms and additional  guidance. Performed at [facility], [address] [place], [place] [address]   Hemoglobin A1c     Status: Abnormal   Collection Time: 03/28/19  3:28 AM  Result Value Ref Range   Hgb A1c  MFr Bld 4.5 (L) 4.8 - 5.6 %    Comment: (NOTE) Pre diabetes:          5.7%-6.4% Diabetes:              >6.4% Glycemic control for   <7.0% adults with diabetes     Mean Plasma Glucose 82.45 mg/dL    Comment: Performed at [facility] [address] [place], [place] [address]  Lipid panel     Status: Abnormal   Collection Time: 03/28/19  3:28 AM  Result Value Ref Range   Cholesterol 57 0 - 200 mg/dL   Triglycerides 77 <150 mg/dL   HDL 21 (L) >40 mg/dL   Total CHOL/HDL Ratio 2.7 RATIO   VLDL 15 0 - 40 mg/dL   LDL Cholesterol 21 0 - 99 mg/dL    Comment:        Total Cholesterol/HDL:CHD Risk Coronary Heart Disease Risk Table                     Men   Women  1/2 Average Risk   3.4   3.3  Average Risk       5.0   4.4  2 X Average Risk   9.6   7.1  3 X Average Risk  23.4   11.0        Use the calculated Patient Ratio above and the CHD Risk Table to determine the patient's CHD Risk.        ATP III CLASSIFICATION (LDL):  <100     mg/dL   Optimal  100-129  mg/dL   Near or Above                    Optimal  130-159  mg/dL   Borderline  160-189  mg/dL   High  >190     mg/dL   Very High Performed at [facility], [address] [place], [place] [address]   Glucose, capillary     Status: None   Collection Time: 03/28/19  9:29 AM  Result Value Ref Range   Glucose-Capillary 79 70 - 99 mg/dL  Glucose, capillary     Status: None   Collection Time: 03/28/19 12:20 PM  Result Value Ref Range   Glucose-Capillary 91 70 - 99 mg/dL  Glucose, capillary     Status: None   Collection Time: 03/28/19  5:33 PM  Result Value Ref Range   Glucose-Capillary 71 70 - 99 mg/dL  Glucose, capillary     Status: Abnormal   Collection Time: 03/28/19  6:22 PM  Result Value Ref Range   Glucose-Capillary 69 (L) 70 - 99 mg/dL  Glucose, capillary     Status: None   Collection Time: 03/28/19 11:09 PM  Result Value Ref Range   Glucose-Capillary 82 70 - 99 mg/dL  CBC     Status: Abnormal   Collection Time: 03/29/19  7:57 AM  Result Value Ref Range   WBC 4.3 4.0 - 10.5 K/uL   RBC 3.37 (L) 4.22 - 5.81 MIL/uL   Hemoglobin 10.2 (L) 13.0 - 17.0 g/dL    HCT 28.9 (L) 39.0 - 52.0 %   MCV 85.8 80.0 - 100.0 fL   MCH 30.3 26.0 - 34.0 pg   MCHC 35.3 30.0 - 36.0 g/dL   RDW 13.2 11.5 - 15.5 %   Platelets 131 (L) 150 - 400 K/uL   nRBC 0.0 0.0 - 0.2 %    Comment:  Performed at [facility], [address] [place], [place] [address]  Basic metabolic panel     Status: Abnormal   Collection Time: 03/29/19  7:57 AM  Result Value Ref Range   Sodium 139 135 - 145 mmol/L   Potassium 4.0 3.5 - 5.1 mmol/L   Chloride 102 98 - 111 mmol/L   CO2 28 22 - 32 mmol/L   Glucose, Bld 89 70 - 99 mg/dL   BUN 24 (H) 6 - 20 mg/dL   Creatinine, Ser 1.28 (H) 0.61 - 1.24 mg/dL   Calcium 9.0 8.9 - 10.3 mg/dL   GFR calc non Af Amer >60 >60 mL/min   GFR calc Af Amer >60 >60 mL/min   Anion gap 9 5 - 15    Comment: Performed at [facility], [address] [place], [place] [address]  Glucose, capillary     Status: Abnormal   Collection Time: 03/29/19  9:34 AM  Result Value Ref Range   Glucose-Capillary 132 (H) 70 - 99 mg/dL  Glucose, capillary     Status: Abnormal   Collection Time: 03/29/19 12:06 PM  Result Value Ref Range   Glucose-Capillary 135 (H) 70 - 99 mg/dL  CK     Status: None   Collection Time: 03/29/19 12:32 PM  Result Value Ref Range   Total CK 106 49 - 397 U/L    Comment: Performed at [facility], [address] [place], [place] [address]  Sedimentation rate     Status: Abnormal   Collection Time: 03/29/19 12:32 PM  Result Value Ref Range   Sed Rate 29 (H) 0 - 20 mm/hr    Comment: Performed at [facility], [address] [place], [place] [address]  Glucose, capillary     Status: None   Collection Time: 03/29/19  2:01 PM  Result Value Ref Range   Glucose-Capillary 83 70 - 99 mg/dL    Current Facility-Administered Medications  Medication Dose Route Frequency Provider Last Rate Last Admin  . atorvastatin (LIPITOR) tablet 10 mg  10 mg Oral Daily Carrie Mew, MD   10 mg at 03/29/19 1010  .  chlorhexidine (PERIDEX) 0.12 % solution 15 mL  15 mL Mouth/Throat BID Lavina Hamman, MD   15 mL at 03/29/19 1010  . clonazePAM (KLONOPIN) tablet 0.25 mg  0.25 mg Oral TID PRN Dixie Dials, MD   0.25 mg at 03/26/19 0942  . dextrose 50 % solution 50 mL  1 ampule Intravenous PRN Lavina Hamman, MD      . enoxaparin (LOVENOX) injection 40 mg  40 mg Subcutaneous Q24H Lavina Hamman, MD   40 mg at 03/29/19 0606  . [START ON 03/30/2019] escitalopram (LEXAPRO) tablet 10 mg  10 mg Oral Daily Abanoub Hanken A, MD      . feeding supplement (NEPRO CARB STEADY) liquid 237 mL  237 mL Oral TID BM Lavina Hamman, MD   237 mL at 03/29/19 1548  . gabapentin (NEURONTIN) capsule 300 mg  300 mg Oral TID Carrie Mew, MD   300 mg at 03/29/19 1548  . insulin aspart (novoLOG) injection 0-9 Units  0-9 Units Subcutaneous TID PC & HS Mansy, Arvella Merles, MD   1 Units at 03/29/19 1009  . multivitamin with minerals tablet 1 tablet  1 tablet Oral Daily Flornce Record, Dorene Ar, MD   1 tablet at 03/29/19 1010  . prazosin (MINIPRESS) capsule 2 mg  2 mg Oral QHS Johnn Hai, MD  2 mg at 03/28/19 2303  . risperiDONE (RISPERDAL) tablet 3 mg  3 mg Oral BID Carrie Mew, MD   3 mg at 03/29/19 1010  . senna-docusate (Senokot-S) tablet 1 tablet  1 tablet Oral QHS PRN Mansy, Jan A, MD      . tamsulosin (FLOMAX) capsule 0.4 mg  0.4 mg Oral Daily Carrie Mew, MD   0.4 mg at 03/29/19 1009  . traZODone (DESYREL) tablet 150 mg  150 mg Oral QHS Carrie Mew, MD   150 mg at 03/28/19 2302    Musculoskeletal: Strength & Muscle Tone: decreased Gait & Station: unable to stand Patient leans: N/A  Psychiatric Specialty Exam: Physical Exam  Review of Systems  Blood pressure 119/67, pulse 95, temperature 98.3 F (36.8 C), temperature source Oral, resp. rate 17, height 5' 9"  (1.753 m), weight 70.8 kg, SpO2 98 %.Body mass index is 23.05 kg/m.  General Appearance: Disheveled  Eye Contact:  Fair  Speech:  Slow  Volume:   Normal  Mood:  Depressed  Affect:  Constricted, Depressed and Flat  Thought Process:  Coherent  Orientation:  Full (Time, Place, and Person)  Thought Content:  Rumination  Suicidal Thoughts:  No  Homicidal Thoughts:  No  Memory:  Recent;   Fair  Judgement:  Impaired  Insight:  Shallow  Psychomotor Activity:  Decreased  Concentration:  Concentration: Poor  Recall:  Poor  Fund of Knowledge:  Fair  Language:  Fair  Akathisia:  No  Handed:  Right  AIMS (if indicated):     Assets:  Communication Skills Desire for Improvement  ADL's:  Impaired  Cognition:  Impaired,  Moderate  Sleep:        Treatment Plan Summary: 59 year old male with history of schizoaffective illness.  Patient now also has a diagnosis of dementia as well.  Patient with placement issues, now with severe weakness in the setting of prolonged hospitalization.  Patient's mood is somewhat depressed we will continue to to increase dose of Lexapro.  Medications: Increase Lexapro to 10 mg every morning  Diagnosis: Schizoaffective disorder, dementia  Disposition: Patient does not meet criteria for psychiatric inpatient admission. Supportive therapy provided about ongoing stressors. Discussed crisis plan, support from social network, calling 911, coming to the Emergency Department, and calling Suicide Hotline.  Dixie Dials, MD 03/29/2019 4:10 PM

## 2019-03-30 DIAGNOSIS — F200 Paranoid schizophrenia: Secondary | ICD-10-CM | POA: Diagnosis not present

## 2019-03-30 DIAGNOSIS — R531 Weakness: Secondary | ICD-10-CM | POA: Diagnosis not present

## 2019-03-30 LAB — COMPREHENSIVE METABOLIC PANEL
ALT: 13 U/L (ref 0–44)
AST: 16 U/L (ref 15–41)
Albumin: 3.5 g/dL (ref 3.5–5.0)
Alkaline Phosphatase: 49 U/L (ref 38–126)
Anion gap: 8 (ref 5–15)
BUN: 17 mg/dL (ref 6–20)
CO2: 31 mmol/L (ref 22–32)
Calcium: 9 mg/dL (ref 8.9–10.3)
Chloride: 103 mmol/L (ref 98–111)
Creatinine, Ser: 1.07 mg/dL (ref 0.61–1.24)
GFR calc Af Amer: >60 mL/min (ref >60)
GFR calc non Af Amer: >60 mL/min (ref >60)
Glucose, Bld: 80 mg/dL (ref 70–99)
Potassium: 3.8 mmol/L (ref 3.5–5.1)
Sodium: 142 mmol/L (ref 135–145)
Total Bilirubin: 0.7 mg/dL (ref 0.3–1.2)
Total Protein: 6.1 g/dL — ABNORMAL LOW (ref 6.5–8.1)

## 2019-03-30 LAB — CBC WITH DIFFERENTIAL/PLATELET
Abs Immature Granulocytes: 0.01 10*3/uL (ref 0.00–0.07)
Basophils Absolute: 0 10*3/uL (ref 0.0–0.1)
Basophils Relative: 0 %
Eosinophils Absolute: 0.1 10*3/uL (ref 0.0–0.5)
Eosinophils Relative: 3 %
HCT: 28.6 % — ABNORMAL LOW (ref 39.0–52.0)
Hemoglobin: 10.3 g/dL — ABNORMAL LOW (ref 13.0–17.0)
Immature Granulocytes: 0 %
Lymphocytes Relative: 46 %
Lymphs Abs: 2.2 10*3/uL (ref 0.7–4.0)
MCH: 30.5 pg (ref 26.0–34.0)
MCHC: 36 g/dL (ref 30.0–36.0)
MCV: 84.6 fL (ref 80.0–100.0)
Monocytes Absolute: 0.4 10*3/uL (ref 0.1–1.0)
Monocytes Relative: 8 %
Neutro Abs: 2.1 10*3/uL (ref 1.7–7.7)
Neutrophils Relative %: 43 %
Platelets: 152 10*3/uL (ref 150–400)
RBC: 3.38 MIL/uL — ABNORMAL LOW (ref 4.22–5.81)
RDW: 13.1 % (ref 11.5–15.5)
WBC: 4.8 10*3/uL (ref 4.0–10.5)
nRBC: 0 % (ref 0.0–0.2)

## 2019-03-30 LAB — RPR: RPR Ser Ql: NONREACTIVE

## 2019-03-30 LAB — GLUCOSE, CAPILLARY
Glucose-Capillary: 90 mg/dL (ref 70–99)
Glucose-Capillary: 146 mg/dL — ABNORMAL HIGH (ref 70–99)
Glucose-Capillary: 136 mg/dL — ABNORMAL HIGH (ref 70–99)
Glucose-Capillary: 113 mg/dL — ABNORMAL HIGH (ref 70–99)

## 2019-03-30 LAB — MAGNESIUM: Magnesium: 1.6 mg/dL — ABNORMAL LOW (ref 1.7–2.4)

## 2019-03-30 MED ORDER — MAGNESIUM SULFATE 2 GM/50ML IV SOLN
2.0000 g | Freq: Once | INTRAVENOUS | Status: AC
  Administered 2019-03-30: 11:00:00 2 g via INTRAVENOUS
  Filled 2019-03-30: qty 50

## 2019-03-30 MED ORDER — CYANOCOBALAMIN 1000 MCG/ML IJ SOLN
1000.0000 ug | Freq: Once | INTRAMUSCULAR | Status: AC
  Administered 2019-03-30: 14:00:00 1000 ug via INTRAMUSCULAR
  Filled 2019-03-30: qty 1

## 2019-03-30 NOTE — TOC Progression Note (Signed)
Transition of Care [facility]) - Progression Note    Patient Details  Name: Peter Parsons MRN: [id number] Date of Birth: 04-28-1960  Transition of [facility] CM/SW Contact  Kyi Romanello, Gardiner Rhyme, LCSW Phone Number: 03/30/2019, 2:21 PM  Clinical Narrative:   Have spoken with Susan-ERCM to discuss plan for this gentleman. Will try to follow up with faxed out FL2's. Pt would like to return home with his parents which is not an option. Will continue to work on finding a memory care bed.         Expected Discharge Plan and Services                                                 Social Determinants of Health (SDOH) Interventions    Readmission Risk Interventions No flowsheet data found.

## 2019-03-30 NOTE — Progress Notes (Addendum)
[facility] Progress Note  Patient: Laurice Estabrook    [id number]  DOA: 02/11/2019     Date of Service: the patient was seen and examined on 03/30/2019  Chief Complaint  Patient presents with  . ivc   Brief hospital course: Initially presented on 02/11/2019 from group home as he has suicidal ideation and was wandering off in the woods.  Prior to this patient had multiple ER visits with injuries after he ran out of his group home.  Patient was admitted at behavioral health for suicidal ideation in November 2020. Initial psychiatric evaluation recommended inpatient psychiatric admission at [facility]. On 02/18/2019 psychiatric recommended patient is cleared from psychiatric point of view and now patient requires a locked memory care unit. While awaiting for placement in ER, EDP noted increasing confusion, generalized weakness and dysphagia and patient was referred for further work-up for admission. MRI brain negative for any acute stroke. Speech evaluation pending. Currently further plan is complete further work-up and consult psychiatry as well as TOC team for assistance.  Assessment and Plan: 1.  Dysphagia, dysarthria, left facial droop CVA ruled out with MRI. Currently still has some dysphagia. Potentially medication related. Speech therapy evaluation pending. PT recommends supervision for mobility. Psychiatric team initially recommended locked memory care unit.  2.  Depression, anxiety, bipolar disorder and schizophrenia. Frequent suicidal ideation. Currently IVC. Patient definitely does not have any capacity to make medical decisions. Patient is from a group home. Continue Lexapro Risperdal  Continue prazosin as well. --d/c Klonopin due to extreme somnolence today  3.  Type 2 diabetes mellitus uncontrolled with chronic kidney disease stage IIIa as well as peripheral neuropathy. Hold Metformin. Monitor renal function. Avoid nephrotoxic medications. Continue  sliding scale insulin but avoid long-acting insulin as the patient is hypoglycemic.  4.  BPH Continue Flomax  5.  Dyslipidemia Continue statin  6.  Essential hypertension Patient is on lisinopril for renal protective effect but we will discontinue that given his potassium is mildly elevated as well as renal function is elevated. Monitor blood pressure for now.  7.  B12 deficiency.  Folate deficiency We will replace with subcu B12 and oral replacement.  Severe Malnutrition related to social / environmental circumstances(inadequate oral intake) as evidenced by 21.6% weight loss over the past 2-3 months, moderate fat depletion, moderate-severe muscle depletion.   Diet: DYS 3 DVT Prophylaxis: Subcutaneous Heparin    Advance goals of care discussion: Full code  Family Communication: no family was present at bedside, at the time of interview.  Patient has a legal guardian.  Disposition:  Pt is from group home, admitted with dysarthria and dysphagia, originally brought in for suicidal ideation and currently IVC, still has confusion due to his schizophrenia, which precludes a safe discharge.  TOC team is working to identify locked memory care unit.  FL 2 send out on 02/26/2019. Discharge to memory care unit, when bed available.  Subjective:  Pt was very sleepy today, did wake up to deny pain, but didn't answer any other questions.  Physical Exam: Constitutional: NAD, very sleepy, difficult to wake up, but follows commands HEENT: conjunctivae and lids normal, EOMI, pocketed food in his mouth  CV: RRR no M,R,G. Distal pulses +2.  No cyanosis.   RESP: CTA B/L, normal respiratory effort  GI: +BS, NTND Extremities: No effusions, edema, or tenderness in BLE SKIN: warm, dry and intact Neuro: II - XII grossly intact.      Vitals:   03/29/19 1016 03/29/19 1653 03/30/19 0017 03/30/19  0820  BP: 119/67 130/74 107/76 98/67  Pulse: 95 89 (!) 59 (!) 52  Resp: 17   16  Temp: 98.3 F (36.8 C)  99.1 F (37.3 C) 98.2 F (36.8 C) 98.2 F (36.8 C)  TempSrc: Oral Oral Oral Oral  SpO2: 98% 97% 97% 97%  Weight:      Height:        Intake/Output Summary (Last 24 hours) at 03/30/2019 1758 Last data filed at 03/30/2019 1330 Gross per 24 hour  Intake --  Output 400 ml  Net -400 ml   Filed Weights   02/11/19 1441 03/28/19 0306  Weight: 77.1 kg 70.8 kg    Data Reviewed: I have personally reviewed and interpreted daily labs, tele strips, imagings as discussed above. I reviewed all nursing notes, pharmacy notes, vitals, pertinent old records   CBC: Recent Labs  Lab 03/27/19 2152 03/29/19 0757 03/30/19 0404  WBC 5.4 4.3 4.8  NEUTROABS 3.5  --  2.1  HGB 11.3* 10.2* 10.3*  HCT 31.9* 28.9* 28.6*  MCV 83.9 85.8 84.6  PLT 147* 131* 0000000   Basic Metabolic Panel: Recent Labs  Lab 03/27/19 2152 03/29/19 0757 03/30/19 0404  NA 142 139 142  K 4.5 4.0 3.8  CL 102 102 103  CO2 29 28 31   GLUCOSE 93 89 80  BUN 30* 24* 17  CREATININE 1.86* 1.28* 1.07  CALCIUM 9.4 9.0 9.0  MG  --   --  1.6*    Studies: No results found.  Scheduled Meds: . atorvastatin  10 mg Oral Daily  . chlorhexidine  15 mL Mouth/Throat BID  . enoxaparin (LOVENOX) injection  40 mg Subcutaneous Q24H  . escitalopram  10 mg Oral Daily  . feeding supplement (NEPRO CARB STEADY)  237 mL Oral TID BM  . gabapentin  300 mg Oral TID  . insulin aspart  0-9 Units Subcutaneous TID PC & HS  . multivitamin with minerals  1 tablet Oral Daily  . prazosin  2 mg Oral QHS  . risperiDONE  3 mg Oral BID  . tamsulosin  0.4 mg Oral Daily  . traZODone  150 mg Oral QHS   Continuous Infusions:  PRN Meds: clonazePAM, dextrose, senna-docusate  Enzo Bi, MD  [facility] 03/30/2019 5:58 PM  To reach On-call, see care teams to locate the attending and reach out to them via [email or web address]. If 7PM-7AM, please contact night-coverage If you still have difficulty reaching the attending provider, please page the [facility]  (Director on Call) for [facility] on amion for assistance.

## 2019-03-30 NOTE — Progress Notes (Addendum)
Subjective: Patient sleeping today.  Has not been able to be awakened even to eat.    Objective: Current vital signs: BP 98/67   Pulse (!) 52   Temp 98.2 F (36.8 C) (Oral)   Resp 16   Ht _0  (1.753 m)   Wt 70.8 kg   SpO2 97%   BMI 23.05 kg/m  Vital signs in last 24 hours: Temp:  [98.2 F (36.8 C)-99.1 F (37.3 C)] 98.2 F (36.8 C) (02/16 0820) Pulse Rate:  [52-89] 52 (02/16 0820) Resp:  [16] 16 (02/16 0820) BP: (98-130)/(67-76) 98/67 (02/16 0820) SpO2:  [97 %] 97 % (02/16 0820)  Intake/Output from previous day: 02/15 0701 - 02/16 0700 In: 480 [P.O.:480] Out: 1000 [Urine:1000] Intake/Output this shift: No intake/output data recorded. Nutritional status:  Diet Order            DIET DYS 3 Room service appropriate? Yes with Assist; Fluid consistency: Thin  Diet effective now              Neurologic Exam: Mental Status: Extremely lethargic.  Alerts to deep sternal rub.  Speech minimal but appropriate.  Follows commands.   Cranial Nerves: II: Blinks to bilateral confrontation III,IV, VI: Extra-ocular motions intact bilaterally V,VII: mild right facial droop VIII: hearing normal bilaterally IX,X: gag reflex present XI: bilateral shoulder shrug XII: midline tongue extension Motor: Moves all extremities weakly to command Sensory: Pinprick and light touch intact throughout, bilaterally  Lab Results: Basic Metabolic Panel: Recent Labs  Lab 03/27/19 2152 03/29/19 0757 03/30/19 0404  NA 142 139 142  K 4.5 4.0 3.8  CL 102 102 103  CO2 _1 GLUCOSE 93 89 80  BUN 30* 24* 17  CREATININE 1.86* 1.28* 1.07  CALCIUM 9.4 9.0 9.0  MG  --   --  1.6*    Liver Function Tests: Recent Labs  Lab 03/27/19 2152 03/30/19 0404  AST 21 16  ALT 13 13  ALKPHOS 59 49  BILITOT 0.8 0.7  PROT 7.5 6.1*  ALBUMIN 4.4 3.5   No results for input(s): LIPASE, AMYLASE in the last 168 hours. No results for input(s): AMMONIA in the last 168 hours.  CBC: Recent Labs   Lab 03/27/19 2152 03/29/19 0757 03/30/19 0404  WBC 5.4 4.3 4.8  NEUTROABS 3.5  --  2.1  HGB 11.3* 10.2* 10.3*  HCT 31.9* 28.9* 28.6*  MCV 83.9 85.8 84.6  PLT 147* 131* 152    Cardiac Enzymes: Recent Labs  Lab 03/29/19 1232  CKTOTAL 106    Lipid Panel: Recent Labs  Lab 03/28/19 0328  CHOL 57  TRIG 77  HDL 21*  CHOLHDL 2.7  VLDL 15  LDLCALC 21    CBG: Recent Labs  Lab 03/29/19 1206 03/29/19 1401 03/29/19 1651 03/29/19 2112 03/30/19 0817  GLUCAP 135* 83 137* 226* 90    Microbiology: Results for orders placed or performed during the hospital encounter of 02/11/19  Respiratory Panel by RT PCR (Flu A&B, Covid) - Nasopharyngeal Swab     Status: None   Collection Time: 02/11/19  4:40 PM   Specimen: Nasopharyngeal Swab  Result Value Ref Range Status   SARS Coronavirus 2 by RT PCR NEGATIVE NEGATIVE Final    Comment: (NOTE) SARS-CoV-2 target nucleic acids are NOT DETECTED. The SARS-CoV-2 RNA is generally detectable in upper respiratoy specimens during the acute phase of infection. The lowest concentration of SARS-CoV-2 viral copies this assay can detect is 131 copies/mL. A negative result does not preclude  SARS-Cov-2 infection and should not be used as the sole basis for treatment or other patient management decisions. A negative result may occur with  improper specimen collection/handling, submission of specimen other than nasopharyngeal swab, presence of viral mutation(s) within the areas targeted by this assay, and inadequate number of viral copies (<131 copies/mL). A negative result must be combined with clinical observations, patient history, and epidemiological information. The expected result is Negative. Fact Sheet for Patients:  PinkCheek.be Fact Sheet for Healthcare Providers:  GravelBags.it This test is not yet ap proved or cleared by the [place] FDA and  has been authorized for  detection and/or diagnosis of SARS-CoV-2 by FDA under an Emergency Use Authorization (EUA). This EUA will remain  in effect (meaning this test can be used) for the duration of the COVID-19 declaration under Section 564(b)(1) of the Act, 21 U.S.C. section 360bbb-3(b)(1), unless the authorization is terminated or revoked sooner.    Influenza A by PCR NEGATIVE NEGATIVE Final   Influenza B by PCR NEGATIVE NEGATIVE Final    Comment: (NOTE) The Xpert Xpress SARS-CoV-2/FLU/RSV assay is intended as an aid in  the diagnosis of influenza from Nasopharyngeal swab specimens and  should not be used as a sole basis for treatment. Nasal washings and  aspirates are unacceptable for Xpert Xpress SARS-CoV-2/FLU/RSV  testing. Fact Sheet for Patients: PinkCheek.be Fact Sheet for Healthcare Providers: GravelBags.it This test is not yet approved or cleared by the [place] FDA and  has been authorized for detection and/or diagnosis of SARS-CoV-2 by  FDA under an Emergency Use Authorization (EUA). This EUA will remain  in effect (meaning this test can be used) for the duration of the  Covid-19 declaration under Section 564(b)(1) of the Act, 21  U.S.C. section 360bbb-3(b)(1), unless the authorization is  terminated or revoked. Performed at [facility], [place], [place] [address]   SARS CORONAVIRUS 2 (TAT 6-24 HRS) Nasopharyngeal Nasopharyngeal Swab     Status: None   Collection Time: 02/18/19  2:52 PM   Specimen: Nasopharyngeal Swab  Result Value Ref Range Status   SARS Coronavirus 2 NEGATIVE NEGATIVE Final    Comment: (NOTE) SARS-CoV-2 target nucleic acids are NOT DETECTED. The SARS-CoV-2 RNA is generally detectable in upper and lower respiratory specimens during the acute phase of infection. Negative results do not preclude SARS-CoV-2 infection, do not rule out co-infections with other pathogens, and should not  be used as the sole basis for treatment or other patient management decisions. Negative results must be combined with clinical observations, patient history, and epidemiological information. The expected result is Negative. Fact Sheet for Patients: SugarRoll.be Fact Sheet for Healthcare Providers: https://www.woods-mathews.com/ This test is not yet approved or cleared by the [place] FDA and  has been authorized for detection and/or diagnosis of SARS-CoV-2 by FDA under an Emergency Use Authorization (EUA). This EUA will remain  in effect (meaning this test can be used) for the duration of the COVID-19 declaration under Section 56 4(b)(1) of the Act, 21 U.S.C. section 360bbb-3(b)(1), unless the authorization is terminated or revoked sooner. Performed at [facility], [place] [address], [place], [place] [address]   SARS CORONAVIRUS 2 (TAT 6-24 HRS) Nasopharyngeal Nasopharyngeal Swab     Status: None   Collection Time: 02/22/19  2:54 PM   Specimen: Nasopharyngeal Swab  Result Value Ref Range Status   SARS Coronavirus 2 NEGATIVE NEGATIVE Final    Comment: (NOTE) SARS-CoV-2 target nucleic acids are NOT DETECTED. The SARS-CoV-2  RNA is generally detectable in upper and lower respiratory specimens during the acute phase of infection. Negative results do not preclude SARS-CoV-2 infection, do not rule out co-infections with other pathogens, and should not be used as the sole basis for treatment or other patient management decisions. Negative results must be combined with clinical observations, patient history, and epidemiological information. The expected result is Negative. Fact Sheet for Patients: SugarRoll.be Fact Sheet for Healthcare Providers: https://www.woods-mathews.com/ This test is not yet approved or cleared by the [place] FDA and  has been authorized for detection and/or diagnosis of  SARS-CoV-2 by FDA under an Emergency Use Authorization (EUA). This EUA will remain  in effect (meaning this test can be used) for the duration of the COVID-19 declaration under Section 56 4(b)(1) of the Act, 21 U.S.C. section 360bbb-3(b)(1), unless the authorization is terminated or revoked sooner. Performed at [facility], [place] [address] [place], [place] [address]   SARS CORONAVIRUS 2 (TAT 6-24 HRS) Nasopharyngeal Nasopharyngeal Swab     Status: None   Collection Time: 02/25/19  1:16 PM   Specimen: Nasopharyngeal Swab  Result Value Ref Range Status   SARS Coronavirus 2 NEGATIVE NEGATIVE Final    Comment: (NOTE) SARS-CoV-2 target nucleic acids are NOT DETECTED. The SARS-CoV-2 RNA is generally detectable in upper and lower respiratory specimens during the acute phase of infection. Negative results do not preclude SARS-CoV-2 infection, do not rule out co-infections with other pathogens, and should not be used as the sole basis for treatment or other patient management decisions. Negative results must be combined with clinical observations, patient history, and epidemiological information. The expected result is Negative. Fact Sheet for Patients: SugarRoll.be Fact Sheet for Healthcare Providers: https://www.woods-mathews.com/ This test is not yet approved or cleared by the [place] FDA and  has been authorized for detection and/or diagnosis of SARS-CoV-2 by FDA under an Emergency Use Authorization (EUA). This EUA will remain  in effect (meaning this test can be used) for the duration of the COVID-19 declaration under Section 56 4(b)(1) of the Act, 21 U.S.C. section 360bbb-3(b)(1), unless the authorization is terminated or revoked sooner. Performed at [facility], [place] [address] [place], [place] [address]     Coagulation Studies: No results for input(s): LABPROT, INR in the last 72 hours.  Imaging: MR CERVICAL SPINE  WO CONTRAST  Result Date: 03/29/2019 CLINICAL DATA:  Bilateral lower extremity weakness. EXAM: MRI CERVICAL SPINE WITHOUT CONTRAST TECHNIQUE: Multiplanar, multisequence MR imaging of the cervical spine was performed. No intravenous contrast was administered. COMPARISON:  None. FINDINGS: Alignment: Normal Vertebrae: Normal marrow signal. No bone lesions or fractures. Cord: Normal cord signal intensity. No cord lesions or syrinx. Posterior Fossa, vertebral arteries, paraspinal tissues: Mild degenerative changes at C1-2 with early pannus formation but no mass effect on the upper cervical cord. Disc levels: C2-3: Broad-based left paracentral and foraminal disc protrusion with mild mass effect on the ventral thecal sac and moderate left foraminal stenosis. Left-sided facet disease and left-sided uncinate spurring changes are contributory. C3-4: Bulging annulus, shallow central disc protrusion, osteophytic ridging and uncinate spurring contributing to flattening of the ventral thecal sac and narrowing the ventral CSF space along with bilateral foraminal stenosis moderate on the left and moderate to advanced on the right. C4-5: Bulging annulus, osteophytic ridging, uncinate spurring and facet disease contributing to mild spinal and moderate bilateral foraminal stenosis, left greater than right. C5-6: Bulging annulus with slight flattening of the ventral thecal sac but no significant spinal  stenosis. There are uncinate spurring changes bilaterally with mild right and moderate left foraminal stenosis. C6-7: Bilateral foraminal disc osteophyte complexes, left greater than right with moderate bilateral foraminal stenosis, left greater than right. C7-T1: No significant findings. IMPRESSION: 1. Degenerative cervical spondylosis with multilevel disc disease and facet disease. 2. Multilevel multifactorial spinal and foraminal stenosis as discussed above at the individual levels. 3. Normal MR appearance of the cervical spinal  cord. No cord lesions. Electronically Signed   By: Marijo Sanes M.D.   On: 03/29/2019 13:46   ECHOCARDIOGRAM COMPLETE  Result Date: 03/28/2019    ECHOCARDIOGRAM REPORT   Patient Name:   Peter Parsons Date of Exam: 03/28/2019 Medical Rec #:  [id number]    Height:       69.0 in Accession #:    [id number]   Weight:       156.1 lb Date of Birth:  [birth year]    BSA:          1.86 m Patient Age:    59 years     BP:           116/84 mmHg Patient Gender: M            HR:           66 bpm. Exam Location:  [facility] Procedure: 2D Echo, Cardiac Doppler and Color Doppler Indications:     TIA 435.9  History:         Patient has no prior history of Echocardiogram examinations.                  Risk Factors:Hypertension and Diabetes.  Sonographer:     Alyse Low Roar Referring Phys:  [id number] [place] Diagnosing Phys: Serafina Royals MD  Sonographer Comments: Technically difficult study due to poor echo windows. IMPRESSIONS  1. Left ventricular ejection fraction, by estimation, is 50 to 55%. The left ventricle has low normal function. LV endocardial border not optimally defined to evaluate regional wall motion. Left ventricular diastolic parameters were normal.  2. Right ventricular systolic function is normal. The right ventricular size is mildly enlarged. There is normal pulmonary artery systolic pressure.  3. The mitral valve is normal in structure and function. Mild mitral valve regurgitation.  4. The aortic valve is normal in structure and function. Aortic valve regurgitation is not visualized. FINDINGS  Left Ventricle: Left ventricular ejection fraction, by estimation, is 50 to 55%. The left ventricle has low normal function. LV endocardial border not optimally defined to evaluate regional wall motion. There is no left ventricular hypertrophy. Left ventricular diastolic parameters were normal. Right Ventricle: The right ventricular size is mildly enlarged. No increase in right ventricular wall thickness. Right ventricular  systolic function is normal. There is normal pulmonary artery systolic pressure. The tricuspid regurgitant velocity is 2.16  m/s, and with an assumed right atrial pressure of 10 mmHg, the estimated right ventricular systolic pressure is 03.7 mmHg. Left Atrium: Left atrial size was normal in size. Right Atrium: Right atrial size was normal in size. Pericardium: There is no evidence of pericardial effusion. Mitral Valve: The mitral valve is normal in structure and function. Mild mitral valve regurgitation. Tricuspid Valve: The tricuspid valve is normal in structure. Tricuspid valve regurgitation is mild. Aortic Valve: The aortic valve is normal in structure and function. Aortic valve regurgitation is not visualized. Pulmonic Valve: The pulmonic valve was normal in structure. Pulmonic valve regurgitation is not visualized. Aorta: The aortic root and ascending aorta are structurally normal, with  no evidence of dilitation. IAS/Shunts: No atrial level shunt detected by color flow Doppler.  LEFT VENTRICLE PLAX 2D LVIDd:         4.71 cm  Diastology LVIDs:         3.54 cm  LV e' lateral:   13.90 cm/s LV PW:         1.06 cm  LV E/e' lateral: 4.4 LV IVS:        1.19 cm  LV e' medial:    9.57 cm/s LVOT diam:     1.90 cm  LV E/e' medial:  6.4 LV SV Index:   27.21 LVOT Area:     2.84 cm  RIGHT VENTRICLE RV Mid diam:    3.59 cm RV S prime:     16.30 cm/s TAPSE (M-mode): 1.9 cm LEFT ATRIUM           Index       RIGHT ATRIUM           Index LA diam:      4.20 cm 2.26 cm/m  RA Area:     17.80 cm LA Vol (A2C): 67.5 ml 36.30 ml/m RA Volume:   46.00 ml  24.74 ml/m LA Vol (A4C): 40.8 ml 21.94 ml/m                        PULMONIC VALVE AORTA                 PV Vmax:        0.58 m/s Ao Root diam: 3.70 cm PV Peak grad:   1.4 mmHg                       RVOT Peak grad: 1 mmHg  MITRAL VALVE               TRICUSPID VALVE MV Area (PHT): 4.36 cm    TR Peak grad:   18.7 mmHg MV Decel Time: 174 msec    TR Vmax:        216.00 cm/s MV E  velocity: 61.60 cm/s MV A velocity: 48.00 cm/s  SHUNTS MV E/A ratio:  1.28        Systemic Diam: 1.90 cm Serafina Royals MD Electronically signed by Serafina Royals MD Signature Date/Time: 03/28/2019/4:26:59 PM    Final     Medications:  I have reviewed the patient's current medications. Scheduled: . atorvastatin  10 mg Oral Daily  . chlorhexidine  15 mL Mouth/Throat BID  . enoxaparin (LOVENOX) injection  40 mg Subcutaneous Q24H  . escitalopram  10 mg Oral Daily  . feeding supplement (NEPRO CARB STEADY)  237 mL Oral TID BM  . gabapentin  300 mg Oral TID  . insulin aspart  0-9 Units Subcutaneous TID PC & HS  . multivitamin with minerals  1 tablet Oral Daily  . prazosin  2 mg Oral QHS  . risperiDONE  3 mg Oral BID  . tamsulosin  0.4 mg Oral Daily  . traZODone  150 mg Oral QHS    Assessment/Plan: Patient quite lethargic today.  No significant medication changes other than increase in Lexapro which only happened today.  He did receive his Klonopin last evening which he did not receive the two previous days.  White blood cell count is stable and patient is afebrile.  MRI of the cervical spine shows no evidence of cord abnormality or significant spinal stenosis.  CK, RPR are normal.  ESR only mildly elevated.  Thiamine level is pending.  Psych following patient.  Recommendations: 1. Thiamine level pending 2.  B12 supplementation.  Injection today. 3.  Continued therapy 4. If he does not improve as the day progresses would repeat head CT without contrast    LOS: 1 day   Alexis Goodell, MD Neurology [phone] 03/30/2019  12:29 PM

## 2019-03-31 DIAGNOSIS — G9341 Metabolic encephalopathy: Secondary | ICD-10-CM | POA: Diagnosis not present

## 2019-03-31 DIAGNOSIS — F200 Paranoid schizophrenia: Secondary | ICD-10-CM | POA: Diagnosis not present

## 2019-03-31 LAB — GLUCOSE, CAPILLARY
Glucose-Capillary: 93 mg/dL (ref 70–99)
Glucose-Capillary: 116 mg/dL — ABNORMAL HIGH (ref 70–99)
Glucose-Capillary: 131 mg/dL — ABNORMAL HIGH (ref 70–99)
Glucose-Capillary: 80 mg/dL (ref 70–99)
Glucose-Capillary: 138 mg/dL — ABNORMAL HIGH (ref 70–99)

## 2019-03-31 LAB — CBC
HCT: 30.2 % — ABNORMAL LOW (ref 39.0–52.0)
Hemoglobin: 10.8 g/dL — ABNORMAL LOW (ref 13.0–17.0)
MCH: 30 pg (ref 26.0–34.0)
MCHC: 35.8 g/dL (ref 30.0–36.0)
MCV: 83.9 fL (ref 80.0–100.0)
Platelets: 146 10*3/uL — ABNORMAL LOW (ref 150–400)
RBC: 3.6 MIL/uL — ABNORMAL LOW (ref 4.22–5.81)
RDW: 13 % (ref 11.5–15.5)
WBC: 5.4 10*3/uL (ref 4.0–10.5)
nRBC: 0 % (ref 0.0–0.2)

## 2019-03-31 LAB — BASIC METABOLIC PANEL
Anion gap: 9 (ref 5–15)
BUN: 18 mg/dL (ref 6–20)
CO2: 31 mmol/L (ref 22–32)
Calcium: 9.2 mg/dL (ref 8.9–10.3)
Chloride: 102 mmol/L (ref 98–111)
Creatinine, Ser: 1.11 mg/dL (ref 0.61–1.24)
GFR calc Af Amer: >60 mL/min (ref >60)
GFR calc non Af Amer: >60 mL/min (ref >60)
Glucose, Bld: 98 mg/dL (ref 70–99)
Potassium: 3.8 mmol/L (ref 3.5–5.1)
Sodium: 142 mmol/L (ref 135–145)

## 2019-03-31 LAB — MAGNESIUM: Magnesium: 1.8 mg/dL (ref 1.7–2.4)

## 2019-03-31 NOTE — TOC Progression Note (Signed)
Transition of Care [facility]) - Progression Note    Patient Details  Name: Peter Parsons MRN: [id number] Date of Birth: 09/09/1960  Transition of Care [facility]) CM/SW Contact  Jullianna Gabor, Gardiner Rhyme, LCSW Phone Number: 03/31/2019, 2:07 PM  Clinical Narrative:   Have sent information to [place] [facility] and consults that mention dementia diagnosis. Have also place a call to [facility] geriatric psych to ask for input of place they sent their pt's. Looking for input and will continue to try to find a locked memory care unit for this gentleman.         Expected Discharge Plan and Services                                                 Social Determinants of Health (SDOH) Interventions    Readmission Risk Interventions No flowsheet data found.

## 2019-03-31 NOTE — Progress Notes (Signed)
Occupational Therapy Treatment Patient Details Name: Peter Parsons MRN: [id number] DOB: [birth year] Today's Date: 03/31/2019    History of present illness Pt is 59 y/o M with PMH including: T2DM, HTN, and schizophrenia who had been on the [facility] 44 days prior to this encounter. Pt presents d/t generalized weakness, dysphagia, and slurred speech. MRI negative for acute CVA.   OT comments  Mr. Baun was seen for OT treatment on this date. Pt received seated at EOB with room sitter present. Per pt sitter, pt has been up ad lib this date using the room bathroom. Pt reports that he feels "weaker than I'm used to". This Pryor Curia engages pt in seated BUE there-ex as listed below, pt requires moderate multimodal cueing for technique t/o session but is able to complete all exercises w/o increased fatigue with exertion. OT engages pt in standing grooming tasks at room sink this date. He requires increased time to perform each ADL and moves slowly throughout tasks. He able to complete face washing and hand hygine w/o need for physical assist from therapist this date. Pt fatigues quickly and requires seated therapeutic rest break after ~10 min of standing grooming tasks. Pt requires MIN cueing during session for re-direction to task. Pt asks "can you tell me who's in charge of all of this?". This Pryor Curia directs pt to room information board for orientation to care team. Pt left seated EOB with room sitter. Pt continues to benefit from skilled OT services to maximize return to PLOF and minimize risk of future falls, injury, caregiver burden, and readmission. Will continue to follow POC. Discharge recommendation remains appropriate.    Follow Up Recommendations  Home health OT;Supervision - Intermittent    Equipment Recommendations  3 in 1 bedside commode;Other (comment)    Recommendations for Other Services      Precautions / Restrictions Precautions Precautions: Fall Precaution Comments: Suicide precautions;  IVC Restrictions Weight Bearing Restrictions: No       Mobility Bed Mobility Overal bed mobility: Needs Assistance             General bed mobility comments: Deferred. Pt seated at EOB at start/end of session. Per sitter in room, pt has been ambulating ad lib to room bathroom.  Transfers Overall transfer level: Needs assistance Equipment used: Rolling walker (2 wheeled) Transfers: Sit to/from Stand Sit to Stand: Min guard;Supervision         General transfer comment: Pt ambulates in room with 1 person hand held assist to supervision for safety. No LOB appreciated during session.    Balance Overall balance assessment: Needs assistance Sitting-balance support: No upper extremity supported;Feet supported Sitting balance-Leahy Scale: Good Sitting balance - Comments: steady sitting reaching outside BOS Postural control: Posterior lean Standing balance support: During functional activity;No upper extremity supported Standing balance-Leahy Scale: Fair Standing balance comment: Pt ambulates in room w/o an AD this date.                           ADL either performed or assessed with clinical judgement   ADL                                         General ADL Comments: Pt performs standing grooming tasks at room sink this date. He requires increased time to perform and moves slowly throughout tasks, but is generally able to complete face washing  and hand hygine w/o need for physical assist from therapist this date. Pt fatigues quickly and requires seated therapeutic rest break after ~5 min of standing grooming tasks.     Vision       Perception     Praxis      Cognition Arousal/Alertness: Lethargic Behavior During Therapy: Flat affect Overall Cognitive Status: Difficult to assess                                 General Comments: Pt speech is slow and he demos limited eye contact/engagement. He requires multimodal cueing t/o  session to attend to tasks/therapeutic exercises. He continues to be lethargic, but agreeable to participation in OT tx this date.        Exercises General Exercises - Upper Extremity Shoulder Flexion: AROM;Both;10 reps Shoulder ABduction: AROM;Both;10 reps Elbow Flexion: AROM;Both;10 reps Elbow Extension: AROM;Both;10 reps Hand Exercises Wrist Flexion: AROM;Both;10 reps Wrist Extension: AROM;10 reps Digit Composite Flexion: AROM;Both;10 reps Composite Extension: AROM;Both;10 reps Other Exercises Other Exercises: OT engages pt in standing grooming tasks at sink in addition to BUE ther-ex as listed above. See ADL section for additional detail.   Shoulder Instructions       General Comments      Pertinent Vitals/ Pain       Pain Assessment: No/denies pain  Home Living                                          Prior Functioning/Environment              Frequency  Min 2X/week        Progress Toward Goals  OT Goals(current goals can now be found in the care plan section)  Progress towards OT goals: Progressing toward goals  Acute Rehab OT Goals Patient Stated Goal: to improve strength/balance OT Goal Formulation: With patient Time For Goal Achievement: 04/12/19 Potential to Achieve Goals: Good  Plan Discharge plan remains appropriate;Frequency remains appropriate    Co-evaluation                 AM-PAC OT "6 Clicks" Daily Activity     Outcome Measure   Help from another person eating meals?: None Help from another person taking care of personal grooming?: A Little Help from another person toileting, which includes using toliet, bedpan, or urinal?: A Little Help from another person bathing (including washing, rinsing, drying)?: A Little Help from another person to put on and taking off regular upper body clothing?: A Little Help from another person to put on and taking off regular lower body clothing?: A Little 6 Click Score: 19     End of Session Equipment Utilized During Treatment: Gait belt;Rolling walker  OT Visit Diagnosis: Unsteadiness on feet (R26.81);Muscle weakness (generalized) (M62.81)   Activity Tolerance Patient tolerated treatment well   Patient Left with call bell/phone within reach;in chair;with chair alarm set;with nursing/sitter in room   Nurse Communication          Time: [id number] OT Time Calculation (min): 19 min  Charges: OT General Charges $OT Visit: 1 Visit OT Treatments $Self Care/Home Management : 8-22 mins  Shara Blazing, M.S., OTR/L Ascom: [phone] 03/31/19, 1:25 PM

## 2019-03-31 NOTE — Progress Notes (Addendum)
[facility] Progress Note  Patient: Peter Parsons    [id number]  DOA: 02/11/2019     Date of Service: the patient was seen and examined on 03/31/2019  Chief Complaint  Patient presents with  . ivc   Brief hospital course: Initially presented on 02/11/2019 from group home as he has suicidal ideation and was wandering off in the woods.  Prior to this patient had multiple ER visits with injuries after he ran out of his group home.  Patient was admitted at behavioral health for suicidal ideation in November 2020. Initial psychiatric evaluation recommended inpatient psychiatric admission at [facility]. On 02/18/2019 psychiatric recommended patient is cleared from psychiatric point of view and now patient requires a locked memory care unit. While awaiting for placement in ER, EDP noted increasing confusion, generalized weakness and dysphagia and patient was referred for further work-up for admission. MRI brain negative for any acute stroke. Speech evaluation pending. Currently further plan is complete further work-up and consult psychiatry as well as TOC team for assistance.  Assessment and Plan: 1.  Dysphagia, dysarthria, left facial droop CVA ruled out with MRI. Currently still has some dysphagia. Potentially medication related. Speech therapy evaluation pending. PT recommends supervision for mobility. Psychiatric team initially recommended locked memory care unit.  2.  Depression, anxiety, bipolar disorder and schizophrenia. Frequent suicidal ideation. Currently IVC. Patient definitely does not have any capacity to make medical decisions. Patient is from a group home. Continue Lexapro Risperdal  Continue prazosin as well. --Hold Klonopin due to it causing extreme somnolence   3.  Type 2 diabetes mellitus uncontrolled with chronic kidney disease stage IIIa as well as peripheral neuropathy. Hold Metformin. Monitor renal function. Avoid nephrotoxic  medications. Continue sliding scale insulin but avoid long-acting insulin as the patient is hypoglycemic.  4.  BPH Continue Flomax  5.  Dyslipidemia Continue statin  6.  Essential hypertension Patient is on lisinopril for renal protective effect but we will discontinue that given his potassium is mildly elevated as well as renal function is elevated. Monitor blood pressure for now.  7.  B12 deficiency.  Folate deficiency We will replace with subcu B12 and oral replacement.  Severe Malnutrition related to social / environmental circumstances(inadequate oral intake) as evidenced by 21.6% weight loss over the past 2-3 months, moderate fat depletion, moderate-severe muscle depletion.  # No need for daily labs   Diet: DYS 3 DVT Prophylaxis: Subcutaneous Heparin    Advance goals of care discussion: Full code  Family Communication: Not today.  Patient has a legal guardian.  Disposition:  Pt is from group home, admitted with dysarthria and dysphagia, originally brought in for suicidal ideation and currently IVC, still has confusion due to his schizophrenia, which precludes a safe discharge.  TOC team is working to identify locked memory care unit.  FL 2 send out on 02/26/2019. Discharge to memory care unit, when bed available.  Subjective:  Benzo d/c'ed yesterday.  Pt was much more alert today.  Pt was able to walk to the bathroom on his own.  Still pockets food.  Continued to ask to go back home with his parents.  No fever, N/V/D.   Physical Exam: Constitutional: NAD, alert, oriented today HEENT: conjunctivae and lids normal, EOMI CV: No cyanosis.   RESP: normal respiratory effort  Extremities: No effusions, edema in BLE SKIN: warm, dry and intact Neuro: II - XII grossly intact.     Vitals:   03/30/19 2124 03/30/19 2128 03/31/19 0752 03/31/19 1614  BP: 131/90 131/90 103/65 137/86  Pulse:  93 66 88  Resp:    18  Temp:   99.3 F (37.4 C) 98.3 F (36.8 C)  TempSrc:    Oral   SpO2:  98% 97% 96%  Weight:      Height:        Intake/Output Summary (Last 24 hours) at 03/31/2019 1958 Last data filed at 03/31/2019 1647 Gross per 24 hour  Intake 360 ml  Output 502 ml  Net -142 ml   Filed Weights   02/11/19 1441 03/28/19 0306  Weight: 77.1 kg 70.8 kg    Data Reviewed: I have personally reviewed and interpreted daily labs, tele strips, imagings as discussed above. I reviewed all nursing notes, pharmacy notes, vitals, pertinent old records   CBC: Recent Labs  Lab 03/27/19 2152 03/29/19 0757 03/30/19 0404 03/31/19 0446  WBC 5.4 4.3 4.8 5.4  NEUTROABS 3.5  --  2.1  --   HGB 11.3* 10.2* 10.3* 10.8*  HCT 31.9* 28.9* 28.6* 30.2*  MCV 83.9 85.8 84.6 83.9  PLT 147* 131* 152 [id number]*   Basic Metabolic Panel: Recent Labs  Lab 03/27/19 2152 03/29/19 0757 03/30/19 0404 03/31/19 0446  NA 142 139 142 142  K 4.5 4.0 3.8 3.8  CL 102 102 103 102  CO2 29 28 31 31   GLUCOSE 93 89 80 98  BUN 30* 24* 17 18  CREATININE 1.86* 1.28* 1.07 1.11  CALCIUM 9.4 9.0 9.0 9.2  MG  --   --  1.6* 1.8    Studies: No results found.  Scheduled Meds: . atorvastatin  10 mg Oral Daily  . chlorhexidine  15 mL Mouth/Throat BID  . enoxaparin (LOVENOX) injection  40 mg Subcutaneous Q24H  . escitalopram  10 mg Oral Daily  . feeding supplement (NEPRO CARB STEADY)  237 mL Oral TID BM  . gabapentin  300 mg Oral TID  . insulin aspart  0-9 Units Subcutaneous TID PC & HS  . multivitamin with minerals  1 tablet Oral Daily  . prazosin  2 mg Oral QHS  . risperiDONE  3 mg Oral BID  . tamsulosin  0.4 mg Oral Daily  . traZODone  150 mg Oral QHS   Continuous Infusions:  PRN Meds: clonazePAM, dextrose, senna-docusate  Enzo Bi, MD  [facility] 03/31/2019 7:58 PM  To reach On-call, see care teams to locate the attending and reach out to them via [email or web address]. If 7PM-7AM, please contact night-coverage If you still have difficulty reaching the attending provider, please page  the [facility] (Director on Call) for [facility] on amion for assistance.

## 2019-03-31 NOTE — BH Assessment (Signed)
Pt  Placed  Under  New  IVC  PAPERS  DONE  BY  Fiza Nation  [place]  [place]  EMERGENCY  DEPT

## 2019-03-31 NOTE — Progress Notes (Signed)
Subjective: Patient awake and alert this morning.  Fed himself some breakfast.  Continues to complain of generalized weakness.    Objective: Current vital signs: BP 103/65 (BP Location: Left Arm)   Pulse 66   Temp 99.3 F (37.4 C)   Resp 16   Ht 5' 9"  (1.753 m)   Wt 70.8 kg   SpO2 97%   BMI 23.05 kg/m  Vital signs in last 24 hours: Temp:  [99.3 F (37.4 C)] 99.3 F (37.4 C) (02/17 0752) Pulse Rate:  [66-93] 66 (02/17 0752) BP: (103-131)/(65-90) 103/65 (02/17 0752) SpO2:  [97 %-98 %] 97 % (02/17 0752)  Intake/Output from previous day: 02/16 0701 - 02/17 0700 In: -  Out: 400 [Urine:400] Intake/Output this shift: Total I/O In: -  Out: 500 [Urine:500] Nutritional status:  Diet Order            DIET DYS 3 Room service appropriate? Yes with Assist; Fluid consistency: Thin  Diet effective now              Neurologic Exam: Mental Status: Alert and awake.  Speech fluent without evidence of aphasia.  Mild dysarthria noted.  Able to follow 3 step commands without difficulty. Cranial Nerves: II: Visual fields grossly normal, pupils equal, round, reactive to light and accommodation III,IV, VI: ptosis not present, extra-ocular motions intact bilaterally V,VII: mild right facial droop, facial light touch sensation normal bilaterally VIII: hearing normal bilaterally IX,X: gag reflex present XI: bilateral shoulder shrug XII: midline tongue extension Motor: Generalized weakness throughout but able to lift all extremities against gravity.  No focality noted.  Evidence of atrophy noted in all extremities   Lab Results: Basic Metabolic Panel: Recent Labs  Lab 03/27/19 2152 03/27/19 2152 03/29/19 0757 03/30/19 0404 03/31/19 0446  NA 142  --  139 142 142  K 4.5  --  4.0 3.8 3.8  CL 102  --  102 103 102  CO2 29  --  28 31 31   GLUCOSE 93  --  89 80 98  BUN 30*  --  24* 17 18  CREATININE 1.86*  --  1.28* 1.07 1.11  CALCIUM 9.4   < > 9.0 9.0 9.2  MG  --   --   --  1.6*  1.8   < > = values in this interval not displayed.    Liver Function Tests: Recent Labs  Lab 03/27/19 2152 03/30/19 0404  AST 21 16  ALT 13 13  ALKPHOS 59 49  BILITOT 0.8 0.7  PROT 7.5 6.1*  ALBUMIN 4.4 3.5   No results for input(s): LIPASE, AMYLASE in the last 168 hours. No results for input(s): AMMONIA in the last 168 hours.  CBC: Recent Labs  Lab 03/27/19 2152 03/29/19 0757 03/30/19 0404 03/31/19 0446  WBC 5.4 4.3 4.8 5.4  NEUTROABS 3.5  --  2.1  --   HGB 11.3* 10.2* 10.3* 10.8*  HCT 31.9* 28.9* 28.6* 30.2*  MCV 83.9 85.8 84.6 83.9  PLT 147* 131* 152 146*    Cardiac Enzymes: Recent Labs  Lab 03/29/19 1232  CKTOTAL 106    Lipid Panel: Recent Labs  Lab 03/28/19 0328  CHOL 57  TRIG 77  HDL 21*  CHOLHDL 2.7  VLDL 15  LDLCALC 21    CBG: Recent Labs  Lab 03/30/19 0817 03/30/19 1315 03/30/19 1624 03/30/19 2012 03/31/19 0756  GLUCAP 90 146* 136* 113* 64    Microbiology: Results for orders placed or performed during the hospital encounter of 02/11/19  Respiratory Panel by RT PCR (Flu A&B, Covid) - Nasopharyngeal Swab     Status: None   Collection Time: 02/11/19  4:40 PM   Specimen: Nasopharyngeal Swab  Result Value Ref Range Status   SARS Coronavirus 2 by RT PCR NEGATIVE NEGATIVE Final    Comment: (NOTE) SARS-CoV-2 target nucleic acids are NOT DETECTED. The SARS-CoV-2 RNA is generally detectable in upper respiratoy specimens during the acute phase of infection. The lowest concentration of SARS-CoV-2 viral copies this assay can detect is 131 copies/mL. A negative result does not preclude SARS-Cov-2 infection and should not be used as the sole basis for treatment or other patient management decisions. A negative result may occur with  improper specimen collection/handling, submission of specimen other than nasopharyngeal swab, presence of viral mutation(s) within the areas targeted by this assay, and inadequate number of viral copies (<131  copies/mL). A negative result must be combined with clinical observations, patient history, and epidemiological information. The expected result is Negative. Fact Sheet for Patients:  PinkCheek.be Fact Sheet for Healthcare Providers:  GravelBags.it This test is not yet ap proved or cleared by the [place] FDA and  has been authorized for detection and/or diagnosis of SARS-CoV-2 by FDA under an Emergency Use Authorization (EUA). This EUA will remain  in effect (meaning this test can be used) for the duration of the COVID-19 declaration under Section 564(b)(1) of the Act, 21 U.S.C. section 360bbb-3(b)(1), unless the authorization is terminated or revoked sooner.    Influenza A by PCR NEGATIVE NEGATIVE Final   Influenza B by PCR NEGATIVE NEGATIVE Final    Comment: (NOTE) The Xpert Xpress SARS-CoV-2/FLU/RSV assay is intended as an aid in  the diagnosis of influenza from Nasopharyngeal swab specimens and  should not be used as a sole basis for treatment. Nasal washings and  aspirates are unacceptable for Xpert Xpress SARS-CoV-2/FLU/RSV  testing. Fact Sheet for Patients: PinkCheek.be Fact Sheet for Healthcare Providers: GravelBags.it This test is not yet approved or cleared by the [place] FDA and  has been authorized for detection and/or diagnosis of SARS-CoV-2 by  FDA under an Emergency Use Authorization (EUA). This EUA will remain  in effect (meaning this test can be used) for the duration of the  Covid-19 declaration under Section 564(b)(1) of the Act, 21  U.S.C. section 360bbb-3(b)(1), unless the authorization is  terminated or revoked. Performed at [facility], [place], [place] [address]   SARS CORONAVIRUS 2 (TAT 6-24 HRS) Nasopharyngeal Nasopharyngeal Swab     Status: None   Collection Time: 02/18/19  2:52 PM   Specimen:  Nasopharyngeal Swab  Result Value Ref Range Status   SARS Coronavirus 2 NEGATIVE NEGATIVE Final    Comment: (NOTE) SARS-CoV-2 target nucleic acids are NOT DETECTED. The SARS-CoV-2 RNA is generally detectable in upper and lower respiratory specimens during the acute phase of infection. Negative results do not preclude SARS-CoV-2 infection, do not rule out co-infections with other pathogens, and should not be used as the sole basis for treatment or other patient management decisions. Negative results must be combined with clinical observations, patient history, and epidemiological information. The expected result is Negative. Fact Sheet for Patients: SugarRoll.be Fact Sheet for Healthcare Providers: https://www.woods-mathews.com/ This test is not yet approved or cleared by the [place] FDA and  has been authorized for detection and/or diagnosis of SARS-CoV-2 by FDA under an Emergency Use Authorization (EUA). This EUA will remain  in effect (meaning this test can be used) for the  duration of the COVID-19 declaration under Section 56 4(b)(1) of the Act, 21 U.S.C. section 360bbb-3(b)(1), unless the authorization is terminated or revoked sooner. Performed at [facility], [place] [address] [place], [place] [address]   SARS CORONAVIRUS 2 (TAT 6-24 HRS) Nasopharyngeal Nasopharyngeal Swab     Status: None   Collection Time: 02/22/19  2:54 PM   Specimen: Nasopharyngeal Swab  Result Value Ref Range Status   SARS Coronavirus 2 NEGATIVE NEGATIVE Final    Comment: (NOTE) SARS-CoV-2 target nucleic acids are NOT DETECTED. The SARS-CoV-2 RNA is generally detectable in upper and lower respiratory specimens during the acute phase of infection. Negative results do not preclude SARS-CoV-2 infection, do not rule out co-infections with other pathogens, and should not be used as the sole basis for treatment or other patient management  decisions. Negative results must be combined with clinical observations, patient history, and epidemiological information. The expected result is Negative. Fact Sheet for Patients: SugarRoll.be Fact Sheet for Healthcare Providers: https://www.woods-mathews.com/ This test is not yet approved or cleared by the [place] FDA and  has been authorized for detection and/or diagnosis of SARS-CoV-2 by FDA under an Emergency Use Authorization (EUA). This EUA will remain  in effect (meaning this test can be used) for the duration of the COVID-19 declaration under Section 56 4(b)(1) of the Act, 21 U.S.C. section 360bbb-3(b)(1), unless the authorization is terminated or revoked sooner. Performed at [facility], [place] [address] [place], [place] [address]   SARS CORONAVIRUS 2 (TAT 6-24 HRS) Nasopharyngeal Nasopharyngeal Swab     Status: None   Collection Time: 02/25/19  1:16 PM   Specimen: Nasopharyngeal Swab  Result Value Ref Range Status   SARS Coronavirus 2 NEGATIVE NEGATIVE Final    Comment: (NOTE) SARS-CoV-2 target nucleic acids are NOT DETECTED. The SARS-CoV-2 RNA is generally detectable in upper and lower respiratory specimens during the acute phase of infection. Negative results do not preclude SARS-CoV-2 infection, do not rule out co-infections with other pathogens, and should not be used as the sole basis for treatment or other patient management decisions. Negative results must be combined with clinical observations, patient history, and epidemiological information. The expected result is Negative. Fact Sheet for Patients: SugarRoll.be Fact Sheet for Healthcare Providers: https://www.woods-mathews.com/ This test is not yet approved or cleared by the [place] FDA and  has been authorized for detection and/or diagnosis of SARS-CoV-2 by FDA under an Emergency Use Authorization (EUA). This  EUA will remain  in effect (meaning this test can be used) for the duration of the COVID-19 declaration under Section 56 4(b)(1) of the Act, 21 U.S.C. section 360bbb-3(b)(1), unless the authorization is terminated or revoked sooner. Performed at [facility], [place] [address] [place], [place] [address]     Coagulation Studies: No results for input(s): LABPROT, INR in the last 72 hours.  Imaging: MR CERVICAL SPINE WO CONTRAST  Result Date: 03/29/2019 CLINICAL DATA:  Bilateral lower extremity weakness. EXAM: MRI CERVICAL SPINE WITHOUT CONTRAST TECHNIQUE: Multiplanar, multisequence MR imaging of the cervical spine was performed. No intravenous contrast was administered. COMPARISON:  None. FINDINGS: Alignment: Normal Vertebrae: Normal marrow signal. No bone lesions or fractures. Cord: Normal cord signal intensity. No cord lesions or syrinx. Posterior Fossa, vertebral arteries, paraspinal tissues: Mild degenerative changes at C1-2 with early pannus formation but no mass effect on the upper cervical cord. Disc levels: C2-3: Broad-based left paracentral and foraminal disc protrusion with mild mass effect on the ventral thecal sac  and moderate left foraminal stenosis. Left-sided facet disease and left-sided uncinate spurring changes are contributory. C3-4: Bulging annulus, shallow central disc protrusion, osteophytic ridging and uncinate spurring contributing to flattening of the ventral thecal sac and narrowing the ventral CSF space along with bilateral foraminal stenosis moderate on the left and moderate to advanced on the right. C4-5: Bulging annulus, osteophytic ridging, uncinate spurring and facet disease contributing to mild spinal and moderate bilateral foraminal stenosis, left greater than right. C5-6: Bulging annulus with slight flattening of the ventral thecal sac but no significant spinal stenosis. There are uncinate spurring changes bilaterally with mild right and moderate left foraminal  stenosis. C6-7: Bilateral foraminal disc osteophyte complexes, left greater than right with moderate bilateral foraminal stenosis, left greater than right. C7-T1: No significant findings. IMPRESSION: 1. Degenerative cervical spondylosis with multilevel disc disease and facet disease. 2. Multilevel multifactorial spinal and foraminal stenosis as discussed above at the individual levels. 3. Normal MR appearance of the cervical spinal cord. No cord lesions. Electronically Signed   By: Marijo Sanes M.D.   On: 03/29/2019 13:46    Medications:  I have reviewed the patient's current medications. Scheduled: . atorvastatin  10 mg Oral Daily  . chlorhexidine  15 mL Mouth/Throat BID  . enoxaparin (LOVENOX) injection  40 mg Subcutaneous Q24H  . escitalopram  10 mg Oral Daily  . feeding supplement (NEPRO CARB STEADY)  237 mL Oral TID BM  . gabapentin  300 mg Oral TID  . insulin aspart  0-9 Units Subcutaneous TID PC & HS  . multivitamin with minerals  1 tablet Oral Daily  . prazosin  2 mg Oral QHS  . risperiDONE  3 mg Oral BID  . tamsulosin  0.4 mg Oral Daily  . traZODone  150 mg Oral QHS    Assessment/Plan: 59 y.o. male with a history of HTN, DM and schizophrenia who was in the [facility] for 44 days.  Presented with acute worsening of generalized weakness, dysphagia, slurred speech and left facial droop.  Was admitted to rule out CVA.  MRI of the brain personally reviewed and shows no acute changes.  Carotid dopplers show no evidence of hemodynamically significant stenosis.  MRI of the cervical sine shows no evidence of spinal stenosis.  Echocardiogram shows no cardiac source of emboli with an EF of 50-55%.  Patient is able to eat but remains weak.  CK, RPR, TSH, ESR and folate are unremarkable.  B12 low.  B1 pending.  Started on B12 supplementation.    Recommendations: 1. Continue therapy and psych follow up.  If continued weakness patient may have NVC/EMG on an outpatient basis.     LOS: 2 days   Alexis Goodell, MD Neurology [phone] 03/31/2019  9:41 AM

## 2019-04-01 DIAGNOSIS — F200 Paranoid schizophrenia: Secondary | ICD-10-CM | POA: Diagnosis not present

## 2019-04-01 LAB — GLUCOSE, CAPILLARY
Glucose-Capillary: 89 mg/dL (ref 70–99)
Glucose-Capillary: 109 mg/dL — ABNORMAL HIGH (ref 70–99)
Glucose-Capillary: 105 mg/dL — ABNORMAL HIGH (ref 70–99)
Glucose-Capillary: 106 mg/dL — ABNORMAL HIGH (ref 70–99)

## 2019-04-01 NOTE — Progress Notes (Signed)
Speech Language Pathology Treatment: Dysphagia  Patient Details Name: Peter Parsons MRN: [id number] DOB: 27-Mar-1960 Today's Date: 04/01/2019 Time: 1210-1250 SLP Time Calculation (min) (ACUTE ONLY): 40 min  Assessment / Plan / Recommendation Clinical Impression  Pt seen for ongoing assessment of swallowing; toleration of oral diet and safety w/ oral intake. Pt continues to have a Sitter present; Psychiatry is following. Per MD note, Benzo d/c'ed yesterday w/ more alertness today. Pt continues to have still has confusion due to his schizophrenia per MD notes; oral phase deficits have been noted by [facility] staff.  Upon walking into room, pt had his lunch tray next to him. He nodded he had been eating some of the lunch meal - immediately noted Oral Pocketing of the increased textured foods of spaghetti and meat sauce. Oral cavity cleared and pt given trials of puree and thin liquids, including a milkshake per his request. Pt consumed trials of thin liquids via Straw/Cup and purees w/ no immediate, overt clinical s/s of aspiration; no decline in vocal quality or respiratory status noted during/post trials. Educated and instructed pt on general aspiration precautions to include smaller sips, slowly and to take his tongue/finger to sweep around his mouth to fully clear it of any foods during eating/meals. Practiced this w/ pt. Pt fed self w/ setup several bites of Purees and ~6-7 ozs of thin liquids including his milkshake during session. Pt is easily distracted and min impulsive w/ self-drinking of liquids -- requires redirection and monitoring during all oral intake for follow through and safety. Suspect oral phase deficits are directly related to his Baseline dxs of Cognitive status decline: Schizophrenia, Dementia, as per chart notes.  Recommend modifying diet to include more minced foods and purees (d/t Cognitive decline) -- Dysphagia level 2 (minced w/ pureed meats) w/ thin liquids; general aspiration  precautions; feeding support at meals w/ cues redirect attention to oral clearing during eating/drinking. Pills in Puree - Crushed for safer swallowing d/t Cognitive status, illness. ST services will continue to f/u w/ pt's status and provide education as needed. Suspect this diet consistency may be beneficial at Discharge to help pt meet nutritional needs adequate, safely. Recommend Dietician f/u for support also.      HPI HPI: Pt is 59 y/o M with PMH including: T2DM, HTN, Dementia per chart, and paranoid schizophrenia who had been on the [facility] 44 days prior to this encounter. Pt was admitted to the [facility] from his [place] after wandering into the woods near water. There were reports of suicidal ideations. Pt presents d/t the Acute floor d/t generalized weakness, suspicion of dysphagia, and slurred speech. There is concern for Medicaion effect per MD report. MRI negative for acute CVA.  Per chart, dx of Protein-calorie malnutrition, Severe.       SLP Plan  Continue with current plan of care       Recommendations  Diet recommendations: Dysphagia 2 (fine chop);Dysphagia 1 (puree);Thin liquid Liquids provided via: Cup;Straw(monitor) Medication Administration: Crushed with puree(as able to for safer swallowing) Supervision: Patient able to self feed;Staff to assist with self feeding;Intermittent supervision to cue for compensatory strategies Compensations: Minimize environmental distractions;Slow rate;Small sips/bites;Lingual sweep for clearance of pocketing;Multiple dry swallows after each bite/sip;Follow solids with liquid Postural Changes and/or Swallow Maneuvers: Seated upright 90 degrees;Upright 30-60 min after meal                General recommendations: (Dietician f/u) Oral Care Recommendations: Oral care BID;Oral care before and after PO;Staff/trained caregiver to provide oral care  Follow up Recommendations: None SLP Visit Diagnosis: Dysphagia, oral phase (R13.11)(suspect d/t mental  status decline) Plan: Continue with current plan of care       [place], [place], CCC-SLP Lincy Belles 04/01/2019, 4:06 PM

## 2019-04-01 NOTE — NC FL2 (Signed)
[place] LEVEL OF CARE SCREENING TOOL     IDENTIFICATION  Patient Name: Peter Parsons Birthdate: 08-17-1960 Sex: male Admission Date (Current Location): 02/11/2019  [place] and [place] Number:  [name] [id number] [place] and Address:  [facility], [address], [place], [place] [address]      Provider Number: [id number]  Attending Physician Name and Address:  Enzo Bi, MD  Relative Name and Phone Number:  Norberto Sorenson guardian [phone]    Current Level of Care: Hospital Recommended Level of Care: Memory Care Prior Approval Number:    Date Approved/Denied:   PASRR Number:    Discharge Plan: Other (Comment)(Memory Care)    Current Diagnoses: Patient Active Problem List   Diagnosis Date Noted  . Acute metabolic encephalopathy [id number]  . Protein-calorie malnutrition, severe 03/29/2019  . Weakness 03/27/2019  . Dementia ([place]) 03/19/2019  . Altered mental status   . Paranoid schizophrenia ([place])     Orientation RESPIRATION BLADDER Height & Weight     Self, Time, Situation  Normal Continent Weight: 156 lb 1.4 oz (70.8 kg) Height:  5\' 9"  (175.3 cm)  BEHAVIORAL SYMPTOMS/MOOD NEUROLOGICAL BOWEL NUTRITION STATUS  Wanderer   Continent Diet(Mechanical soft diet thin liquids)  AMBULATORY STATUS COMMUNICATION OF NEEDS Skin   Supervision Verbally Normal                       Personal Care Assistance Level of Assistance  Bathing, Dressing Bathing Assistance: Limited assistance   Dressing Assistance: Limited assistance     Functional Limitations Info  Speech     Speech Info: Impaired    SPECIAL CARE FACTORS FREQUENCY                       Contractures Contractures Info: Not present    Additional Factors Info  Code Status, Allergies Code Status Info: Full Code Allergies Info: Acetaminophen           Current Medications (04/01/2019):  This is the current hospital active medication  list Current Facility-Administered Medications  Medication Dose Route Frequency Provider Last Rate Last Admin  . atorvastatin (LIPITOR) tablet 10 mg  10 mg Oral Daily Carrie Mew, MD   10 mg at 04/01/19 0917  . chlorhexidine (PERIDEX) 0.12 % solution 15 mL  15 mL Mouth/Throat BID Lavina Hamman, MD   15 mL at 04/01/19 0917  . clonazePAM (KLONOPIN) tablet 0.25 mg  0.25 mg Oral TID PRN Dixie Dials, MD   0.25 mg at 03/31/19 2257  . dextrose 50 % solution 50 mL  1 ampule Intravenous PRN Lavina Hamman, MD      . enoxaparin (LOVENOX) injection 40 mg  40 mg Subcutaneous Q24H Lavina Hamman, MD   40 mg at 04/01/19 0645  . escitalopram (LEXAPRO) tablet 10 mg  10 mg Oral Daily Cristofano, Dorene Ar, MD   10 mg at 04/01/19 0917  . feeding supplement (NEPRO CARB STEADY) liquid 237 mL  237 mL Oral TID BM Lavina Hamman, MD   237 mL at 04/01/19 0917  . gabapentin (NEURONTIN) capsule 300 mg  300 mg Oral TID Carrie Mew, MD   300 mg at 04/01/19 0917  . insulin aspart (novoLOG) injection 0-9 Units  0-9 Units Subcutaneous TID PC & HS Mansy, Arvella Merles, MD   1 Units at 03/31/19 1747  . multivitamin with minerals tablet 1 tablet  1 tablet Oral Daily Cristofano, Dorene Ar, MD  1 tablet at 04/01/19 0917  . prazosin (MINIPRESS) capsule 2 mg  2 mg Oral QHS Johnn Hai, MD   2 mg at 03/31/19 2300  . risperiDONE (RISPERDAL) tablet 3 mg  3 mg Oral BID Carrie Mew, MD   3 mg at 04/01/19 [id number]  . senna-docusate (Senokot-S) tablet 1 tablet  1 tablet Oral QHS PRN Mansy, Jan A, MD      . tamsulosin (FLOMAX) capsule 0.4 mg  0.4 mg Oral Daily Carrie Mew, MD   0.4 mg at 04/01/19 0917  . traZODone (DESYREL) tablet 150 mg  150 mg Oral QHS Carrie Mew, MD   150 mg at 03/31/19 2256     Discharge Medications: Please see discharge summary for a list of discharge medications.  Relevant Imaging Results:  Relevant Lab Results:   Additional Information SSN: [id number]  Kolyn Rozario, Gardiner Rhyme,  LCSW

## 2019-04-01 NOTE — Progress Notes (Signed)
[facility] Progress Note  Patient: Peter Parsons    [id number]  DOA: 02/11/2019     Date of Service: the patient was seen and examined on 04/01/2019  Chief Complaint  Patient presents with  . ivc   Brief hospital course: Initially presented on 02/11/2019 from group home as he has suicidal ideation and was wandering off in the woods.  Prior to this patient had multiple ER visits with injuries after he ran out of his group home.  Patient was admitted at behavioral health for suicidal ideation in November 2020. Initial psychiatric evaluation recommended inpatient psychiatric admission at [facility]. On 02/18/2019 psychiatric recommended patient is cleared from psychiatric point of view and now patient requires a locked memory care unit. While awaiting for placement in ER, EDP noted increasing confusion, generalized weakness and dysphagia and patient was referred for further work-up for admission. MRI brain negative for any acute stroke. Speech evaluation pending. Currently further plan is complete further work-up and consult psychiatry as well as TOC team for assistance.  Assessment and Plan: 1.  Dysphagia, dysarthria, left facial droop CVA ruled out with MRI. Currently still has some dysphagia. Potentially medication related. Speech therapy evaluation pending. PT recommends supervision for mobility. Psychiatric team initially recommended locked memory care unit.  2.  Depression, anxiety, bipolar disorder and schizophrenia. Frequent suicidal ideation. Currently IVC. Patient definitely does not have any capacity to make medical decisions. Patient is from a group home. Continue Lexapro Risperdal  Continue prazosin as well. --Hold Klonopin due to it causing extreme somnolence   3.  Type 2 diabetes mellitus uncontrolled with chronic kidney disease stage IIIa as well as peripheral neuropathy. Hold Metformin. Monitor renal function. Avoid nephrotoxic medications.  Continue sliding scale insulin but avoid long-acting insulin as the patient is hypoglycemic.  4.  BPH Continue Flomax  5.  Dyslipidemia Continue statin  6.  Essential hypertension Patient is on lisinopril for renal protective effect but we will discontinue that given his potassium is mildly elevated as well as renal function is elevated. Monitor blood pressure for now.  7.  B12 deficiency.  Folate deficiency We will replace with subcu B12 and oral replacement.  Severe Malnutrition related to social / environmental circumstances(inadequate oral intake) as evidenced by 21.6% weight loss over the past 2-3 months, moderate fat depletion, moderate-severe muscle depletion.  # No need for daily labs   Diet: DYS 3 DVT Prophylaxis: Subcutaneous Heparin    Advance goals of care discussion: Full code  Family Communication: Not today.  Patient has a legal guardian.  Disposition:  Pt is from group home, admitted with dysarthria and dysphagia, originally brought in for suicidal ideation and currently IVC, still has confusion due to his schizophrenia, which precludes a safe discharge.  TOC team is working to identify locked memory care unit.  FL 2 send out on 02/26/2019. Discharge to memory care unit, when bed available.  Subjective:  Pt continues to mentioning wanting to harm himself.  No fever, pain, N/V/D, increased swelling.   Physical Exam: Constitutional: NAD, alert, oriented to self HEENT: conjunctivae and lids normal, EOMI CV: RRR no M,R,G. Distal pulses +2.  No cyanosis.   RESP: CTA B/L, normal respiratory effort  GI: +BS, NTND Extremities: No effusions, edema, or tenderness in BLE SKIN: warm, dry and intact Neuro: II - XII grossly intact.  Sensation intact Psych: Depressed mood and affect.     Vitals:   03/31/19 2131 03/31/19 2341 04/01/19 0835 04/01/19 1615  BP: 128/81 115/62  125/82 126/80  Pulse: 65 70 79 88  Resp: 16 16 18 18   Temp: 98.1 F (36.7 C) 98.1 F (36.7  C) 98.1 F (36.7 C) 98.2 F (36.8 C)  TempSrc: Oral Oral Oral Oral  SpO2: 99% 99% 97% 98%  Weight:      Height:        Intake/Output Summary (Last 24 hours) at 04/01/2019 1623 Last data filed at 04/01/2019 1617 Gross per 24 hour  Intake 840 ml  Output 601 ml  Net 239 ml   Filed Weights   02/11/19 1441 03/28/19 0306  Weight: 77.1 kg 70.8 kg    Data Reviewed: I have personally reviewed and interpreted daily labs, tele strips, imagings as discussed above. I reviewed all nursing notes, pharmacy notes, vitals, pertinent old records   CBC: Recent Labs  Lab 03/27/19 2152 03/29/19 0757 03/30/19 0404 03/31/19 0446  WBC 5.4 4.3 4.8 5.4  NEUTROABS 3.5  --  2.1  --   HGB 11.3* 10.2* 10.3* 10.8*  HCT 31.9* 28.9* 28.6* 30.2*  MCV 83.9 85.8 84.6 83.9  PLT 147* 131* 152 [id number]*   Basic Metabolic Panel: Recent Labs  Lab 03/27/19 2152 03/29/19 0757 03/30/19 0404 03/31/19 0446  NA 142 139 142 142  K 4.5 4.0 3.8 3.8  CL 102 102 103 102  CO2 29 28 31 31   GLUCOSE 93 89 80 98  BUN 30* 24* 17 18  CREATININE 1.86* 1.28* 1.07 1.11  CALCIUM 9.4 9.0 9.0 9.2  MG  --   --  1.6* 1.8    Studies: No results found.  Scheduled Meds: . atorvastatin  10 mg Oral Daily  . chlorhexidine  15 mL Mouth/Throat BID  . enoxaparin (LOVENOX) injection  40 mg Subcutaneous Q24H  . escitalopram  10 mg Oral Daily  . feeding supplement (NEPRO CARB STEADY)  237 mL Oral TID BM  . gabapentin  300 mg Oral TID  . insulin aspart  0-9 Units Subcutaneous TID PC & HS  . multivitamin with minerals  1 tablet Oral Daily  . prazosin  2 mg Oral QHS  . risperiDONE  3 mg Oral BID  . tamsulosin  0.4 mg Oral Daily  . traZODone  150 mg Oral QHS   Continuous Infusions:  PRN Meds: clonazePAM, dextrose, senna-docusate  Enzo Bi, MD  [facility] 04/01/2019 4:23 PM  To reach On-call, see care teams to locate the attending and reach out to them via [email or web address]. If 7PM-7AM, please contact night-coverage If  you still have difficulty reaching the attending provider, please page the [facility] (Director on Call) for [facility] on amion for assistance.

## 2019-04-02 DIAGNOSIS — F200 Paranoid schizophrenia: Secondary | ICD-10-CM | POA: Diagnosis not present

## 2019-04-02 LAB — GLUCOSE, CAPILLARY
Glucose-Capillary: 88 mg/dL (ref 70–99)
Glucose-Capillary: 143 mg/dL — ABNORMAL HIGH (ref 70–99)
Glucose-Capillary: 130 mg/dL — ABNORMAL HIGH (ref 70–99)
Glucose-Capillary: 85 mg/dL (ref 70–99)

## 2019-04-02 LAB — VITAMIN B1: Vitamin B1 (Thiamine): 91.6 nmol/L (ref 66.5–200.0)

## 2019-04-02 NOTE — Plan of Care (Signed)
  Problem: Education: Goal: Knowledge of General Education information will improve Description: Including pain rating scale, medication(s)/side effects and non-pharmacologic comfort measures Outcome: Progressing   Problem: Activity: Goal: Risk for activity intolerance will decrease Outcome: Progressing   Problem: Nutrition: Goal: Adequate nutrition will be maintained Outcome: Progressing   Problem: Pain Managment: Goal: General experience of comfort will improve Outcome: Progressing   Problem: Safety: Goal: Ability to remain free from injury will improve Outcome: Progressing   Problem: Skin Integrity: Goal: Risk for impaired skin integrity will decrease Outcome: Progressing   Problem: Education: Goal: Knowledge of disease or condition will improve Outcome: Progressing Goal: Knowledge of secondary prevention will improve Outcome: Progressing Goal: Knowledge of patient specific risk factors addressed and post discharge goals established will improve Outcome: Progressing   Problem: Nutrition: Goal: Risk of aspiration will decrease Outcome: Progressing   Problem: Intracerebral Hemorrhage Tissue Perfusion: Goal: Complications of Intracerebral Hemorrhage will be minimized Outcome: Progressing   Problem: Ischemic Stroke/TIA Tissue Perfusion: Goal: Complications of ischemic stroke/TIA will be minimized Outcome: Progressing

## 2019-04-02 NOTE — Progress Notes (Signed)
[facility] Progress Note  Patient: Peter Parsons    [id number]  DOA: 02/11/2019     Date of Service: the patient was seen and examined on 04/02/2019  Chief Complaint  Patient presents with  . ivc   Brief hospital course: Initially presented on 02/11/2019 from group home as he has suicidal ideation and was wandering off in the woods.  Prior to this patient had multiple ER visits with injuries after he ran out of his group home.  Patient was admitted at behavioral health for suicidal ideation in November 2020. Initial psychiatric evaluation recommended inpatient psychiatric admission at [facility]. On 02/18/2019 psychiatric recommended patient is cleared from psychiatric point of view and now patient requires a locked memory care unit. While awaiting for placement in ER, EDP noted increasing confusion, generalized weakness and dysphagia and patient was referred for further work-up for admission. MRI brain negative for any acute stroke. Speech evaluation pending. Currently further plan is complete further work-up and consult psychiatry as well as TOC team for assistance.  Assessment and Plan: 1.  Dysphagia, dysarthria, left facial droop CVA ruled out with MRI. Currently still has some dysphagia. Potentially medication related. Speech therapy evaluation pending. PT recommends supervision for mobility. Psychiatric team initially recommended locked memory care unit.  2.  Depression, anxiety, bipolar disorder and schizophrenia. Frequent suicidal ideation. Currently IVC. Patient definitely does not have any capacity to make medical decisions. Patient is from a group home. Continue Lexapro Risperdal  Continue prazosin as well. --Hold Klonopin due to it causing extreme somnolence   3.  Type 2 diabetes mellitus uncontrolled with chronic kidney disease stage IIIa as well as peripheral neuropathy. Hold Metformin. Monitor renal function. Avoid nephrotoxic  medications. Continue sliding scale insulin but avoid long-acting insulin as the patient is hypoglycemic.  4.  BPH Continue Flomax  5.  Dyslipidemia Continue statin  6.  Essential hypertension Patient is on lisinopril for renal protective effect but we will discontinue that given his potassium is mildly elevated as well as renal function is elevated. Monitor blood pressure for now.  7.  B12 deficiency.  Folate deficiency We will replace with subcu B12 and oral replacement.  Severe Malnutrition related to social / environmental circumstances(inadequate oral intake) as evidenced by 21.6% weight loss over the past 2-3 months, moderate fat depletion, moderate-severe muscle depletion.  # No need for daily labs   Diet: DYS 3 DVT Prophylaxis: Subcutaneous Heparin    Advance goals of care discussion: Full code  Family Communication: Not today.  Patient has a legal guardian.  Disposition:  Pt is from group home, admitted with dysarthria and dysphagia, originally brought in for suicidal ideation and currently IVC, still has confusion due to his schizophrenia, which precludes a safe discharge.  TOC team is working to identify locked memory care unit.  FL 2 send out on 02/26/2019. Discharge to memory care unit, when bed available.  Subjective:  Sitter at bedside said pt seemed in better mood today.  SLP changed diet to Puree due to pt's tendency to pocket solids.   Physical Exam: Constitutional: NAD, alert, oriented to self HEENT: conjunctivae and lids normal, EOMI CV: RRR no M,R,G. Distal pulses +2.  No cyanosis.   RESP: CTA B/L, normal respiratory effort  GI: +BS, NTND Extremities: No effusions, edema, or tenderness in BLE SKIN: warm, dry and intact Neuro: II - XII grossly intact.  Sensation intact Psych: flat mood and affect.     Vitals:   04/01/19 2350 04/02/19 0402  04/02/19 0807 04/02/19 1635  BP: 111/70 100/73 106/72 (!) 148/81  Pulse: 76 61 64 (!) 126  Resp: 18 17 18 20    Temp: 98.7 F (37.1 C) 98.8 F (37.1 C) 98.2 F (36.8 C) 98 F (36.7 C)  TempSrc: Oral  Oral Oral  SpO2: 96% 97% 97% 97%  Weight:      Height:        Intake/Output Summary (Last 24 hours) at 04/02/2019 1843 Last data filed at 04/02/2019 1700 Gross per 24 hour  Intake 1410 ml  Output 700 ml  Net 710 ml   Filed Weights   02/11/19 1441 03/28/19 0306  Weight: 77.1 kg 70.8 kg    Data Reviewed: I have personally reviewed and interpreted daily labs, tele strips, imagings as discussed above. I reviewed all nursing notes, pharmacy notes, vitals, pertinent old records   CBC: Recent Labs  Lab 03/27/19 2152 03/29/19 0757 03/30/19 0404 03/31/19 0446  WBC 5.4 4.3 4.8 5.4  NEUTROABS 3.5  --  2.1  --   HGB 11.3* 10.2* 10.3* 10.8*  HCT 31.9* 28.9* 28.6* 30.2*  MCV 83.9 85.8 84.6 83.9  PLT 147* 131* 152 123456*   Basic Metabolic Panel: Recent Labs  Lab 03/27/19 2152 03/29/19 0757 03/30/19 0404 03/31/19 0446  NA 142 139 142 142  K 4.5 4.0 3.8 3.8  CL 102 102 103 102  CO2 29 28 31 31   GLUCOSE 93 89 80 98  BUN 30* 24* 17 18  CREATININE 1.86* 1.28* 1.07 1.11  CALCIUM 9.4 9.0 9.0 9.2  MG  --   --  1.6* 1.8    Studies: No results found.  Scheduled Meds: . atorvastatin  10 mg Oral Daily  . chlorhexidine  15 mL Mouth/Throat BID  . enoxaparin (LOVENOX) injection  40 mg Subcutaneous Q24H  . escitalopram  10 mg Oral Daily  . feeding supplement (NEPRO CARB STEADY)  237 mL Oral TID BM  . gabapentin  300 mg Oral TID  . insulin aspart  0-9 Units Subcutaneous TID PC & HS  . multivitamin with minerals  1 tablet Oral Daily  . prazosin  2 mg Oral QHS  . risperiDONE  3 mg Oral BID  . tamsulosin  0.4 mg Oral Daily  . traZODone  150 mg Oral QHS   Continuous Infusions:  PRN Meds: clonazePAM, dextrose, senna-docusate  Enzo Bi, MD  [facility] 04/02/2019 6:43 PM  To reach On-call, see care teams to locate the attending and reach out to them via [email or web address]. If 7PM-7AM,  please contact night-coverage If you still have difficulty reaching the attending provider, please page the [facility] (Director on Call) for [facility] on amion for assistance.

## 2019-04-02 NOTE — Progress Notes (Signed)
Speech Language Pathology Treatment: Dysphagia  Patient Details Name: Peter Parsons MRN: [id number] DOB: January 06, 1961 Today's Date: 04/02/2019 Time: [id number] SLP Time Calculation (min) (ACUTE ONLY): 35 min  Assessment / Plan / Recommendation Clinical Impression  Pt seen for ongoing assessment of swallowing; toleration of oral diet(increased textures in Minced diet) and safety w/ oral intake. Pt continues to have a Sitter present; Psychiatry is following. Per MD note, Benzo meds were d/c'ed 03/31/2019 w/ min more alertness. Pt continues to have still has confusion due to his schizophrenia per MD notes; oral phase deficits have been noted by [place] staff including pocketing, oral holding.  Upon walking into room, pt had his lunch tray in front of him. He nodded he had been eating some of the lunch meal of Egg and Cheese Omelette(his request yesterday) - immediately noted Oral Pocketing of the increased textured foods of the egg and when pt did a finger sweep, egg bolus was cleared from buccal area. Oral cavity cleared and pt given trials of puree and thin liquids, including his Supplement drink. Pt fed self consuming trials of thin liquids via Straw and few boluses of purees w/ no immediate, overt clinical s/s of aspiration; no decline in vocal quality or respiratory status noted during/post trials. However, noted min slower oral/lingual management and A-P transfer of the puree foods lengthening oral phase time w/ the bolus trials. Educated and instructed pt on general aspiration precautions to include smaller sips, slowly and to take his tongue/finger to sweep around his mouth to fully clear it of any foods during eating/meals. Practiced this w/ pt. Pt is easily distracted and min impulsive w/ self-drinking of liquids -- requires redirection and monitoring during all oral intake for follow through and safety. Suspect oral phase deficits are directly related to his Baseline dxs of Cognitive status decline:  Schizophrenia, Dementia, as per chart notes.  Recommend modifying diet further to d/t Mental/Cognitive decline -- Dysphagia level 1(pureed) w/ thin liquids; general aspiration precautions; feeding support at meals w/ cues redirect attention to oral clearing during eating/drinking. Pills in Puree - Crushed for safer swallowing d/t Cognitive status, illness. ST services will continue to f/u w/ pt's status and provide education as needed while admitted. Suspect this diet consistency may be beneficial at Discharge to help pt meet nutritional needs adequate, safely. Recommend Dietician f/u for support also. Will f/u w/ MD re: potential of Medication impact.      HPI HPI: Pt is 59 y/o M with PMH including: T2DM, HTN, Dementia per chart, and paranoid schizophrenia who had been on the BHU 44 days prior to this encounter. Pt was admitted to the [facility] from his [place] after wandering into the woods near water. There were reports of suicidal ideations. Pt presents d/t the Acute floor d/t generalized weakness, suspicion of dysphagia, and slurred speech. There is concern for Medicaion effect per MD report. MRI negative for acute CVA.  Per chart, dx of Protein-calorie malnutrition, Severe.       SLP Plan  Continue with current plan of care       Recommendations  Diet recommendations: Dysphagia 1 (puree);Thin liquid(gravies) Liquids provided via: Cup;Straw Medication Administration: Crushed with puree(for safer swallowing; clearing) Supervision: Patient able to self feed;Staff to assist with self feeding;Full supervision/cueing for compensatory strategies Compensations: Minimize environmental distractions;Slow rate;Small sips/bites;Lingual sweep for clearance of pocketing;Multiple dry swallows after each bite/sip;Follow solids with liquid(Check for oral clearing) Postural Changes and/or Swallow Maneuvers: Seated upright 90 degrees;Upright 30-60 min after meal  General recommendations:  (Dietician f/u) Oral Care Recommendations: Oral care BID;Oral care before and after PO;Staff/trained caregiver to provide oral care Follow up Recommendations: None SLP Visit Diagnosis: Dysphagia, oral phase (R13.11)(impact from mental status decline) Plan: Continue with current plan of care       [place], [place], CCC-SLP Lindie Roberson 04/02/2019, 3:05 PM

## 2019-04-03 DIAGNOSIS — F200 Paranoid schizophrenia: Secondary | ICD-10-CM | POA: Diagnosis not present

## 2019-04-03 LAB — GLUCOSE, CAPILLARY
Glucose-Capillary: 84 mg/dL (ref 70–99)
Glucose-Capillary: 82 mg/dL (ref 70–99)
Glucose-Capillary: 121 mg/dL — ABNORMAL HIGH (ref 70–99)
Glucose-Capillary: 143 mg/dL — ABNORMAL HIGH (ref 70–99)
Glucose-Capillary: 96 mg/dL (ref 70–99)

## 2019-04-03 NOTE — Progress Notes (Signed)
[facility] Progress Note  Patient: Peter Parsons    [id number]  DOA: 02/11/2019     Date of Service: the patient was seen and examined on 04/03/2019  Chief Complaint  Patient presents with  . ivc   Brief hospital course: Initially presented on 02/11/2019 from group home as he has suicidal ideation and was wandering off in the woods.  Prior to this patient had multiple ER visits with injuries after he ran out of his group home.  Patient was admitted at behavioral health for suicidal ideation in November 2020. Initial psychiatric evaluation recommended inpatient psychiatric admission at [facility]. On 02/18/2019 psychiatric recommended patient is cleared from psychiatric point of view and now patient requires a locked memory care unit. While awaiting for placement in ER, EDP noted increasing confusion, generalized weakness and dysphagia and patient was referred for further work-up for admission. MRI brain negative for any acute stroke. Speech evaluation pending. Currently further plan is complete further work-up and consult psychiatry as well as TOC team for assistance.  Assessment and Plan: 1.  Dysphagia, dysarthria, left facial droop CVA ruled out with MRI. Currently still has some dysphagia. Potentially medication related. Speech therapy evaluation pending. PT recommends supervision for mobility. Psychiatric team initially recommended locked memory care unit.  2.  Depression, anxiety, bipolar disorder and schizophrenia. Frequent suicidal ideation. Currently IVC. Patient definitely does not have any capacity to make medical decisions. Patient is from a group home. Continue Lexapro Risperdal  Continue prazosin as well. --Hold Klonopin due to it causing extreme somnolence   3.  Type 2 diabetes mellitus uncontrolled with chronic kidney disease stage IIIa as well as peripheral neuropathy. Hold Metformin. Monitor renal function. Avoid nephrotoxic  medications. Continue sliding scale insulin but avoid long-acting insulin as the patient is hypoglycemic.  4.  BPH Continue Flomax  5.  Dyslipidemia Continue statin  6.  Essential hypertension Patient is on lisinopril for renal protective effect but we will discontinue that given his potassium is mildly elevated as well as renal function is elevated. Monitor blood pressure for now.  7.  B12 deficiency.  Folate deficiency We will replace with subcu B12 and oral replacement.  Severe Malnutrition related to social / environmental circumstances(inadequate oral intake) as evidenced by 21.6% weight loss over the past 2-3 months, moderate fat depletion, moderate-severe muscle depletion.  # No need for daily labs   Diet: DYS 3 DVT Prophylaxis: Subcutaneous Heparin    Advance goals of care discussion: Full code  Family Communication: Not today.  Patient has a legal guardian.  Disposition:  Pt is from group home, admitted with dysarthria and dysphagia, originally brought in for suicidal ideation and currently IVC, still has confusion due to his schizophrenia, which precludes a safe discharge.  TOC team is working to identify locked memory care unit.  FL 2 send out on 02/26/2019. Discharge to memory care unit, when bed available.  Subjective:  Sitter at bedside said pt was his pureed diet ok.  No issues.  Pt able to walk with contact assist.  No fever, dyspnea, pain, N/V/D.   Physical Exam: Constitutional: NAD, alert, oriented to self HEENT: conjunctivae and lids normal, EOMI, drooling from right corner of the mouth CV: RRR no M,R,G. Distal pulses +2.  No cyanosis.   RESP: CTA B/L, normal respiratory effort  GI: +BS, NTND Extremities: No effusions, edema, or tenderness in BLE SKIN: warm, dry and intact Neuro: II - XII grossly intact.  Sensation intact Psych: flat mood and affect.  Vitals:   04/02/19 2300 04/03/19 0550 04/03/19 0700 04/03/19 1200  BP: 98/63 109/72 111/75  100/73  Pulse: 91 83 96 90  Resp: 18  18 18   Temp: 99.1 F (37.3 C) 99 F (37.2 C) 98.8 F (37.1 C) 99.1 F (37.3 C)  TempSrc: Oral Axillary Oral Oral  SpO2: 97% 95% 96% 96%  Weight:      Height:        Intake/Output Summary (Last 24 hours) at 04/03/2019 1519 Last data filed at 04/03/2019 1231 Gross per 24 hour  Intake 930 ml  Output 800 ml  Net 130 ml   Filed Weights   02/11/19 1441 03/28/19 0306  Weight: 77.1 kg 70.8 kg    Data Reviewed: I have personally reviewed and interpreted daily labs, tele strips, imagings as discussed above. I reviewed all nursing notes, pharmacy notes, vitals, pertinent old records   CBC: Recent Labs  Lab 03/27/19 2152 03/29/19 0757 03/30/19 0404 03/31/19 0446  WBC 5.4 4.3 4.8 5.4  NEUTROABS 3.5  --  2.1  --   HGB 11.3* 10.2* 10.3* 10.8*  HCT 31.9* 28.9* 28.6* 30.2*  MCV 83.9 85.8 84.6 83.9  PLT 147* 131* 152 123456*   Basic Metabolic Panel: Recent Labs  Lab 03/27/19 2152 03/29/19 0757 03/30/19 0404 03/31/19 0446  NA 142 139 142 142  K 4.5 4.0 3.8 3.8  CL 102 102 103 102  CO2 29 28 31 31   GLUCOSE 93 89 80 98  BUN 30* 24* 17 18  CREATININE 1.86* 1.28* 1.07 1.11  CALCIUM 9.4 9.0 9.0 9.2  MG  --   --  1.6* 1.8    Studies: No results found.  Scheduled Meds: . atorvastatin  10 mg Oral Daily  . chlorhexidine  15 mL Mouth/Throat BID  . enoxaparin (LOVENOX) injection  40 mg Subcutaneous Q24H  . escitalopram  10 mg Oral Daily  . feeding supplement (NEPRO CARB STEADY)  237 mL Oral TID BM  . gabapentin  300 mg Oral TID  . insulin aspart  0-9 Units Subcutaneous TID PC & HS  . multivitamin with minerals  1 tablet Oral Daily  . prazosin  2 mg Oral QHS  . risperiDONE  3 mg Oral BID  . tamsulosin  0.4 mg Oral Daily  . traZODone  150 mg Oral QHS   Continuous Infusions:  PRN Meds: clonazePAM, dextrose, senna-docusate  Enzo Bi, MD  [facility] 04/03/2019 3:19 PM  To reach On-call, see care teams to locate the attending  and reach out to them via [email or web address]. If 7PM-7AM, please contact night-coverage If you still have difficulty reaching the attending provider, please page the [facility] (Director on Call) for [facility] on amion for assistance.

## 2019-04-04 DIAGNOSIS — F200 Paranoid schizophrenia: Secondary | ICD-10-CM | POA: Diagnosis not present

## 2019-04-04 LAB — GLUCOSE, CAPILLARY
Glucose-Capillary: 107 mg/dL — ABNORMAL HIGH (ref 70–99)
Glucose-Capillary: 134 mg/dL — ABNORMAL HIGH (ref 70–99)
Glucose-Capillary: 97 mg/dL (ref 70–99)
Glucose-Capillary: 91 mg/dL (ref 70–99)

## 2019-04-04 NOTE — Progress Notes (Signed)
[facility] Progress Note  Patient: Peter Parsons    [id number]  DOA: 02/11/2019     Date of Service: the patient was seen and examined on 04/04/2019  Chief Complaint  Patient presents with  . ivc   Brief hospital course: Initially presented on 02/11/2019 from group home as he has suicidal ideation and was wandering off in the woods.  Prior to this patient had multiple ER visits with injuries after he ran out of his group home.  Patient was admitted at behavioral health for suicidal ideation in November 2020. Initial psychiatric evaluation recommended inpatient psychiatric admission at [facility]. On 02/18/2019 psychiatric recommended patient is cleared from psychiatric point of view and now patient requires a locked memory care unit. While awaiting for placement in ER, EDP noted increasing confusion, generalized weakness and dysphagia and patient was referred for further work-up for admission. MRI brain negative for any acute stroke. Speech evaluation pending. Currently further plan is complete further work-up and consult psychiatry as well as TOC team for assistance.  Assessment and Plan: 1.  Dysphagia, dysarthria, left facial droop CVA ruled out with MRI. Currently still has some dysphagia. Potentially medication related. Speech therapy evaluation pending. PT recommends supervision for mobility. Psychiatric team initially recommended locked memory care unit.  2.  Depression, anxiety, bipolar disorder and schizophrenia. Frequent suicidal ideation. Currently IVC. Patient definitely does not have any capacity to make medical decisions. Patient is from a group home. Continue Lexapro Risperdal  Continue prazosin as well. --Hold Klonopin due to it causing extreme somnolence   3.  Type 2 diabetes mellitus uncontrolled with chronic kidney disease stage IIIa as well as peripheral neuropathy. Hold Metformin. Monitor renal function. Avoid nephrotoxic  medications. Continue sliding scale insulin but avoid long-acting insulin as the patient is hypoglycemic.  4.  BPH Continue Flomax  5.  Dyslipidemia Continue statin  6.  Essential hypertension Patient is on lisinopril for renal protective effect but we will discontinue that given his potassium is mildly elevated as well as renal function is elevated. Monitor blood pressure for now.  7.  B12 deficiency.  Folate deficiency We will replace with subcu B12 and oral replacement.  Severe Malnutrition related to social / environmental circumstances(inadequate oral intake) as evidenced by 21.6% weight loss over the past 2-3 months, moderate fat depletion, moderate-severe muscle depletion.  # No need for daily labs   Diet: DYS 3 DVT Prophylaxis: Subcutaneous Heparin    Advance goals of care discussion: Full code  Family Communication: Not today.  Patient has a legal guardian.  Disposition:  Pt is from group home, admitted with dysarthria and dysphagia, originally brought in for suicidal ideation and currently IVC, still has confusion due to his schizophrenia, which precludes a safe discharge.  TOC team is working to identify locked memory care unit.  FL 2 send out on 02/26/2019. Discharge to memory care unit, when bed available.  Subjective:  Sitter reported pt drinking his Ensures, but didn't eat much foods.  Pt reported feeling sleepy, and has been sleeping a lot, both night and day time. No fever, dyspnea, chest pain, abdominal pain, N/V/D.   Physical Exam: Constitutional: NAD, alert, oriented to self, sleepy-looking, not very responsive HEENT: conjunctivae and lids normal, EOMI, drooling from right corner of the mouth CV: RRR no M,R,G. Distal pulses +2.  No cyanosis.   RESP: CTA B/L, normal respiratory effort  GI: +BS, NTND Extremities: No effusions, edema, or tenderness in BLE SKIN: warm, dry and intact Neuro: II -  XII grossly intact.  Sensation intact Psych: flat mood and  affect.     Vitals:   04/04/19 0005 04/04/19 0505 04/04/19 0756 04/04/19 1138  BP: 104/70 112/73 117/77 104/69  Pulse: 80 74 82 88  Resp: 18 20 18 18   Temp: (!) 97.5 F (36.4 C) 99.7 F (37.6 C) 99.6 F (37.6 C) 98.2 F (36.8 C)  TempSrc:      SpO2: 95% 96% 96% 97%  Weight:      Height:        Intake/Output Summary (Last 24 hours) at 04/04/2019 1338 Last data filed at 04/04/2019 0500 Gross per 24 hour  Intake 240 ml  Output 800 ml  Net -560 ml   Filed Weights   02/11/19 1441 03/28/19 0306  Weight: 77.1 kg 70.8 kg    Data Reviewed: I have personally reviewed and interpreted daily labs, tele strips, imagings as discussed above. I reviewed all nursing notes, pharmacy notes, vitals, pertinent old records   CBC: Recent Labs  Lab 03/29/19 0757 03/30/19 0404 03/31/19 0446  WBC 4.3 4.8 5.4  NEUTROABS  --  2.1  --   HGB 10.2* 10.3* 10.8*  HCT 28.9* 28.6* 30.2*  MCV 85.8 84.6 83.9  PLT 131* 152 123456*   Basic Metabolic Panel: Recent Labs  Lab 03/29/19 0757 03/30/19 0404 03/31/19 0446  NA 139 142 142  K 4.0 3.8 3.8  CL 102 103 102  CO2 28 31 31   GLUCOSE 89 80 98  BUN 24* 17 18  CREATININE 1.28* 1.07 1.11  CALCIUM 9.0 9.0 9.2  MG  --  1.6* 1.8    Studies: No results found.  Scheduled Meds: . atorvastatin  10 mg Oral Daily  . chlorhexidine  15 mL Mouth/Throat BID  . enoxaparin (LOVENOX) injection  40 mg Subcutaneous Q24H  . escitalopram  10 mg Oral Daily  . feeding supplement (NEPRO CARB STEADY)  237 mL Oral TID BM  . gabapentin  300 mg Oral TID  . insulin aspart  0-9 Units Subcutaneous TID PC & HS  . multivitamin with minerals  1 tablet Oral Daily  . prazosin  2 mg Oral QHS  . risperiDONE  3 mg Oral BID  . tamsulosin  0.4 mg Oral Daily  . traZODone  150 mg Oral QHS   Continuous Infusions:  PRN Meds: clonazePAM, dextrose, senna-docusate  Enzo Bi, MD  [facility] 04/04/2019 1:38 PM  To reach On-call, see care teams to locate the  attending and reach out to them via [email or web address]. If 7PM-7AM, please contact night-coverage If you still have difficulty reaching the attending provider, please page the [facility] (Director on Call) for [facility] on amion for assistance.

## 2019-04-05 DIAGNOSIS — F200 Paranoid schizophrenia: Secondary | ICD-10-CM | POA: Diagnosis not present

## 2019-04-05 MED ORDER — MIRTAZAPINE 15 MG PO TABS
15.0000 mg | ORAL_TABLET | Freq: Every day | ORAL | Status: DC
  Administered 2019-04-05 – 2019-04-18 (×14): 15 mg via ORAL
  Filled 2019-04-05 (×14): qty 1

## 2019-04-05 MED ORDER — PRO-STAT SUGAR FREE PO LIQD
30.0000 mL | Freq: Two times a day (BID) | ORAL | Status: DC
  Administered 2019-04-05 – 2019-05-04 (×52): 30 mL via ORAL

## 2019-04-05 NOTE — Progress Notes (Signed)
Occupational Therapy Treatment Patient Details Name: Adiel Horng MRN: [id number] DOB: 1960/07/21 Today's Date: 04/05/2019    History of present illness Pt is 59 y/o M with PMH including: T2DM, HTN, and schizophrenia who had been on the BHU 44 days prior to this encounter. Pt presents d/t generalized weakness, dysphagia, and slurred speech. MRI negative for acute CVA.   OT comments  Attempted to see patient but he was being fed by CNA who asked for him to finish before he participated in therapy sessions.  Will attempt again later today time permitting or tomorrow.  Follow Up Recommendations       Equipment Recommendations       Recommendations for Other Services      Precautions / Restrictions Precautions Precautions: Fall Precaution Comments: Suicide precautions; IVC Restrictions Weight Bearing Restrictions: No       Mobility Bed Mobility                  Transfers                      Balance                                           ADL either performed or assessed with clinical judgement   ADL                                               Vision       Perception     Praxis      Cognition                                                Exercises     Shoulder Instructions       General Comments      Pertinent Vitals/ Pain          Home Living                                          Prior Functioning/Environment              Frequency           Progress Toward Goals  OT Goals(current goals can now be found in the care plan section)        Plan      Co-evaluation                 AM-PAC OT "6 Clicks" Daily Activity     Outcome Measure                    End of Session        Activity Tolerance     Patient Left     Nurse Communication          Time:  -     Charges:     Phoebe Perch,  [place] [phone] 04/05/19, 12:04 PM

## 2019-04-05 NOTE — Progress Notes (Signed)
Nutrition Follow-up  DOCUMENTATION CODES:   Severe malnutrition in context of social or environmental circumstances  INTERVENTION:  Initiate 48 hour calorie count starting 2/23 at breakfast.  Continue Nepro Shake po TID between meals, each supplement provides 425 kcal and 19 grams protein. Patient prefers butter pecan. He can also have Ensure Enlive if he prefers but he had requested butter pecan Nepro.  Provide Pro-Stat 30 mL po BID with meals, each supplement provides 100 kcal and 15 grams of protein.  Continue daily MVI.  Consider addition of appetite stimulant.  NUTRITION DIAGNOSIS:   Severe Malnutrition related to social / environmental circumstances(inadequate oral intake) as evidenced by percent weight loss, moderate fat depletion, moderate muscle depletion, severe muscle depletion.  Ongoing.  GOAL:   Patient will meet greater than or equal to 90% of their needs  Not progressing.  MONITOR:   PO intake, Supplement acceptance, Labs, Weight trends, I & O's  REASON FOR ASSESSMENT:   Malnutrition Screening Tool    ASSESSMENT:   59 year old male with PMHx of schizophrenia, HTN, DM who has been in [place] for 44 days and is now admitted with generalized weakness, dysphagia, slurred speech, left facial droop to rule out CVA.  Diet was originally dysphagia 3 with thin on 2/15. It was downgraded to dysphagia 2 with thin on 2/18 and then dysphagia 1 with thin on 2/19. Met with patient in his room but he was very sleepy and unable to provide any history. Safety sitter reports poor PO intake of meals. He had 0% of breakfast and only bites of lunch (bites of puree meat, bites of chocolate pudding, bites of Magic Cup). He had been given a chocolate Ensure but had only taken a few sips at time of RD assessment.  Medications reviewed and include: gabapentin, MVI daily, Flomax.  Labs reviewed: CBG 91-134.  Diet Order:   Diet Order            DIET - DYS 1 Room service appropriate?  Yes with Assist; Fluid consistency: Thin  Diet effective now             EDUCATION NEEDS:   No education needs have been identified at this time  Skin:  Skin Assessment: Reviewed RN Assessment  Last BM:  04/05/2019 large type 7  Height:   Ht Readings from Last 1 Encounters:  03/28/19 5' 9"  (1.753 m)   Weight:   Wt Readings from Last 1 Encounters:  03/28/19 70.8 kg   Ideal Body Weight:  72.7 kg  BMI:  Body mass index is 23.05 kg/m.  Estimated Nutritional Needs:   Kcal:  1800-2000  Protein:  90-100 grams  Fluid:  1.8-2 L/day  Jacklynn Barnacle, MS, RD, LDN Pager number available on Amion

## 2019-04-05 NOTE — TOC Progression Note (Addendum)
[facility]) - Progression Note    Patient Details  Name: Peter Parsons MRN: [id number] Date of Birth: 1960-03-08  [facility] CM/SW Contact  Dayana Dalporto, Gardiner Rhyme, LCSW Phone Number: 04/05/2019, 2:44 PM  Clinical Narrative:   Have faxed out pt's information to states dementia units. Will await return calls. Have reached out to [facility] due to have dementia unit also, to see if would consider him for this unit. Continue to work on obtaining a bed for this gentleman.  4;17 Pm-Spoke with [facility] regarding pt and he will have his staff evaluate and let me know if can take pt in their facility.       Expected Discharge Plan and Services                                                 Social Determinants of Health (SDOH) Interventions    Readmission Risk Interventions No flowsheet data found.

## 2019-04-05 NOTE — Progress Notes (Signed)
[facility] Progress Note  Patient: Peter Parsons    [id number]  DOA: 02/11/2019     Date of Service: the patient was seen and examined on 04/05/2019  Chief Complaint  Patient presents with  . ivc   Brief hospital course: Initially presented on 02/11/2019 from group home as he has suicidal ideation and was wandering off in the woods.  Prior to this patient had multiple ER visits with injuries after he ran out of his group home.  Patient was admitted at behavioral health for suicidal ideation in November 2020. Initial psychiatric evaluation recommended inpatient psychiatric admission at [facility]. On 02/18/2019 psychiatric recommended patient is cleared from psychiatric point of view and now patient requires a locked memory care unit. While awaiting for placement in ER, EDP noted increasing confusion, generalized weakness and dysphagia and patient was referred for further work-up for admission. MRI brain negative for any acute stroke. Speech evaluation pending. Currently further plan is complete further work-up and consult psychiatry as well as TOC team for assistance.  Assessment and Plan: 1.  Dysphagia, dysarthria, left facial droop CVA ruled out with MRI. Currently still has some dysphagia, pockets foods, but takes in fluids ok.  Able to ambulate with just contact guard.   2.  Depression, anxiety, bipolar disorder and schizophrenia. Frequent suicidal ideation. Currently IVC. Patient definitely does not have any capacity to make medical decisions, and has a guardian. Patient was from a group home. --Continue Lexapro Risperdal  --Continue prazosin as well. --Hold Klonopin due to it causing extreme somnolence   3.  Type 2 diabetes mellitus uncontrolled with chronic kidney disease stage IIIa as well as peripheral neuropathy. Hold Metformin. Monitor renal function. Avoid nephrotoxic medications. --d/c fingersticks as pt has not needed much SSI.  4.  BPH Continue  Flomax  5.  Dyslipidemia Continue statin  6.  Essential hypertension Patient was on lisinopril for renal protective effect but we discontinued that given his potassium is mildly elevated as well as renal function is elevated. Monitor blood pressure for now.  7.  B12 deficiency.  Folate deficiency --replace with subcu B12 and oral replacement.  Severe Malnutrition related to social / environmental circumstances(inadequate oral intake) as evidenced by 21.6% weight loss over the past 2-3 months, moderate fat depletion, moderate-severe muscle depletion. --Start Remeron due to poor appetite  # No need for daily labs   Diet: DYS 3 DVT Prophylaxis: Subcutaneous Heparin    Advance goals of care discussion: Full code  Family Communication: Not today.  Patient has a legal guardian.  Disposition:  Pt is from group home, admitted with dysarthria and dysphagia, originally brought in for suicidal ideation and currently IVC, still has confusion due to his schizophrenia, which precludes a safe discharge.  TOC team is working to identify locked memory care unit.  FL 2 send out on 02/26/2019. Discharge to memory care unit, when bed available.  Subjective:  Pt was in a bad mood today, and when I tried to ask him what was bothering him, he called me names.  Didn't continue asking more questions.    No noted fever, N/V/D.  Dietician concerned about pt's PO intake, so recommended appetite enhancer.     Physical Exam: Constitutional: NAD, alert, oriented to self HEENT: conjunctivae and lids normal, EOMI, drooling from right corner of the mouth CV: RRR no M,R,G. Distal pulses +2.  No cyanosis.   RESP: CTA B/L, normal respiratory effort  GI: +BS, NTND Extremities: No effusions, edema, or tenderness in  BLE SKIN: warm, dry and intact Neuro: II - XII grossly intact.  Sensation intact Psych: bad mood and affect.     Vitals:   04/05/19 0357 04/05/19 0746 04/05/19 1209 04/05/19 1526  BP: 109/70  108/68 118/74 103/70  Pulse: 70 72 (!) 110 68  Resp: 17 16 18 18   Temp: 99.1 F (37.3 C) 99 F (37.2 C) 98.3 F (36.8 C) 99.3 F (37.4 C)  TempSrc:  Axillary Axillary Axillary  SpO2: 96% 96% 96% 95%  Weight:      Height:        Intake/Output Summary (Last 24 hours) at 04/05/2019 1655 Last data filed at 04/05/2019 1300 Gross per 24 hour  Intake 0 ml  Output 650 ml  Net -650 ml   Filed Weights   02/11/19 1441 03/28/19 0306  Weight: 77.1 kg 70.8 kg    Data Reviewed: I have personally reviewed and interpreted daily labs, tele strips, imagings as discussed above. I reviewed all nursing notes, pharmacy notes, vitals, pertinent old records   CBC: Recent Labs  Lab 03/30/19 0404 03/31/19 0446  WBC 4.8 5.4  NEUTROABS 2.1  --   HGB 10.3* 10.8*  HCT 28.6* 30.2*  MCV 84.6 83.9  PLT 152 123456*   Basic Metabolic Panel: Recent Labs  Lab 03/30/19 0404 03/31/19 0446  NA 142 142  K 3.8 3.8  CL 103 102  CO2 31 31  GLUCOSE 80 98  BUN 17 18  CREATININE 1.07 1.11  CALCIUM 9.0 9.2  MG 1.6* 1.8    Studies: No results found.  Scheduled Meds: . atorvastatin  10 mg Oral Daily  . chlorhexidine  15 mL Mouth/Throat BID  . enoxaparin (LOVENOX) injection  40 mg Subcutaneous Q24H  . escitalopram  10 mg Oral Daily  . feeding supplement (NEPRO CARB STEADY)  237 mL Oral TID BM  . feeding supplement (PRO-STAT SUGAR FREE 64)  30 mL Oral BID WC  . gabapentin  300 mg Oral TID  . mirtazapine  15 mg Oral QHS  . multivitamin with minerals  1 tablet Oral Daily  . prazosin  2 mg Oral QHS  . risperiDONE  3 mg Oral BID  . tamsulosin  0.4 mg Oral Daily  . traZODone  150 mg Oral QHS   Continuous Infusions:  PRN Meds: clonazePAM, dextrose, senna-docusate  Enzo Bi, MD  [facility] 04/05/2019 4:55 PM  To reach On-call, see care teams to locate the attending and reach out to them via [email or web address]. If 7PM-7AM, please contact night-coverage If you still have difficulty reaching the  attending provider, please page the [facility] (Director on Call) for [facility] on amion for assistance.

## 2019-04-06 DIAGNOSIS — F200 Paranoid schizophrenia: Secondary | ICD-10-CM | POA: Diagnosis not present

## 2019-04-06 LAB — CBC
HCT: 32 % — ABNORMAL LOW (ref 39.0–52.0)
Hemoglobin: 11.2 g/dL — ABNORMAL LOW (ref 13.0–17.0)
MCH: 30.4 pg (ref 26.0–34.0)
MCHC: 35 g/dL (ref 30.0–36.0)
MCV: 87 fL (ref 80.0–100.0)
Platelets: 163 10*3/uL (ref 150–400)
RBC: 3.68 MIL/uL — ABNORMAL LOW (ref 4.22–5.81)
RDW: 13.2 % (ref 11.5–15.5)
WBC: 5.7 10*3/uL (ref 4.0–10.5)
nRBC: 0 % (ref 0.0–0.2)

## 2019-04-06 LAB — COMPREHENSIVE METABOLIC PANEL
ALT: 14 U/L (ref 0–44)
AST: 19 U/L (ref 15–41)
Albumin: 3.7 g/dL (ref 3.5–5.0)
Alkaline Phosphatase: 46 U/L (ref 38–126)
Anion gap: 10 (ref 5–15)
BUN: 44 mg/dL — ABNORMAL HIGH (ref 6–20)
CO2: 32 mmol/L (ref 22–32)
Calcium: 9.7 mg/dL (ref 8.9–10.3)
Chloride: 100 mmol/L (ref 98–111)
Creatinine, Ser: 1.19 mg/dL (ref 0.61–1.24)
GFR calc Af Amer: >60 mL/min (ref >60)
GFR calc non Af Amer: >60 mL/min (ref >60)
Glucose, Bld: 130 mg/dL — ABNORMAL HIGH (ref 70–99)
Potassium: 3.9 mmol/L (ref 3.5–5.1)
Sodium: 142 mmol/L (ref 135–145)
Total Bilirubin: 0.7 mg/dL (ref 0.3–1.2)
Total Protein: 7.4 g/dL (ref 6.5–8.1)

## 2019-04-06 LAB — GLUCOSE, CAPILLARY: Glucose-Capillary: 90 mg/dL (ref 70–99)

## 2019-04-06 LAB — MAGNESIUM: Magnesium: 1.8 mg/dL (ref 1.7–2.4)

## 2019-04-06 MED ORDER — SENNOSIDES-DOCUSATE SODIUM 8.6-50 MG PO TABS
1.0000 | ORAL_TABLET | Freq: Every day | ORAL | Status: DC
  Administered 2019-04-06 – 2019-05-18 (×43): 1 via ORAL
  Filled 2019-04-06 (×45): qty 1

## 2019-04-06 NOTE — Evaluation (Signed)
Physical Therapy Re-Evaluation Patient Details Name: Peter Parsons MRN: [id number] DOB: [birth year] Today's Date: 04/06/2019   History of Present Illness  Pt is a 59 y.o. male initially presenting to hospital 02/11/20 with IVC from group home d/t wandering off into woods multiple times (found near water); pt reporting suicidal thoughts of drowning himself.  Pt tripped on his own feet and hit chin on chair on morning of 2/4 (pt got back up and walked to restroom independently).  Unable to return to group home d/t safety concerns.  Per chart review pt scored 19/30 on [facility] cognitive assessment with psychiatry indicative of moderate cognitive impairment (consistent with pt's diagnosis of dementia).  Pt was in [facility] in ED for about 44 days prior to being admitted to hospital 2/13 for generalized weakness, dysphagia, and slurred speech.  MRI negative for acute CVA.  PMH includes DM, htn, schizophrenia.  Clinical Impression  Pt last seen by PT 2/14, mobilizing well, and discharged from PT in house.  New PT consult received 04/06/19 so PT re-evaluation performed.  Pt appearing lethargic upon therapist entering room requiring vc's and tactile cues to wake up and then pt able to participate in therapy (slow brief speech noted with increased time to respond; but inconsistent with responding).  Increased time required for pt to initiate movement with activities.  Flat affect noted.  Currently pt is mod to max assist for bed mobility; CGA to min assist for sitting balance d/t intermittent posterior lean; min to mod assist to initiate stand up to walker and for balance d/t posterior lean in standing with UE support on RW; and min assist to walk a few feet forward and backwards with RW (shuffling gait pattern noted).  Pt's HR increased from 90-91 bpm at rest to 131 bpm with activity; HR decreased back to 90-91 bpm at rest in bed end of session.  Pt appears to have had a decline in status (in terms of functional mobility)  since last seen by PT (anticipate d/t extended hospitalization with decreased mobility).  Pt would benefit from skilled PT to address noted impairments and functional limitations (see below for any additional details).  Upon hospital discharge, pt currently would benefit from STR (will monitor pt's status/progress for any updates to PT recommendations).    Follow Up Recommendations SNF    Equipment Recommendations  Rolling walker with 5" wheels;3in1 (PT)    Recommendations for Other Services       Precautions / Restrictions Precautions Precautions: Fall Precaution Comments: Suicide precautions; IVC; aspiration precautions Restrictions Weight Bearing Restrictions: No      Mobility  Bed Mobility Overal bed mobility: Needs Assistance Bed Mobility: Supine to Sit;Sit to Supine     Supine to sit: Mod assist;Max assist Sit to supine: Mod assist;Max assist;HOB elevated   General bed mobility comments: assist for trunk and B LE's semi-supine to/from sitting edge of bed; assist required to initiate movement; 2 assist to boost pt up in bed end of session using bed sheets  Transfers Overall transfer level: Needs assistance Equipment used: Rolling walker (2 wheeled) Transfers: Sit to/from Stand Sit to Stand: Min assist;Mod assist         General transfer comment: assist to initiate and come to full stand (min assist) up to walker but required mod assist for standing balance d/t posterior lean  Ambulation/Gait Ambulation/Gait assistance: Min assist Gait Distance (Feet): (3 feet forward and then backward) Assistive device: Rolling walker (2 wheeled)   Gait velocity: decreased   General Gait  Details: shuffling gait pattern; assist to shift weight forward d/t mild posterior lean with ambulation; limited distance d/t HR elevation  Stairs            Wheelchair Mobility    Modified Rankin (Stroke Patients Only)       Balance Overall balance assessment: Needs  assistance Sitting-balance support: Bilateral upper extremity supported;Feet supported Sitting balance-Leahy Scale: Poor Sitting balance - Comments: intermittent posterior lean requiring assist to shift weight forward (vc's and tactile cueing given) Postural control: Posterior lean Standing balance support: Bilateral upper extremity supported Standing balance-Leahy Scale: Poor Standing balance comment: posterior lean noted in standing (UE support on RW) requiring min to mod assist for balance                             Pertinent Vitals/Pain Pain Assessment: Faces Faces Pain Scale: No hurt Pain Intervention(s): Limited activity within patient's tolerance;Monitored during session;Repositioned  O2 sats WFL on room air during sessions activities.    Home Living Family/patient expects to be discharged to:: Group home(Unable to return d/t safety concerns per chart review)                      Prior Function Level of Independence: Independent         Comments: Ambulatory no AD     Hand Dominance        Extremity/Trunk Assessment   Upper Extremity Assessment Upper Extremity Assessment: Generalized weakness    Lower Extremity Assessment Lower Extremity Assessment: Generalized weakness    Cervical / Trunk Assessment Cervical / Trunk Assessment: Normal  Communication   Communication: (Increased time to vocalize; slower speech noted)  Cognition Arousal/Alertness: Lethargic Behavior During Therapy: Flat affect Overall Cognitive Status: Difficult to assess                                 General Comments: Slower speech; inconsistent with brief verbal responses      General Comments Nursing cleared pt for participation in physical therapy.  Pt agreeable to PT session.  Sitter present during session.    Exercises    Assessment/Plan    PT Assessment Patient needs continued PT services  PT Problem List Decreased strength;Decreased  activity tolerance;Decreased balance;Decreased mobility;Decreased knowledge of use of DME;Decreased knowledge of precautions;Cardiopulmonary status limiting activity       PT Treatment Interventions DME instruction;Gait training;Stair training;Functional mobility training;Therapeutic activities;Therapeutic exercise;Balance training;Patient/family education    PT Goals (Current goals can be found in the Care Plan section)  Acute Rehab PT Goals Patient Stated Goal: to improve strength/balance PT Goal Formulation: With patient Time For Goal Achievement: 04/20/19 Potential to Achieve Goals: Good    Frequency Min 2X/week   Barriers to discharge Decreased caregiver support      Co-evaluation               AM-PAC PT "6 Clicks" Mobility  Outcome Measure Help needed turning from your back to your side while in a flat bed without using bedrails?: A Little Help needed moving from lying on your back to sitting on the side of a flat bed without using bedrails?: A Lot Help needed moving to and from a bed to a chair (including a wheelchair)?: A Lot Help needed standing up from a chair using your arms (e.g., wheelchair or bedside chair)?: A Lot Help needed to walk in hospital  room?: A Lot Help needed climbing 3-5 steps with a railing? : A Lot 6 Click Score: 13    End of Session Equipment Utilized During Treatment: Gait belt Activity Tolerance: Other (comment)(Limited d/t HR elevation with activity) Patient left: in bed;with call bell/phone within reach;with nursing/sitter in room;Other (comment)(Sitter present) Nurse Communication: Mobility status;Precautions;Other (comment)(pt's HR during session) PT Visit Diagnosis: Other abnormalities of gait and mobility (R26.89);Muscle weakness (generalized) (M62.81);History of falling (Z91.81);Difficulty in walking, not elsewhere classified (R26.2)    Time: [id number] PT Time Calculation (min) (ACUTE ONLY): 21 min   Charges:   PT Evaluation $PT  Re-evaluation: 1 Re-eval PT Treatments $Therapeutic Activity: 8-22 mins        Leitha Bleak, PT 04/06/19, 3:36 PM

## 2019-04-06 NOTE — Progress Notes (Signed)
[facility] Progress Note  Patient: Peter Parsons    [id number]  DOA: 02/11/2019     Date of Service: the patient was seen and examined on 04/06/2019  Chief Complaint  Patient presents with  . ivc   Brief hospital course: Initially presented on 02/11/2019 from group home as he has suicidal ideation and was wandering off in the woods.  Prior to this patient had multiple ER visits with injuries after he ran out of his group home.  Patient was admitted at behavioral health for suicidal ideation in November 2020. Initial psychiatric evaluation recommended inpatient psychiatric admission at [facility]. On 02/18/2019 psychiatric recommended patient is cleared from psychiatric point of view and now patient requires a locked memory care unit. While awaiting for placement in ER, EDP noted increasing confusion, generalized weakness and dysphagia and patient was referred for further work-up for admission. MRI brain negative for any acute stroke. Speech evaluation pending. Currently further plan is complete further work-up and consult psychiatry as well as TOC team for assistance.  Assessment and Plan: 1.  Dysphagia, dysarthria, left facial droop CVA ruled out with MRI. Currently still has some dysphagia, pockets foods, but takes in fluids ok.    2.  Depression, anxiety, bipolar disorder and schizophrenia. Frequent suicidal ideation. Currently IVC. Patient definitely does not have any capacity to make medical decisions, and has a guardian. Patient was from a group home. --Continue Lexapro, Risperdal, trazodone --Continue prazosin --Hold Klonopin due to it causing extreme somnolence   3.  Type 2 diabetes mellitus uncontrolled with chronic kidney disease stage IIIa as well as peripheral neuropathy. Hold Metformin. Monitor renal function. Avoid nephrotoxic medications. --d/c fingersticks as pt has not needed much SSI.  4.  BPH Continue Flomax  5.  Dyslipidemia Continue  statin  6.  Essential hypertension Patient was on lisinopril for renal protective effect but we discontinued that given his potassium is mildly elevated as well as renal function is elevated. Monitor blood pressure for now.  7.  B12 deficiency.  Folate deficiency --replace with subcu B12 and oral replacement.  Severe Malnutrition related to social / environmental circumstances(inadequate oral intake) as evidenced by 21.6% weight loss over the past 2-3 months, moderate fat depletion, moderate-severe muscle depletion. --continue Remeron to attempt to enhance appetite  # No need for daily labs --Check labs twice a week   Diet: DYS 3 DVT Prophylaxis: Subcutaneous Heparin    Advance goals of care discussion: Full code  Family Communication: Not today.  Patient has a legal guardian.  Disposition:  Pt is from group home, admitted with dysarthria and dysphagia, originally brought in for suicidal ideation and currently IVC, still has confusion due to his schizophrenia, which precludes a safe discharge.  TOC team is working to identify locked memory care unit.  FL 2 send out on 02/26/2019. Discharge to memory care unit, when bed available.  Subjective:  Pt was noted to have elevated BP and HR with exertion.  PT re-eval found pt to have a decline in functional mobility.  Not eating very much.  No BM for a while.  No fever, N/V/D.   Physical Exam: Constitutional: NAD, alert, oriented to self HEENT: conjunctivae and lids normal, EOMI, drooling from right corner of the mouth CV: RRR no M,R,G. Distal pulses +2.  No cyanosis.   RESP: CTA B/L, normal respiratory effort  GI: +BS, NTND Extremities: No effusions, edema, or tenderness in BLE SKIN: warm, dry and intact Neuro: II - XII grossly intact.  Sensation intact Psych: flat mood and affect.     Vitals:   04/06/19 1112 04/06/19 1315 04/06/19 1325 04/06/19 1346  BP: (!) 142/94   112/77  Pulse: (!) 129 (!) 131 90 86  Resp:    18  Temp:  99.2 F (37.3 C)   98 F (36.7 C)  TempSrc: Oral   Oral  SpO2: 97%   97%  Weight:      Height:        Intake/Output Summary (Last 24 hours) at 04/06/2019 1654 Last data filed at 04/06/2019 1324 Gross per 24 hour  Intake 480 ml  Output 1350 ml  Net -870 ml   Filed Weights   02/11/19 1441 03/28/19 0306  Weight: 77.1 kg 70.8 kg    Data Reviewed: I have personally reviewed and interpreted daily labs, tele strips, imagings as discussed above. I reviewed all nursing notes, pharmacy notes, vitals, pertinent old records   CBC: Recent Labs  Lab 03/31/19 0446 04/06/19 0957  WBC 5.4 5.7  HGB 10.8* 11.2*  HCT 30.2* 32.0*  MCV 83.9 87.0  PLT 146* [id number]   Basic Metabolic Panel: Recent Labs  Lab 03/31/19 0446 04/06/19 0957  NA 142 142  K 3.8 3.9  CL 102 100  CO2 31 32  GLUCOSE 98 130*  BUN 18 44*  CREATININE 1.11 1.19  CALCIUM 9.2 9.7  MG 1.8 1.8    Studies: No results found.  Scheduled Meds: . atorvastatin  10 mg Oral Daily  . chlorhexidine  15 mL Mouth/Throat BID  . enoxaparin (LOVENOX) injection  40 mg Subcutaneous Q24H  . escitalopram  10 mg Oral Daily  . feeding supplement (NEPRO CARB STEADY)  237 mL Oral TID BM  . feeding supplement (PRO-STAT SUGAR FREE 64)  30 mL Oral BID WC  . gabapentin  300 mg Oral TID  . mirtazapine  15 mg Oral QHS  . multivitamin with minerals  1 tablet Oral Daily  . prazosin  2 mg Oral QHS  . risperiDONE  3 mg Oral BID  . senna-docusate  1 tablet Oral Daily  . tamsulosin  0.4 mg Oral Daily  . traZODone  150 mg Oral QHS   Continuous Infusions:  PRN Meds: clonazePAM, dextrose, senna-docusate  Enzo Bi, MD  [facility] 04/06/2019 4:54 PM  To reach On-call, see care teams to locate the attending and reach out to them via [email or web address]. If 7PM-7AM, please contact night-coverage If you still have difficulty reaching the attending provider, please page the [facility] (Director on Call) for [facility] on amion for assistance.

## 2019-04-06 NOTE — Progress Notes (Signed)
Occupational Therapy Treatment Patient Details Name: Peter Parsons MRN: [id number] DOB: [birth year] Today's Date: 04/06/2019    History of present illness Pt is 59 y/o M with PMH including: T2DM, HTN, and schizophrenia who had been on the [facility] 44 days prior to this encounter. Pt presents d/t generalized weakness, dysphagia, and slurred speech. MRI negative for acute CVA.   OT comments  Peter Parsons was seen for OT treatment on this date. Pt received seated semi-supine in bed with sitter present. Pt denies pain, and is oriented to self, place, and limited date, however he remains quite lethargic t/o the session. He requires significant increased time to respond to VCs. This Pryor Curia provides multimodal prompting t/o session to support pt attendance and participation. Pt requires mod A to come to sitting EOB, and endorses that he feels "weaker than before". Pt presents with increased generalized weakness this date, suspect from immobility and limited opportunity to ambulate in the hospital setting. Recommend pt have PT consult to address weakness and decreased mobility. MD notified. Pt limited by cardiopulmonary status this date with BP of 142/94 and HR 130 when assessed with pt sitting at EOB. Pt endorses dizziness upon sitting which does not improve with therapeutic rest break. Sitter in room and aware and RN notified via secure chat. OT assists pt with return to bed. OT engages pt in bed-level ADL described below. Pt states dizziness is improved upon returning to supine. BP checked and noted to be 100/85 with pt HR 100. Pt left semi-supine in bed with sitter in room at end of session. Pt continues to benefit from skilled OT services to maximize return to PLOF and minimize risk of future falls, injury, caregiver burden, and readmission. Will continue to follow POC. Discharge recommendation remains appropriate.    Follow Up Recommendations  Home health OT;Supervision - Intermittent    Equipment  Recommendations  3 in 1 bedside commode;Other (comment)    Recommendations for Other Services      Precautions / Restrictions Precautions Precautions: Fall Precaution Comments: Suicide precautions; IVC Restrictions Weight Bearing Restrictions: No       Mobility Bed Mobility Overal bed mobility: Needs Assistance Bed Mobility: Supine to Sit     Supine to sit: Mod assist     General bed mobility comments: Pt requires increased assistance to come to sitting at EOB, he presents as generally weak and endorses dizziness upon sitting.  Transfers                 General transfer comment: Deferred. Pt BP increases to 142/94 with HR up to 130 upon sitting EOB. Pt endorses dizziness, returs to semi-supine in bed for remainder of session.    Balance Overall balance assessment: Needs assistance Sitting-balance support: No upper extremity supported;Feet supported Sitting balance-Leahy Scale: Fair Sitting balance - Comments: Pt requires multimodal cueing to maintain upright posture at EOB 2/2 increased posterior lean this date Pt is able to self-correct with cueing. Postural control: Posterior lean                                 ADL either performed or assessed with clinical judgement   ADL Overall ADL's : Needs assistance/impaired                                       General ADL Comments: OT attempts  to engage pt in standing grooming tasks, however he has difficulty coming to sitting at EOB. He endorses dizziness upon sitting and HR noted to reach 130's with BP reading at EOB 142/94. Pt returns to supine in bed and performs bed-level oral care and face washing given set-up assist and min cueing for sequencing.     Vision       Perception     Praxis      Cognition Arousal/Alertness: Lethargic Behavior During Therapy: Flat affect Overall Cognitive Status: Difficult to assess                                 General  Comments: Pt speech is slow and he demos limited eye contact/engagement. He requires multimodal cueing t/o session to attend to tasks. He continues to be lethargic, but agreeable to participation in OT tx this date.        Exercises Other Exercises Other Exercises: OT engages pt in bed mobility & bed level grooming tasks. See above for additional detail. Other Exercises: Time to monitor vitals x 5 min.   Shoulder Instructions       General Comments Pt endorses dizziness upon coming to sitting at EOB. BP noted to be 142/94 with HR of 128-130. Pt returned to supine and BP checked after ~2 min and noted to be 100/85 with HR of 100.    Pertinent Vitals/ Pain       Pain Assessment: No/denies pain  Home Living                                          Prior Functioning/Environment              Frequency  Min 2X/week        Progress Toward Goals  OT Goals(current goals can now be found in the care plan section)  Progress towards OT goals: Progressing toward goals  Acute Rehab OT Goals Patient Stated Goal: to improve strength/balance OT Goal Formulation: With patient Time For Goal Achievement: 04/12/19 Potential to Achieve Goals: Good  Plan Discharge plan remains appropriate;Frequency remains appropriate    Co-evaluation                 AM-PAC OT "6 Clicks" Daily Activity     Outcome Measure   Help from another person eating meals?: A Little Help from another person taking care of personal grooming?: A Little Help from another person toileting, which includes using toliet, bedpan, or urinal?: A Little Help from another person bathing (including washing, rinsing, drying)?: A Lot Help from another person to put on and taking off regular upper body clothing?: A Little Help from another person to put on and taking off regular lower body clothing?: A Little 6 Click Score: 17    End of Session    OT Visit Diagnosis: Unsteadiness on feet  (R26.81);Muscle weakness (generalized) (M62.81)   Activity Tolerance Treatment limited secondary to medical complications (Comment)   Patient Left with call bell/phone within reach;in bed;with bed alarm set;with nursing/sitter in room   Nurse Communication Other (comment)(Pt BP during session.)        Time: 1035-1130 OT Time Calculation (min): 55 min  Charges: OT General Charges $OT Visit: 1 Visit OT Treatments $Self Care/Home Management : 53-67 mins  Shara Blazing, M.S., OTR/L Ascom: [phone] 04/06/19,  1:11 PM

## 2019-04-07 DIAGNOSIS — F200 Paranoid schizophrenia: Secondary | ICD-10-CM | POA: Diagnosis not present

## 2019-04-07 NOTE — TOC Progression Note (Addendum)
Transition of Care [facility]) - Progression Note    Patient Details  Name: Peter Parsons MRN: [id number] Date of Birth: 1960-07-19  Transition of Care [facility]) CM/SW Contact  Haelee Bolen, Gardiner Rhyme, LCSW Phone Number: 04/07/2019, 9:57 AM  Clinical Narrative: Spoke with Dr. Posey Pronto to inquire if pt needs the sitter in his room. He feels the sitter can be discharged and pt moved closer to the nurses desk and see how he does. Have contacted [place] to inform her of this. Still waiting to hear from multiple faciities pt's information sent out to and [facility] and [facility], looking at information. Continue to work on memory care bed.  2:50 PM MD has not renewed the IVC and sitter. Moved pt closer to the nursing desk in 144 to be more closly monitored.  Have informed [facility] and [facility] of this.    3;56 PM have faxed information to [facility] and [place]  Will contact to see if received information. Continue to look for a memory bed for pt.    Expected Discharge Plan and Services                                                 Social Determinants of Health (SDOH) Interventions    Readmission Risk Interventions No flowsheet data found.

## 2019-04-07 NOTE — Progress Notes (Signed)
[facility] Progress Note  Patient: Peter Parsons    [id number]  DOA: 02/11/2019     Date of Service: the patient was seen and examined on 04/07/2019  Chief Complaint  Patient presents with  . ivc   Brief hospital course: Initially presented on 02/11/2019 from group home as he has suicidal ideation and was wandering off in the woods.  Prior to this patient had multiple ER visits with injuries after he ran out of his group home.  Patient was admitted at behavioral health for suicidal ideation in November 2020. Initial psychiatric evaluation recommended inpatient psychiatric admission at [facility]. On 02/18/2019 psychiatric recommended patient is cleared from psychiatric point of view and now patient requires a locked memory care unit. While awaiting for placement in ER, EDP noted increasing confusion, generalized weakness and dysphagia and patient was referred for further work-up for admission. MRI brain negative for any acute stroke. Speech evaluation recommends dysphagia diet. Discussed with psychiatry IVC can be rescinded so it was performed on 04/07/2019.  Although the patient does not have medical decision-making capacity therefore cannot leave AMA.  Currently further plan is arranged for safe discharge for this patient.  Assessment and Plan: 1.  Dysphagia, dysarthria, left facial droop CVA ruled out with MRI. Currently still has some dysphagia, pockets foods, but takes in fluids ok.    2.  Depression, anxiety, bipolar disorder and schizophrenia. Frequent suicidal ideation. Initially IVC. Patient definitely does not have any capacity to make medical decisions, and has a guardian. Patient was from a group home. --Continue Lexapro, Risperdal, trazodone --Continue prazosin --Hold Klonopin due to it causing extreme somnolence   3.  Type 2 diabetes mellitus uncontrolled with chronic kidney disease stage IIIa as well as peripheral neuropathy. Hold Metformin. Monitor  renal function. Avoid nephrotoxic medications. --d/c fingersticks as pt has not needed much SSI.  4.  BPH Continue Flomax  5.  Dyslipidemia Continue statin  6.  Essential hypertension Patient was on lisinopril for renal protective effect but we discontinued that given his potassium is mildly elevated as well as renal function is elevated. Monitor blood pressure for now.  7.  B12 deficiency.  Folate deficiency --replaced with subcu B12 and oral replacement.  8. Severe Malnutrition related to social / environmental circumstances(inadequate oral intake) as evidenced by 21.6% weight loss over the past 2-3 months, moderate fat depletion, moderate-severe muscle depletion. --continue Remeron to attempt to enhance appetite  Diet: DYS 3 DVT Prophylaxis: Subcutaneous Heparin    Advance goals of care discussion: Full code  Family Communication: Not today.  Patient has a legal guardian.  Disposition:  Pt is from group home, admitted with dysarthria and dysphagia, originally brought in for suicidal ideation and currently IVC, still has confusion due to his schizophrenia, which precludes a safe discharge.  TOC team is working to identify locked memory care unit.  FL 2 send out on 02/26/2019. Discharge to memory care unit, when bed available.  Subjective:  Patient denies any acute complaint no nausea no vomiting.  Remains tired and fatigued.  No diarrhea.  Reports poor appetite.  No BM reported by the patient today.   Physical Exam: General: Appear in mild distress, no Rash; Oral Mucosa Clear, moist. no Abnormal Mass Or lumps Cardiovascular: S1 and S2 Present, no Murmur, Respiratory: normal respiratory effort, Bilateral Air entry present and Clear to Auscultation, no Crackles, no wheezes Abdomen: Bowel Sound present, Soft and no tenderness, no hernia Extremities: no Pedal edema, no calf tenderness Neurology: alert  and oriented to time, place, and person affect appropriate.      Vitals:     04/06/19 2030 04/07/19 0006 04/07/19 0415 04/07/19 1313  BP: 106/66 96/66 100/70 105/78  Pulse: 79 70 62 (!) 104  Resp: 18 18 18 18   Temp: 99 F (37.2 C) 98.9 F (37.2 C) 98.9 F (37.2 C) 97.7 F (36.5 C)  TempSrc: Oral Oral Oral Oral  SpO2: 98% 97% 98% 98%  Weight:      Height:        Intake/Output Summary (Last 24 hours) at 04/07/2019 1847 Last data filed at 04/07/2019 1500 Gross per 24 hour  Intake 710 ml  Output 725 ml  Net -15 ml   Filed Weights   02/11/19 1441 03/28/19 0306  Weight: 77.1 kg 70.8 kg    Data Reviewed: I have personally reviewed and interpreted daily labs, tele strips, imagings as discussed above. I reviewed all nursing notes, pharmacy notes, vitals, pertinent old records   CBC: Recent Labs  Lab 04/06/19 0957  WBC 5.7  HGB 11.2*  HCT 32.0*  MCV 87.0  PLT [id number]   Basic Metabolic Panel: Recent Labs  Lab 04/06/19 0957  NA 142  K 3.9  CL 100  CO2 32  GLUCOSE 130*  BUN 44*  CREATININE 1.19  CALCIUM 9.7  MG 1.8    Studies: No results found.  Scheduled Meds: . atorvastatin  10 mg Oral Daily  . chlorhexidine  15 mL Mouth/Throat BID  . enoxaparin (LOVENOX) injection  40 mg Subcutaneous Q24H  . escitalopram  10 mg Oral Daily  . feeding supplement (NEPRO CARB STEADY)  237 mL Oral TID BM  . feeding supplement (PRO-STAT SUGAR FREE 64)  30 mL Oral BID WC  . gabapentin  300 mg Oral TID  . mirtazapine  15 mg Oral QHS  . multivitamin with minerals  1 tablet Oral Daily  . prazosin  2 mg Oral QHS  . risperiDONE  3 mg Oral BID  . senna-docusate  1 tablet Oral Daily  . tamsulosin  0.4 mg Oral Daily  . traZODone  150 mg Oral QHS   Continuous Infusions:  PRN Meds: clonazePAM, dextrose, senna-docusate  Berle Mull, MD  [facility] 04/07/2019 6:47 PM  To reach On-call, see care teams to locate the attending and reach out to them via [email or web address]. If 7PM-7AM, please contact night-coverage If you still have difficulty reaching the  attending provider, please page the [facility] (Director on Call) for [facility] on amion for assistance.

## 2019-04-07 NOTE — Progress Notes (Signed)
Bedside shift report received from night nurse. Assessment completed. 1:1 sitter at bedside. Pt. Complaining of confusion but answers all questions correctly. Unable to identify any needs at this time. Will continue to monitor.

## 2019-04-07 NOTE — Plan of Care (Signed)
  Problem: Education: Goal: Knowledge of General Education information will improve Description: Including pain rating scale, medication(s)/side effects and non-pharmacologic comfort measures Outcome: Progressing   Problem: Nutrition: Goal: Adequate nutrition will be maintained Outcome: Progressing   Problem: Safety: Goal: Ability to remain free from injury will improve Outcome: Progressing   Problem: Ischemic Stroke/TIA Tissue Perfusion: Goal: Complications of ischemic stroke/TIA will be minimized Outcome: Progressing

## 2019-04-08 DIAGNOSIS — F200 Paranoid schizophrenia: Secondary | ICD-10-CM | POA: Diagnosis not present

## 2019-04-08 NOTE — TOC Progression Note (Signed)
Transition of Care [facility]) - Progression Note    Patient Details  Name: Peter Parsons MRN: [id number] Date of Birth: 09/26/1960  Transition of Care [facility]) CM/SW Contact  Merranda Bolls, Gardiner Rhyme, LCSW Phone Number: 04/08/2019, 12:12 PM  Clinical Narrative:  Spoke with [facility] they can offer pt a bed. [facility] has also declined pt. Will continue bed search and  Am already looking at memory care facilities throughout the state.         Expected Discharge Plan and Services                                                 Social Determinants of Health (SDOH) Interventions    Readmission Risk Interventions No flowsheet data found.

## 2019-04-08 NOTE — Progress Notes (Signed)
[facility] Progress Note  Patient: Peter Parsons    [id number]  DOA: 02/11/2019     Date of Service: the patient was seen and examined on 04/08/2019  Chief Complaint  Patient presents with  . ivc   Brief hospital course: Initially presented on 02/11/2019 from group home as he has suicidal ideation and was wandering off in the woods.  Prior to this patient had multiple ER visits with injuries after he ran out of his group home.  Patient was admitted at behavioral health for suicidal ideation in November 2020. Initial psychiatric evaluation recommended inpatient psychiatric admission at [facility]. On 02/18/2019 psychiatric recommended patient is cleared from psychiatric point of view and now patient requires a locked memory care unit. While awaiting for placement in ER, EDP noted increasing confusion, generalized weakness and dysphagia and patient was referred for further work-up for admission. MRI brain negative for any acute stroke. Speech evaluation recommends dysphagia diet. Discussed with psychiatry IVC can be rescinded so it was performed on 04/07/2019.  Although the patient does not have medical decision-making capacity therefore cannot leave AMA.  Currently further plan is arranged for safe discharge for this patient.  Assessment and Plan: 1.  Dysphagia, dysarthria, left facial droop CVA ruled out with MRI. Currently still has some dysphagia, pockets foods, but takes in fluids ok.    2.  Depression, anxiety, bipolar disorder and schizophrenia. Frequent suicidal ideation. Initially IVC. Patient definitely does not have any capacity to make medical decisions, and has a guardian. Patient was from a group home. --Continue Lexapro, Risperdal, trazodone --Continue prazosin --Hold Klonopin due to it causing extreme somnolence   3.  Type 2 diabetes mellitus uncontrolled with chronic kidney disease stage IIIa as well as peripheral neuropathy. Hold Metformin. Monitor  renal function. Avoid nephrotoxic medications. --d/c fingersticks as pt has not needed much SSI.  4.  BPH Continue Flomax  5.  Dyslipidemia Continue statin  6.  Essential hypertension Patient was on lisinopril for renal protective effect but we discontinued that given his potassium is mildly elevated as well as renal function is elevated. Monitor blood pressure for now.  7.  B12 deficiency.  Folate deficiency --replaced with subcu B12 and oral replacement.  8. Severe Malnutrition related to social / environmental circumstances(inadequate oral intake) as evidenced by 21.6% weight loss over the past 2-3 months, moderate fat depletion, moderate-severe muscle depletion. --continue Remeron to attempt to enhance appetite -Calorie count shows that the patient is able to tolerate 100% of his require calorie due to increased intake of the nutritional supplementation.  Hoping for improvement down the road with the medicines.  Diet: DYS 3 DVT Prophylaxis: Subcutaneous Heparin    Advance goals of care discussion: Full code  Family Communication: Not today.  Patient has a legal guardian.  Disposition:  Pt is from group home, admitted with dysarthria and dysphagia, originally brought in for suicidal ideation and currently IVC, still has confusion due to his schizophrenia, which precludes a safe discharge.  TOC team is working to identify locked memory care unit. Discharge to memory care unit, when bed available.  Subjective:  No acute complaint no nausea no vomiting no fever no chills.   Physical Exam:    General: Appear in mild distress, no Rash; Oral Mucosa Clear, moist. no Abnormal Neck Mass Or lumps, Conjunctiva normal  Cardiovascular: S1 and S2 Present, no Murmur, Respiratory: normal respiratory effort, Bilateral Air entry present and Clear to Auscultation, no Crackles, no wheezes Abdomen: Bowel Sound present,  Soft and no tenderness, no hernia Extremities: no Pedal edema, no calf  tenderness Neurology: alert and oriented to time and place affect flat in affect.      Vitals:   04/07/19 2346 04/08/19 0752 04/08/19 1120 04/08/19 1539  BP: 105/73 127/76 109/62 112/76  Pulse: (!) 103 82 71 65  Resp: 16 17 16 17   Temp: 98 F (36.7 C) (!) 97.4 F (36.3 C) 98.3 F (36.8 C) 97.9 F (36.6 C)  TempSrc: Oral Axillary Axillary Axillary  SpO2: 96% 97% 97% 96%  Weight:      Height:        Intake/Output Summary (Last 24 hours) at 04/08/2019 1819 Last data filed at 04/08/2019 1300 Gross per 24 hour  Intake -  Output 1160 ml  Net -1160 ml   Filed Weights   02/11/19 1441 03/28/19 0306  Weight: 77.1 kg 70.8 kg    Data Reviewed: I have personally reviewed and interpreted daily labs, tele strips, imagings as discussed above. I reviewed all nursing notes, pharmacy notes, vitals, pertinent old records   CBC: Recent Labs  Lab 04/06/19 0957  WBC 5.7  HGB 11.2*  HCT 32.0*  MCV 87.0  PLT [id number]   Basic Metabolic Panel: Recent Labs  Lab 04/06/19 0957  NA 142  K 3.9  CL 100  CO2 32  GLUCOSE 130*  BUN 44*  CREATININE 1.19  CALCIUM 9.7  MG 1.8    Studies: No results found.  Scheduled Meds: . atorvastatin  10 mg Oral Daily  . chlorhexidine  15 mL Mouth/Throat BID  . enoxaparin (LOVENOX) injection  40 mg Subcutaneous Q24H  . escitalopram  10 mg Oral Daily  . feeding supplement (NEPRO CARB STEADY)  237 mL Oral TID BM  . feeding supplement (PRO-STAT SUGAR FREE 64)  30 mL Oral BID WC  . gabapentin  300 mg Oral TID  . mirtazapine  15 mg Oral QHS  . multivitamin with minerals  1 tablet Oral Daily  . prazosin  2 mg Oral QHS  . risperiDONE  3 mg Oral BID  . senna-docusate  1 tablet Oral Daily  . tamsulosin  0.4 mg Oral Daily  . traZODone  150 mg Oral QHS   Continuous Infusions:  PRN Meds: clonazePAM, dextrose, senna-docusate  Berle Mull, MD  [facility] 04/08/2019 6:19 PM  To reach On-call, see care teams to locate the attending and reach  out to them via [email or web address]. If 7PM-7AM, please contact night-coverage If you still have difficulty reaching the attending provider, please page the [facility] (Director on Call) for [facility] on amion for assistance.

## 2019-04-08 NOTE — Progress Notes (Signed)
PT Cancellation Note  Patient Details Name: Peter Parsons MRN: [id number] DOB: [birth year]   Cancelled Treatment:    Reason Eval/Treat Not Completed: Other (comment).  Pt resting in bed upon PT arrival and talking with therapist.  When therapist attempted to get pt out of bed, pt with eyes closed and would not talk or initiate any movement with vc's and tactile cues.  Therapist then asked pt if he just did not want to get up/work with therapist and pt vocalized that he did not want to participate in therapy.  Unable to encourage pt to participate in therapy.  Nurse notified.  Will re-attempt PT treatment session at a later date/time.  Leitha Bleak, PT 04/08/19, 2:26 PM

## 2019-04-08 NOTE — Progress Notes (Signed)
Calorie Count Note RD working remotely.  48 hour calorie count ordered.  Diet: Dysphagia 1 with thin liquids Supplements: Nepro TID (each supplement provides 425 kcal and 19 grams protein); Pro-Stat BID (each supplement provides 100 kcal and 15 grams of protein); Magic Cup TID (each supplement provides 290 kcal and 9 grams of protein)  No meal tickets were collected for day 1 of calorie count (2/23) so intake was not able to be calculated.  Day 2 Intake: Breakfast 2/24: 283 kcal, 6 grams of protein Lunch 2/24: 260 kcal, 18 grams of protein Dinner 2/24: 74 kcal, 0 grams of protein Supplements 2/24: 1474 kcal, 87 grams of protein  Total intake Day 2: 2091 kcal (100% of minimum estimated needs)  111 protein (100% of minimum estimated needs)  Estimated Nutritional Needs:  Kcal:  1800-2000 Protein:  90-100 grams Fluid:  1.8-2 L/day  Nutrition Dx: Severe Malnutrition related to social / environmental circumstances(inadequate oral intake) as evidenced by percent weight loss, moderate fat depletion, moderate muscle depletion, severe muscle depletion.  Goal: Patient will meet greater than or equal to 90% of their needs  Intervention:  -Nepro Shake po TID, each supplement provides 425 kcal and 19 grams protein -Pro-Stat 30 mL po BID with meals, each supplement provides 100 kcal and 15 grams of protein -Magic cup TID with meals, each supplement provides 290 kcal and 9 grams of protein -Daily MVI -Recommend continuing appetite stimulant  Jacklynn Barnacle, MS, RD, LDN Pager number available on Amion

## 2019-04-08 NOTE — NC FL2 (Signed)
[place] LEVEL OF CARE SCREENING TOOL     IDENTIFICATION  Patient Name: Peter Parsons Birthdate: 20-Jul-1960 Sex: male Admission Date (Current Location): 02/11/2019  [place] and [place] Number:  [name] [id number] [place] and Address:  [facility], [address] [place], [place] [address]      Provider Number: [id number]  Attending Physician Name and Address:  Lavina Hamman, MD  Relative Name and Phone Number:  Norberto Sorenson guardian [phone]    Current Level of Care: Hospital Recommended Level of Care: [place], Memory Care Prior Approval Number:    Date Approved/Denied:   PASRR Number:    Discharge Plan: SNF(memory care skilled nursing)    Current Diagnoses: Patient Active Problem List   Diagnosis Date Noted  . Acute metabolic encephalopathy [id number]  . Protein-calorie malnutrition, severe 03/29/2019  . Weakness 03/27/2019  . Dementia ([place]) 03/19/2019  . Altered mental status   . Paranoid schizophrenia ([place])     Orientation RESPIRATION BLADDER Height & Weight     Self, Time, Situation  Normal Continent Weight: 70.8 kg Height:  5\' 9"  (175.3 cm)  BEHAVIORAL SYMPTOMS/MOOD NEUROLOGICAL BOWEL NUTRITION STATUS  Wanderer   Continent Diet(Mechanical soft diet thin liquids)  AMBULATORY STATUS COMMUNICATION OF NEEDS Skin   Limited Assist Verbally Normal                       Personal Care Assistance Level of Assistance  Bathing, Dressing Bathing Assistance: Limited assistance   Dressing Assistance: Limited assistance     Functional Limitations Info  Speech     Speech Info: Impaired    SPECIAL CARE FACTORS FREQUENCY  PT (By licensed PT)     PT Frequency: 5 times per week              Contractures Contractures Info: Not present    Additional Factors Info  Code Status, Allergies Code Status Info: Full Code Allergies Info: Acetaminophen           Current Medications  (04/08/2019):  This is the current hospital active medication list Current Facility-Administered Medications  Medication Dose Route Frequency Provider Last Rate Last Admin  . atorvastatin (LIPITOR) tablet 10 mg  10 mg Oral Daily Carrie Mew, MD   10 mg at 04/08/19 [id number]  . chlorhexidine (PERIDEX) 0.12 % solution 15 mL  15 mL Mouth/Throat BID Lavina Hamman, MD   15 mL at 04/08/19 0936  . clonazePAM (KLONOPIN) tablet 0.25 mg  0.25 mg Oral TID PRN Dixie Dials, MD   0.25 mg at 04/02/19 1621  . dextrose 50 % solution 50 mL  1 ampule Intravenous PRN Lavina Hamman, MD      . enoxaparin (LOVENOX) injection 40 mg  40 mg Subcutaneous Q24H Lavina Hamman, MD   40 mg at 04/08/19 0529  . escitalopram (LEXAPRO) tablet 10 mg  10 mg Oral Daily Cristofano, Dorene Ar, MD   10 mg at 04/08/19 [id number]  . feeding supplement (NEPRO CARB STEADY) liquid 237 mL  237 mL Oral TID BM Enzo Bi, MD   237 mL at 04/08/19 1358  . feeding supplement (PRO-STAT SUGAR FREE 64) liquid 30 mL  30 mL Oral BID WC Enzo Bi, MD   30 mL at 04/08/19 0937  . gabapentin (NEURONTIN) capsule 300 mg  300 mg Oral TID Carrie Mew, MD   300 mg at 04/08/19 [id number]  . mirtazapine (REMERON) tablet 15 mg  15 mg Oral QHS Billie Ruddy,  Otila Kluver, MD   15 mg at 04/07/19 2224  . multivitamin with minerals tablet 1 tablet  1 tablet Oral Daily Cristofano, Dorene Ar, MD   1 tablet at 04/08/19 0936  . prazosin (MINIPRESS) capsule 2 mg  2 mg Oral QHS Johnn Hai, MD   2 mg at 04/07/19 2224  . risperiDONE (RISPERDAL) tablet 3 mg  3 mg Oral BID Carrie Mew, MD   3 mg at 04/08/19 [id number]  . senna-docusate (Senokot-S) tablet 1 tablet  1 tablet Oral QHS PRN Mansy, Arvella Merles, MD      . senna-docusate (Senokot-S) tablet 1 tablet  1 tablet Oral Daily Enzo Bi, MD   1 tablet at 04/08/19 [id number]  . tamsulosin (FLOMAX) capsule 0.4 mg  0.4 mg Oral Daily Carrie Mew, MD   0.4 mg at 04/08/19 [id number]  . traZODone (DESYREL) tablet 150 mg  150 mg Oral QHS Carrie Mew, MD    150 mg at 04/07/19 2224     Discharge Medications: Please see discharge summary for a list of discharge medications.  Relevant Imaging Results:  Relevant Lab Results:   Additional Information SSN: [id number]  Shelbie Hutching, RN

## 2019-04-09 DIAGNOSIS — F200 Paranoid schizophrenia: Secondary | ICD-10-CM | POA: Diagnosis not present

## 2019-04-09 NOTE — Consult Note (Signed)
  Peter Parsons is a 59 year old male with a history of schizophrenia and dementia.  Patient was initially in the behavioral health unit holding area until he developed a mild case of dehydration and was admitted for medical purposes.  Patient is well-known to Probation officer for his extended stay here.  Patient currently doing well, making improvements with regards to mood and symptoms.  At this time patient's IVC was rescinded due to his denial of suicidal ideation.  Patient adamantly denies any further suicidal thoughts.  However patient still remains unable to care for himself in any unsupervised manner and therefore will continue to require placement at this time.  No changes to psychiatric medications as patient has remained stable.   Plan: Continue to search for placement for patient Continue current psychiatric regimen  Clonazepam 0.25 mg 3 times daily as needed Lexapro 10 mg every morning Mirtazapine 15 mg nightly Risperidone 3 mg twice daily Prazosin 2 mg nightly Trazodone 150 mg nightly

## 2019-04-09 NOTE — TOC Progression Note (Signed)
Transition of Care [facility]) - Progression Note    Patient Details  Name: Peter Parsons MRN: [id number] Date of Birth: 1960/04/13  Transition [facility]) CM/SW Contact  Shelbie Ammons, RN Phone Number: 04/09/2019, 3:27 PM  Clinical Narrative:   RNCM spoke with Marden Noble at [facility] and faxed updated psychiatry evaluation to [phone]. [name] reported that he would make sure they re-visited the referral.          Expected Discharge Plan and Services                                                 Social Determinants of Health (SDOH) Interventions    Readmission Risk Interventions No flowsheet data found.

## 2019-04-09 NOTE — Progress Notes (Signed)
Occupational Therapy Treatment Patient Details Name: Elver Giacona MRN: [id number] DOB: 1960/10/10 Today's Date: 04/09/2019    History of present illness Pt is a 59 y.o. male initially presenting to hospital 02/11/20 with IVC from group home d/t wandering off into woods multiple times (found near water); pt reporting suicidal thoughts of drowning himself.  Pt tripped on his own feet and hit chin on chair on morning of 2/4 (pt got back up and walked to restroom independently).  Unable to return to group home d/t safety concerns.  Per chart review pt scored 19/30 on [facility] cognitive assessment with psychiatry indicative of moderate cognitive impairment (consistent with pt's diagnosis of dementia).  Pt was in [place] in ED for about 44 days prior to being admitted to hospital 2/13 for generalized weakness, dysphagia, and slurred speech.  MRI negative for acute CVA.  PMH includes DM, htn, schizophrenia.   OT comments  Patient seen this AM for OT session.  Upon entering room, patient was attempting to get himself out of recliner without assistance.  Therapist assisted patient back to recliner in correct positioning.  Pt had negative comments towards therapist/therapy.  Session targeting ADL retraining this date.  Encouraged patient to perform simple grooming tasks (I.e. washing face, brushing hair).  Pt agreeable but required MAX A and hand over hand assistance for initiation and follow through of task.  Completed sit to stand from recliner with MIN A and performed functional mobility to restroom using RW with MIN/MOD A due to posterior lean.  Patient demonstrates poor safety awareness and insight into his abilities and would often attempt push walker out of his way.  Demonstrated poor concentration and attention to task; noted patient would stop and try to pick up items out of base of support on the way to restroom.  Completed toileting (hygiene) with CGA and lateral leans.  MIN A to maintain standing balance at  sink to perform hand hygiene.  Encouraged patient to return to recliner for lunch; patient stated "I won't" and began to buckle knees.  Assisted patient back to EOB.  MAX A required to assist patient to supine.  Patient stated he would try to sit up later. Provided education on importance of sitting up in recliner for overall health.  Patient closed eyes.  Pt would benefit from continued skilled OT to address afore mentioned performance components.  Based on today's session, therapist recommending 24/7 supervision for safety.       Follow Up Recommendations  Home health OT;Supervision/Assistance - 24 hour(REcommending 24/7 SPV based on today's performance)    Equipment Recommendations  3 in 1 bedside commode;Other (comment)    Recommendations for Other Services      Precautions / Restrictions Precautions Precautions: Fall Precaution Comments: Suicide precautions; aspiration precautions Restrictions Weight Bearing Restrictions: No       Mobility Bed Mobility Overal bed mobility: Needs Assistance Bed Mobility: Sit to Supine      Sit to supine: Max assist;HOB elevated   General bed mobility comments: MAX A needed to scoot self up in bed for positioning.  Poor proprioceptive awareness.  Transfers Overall transfer level: Needs assistance Equipment used: Rolling walker (2 wheeled) Transfers: Sit to/from [facility] Sit to Stand: Min assist Stand pivot transfers: Mod assist       General transfer comment: Requires education on body mechanics and self pacing.  Demonstrates poor insight into use of RW and will often push it away.  Demonstrates posterior lean in standing and sitting.  Patient had  1 LOB that therapist was able to correct during toilet transfer.    Balance Overall balance assessment: Needs assistance Sitting-balance support: No upper extremity supported;Feet supported(Patient will continue to lean posteriorly) Sitting balance-Leahy Scale:  Good Sitting balance - Comments: steady sitting reaching within BOS Postural control: Posterior lean Standing balance support: Bilateral upper extremity supported Standing balance-Leahy Scale: Poor Standing balance comment: posterior lean noted in standing (UE support on RW) requiring min to mod assist for balance                           ADL either performed or assessed with clinical judgement   ADL Overall ADL's : Needs assistance/impaired Eating/Feeding: Modified independent Eating/Feeding Details (indicate cue type and reason): Extra time Grooming: Wash/dry hands;Wash/dry face;Sitting;Maximal assistance Grooming Details (indicate cue type and reason): Attempted hand over hand grooming.  Patient stated he did not want to participate.                 Toilet Transfer: Minimal assistance;Grab IT sales professional Details (indicate cue type and reason): Requires cues for body mechanics, pacing and safety.  Poor proprioceptive awareness. Toileting- Clothing Manipulation and Hygiene: Min guard;Sitting/lateral lean Toileting - Clothing Manipulation Details (indicate cue type and reason): VCs to safe leaning while seated and holding hand rail as needed.     Functional mobility during ADLs: Minimal assistance;Rolling walker General ADL Comments: Patient will engage in activities he deems desireable, otherwise will need promping or hand over hand assistance to complete.  Demonstrates poor safety awareness/self awareness in regards to his personal abilities.     Vision Patient Visual Report: No change from baseline     Perception     Praxis      Cognition Arousal/Alertness: Awake/alert Behavior During Therapy: Flat affect Overall Cognitive Status: Difficult to assess                                 General Comments: Requires extra time to respond verbally.  OFten need to repeat self and provide extra time for patient processing.         Exercises Other Exercises Other Exercises: Educated patient on body mechanics during functional transfers Other Exercises: Educated patient on self pacing and safety during functional transfers/mobility Other Exercises: Educated patient on importance of sitting up in recliner throughout day for overall health and wellness   Shoulder Instructions       General Comments Patient demonstrates decreased insight and self awareness in to abilities.  Patient with negative comments towards therapy.    Pertinent Vitals/ Pain       Pain Assessment: No/denies pain Faces Pain Scale: No hurt Pain Intervention(s): Limited activity within patient's tolerance;Monitored during session;Repositioned  Home Living                                          Prior Functioning/Environment              Frequency  Min 2X/week        Progress Toward Goals  OT Goals(current goals can now be found in the care plan section)  Progress towards OT goals: Progressing toward goals  Acute Rehab OT Goals Patient Stated Goal: to improve strength/balance  Plan Frequency remains appropriate;Discharge plan needs to be updated    Co-evaluation  AM-PAC OT "6 Clicks" Daily Activity     Outcome Measure   Help from another person eating meals?: A Little Help from another person taking care of personal grooming?: A Little Help from another person toileting, which includes using toliet, bedpan, or urinal?: A Little Help from another person bathing (including washing, rinsing, drying)?: A Lot Help from another person to put on and taking off regular upper body clothing?: A Little Help from another person to put on and taking off regular lower body clothing?: A Lot 6 Click Score: 16    End of Session Equipment Utilized During Treatment: Gait belt;Rolling walker  OT Visit Diagnosis: Unsteadiness on feet (R26.81);Muscle weakness (generalized) (M62.81)   Activity  Tolerance Patient tolerated treatment well   Patient Left in bed;with call bell/phone within reach;with bed alarm set   Nurse Communication          Time: 1109-1140 OT Time Calculation (min): 31 min  Charges: OT General Charges $OT Visit: 1 Visit OT Treatments $Self Care/Home Management : 23-37 mins  Baldomero Lamy, MS, OTR/L 04/09/19, 12:45 PM

## 2019-04-09 NOTE — Progress Notes (Signed)
Physical Therapy Treatment Patient Details Name: Peter Parsons MRN: [id number] DOB: [birth year] Today's Date: 04/09/2019    History of Present Illness Pt is a 59 y.o. male initially presenting to hospital 02/11/20 with IVC from group home d/t wandering off into woods multiple times (found near water); pt reporting suicidal thoughts of drowning himself.  Pt tripped on his own feet and hit chin on chair on morning of 2/4 (pt got back up and walked to restroom independently).  Unable to return to group home d/t safety concerns.  Per chart review pt scored 19/30 on [facility] cognitive assessment with psychiatry indicative of moderate cognitive impairment (consistent with pt's diagnosis of dementia).  Pt was in [place] in ED for about 44 days prior to being admitted to hospital 2/13 for generalized weakness, dysphagia, and slurred speech.  MRI negative for acute CVA.  PMH includes DM, htn, schizophrenia.    PT Comments    Pt resting in bed upon PT arrival.  Pt requiring assist to initiate movement and then pt would assist with functional mobility.  Posterior lean noted in standing and walking (using RW) requiring assist to shift weight forward.  Drooling noted out of R side of mouth (nurse present during session and aware).  Improved alertness noted with activity.  HR 82 bpm at rest beginning of session; HR increased to 124-129 bpm with activity; HR decreased to 99-100 bpm resting in chair end of session.  Will continue to focus on strengthening and progressive functional mobility during hospitalization.   Follow Up Recommendations  SNF     Equipment Recommendations  Rolling walker with 5" wheels;3in1 (PT)    Recommendations for Other Services       Precautions / Restrictions Precautions Precautions: Fall Precaution Comments: Suicide precautions; aspiration precautions Restrictions Weight Bearing Restrictions: No    Mobility  Bed Mobility Overal bed mobility: Needs Assistance Bed Mobility:  Supine to Sit     Supine to sit: Mod assist;Max assist;HOB elevated     General bed mobility comments: assist for trunk and B LE's semi-supine to sitting edge of bed; assist required to initiate movement  Transfers Overall transfer level: Needs assistance Equipment used: Rolling walker (2 wheeled) Transfers: Sit to/from [facility] Sit to Stand: Min assist;Mod assist Stand pivot transfers: Min assist;Mod assist       General transfer comment: min to mod assist x1 standing up to walker from bed and recliner; stand step turn bed to recliner with RW; assist and cueing required for balance d/t posterior lean; assist for walker management  Ambulation/Gait Ambulation/Gait assistance: Min assist Gait Distance (Feet): 70 Feet Assistive device: Rolling walker (2 wheeled) Gait Pattern/deviations: Narrow base of support Gait velocity: decreased   General Gait Details: decreased B LE step length/foot clearance/heelstrike; shuffling type gait pattern; mild posterior lean with ambulation   Stairs             Wheelchair Mobility    Modified Rankin (Stroke Patients Only)       Balance Overall balance assessment: Needs assistance Sitting-balance support: No upper extremity supported;Feet supported Sitting balance-Leahy Scale: Good Sitting balance - Comments: steady sitting reaching within BOS   Standing balance support: Bilateral upper extremity supported Standing balance-Leahy Scale: Poor Standing balance comment: posterior lean noted in standing (UE support on RW) requiring min to mod assist for balance                            Cognition Arousal/Alertness:  Awake/alert(appearing lethargic intermittently) Behavior During Therapy: Flat affect Overall Cognitive Status: Difficult to assess                                 General Comments: increased time to respond; slower speech; pt occasionally talking to therapist (short  sentences)      Exercises      General Comments   Nursing cleared pt for participation in physical therapy.  Pt agreeable to PT session with encouragement.      Pertinent Vitals/Pain Pain Assessment: Faces Faces Pain Scale: No hurt Pain Intervention(s): Limited activity within patient's tolerance;Monitored during session;Repositioned  O2 on room air stable and WFL throughout treatment session.    Home Living                      Prior Function            PT Goals (current goals can now be found in the care plan section) Acute Rehab PT Goals Patient Stated Goal: to improve strength/balance PT Goal Formulation: With patient Time For Goal Achievement: 04/20/19 Potential to Achieve Goals: Good Progress towards PT goals: Progressing toward goals    Frequency    Min 2X/week      PT Plan Current plan remains appropriate    Co-evaluation              AM-PAC PT "6 Clicks" Mobility   Outcome Measure  Help needed turning from your back to your side while in a flat bed without using bedrails?: A Little Help needed moving from lying on your back to sitting on the side of a flat bed without using bedrails?: A Lot Help needed moving to and from a bed to a chair (including a wheelchair)?: A Lot Help needed standing up from a chair using your arms (e.g., wheelchair or bedside chair)?: A Lot Help needed to walk in hospital room?: A Little Help needed climbing 3-5 steps with a railing? : A Lot 6 Click Score: 14    End of Session Equipment Utilized During Treatment: Gait belt Activity Tolerance: Other (comment)(HR elevation with activity) Patient left: in chair;with call bell/phone within reach;with chair alarm set;Other (comment)(B heels floating via pillow) Nurse Communication: Mobility status;Precautions(pt's HR with activity) PT Visit Diagnosis: Other abnormalities of gait and mobility (R26.89);Muscle weakness (generalized) (M62.81);History of falling  (Z91.81);Difficulty in walking, not elsewhere classified (R26.2)     Time: [id number] PT Time Calculation (min) (ACUTE ONLY): 46 min  Charges:  $Gait Training: 8-22 mins $Therapeutic Activity: 23-37 mins                     Leitha Bleak, PT 04/09/19, 11:41 AM

## 2019-04-09 NOTE — Progress Notes (Signed)
PROGRESS NOTE    Peter Parsons  [id number] DOB: 09-Apr-1960 DOA: 02/11/2019 PCP: System, Provider Not In   Brief Narrative: Initially presented on 02/11/2019 from group home as he has suicidal ideation and was wandering off in the woods.  Prior to this patient had multiple ER visits with injuries after he ran out of his group home.  Patient was admitted at behavioral health for suicidal ideation in November 2020. Initial psychiatric evaluation recommended inpatient psychiatric admission at [facility]. On 02/18/2019 psychiatric recommended patient is cleared from psychiatric point of view and now patient requires a locked memory care unit. While awaiting for placement in ER, EDP noted increasing confusion, generalized weakness and dysphagia and patient was referred for further work-up for admission. MRI brain negative for any acute stroke. Speech evaluation recommends dysphagia diet. Discussed with psychiatry IVC can be rescinded so it was performed on 04/07/2019.  Although the patient does not have medical decision-making capacity therefore cannot leave AMA.  2/26: Patient seen and examined.  No changes noted over interval.  Pending arrangement of memory care unit.  Patient does not have medical decision-making capacity and cannot leave AGAINST MEDICAL ADVICE    Assessment & Plan:   Principal Problem:   Paranoid schizophrenia ([place]) Active Problems:   Altered mental status   Dementia ([place])   Weakness   Acute metabolic encephalopathy   Protein-calorie malnutrition, severe  1.  Dysphagia, dysarthria, left facial droop CVA ruled out with MRI. Currently still has some dysphagia, pockets foods, but takes in fluids ok.    2.  Depression, anxiety, bipolar disorder and schizophrenia. Frequent suicidal ideation. Initially IVC. Patient definitely does not have any capacity to make medical decisions, and has a guardian. Patient was from a group home. --Continue Lexapro,  Risperdal, trazodone --Continue prazosin --Hold Klonopin due to it causing extreme somnolence  Case management looking for memory care unit.  Patient cannot leave AMA.  3.  Type 2 diabetes mellitus uncontrolled with chronic kidney disease stage IIIa as well as peripheral neuropathy. Hold Metformin. Monitor renal function. Avoid nephrotoxic medications. --d/c fingersticks as pt has not needed much SSI.  4.  BPH Continue Flomax  5.  Dyslipidemia Continue statin  6.  Essential hypertension Patient was on lisinopril for renal protective effect but we discontinued that given his potassium is mildly elevated as well as renal function is elevated. Monitor blood pressure for now.  7.  B12 deficiency.  Folate deficiency --replaced with subcu B12 and oral replacement.  8. Severe Malnutritionrelated to social / environmental circumstances(inadequate oral intake)as evidenced by 21.6%weight loss over the past 2-3 months, moderate fat depletion, moderate-severe muscle depletion. --continue Remeron to attempt to enhance appetite -Calorie count shows that the patient is able to tolerate 100% of his require calorie due to increased intake of the nutritional supplementation.  Hoping for improvement down the road with the medicines.  DVT prophylaxis: Lovenox Code Status: Full Family Communication: None today, patient with legal guardian Disposition Plan: Unknown at this time.  Case management looking for memory care unit.  Consultants:   Psychiatry  Procedures:  None  Antimicrobials:  None   Subjective: Patient seen and examined No acute interval status changes No new complaints  Objective: Vitals:   04/08/19 1120 04/08/19 1539 04/08/19 2258 04/09/19 0757  BP: 109/62 112/76 104/72 110/74  Pulse: 71 65 73 63  Resp: 16 17 16 17   Temp: 98.3 F (36.8 C) 97.9 F (36.6 C) 98.6 F (37 C) 97.7 F (36.5 C)  TempSrc: Axillary Axillary Oral Axillary  SpO2: 97% 96% 98% 99%    Weight:      Height:        Intake/Output Summary (Last 24 hours) at 04/09/2019 1003 Last data filed at 04/09/2019 0600 Gross per 24 hour  Intake --  Output 1110 ml  Net -1110 ml   Filed Weights   02/11/19 1441 03/28/19 0306  Weight: 77.1 kg 70.8 kg    Examination:  General exam: Appears calm and comfortable  Respiratory system: Clear to auscultation. Respiratory effort normal. Cardiovascular system: S1 & S2 heard, RRR. No JVD, murmurs, rubs, gallops or clicks. No pedal edema. Gastrointestinal system: Abdomen is nondistended, soft and nontender. No organomegaly or masses felt. Normal bowel sounds heard. Central nervous system: Oriented x2.  No focal deficits Extremities: Symmetric 5 x 5 power. Skin: No rashes, lesions or ulcers Psychiatry: Flattened affect    Data Reviewed: I have personally reviewed following labs and imaging studies  CBC: Recent Labs  Lab 04/06/19 0957  WBC 5.7  HGB 11.2*  HCT 32.0*  MCV 87.0  PLT [id number]   Basic Metabolic Panel: Recent Labs  Lab 04/06/19 0957  NA 142  K 3.9  CL 100  CO2 32  GLUCOSE 130*  BUN 44*  CREATININE 1.19  CALCIUM 9.7  MG 1.8   GFR: Estimated Creatinine Clearance: 66 mL/min (by C-G formula based on SCr of 1.19 mg/dL). Liver Function Tests: Recent Labs  Lab 04/06/19 0957  AST 19  ALT 14  ALKPHOS 46  BILITOT 0.7  PROT 7.4  ALBUMIN 3.7   No results for input(s): LIPASE, AMYLASE in the last 168 hours. No results for input(s): AMMONIA in the last 168 hours. Coagulation Profile: No results for input(s): INR, PROTIME in the last 168 hours. Cardiac Enzymes: No results for input(s): CKTOTAL, CKMB, CKMBINDEX, TROPONINI in the last 168 hours. BNP (last 3 results) No results for input(s): PROBNP in the last 8760 hours. HbA1C: No results for input(s): HGBA1C in the last 72 hours. CBG: Recent Labs  Lab 04/04/19 0803 04/04/19 1157 04/04/19 1639 04/04/19 2104 04/05/19 0807  GLUCAP 107* 134* 97 91 90    Lipid Profile: No results for input(s): CHOL, HDL, LDLCALC, TRIG, CHOLHDL, LDLDIRECT in the last 72 hours. Thyroid Function Tests: No results for input(s): TSH, T4TOTAL, FREET4, T3FREE, THYROIDAB in the last 72 hours. Anemia Panel: No results for input(s): VITAMINB12, FOLATE, FERRITIN, TIBC, IRON, RETICCTPCT in the last 72 hours. Sepsis Labs: No results for input(s): PROCALCITON, LATICACIDVEN in the last 168 hours.  No results found for this or any previous visit (from the past 240 hour(s)).       Radiology Studies: No results found.      Scheduled Meds: . atorvastatin  10 mg Oral Daily  . chlorhexidine  15 mL Mouth/Throat BID  . enoxaparin (LOVENOX) injection  40 mg Subcutaneous Q24H  . escitalopram  10 mg Oral Daily  . feeding supplement (NEPRO CARB STEADY)  237 mL Oral TID BM  . feeding supplement (PRO-STAT SUGAR FREE 64)  30 mL Oral BID WC  . gabapentin  300 mg Oral TID  . mirtazapine  15 mg Oral QHS  . multivitamin with minerals  1 tablet Oral Daily  . prazosin  2 mg Oral QHS  . risperiDONE  3 mg Oral BID  . senna-docusate  1 tablet Oral Daily  . tamsulosin  0.4 mg Oral Daily  . traZODone  150 mg Oral QHS   Continuous Infusions:  LOS: 11 days    Time spent: 20 minutes    Sidney Ace, MD [facility] Pager [phone]  If 7PM-7AM, please contact night-coverage 04/09/2019, 10:03 AM

## 2019-04-09 NOTE — TOC Progression Note (Signed)
Transition of Care [facility]) - Progression Note    Patient Details  Name: Peter Parsons MRN: [id number] Date of Birth: 1961-02-02  Transition [facility]) CM/SW Contact  Shelbie Ammons, RN Phone Number: 04/09/2019, 2:12 PM  Clinical Narrative:   RNCM reached out to [facility] [place], [place], [facility], [place] [facility] [place], [place] and [place]; they all reported that they had received the faxed referrals but were unable to accept patient either due to lack of available beds or behavioral concerns.   RNCM left messages for return calls at [facility], [place] [facility] [place] [facility] [place], [place] [facility] [place], and [facility].   RNCM faxed clinicals to [facility] in [place] and [facility] [place] for review.   RNCM placed call to [facility] with [facility] to discuss recommendation clinicals be faxed to [facility] in [place], he reported that since clinicals had been faxed to another location he could see that they were sent over to [facility] for review. He further reported that typically corparate sets the guidelines for admission criteria so if they were unable to accept at one facility another would likely be the same.   Afternoon update: RNCM received notification that psychiatry has re-evaluated the patient and no longer finds him suicidal and notes improvement in his status. RNCM placed call to [facility] with [facility] to discuss, voice mail left for return call.          Expected Discharge Plan and Services                                                 Social Determinants of Health (SDOH) Interventions    Readmission Risk Interventions No flowsheet data found.

## 2019-04-09 NOTE — TOC Progression Note (Signed)
Transition of Care [facility]) - Progression Note    Patient Details  Name: Peter Parsons MRN: [id number] Date of Birth: 1961-01-27  Transition of Care [facility]) CM/SW [place], RN Phone Number: 04/09/2019, 2:04 PM  Clinical Narrative:   The patient is no longer having suicidal thoughts and ideations, still seeking a locked memory care unit for the patient, Psych  physician to update         Expected Discharge Plan and Services                                                 Social Determinants of Health (SDOH) Interventions    Readmission Risk Interventions No flowsheet data found.

## 2019-04-10 DIAGNOSIS — F200 Paranoid schizophrenia: Secondary | ICD-10-CM | POA: Diagnosis not present

## 2019-04-10 NOTE — Progress Notes (Signed)
PROGRESS NOTE    Peter Parsons  [id number] DOB: 12-Nov-1960 DOA: 02/11/2019 PCP: System, Provider Not In   Brief Narrative: Initially presented on 02/11/2019 from group home as he has suicidal ideation and was wandering off in the woods.  Prior to this patient had multiple ER visits with injuries after he ran out of his group home.  Patient was admitted at behavioral health for suicidal ideation in November 2020. Initial psychiatric evaluation recommended inpatient psychiatric admission at [facility] [place]. On 02/18/2019 psychiatric recommended patient is cleared from psychiatric point of view and now patient requires a locked memory care unit. While awaiting for placement in ER, EDP noted increasing confusion, generalized weakness and dysphagia and patient was referred for further work-up for admission. MRI brain negative for any acute stroke. Speech evaluation recommends dysphagia diet. Discussed with psychiatry IVC can be rescinded so it was performed on 04/07/2019.  Although the patient does not have medical decision-making capacity therefore cannot leave AMA.  2/26: Patient seen and examined.  No changes noted over interval.  Pending arrangement of memory care unit.  Patient does not have medical decision-making capacity and cannot leave AGAINST MEDICAL ADVICE  2/27: Patient seen and examined.  No acute medical changes over interval.  Patient seems a bit lethargic this morning.  Possibly medication effect.  Patient reevaluated by psychiatry yesterday.  IVC rescinded however patient will still require placement.  Case management aware and actively looking for placement options    Assessment & Plan:   Principal Problem:   Paranoid schizophrenia ([place]) Active Problems:   Altered mental status   Dementia ([place])   Weakness   Acute metabolic encephalopathy   Protein-calorie malnutrition, severe  1.  Dysphagia, dysarthria, left facial droop CVA ruled out with MRI. Currently still  has some dysphagia, pockets foods, but takes in fluids ok.    2.  Depression, anxiety, bipolar disorder and schizophrenia. Frequent suicidal ideation. Initially IVC. Patient definitely does not have any capacity to make medical decisions, and has a guardian. Patient was from a group home. --Continue Lexapro, Risperdal, trazodone --Continue prazosin --Hold Klonopin due to it causing extreme somnolence  Case management looking for memory care unit.  Patient cannot leave AMA.  3.  Type 2 diabetes mellitus uncontrolled with chronic kidney disease stage IIIa as well as peripheral neuropathy. Hold Metformin. Monitor renal function. Avoid nephrotoxic medications. --d/c fingersticks as pt has not needed much SSI.  4.  BPH Continue Flomax  5.  Dyslipidemia Continue statin  6.  Essential hypertension Patient was on lisinopril for renal protective effect but we discontinued that given his potassium is mildly elevated as well as renal function is elevated. Monitor blood pressure for now.  7.  B12 deficiency.  Folate deficiency --replaced with subcu B12 and oral replacement.  8. Severe Malnutritionrelated to social / environmental circumstances(inadequate oral intake)as evidenced by 21.6%weight loss over the past 2-3 months, moderate fat depletion, moderate-severe muscle depletion. --continue Remeron to attempt to enhance appetite -Calorie count shows that the patient is able to tolerate 100% of his require calorie due to increased intake of the nutritional supplementation.  Hoping for improvement down the road with the medicines.  DVT prophylaxis: Lovenox Code Status: Full Family Communication: None today, patient with legal guardian Disposition Plan: Unknown at this time.  Case management looking for memory care unit.  Consultants:   Psychiatry  Procedures:  None  Antimicrobials:  None   Subjective: Patient seen and examined No acute interval status  changes Lethargic this  AM, otherwise stable  Objective: Vitals:   04/09/19 1531 04/09/19 2104 04/09/19 2313 04/10/19 0744  BP: 109/71 96/65 107/66 126/86  Pulse: 61 63  (!) 101  Resp: 17 17  18   Temp: 98.4 F (36.9 C) 98.6 F (37 C)  99.1 F (37.3 C)  TempSrc: Axillary Oral  Oral  SpO2: 99% 98%  97%  Weight:      Height:        Intake/Output Summary (Last 24 hours) at 04/10/2019 1043 Last data filed at 04/10/2019 0942 Gross per 24 hour  Intake 480 ml  Output --  Net 480 ml   Filed Weights   02/11/19 1441 03/28/19 0306  Weight: 77.1 kg 70.8 kg    Examination:  General exam: Appears calm and comfortable  Respiratory system: Clear to auscultation. Respiratory effort normal. Cardiovascular system: S1 & S2 heard, RRR. No JVD, murmurs, rubs, gallops or clicks. No pedal edema. Gastrointestinal system: Abdomen is nondistended, soft and nontender. No organomegaly or masses felt. Normal bowel sounds heard. Central nervous system: Oriented x2.  No focal deficits Extremities: Symmetric 5 x 5 power. Skin: No rashes, lesions or ulcers Psychiatry: Flattened affect    Data Reviewed: I have personally reviewed following labs and imaging studies  CBC: Recent Labs  Lab 04/06/19 0957  WBC 5.7  HGB 11.2*  HCT 32.0*  MCV 87.0  PLT [id number]   Basic Metabolic Panel: Recent Labs  Lab 04/06/19 0957  NA 142  K 3.9  CL 100  CO2 32  GLUCOSE 130*  BUN 44*  CREATININE 1.19  CALCIUM 9.7  MG 1.8   GFR: Estimated Creatinine Clearance: 66 mL/min (by C-G formula based on SCr of 1.19 mg/dL). Liver Function Tests: Recent Labs  Lab 04/06/19 0957  AST 19  ALT 14  ALKPHOS 46  BILITOT 0.7  PROT 7.4  ALBUMIN 3.7   No results for input(s): LIPASE, AMYLASE in the last 168 hours. No results for input(s): AMMONIA in the last 168 hours. Coagulation Profile: No results for input(s): INR, PROTIME in the last 168 hours. Cardiac Enzymes: No results for input(s): CKTOTAL, CKMB, CKMBINDEX,  TROPONINI in the last 168 hours. BNP (last 3 results) No results for input(s): PROBNP in the last 8760 hours. HbA1C: No results for input(s): HGBA1C in the last 72 hours. CBG: Recent Labs  Lab 04/04/19 0803 04/04/19 1157 04/04/19 1639 04/04/19 2104 04/05/19 0807  GLUCAP 107* 134* 97 91 90   Lipid Profile: No results for input(s): CHOL, HDL, LDLCALC, TRIG, CHOLHDL, LDLDIRECT in the last 72 hours. Thyroid Function Tests: No results for input(s): TSH, T4TOTAL, FREET4, T3FREE, THYROIDAB in the last 72 hours. Anemia Panel: No results for input(s): VITAMINB12, FOLATE, FERRITIN, TIBC, IRON, RETICCTPCT in the last 72 hours. Sepsis Labs: No results for input(s): PROCALCITON, LATICACIDVEN in the last 168 hours.  No results found for this or any previous visit (from the past 240 hour(s)).       Radiology Studies: No results found.      Scheduled Meds: . atorvastatin  10 mg Oral Daily  . chlorhexidine  15 mL Mouth/Throat BID  . enoxaparin (LOVENOX) injection  40 mg Subcutaneous Q24H  . escitalopram  10 mg Oral Daily  . feeding supplement (NEPRO CARB STEADY)  237 mL Oral TID BM  . feeding supplement (PRO-STAT SUGAR FREE 64)  30 mL Oral BID WC  . gabapentin  300 mg Oral TID  . mirtazapine  15 mg Oral QHS  . multivitamin with minerals  1  tablet Oral Daily  . prazosin  2 mg Oral QHS  . risperiDONE  3 mg Oral BID  . senna-docusate  1 tablet Oral Daily  . tamsulosin  0.4 mg Oral Daily  . traZODone  150 mg Oral QHS   Continuous Infusions:   LOS: 12 days    Time spent: 20 minutes    Sidney Ace, MD [facility] Pager [phone]  If 7PM-7AM, please contact night-coverage 04/10/2019, 10:43 AM

## 2019-04-11 DIAGNOSIS — F200 Paranoid schizophrenia: Secondary | ICD-10-CM | POA: Diagnosis not present

## 2019-04-11 NOTE — Progress Notes (Signed)
Physical Therapy Treatment Patient Details Name: Peter Parsons MRN: [id number] DOB: Jan 15, 1961 Today's Date: 04/11/2019    History of Present Illness Pt is a 59 y.o. male initially presenting to hospital 02/11/20 with IVC from group home d/t wandering off into woods multiple times (found near water); pt reporting suicidal thoughts of drowning himself.  Pt tripped on his own feet and hit chin on chair on morning of 2/4 (pt got back up and walked to restroom independently).  Unable to return to group home d/t safety concerns.  Per chart review pt scored 19/30 on [facility] cognitive assessment with psychiatry indicative of moderate cognitive impairment (consistent with pt's diagnosis of dementia).  Pt was in [place] in ED for about 44 days prior to being admitted to hospital 2/13 for generalized weakness, dysphagia, and slurred speech.  MRI negative for acute CVA.  PMH includes DM, htn, schizophrenia.    PT Comments    Participated in exercises as described below.  Bed mobility with time and min guard.  Stood and walks 2 laps in room with short shuffling gait but no LOB.  Leaves walker to side and tries to unplug computer with verbal and tactile cues to leave it.  Returned to bed per tech request as he was up earlier and climbing unsafely out of the chair.   Follow Up Recommendations  SNF     Equipment Recommendations  Rolling walker with 5" wheels;3in1 (PT)    Recommendations for Other Services       Precautions / Restrictions Precautions Precautions: Fall Precaution Comments: Suicide precautions; aspiration precautions Restrictions Weight Bearing Restrictions: No    Mobility  Bed Mobility Overal bed mobility: Needs Assistance Bed Mobility: Sit to Supine     Supine to sit: Min assist Sit to supine: Min assist      Transfers Overall transfer level: Needs assistance Equipment used: Rolling walker (2 wheeled) Transfers: Sit to/from Stand Sit to Stand: Min assist             Ambulation/Gait Ambulation/Gait assistance: Herbalist (Feet): 70 Feet Assistive device: Rolling walker (2 wheeled) Gait Pattern/deviations: Step-through pattern;Decreased step length - right;Decreased step length - left;Trunk flexed Gait velocity: decreased   General Gait Details: short shuffling steps but generally steady   Marine scientist [place] (Stroke Patients Only)       Balance Overall balance assessment: Needs assistance Sitting-balance support: No upper extremity supported;Feet supported Sitting balance-Leahy Scale: Good     Standing balance support: Bilateral upper extremity supported Standing balance-Leahy Scale: Poor Standing balance comment: requires +1 assist at all times for safety but no LOB                            Cognition Arousal/Alertness: Awake/alert Behavior During Therapy: Flat affect Overall Cognitive Status: Difficult to assess                                 General Comments: Requires extra time to respond verbally.  OFten need to repeat self and provide extra time for patient processing.      Exercises Other Exercises Other Exercises: ankle pumps, heel slides, ab/add and slr x 10    General Comments        Pertinent Vitals/Pain Pain Assessment: No/denies pain    Home Living  Prior Function            PT Goals (current goals can now be found in the care plan section) Progress towards PT goals: Progressing toward goals    Frequency    Min 2X/week      PT Plan Current plan remains appropriate    Co-evaluation              AM-PAC PT "6 Clicks" Mobility   Outcome Measure  Help needed turning from your back to your side while in a flat bed without using bedrails?: A Little Help needed moving from lying on your back to sitting on the side of a flat bed without using bedrails?: A Little Help needed  moving to and from a bed to a chair (including a wheelchair)?: A Little Help needed standing up from a chair using your arms (e.g., wheelchair or bedside chair)?: A Little Help needed to walk in hospital room?: A Little Help needed climbing 3-5 steps with a railing? : A Lot 6 Click Score: 17    End of Session Equipment Utilized During Treatment: Gait belt Activity Tolerance: Patient tolerated treatment well Patient left: in bed;with call bell/phone within reach;with bed alarm set Nurse Communication: Mobility status       Time: 1215-1228 PT Time Calculation (min) (ACUTE ONLY): 13 min  Charges:  $Gait Training: 8-22 mins                    Chesley Noon, PTA 04/11/19, 1:01 PM

## 2019-04-11 NOTE — Progress Notes (Signed)
PROGRESS NOTE    Peter Parsons  [id number] DOB: 1960/09/04 DOA: 02/11/2019 PCP: System, Provider Not In   Brief Narrative: Initially presented on 02/11/2019 from group home as he has suicidal ideation and was wandering off in the woods.  Prior to this patient had multiple ER visits with injuries after he ran out of his group home.  Patient was admitted at behavioral health for suicidal ideation in November 2020. Initial psychiatric evaluation recommended inpatient psychiatric admission at [facility]. On 02/18/2019 psychiatric recommended patient is cleared from psychiatric point of view and now patient requires a locked memory care unit. While awaiting for placement in ER, EDP noted increasing confusion, generalized weakness and dysphagia and patient was referred for further work-up for admission. MRI brain negative for any acute stroke. Speech evaluation recommends dysphagia diet. Discussed with psychiatry IVC can be rescinded so it was performed on 04/07/2019.  Although the patient does not have medical decision-making capacity therefore cannot leave AMA.  2/26: Patient seen and examined.  No changes noted over interval.  Pending arrangement of memory care unit.  Patient does not have medical decision-making capacity and cannot leave AGAINST MEDICAL ADVICE  2/27: Patient seen and examined.  No acute medical changes over interval.  Patient seems a bit lethargic this morning.  Possibly medication effect.  Patient reevaluated by psychiatry yesterday.  IVC rescinded however patient will still require placement.  Case management aware and actively looking for placement options.  2/28: Patient seen and examined.  More alert awake this morning.  Still states he is confused however when prompted he is alert and oriented x3.  We continue to await placement.    Assessment & Plan:   Principal Problem:   Paranoid schizophrenia ([place]) Active Problems:   Altered mental status   Dementia  ([place])   Weakness   Acute metabolic encephalopathy   Protein-calorie malnutrition, severe  1.  Dysphagia, dysarthria, left facial droop CVA ruled out with MRI. Currently still has some dysphagia, pockets foods, but takes in fluids ok.    2.  Depression, anxiety, bipolar disorder and schizophrenia. Frequent suicidal ideation. Initially IVC. Patient definitely does not have any capacity to make medical decisions, and has a guardian. Patient was from a group home. --Continue Lexapro, Risperdal, trazodone --Continue prazosin --Hold Klonopin due to it causing extreme somnolence  Case management looking for memory care unit.  Patient cannot leave AMA.  3.  Type 2 diabetes mellitus uncontrolled with chronic kidney disease stage IIIa as well as peripheral neuropathy. Hold Metformin. Monitor renal function. Avoid nephrotoxic medications. --d/c fingersticks as pt has not needed much SSI.  4.  BPH Continue Flomax  5.  Dyslipidemia Continue statin  6.  Essential hypertension Patient was on lisinopril for renal protective effect but we discontinued that given his potassium is mildly elevated as well as renal function is elevated. Monitor blood pressure for now.  7.  B12 deficiency.  Folate deficiency --replaced with subcu B12 and oral replacement.  8. Severe Malnutritionrelated to social / environmental circumstances(inadequate oral intake)as evidenced by 21.6%weight loss over the past 2-3 months, moderate fat depletion, moderate-severe muscle depletion. --continue Remeron to attempt to enhance appetite -Calorie count shows that the patient is able to tolerate 100% of his require calorie due to increased intake of the nutritional supplementation.  Hoping for improvement down the road with the medicines.  DVT prophylaxis: Lovenox Code Status: Full Family Communication: None today, patient with legal guardian Disposition Plan: Unknown at this time.  Case management  looking for  memory care unit.  Consultants:   Psychiatry  Procedures:  None  Antimicrobials:  None   Subjective: Patient seen and examined No acute interval status changes More awake this morning, oriented x3  Objective: Vitals:   04/10/19 1613 04/10/19 2115 04/10/19 2338 04/11/19 0757  BP: 119/90 105/60 104/75 113/80  Pulse: 96  (!) 56 68  Resp: 18  18 18   Temp: 98.9 F (37.2 C)  97.8 F (36.6 C) 98.9 F (37.2 C)  TempSrc: Oral  Oral Oral  SpO2: 98%  98% 97%  Weight:      Height:        Intake/Output Summary (Last 24 hours) at 04/11/2019 1225 Last data filed at 04/11/2019 1044 Gross per 24 hour  Intake 860 ml  Output 1080 ml  Net -220 ml   Filed Weights   02/11/19 1441 03/28/19 0306  Weight: 77.1 kg 70.8 kg    Examination:  General exam: Appears calm and comfortable  Respiratory system: Clear to auscultation. Respiratory effort normal. Cardiovascular system: S1 & S2 heard, RRR. No JVD, murmurs, rubs, gallops or clicks. No pedal edema. Gastrointestinal system: Abdomen is nondistended, soft and nontender. No organomegaly or masses felt. Normal bowel sounds heard. Central nervous system: Oriented x2.  No focal deficits Extremities: Symmetric 5 x 5 power. Skin: No rashes, lesions or ulcers Psychiatry: Flattened affect    Data Reviewed: I have personally reviewed following labs and imaging studies  CBC: Recent Labs  Lab 04/06/19 0957  WBC 5.7  HGB 11.2*  HCT 32.0*  MCV 87.0  PLT [id number]   Basic Metabolic Panel: Recent Labs  Lab 04/06/19 0957  NA 142  K 3.9  CL 100  CO2 32  GLUCOSE 130*  BUN 44*  CREATININE 1.19  CALCIUM 9.7  MG 1.8   GFR: Estimated Creatinine Clearance: 66 mL/min (by C-G formula based on SCr of 1.19 mg/dL). Liver Function Tests: Recent Labs  Lab 04/06/19 0957  AST 19  ALT 14  ALKPHOS 46  BILITOT 0.7  PROT 7.4  ALBUMIN 3.7   No results for input(s): LIPASE, AMYLASE in the last 168 hours. No results for input(s): AMMONIA in  the last 168 hours. Coagulation Profile: No results for input(s): INR, PROTIME in the last 168 hours. Cardiac Enzymes: No results for input(s): CKTOTAL, CKMB, CKMBINDEX, TROPONINI in the last 168 hours. BNP (last 3 results) No results for input(s): PROBNP in the last 8760 hours. HbA1C: No results for input(s): HGBA1C in the last 72 hours. CBG: Recent Labs  Lab 04/04/19 1639 04/04/19 2104 04/05/19 0807  GLUCAP 97 91 90   Lipid Profile: No results for input(s): CHOL, HDL, LDLCALC, TRIG, CHOLHDL, LDLDIRECT in the last 72 hours. Thyroid Function Tests: No results for input(s): TSH, T4TOTAL, FREET4, T3FREE, THYROIDAB in the last 72 hours. Anemia Panel: No results for input(s): VITAMINB12, FOLATE, FERRITIN, TIBC, IRON, RETICCTPCT in the last 72 hours. Sepsis Labs: No results for input(s): PROCALCITON, LATICACIDVEN in the last 168 hours.  No results found for this or any previous visit (from the past 240 hour(s)).       Radiology Studies: No results found.      Scheduled Meds: . atorvastatin  10 mg Oral Daily  . chlorhexidine  15 mL Mouth/Throat BID  . enoxaparin (LOVENOX) injection  40 mg Subcutaneous Q24H  . escitalopram  10 mg Oral Daily  . feeding supplement (NEPRO CARB STEADY)  237 mL Oral TID BM  . feeding supplement (PRO-STAT SUGAR FREE 64)  30 mL Oral BID WC  . gabapentin  300 mg Oral TID  . mirtazapine  15 mg Oral QHS  . multivitamin with minerals  1 tablet Oral Daily  . prazosin  2 mg Oral QHS  . risperiDONE  3 mg Oral BID  . senna-docusate  1 tablet Oral Daily  . tamsulosin  0.4 mg Oral Daily  . traZODone  150 mg Oral QHS   Continuous Infusions:   LOS: 13 days    Time spent: 20 minutes    Sidney Ace, MD [facility] Pager [phone]  If 7PM-7AM, please contact night-coverage 04/11/2019, 12:25 PM

## 2019-04-11 NOTE — Progress Notes (Signed)
Received order for PIV start. No IV medications or tests ordered at this time. IV team does not start IVs to have just in case for vein preservation. Please consult IV team if IV needed in the future.

## 2019-04-12 DIAGNOSIS — F200 Paranoid schizophrenia: Secondary | ICD-10-CM | POA: Diagnosis not present

## 2019-04-12 NOTE — Progress Notes (Signed)
Nutrition Follow-up  DOCUMENTATION CODES:   Severe malnutrition in context of social or environmental circumstances  INTERVENTION:  Continue Nepro Shake po TID, each supplement provides 425 kcal and 19 grams protein  Continue Pro-Stat 30 ml po BID each meals, each supplement provides 100 kcal and 15 grams of protein  Continue Magic cup TID with meals, each supplement provides 290 kcal and 9 grams of protein  Continue daily MVI  NUTRITION DIAGNOSIS:   Severe Malnutrition related to social / environmental circumstances(inadequate oral intake) as evidenced by percent weight loss, moderate fat depletion, moderate muscle depletion, severe muscle depletion.  Ongoing.  GOAL:   Patient will meet greater than or equal to 90% of their needs  Progressing.  MONITOR:   PO intake, Supplement acceptance, Labs, Weight trends, I & O's  REASON FOR ASSESSMENT:   Malnutrition Screening Tool    ASSESSMENT:   59 year old male with PMHx of schizophrenia, HTN, DM who has been in [place] for 44 days and is now admitted with generalized weakness, dysphagia, slurred speech, left facial droop to rule out CVA.  Patient has ongoing variable intake at meals (0-50%; up to 75% at one meal). Per calorie count completed on 2/25 patient was able to meet 100% of his calorie needs mainly due to his intake of oral nutrition supplements. According to chart he continues to drink 2-3 bottles of Nepro per day and is drinking both Pro-Stat supplements. He also receives [employer] on trays. Pending placement.  Medications reviewed and include: gabapentin, Remeron 15 mg QHS, MVI daily, senna-docusate 1 tablet daily, Flomax.  Labs reviewed: CBG 90-134, BUN 44.  Diet Order:   Diet Order            DIET - DYS 1 Room service appropriate? Yes with Assist; Fluid consistency: Thin  Diet effective now             EDUCATION NEEDS:   No education needs have been identified at this time  Skin:  Skin Assessment: Skin  Integrity Issues:(Stg II coccyx)  Last BM:  04/08/2019 per chart  Height:   Ht Readings from Last 1 Encounters:  03/28/19 5\' 9"  (1.753 m)   Weight:   Wt Readings from Last 1 Encounters:  03/28/19 70.8 kg   Ideal Body Weight:  72.7 kg  BMI:  Body mass index is 23.05 kg/m.  Estimated Nutritional Needs:   Kcal:  1800-2000  Protein:  90-100 grams  Fluid:  1.8-2 L/day  Jacklynn Barnacle, MS, RD, LDN Pager number available on Amion

## 2019-04-12 NOTE — Plan of Care (Signed)
Patient noted with adequate intake this shift, 0 reports of pain or request for pain medication this shift.

## 2019-04-12 NOTE — Progress Notes (Signed)
Physical Therapy Treatment Patient Details Name: Peter Parsons MRN: [id number] DOB: [birth year] Today's Date: 04/12/2019    History of Present Illness Pt is a 59 y.o. male initially presenting to hospital 02/11/20 with IVC from group home d/t wandering off into woods multiple times (found near water); pt reporting suicidal thoughts of drowning himself.  Pt tripped on his own feet and hit chin on chair on morning of 2/4 (pt got back up and walked to restroom independently).  Unable to return to group home d/t safety concerns.  Per chart review pt scored 19/30 on [facility] cognitive assessment with psychiatry indicative of moderate cognitive impairment (consistent with pt's diagnosis of dementia).  Pt was in [place] in ED for about 44 days prior to being admitted to hospital 2/13 for generalized weakness, dysphagia, and slurred speech.  MRI negative for acute CVA.  PMH includes DM, htn, schizophrenia.    PT Comments    Pt able to progress to ambulating 200 feet with RW CGA; shorter shuffling steps with narrow BOS noted but pt overall steady.  CGA to stand up to walker but min assist for balance upon standing d/t posterior lean (assist to shift weight forward).  Trialed hand hold assist x2 with ambulation 40 feet but pt reporting preferring using RW at this time (no significant change in gait mechanics noted).  HR 90-101 bpm with activity.  Will continue to focus on strengthening and progressive functional mobility during hospitalization.    Follow Up Recommendations  Home health PT;Supervision/Assistance - 24 hour     Equipment Recommendations  Rolling walker with 5" wheels;3in1 (PT)    Recommendations for Other Services       Precautions / Restrictions Precautions Precautions: Fall Precaution Comments: Suicide precautions; aspiration precautions Restrictions Weight Bearing Restrictions: No    Mobility  Bed Mobility Overal bed mobility: Needs Assistance Bed Mobility: Supine to Sit      Supine to sit: Min assist;Mod assist;HOB elevated     General bed mobility comments: assist to initiate movement to sit up onto edge of bed (and then pt would assist with movement)  Transfers Overall transfer level: Needs assistance Equipment used: Rolling walker (2 wheeled) Transfers: Sit to/from [facility] Sit to Stand: Min guard;Min assist Stand pivot transfers: Min guard       General transfer comment: CGA to stand from bed and from recliner with RW but min assist required once standing d/t posterior lean requiring assist to shift weight forward; hand hold assist x2 for sit to/from stand from bed (no RW use)  Ambulation/Gait Ambulation/Gait assistance: Min guard;Min assist Gait Distance (Feet): 200 Feet(plus 40 feet) Assistive device: Rolling walker (2 wheeled);2 person hand held assist   Gait velocity: decreased   General Gait Details: x200 feet with RW CGA (shorter shuffling steps with narrow BOS but overall steady); x40 feet B hand hold assist (min assist)--similar gait as with walker use   Stairs             Wheelchair Mobility    Modified Rankin (Stroke Patients Only)       Balance Overall balance assessment: Needs assistance Sitting-balance support: No upper extremity supported;Feet supported Sitting balance-Leahy Scale: Good Sitting balance - Comments: steady sitting reaching within BOS Postural control: Posterior lean Standing balance support: Bilateral upper extremity supported Standing balance-Leahy Scale: Poor Standing balance comment: posterior lean noted upon standing requiring min assist to shift weight forward  Cognition Arousal/Alertness: Awake/alert Behavior During Therapy: Flat affect                                   General Comments: Extra time required for pt to verbally respond (and pt did not respond at all at times); vc's and tactile cues required; extra time  required for pt processing      Exercises      General Comments   Nursing cleared pt for participation in physical therapy.  Pt agreeable to PT session with encouragement.      Pertinent Vitals/Pain Pain Assessment: Faces Faces Pain Scale: No hurt Pain Intervention(s): Limited activity within patient's tolerance;Monitored during session;Repositioned  O2 sats WFL during session's activities (on room air)    Home Living                      Prior Function            PT Goals (current goals can now be found in the care plan section) Acute Rehab PT Goals Patient Stated Goal: to improve strength/balance PT Goal Formulation: With patient Time For Goal Achievement: 04/20/19 Potential to Achieve Goals: Good Progress towards PT goals: Progressing toward goals    Frequency    Min 2X/week      PT Plan Discharge plan needs to be updated    Co-evaluation              AM-PAC PT "6 Clicks" Mobility   Outcome Measure  Help needed turning from your back to your side while in a flat bed without using bedrails?: A Little Help needed moving from lying on your back to sitting on the side of a flat bed without using bedrails?: A Little Help needed moving to and from a bed to a chair (including a wheelchair)?: A Little Help needed standing up from a chair using your arms (e.g., wheelchair or bedside chair)?: A Little Help needed to walk in hospital room?: A Little Help needed climbing 3-5 steps with a railing? : A Little 6 Click Score: 18    End of Session Equipment Utilized During Treatment: Gait belt Activity Tolerance: Patient tolerated treatment well Patient left: (sitting on edge of bed with OT present) Nurse Communication: Mobility status;Precautions PT Visit Diagnosis: Other abnormalities of gait and mobility (R26.89);Muscle weakness (generalized) (M62.81);History of falling (Z91.81);Difficulty in walking, not elsewhere classified (R26.2)     Time:  [id number] PT Time Calculation (min) (ACUTE ONLY): 23 min  Charges:  $Gait Training: 8-22 mins $Therapeutic Exercise: 8-22 mins                     Leitha Bleak, PT 04/12/19, 3:06 PM

## 2019-04-12 NOTE — TOC Progression Note (Signed)
Transition of Care [facility]) - Progression Note    Patient Details  Name: Peter Parsons MRN: [id number] Date of Birth: 23-Jun-1960  Transition of Care [facility]) CM/SW [place], RN Phone Number: 04/12/2019, 8:41 AM  Clinical Narrative:   Marlena Clipper at the [place] and requested him to consider this patient for the locked memory care unit, awaiting an answer         Expected Discharge Plan and Services                                                 Social Determinants of Health (SDOH) Interventions    Readmission Risk Interventions No flowsheet data found.

## 2019-04-12 NOTE — Progress Notes (Signed)
PROGRESS NOTE    Peter Parsons  [id number] DOB: 04-03-1960 DOA: 02/11/2019 PCP: System, Provider Not In   Brief Narrative: Initially presented on 02/11/2019 from group home as he has suicidal ideation and was wandering off in the woods.  Prior to this patient had multiple ER visits with injuries after he ran out of his group home.  Patient was admitted at behavioral health for suicidal ideation in November 2020. Initial psychiatric evaluation recommended inpatient psychiatric admission at [facility]. On 02/18/2019 psychiatric recommended patient is cleared from psychiatric point of view and now patient requires a locked memory care unit. While awaiting for placement in ER, EDP noted increasing confusion, generalized weakness and dysphagia and patient was referred for further work-up for admission. MRI brain negative for any acute stroke. Speech evaluation recommends dysphagia diet. Discussed with psychiatry IVC can be rescinded so it was performed on 04/07/2019.  Although the patient does not have medical decision-making capacity therefore cannot leave AMA.  2/26: Patient seen and examined.  No changes noted over interval.  Pending arrangement of memory care unit.  Patient does not have medical decision-making capacity and cannot leave AGAINST MEDICAL ADVICE  2/27: Patient seen and examined.  No acute medical changes over interval.  Patient seems a bit lethargic this morning.  Possibly medication effect.  Patient reevaluated by psychiatry yesterday.  IVC rescinded however patient will still require placement.  Case management aware and actively looking for placement options.  2/28: Patient seen and examined.  More alert awake this morning.  Still states he is confused however when prompted he is alert and oriented x3.  We continue to await placement.  3/1: Patient seen and examined.  Lethargic this morning but easily awakened.  Continues to endorse confusion.  Awaiting placement.     Assessment & Plan:   Principal Problem:   Paranoid schizophrenia ([place]) Active Problems:   Altered mental status   Dementia ([place])   Weakness   Acute metabolic encephalopathy   Protein-calorie malnutrition, severe  1.  Dysphagia, dysarthria, left facial droop CVA ruled out with MRI. Currently still has some dysphagia, pockets foods, but takes in fluids ok.    2.  Depression, anxiety, bipolar disorder and schizophrenia. Frequent suicidal ideation. Initially IVC. Patient definitely does not have any capacity to make medical decisions, and has a guardian. Patient was from a group home. --Continue Lexapro, Risperdal, trazodone --Continue prazosin --Hold Klonopin due to it causing extreme somnolence  Case management looking for memory care unit.  Patient cannot leave AMA.  3.  Type 2 diabetes mellitus uncontrolled with chronic kidney disease stage IIIa as well as peripheral neuropathy. Hold Metformin. Monitor renal function. Avoid nephrotoxic medications. --d/c fingersticks as pt has not needed much SSI.  4.  BPH Continue Flomax  5.  Dyslipidemia Continue statin  6.  Essential hypertension Patient was on lisinopril for renal protective effect but we discontinued that given his potassium is mildly elevated as well as renal function is elevated. Monitor blood pressure for now.  7.  B12 deficiency.  Folate deficiency --replaced with subcu B12 and oral replacement.  8. Severe Malnutritionrelated to social / environmental circumstances(inadequate oral intake)as evidenced by 21.6%weight loss over the past 2-3 months, moderate fat depletion, moderate-severe muscle depletion. --continue Remeron to attempt to enhance appetite -Calorie count shows that the patient is able to tolerate 100% of his require calorie due to increased intake of the nutritional supplementation.  Hoping for improvement down the road with the medicines.  DVT prophylaxis:  Lovenox Code Status: Full  Family Communication: None today, patient with legal guardian Disposition Plan: Unknown at this time.  Case management looking for memory care unit.  Consultants:   Psychiatry  Procedures:  None  Antimicrobials:  None   Subjective: Patient seen and examined No acute interval status changes No new complaints  Objective: Vitals:   04/11/19 1628 04/11/19 2008 04/11/19 2322 04/12/19 0735  BP: 126/82 117/85 110/74 106/72  Pulse: (!) 101 82 68 61  Resp: 18 16 16 16   Temp: 98.9 F (37.2 C) 98.6 F (37 C) 98.8 F (37.1 C) 98.5 F (36.9 C)  TempSrc: Oral Oral Oral Axillary  SpO2: 98% 99% 98% 98%  Weight:      Height:        Intake/Output Summary (Last 24 hours) at 04/12/2019 1118 Last data filed at 04/11/2019 1922 Gross per 24 hour  Intake 240 ml  Output 625 ml  Net -385 ml   Filed Weights   02/11/19 1441 03/28/19 0306  Weight: 77.1 kg 70.8 kg    Examination:  General exam: Appears calm and comfortable  Respiratory system: Clear to auscultation. Respiratory effort normal. Cardiovascular system: S1 & S2 heard, RRR. No JVD, murmurs, rubs, gallops or clicks. No pedal edema. Gastrointestinal system: Abdomen is nondistended, soft and nontender. No organomegaly or masses felt. Normal bowel sounds heard. Central nervous system: Oriented x2.  No focal deficits Extremities: Symmetric 5 x 5 power. Skin: No rashes, lesions or ulcers Psychiatry: Flattened affect    Data Reviewed: I have personally reviewed following labs and imaging studies  CBC: Recent Labs  Lab 04/06/19 0957  WBC 5.7  HGB 11.2*  HCT 32.0*  MCV 87.0  PLT [id number]   Basic Metabolic Panel: Recent Labs  Lab 04/06/19 0957  NA 142  K 3.9  CL 100  CO2 32  GLUCOSE 130*  BUN 44*  CREATININE 1.19  CALCIUM 9.7  MG 1.8   GFR: Estimated Creatinine Clearance: 66 mL/min (by C-G formula based on SCr of 1.19 mg/dL). Liver Function Tests: Recent Labs  Lab 04/06/19 0957  AST 19  ALT 14  ALKPHOS 46   BILITOT 0.7  PROT 7.4  ALBUMIN 3.7   No results for input(s): LIPASE, AMYLASE in the last 168 hours. No results for input(s): AMMONIA in the last 168 hours. Coagulation Profile: No results for input(s): INR, PROTIME in the last 168 hours. Cardiac Enzymes: No results for input(s): CKTOTAL, CKMB, CKMBINDEX, TROPONINI in the last 168 hours. BNP (last 3 results) No results for input(s): PROBNP in the last 8760 hours. HbA1C: No results for input(s): HGBA1C in the last 72 hours. CBG: No results for input(s): GLUCAP in the last 168 hours. Lipid Profile: No results for input(s): CHOL, HDL, LDLCALC, TRIG, CHOLHDL, LDLDIRECT in the last 72 hours. Thyroid Function Tests: No results for input(s): TSH, T4TOTAL, FREET4, T3FREE, THYROIDAB in the last 72 hours. Anemia Panel: No results for input(s): VITAMINB12, FOLATE, FERRITIN, TIBC, IRON, RETICCTPCT in the last 72 hours. Sepsis Labs: No results for input(s): PROCALCITON, LATICACIDVEN in the last 168 hours.  No results found for this or any previous visit (from the past 240 hour(s)).       Radiology Studies: No results found.      Scheduled Meds: . atorvastatin  10 mg Oral Daily  . chlorhexidine  15 mL Mouth/Throat BID  . enoxaparin (LOVENOX) injection  40 mg Subcutaneous Q24H  . escitalopram  10 mg Oral Daily  . feeding supplement (NEPRO CARB STEADY)  237 mL Oral TID BM  . feeding supplement (PRO-STAT SUGAR FREE 64)  30 mL Oral BID WC  . gabapentin  300 mg Oral TID  . mirtazapine  15 mg Oral QHS  . multivitamin with minerals  1 tablet Oral Daily  . prazosin  2 mg Oral QHS  . risperiDONE  3 mg Oral BID  . senna-docusate  1 tablet Oral Daily  . tamsulosin  0.4 mg Oral Daily  . traZODone  150 mg Oral QHS   Continuous Infusions:   LOS: 14 days    Time spent: 20 minutes    Sidney Ace, MD [facility] Pager [phone]  If 7PM-7AM, please contact night-coverage 04/12/2019, 11:18 AM

## 2019-04-12 NOTE — Progress Notes (Signed)
Occupational Therapy Treatment Patient Details Name: Peter Parsons MRN: [id number] DOB: 02/13/1960 Today's Date: 04/12/2019    History of present illness Pt is a 59 y.o. male initially presenting to hospital 02/11/20 with IVC from group home d/t wandering off into woods multiple times (found near water); pt reporting suicidal thoughts of drowning himself.  Pt tripped on his own feet and hit chin on chair on morning of 2/4 (pt got back up and walked to restroom independently).  Unable to return to group home d/t safety concerns.  Per chart review pt scored 19/30 on [facility] cognitive assessment with psychiatry indicative of moderate cognitive impairment (consistent with pt's diagnosis of dementia).  Pt was in [place] in ED for about 44 days prior to being admitted to hospital 2/13 for generalized weakness, dysphagia, and slurred speech.  MRI negative for acute CVA.  PMH includes DM, htn, schizophrenia.   OT comments  Patient sitting up in bed, having just completed PT session, upon entry.  Patient demonstrated improved ability to hold conversation/verbalize needs appropriately.  Demonstrated improved follow through with therapist's education/cues.  Patient participated in simple grooming while seated EOB.  Demonstrates low ambition to complete at his highest level of independence.  Completed cervical/thoracic stretches while sitting and standing to improve posture needed for safe completion of ADLs, functional transfers/mobility.  Required verbal/visual/tactile cues for correct follow through.  Completed sit to stand with MIN A d/t posterior lean and required use of RW for UE support in standing.  No complaints of pain.  Provided education on importance of sitting up throughout day to improve overall health and wellbeing.  Patient verbalized understanding, but requested to return to bed.  Patient would continue to benefit from skilled OT to address ADL retraining, activity tolerance, EC techniques and safety  awareness.  Based on today's performance, d/c recommendation remains appropriate.     Follow Up Recommendations  Home health OT;Supervision/Assistance - 24 hour    Equipment Recommendations  3 in 1 bedside commode;Other (comment)    Recommendations for Other Services      Precautions / Restrictions Precautions Precautions: Fall Precaution Comments: Suicide precautions; aspiration precautions Restrictions Weight Bearing Restrictions: No       Mobility Bed Mobility Overal bed mobility: Needs Assistance Bed Mobility: Sit to Supine      Sit to supine: Min assist   General bed mobility comments: Required assistance with bridging to move hips/core laterally while in bed  Transfers Overall transfer level: Needs assistance Equipment used: Rolling walker (2 wheeled) Transfers: Sit to/from Stand Sit to Stand: Min assist        General transfer comment: MIN A needed this date due to posterior lean. Improved ability to follow education/cues on body mechanics.    Balance Overall balance assessment: Needs assistance Sitting-balance support: No upper extremity supported;Feet supported Sitting balance-Leahy Scale: Good  Postural control: Posterior lean Standing balance support: Bilateral upper extremity supported Standing balance-Leahy Scale: Poor Standing balance comment: Posterior lean in standing.  Requires tactile/verbal cues and MIN A to correct.                           ADL either performed or assessed with clinical judgement   ADL Overall ADL's : Needs assistance/impaired     Grooming: Wash/dry hands;Brushing hair;Maximal assistance Grooming Details (indicate cue type and reason): Completed sitting at side of bed.  Patient prefers to have therapist complete task and did not attempt to perform independently.  General ADL Comments: Requires encouragement to complete ADLs at his highest level of independence.      Vision Patient Visual Report: No change from baseline     Perception     Praxis      Cognition Arousal/Alertness: Awake/alert Behavior During Therapy: Flat affect Overall Cognitive Status: Within Functional Limits for tasks assessed                                 General Comments: Demonstrated improved ability to maintain appropriate conversation during therapeutic activities.  Required less time for processing.        Exercises Other Exercises Other Exercises: Completed UB postural neck/thoracic stretches in sitting and standing to improve safety during functional transfers and mobility. Other Exercises: Educated patient on body mechanics during functional transfers. Other Exercises: Educated patient on importance of sitting up in recliner throughout day for overall health and wellness   Shoulder Instructions       General Comments Demonstrated improved participation in therapy and ability to conversate appropriately/verbalize concerns or needs.    Pertinent Vitals/ Pain       Pain Assessment: No/denies pain   Home Living                                          Prior Functioning/Environment              Frequency  Min 1X/week        Progress Toward Goals  OT Goals(current goals can now be found in the care plan section)  Progress towards OT goals: Progressing toward goals  Acute Rehab OT Goals Patient Stated Goal: to improve strength/balance OT Goal Formulation: With patient Time For Goal Achievement: 04/12/19 Potential to Achieve Goals: Good  Plan Discharge plan remains appropriate;Frequency needs to be updated    Co-evaluation                 AM-PAC OT "6 Clicks" Daily Activity     Outcome Measure   Help from another person eating meals?: None Help from another person taking care of personal grooming?: A Little(Demonstrates physical capability, but demonstrates low ambition) Help from another person  toileting, which includes using toliet, bedpan, or urinal?: A Little Help from another person bathing (including washing, rinsing, drying)?: A Lot Help from another person to put on and taking off regular upper body clothing?: A Little Help from another person to put on and taking off regular lower body clothing?: A Little 6 Click Score: 18    End of Session Equipment Utilized During Treatment: Rolling walker  OT Visit Diagnosis: Unsteadiness on feet (R26.81);Muscle weakness (generalized) (M62.81)   Activity Tolerance Patient tolerated treatment well;No increased pain   Patient Left in bed;with call bell/phone within reach;with bed alarm set   Nurse Communication          Time: [id number] OT Time Calculation (min): 21 min  Charges: OT General Charges $OT Visit: 1 Visit OT Treatments $Self Care/Home Management : 8-22 mins $Therapeutic Exercise: 8-22 mins  Baldomero Lamy, MS, OTR/L 04/12/19, 3:36 PM

## 2019-04-13 DIAGNOSIS — L8990 Pressure ulcer of unspecified site, unspecified stage: Secondary | ICD-10-CM | POA: Diagnosis present

## 2019-04-13 DIAGNOSIS — F200 Paranoid schizophrenia: Secondary | ICD-10-CM | POA: Diagnosis not present

## 2019-04-13 NOTE — Progress Notes (Signed)
PROGRESS NOTE    Peter Parsons  [id number] DOB: 1960-12-10 DOA: 02/11/2019 PCP: System, Provider Not In   Brief Narrative: Initially presented on 02/11/2019 from group home as he has suicidal ideation and was wandering off in the woods.  Prior to this patient had multiple ER visits with injuries after he ran out of his group home.  Patient was admitted at behavioral health for suicidal ideation in November 2020. Initial psychiatric evaluation recommended inpatient psychiatric admission at [facility]. On 02/18/2019 psychiatric recommended patient is cleared from psychiatric point of view and now patient requires a locked memory care unit. While awaiting for placement in ER, EDP noted increasing confusion, generalized weakness and dysphagia and patient was referred for further work-up for admission. MRI brain negative for any acute stroke. Speech evaluation recommends dysphagia diet. Discussed with psychiatry IVC can be rescinded so it was performed on 04/07/2019.  Although the patient does not have medical decision-making capacity therefore cannot leave AMA.  2/26: Patient seen and examined.  No changes noted over interval.  Pending arrangement of memory care unit.  Patient does not have medical decision-making capacity and cannot leave AGAINST MEDICAL ADVICE  2/27: Patient seen and examined.  No acute medical changes over interval.  Patient seems a bit lethargic this morning.  Possibly medication effect.  Patient reevaluated by psychiatry yesterday.  IVC rescinded however patient will still require placement.  Case management aware and actively looking for placement options.  2/28: Patient seen and examined.  More alert awake this morning.  Still states he is confused however when prompted he is alert and oriented x3.  We continue to await placement.  3/1: Patient seen and examined.  Lethargic this morning but easily awakened.  Continues to endorse confusion.  Awaiting  placement.  3/2: Patient seen and examined.  Lethargic this morning.  Easily awakened.  Awaiting placement    Assessment & Plan:   Principal Problem:   Paranoid schizophrenia ([place]) Active Problems:   Altered mental status   Dementia ([place])   Weakness   Acute metabolic encephalopathy   Protein-calorie malnutrition, severe   Pressure injury of skin  1.  Dysphagia, dysarthria, left facial droop CVA ruled out with MRI. Currently still has some dysphagia, pockets foods, but takes in fluids ok.    2.  Depression, anxiety, bipolar disorder and schizophrenia. Frequent suicidal ideation. Initially IVC. Patient definitely does not have any capacity to make medical decisions, and has a guardian. Patient was from a group home. --Continue Lexapro, Risperdal, trazodone --Continue prazosin --Hold Klonopin due to it causing extreme somnolence  Case management looking for memory care unit.  Patient cannot leave AMA.  3.  Type 2 diabetes mellitus uncontrolled with chronic kidney disease stage IIIa as well as peripheral neuropathy. Hold Metformin. Monitor renal function. Avoid nephrotoxic medications. --d/c fingersticks as pt has not needed much SSI.  4.  BPH Continue Flomax  5.  Dyslipidemia Continue statin  6.  Essential hypertension Patient was on lisinopril for renal protective effect but we discontinued that given his potassium is mildly elevated as well as renal function is elevated. Monitor blood pressure for now.  7.  B12 deficiency.  Folate deficiency --replaced with subcu B12 and oral replacement.  8. Severe Malnutritionrelated to social / environmental circumstances(inadequate oral intake)as evidenced by 21.6%weight loss over the past 2-3 months, moderate fat depletion, moderate-severe muscle depletion. --continue Remeron to attempt to enhance appetite -Calorie count shows that the patient is able to tolerate 100% of his require  calorie due to increased intake of  the nutritional supplementation.  Hoping for improvement down the road with the medicines.  DVT prophylaxis: Lovenox Code Status: Full Family Communication: None today, patient with legal guardian Disposition Plan: Unknown at this time.  Case management looking for memory care unit.  Consultants:   Psychiatry  Procedures:  None  Antimicrobials:  None   Subjective: Patient seen and examined No acute interval status changes No new complaints  Objective: Vitals:   04/12/19 1935 04/12/19 2318 04/13/19 0341 04/13/19 0751  BP: (!) 102/57 118/74 109/67 105/70  Pulse: 69 95 (!) 53 61  Resp: 16 16 14 17   Temp: 99.1 F (37.3 C) (!) 97.4 F (36.3 C) (!) 97.5 F (36.4 C) 97.6 F (36.4 C)  TempSrc:  Oral Oral Axillary  SpO2: 98% 98% 99% 98%  Weight:      Height:        Intake/Output Summary (Last 24 hours) at 04/13/2019 1234 Last data filed at 04/12/2019 1922 Gross per 24 hour  Intake 480 ml  Output 400 ml  Net 80 ml   Filed Weights   02/11/19 1441 03/28/19 0306  Weight: 77.1 kg 70.8 kg    Examination:  General exam: Appears calm and comfortable  Respiratory system: Clear to auscultation. Respiratory effort normal. Cardiovascular system: S1 & S2 heard, RRR. No JVD, murmurs, rubs, gallops or clicks. No pedal edema. Gastrointestinal system: Abdomen is nondistended, soft and nontender. No organomegaly or masses felt. Normal bowel sounds heard. Central nervous system: Oriented x2.  No focal deficits Extremities: Symmetric 5 x 5 power. Skin: No rashes, lesions or ulcers Psychiatry: Flattened affect    Data Reviewed: I have personally reviewed following labs and imaging studies  CBC: No results for input(s): WBC, NEUTROABS, HGB, HCT, MCV, PLT in the last 168 hours. Basic Metabolic Panel: No results for input(s): NA, K, CL, CO2, GLUCOSE, BUN, CREATININE, CALCIUM, MG, PHOS in the last 168 hours. GFR: Estimated Creatinine Clearance: 66 mL/min (by C-G formula based on  SCr of 1.19 mg/dL). Liver Function Tests: No results for input(s): AST, ALT, ALKPHOS, BILITOT, PROT, ALBUMIN in the last 168 hours. No results for input(s): LIPASE, AMYLASE in the last 168 hours. No results for input(s): AMMONIA in the last 168 hours. Coagulation Profile: No results for input(s): INR, PROTIME in the last 168 hours. Cardiac Enzymes: No results for input(s): CKTOTAL, CKMB, CKMBINDEX, TROPONINI in the last 168 hours. BNP (last 3 results) No results for input(s): PROBNP in the last 8760 hours. HbA1C: No results for input(s): HGBA1C in the last 72 hours. CBG: No results for input(s): GLUCAP in the last 168 hours. Lipid Profile: No results for input(s): CHOL, HDL, LDLCALC, TRIG, CHOLHDL, LDLDIRECT in the last 72 hours. Thyroid Function Tests: No results for input(s): TSH, T4TOTAL, FREET4, T3FREE, THYROIDAB in the last 72 hours. Anemia Panel: No results for input(s): VITAMINB12, FOLATE, FERRITIN, TIBC, IRON, RETICCTPCT in the last 72 hours. Sepsis Labs: No results for input(s): PROCALCITON, LATICACIDVEN in the last 168 hours.  No results found for this or any previous visit (from the past 240 hour(s)).       Radiology Studies: No results found.      Scheduled Meds: . atorvastatin  10 mg Oral Daily  . chlorhexidine  15 mL Mouth/Throat BID  . enoxaparin (LOVENOX) injection  40 mg Subcutaneous Q24H  . escitalopram  10 mg Oral Daily  . feeding supplement (NEPRO CARB STEADY)  237 mL Oral TID BM  . feeding supplement (  PRO-STAT SUGAR FREE 64)  30 mL Oral BID WC  . gabapentin  300 mg Oral TID  . mirtazapine  15 mg Oral QHS  . multivitamin with minerals  1 tablet Oral Daily  . prazosin  2 mg Oral QHS  . risperiDONE  3 mg Oral BID  . senna-docusate  1 tablet Oral Daily  . tamsulosin  0.4 mg Oral Daily  . traZODone  150 mg Oral QHS   Continuous Infusions:   LOS: 15 days    Time spent: 20 minutes    Sidney Ace, MD [facility] Pager [phone]  [phone]  If 7PM-7AM, please contact night-coverage 04/13/2019, 12:34 PM

## 2019-04-13 NOTE — NC FL2 (Signed)
[place] LEVEL OF CARE SCREENING TOOL     IDENTIFICATION  Patient Name: Peter Parsons Birthdate: 1960-05-31 Sex: male Admission Date (Current Location): 02/11/2019  [place] and [place] Number:  [name] [id number] [place] and Address:  [facility], [address], [place],  [address]      Provider Number: [id number]  Attending Physician Name and Address:  Sidney Ace, MD  Relative Name and Phone Number:  [name] [phone] legal guardian    Current Level of Care: Hospital Recommended Level of Care: Memory Care Prior Approval Number:    Date Approved/Denied:   PASRR Number: [id number]  Discharge Plan: Domiciliary (Rest home)    Current Diagnoses: Patient Active Problem List   Diagnosis Date Noted  . Pressure injury of skin 04/13/2019  . Acute metabolic encephalopathy [id number]  . Protein-calorie malnutrition, severe 03/29/2019  . Weakness 03/27/2019  . Dementia ([place]) 03/19/2019  . Altered mental status   . Paranoid schizophrenia ([place])     Orientation RESPIRATION BLADDER Height & Weight     Self, Time, Situation, Place  Normal Continent Weight: 70.8 kg Height:  5\' 9"  (175.3 cm)  BEHAVIORAL SYMPTOMS/MOOD NEUROLOGICAL BOWEL NUTRITION STATUS  Wanderer   Continent Diet(Soft)  AMBULATORY STATUS COMMUNICATION OF NEEDS Skin   Limited Assist Verbally Normal                       Personal Care Assistance Level of Assistance  Bathing, Dressing Bathing Assistance: Independent   Dressing Assistance: Independent     Functional Limitations Info  Speech     Speech Info: Impaired    SPECIAL CARE FACTORS FREQUENCY  PT (By licensed PT), OT (By licensed OT)     PT Frequency: 5 times per week              Contractures Contractures Info: Not present    Additional Factors Info  Code Status, Allergies Code Status Info: Full Allergies Info: Acetaminophen           Current Medications  (04/13/2019):  This is the current hospital active medication list Current Facility-Administered Medications  Medication Dose Route Frequency Provider Last Rate Last Admin  . atorvastatin (LIPITOR) tablet 10 mg  10 mg Oral Daily Carrie Mew, MD   10 mg at 04/13/19 0813  . chlorhexidine (PERIDEX) 0.12 % solution 15 mL  15 mL Mouth/Throat BID Lavina Hamman, MD   15 mL at 04/12/19 2123  . clonazePAM (KLONOPIN) tablet 0.25 mg  0.25 mg Oral TID PRN Dixie Dials, MD   0.25 mg at 04/02/19 1621  . dextrose 50 % solution 50 mL  1 ampule Intravenous PRN Lavina Hamman, MD      . enoxaparin (LOVENOX) injection 40 mg  40 mg Subcutaneous Q24H Lavina Hamman, MD   40 mg at 04/13/19 0602  . escitalopram (LEXAPRO) tablet 10 mg  10 mg Oral Daily Cristofano, Dorene Ar, MD   10 mg at 04/13/19 0928  . feeding supplement (NEPRO CARB STEADY) liquid 237 mL  237 mL Oral TID BM Enzo Bi, MD   237 mL at 04/13/19 0813  . feeding supplement (PRO-STAT SUGAR FREE 64) liquid 30 mL  30 mL Oral BID WC Enzo Bi, MD   30 mL at 04/12/19 0902  . gabapentin (NEURONTIN) capsule 300 mg  300 mg Oral TID Carrie Mew, MD   300 mg at 04/13/19 0813  . mirtazapine (REMERON) tablet 15 mg  15 mg  Oral Glyn Ade, MD   15 mg at 04/12/19 2123  . multivitamin with minerals tablet 1 tablet  1 tablet Oral Daily Cristofano, Dorene Ar, MD   1 tablet at 04/13/19 0813  . prazosin (MINIPRESS) capsule 2 mg  2 mg Oral QHS Johnn Hai, MD   2 mg at 04/12/19 2123  . risperiDONE (RISPERDAL) tablet 3 mg  3 mg Oral BID Carrie Mew, MD   3 mg at 04/13/19 [id number]  . senna-docusate (Senokot-S) tablet 1 tablet  1 tablet Oral QHS PRN Mansy, Arvella Merles, MD      . senna-docusate (Senokot-S) tablet 1 tablet  1 tablet Oral Daily Enzo Bi, MD   1 tablet at 04/13/19 [phone]  . tamsulosin (FLOMAX) capsule 0.4 mg  0.4 mg Oral Daily Carrie Mew, MD   0.4 mg at 04/13/19 0813  . traZODone (DESYREL) tablet 150 mg  150 mg Oral QHS Carrie Mew, MD   150  mg at 04/12/19 2123     Discharge Medications: Please see discharge summary for a list of discharge medications.  Relevant Imaging Results:  Relevant Lab Results:   Additional Information SSN: [id number]  Shelbie Ammons, RN

## 2019-04-13 NOTE — Plan of Care (Signed)
  Problem: Education: Goal: Knowledge of General Education information will improve Description: Including pain rating scale, medication(s)/side effects and non-pharmacologic comfort measures Outcome: Progressing   Problem: Activity: Goal: Risk for activity intolerance will decrease Outcome: Progressing   Problem: Pain Managment: Goal: General experience of comfort will improve Outcome: Progressing   Problem: Safety: Goal: Ability to remain free from injury will improve Outcome: Progressing   Problem: Skin Integrity: Goal: Risk for impaired skin integrity will decrease Outcome: Progressing   Problem: Education: Goal: Knowledge of disease or condition will improve Outcome: Progressing Goal: Knowledge of secondary prevention will improve Outcome: Progressing Goal: Knowledge of patient specific risk factors addressed and post discharge goals established will improve Outcome: Progressing   Problem: Intracerebral Hemorrhage Tissue Perfusion: Goal: Complications of Intracerebral Hemorrhage will be minimized Outcome: Progressing   Problem: Ischemic Stroke/TIA Tissue Perfusion: Goal: Complications of ischemic stroke/TIA will be minimized Outcome: Progressing

## 2019-04-13 NOTE — TOC Progression Note (Signed)
Transition of Care [facility]) - Progression Note    Patient Details  Name: Peter Parsons MRN: [id number] Date of Birth: 04/10/1960  Transition of Care [facility]) CM/SW Contact  Shelbie Ammons, RN Phone Number: 04/13/2019, 12:07 PM  Clinical Narrative:    RNCM contacted [name] from [facility] who reported that they are still evaluating patient but they have quite a few that they are looking at right now. RNCM placed call to legal guardian Peter Parsons to discuss case and issues related to placement. She offered a few suggestions of places they have recently had luck with placement at. Faxed her copy of FL2 to [phone] and she reported that she would forward to some places as well.  RNCM contacted Sharyn Lull with [facility] in [place] who reports they will take a look at patient. She requested a bedside assessment and this was approved by Jonelle Sidle, [place]. Clinicals faxed for review to [phone].      Expected Discharge Plan: Group Home Barriers to Discharge: SNF Pending bed offer  Expected Discharge Plan and Services Expected Discharge Plan: Group Home   Discharge Planning Services: CM Consult   Living arrangements for the past 2 months: Group Home                 DME Arranged: N/A DME Agency: [employer] Date DME Agency Contacted: 04/12/19 Time DME Agency Contacted: [phone] Representative spoke with at DME Agency: none needed [place] Arranged: NA           Social Determinants of Health (SDOH) Interventions    Readmission Risk Interventions No flowsheet data found.

## 2019-04-13 NOTE — TOC Progression Note (Signed)
Transition of Care [facility]) - Progression Note    Patient Details  Name: Peter Parsons MRN: [id number] Date of Birth: 1960/02/20  Transition of Care [facility]) CM/SW Contact  Su Hilt, RN Phone Number: 04/13/2019, 1:35 PM  Clinical Narrative:    Sharyn Lull from [facility] called and stated that they are a skilled facility and they would not be able to offer a bed as the patient is walking very well and does not meet the criteria for SNF, will continue to search for a locked memory unit for the patient   Expected Discharge Plan: Group Home Barriers to Discharge: SNF Pending bed offer  Expected Discharge Plan and Services Expected Discharge Plan: Group Home   Discharge Planning Services: CM Consult   Living arrangements for the past 2 months: Group Home                 DME Arranged: N/A DME Agency: [facility] Date DME Agency Contacted: 04/12/19 Time DME Agency Contacted: [phone] Representative spoke with at DME Agency: none needed [place] Arranged: NA           Social Determinants of Health (SDOH) Interventions    Readmission Risk Interventions No flowsheet data found.

## 2019-04-13 NOTE — TOC Progression Note (Signed)
Transition of Care [facility]) - Progression Note    Patient Details  Name: Grier Souder MRN: [id number] Date of Birth: [birth year]  Transition of Care [facility]) CM/SW Contact  Shelbie Ammons, RN Phone Number: 04/13/2019, 1:26 PM  Clinical Narrative:   RNCM contacted and faxed clinicals to [facility] at [phone], [facility] in [place] at [phone] and [facility] in [place] at [phone].     Expected Discharge Plan: Group Home Barriers to Discharge: SNF Pending bed offer  Expected Discharge Plan and Services Expected Discharge Plan: Group Home   Discharge Planning Services: CM Consult   Living arrangements for the past 2 months: Group Home                 DME Arranged: N/A DME Agency: [employer] Date DME Agency Contacted: 04/12/19 Time DME Agency Contacted: [phone] Representative spoke with at DME Agency: none needed [place] Arranged: NA           Social Determinants of Health (SDOH) Interventions    Readmission Risk Interventions No flowsheet data found.

## 2019-04-14 DIAGNOSIS — F0390 Unspecified dementia without behavioral disturbance: Secondary | ICD-10-CM | POA: Diagnosis not present

## 2019-04-14 DIAGNOSIS — E43 Unspecified severe protein-calorie malnutrition: Secondary | ICD-10-CM | POA: Diagnosis not present

## 2019-04-14 DIAGNOSIS — F200 Paranoid schizophrenia: Secondary | ICD-10-CM | POA: Diagnosis not present

## 2019-04-14 LAB — GLUCOSE, CAPILLARY
Glucose-Capillary: 98 mg/dL (ref 70–99)
Glucose-Capillary: 130 mg/dL — ABNORMAL HIGH (ref 70–99)
Glucose-Capillary: 94 mg/dL (ref 70–99)
Glucose-Capillary: 103 mg/dL — ABNORMAL HIGH (ref 70–99)

## 2019-04-14 LAB — CREATININE, SERUM
Creatinine, Ser: 1.15 mg/dL (ref 0.61–1.24)
GFR calc Af Amer: >60 mL/min (ref >60)
GFR calc non Af Amer: >60 mL/min (ref >60)

## 2019-04-14 LAB — CBC
HCT: 30.3 % — ABNORMAL LOW (ref 39.0–52.0)
Hemoglobin: 10.7 g/dL — ABNORMAL LOW (ref 13.0–17.0)
MCH: 30.7 pg (ref 26.0–34.0)
MCHC: 35.3 g/dL (ref 30.0–36.0)
MCV: 86.8 fL (ref 80.0–100.0)
Platelets: 180 10*3/uL (ref 150–400)
RBC: 3.49 MIL/uL — ABNORMAL LOW (ref 4.22–5.81)
RDW: 13.5 % (ref 11.5–15.5)
WBC: 4.9 10*3/uL (ref 4.0–10.5)
nRBC: 0 % (ref 0.0–0.2)

## 2019-04-14 NOTE — TOC Progression Note (Signed)
Transition of Care [facility]) - Progression Note    Patient Details  Name: Burford Wheatley MRN: [id number] Date of Birth: 1960/10/25  Transition of Care [facility]) CM/SW Contact  Shelbie Ammons, RN Phone Number: 04/14/2019, 2:50 PM  Clinical Narrative:   RNCM followed up with [name] today to make her aware that the facilities she had recommended yesterday were unable to accept patient. [name] also recommended this CM follow up with patient's care co-ordinator with [facility] [phone]. Placed call to [place] to discuss case. She reports that she has been following along and has been trying to offer suggestions for placement when she comes across them however given that patient now has a diagnosis of Dementia and is needing a memory care unit he is not really appropriate for services through [facility]. She did however offer contact information for [facility] in [place] at [phone]. RNCM placed call to this agency and left VM however when they called back they reported they are actually a physician's office and work on the outpatient basis.     Expected Discharge Plan: Group Home Barriers to Discharge: SNF Pending bed offer  Expected Discharge Plan and Services Expected Discharge Plan: Group Home   Discharge Planning Services: CM Consult   Living arrangements for the past 2 months: Group Home                 DME Arranged: N/A DME Agency: [facility] Date DME Agency Contacted: 04/12/19 Time DME Agency Contacted: [phone] Representative spoke with at DME Agency: none needed [place] Arranged: NA           Social Determinants of Health (SDOH) Interventions    Readmission Risk Interventions No flowsheet data found.

## 2019-04-14 NOTE — TOC Progression Note (Signed)
Transition of Care [facility]) - Progression Note    Patient Details  Name: Cloid Vossler MRN: [id number] Date of Birth: 1960/05/22  Transition of Care [facility]) CM/SW Contact  Shelbie Ammons, RN Phone Number: 04/14/2019, 10:32 AM  Clinical Narrative:   RNCM placed call in follow up to both [facility] and [facility] both say they are unable to offer a bed saying that his care needs exceed what they can provide. Placed call to [facility] and they said they don't believe they got the fax. Verified fax number and re-faxed information to [phone].     Expected Discharge Plan: Group Home Barriers to Discharge: SNF Pending bed offer  Expected Discharge Plan and Services Expected Discharge Plan: Group Home   Discharge Planning Services: CM Consult   Living arrangements for the past 2 months: Group Home                 DME Arranged: N/A DME Agency: [facility] Date DME Agency Contacted: 04/12/19 Time DME Agency Contacted: [phone] Representative spoke with at DME Agency: none needed [place] Arranged: NA           Social Determinants of Health (SDOH) Interventions    Readmission Risk Interventions No flowsheet data found.

## 2019-04-14 NOTE — Evaluation (Addendum)
Occupational Therapy Treatment Patient Details Name: Peter Parsons MRN: [id number] DOB: 1960-04-09 Today's Date: 04/14/2019    History of Present Illness Pt is a 59 y.o. male initially presenting to hospital 02/11/20 with IVC from group home d/t wandering off into woods multiple times (found near water); pt reporting suicidal thoughts of drowning himself.  Pt tripped on his own feet and hit chin on chair on morning of 2/4 (pt got back up and walked to restroom independently).  Unable to return to group home d/t safety concerns.  Per chart review pt scored 19/30 on [facility] cognitive assessment with psychiatry indicative of moderate cognitive impairment (consistent with pt's diagnosis of dementia).  Pt was in [place] in ED for about 44 days prior to being admitted to hospital 2/13 for generalized weakness, dysphagia, and slurred speech.  MRI negative for acute CVA.  PMH includes DM, htn, schizophrenia.   Clinical Impression   Pt continues to present with flat affect and poor initiation in today's session.  He is fixated on certain thoughts/delusions, including "finding the right number" and "getting back to his mind".  OTR provided max verbal cues for pt to initiate functional transfers, mobility, and grooming.  Pt has physical capacity to complete ADL, but has poor initiation and motivation to do so.  OTR provided min A for supine > sit and sit > stand with RW.  Min A required for balance standing at sink, with one LOB (OTR able to correct).  Pt's posterior lean appears improved compared to previous sessions.  Pt completed washing face, hands, combing hair, and applying deodorant with max verbal and tactile cues to initiate.  HHOT with 24 hour supervision remains most appropriate discharge recommendation at this time.    Follow Up Recommendations  Home health OT;Supervision/Assistance - 24 hour    Equipment Recommendations  3 in 1 bedside commode;Other (comment)    Recommendations for Other Services        Precautions / Restrictions Precautions Precautions: Fall Precaution Comments: Suicide precautions; aspiration precautions Restrictions Weight Bearing Restrictions: No      Mobility Bed Mobility Overal bed mobility: Needs Assistance Bed Mobility: Sit to Supine     Supine to sit: Min assist;HOB elevated        Transfers Overall transfer level: Needs assistance Equipment used: Rolling walker (2 wheeled) Transfers: Sit to/from Stand Sit to Stand: Min assist Stand pivot transfers: Min guard       General transfer comment: Min A for balance in transfers and functional ambulation    Balance Overall balance assessment: Needs assistance Sitting-balance support: No upper extremity supported;Feet supported Sitting balance-Leahy Scale: Good   Postural control: Posterior lean Standing balance support: Bilateral upper extremity supported Standing balance-Leahy Scale: Fair Standing balance comment: Posterior lean in standing.  Requires tactile/verbal cues and MIN A to correct.  One LOB standing at sink, OTR able to correct                           ADL either performed or assessed with clinical judgement   ADL Overall ADL's : Needs assistance/impaired     Grooming: Wash/dry hands;Wash/dry face;Applying deodorant;Brushing hair;Min guard;Set up;Standing Grooming Details (indicate cue type and reason): Completed grooming standing at sink, with UE support from walker or counter.  OTR provided min guard for balance and max verbal cues for initiation of grooming tasks.  Functional mobility during ADLs: Minimal assistance;Rolling walker General ADL Comments: Pt requires max verbal and tactile cues for initiation of ADLs and functional transfers.  Pt requires min assist for balance while standing and ambulating.  Posterior lean appears improved compared to previous sessions.  Pt with shuffling gait and one LOB while standing at  sink.  OTR able to correct.     Vision Patient Visual Report: No change from baseline       Perception     Praxis      Pertinent Vitals/Pain Pain Assessment: No/denies pain     Hand Dominance     Extremity/Trunk Assessment Upper Extremity Assessment Upper Extremity Assessment: Generalized weakness   Lower Extremity Assessment Lower Extremity Assessment: Generalized weakness   Cervical / Trunk Assessment Cervical / Trunk Assessment: Normal   Communication Communication Communication: (increased time to vocalize preferences/needs)   Cognition Arousal/Alertness: Awake/alert Behavior During Therapy: Flat affect Overall Cognitive Status: Within Functional Limits for tasks assessed                                 General Comments: Pt required significant time for processing, able to complete functional tasks with increased time/verbal cues.  Pt fixated on "finding the right number" and "getting back to his mind".   General Comments       Exercises Other Exercises Other Exercises: provided education re: safety and fall precautions, self care, participation in meaningful occupations, functional transfers and mobility   Shoulder Instructions      Home Living Family/patient expects to be discharged to:: Group home Living Arrangements: Parent Available Help at Discharge: Family;Available PRN/intermittently                                    Prior Functioning/Environment Level of Independence: Independent        Comments: Ambulatory no AD        OT Problem List: Decreased strength;Decreased activity tolerance;Impaired balance (sitting and/or standing);Decreased safety awareness;Decreased knowledge of use of DME or AE;Decreased knowledge of precautions;Decreased cognition      OT Treatment/Interventions: Self-care/ADL training;Therapeutic exercise;DME and/or AE instruction;Therapeutic activities;Patient/family education;Balance training     OT Goals(Current goals can be found in the care plan section) Acute Rehab OT Goals Patient Stated Goal: to improve strength/balance OT Goal Formulation: With patient Time For Goal Achievement: 04/28/19 Potential to Achieve Goals: Good  OT Frequency: Min 1X/week   Barriers to D/C:            Co-evaluation              AM-PAC OT "6 Clicks" Daily Activity     Outcome Measure Help from another person eating meals?: None Help from another person taking care of personal grooming?: A Lot(requires mod-max verbal cues for initiation) Help from another person toileting, which includes using toliet, bedpan, or urinal?: A Little Help from another person bathing (including washing, rinsing, drying)?: A Lot Help from another person to put on and taking off regular upper body clothing?: A Little Help from another person to put on and taking off regular lower body clothing?: A Little 6 Click Score: 17   End of Session Equipment Utilized During Treatment: Rolling walker;Gait belt  Activity Tolerance: Patient tolerated treatment well;No increased pain Patient left: with call bell/phone within reach;in chair;with chair alarm set;Other (comment)(PT in room at end of session)  OT Visit Diagnosis: Unsteadiness on feet (R26.81);Muscle weakness (generalized) (M62.81)                Time: [id number] OT Time Calculation (min): 45 min Charges:  OT General Charges $OT Visit: 1 Visit OT Treatments $Self Care/Home Management : 23-37 mins $Therapeutic Activity: 8-22 mins  Myrtie Hawk, OTR/L 04/14/19, 11:23 AM

## 2019-04-14 NOTE — Progress Notes (Signed)
PROGRESS NOTE    Peter Parsons  [id number] DOB: April 29, 1960 DOA: 02/11/2019 PCP: System, Provider Not In   Brief Narrative: Initially presented on 02/11/2019 from group home as he has suicidal ideation and was wandering off in the woods.  Prior to this patient had multiple ER visits with injuries after he ran out of his group home.  Patient was admitted at behavioral health for suicidal ideation in November 2020. Initial psychiatric evaluation recommended inpatient psychiatric admission at [facility]. On 02/18/2019 psychiatric recommended patient is cleared from psychiatric point of view and now patient requires a locked memory care unit. While awaiting for placement in ER, EDP noted increasing confusion, generalized weakness and dysphagia and patient was referred for further work-up for admission. MRI brain negative for any acute stroke. Speech evaluation recommends dysphagia diet. Discussed with psychiatry IVC can be rescinded so it was performed on 04/07/2019.  Although the patient does not have medical decision-making capacity therefore cannot leave AMA.  2/26: Patient seen and examined.  No changes noted over interval.  Pending arrangement of memory care unit.  Patient does not have medical decision-making capacity and cannot leave AGAINST MEDICAL ADVICE  2/27: Patient seen and examined.  No acute medical changes over interval.  Patient seems a bit lethargic this morning.  Possibly medication effect.  Patient reevaluated by psychiatry yesterday.  IVC rescinded however patient will still require placement.  Case management aware and actively looking for placement options.  2/28: Patient seen and examined.  More alert awake this morning.  Still states he is confused however when prompted he is alert and oriented x3.  We continue to await placement.  3/1: Patient seen and examined.  Lethargic this morning but easily awakened.  Continues to endorse confusion.  Awaiting  placement.  3/2: Patient seen and examined.  Lethargic this morning.  Easily awakened.  Awaiting placement  3/3: Patient without any complaints today.  He was quite sleepy but arousable by voice alone.    Assessment & Plan:   Principal Problem:   Paranoid schizophrenia ([place]) Active Problems:   Altered mental status   Dementia ([place])   Weakness   Acute metabolic encephalopathy   Protein-calorie malnutrition, severe   Pressure injury of skin  1.  Dysphagia, dysarthria, left facial droop CVA ruled out with MRI. Currently still has some dysphagia, pockets foods, but takes in fluids ok.    2.  Depression, anxiety, bipolar disorder and schizophrenia. Frequent suicidal ideation. Initially IVC. Patient definitely does not have any capacity to make medical decisions, and has a guardian. Patient was from a group home. --Continue Lexapro, Risperdal, trazodone --Continue prazosin --Hold Klonopin due to it causing extreme somnolence  Case management looking for memory care unit.  Patient cannot leave AMA.  3.  Type 2 diabetes mellitus uncontrolled with chronic kidney disease stage IIIa as well as peripheral neuropathy. Hold Metformin. Monitor renal function. Avoid nephrotoxic medications. --d/c fingersticks as pt has not needed much SSI.  4.  BPH Continue Flomax  5.  Dyslipidemia Continue statin  6.  Essential hypertension Patient was on lisinopril for renal protective effect but we discontinued that given his potassium is mildly elevated as well as renal function is elevated. Monitor blood pressure for now.  7.  B12 deficiency.  Folate deficiency --replaced with subcu B12 and oral replacement.  8. Severe Malnutritionrelated to social / environmental circumstances(inadequate oral intake)as evidenced by 21.6%weight loss over the past 2-3 months, moderate fat depletion, moderate-severe muscle depletion. --continue Remeron to attempt  to enhance appetite -Calorie count  shows that the patient is able to tolerate 100% of his require calorie due to increased intake of the nutritional supplementation.  Hoping for improvement down the road with the medicines.  DVT prophylaxis: Lovenox Code Status: Full Family Communication: None today, patient with legal guardian Disposition Plan: Unknown at this time.  Case management looking for memory care unit.  Consultants:   Psychiatry  Procedures:  None  Antimicrobials:  None   Subjective: Patient seen and examined No acute interval status changes No new complaints  Objective: Vitals:   04/13/19 2344 04/14/19 0838 04/14/19 1443 04/14/19 1538  BP: 104/68 118/76 111/74 127/74  Pulse: 74 83 90 88  Resp: 16 16 16 16   Temp: 99.6 F (37.6 C) 97.9 F (36.6 C) 99.3 F (37.4 C) 98.7 F (37.1 C)  TempSrc: Oral Oral Oral Oral  SpO2: 97% 97% 98% 99%  Weight:      Height:        Intake/Output Summary (Last 24 hours) at 04/14/2019 1801 Last data filed at 04/14/2019 1359 Gross per 24 hour  Intake 200 ml  Output 200 ml  Net 0 ml   Filed Weights   02/11/19 1441 03/28/19 0306  Weight: 77.1 kg 70.8 kg    Examination:  General exam: Appears calm and comfortable  Respiratory system: Clear to auscultation. Respiratory effort normal. Cardiovascular system: S1 & S2 heard, RRR. No JVD, murmurs, rubs, gallops or clicks. No pedal edema. Gastrointestinal system: Abdomen is nondistended, soft and nontender. No organomegaly or masses felt. Normal bowel sounds heard. Central nervous system: Oriented x2.  No focal deficits Extremities: Symmetric 5 x 5 power. Skin: No rashes, lesions or ulcers Psychiatry: Flattened affect    Data Reviewed: I have personally reviewed following labs and imaging studies  CBC: Recent Labs  Lab 04/14/19 0414  WBC 4.9  HGB 10.7*  HCT 30.3*  MCV 86.8  PLT [id number]   Basic Metabolic Panel: Recent Labs  Lab 04/14/19 0414  CREATININE 1.15   GFR: Estimated Creatinine Clearance:  68.3 mL/min (by C-G formula based on SCr of 1.15 mg/dL). Liver Function Tests: No results for input(s): AST, ALT, ALKPHOS, BILITOT, PROT, ALBUMIN in the last 168 hours. No results for input(s): LIPASE, AMYLASE in the last 168 hours. No results for input(s): AMMONIA in the last 168 hours. Coagulation Profile: No results for input(s): INR, PROTIME in the last 168 hours. Cardiac Enzymes: No results for input(s): CKTOTAL, CKMB, CKMBINDEX, TROPONINI in the last 168 hours. BNP (last 3 results) No results for input(s): PROBNP in the last 8760 hours. HbA1C: No results for input(s): HGBA1C in the last 72 hours. CBG: Recent Labs  Lab 04/14/19 0849 04/14/19 1151 04/14/19 1627  GLUCAP 98 130* 94   Lipid Profile: No results for input(s): CHOL, HDL, LDLCALC, TRIG, CHOLHDL, LDLDIRECT in the last 72 hours. Thyroid Function Tests: No results for input(s): TSH, T4TOTAL, FREET4, T3FREE, THYROIDAB in the last 72 hours. Anemia Panel: No results for input(s): VITAMINB12, FOLATE, FERRITIN, TIBC, IRON, RETICCTPCT in the last 72 hours. Sepsis Labs: No results for input(s): PROCALCITON, LATICACIDVEN in the last 168 hours.  No results found for this or any previous visit (from the past 240 hour(s)).       Radiology Studies: No results found.      Scheduled Meds: . atorvastatin  10 mg Oral Daily  . chlorhexidine  15 mL Mouth/Throat BID  . enoxaparin (LOVENOX) injection  40 mg Subcutaneous Q24H  . escitalopram  10  mg Oral Daily  . feeding supplement (NEPRO CARB STEADY)  237 mL Oral TID BM  . feeding supplement (PRO-STAT SUGAR FREE 64)  30 mL Oral BID WC  . gabapentin  300 mg Oral TID  . mirtazapine  15 mg Oral QHS  . multivitamin with minerals  1 tablet Oral Daily  . prazosin  2 mg Oral QHS  . risperiDONE  3 mg Oral BID  . senna-docusate  1 tablet Oral Daily  . tamsulosin  0.4 mg Oral Daily  . traZODone  150 mg Oral QHS   Continuous Infusions:   LOS: 16 days    Time spent: 20  minutes    Vashti Hey, MD [facility] Pager [phone]  If 7PM-7AM, please contact night-coverage 04/14/2019, 6:01 PM

## 2019-04-14 NOTE — Progress Notes (Signed)
Speech Language Pathology Treatment: Dysphagia  Patient Details Name: Peter Parsons MRN: [id number] DOB: [birth year] Today's Date: 04/14/2019 Time: 1601-0932 SLP Time Calculation (min) (ACUTE ONLY): 35 min  Assessment / Plan / Recommendation Clinical Impression  Pt's chart reviewed. Pt still awaits placement w/ GeriPsych needs in mind per chart notes. Pt continues to present w/ flat affect and minimal engagement; cues needed for follow through w/ all tasks -- suspect impact from his psychiatric baseline. Pt has been tolerating his currently recommended diet per Speech w/ no overt s/s of aspiration noted in chart; pt is feeding self w/ setup but needs support from [facility] staff d/t Cognitive decline.  Pt seen for ongoing assessment of swallowing; toleration of oral diet and safety w/ oral intake. Psychiatry is following. Pt continues to haveconfusion due to his schizophreniaper MD notes; oral phase deficits (w/ solid foods/diet) he had previously have Not been noted by [facility] staff(no pocketing, oral holding reported) w/ the Puree diet consistency.  Pt sitting in chair for this session. Oral care completed(to check for any oral pocketing also). Tray setup support necessary as pt had not opened some of the meal items. Pt fed self consuming trials of thin liquids via Cup and boluses of Purees on the trayw/ No overt clinical s/s of aspiration; no decline in vocal quality or respiratory status noted during/post trials. Oral phase bolus management and A-P transfer of the Puree foods was grossly adequate; min slower as pt hesitated one minute but then noted pt Impulsively fed himself multiple bites in a row(he swallowed and cleared each bite). Educated and instructed pt on general aspiration precautions to include smaller sips, slowly and totake his time b/t bites -- no multiple spoonfuls in a row. Also to use tongue/finger to sweep around his mouth to fully clear it of any foods during eating/meals. Practiced this  w/ pt though unsure of carryover of information.  Pt is easily distracted and Min+ impulsive w/ self-drinking and feeding -- requires redirection and monitoring during oral intake for follow through and safety. Suspect oral phase deficits are directly related to his Baseline dxs of Cognitive status decline of Dementia and the dx of Schizophrenia, as per chart notes. Recommendcontinue a modifieddietd/t Mental/Cognitive decline -- Dysphagia level 1(pureed)w/ Thin liquids; general aspiration precautions monitoring straw use; feeding support at meals w/ cues redirect attention to oral clearing and to feed self less impulsively during eating/drinking. Pills in Puree - Crushed for safer swallowing d/t Cognitive status, illness. Education provided this session; precautions posted in room. Suspect this diet consistency may be beneficial at Discharge to lessen risk of Choking and to help pt meet nutritional needs adequately, safely. Recommend Dietician f/u for support also.A diet upgrade in food consistency could be considered at next venue of care when pt's Cognitive/mental status indicates safety w/ such upgrade in consistency. No further skilled ST services indicated at this time in this Acute setting.      HPI HPI: Pt is 59 y/o M with PMH including: T2DM, HTN, Dementia per chart, and paranoid schizophrenia who had been on the B[facility] 44 days prior to this encounter. Pt was admitted to the [facility] from his [place] after wandering into the woods near water. There were reports of suicidal ideations. Pt presents d/t the Acute floor d/t generalized weakness, suspicion of dysphagia, and slurred speech. There is concern for Medicaion effect per MD report. MRI negative for acute CVA.  Per chart, dx of Protein-calorie malnutrition, Severe.       SLP Plan  All goals met       Recommendations  Diet recommendations: Dysphagia 1 (puree);Thin liquid Liquids provided via: Cup;Straw(monitor) Medication Administration:  Crushed with puree(as able for safer, easier swallowing) Supervision: Patient able to self feed;Staff to assist with self feeding;Intermittent supervision to cue for compensatory strategies Compensations: Minimize environmental distractions;Slow rate;Small sips/bites;Lingual sweep for clearance of pocketing;Multiple dry swallows after each bite/sip;Follow solids with liquid Postural Changes and/or Swallow Maneuvers: Seated upright 90 degrees;Upright 30-60 min after meal;Out of bed for meals                General recommendations: (Dietician f/u) Oral Care Recommendations: Oral care BID;Oral care before and after PO;Staff/trained caregiver to provide oral care Follow up Recommendations: None(until mental status is appropriate for safe diet upgrade) SLP Visit Diagnosis: Dysphagia, oral phase (R13.11)(impact from mental/Cognitive status, decline) Plan: All goals met       GO                 Orinda Kenner, MS, CCC-SLP Lavelle Akel 04/14/2019, 1:52 PM

## 2019-04-14 NOTE — Progress Notes (Signed)
Physical Therapy Treatment Patient Details Name: Peter Parsons MRN: [id number] DOB: [birth year] Today's Date: 04/14/2019    History of Present Illness Pt is a 59 y.o. male initially presenting to hospital 02/11/20 with IVC from group home d/t wandering off into woods multiple times (found near water); pt reporting suicidal thoughts of drowning himself.  Pt tripped on his own feet and hit chin on chair on morning of 2/4 (pt got back up and walked to restroom independently).  Unable to return to group home d/t safety concerns.  Per chart review pt scored 19/30 on [facility] cognitive assessment with psychiatry indicative of moderate cognitive impairment (consistent with pt's diagnosis of dementia).  Pt was in [place] in ED for about 44 days prior to being admitted to hospital 2/13 for generalized weakness, dysphagia, and slurred speech.  MRI negative for acute CVA.  PMH includes DM, htn, schizophrenia.    PT Comments    Pt sitting in recliner upon PT arrival.  Pt steady and strong with sit to/from stand transfers today (no posterior lean noted with standing).  Able to ambulate around nursing loop 1x no AD and 1x with RW.  Pt unsteady ambulating without AD (CGA to min assist x2 for balance/safety) but steady ambulating with RW (CGA x2 provided but not required).  HR increased to 121 bpm with activity but decreased to 97-104 bpm fairly quickly with rest.  Will continue to focus on balance, strengthening, and progressive functional mobility during hospitalization.   Follow Up Recommendations  Home health PT;Supervision/Assistance - 24 hour     Equipment Recommendations  Rolling walker with 5" wheels;3in1 (PT)    Recommendations for Other Services       Precautions / Restrictions Precautions Precautions: Fall Precaution Comments: Suicide precautions; aspiration precautions Restrictions Weight Bearing Restrictions: No    Mobility  Bed Mobility               General bed mobility comments:  Deferred (pt in recliner beginning and end of session)  Transfers Overall transfer level: Needs assistance Equipment used: None Transfers: Sit to/from Stand Sit to Stand: Min guard         General transfer comment: sit to/from stand x5 reps no AD with additional 2 trials during session for ambulation (from recliner); steady and strong transfers noted  Ambulation/Gait Ambulation/Gait assistance: Min guard;Min assist;+2 physical assistance;+2 safety/equipment Gait Distance (Feet): (200 feet with no AD; 200 feet with RW) Assistive device: None;Rolling walker (2 wheeled)   Gait velocity: decreased   General Gait Details: mildly unsteady ambulating without AD (CGA to min assist x2 for balance and safety); CGA x2 ambulating with RW; partial step through gait pattern with narrow BOS and decreased B LE step length/foot clearance (vc's given to increase BOS and take longer steps)   Stairs             Wheelchair Mobility    Modified Rankin (Stroke Patients Only)       Balance Overall balance assessment: Needs assistance Sitting-balance support: No upper extremity supported;Feet supported Sitting balance-Leahy Scale: Good Sitting balance - Comments: steady sitting reaching within BOS   Standing balance support: No upper extremity supported Standing balance-Leahy Scale: Good Standing balance comment: steady standing reaching within BOS                            Cognition Arousal/Alertness: Awake/alert Behavior During Therapy: Flat affect Overall Cognitive Status: Within Functional Limits for tasks assessed  General Comments: Increased time for processing and to respond to questions or initiate movement for activities      Exercises      General Comments   Nursing cleared pt for participation in physical therapy.  Pt agreeable to PT session (just finished with OT session).      Pertinent Vitals/Pain Pain  Assessment: Faces Faces Pain Scale: No hurt    Home Living                      Prior Function            PT Goals (current goals can now be found in the care plan section) Acute Rehab PT Goals Patient Stated Goal: to improve strength/balance PT Goal Formulation: With patient Time For Goal Achievement: 04/20/19 Potential to Achieve Goals: Good Progress towards PT goals: Progressing toward goals    Frequency    Min 2X/week      PT Plan Current plan remains appropriate    Co-evaluation              AM-PAC PT "6 Clicks" Mobility   Outcome Measure  Help needed turning from your back to your side while in a flat bed without using bedrails?: A Little Help needed moving from lying on your back to sitting on the side of a flat bed without using bedrails?: A Little Help needed moving to and from a bed to a chair (including a wheelchair)?: A Little Help needed standing up from a chair using your arms (e.g., wheelchair or bedside chair)?: A Little Help needed to walk in hospital room?: A Little Help needed climbing 3-5 steps with a railing? : A Little 6 Click Score: 18    End of Session Equipment Utilized During Treatment: Gait belt Activity Tolerance: Patient tolerated treatment well Patient left: in chair;with call bell/phone within reach;with chair alarm set;with nursing/sitter in room Nurse Communication: Mobility status;Precautions PT Visit Diagnosis: Other abnormalities of gait and mobility (R26.89);Muscle weakness (generalized) (M62.81);History of falling (Z91.81);Difficulty in walking, not elsewhere classified (R26.2)     Time: [id number] PT Time Calculation (min) (ACUTE ONLY): 30 min  Charges:  $Gait Training: 8-22 mins $Therapeutic Activity: 8-22 mins                     Leitha Bleak, PT 04/14/19, 4:12 PM

## 2019-04-14 NOTE — Progress Notes (Signed)
D: Pt alert and oriented x 4. Pt mood/affect appears as depressed/flat. Pt denies experiencing any pain, SI/HI, or AVH this morning, however this afternoon pt reports experiencing SI and AH. Pt reports he wants to be shot in the head. Pt does not state what the voices are saying just states they he is hearing them and that I'm nice, however nothing further.  A: Scheduled medications administered to pt, per MD orders. Support and encouragement provided. Frequent verbal contact made.   R: No adverse drug reactions noted. Pt verbally contracts for safety this morning and at this time. Pt complaint with medications and treatment plan. Pt interacts well with staff on the unit. Pt remains safe at this time. Will continue to monitor and provide care for as ordered.  Physician notified to pt's current mental status.

## 2019-04-14 NOTE — Plan of Care (Signed)
  Problem: Education: Goal: Knowledge of General Education information will improve Description: Including pain rating scale, medication(s)/side effects and non-pharmacologic comfort measures Outcome: Progressing   Problem: Activity: Goal: Risk for activity intolerance will decrease Outcome: Progressing   Problem: Pain Managment: Goal: General experience of comfort will improve Outcome: Progressing   Problem: Safety: Goal: Ability to remain free from injury will improve Outcome: Progressing   Problem: Skin Integrity: Goal: Risk for impaired skin integrity will decrease Outcome: Progressing   Problem: Education: Goal: Knowledge of disease or condition will improve Outcome: Progressing Goal: Knowledge of secondary prevention will improve Outcome: Progressing Goal: Knowledge of patient specific risk factors addressed and post discharge goals established will improve Outcome: Progressing   Problem: Intracerebral Hemorrhage Tissue Perfusion: Goal: Complications of Intracerebral Hemorrhage will be minimized Outcome: Progressing   Problem: Ischemic Stroke/TIA Tissue Perfusion: Goal: Complications of ischemic stroke/TIA will be minimized Outcome: Progressing

## 2019-04-15 DIAGNOSIS — F200 Paranoid schizophrenia: Secondary | ICD-10-CM | POA: Diagnosis not present

## 2019-04-15 LAB — GLUCOSE, CAPILLARY
Glucose-Capillary: 100 mg/dL — ABNORMAL HIGH (ref 70–99)
Glucose-Capillary: 94 mg/dL (ref 70–99)
Glucose-Capillary: 125 mg/dL — ABNORMAL HIGH (ref 70–99)
Glucose-Capillary: 124 mg/dL — ABNORMAL HIGH (ref 70–99)

## 2019-04-15 NOTE — Plan of Care (Signed)
  Problem: Education: Goal: Knowledge of General Education information will improve Description: Including pain rating scale, medication(s)/side effects and non-pharmacologic comfort measures Outcome: Progressing   Problem: Activity: Goal: Risk for activity intolerance will decrease Outcome: Progressing   Problem: Nutrition: Goal: Adequate nutrition will be maintained Outcome: Progressing   Problem: Pain Managment: Goal: General experience of comfort will improve Outcome: Progressing   Problem: Safety: Goal: Ability to remain free from injury will improve Outcome: Progressing   Problem: Skin Integrity: Goal: Risk for impaired skin integrity will decrease Outcome: Progressing   Problem: Education: Goal: Knowledge of disease or condition will improve Outcome: Progressing Goal: Knowledge of secondary prevention will improve Outcome: Progressing Goal: Knowledge of patient specific risk factors addressed and post discharge goals established will improve Outcome: Progressing   Problem: Nutrition: Goal: Risk of aspiration will decrease Outcome: Progressing   Problem: Intracerebral Hemorrhage Tissue Perfusion: Goal: Complications of Intracerebral Hemorrhage will be minimized Outcome: Progressing   Problem: Ischemic Stroke/TIA Tissue Perfusion: Goal: Complications of ischemic stroke/TIA will be minimized Outcome: Progressing

## 2019-04-15 NOTE — TOC Progression Note (Signed)
Transition of Care [facility]) - Progression Note    Patient Details  Name: Peter Parsons MRN: [id number] Date of Birth: 04-Mar-1960  Transition [facility] Care [facility]) CM/SW Contact  Shelbie Ammons, RN Phone Number: 04/15/2019, 9:02 AM  Clinical Narrative:   RNCM contacted Larene Beach with [facility] and faxed over referral, she reports she will review the referral but at this time they don't have any beds.     Expected Discharge Plan: Group Home Barriers to Discharge: SNF Pending bed offer  Expected Discharge Plan and Services Expected Discharge Plan: Group Home   Discharge Planning Services: CM Consult   Living arrangements for the past 2 months: Group Home                 DME Arranged: N/A DME Agency: [employer] Date DME Agency Contacted: 04/12/19 Time DME Agency Contacted: [phone] Representative spoke with at DME Agency: none needed [place] Arranged: NA           Social Determinants of Health (SDOH) Interventions    Readmission Risk Interventions No flowsheet data found.

## 2019-04-15 NOTE — TOC Progression Note (Signed)
Transition of Care [facility]) - Progression Note    Patient Details  Name: Peter Parsons MRN: [id number] Date of Birth: May 06, 1960  Transition of Care [facility]) CM/SW Contact  Shelbie Ammons, RN Phone Number: 04/15/2019, 1:44 PM  Clinical Narrative:  RNCM left voice mails at [employer], [place], [place] and [place] at [place] and [place] for a call back.  RNCM spoke with Claiborne Billings with [facility] and [facility] and faxed clinicals to her at [phone]. RNCM spoke with Joseph Art at [facility] and faxed clinicals to her at [phone].  Spoke with Elvis Coil at [place] but her facility is not locked.  Spoke with Anderson Malta at [facility] in [place] however she reported none of the [place] facilities currently have any Medicaid beds in their memory care units.     Expected Discharge Plan: Group Home Barriers to Discharge: SNF Pending bed offer  Expected Discharge Plan and Services Expected Discharge Plan: Group Home   Discharge Planning Services: CM Consult   Living arrangements for the past 2 months: Group Home                 DME Arranged: N/A DME Agency: [employer] Date DME Agency Contacted: 04/12/19 Time DME Agency Contacted: [phone] Representative spoke with at DME Agency: none needed [place] Arranged: NA           Social Determinants of Health (SDOH) Interventions    Readmission Risk Interventions No flowsheet data found.

## 2019-04-15 NOTE — Progress Notes (Signed)
PROGRESS NOTE    Peter Parsons  [id number]  DOB: 28-Mar-1960  DOA: 02/11/2019 PCP: System, Provider Not In Outpatient Specialists:   Hospital course:  Initially presented on 02/11/2019 from group home as he has suicidal ideation and was wandering off in the woods. Prior to this patient had multiple ER visits with injuries after he ran out of his group home. Patient was admitted at behavioral health for suicidal ideation in November 2020. Initial psychiatric evaluation recommended inpatient psychiatric admission at [facility]. On 02/18/2019 psychiatric recommended patient is cleared from psychiatric point of view and now patient requires a locked memory care unit. While awaiting for placement in ER, EDP noted increasing confusion, generalized weakness and dysphagia and patient was referred for further work-up for admission. MRI brain negative for any acute stroke. Speech evaluation recommends dysphagia diet. Discussed with psychiatry IVC can be rescinded so it was performed on 04/07/2019. Although the patient does not have medical decision-making capacity therefore cannot leave AMA.   Subjective:  Patient is sleeping when I arrived.  He is arousable to voice alone.  States he is sleepy and turns over and goes back to sleep.  Objective: Vitals:   04/14/19 2119 04/14/19 2345 04/15/19 0338 04/15/19 0800  BP: 113/77 104/75 113/77 111/67  Pulse: 71 71 65 62  Resp: 16 14 16 16   Temp: 98.7 F (37.1 C) 98.3 F (36.8 C) 98.8 F (37.1 C) 97.9 F (36.6 C)  TempSrc: Oral Oral Oral   SpO2: 99% 97% 97% 96%  Weight:      Height:        Intake/Output Summary (Last 24 hours) at 04/15/2019 1304 Last data filed at 04/14/2019 1834 Gross per 24 hour  Intake 440 ml  Output --  Net 440 ml   Filed Weights   02/11/19 1441 03/28/19 0306  Weight: 77.1 kg 70.8 kg     Assessment & Plan:   59 year old man with paranoid schizophrenia, bipolar disorder and depression is awaiting  placement in a locked memory care unit.  It has been deemed that patient does not have decisional capacity therefore cannot leave AMA.  Depression, anxiety, bipolar disorder and schizophrenia. Frequent suicidal ideation. Admitted from a group home, was initially [place] after he wandered off and was suicidal. Does not have decisional capacity, does have a guardian. Presently awaiting placement in a locked memory care unit. Continue Lexapro, risperidone, mirtazapine, trazodone and prazosin Avoid Klonopin due to it causing extreme somnolence  Case management looking for memory care unit.   Patient cannot leave AMA.  Type 2 diabetes mellitus uncontrolled with chronic kidney disease stage IIIa as well as peripheral neuropathy. Diet controlled  BPH Continue Flomax  Dyslipidemia Continue statin  HTN Normotensive off all of his antihypertensives possibly secondary to weight loss.  Dysphagia, dysarthria, left facial droop CVA ruled out with MRI. Currently still has some dysphagia, pockets foods, but takes in fluids ok.   Severe Malnutrition On dietary supplements. From previous note: Related to social / environmental circumstances(inadequate oral intake)as evidenced by 21.6%weight loss over the past 2-3 months, moderate fat depletion, moderate-severe muscle depletion. Continue Remeron to attempt to enhance appetite Calorie count shows that the patient is able to tolerate 100% of his require calorie due to increased intake of the nutritional supplementation. Hoping for improvement down the road with the medicines.   DVT prophylaxis: Lovenox Code Status: Full Family Communication: Patient has a legal guardian, no family Disposition Plan: TBD, he is management looking for memory care locked  unit.   Consultants:  Psychiatry  Procedures:  None  Antimicrobials:  None   Exam:  General: Sleepy apathetic gentleman lying flat in bed in no distress Eyes: sclera anicteric,  conjuctiva mild injection bilaterally CVS: S1-S2 no murmur rubs or gallops Respiratory:  normal effort, symmetrical excursion, CTA without adventitious sounds.  GI: NABS, soft, NT, ND, no palpable masses.  LE: No edema. No cyanosis   Data Reviewed: Basic Metabolic Panel: Recent Labs  Lab 04/14/19 0414  CREATININE 1.15   Liver Function Tests: No results for input(s): AST, ALT, ALKPHOS, BILITOT, PROT, ALBUMIN in the last 168 hours. No results for input(s): LIPASE, AMYLASE in the last 168 hours. No results for input(s): AMMONIA in the last 168 hours. CBC: Recent Labs  Lab 04/14/19 0414  WBC 4.9  HGB 10.7*  HCT 30.3*  MCV 86.8  PLT 180   Cardiac Enzymes: No results for input(s): CKTOTAL, CKMB, CKMBINDEX, TROPONINI in the last 168 hours. BNP (last 3 results) No results for input(s): PROBNP in the last 8760 hours. CBG: Recent Labs  Lab 04/14/19 1151 04/14/19 1627 04/14/19 2044 04/15/19 0812 04/15/19 1139  GLUCAP 130* 94 103* 100* 94    No results found for this or any previous visit (from the past 240 hour(s)).    Studies: No results found.   Scheduled Meds: . atorvastatin  10 mg Oral Daily  . chlorhexidine  15 mL Mouth/Throat BID  . enoxaparin (LOVENOX) injection  40 mg Subcutaneous Q24H  . escitalopram  10 mg Oral Daily  . feeding supplement (NEPRO CARB STEADY)  237 mL Oral TID BM  . feeding supplement (PRO-STAT SUGAR FREE 64)  30 mL Oral BID WC  . gabapentin  300 mg Oral TID  . mirtazapine  15 mg Oral QHS  . multivitamin with minerals  1 tablet Oral Daily  . prazosin  2 mg Oral QHS  . risperiDONE  3 mg Oral BID  . senna-docusate  1 tablet Oral Daily  . tamsulosin  0.4 mg Oral Daily  . traZODone  150 mg Oral QHS   Continuous Infusions:  Principal Problem:   Paranoid schizophrenia ([place]) Active Problems:   Altered mental status   Dementia ([place])   Weakness   Acute metabolic encephalopathy   Protein-calorie malnutrition, severe   Pressure injury of  skin     Vashti Hey, MD, FACP, [facility]. [facility]  If 7PM-7AM, please contact night-coverage [email or web address] Password [email or web address] 04/15/2019, 1:04 PM    LOS: 17 days

## 2019-04-16 DIAGNOSIS — F200 Paranoid schizophrenia: Secondary | ICD-10-CM | POA: Diagnosis not present

## 2019-04-16 LAB — GLUCOSE, CAPILLARY
Glucose-Capillary: 86 mg/dL (ref 70–99)
Glucose-Capillary: 123 mg/dL — ABNORMAL HIGH (ref 70–99)
Glucose-Capillary: 128 mg/dL — ABNORMAL HIGH (ref 70–99)
Glucose-Capillary: 102 mg/dL — ABNORMAL HIGH (ref 70–99)

## 2019-04-16 MED ORDER — COLLAGENASE 250 UNIT/GM EX OINT
TOPICAL_OINTMENT | Freq: Every day | CUTANEOUS | Status: DC
  Filled 2019-04-16: qty 30

## 2019-04-16 NOTE — Progress Notes (Signed)
Occupational Therapy Treatment Patient Details Name: Peter Parsons MRN: [id number] DOB: 1960/07/21 Today's Date: 04/16/2019    History of present illness Pt is a 59 y.o. male initially presenting to hospital 02/11/20 with IVC from group home d/t wandering off into woods multiple times (found near water); pt reporting suicidal thoughts of drowning himself.  Pt tripped on his own feet and hit chin on chair on morning of 2/4 (pt got back up and walked to restroom independently).  Unable to return to group home d/t safety concerns.  Per chart review pt scored 19/30 on [facility] cognitive assessment with psychiatry indicative of moderate cognitive impairment (consistent with pt's diagnosis of dementia).  Pt was in [place] in ED for about 44 days prior to being admitted to hospital 2/13 for generalized weakness, dysphagia, and slurred speech.  MRI negative for acute CVA.  PMH includes DM, htn, schizophrenia.   OT comments  Pt seen for OT tx this date to f/u re: safety and sequencing with self care ADLs/ADL mobility. Pt in bed when OT presents. OT implements therapeutic use of self to encourage pt to get OOB as pt reports feeling "worried". In addition, OT provides education re: benefits of OOB activity. Pt agreeable to getting up to sink-side to perform self care grooming ADLs. Pt requires CGA/MIN A for sup<>sit and CGA for sit<>stand with no AD with use of gait belt as pt noted to have slight posterior lean/appears to stand with wt toward heels. Pt performs tasks with MIN verbal cues to initiate/sequence (ex: after brushing teeth, verbal/visual cue to rinse mouth, then verbal/visual cue to dry mouth). Overall pt tolerates well. Pt is returned to lying in bed with bed alarm activated and BST pulled close so he could reach items as pt is requesting his chocolate pudding. Overall, HHOT remains most appropriate d/c recommendation as pt requires assist with self care including physical for standing balance and cueing for  task sequencing.   Follow Up Recommendations  Home health OT;Supervision/Assistance - 24 hour    Equipment Recommendations  3 in 1 bedside commode;Other (comment)    Recommendations for Other Services      Precautions / Restrictions Precautions Precautions: Fall Precaution Comments: Suicide precautions; aspiration precautions Restrictions Weight Bearing Restrictions: No       Mobility Bed Mobility Overal bed mobility: Needs Assistance Bed Mobility: Supine to Sit;Sit to Supine     Supine to sit: Min guard;Min assist Sit to supine: Min guard;Min assist      Transfers Overall transfer level: Needs assistance Equipment used: None Transfers: Sit to/from Stand Sit to Stand: Min guard              Balance Overall balance assessment: Needs assistance Sitting-balance support: No upper extremity supported;Feet supported Sitting balance-Leahy Scale: Good Sitting balance - Comments: G static sitting balance Postural control: Posterior lean Standing balance support: No upper extremity supported Standing balance-Leahy Scale: Fair Standing balance comment: pt appears to put wt in heels when standing.                           ADL either performed or assessed with clinical judgement   ADL Overall ADL's : Needs assistance/impaired     Grooming: Wash/dry hands;Wash/dry face;Oral care;Min guard;Set up;Standing Grooming Details (indicate cue type and reason): MIN verbal cues to initiate tasks  Vision Patient Visual Report: No change from baseline     Perception     Praxis      Cognition Arousal/Alertness: Awake/alert Behavior During Therapy: Flat affect Overall Cognitive Status: Within Functional Limits for tasks assessed                                 General Comments: increased process/response time        Exercises Other Exercises Other Exercises: OT facilitates education re:  importance of OOB acitivty and pt agreeable. Other Exercises: OT facilitates implementation of therapeutic use of self to encourage pt as he reports feeling "worried".   Shoulder Instructions       General Comments      Pertinent Vitals/ Pain       Pain Assessment: Faces Faces Pain Scale: No hurt  Home Living                                          Prior Functioning/Environment              Frequency  Min 1X/week        Progress Toward Goals  OT Goals(current goals can now be found in the care plan section)  Progress towards OT goals: Progressing toward goals  Acute Rehab OT Goals Patient Stated Goal: to improve strength/balance OT Goal Formulation: With patient Time For Goal Achievement: 04/28/19 Potential to Achieve Goals: Good  Plan Discharge plan remains appropriate;Frequency needs to be updated    Co-evaluation                 AM-PAC OT "6 Clicks" Daily Activity     Outcome Measure   Help from another person eating meals?: None Help from another person taking care of personal grooming?: A Lot Help from another person toileting, which includes using toliet, bedpan, or urinal?: A Little Help from another person bathing (including washing, rinsing, drying)?: A Lot Help from another person to put on and taking off regular upper body clothing?: A Little Help from another person to put on and taking off regular lower body clothing?: A Little 6 Click Score: 17    End of Session Equipment Utilized During Treatment: Gait belt  OT Visit Diagnosis: Unsteadiness on feet (R26.81);Muscle weakness (generalized) (M62.81)   Activity Tolerance Patient tolerated treatment well;No increased pain   Patient Left with call bell/phone within reach;in bed;with bed alarm set   Nurse Communication          Time: [id number] OT Time Calculation (min): 24 min  Charges: OT General Charges $OT Visit: 1 Visit OT Treatments $Self Care/Home  Management : 23-37 mins  Gerrianne Scale, MS, OTR/L ascom [phone] 04/16/19, 3:59 PM

## 2019-04-16 NOTE — Consult Note (Addendum)
WOC Nurse Consult Note: Reason for Consult: Consult requested for sacrum and heels Wound type: Sacrum with red moist patchy areas of partial thickness skin loss; appearance consistent with moisture associated skin damage.  In the center, there are 2 areas of park-purple deep tissue pressure injuries which are beginning to have loose peeling skin and evolving into unstageable pressure injuries; 2X2cm to sacrum. Entire affected areas to sacrum/bilat buttocks is 4X4cm Right heel with dark red-purple deep tissue pressure injury; .3X.3cm.  Right ankle with dark red-purple deep tissue pressure injury; .2X.2cm Left heel with intact dark red fluid filled blister; deep tissue pressure injury 4X4cm Pressure Injury POA: no Dressing procedure/placement/frequency: Topical treatment orders provided for bedside nurses to perform as follows to assist with enzymatic debridement of nonviable tissue: Apply Santyl to dark colored sacrum wounds Q day, then cover with moist 2X2 and foam dressing.  (Change foam dressing Q 3 days or PRN soiling.)  Float heels to reduce pressure and protect from further injury with foam dressings.  Please re-consult if further assistance is needed.  Thank-you,  Julien Girt MSN, [place], [place], [place], [place]

## 2019-04-16 NOTE — Progress Notes (Signed)
Physical Therapy Treatment Patient Details Name: Peter Parsons MRN: [id number] DOB: [birth year] Today's Date: 04/16/2019    History of Present Illness Pt is a 59 y.o. male initially presenting to hospital 02/11/20 with IVC from group home d/t wandering off into woods multiple times (found near water); pt reporting suicidal thoughts of drowning himself.  Pt tripped on his own feet and hit chin on chair on morning of 2/4 (pt got back up and walked to restroom independently).  Unable to return to group home d/t safety concerns.  Per chart review pt scored 19/30 on [facility] cognitive assessment with psychiatry indicative of moderate cognitive impairment (consistent with pt's diagnosis of dementia).  Pt was in [place] in ED for about 44 days prior to being admitted to hospital 2/13 for generalized weakness, dysphagia, and slurred speech.  MRI negative for acute CVA.  PMH includes DM, htn, schizophrenia.    PT Comments    Slow to respond but agrees to gait.  Min a x 1 to initiate movement but overall able to do with no assist.  Stood with min guard. Pt did not verbalize when given a choice but reached for walker for gait.  He is able to walk a complete lap around unit x 1 with RW and min guard.   Min assist at times for verbal cues and to correct walker as it drifts left and runs into objects despite verbal cues to correct.  Chose to return to bed upon return to room.   Follow Up Recommendations  Home health PT;Supervision/Assistance - 24 hour     Equipment Recommendations  Rolling walker with 5" wheels;3in1 (PT)    Recommendations for Other Services       Precautions / Restrictions Precautions Precautions: Fall Precaution Comments: Suicide precautions; aspiration precautions Restrictions Weight Bearing Restrictions: No    Mobility  Bed Mobility Overal bed mobility: Needs Assistance Bed Mobility: Supine to Sit;Sit to Supine     Supine to sit: Min guard;Min assist Sit to supine: Min guard;Min  assist   General bed mobility comments: only lighty assist needed to reposition in bed.  Transfers Overall transfer level: Needs assistance Equipment used: None   Sit to Stand: Min guard            Ambulation/Gait Ambulation/Gait assistance: Herbalist (Feet): 160 Feet Assistive device: Rolling walker (2 wheeled) Gait Pattern/deviations: Step-through pattern;Decreased step length - right;Decreased step length - left;Trunk flexed Gait velocity: decreased   General Gait Details: short shuffling gait with assist to steer walker at times due to left drift despite cues   Stairs             Wheelchair Mobility    Modified Rankin (Stroke Patients Only)       Balance Overall balance assessment: Needs assistance Sitting-balance support: No upper extremity supported;Feet supported Sitting balance-Leahy Scale: Good     Standing balance support: No upper extremity supported Standing balance-Leahy Scale: Fair Standing balance comment: generally steady but does have one small LOB while walking around bed.                            Cognition Arousal/Alertness: Awake/alert Behavior During Therapy: Flat affect Overall Cognitive Status: Within Functional Limits for tasks assessed                                 General Comments: Increased time for  processing and to respond to questions or initiate movement for activities      Exercises      General Comments        Pertinent Vitals/Pain Pain Assessment: No/denies pain    Home Living                      Prior Function            PT Goals (current goals can now be found in the care plan section) Progress towards PT goals: Progressing toward goals    Frequency    Min 2X/week      PT Plan Current plan remains appropriate    Co-evaluation              AM-PAC PT "6 Clicks" Mobility   Outcome Measure  Help needed turning from your back to your  side while in a flat bed without using bedrails?: A Little Help needed moving from lying on your back to sitting on the side of a flat bed without using bedrails?: A Little Help needed moving to and from a bed to a chair (including a wheelchair)?: A Little Help needed standing up from a chair using your arms (e.g., wheelchair or bedside chair)?: A Little Help needed to walk in hospital room?: A Little Help needed climbing 3-5 steps with a railing? : A Little 6 Click Score: 18    End of Session Equipment Utilized During Treatment: Gait belt Activity Tolerance: Patient tolerated treatment well Patient left: in bed;with call bell/phone within reach;with bed alarm set Nurse Communication: Mobility status;Precautions       Time: [id number] PT Time Calculation (min) (ACUTE ONLY): 8 min  Charges:  $Gait Training: 8-22 mins                    Chesley Noon, PTA 04/16/19, 10:27 AM

## 2019-04-16 NOTE — Progress Notes (Signed)
PROGRESS NOTE    Peter Parsons  [id number]  DOB: June 05, 1960  DOA: 02/11/2019 PCP: System, Provider Not In Outpatient Specialists:   Hospital course:  Initially presented on 02/11/2019 from group home as he has suicidal ideation and was wandering off in the woods. Prior to this patient had multiple ER visits with injuries after he ran out of his group home. Patient was admitted at behavioral health for suicidal ideation in November 2020. Initial psychiatric evaluation recommended inpatient psychiatric admission at [facility]. On 02/18/2019 psychiatric recommended patient is cleared from psychiatric point of view and now patient requires a locked memory care unit. While awaiting for placement in ER, EDP noted increasing confusion, generalized weakness and dysphagia and patient was referred for further work-up for admission. MRI brain negative for any acute stroke. Speech evaluation recommends dysphagia diet. Discussed with psychiatry IVC can be rescinded so it was performed on 04/07/2019. Although the patient does not have medical decision-making capacity therefore cannot leave AMA.   Subjective:  Patient is awake and lying in bed, apathetic affect.  Patient answers questions and monosyllabic answers.  Patient does get out of bed intermittently.  Has no complaints.  Objective: Vitals:   04/15/19 2221 04/15/19 2334 04/16/19 0212 04/16/19 0726  BP: 119/84 97/66 100/63 117/83  Pulse: 86 62 63 70  Resp: 20 19    Temp: 99.6 F (37.6 C) 98.1 F (36.7 C) 98.5 F (36.9 C) 97.6 F (36.4 C)  TempSrc: Oral Oral Oral Oral  SpO2: 99% 95% 96% 98%  Weight:      Height:        Intake/Output Summary (Last 24 hours) at 04/16/2019 1411 Last data filed at 04/15/2019 2043 Gross per 24 hour  Intake 237 ml  Output 451 ml  Net -214 ml   Filed Weights   02/11/19 1441 03/28/19 0306  Weight: 77.1 kg 70.8 kg     Assessment & Plan:   59 year old man with paranoid schizophrenia,  bipolar disorder and depression is awaiting placement in a locked memory care unit.  It has been deemed that patient does not have decisional capacity therefore cannot leave AMA.  Debilitation Patient is lying in bed in the same position every time I come in to see him, Will ask nurses to ambulate patient 3 times a day at least to make sure he does not lose core muscle strength.  Depression, anxiety, bipolar disorder and schizophrenia. Frequent suicidal ideation. Admitted from a group home, was initially [place] after he wandered off and was suicidal. Does not have decisional capacity, does have a guardian. Presently awaiting placement in a locked memory care unit. Continue Lexapro, risperidone, mirtazapine, trazodone and prazosin Avoid Klonopin due to it causing extreme somnolence  Case management looking for memory care unit.   Patient cannot leave AMA.  Type 2 diabetes mellitus uncontrolled with chronic kidney disease stage IIIa as well as peripheral neuropathy. Diet controlled  BPH Continue Flomax  Dyslipidemia Continue statin  HTN Normotensive off all of his antihypertensives possibly secondary to weight loss.  Dysphagia, dysarthria, left facial droop CVA ruled out with MRI. Currently still has some dysphagia, pockets foods, but takes in fluids ok.   Severe Malnutrition On dietary supplements. From previous note: Related to social / environmental circumstances(inadequate oral intake)as evidenced by 21.6%weight loss over the past 2-3 months, moderate fat depletion, moderate-severe muscle depletion. Continue Remeron to attempt to enhance appetite Calorie count shows that the patient is able to tolerate 100% of his require calorie due  to increased intake of the nutritional supplementation. Hoping for improvement down the road with the medicines.   DVT prophylaxis: Lovenox Code Status: Full Family Communication: Patient has a legal guardian, no family Disposition  Plan: TBD, he is management looking for memory care locked unit.   Consultants:  Psychiatry  Procedures:  None  Antimicrobials:  None   Exam:  General: Awake but apathetic gentleman lying flat in bed in no distress Eyes: sclera anicteric, conjuctiva mild injection bilaterally CVS: S1-S2 no murmur rubs or gallops Respiratory:  normal effort, symmetrical excursion, CTA without adventitious sounds.  GI: NABS, soft, NT, ND, no palpable masses.  LE: No edema. No cyanosis   Data Reviewed: Basic Metabolic Panel: Recent Labs  Lab 04/14/19 0414  CREATININE 1.15   Liver Function Tests: No results for input(s): AST, ALT, ALKPHOS, BILITOT, PROT, ALBUMIN in the last 168 hours. No results for input(s): LIPASE, AMYLASE in the last 168 hours. No results for input(s): AMMONIA in the last 168 hours. CBC: Recent Labs  Lab 04/14/19 0414  WBC 4.9  HGB 10.7*  HCT 30.3*  MCV 86.8  PLT 180   Cardiac Enzymes: No results for input(s): CKTOTAL, CKMB, CKMBINDEX, TROPONINI in the last 168 hours. BNP (last 3 results) No results for input(s): PROBNP in the last 8760 hours. CBG: Recent Labs  Lab 04/15/19 1139 04/15/19 1644 04/15/19 2102 04/16/19 0747 04/16/19 1137  GLUCAP 94 125* 124* 86 123*    No results found for this or any previous visit (from the past 240 hour(s)).    Studies: No results found.   Scheduled Meds: . atorvastatin  10 mg Oral Daily  . chlorhexidine  15 mL Mouth/Throat BID  . collagenase   Topical Daily  . enoxaparin (LOVENOX) injection  40 mg Subcutaneous Q24H  . escitalopram  10 mg Oral Daily  . feeding supplement (NEPRO CARB STEADY)  237 mL Oral TID BM  . feeding supplement (PRO-STAT SUGAR FREE 64)  30 mL Oral BID WC  . gabapentin  300 mg Oral TID  . mirtazapine  15 mg Oral QHS  . multivitamin with minerals  1 tablet Oral Daily  . prazosin  2 mg Oral QHS  . risperiDONE  3 mg Oral BID  . senna-docusate  1 tablet Oral Daily  . tamsulosin  0.4 mg  Oral Daily  . traZODone  150 mg Oral QHS   Continuous Infusions:  Principal Problem:   Paranoid schizophrenia ([place]) Active Problems:   Altered mental status   Dementia ([place])   Weakness   Acute metabolic encephalopathy   Protein-calorie malnutrition, severe   Pressure injury of skin     Vashti Hey, MD, FACP, [facility]. [facility]  If 7PM-7AM, please contact night-coverage [email or web address] Password [email or web address] 04/16/2019, 2:11 PM    LOS: 18 days

## 2019-04-16 NOTE — TOC Progression Note (Signed)
Transition of Care [facility]) - Progression Note    Patient Details  Name: Peter Parsons MRN: [id number] Date of Birth: [birth year]  [facility] CM/SW Contact  Shelbie Ammons, RN Phone Number: 04/16/2019, 1:15 PM  Clinical Narrative:   RNCM spoke with Joseph Art at [facility], additional questions answered and faxed wound nurses notes. She verbalized that she would send this up to her leadership for review.     Expected Discharge Plan: Group Home Barriers to Discharge: SNF Pending bed offer  Expected Discharge Plan and Services Expected Discharge Plan: Group Home   Discharge Planning Services: CM Consult   Living arrangements for the past 2 months: Group Home                 DME Arranged: N/A DME Agency: [employer] Date DME Agency Contacted: 04/12/19 Time DME Agency Contacted: [phone] Representative spoke with at DME Agency: none needed [place] Arranged: NA           Social Determinants of Health (SDOH) Interventions    Readmission Risk Interventions No flowsheet data found.

## 2019-04-17 DIAGNOSIS — F200 Paranoid schizophrenia: Secondary | ICD-10-CM | POA: Diagnosis not present

## 2019-04-17 LAB — GLUCOSE, CAPILLARY
Glucose-Capillary: 84 mg/dL (ref 70–99)
Glucose-Capillary: 95 mg/dL (ref 70–99)

## 2019-04-17 LAB — BASIC METABOLIC PANEL
Anion gap: 11 (ref 5–15)
BUN: 39 mg/dL — ABNORMAL HIGH (ref 6–20)
CO2: 33 mmol/L — ABNORMAL HIGH (ref 22–32)
Calcium: 9.3 mg/dL (ref 8.9–10.3)
Chloride: 99 mmol/L (ref 98–111)
Creatinine, Ser: 1 mg/dL (ref 0.61–1.24)
GFR calc Af Amer: >60 mL/min (ref >60)
GFR calc non Af Amer: >60 mL/min (ref >60)
Glucose, Bld: 91 mg/dL (ref 70–99)
Potassium: 3.5 mmol/L (ref 3.5–5.1)
Sodium: 143 mmol/L (ref 135–145)

## 2019-04-17 NOTE — Progress Notes (Addendum)
Walked patient for primary RN, Malka. Pt able to follow instructions, but processing appears delayed. Pt able to successfully walk using FWW with a steady gait. Pt ambulated approximately 50 feet then sat up in chair with chair alarm in place and activated.

## 2019-04-17 NOTE — Progress Notes (Signed)
PROGRESS NOTE    Peter Parsons  [id number]  DOB: 14-Jan-1961  DOA: 02/11/2019 PCP: System, Provider Not In Outpatient Specialists:   Hospital course:  Initially presented on 02/11/2019 from group home as he has suicidal ideation and was wandering off in the woods. Prior to this patient had multiple ER visits with injuries after he ran out of his group home. Patient was admitted at behavioral health for suicidal ideation in November 2020. Initial psychiatric evaluation recommended inpatient psychiatric admission at [facility]. On 02/18/2019 psychiatric recommended patient is cleared from psychiatric point of view and now patient requires a locked memory care unit. While awaiting for placement in ER, EDP noted increasing confusion, generalized weakness and dysphagia and patient was referred for further work-up for admission. MRI brain negative for any acute stroke. Speech evaluation recommends dysphagia diet. Discussed with psychiatry IVC can be rescinded so it was performed on 04/07/2019. Although the patient does not have medical decision-making capacity therefore cannot leave AMA.   Subjective:  Spoke with patient and encouraged him to be more active and mobile.  Patient mentions the word "doom".  Said he liked where he was.  Patient did not agree to be more mobile but he did listen to me.  Objective: Vitals:   04/16/19 2240 04/17/19 0419 04/17/19 0748 04/17/19 1613  BP: 133/83 108/83 112/67 111/63  Pulse: 87 85 (!) 57 71  Resp: 20 16 18 18   Temp: 99 F (37.2 C) 98.4 F (36.9 C) 98.6 F (37 C) 98.9 F (37.2 C)  TempSrc: Oral Oral Oral Oral  SpO2: 99% 96% 98% 95%  Weight:      Height:        Intake/Output Summary (Last 24 hours) at 04/17/2019 1644 Last data filed at 04/17/2019 1516 Gross per 24 hour  Intake 0 ml  Output 875 ml  Net -875 ml   Filed Weights   02/11/19 1441 03/28/19 0306  Weight: 77.1 kg 70.8 kg     Assessment & Plan:   59 year old man  with paranoid schizophrenia, bipolar disorder and depression is awaiting placement in a locked memory care unit.  It has been deemed that patient does not have decisional capacity therefore cannot leave AMA.  Debilitation Encourage patient to be more active and mobile, patient did not seem to be convinced of the necessity of that Will ask nurses to ambulate patient 3 times a day at least to make sure he does not lose core muscle strength.  Depression, anxiety, bipolar disorder and schizophrenia. Frequent suicidal ideation. Admitted from a group home, was initially [place] after he wandered off and was suicidal. Does not have decisional capacity, does have a guardian. Presently awaiting placement in a locked memory care unit. Continue Lexapro, risperidone, mirtazapine, trazodone and prazosin Avoid Klonopin due to it causing extreme somnolence  Case management looking for memory care unit.   Patient cannot leave AMA.  Type 2 diabetes mellitus uncontrolled with chronic kidney disease stage IIIa as well as peripheral neuropathy. Diet controlled  BPH Continue Flomax  Dyslipidemia Continue statin  HTN Normotensive off all of his antihypertensives possibly secondary to weight loss.  Dysphagia, dysarthria, left facial droop CVA ruled out with MRI. Currently still has some dysphagia, pockets foods, but takes in fluids ok.   Severe Malnutrition On dietary supplements. From previous note: Related to social / environmental circumstances(inadequate oral intake)as evidenced by 21.6%weight loss over the past 2-3 months, moderate fat depletion, moderate-severe muscle depletion. Continue Remeron to attempt to enhance appetite  Calorie count shows that the patient is able to tolerate 100% of his require calorie due to increased intake of the nutritional supplementation. Hoping for improvement down the road with the medicines.   DVT prophylaxis: Lovenox Code Status: Full Family  Communication: Patient has a legal guardian, no family Disposition Plan: TBD, he is management looking for memory care locked unit.   Consultants:  Psychiatry  Procedures:  None  Antimicrobials:  None   Exam:  General: Awake but apathetic gentleman lying flat in bed in no distress Eyes: sclera anicteric, conjuctiva mild injection bilaterally CVS: S1-S2 no murmur rubs or gallops Respiratory:  normal effort, symmetrical excursion, CTA without adventitious sounds.  GI: NABS, soft, NT, ND, no palpable masses.  LE: No edema. No cyanosis   Data Reviewed: Basic Metabolic Panel: Recent Labs  Lab 04/14/19 0414 04/17/19 0540  NA  --  143  K  --  3.5  CL  --  99  CO2  --  33*  GLUCOSE  --  91  BUN  --  39*  CREATININE 1.15 1.00  CALCIUM  --  9.3   Liver Function Tests: No results for input(s): AST, ALT, ALKPHOS, BILITOT, PROT, ALBUMIN in the last 168 hours. No results for input(s): LIPASE, AMYLASE in the last 168 hours. No results for input(s): AMMONIA in the last 168 hours. CBC: Recent Labs  Lab 04/14/19 0414  WBC 4.9  HGB 10.7*  HCT 30.3*  MCV 86.8  PLT 180   Cardiac Enzymes: No results for input(s): CKTOTAL, CKMB, CKMBINDEX, TROPONINI in the last 168 hours. BNP (last 3 results) No results for input(s): PROBNP in the last 8760 hours. CBG: Recent Labs  Lab 04/16/19 1137 04/16/19 1619 04/16/19 2115 04/17/19 0756 04/17/19 1147  GLUCAP 123* 128* 102* 84 95    No results found for this or any previous visit (from the past 240 hour(s)).    Studies: No results found.   Scheduled Meds: . atorvastatin  10 mg Oral Daily  . chlorhexidine  15 mL Mouth/Throat BID  . collagenase   Topical Daily  . enoxaparin (LOVENOX) injection  40 mg Subcutaneous Q24H  . escitalopram  10 mg Oral Daily  . feeding supplement (NEPRO CARB STEADY)  237 mL Oral TID BM  . feeding supplement (PRO-STAT SUGAR FREE 64)  30 mL Oral BID WC  . gabapentin  300 mg Oral TID  .  mirtazapine  15 mg Oral QHS  . multivitamin with minerals  1 tablet Oral Daily  . prazosin  2 mg Oral QHS  . risperiDONE  3 mg Oral BID  . senna-docusate  1 tablet Oral Daily  . tamsulosin  0.4 mg Oral Daily  . traZODone  150 mg Oral QHS   Continuous Infusions:  Principal Problem:   Paranoid schizophrenia ([place]) Active Problems:   Altered mental status   Dementia ([place])   Weakness   Acute metabolic encephalopathy   Protein-calorie malnutrition, severe   Pressure injury of skin     Vashti Hey, MD, FACP, [facility]. [facility]  If 7PM-7AM, please contact night-coverage [email or web address] Password [email or web address] 04/17/2019, 4:44 PM    LOS: 19 days

## 2019-04-18 DIAGNOSIS — F200 Paranoid schizophrenia: Secondary | ICD-10-CM | POA: Diagnosis not present

## 2019-04-18 NOTE — Progress Notes (Signed)
PROGRESS NOTE    Peter Parsons  [id number]  DOB: Aug 05, 1960  DOA: 02/11/2019 PCP: System, Provider Not In Outpatient Specialists:   Hospital course:  Initially presented on 02/11/2019 from group home as he has suicidal ideation and was wandering off in the woods. Prior to this patient had multiple ER visits with injuries after he ran out of his group home. Patient was admitted at behavioral health for suicidal ideation in November 2020. Initial psychiatric evaluation recommended inpatient psychiatric admission at [facility]. On 02/18/2019 psychiatric recommended patient is cleared from psychiatric point of view and now patient requires a locked memory care unit. While awaiting for placement in ER, EDP noted increasing confusion, generalized weakness and dysphagia and patient was referred for further work-up for admission. MRI brain negative for any acute stroke. Speech evaluation recommends dysphagia diet. Discussed with psychiatry IVC can be rescinded so it was performed on 04/07/2019. Although the patient does not have medical decision-making capacity therefore cannot leave AMA.   Subjective:  Patient is lying in bed and answers monosyllabically as before.  He barely moves.  Objective: Vitals:   04/17/19 2001 04/18/19 0018 04/18/19 0449 04/18/19 0729  BP: 105/62 125/90 121/80 105/67  Pulse: 70 82 (!) 54 (!) 58  Resp: 16 18 16    Temp: 99 F (37.2 C) 98.5 F (36.9 C) 98.2 F (36.8 C) 97.6 F (36.4 C)  TempSrc: Oral Oral  Oral  SpO2: 99% 99% 100% 99%  Weight:      Height:        Intake/Output Summary (Last 24 hours) at 04/18/2019 1554 Last data filed at 04/18/2019 1330 Gross per 24 hour  Intake 357 ml  Output 750 ml  Net -393 ml   Filed Weights   02/11/19 1441 03/28/19 0306  Weight: 77.1 kg 70.8 kg     Assessment & Plan:   59 year old man with paranoid schizophrenia, bipolar disorder and depression is awaiting placement in a locked memory care unit.   It has been deemed that patient does not have decisional capacity therefore cannot leave AMA.  Debilitation Encourage patient to be more active and mobile, patient did not seem to be convinced of the necessity of that Will ask nurses to ambulate patient 3 times a day at least to make sure he does not lose core muscle strength.  Depression, anxiety, bipolar disorder and schizophrenia. Frequent suicidal ideation. Patient seems to be almost catatonic, he also seems to be attending elsewhere, possibly having active hallucinations.  Will request psychiatry to see patient again to see if anything else can be done for him.  Admitted from a group home, was initially [facility] after he wandered off and was suicidal. Does not have decisional capacity, does have a guardian. Presently awaiting placement in a locked memory care unit. Continue Lexapro, risperidone, mirtazapine, trazodone and prazosin Avoid Klonopin due to it causing extreme somnolence  Case management looking for memory care unit.   Patient cannot leave AMA.  Type 2 diabetes mellitus uncontrolled with chronic kidney disease stage IIIa as well as peripheral neuropathy. Diet controlled  BPH Continue Flomax  Dyslipidemia Continue statin  HTN Normotensive off all of his antihypertensives possibly secondary to weight loss.  Dysphagia, dysarthria, left facial droop CVA ruled out with MRI. Currently still has some dysphagia, pockets foods, but takes in fluids ok.   Severe Malnutrition On dietary supplements. From previous note: Related to social / environmental circumstances(inadequate oral intake)as evidenced by 21.6%weight loss over the past 2-3 months, moderate fat  depletion, moderate-severe muscle depletion. Continue Remeron to attempt to enhance appetite Calorie count shows that the patient is able to tolerate 100% of his require calorie due to increased intake of the nutritional supplementation. Hoping for improvement  down the road with the medicines.   DVT prophylaxis: Lovenox Code Status: Full Family Communication: Patient has a legal guardian, no family Disposition Plan: TBD, he is management looking for memory care locked unit.   Consultants:  Psychiatry  Procedures:  None  Antimicrobials:  None   Exam:  General: Awake but apathetic gentleman lying flat in bed in no distress Eyes: sclera anicteric, conjuctiva mild injection bilaterally CVS: S1-S2 no murmur rubs or gallops Respiratory:  normal effort, symmetrical excursion, CTA without adventitious sounds.  GI: NABS, soft, NT, ND, no palpable masses.  LE: No edema. No cyanosis   Data Reviewed: Basic Metabolic Panel: Recent Labs  Lab 04/14/19 0414 04/17/19 0540  NA  --  143  K  --  3.5  CL  --  99  CO2  --  33*  GLUCOSE  --  91  BUN  --  39*  CREATININE 1.15 1.00  CALCIUM  --  9.3   Liver Function Tests: No results for input(s): AST, ALT, ALKPHOS, BILITOT, PROT, ALBUMIN in the last 168 hours. No results for input(s): LIPASE, AMYLASE in the last 168 hours. No results for input(s): AMMONIA in the last 168 hours. CBC: Recent Labs  Lab 04/14/19 0414  WBC 4.9  HGB 10.7*  HCT 30.3*  MCV 86.8  PLT 180   Cardiac Enzymes: No results for input(s): CKTOTAL, CKMB, CKMBINDEX, TROPONINI in the last 168 hours. BNP (last 3 results) No results for input(s): PROBNP in the last 8760 hours. CBG: Recent Labs  Lab 04/16/19 1137 04/16/19 1619 04/16/19 2115 04/17/19 0756 04/17/19 1147  GLUCAP 123* 128* 102* 84 95    No results found for this or any previous visit (from the past 240 hour(s)).    Studies: No results found.   Scheduled Meds: . atorvastatin  10 mg Oral Daily  . chlorhexidine  15 mL Mouth/Throat BID  . collagenase   Topical Daily  . enoxaparin (LOVENOX) injection  40 mg Subcutaneous Q24H  . escitalopram  10 mg Oral Daily  . feeding supplement (NEPRO CARB STEADY)  237 mL Oral TID BM  . feeding  supplement (PRO-STAT SUGAR FREE 64)  30 mL Oral BID WC  . gabapentin  300 mg Oral TID  . mirtazapine  15 mg Oral QHS  . multivitamin with minerals  1 tablet Oral Daily  . prazosin  2 mg Oral QHS  . risperiDONE  3 mg Oral BID  . senna-docusate  1 tablet Oral Daily  . tamsulosin  0.4 mg Oral Daily  . traZODone  150 mg Oral QHS   Continuous Infusions:  Principal Problem:   Paranoid schizophrenia ([place]) Active Problems:   Altered mental status   Dementia ([place])   Weakness   Acute metabolic encephalopathy   Protein-calorie malnutrition, severe   Pressure injury of skin     Vashti Hey, MD, FACP, [facility]  If 7PM-7AM, please contact night-coverage [email or web address] Password [email or web address] 04/18/2019, 3:54 PM    LOS: 20 days

## 2019-04-18 NOTE — Plan of Care (Signed)
  Problem: Education: Goal: Knowledge of General Education information will improve Description: Including pain rating scale, medication(s)/side effects and non-pharmacologic comfort measures Outcome: Progressing   Problem: Activity: Goal: Risk for activity intolerance will decrease Outcome: Progressing   Problem: Nutrition: Goal: Adequate nutrition will be maintained Outcome: Progressing   Problem: Pain Managment: Goal: General experience of comfort will improve Outcome: Progressing   Problem: Safety: Goal: Ability to remain free from injury will improve Outcome: Progressing   Problem: Skin Integrity: Goal: Risk for impaired skin integrity will decrease Outcome: Progressing   Problem: Education: Goal: Knowledge of disease or condition will improve Outcome: Progressing Goal: Knowledge of secondary prevention will improve Outcome: Progressing Goal: Knowledge of patient specific risk factors addressed and post discharge goals established will improve Outcome: Progressing   Problem: Nutrition: Goal: Risk of aspiration will decrease Outcome: Progressing   Problem: Intracerebral Hemorrhage Tissue Perfusion: Goal: Complications of Intracerebral Hemorrhage will be minimized Outcome: Progressing   Problem: Ischemic Stroke/TIA Tissue Perfusion: Goal: Complications of ischemic stroke/TIA will be minimized Outcome: Progressing

## 2019-04-19 DIAGNOSIS — F200 Paranoid schizophrenia: Secondary | ICD-10-CM

## 2019-04-19 LAB — CBC
HCT: 35.3 % — ABNORMAL LOW (ref 39.0–52.0)
Hemoglobin: 12.1 g/dL — ABNORMAL LOW (ref 13.0–17.0)
MCH: 30.5 pg (ref 26.0–34.0)
MCHC: 34.3 g/dL (ref 30.0–36.0)
MCV: 88.9 fL (ref 80.0–100.0)
Platelets: 177 10*3/uL (ref 150–400)
RBC: 3.97 MIL/uL — ABNORMAL LOW (ref 4.22–5.81)
RDW: 14.5 % (ref 11.5–15.5)
WBC: 5.1 10*3/uL (ref 4.0–10.5)
nRBC: 0 % (ref 0.0–0.2)

## 2019-04-19 LAB — COMPREHENSIVE METABOLIC PANEL
ALT: 18 U/L (ref 0–44)
AST: 19 U/L (ref 15–41)
Albumin: 4 g/dL (ref 3.5–5.0)
Alkaline Phosphatase: 58 U/L (ref 38–126)
Anion gap: 7 (ref 5–15)
BUN: 40 mg/dL — ABNORMAL HIGH (ref 6–20)
CO2: 34 mmol/L — ABNORMAL HIGH (ref 22–32)
Calcium: 9.6 mg/dL (ref 8.9–10.3)
Chloride: 100 mmol/L (ref 98–111)
Creatinine, Ser: 1.09 mg/dL (ref 0.61–1.24)
GFR calc Af Amer: >60 mL/min (ref >60)
GFR calc non Af Amer: >60 mL/min (ref >60)
Glucose, Bld: 142 mg/dL — ABNORMAL HIGH (ref 70–99)
Potassium: 4 mmol/L (ref 3.5–5.1)
Sodium: 141 mmol/L (ref 135–145)
Total Bilirubin: 0.6 mg/dL (ref 0.3–1.2)
Total Protein: 7.2 g/dL (ref 6.5–8.1)

## 2019-04-19 MED ORDER — PALIPERIDONE PALMITATE ER 234 MG/1.5ML IM SUSY
234.0000 mg | PREFILLED_SYRINGE | Freq: Once | INTRAMUSCULAR | Status: AC
  Administered 2019-04-19: 234 mg via INTRAMUSCULAR
  Filled 2019-04-19: qty 1.5

## 2019-04-19 MED ORDER — MIRTAZAPINE 15 MG PO TABS
45.0000 mg | ORAL_TABLET | Freq: Every day | ORAL | Status: DC
  Administered 2019-04-19 – 2019-06-09 (×48): 45 mg via ORAL
  Filled 2019-04-19 (×50): qty 3

## 2019-04-19 NOTE — Progress Notes (Signed)
Nutrition Follow-up  DOCUMENTATION CODES:   Severe malnutrition in context of social or environmental circumstances  INTERVENTION:  Continue Nepro Shake po TID, each supplement provides 425 kcal and 19 grams protein  Continue Pro-Stat 30 ml po BID each meals, each supplement provides 100 kcal and 15 grams of protein  Continue Magic cup TID with meals, each supplement provides 290 kcal and 9 grams of protein  Continue daily MVI  Recommend measuring weekly weights  NUTRITION DIAGNOSIS:   Severe Malnutrition related to social / environmental circumstances(inadequate oral intake) as evidenced by percent weight loss, moderate fat depletion, moderate muscle depletion, severe muscle depletion.  Ongoing.  GOAL:   Patient will meet greater than or equal to 90% of their needs  Progressing.  MONITOR:   PO intake, Supplement acceptance, Labs, Weight trends, I & O's  REASON FOR ASSESSMENT:   Malnutrition Screening Tool    ASSESSMENT:   59 year old male with PMHx of schizophrenia, HTN, DM who has been in [place] for 44 days and is now admitted with generalized weakness, dysphagia, slurred speech, left facial droop to rule out CVA.  Met with patient at bedside. He continues to be slow to respond to questions but does endorse he is more hungry now. According to chart PO intake has improved over the weekend. He ate 90% of breakfast yesterday, 100% of lunch yesterday, and 95% of dinner last night. He had not yet started on his breakfast tray but had already started on a Nepro shake this morning  Medications reviewed and include: gabapentin, Remeron 15 mg QHS, MVI daily, senna-docusate 1 tablet daily, Flomax.  Labs reviewed: CBG 84-128, CO2 33, BUN 39.  No weight taken since admission to trend.  Diet Order:   Diet Order            DIET - DYS 1 Room service appropriate? Yes with Assist; Fluid consistency: Thin  Diet effective now             EDUCATION NEEDS:   No education  needs have been identified at this time  Skin:  Skin Assessment: Skin Integrity Issues:(unstageable to coccyx; DTIs to bilateral heels and right ankle)  Last BM:  04/16/2019 small type 1  Height:   Ht Readings from Last 1 Encounters:  03/28/19 5' 9" (1.753 m)   Weight:   Wt Readings from Last 1 Encounters:  03/28/19 70.8 kg   Ideal Body Weight:  72.7 kg  BMI:  Body mass index is 23.05 kg/m.  Estimated Nutritional Needs:   Kcal:  1800-2000  Protein:  90-100 grams  Fluid:  1.8-2 L/day  Jacklynn Barnacle, MS, RD, LDN Pager number available on Amion

## 2019-04-19 NOTE — Progress Notes (Signed)
PROGRESS NOTE    Peter Parsons  [id number]  DOB: 1960-12-01  DOA: 02/11/2019 PCP: System, Provider Not In Outpatient Specialists:   Hospital course:  Initially presented on 02/11/2019 from group home as he has suicidal ideation and was wandering off in the woods. Prior to this patient had multiple ER visits with injuries after he ran out of his group home. Patient was admitted at behavioral health for suicidal ideation in November 2020. Initial psychiatric evaluation recommended inpatient psychiatric admission at [facility]. On 02/18/2019 psychiatric recommended patient is cleared from psychiatric point of view and now patient requires a locked memory care unit. While awaiting for placement in ER, EDP noted increasing confusion, generalized weakness and dysphagia and patient was referred for further work-up for admission. MRI brain negative for any acute stroke. Speech evaluation recommends dysphagia diet. Discussed with psychiatry IVC can be rescinded so it was performed on 04/07/2019. Although the patient does not have medical decision-making capacity therefore cannot leave AMA.   Subjective:  Patient is lying in bed, he answered me in a full sentence today by "it is the same".  Objective: Vitals:   04/19/19 1154 04/19/19 1230 04/19/19 1324 04/19/19 1339  BP: 138/90   (!) 141/82  Pulse: (!) 117 (!) 118 (!) 115 (!) 107  Resp: 16   16  Temp: 98.5 F (36.9 C)   98.4 F (36.9 C)  TempSrc: Oral   Oral  SpO2: 100%   100%  Weight:      Height:        Intake/Output Summary (Last 24 hours) at 04/19/2019 1510 Last data filed at 04/19/2019 1248 Gross per 24 hour  Intake 0 ml  Output 680 ml  Net -680 ml   Filed Weights   02/11/19 1441 03/28/19 0306  Weight: 77.1 kg 70.8 kg     Assessment & Plan:   59 year old man with paranoid schizophrenia, bipolar disorder and depression is awaiting placement in a locked memory care unit.  It has been deemed that patient  does not have decisional capacity therefore cannot leave AMA.  Debilitation Encourage patient to be more active and mobile, patient did not seem to be convinced of the necessity of that Will ask nurses to ambulate patient 3 times a day at least to make sure he does not lose core muscle strength.  Depression, anxiety, bipolar disorder and schizophrenia. Frequent suicidal ideation. Patient seems to be almost catatonic, he also seems to be attending elsewhere, possibly having active hallucinations.   Psychiatry consult placed today, texted to Dr. Shelby Dubin to please evaluate patient for catatonia and/or to see if anything can be done about any hallucinations that he may still be having.  Admitted from a group home, was initially [facility] after he wandered off and was suicidal. Does not have decisional capacity, does have a guardian. Presently awaiting placement in a locked memory care unit. Continue Lexapro, risperidone, mirtazapine, trazodone and prazosin Avoid Klonopin due to it causing extreme somnolence  Case management looking for memory care unit.   Patient cannot leave AMA.  Type 2 diabetes mellitus uncontrolled with chronic kidney disease stage IIIa as well as peripheral neuropathy. Diet controlled  BPH Continue Flomax  Dyslipidemia Continue statin  HTN Normotensive off all of his antihypertensives possibly secondary to weight loss.  Dysphagia, dysarthria, left facial droop CVA ruled out with MRI. Currently still has some dysphagia, pockets foods, but takes in fluids ok.   Severe Malnutrition On dietary supplements. From previous note: Related to social /  environmental circumstances(inadequate oral intake)as evidenced by 21.6%weight loss over the past 2-3 months, moderate fat depletion, moderate-severe muscle depletion. Continue Remeron to attempt to enhance appetite Calorie count shows that the patient is able to tolerate 100% of his require calorie due to  increased intake of the nutritional supplementation. Hoping for improvement down the road with the medicines.   DVT prophylaxis: Lovenox Code Status: Full Family Communication: Patient has a legal guardian, no family Disposition Plan: TBD, he is management looking for memory care locked unit.   Consultants:  Psychiatry  Procedures:  None  Antimicrobials:  None   Exam:  General: Awake but apathetic gentleman lying flat in bed in no distress Eyes: sclera anicteric, conjuctiva mild injection bilaterally CVS: S1-S2 no murmur rubs or gallops Respiratory:  normal effort, symmetrical excursion, CTA without adventitious sounds.  GI: NABS, soft, NT, ND, no palpable masses.  LE: No edema. No cyanosis   Data Reviewed: Basic Metabolic Panel: Recent Labs  Lab 04/14/19 0414 04/17/19 0540 04/19/19 1338  NA  --  143 141  K  --  3.5 4.0  CL  --  99 100  CO2  --  33* 34*  GLUCOSE  --  91 142*  BUN  --  39* 40*  CREATININE 1.15 1.00 1.09  CALCIUM  --  9.3 9.6   Liver Function Tests: Recent Labs  Lab 04/19/19 1338  AST 19  ALT 18  ALKPHOS 58  BILITOT 0.6  PROT 7.2  ALBUMIN 4.0   No results for input(s): LIPASE, AMYLASE in the last 168 hours. No results for input(s): AMMONIA in the last 168 hours. CBC: Recent Labs  Lab 04/14/19 0414 04/19/19 1338  WBC 4.9 5.1  HGB 10.7* 12.1*  HCT 30.3* 35.3*  MCV 86.8 88.9  PLT 180 177   Cardiac Enzymes: No results for input(s): CKTOTAL, CKMB, CKMBINDEX, TROPONINI in the last 168 hours. BNP (last 3 results) No results for input(s): PROBNP in the last 8760 hours. CBG: Recent Labs  Lab 04/16/19 1137 04/16/19 1619 04/16/19 2115 04/17/19 0756 04/17/19 1147  GLUCAP 123* 128* 102* 84 95    No results found for this or any previous visit (from the past 240 hour(s)).    Studies: No results found.   Scheduled Meds: . atorvastatin  10 mg Oral Daily  . chlorhexidine  15 mL Mouth/Throat BID  . collagenase   Topical  Daily  . enoxaparin (LOVENOX) injection  40 mg Subcutaneous Q24H  . escitalopram  10 mg Oral Daily  . feeding supplement (NEPRO CARB STEADY)  237 mL Oral TID BM  . feeding supplement (PRO-STAT SUGAR FREE 64)  30 mL Oral BID WC  . gabapentin  300 mg Oral TID  . mirtazapine  15 mg Oral QHS  . multivitamin with minerals  1 tablet Oral Daily  . prazosin  2 mg Oral QHS  . risperiDONE  3 mg Oral BID  . senna-docusate  1 tablet Oral Daily  . tamsulosin  0.4 mg Oral Daily  . traZODone  150 mg Oral QHS   Continuous Infusions:  Principal Problem:   Paranoid schizophrenia ([place]) Active Problems:   Altered mental status   Dementia ([place])   Weakness   Acute metabolic encephalopathy   Protein-calorie malnutrition, severe   Pressure injury of skin     Vashti Hey, MD, FACP, [facility]. [facility]  If 7PM-7AM, please contact night-coverage [email or web address] Password [email or web address] 04/19/2019, 3:10 PM    LOS: 21 days

## 2019-04-19 NOTE — Evaluation (Signed)
Physical Therapy Re-Evaluation Patient Details Name: Peter Parsons MRN: [id number] DOB: 08/06/1960 Today's Date: 04/19/2019   History of Present Illness  Pt is a 59 y.o. male initially presenting to hospital 02/11/20 with IVC from group home d/t wandering off into woods multiple times (found near water); pt reporting suicidal thoughts of drowning himself.  Pt tripped on his own feet and hit chin on chair on morning of 2/4 (pt got back up and walked to restroom independently).  Unable to return to group home d/t safety concerns.  Per chart review pt scored 19/30 on [facility] cognitive assessment with psychiatry indicative of moderate cognitive impairment (consistent with pt's diagnosis of dementia).  Pt was in [place] in ED for about 44 days prior to being admitted to hospital 2/13 for generalized weakness, dysphagia, and slurred speech.  MRI negative for acute CVA.  PMH includes DM, htn, schizophrenia.  Clinical Impression  PT re-evaluation performed d/t extended hospitalization.  Prior to hospital admission, pt was independent with ambulation.  Upon therapist entering pt's room this morning, pt appearing lethargic resting in bed requiring vc's and tactile cues to improve alertness.  Flat affect noted.  Increased time to respond and initiate movement with activities.  Currently pt is min to mod assist semi-supine to sitting edge of bed (assist required to initiate movement d/t pt initially not assisting and then pt started to assist with functional mobility rest of sessions activities); CGA with transfers; and CGA with ambulation 2 laps around nursing loop with RW (400 feet).  Overall pt steady during ambulation but pt did have 1 posterior lean (requiring min assist to shift weight forward) when standing adjusting his face mask.  Minimal verbalizations noted during session; decreased mood noted in general.  Pt would benefit from skilled PT to address noted impairments and functional limitations (see below for any  additional details).  Upon hospital discharge, pt would benefit from 24/7 assist with functional mobility for safety and HHPT.    Follow Up Recommendations Home health PT;Supervision/Assistance - 24 hour    Equipment Recommendations  Rolling walker with 5" wheels;3in1 (PT)    Recommendations for Other Services       Precautions / Restrictions Precautions Precautions: Fall Precaution Comments: Suicide precautions; aspiration precautions Restrictions Weight Bearing Restrictions: No      Mobility  Bed Mobility Overal bed mobility: Needs Assistance Bed Mobility: Supine to Sit     Supine to sit: Min assist;Mod assist     General bed mobility comments: assist to initiate movement of B LE's and trunk to sit up on edge of bed  Transfers Overall transfer level: Needs assistance Equipment used: Rolling walker (2 wheeled) Transfers: Sit to/from Stand Sit to Stand: Min guard         General transfer comment: fairly strong stand from bed; vc's and tactile cues for UE/LE placement  Ambulation/Gait Ambulation/Gait assistance: Min guard Gait Distance (Feet): 400 Feet Assistive device: Rolling walker (2 wheeled)   Gait velocity: decreased   General Gait Details: initially shuffling type gait but improved to partial step through gait pattern with vc's for increasing step length; very occasional assist to navigate walker around obstacles  Stairs            Wheelchair Mobility    Modified Rankin (Stroke Patients Only)       Balance Overall balance assessment: Needs assistance Sitting-balance support: No upper extremity supported;Feet supported Sitting balance-Leahy Scale: Good Sitting balance - Comments: steady sitting reaching within BOS Postural control: Posterior lean Standing  balance support: No upper extremity supported Standing balance-Leahy Scale: Poor Standing balance comment: pt with posterior lean when standing adjusting his face mask requiring min assist  to shift weight forward                             Pertinent Vitals/Pain Pain Assessment: Faces Faces Pain Scale: No hurt Pain Intervention(s): Limited activity within patient's tolerance;Monitored during session;Repositioned  Vitals (HR and O2 on room air) stable and WFL throughout treatment session.    Home Living Family/patient expects to be discharged to:: Group home                      Prior Function Level of Independence: Independent         Comments: Ambulatory no AD     Hand Dominance        Extremity/Trunk Assessment   Upper Extremity Assessment Upper Extremity Assessment: Generalized weakness    Lower Extremity Assessment Lower Extremity Assessment: Generalized weakness    Cervical / Trunk Assessment Cervical / Trunk Assessment: Normal  Communication      Cognition Arousal/Alertness: Awake/alert Behavior During Therapy: Flat affect Overall Cognitive Status: Within Functional Limits for tasks assessed                                 General Comments: increased process/response time      General Comments   Nursing cleared pt for participation in physical therapy.  Pt agreeable to PT session with encouragement.    Exercises     Assessment/Plan    PT Assessment Patient needs continued PT services  PT Problem List Decreased strength;Decreased activity tolerance;Decreased balance;Decreased mobility;Decreased knowledge of use of DME;Decreased knowledge of precautions;Decreased skin integrity       PT Treatment Interventions DME instruction;Gait training;Stair training;Functional mobility training;Therapeutic activities;Therapeutic exercise;Balance training;Patient/family education    PT Goals (Current goals can be found in the Care Plan section)  Acute Rehab PT Goals Patient Stated Goal: to improve strength/balance PT Goal Formulation: With patient Time For Goal Achievement: 05/03/19 Potential to Achieve Goals:  Good    Frequency Min 2X/week   Barriers to discharge Decreased caregiver support      Co-evaluation               AM-PAC PT "6 Clicks" Mobility  Outcome Measure Help needed turning from your back to your side while in a flat bed without using bedrails?: A Little Help needed moving from lying on your back to sitting on the side of a flat bed without using bedrails?: A Lot Help needed moving to and from a bed to a chair (including a wheelchair)?: A Little Help needed standing up from a chair using your arms (e.g., wheelchair or bedside chair)?: A Little Help needed to walk in hospital room?: A Little Help needed climbing 3-5 steps with a railing? : A Little 6 Click Score: 17    End of Session Equipment Utilized During Treatment: Gait belt Activity Tolerance: Patient tolerated treatment well Patient left: in chair;with call bell/phone within reach;with chair alarm set;Other (comment)(B heels floating via pillow) Nurse Communication: Mobility status;Precautions PT Visit Diagnosis: Other abnormalities of gait and mobility (R26.89);Muscle weakness (generalized) (M62.81);History of falling (Z91.81);Difficulty in walking, not elsewhere classified (R26.2)    Time: [id number] PT Time Calculation (min) (ACUTE ONLY): 29 min   Charges:   PT Evaluation $PT Re-evaluation:  1 Re-eval PT Treatments $Gait Training: 8-22 mins $Therapeutic Exercise: 8-22 mins       Leitha Bleak, PT 04/19/19, 11:47 AM

## 2019-04-19 NOTE — Progress Notes (Signed)
Occupational Therapy Treatment Patient Details Name: Peter Parsons MRN: [id number] DOB: 1960-12-15 Today's Date: 04/19/2019    History of present illness Pt is a 59 y.o. male initially presenting to hospital 02/11/20 with IVC from group home d/t wandering off into woods multiple times (found near water); pt reporting suicidal thoughts of drowning himself.  Pt tripped on his own feet and hit chin on chair on morning of 2/4 (pt got back up and walked to restroom independently).  Unable to return to group home d/t safety concerns.  Per chart review pt scored 19/30 on [facility] cognitive assessment with psychiatry indicative of moderate cognitive impairment (consistent with pt's diagnosis of dementia).  Pt was in [place] in ED for about 44 days prior to being admitted to hospital 2/13 for generalized weakness, dysphagia, and slurred speech.  MRI negative for acute CVA.  PMH includes DM, htn, schizophrenia.   OT comments  Pt seen for OT tx this date to f/u re: safety with self care ADLs/ADL mobility. Pt sitting in chair when OT arrives. OT encourages pt to participate in OOB activity and pt requires gentle re-direction throughout session (see cognitive section). Pt participates in one sit to stand with CGA with MIN verbal cues to attempt to distribute weight evenly in feet to create more secure base of support (pt noted to have posterior lean/weight in heels in static standing). OT encourages pt to sit deeper into chair on stand to sit to anchor hips more in seat to optimize position and safety in prep for meal time setup. OT engages pt in seated grooming tasks with setup to wash face and hands in prep for lunch time and pt requires MIN verbal/tactile cues to initiate tasks as well as some cueing for sequence/thoroguh completion. Pt left sitting in chair with chair alarm set and BST setup with lunch. Anticipate HHOT is still most appropriate d/c recommendation given pt's status with completion of ADLs and pt's balance  at this time.   Follow Up Recommendations  Home health OT;Supervision/Assistance - 24 hour    Equipment Recommendations  3 in 1 bedside commode;Other (comment)    Recommendations for Other Services      Precautions / Restrictions Precautions Precautions: Fall Precaution Comments: Suicide precautions; aspiration precautions Restrictions Weight Bearing Restrictions: No       Mobility Bed Mobility         General bed mobility comments: pt up to chair when OT presents  Transfers Overall transfer level: Needs assistance Equipment used: none Transfers: Sit to/from Stand Sit to Stand: Min guard Stand pivot transfers: Min guard       General transfer comment:     Balance Overall balance assessment: Needs assistance Sitting-balance support: No upper extremity supported;Feet supported Sitting balance-Leahy Scale: Good Sitting balance - Comments: steady sitting reaching within BOS Postural control: Posterior lean Standing balance support: No upper extremity supported Standing balance-Leahy Scale: Fair Standing balance comment: fair in static standing, OT attempts to encourage pt to distribute weight more evenly in feet, noted to put weight in heels in standing                           ADL either performed or assessed with clinical judgement   ADL Overall ADL's : Needs assistance/impaired     Grooming: Wash/dry hands;Wash/dry face;Set up;Sitting Grooming Details (indicate cue type and reason): Still requires cues to initiate, OT attempts to grade task up with only visual cues, but pt ultimately requires  MIN verbal/tactile to initiate washin face and hands in prep for eating lunch from meal tray.                                     Vision Patient Visual Report: No change from baseline     Perception     Praxis      Cognition Arousal/Alertness: Awake/alert Behavior During Therapy: Flat affect Overall Cognitive Status: Within  Functional Limits for tasks assessed                                 General Comments: increased process/response time, OT provides gentle re-direction this date for focus on fxl tasks as pt perseverates somewhat stating 5 times at random intervals during session, "I've died and I'm already in hell".        Exercises Other Exercises Other Exercises: OT facilitates education re: purpose of OOB activity/rationale as pt asks "why are we doing this?"   Shoulder Instructions       General Comments      Pertinent Vitals/ Pain       Pain Assessment: Faces Faces Pain Scale: No hurt Pain Intervention(s): Limited activity within patient's tolerance;Monitored during session;Repositioned  Home Living Family/patient expects to be discharged to:: Group home                                        Prior Functioning/Environment Level of Independence: Independent        Comments: Ambulatory no AD   Frequency  Min 2X/week        Progress Toward Goals  OT Goals(current goals can now be found in the care plan section)  Progress towards OT goals: Progressing toward goals  Acute Rehab OT Goals Patient Stated Goal: to improve strength/balance OT Goal Formulation: With patient Time For Goal Achievement: 04/28/19 Potential to Achieve Goals: Good  Plan Discharge plan remains appropriate;Frequency needs to be updated    Co-evaluation                 AM-PAC OT "6 Clicks" Daily Activity     Outcome Measure   Help from another person eating meals?: None Help from another person taking care of personal grooming?: A Lot Help from another person toileting, which includes using toliet, bedpan, or urinal?: A Little Help from another person bathing (including washing, rinsing, drying)?: A Lot Help from another person to put on and taking off regular upper body clothing?: A Little Help from another person to put on and taking off regular lower body  clothing?: A Little 6 Click Score: 17    End of Session Equipment Utilized During Treatment: Gait belt  OT Visit Diagnosis: Unsteadiness on feet (R26.81);Muscle weakness (generalized) (M62.81)   Activity Tolerance Patient tolerated treatment well;No increased pain   Patient Left with call bell/phone within reach;in chair;with chair alarm set   Nurse Communication Other (comment)(notified nurse of pt's statements and that OT removed fork/knife from pt's lunch tray for safety.)        Time: [id number] OT Time Calculation (min): 25 min  Charges: OT General Charges $OT Visit: 1 Visit OT Treatments $Self Care/Home Management : 8-22 mins $Therapeutic Activity: 8-22 mins  Gerrianne Scale, MS, OTR/L ascom [phone] 04/19/19, 1:40 PM

## 2019-04-19 NOTE — TOC Progression Note (Addendum)
Transition of Care [facility]) - Progression Note    Patient Details  Name: Peter Parsons MRN: [id number] Date of Birth: 06/13/1960  Transition of Care [facility]) CM/SW [place], LCSW Phone Number: 04/19/2019, 10:33 AM  Clinical Narrative: Faxed referral to [facility] [place]) and [facility] [place]). Both confirmed they have locked memory care units. [facility] has a wait list but CSW asked admissions coordinator to still review referral to see if patient is appropriate for their facility. Left voicemails for admissions coordinators at [place] ([facility]), [place] ([place]), and [facility]) to see if they had locked memory care units. Call wouldn't go through at [facility]. No answer at [place] at [employer] [facility]). No beds available at [facility]).    1:39 pm: [facility] is full.  3:00 pm: [facility]) is full. [place] at [facility]) is a closed community. Left voicemail for admissions coordinators at [facility] and [place] ([facility]) and [place] ([place]).  Expected Discharge Plan: Group Home Barriers to Discharge: SNF Pending bed offer  Expected Discharge Plan and Services Expected Discharge Plan: Group Home   Discharge Planning Services: CM Consult   Living arrangements for the past 2 months: Group Home                 DME Arranged: N/A DME Agency: [employer] Date DME Agency Contacted: 04/12/19 Time DME Agency Contacted: [phone] Representative spoke with at DME Agency: none needed [place] Arranged: NA           Social Determinants of Health (SDOH) Interventions    Readmission Risk Interventions No flowsheet data found.

## 2019-04-19 NOTE — Consult Note (Signed)
[facility] Face-to-Face Psychiatry Consult   Reason for Consult: Consult for this 59 year old man with schizophrenia who has been in the hospital for a remarkably long time at this point. Referring Physician:  [place] Patient Identification: Peter Parsons MRN:  [id number] Principal Diagnosis: Paranoid schizophrenia ([place]) Diagnosis:  Principal Problem:   Paranoid schizophrenia ([place]) Active Problems:   Altered mental status   Dementia ([place])   Weakness   Acute metabolic encephalopathy   Protein-calorie malnutrition, severe   Pressure injury of skin   Total Time spent with patient: 1 hour  Subjective:   Peter Parsons is a 59 y.o. male patient admitted with "I do not feel good".  HPI: Patient seen chart reviewed.  Patient known to me from previous encounters.  59 year old man with a history of schizophrenia who has been in the hospital now with altered mental status for several weeks.  Consult to reassess psychiatric treatment.  I found the patient awake interactive verbal.  As has been typical for him in the past his affect is pretty flat and he does not talk a great deal but he answered questions appropriately.  Patient tells me that he wishes he could go back home to live with his parents.  In fact he tells me that he is now convinced and believes he will be able to do this even though that has been off the table for quite a while.  Patient denies having any hallucinations.  He says his mood feels bad.  Denies however any thoughts about actively trying to harm himself.  I spoke with his nurses who report that he has been increasingly active been getting out of bed walking around the unit today eating better.  Past Psychiatric History: Patient has a long history of schizophrenia and for many many years lived with his parents.  They are now getting to be too elderly to take care of him of course which has resulted in a crisis as he has moved into group homes.  He has a difficult time adapting to  that.  Risk to Self: Suicidal Ideation: Yes-Currently Present Suicidal Intent: No Is patient at risk for suicide?: No, but patient needs Medical Clearance Suicidal Plan?: Yes-Currently Present Specify Current Suicidal Plan: Drown his self Access to Means: No Specify Access to Suicidal Means: Drown self What has been your use of drugs/alcohol within the last 12 months?: Reports of none How many times?: 1 Other Self Harm Risks: Reports of none Triggers for Past Attempts: Other (Comment)(Mental Illness) Intentional Self Injurious Behavior: None Risk to Others: Homicidal Ideation: No Thoughts of Harm to Others: No Current Homicidal Intent: No Current Homicidal Plan: No Access to Homicidal Means: No Identified Victim: Reports of none History of harm to others?: No Assessment of Violence: None Noted Violent Behavior Description: Reports of none Does patient have access to weapons?: No Criminal Charges Pending?: No Describe Pending Criminal Charges: Reports of none Does patient have a court date: No Prior Inpatient Therapy: Prior Inpatient Therapy: Yes Prior Therapy Dates: 01/2019, 12/2018 & 05/2018 Prior Therapy Facilty/Provider(s): [facility] [place] [facility] Reason for Treatment: Schizophrenia Prior Outpatient Therapy: Prior Outpatient Therapy: Yes Prior Therapy Dates: Current Prior Therapy Facilty/Provider(s): [facility] Reason for Treatment: Schizophrenia([facility]) Does patient have an ACCT team?: Yes Does patient have Intensive In-House Services?  : No Does patient have [facility] services? : No Does patient have P4CC services?: No  Past Medical History:  Past Medical History:  Diagnosis Date  . Diabetes mellitus  without complication ([place])   . Hypertension   . Schizophrenia ([place])    History reviewed. No pertinent surgical history. Family History: History reviewed. No pertinent family history. Family Psychiatric  History: See  previous Social History:  Social History   Substance and Sexual Activity  Alcohol Use Not Currently     Social History   Substance and Sexual Activity  Drug Use Not Currently    Social History   Socioeconomic History  . Marital status: Single    Spouse name: Not on file  . Number of children: Not on file  . Years of education: Not on file  . Highest education level: Not on file  Occupational History  . Not on file  Tobacco Use  . Smoking status: Former Research scientist (life sciences)  . Smokeless tobacco: Never Used  Substance and Sexual Activity  . Alcohol use: Not Currently  . Drug use: Not Currently  . Sexual activity: Not on file  Other Topics Concern  . Not on file  Social History Narrative  . Not on file   Social Determinants of Health   Financial Resource Strain:   . Difficulty of Paying Living Expenses: Not on file  Food Insecurity:   . Worried About Charity fundraiser in the Last Year: Not on file  . Ran Out of Food in the Last Year: Not on file  Transportation Needs:   . Lack of Transportation (Medical): Not on file  . Lack of Transportation (Non-Medical): Not on file  Physical Activity:   . Days of Exercise per Week: Not on file  . Minutes of Exercise per Session: Not on file  Stress:   . Feeling of Stress : Not on file  Social Connections:   . Frequency of Communication with Friends and Family: Not on file  . Frequency of Social Gatherings with Friends and Family: Not on file  . Attends Religious Services: Not on file  . Active Member of Clubs or Organizations: Not on file  . Attends Archivist Meetings: Not on file  . Marital Status: Not on file   Additional Social History:    Allergies:   Allergies  Allergen Reactions  . Acetaminophen Other (See Comments)    Unknown Unable to breathe Chest  tightness/SOB     Labs:  Results for orders placed or performed during the hospital encounter of 02/11/19 (from the past 48 hour(s))  CBC     Status:  Abnormal   Collection Time: 04/19/19  1:38 PM  Result Value Ref Range   WBC 5.1 4.0 - 10.5 K/uL   RBC 3.97 (L) 4.22 - 5.81 MIL/uL   Hemoglobin 12.1 (L) 13.0 - 17.0 g/dL   HCT 35.3 (L) 39.0 - 52.0 %   MCV 88.9 80.0 - 100.0 fL   MCH 30.5 26.0 - 34.0 pg   MCHC 34.3 30.0 - 36.0 g/dL   RDW 14.5 11.5 - 15.5 %   Platelets 177 150 - 400 K/uL   nRBC 0.0 0.0 - 0.2 %    Comment: Performed at [facility], [address] [place], [place] [address]  Comprehensive metabolic panel     Status: Abnormal   Collection Time: 04/19/19  1:38 PM  Result Value Ref Range   Sodium 141 135 - 145 mmol/L   Potassium 4.0 3.5 - 5.1 mmol/L   Chloride 100 98 - 111 mmol/L   CO2 34 (H) 22 - 32 mmol/L   Glucose, Bld 142 (H) 70 - 99 mg/dL  Comment: Glucose reference range applies only to samples taken after fasting for at least 8 hours.   BUN 40 (H) 6 - 20 mg/dL   Creatinine, Ser 1.09 0.61 - 1.24 mg/dL   Calcium 9.6 8.9 - 10.3 mg/dL   Total Protein 7.2 6.5 - 8.1 g/dL   Albumin 4.0 3.5 - 5.0 g/dL   AST 19 15 - 41 U/L   ALT 18 0 - 44 U/L   Alkaline Phosphatase 58 38 - 126 U/L   Total Bilirubin 0.6 0.3 - 1.2 mg/dL   GFR calc non Af Amer >60 >60 mL/min   GFR calc Af Amer >60 >60 mL/min   Anion gap 7 5 - 15    Comment: Performed at [facility], [address], [place], [place] [address]    Current Facility-Administered Medications  Medication Dose Route Frequency Provider Last Rate Last Admin  . atorvastatin (LIPITOR) tablet 10 mg  10 mg Oral Daily Carrie Mew, MD   10 mg at 04/19/19 0943  . chlorhexidine (PERIDEX) 0.12 % solution 15 mL  15 mL Mouth/Throat BID Lavina Hamman, MD   15 mL at 04/19/19 0943  . clonazePAM (KLONOPIN) tablet 0.25 mg  0.25 mg Oral TID PRN Dixie Dials, MD   0.25 mg at 04/02/19 1621  . collagenase (SANTYL) ointment   Topical Daily Vashti Hey, MD   Given at 04/19/19 [phone]  . dextrose 50 % solution 50 mL  1 ampule Intravenous PRN Lavina Hamman, MD      . enoxaparin (LOVENOX) injection 40 mg  40 mg Subcutaneous Q24H Lavina Hamman, MD   40 mg at 04/19/19 0707  . escitalopram (LEXAPRO) tablet 10 mg  10 mg Oral Daily Cristofano, Dorene Ar, MD   10 mg at 04/19/19 0944  . feeding supplement (NEPRO CARB STEADY) liquid 237 mL  237 mL Oral TID BM Enzo Bi, MD   237 mL at 04/19/19 1338  . feeding supplement (PRO-STAT SUGAR FREE 64) liquid 30 mL  30 mL Oral BID WC Enzo Bi, MD   30 mL at 04/19/19 1553  . gabapentin (NEURONTIN) capsule 300 mg  300 mg Oral TID Carrie Mew, MD   300 mg at 04/19/19 1551  . mirtazapine (REMERON) tablet 45 mg  45 mg Oral QHS Joell Usman T, MD      . multivitamin with minerals tablet 1 tablet  1 tablet Oral Daily Cristofano, Dorene Ar, MD   1 tablet at 04/19/19 0943  . paliperidone (INVEGA SUSTENNA) injection 234 mg  234 mg Intramuscular Once Elihu Milstein T, MD      . risperiDONE (RISPERDAL) tablet 3 mg  3 mg Oral BID Carrie Mew, MD   3 mg at 04/19/19 0944  . senna-docusate (Senokot-S) tablet 1 tablet  1 tablet Oral QHS PRN Mansy, Arvella Merles, MD   1 tablet at 04/15/19 2229  . senna-docusate (Senokot-S) tablet 1 tablet  1 tablet Oral Daily Enzo Bi, MD   1 tablet at 04/19/19 [phone]  . tamsulosin (FLOMAX) capsule 0.4 mg  0.4 mg Oral Daily Carrie Mew, MD   0.4 mg at 04/19/19 0943  . traZODone (DESYREL) tablet 150 mg  150 mg Oral QHS Carrie Mew, MD   150 mg at 04/18/19 2350    Musculoskeletal: Strength & Muscle Tone: decreased Gait & Station: normal Patient leans: N/A  Psychiatric Specialty Exam: Physical Exam  Nursing note and vitals reviewed. Constitutional: He appears well-developed and well-nourished.  HENT:  Head:  Normocephalic and atraumatic.  Eyes: Pupils are equal, round, and reactive to light. Conjunctivae are normal.  Cardiovascular: Regular rhythm and normal heart sounds.  Respiratory: Effort normal.  GI: Soft.  Musculoskeletal:        General: Normal range of motion.      Cervical back: Normal range of motion.  Neurological: He is alert.  Skin: Skin is warm and dry.  Psychiatric: His affect is blunt. His speech is delayed. He is slowed and withdrawn. Cognition and memory are impaired. He expresses impulsivity and inappropriate judgment. He exhibits a depressed mood. He expresses no homicidal and no suicidal ideation.    Review of Systems  Constitutional: Negative.   HENT: Negative.   Eyes: Negative.   Respiratory: Negative.   Cardiovascular: Negative.   Gastrointestinal: Negative.   Musculoskeletal: Negative.   Skin: Negative.   Neurological: Negative.   Psychiatric/Behavioral: Positive for dysphoric mood.    Blood pressure 107/64, pulse 68, temperature 99.3 F (37.4 C), temperature source Oral, resp. rate 17, height 5\' 9"  (1.753 m), weight 70.8 kg, SpO2 99 %.Body mass index is 23.05 kg/m.  General Appearance: Casual  Eye Contact:  Fair  Speech:  Slow  Volume:  Decreased  Mood:  Dysphoric  Affect:  Constricted  Thought Process:  Coherent  Orientation:  Full (Time, Place, and Person)  Thought Content:  Rumination  Suicidal Thoughts:  No  Homicidal Thoughts:  No  Memory:  Immediate;   Fair Recent;   Fair Remote;   Fair  Judgement:  Impaired  Insight:  Shallow  Psychomotor Activity:  Decreased  Concentration:  Concentration: Poor  Recall:  [employer] of Knowledge:  Fair  Language:  Fair  Akathisia:  No  Handed:  Right  AIMS (if indicated):     Assets:  Desire for Improvement Housing Resilience  ADL's:  Impaired  Cognition:  Impaired,  Mild  Sleep:        Treatment Plan Summary: Daily contact with patient to assess and evaluate symptoms and progress in treatment, Medication management and Plan 59 year old man with schizophrenia and depression.  I think that he is probably not as demented as people might think and that most of his symptoms are the result of his mental illness.  I reviewed his medication right now.  He is on only 15  mg of mirtazapine which is an inadequate dose to treat depression.  I am going to go ahead and bump that up to 45.  He is currently getting oral Risperdal.  When he was last in the hospital and doing well we had him on long-acting Invega and I will go ahead and order the long-acting injection tonight.  Encourage patient and his attempts to get up out of bed and be more active on the unit.  We will continue to follow up with him.  Disposition: Supportive therapy provided about ongoing stressors. Discussed crisis plan, support from social network, calling 911, coming to the Emergency Department, and calling Suicide Hotline.  Alethia Berthold, MD 04/19/2019 8:05 PM

## 2019-04-20 DIAGNOSIS — F200 Paranoid schizophrenia: Secondary | ICD-10-CM | POA: Diagnosis not present

## 2019-04-20 DIAGNOSIS — R531 Weakness: Secondary | ICD-10-CM | POA: Diagnosis not present

## 2019-04-20 NOTE — TOC Progression Note (Signed)
Transition of Care [facility]) - Progression Note    Patient Details  Name: Peter Parsons MRN: [id number] Date of Birth: Sep 27, 1960  Transition of Care [facility]) CM/SW Contact  Shelbie Ammons, RN Phone Number: 04/20/2019, 12:54 PM  Clinical Narrative:    9:30am- RNCM placed call to [name] and left VM for return call. Placed call to [place] at [facility] in [place] to follow up on additional information that was faxed down on Friday. Renee reports that she didn't receive fax and requests that information be re-sent. Updated progress note, wound note and Psych consult note faxed to [phone].   10:45am- Received return call from [facility] [place] patient's quardian to discuss case. Reported to [name] that discussion had been had with attending MD and Psychiatry and they both think that patient would benefit more from being in a group home situation but with more supervision due to his tendency to wander. [name] requested that contact be make with Adia his care co-ordinator with [facility]. Placed call to Adia and left VM for return call.   12:15pm- Received return call from [place], explained situation and gave update on new recommendations given by MDs. Adia reports that she will work along side patient's ACT team and they will look for placement in a group home situation. She does however report that at times this can be a difficult placement due to lack of available beds. She reported to this RNCM that she will update in a couple of days with who they have contacted and give any updates.    Expected Discharge Plan: Group Home Barriers to Discharge: SNF Pending bed offer  Expected Discharge Plan and Services Expected Discharge Plan: Group Home   Discharge Planning Services: CM Consult   Living arrangements for the past 2 months: Group Home                 DME Arranged: N/A DME Agency: [employer] Date DME Agency Contacted: 04/12/19 Time DME Agency Contacted: [phone] Representative spoke with at DME  Agency: none needed [place] Arranged: NA           Social Determinants of Health (SDOH) Interventions    Readmission Risk Interventions No flowsheet data found.
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
## 2019-04-21 DIAGNOSIS — F200 Paranoid schizophrenia: Secondary | ICD-10-CM | POA: Diagnosis not present

## 2019-04-21 NOTE — Plan of Care (Signed)
  Problem: Education: Goal: Knowledge of General Education information will improve Description: Including pain rating scale, medication(s)/side effects and non-pharmacologic comfort measures Outcome: Progressing   Problem: Activity: Goal: Risk for activity intolerance will decrease Outcome: Progressing Note: Patient ambulated on unit with one assist and walker   Problem: Safety: Goal: Ability to remain free from injury will improve Outcome: Progressing   Problem: Skin Integrity: Goal: Risk for impaired skin integrity will decrease Outcome: Progressing

## 2019-04-21 NOTE — Progress Notes (Signed)
PROGRESS NOTE    Peter Parsons  [id number] DOB: 1961/02/07 DOA: 02/11/2019 PCP: System, Provider Not In     Assessment & Plan:   Principal Problem:   Paranoid schizophrenia ([place]) Active Problems:   Altered mental status   Dementia ([place])   Weakness   Acute metabolic encephalopathy   Protein-calorie malnutrition, severe   Pressure injury of skin  Debilitation: PT recs home health w/ 24 hr supervision/assistance  Schizophrenia: w/ frequent suicidal ideation. Continue on paliperidone, klonopin, mirtazapine, risperidone. Does not have decisional capacity, does have a guardian. Psychiatry following and recs apprec   Depression/Bipolar disorder: unknown type and/or severity. Continue on lexapro, trazodone.  Psychiatry following and recs apprec  DM2: uncontrolled w/ chronic kidney disease stage IIIa. Will continue to hold home dose of metformin  Peripheral neuropathy: continue on gabapentin  BPH: continue Flomax  Dyslipidemia: continue statin  HTN: normotensive off all of his antihypertensives possibly secondary to weight loss.  Dysphagia, dysarthria, left facial droop: CVA ruled out with MRI. Currently still has some dysphagia, pockets foods, but takes in fluids ok.   Severe Malnutrition: continue on dietary supplements. Continue on mirtazapine    DVT prophylaxis: lovenox  Code Status: full  Family Communication:  Disposition Plan: CM is working w/ legal guardian to have pt placed in a group home    Consultants:   Psychiatry    Procedures:    Antimicrobials:    Subjective: Pt would not answer any of my questions today   Objective: Vitals:   04/20/19 1928 04/20/19 2350 04/21/19 0342 04/21/19 0746  BP: 127/81 125/86 110/75 108/69  Pulse: (!) 110 98 66 64  Resp: 16  16 18   Temp: (!) 97.3 F (36.3 C) 98.5 F (36.9 C) 97.6 F (36.4 C) 98.3 F (36.8 C)  TempSrc:  Oral  Oral  SpO2: 99% 99% 97% 97%  Weight:      Height:        Intake/Output  Summary (Last 24 hours) at 04/21/2019 0754 Last data filed at 04/20/2019 1831 Gross per 24 hour  Intake 440 ml  Output --  Net 440 ml   Filed Weights   02/11/19 1441 03/28/19 0306  Weight: 77.1 kg 70.8 kg    Examination:  General exam: Appears calm and comfortable  Respiratory system: Clear to auscultation. No rales, rhonchi Cardiovascular system: S1 & S2 +. No rubs, gallops or clicks.  Gastrointestinal system: Abdomen is nondistended, soft and nontender. Hypoactive bowel sounds heard. Central nervous system: Alert and awake. Moves all 4 extremities Psychiatry: Judgement and insight appear abnormal. Flat mood and affect    Data Reviewed: I have personally reviewed following labs and imaging studies  CBC: Recent Labs  Lab 04/19/19 1338  WBC 5.1  HGB 12.1*  HCT 35.3*  MCV 88.9  PLT [id number]   Basic Metabolic Panel: Recent Labs  Lab 04/17/19 0540 04/19/19 1338  NA 143 141  K 3.5 4.0  CL 99 100  CO2 33* 34*  GLUCOSE 91 142*  BUN 39* 40*  CREATININE 1.00 1.09  CALCIUM 9.3 9.6   GFR: Estimated Creatinine Clearance: 72.1 mL/min (by C-G formula based on SCr of 1.09 mg/dL). Liver Function Tests: Recent Labs  Lab 04/19/19 1338  AST 19  ALT 18  ALKPHOS 58  BILITOT 0.6  PROT 7.2  ALBUMIN 4.0   No results for input(s): LIPASE, AMYLASE in the last 168 hours. No results for input(s): AMMONIA in the last 168 hours. Coagulation Profile: No results for input(s): INR, PROTIME  in the last 168 hours. Cardiac Enzymes: No results for input(s): CKTOTAL, CKMB, CKMBINDEX, TROPONINI in the last 168 hours. BNP (last 3 results) No results for input(s): PROBNP in the last 8760 hours. HbA1C: No results for input(s): HGBA1C in the last 72 hours. CBG: Recent Labs  Lab 04/16/19 1137 04/16/19 1619 04/16/19 2115 04/17/19 0756 04/17/19 1147  GLUCAP 123* 128* 102* 84 95   Lipid Profile: No results for input(s): CHOL, HDL, LDLCALC, TRIG, CHOLHDL, LDLDIRECT in the last 72  hours. Thyroid Function Tests: No results for input(s): TSH, T4TOTAL, FREET4, T3FREE, THYROIDAB in the last 72 hours. Anemia Panel: No results for input(s): VITAMINB12, FOLATE, FERRITIN, TIBC, IRON, RETICCTPCT in the last 72 hours. Sepsis Labs: No results for input(s): PROCALCITON, LATICACIDVEN in the last 168 hours.  No results found for this or any previous visit (from the past 240 hour(s)).       Radiology Studies: No results found.      Scheduled Meds: . atorvastatin  10 mg Oral Daily  . chlorhexidine  15 mL Mouth/Throat BID  . collagenase   Topical Daily  . enoxaparin (LOVENOX) injection  40 mg Subcutaneous Q24H  . escitalopram  10 mg Oral Daily  . feeding supplement (NEPRO CARB STEADY)  237 mL Oral TID BM  . feeding supplement (PRO-STAT SUGAR FREE 64)  30 mL Oral BID WC  . gabapentin  300 mg Oral TID  . mirtazapine  45 mg Oral QHS  . multivitamin with minerals  1 tablet Oral Daily  . risperiDONE  3 mg Oral BID  . senna-docusate  1 tablet Oral Daily  . tamsulosin  0.4 mg Oral Daily  . traZODone  150 mg Oral QHS   Continuous Infusions:   LOS: 23 days    Time spent: 30 mins   Wyvonnia Dusky, MD [facility] Pager [phone]  If 7PM-7AM, please contact night-coverage [email or web address] 04/21/2019, 7:54 AM

## 2019-04-21 NOTE — Progress Notes (Signed)
Occupational Therapy Treatment Patient Details Name: Peter Parsons MRN: [id number] DOB: 31-Oct-1960 Today's Date: 04/21/2019    History of present illness Pt is a 59 y.o. male initially presenting to hospital 02/11/20 with IVC from group home d/t wandering off into woods multiple times (found near water); pt reporting suicidal thoughts of drowning himself.  Pt tripped on his own feet and hit chin on chair on morning of 2/4 (pt got back up and walked to restroom independently).  Unable to return to group home d/t safety concerns.  Per chart review pt scored 19/30 on [facility] cognitive assessment with psychiatry indicative of moderate cognitive impairment (consistent with pt's diagnosis of dementia).  Pt was in [place] in ED for about 44 days prior to being admitted to hospital 2/13 for generalized weakness, dysphagia, and slurred speech.  MRI negative for acute CVA.  PMH includes DM, htn, schizophrenia.   OT comments  Pt seen for OT tx session focused on bathing.  OTR facilitated pt shower this date, providing overall mod assist in bathing.  Peter Parsons presents with slightly improved social participation and initiation, although still requires significant verbal and tactile cues to initiate most functional tasks.  OTR provided min guard in functional transfers and ambulation to bed, toilet, and shower.  Pt showered seated, requiring mod assist to reach his back and initiate washing various parts of his body.  Pt is slightly impulsive as he attempted x2 to stand while toileting after verbal commands to stay seated until OTR able to assist.  Peter Parsons reported feeling better after getting clean in the shower.  Pt had heart rate in 90s with ambulation, but quickly decreased with rest.  Pt will continue to benefit from skilled OT services to optimize safety and independence in ADLs, home health OT with 24 hour supervision remains most appropriate discharge recommendation at this time.    Follow Up Recommendations  Home  health OT;Supervision/Assistance - 24 hour    Equipment Recommendations  3 in 1 bedside commode;Other (comment)    Recommendations for Other Services      Precautions / Restrictions Precautions Precautions: Fall Precaution Comments: Suicide precautions; aspiration precautions; monitor HR Restrictions Weight Bearing Restrictions: No       Mobility Bed Mobility Overal bed mobility: Needs Assistance Bed Mobility: Supine to Sit     Supine to sit: Min assist Sit to supine: Mod assist   General bed mobility comments: Pt requires verbal and tactile cues to initiate, OTR assisted with BLE in sit > supine  Transfers Overall transfer level: Needs assistance Equipment used: Rolling walker (2 wheeled) Transfers: Sit to/from Stand Sit to Stand: Min guard Stand pivot transfers: Min guard       General transfer comment: Min guard needed in functional transfers and mobility, pt with slight posterior lean (although improved from previous sessions)    Balance Overall balance assessment: Needs assistance Sitting-balance support: No upper extremity supported;Feet supported Sitting balance-Leahy Scale: Fair Sitting balance - Comments: requires cues for optimal positioning and balance Postural control: Posterior lean Standing balance support: Single extremity supported Standing balance-Leahy Scale: Fair Standing balance comment: one LOB in shower when pt attempted to stand without any use of grab bars.  OTR able to recover and pt stands with use of grab bars and min guard                           ADL either performed or assessed with clinical judgement   ADL Overall ADL's :  Needs assistance/impaired Eating/Feeding: Modified independent Eating/Feeding Details (indicate cue type and reason): Extra time Grooming: Wash/dry hands;Wash/dry face;Set up;Sitting Grooming Details (indicate cue type and reason): Requires verbal and tactile cues to initiate washing face, combing  hair Upper Body Bathing: Moderate assistance Upper Body Bathing Details (indicate cue type and reason): Mod assist needed to reach and wash back, verbal and tactile cues to initiate washing hair, supervision needed for safety Lower Body Bathing: Minimal assistance Lower Body Bathing Details (indicate cue type and reason): Verbal and tactile cues needed to initiate washing all parts of body, pt able to reach all areas Upper Body Dressing : Set up Upper Body Dressing Details (indicate cue type and reason): verbal and tactile cues needed to initiate Lower Body Dressing: Moderate assistance Lower Body Dressing Details (indicate cue type and reason): Mod assist needed to don/doff socks Toilet Transfer: Minimal assistance;Grab bars;RW;Regular Glass blower/designer Details (indicate cue type and reason): Pt impulsive and stands after verbal commands from OTR to ask for assistance when finished Toileting- Clothing Manipulation and Hygiene: Min guard;Sitting/lateral lean Toileting - Clothing Manipulation Details (indicate cue type and reason): VCs to safe leaning while seated and holding hand rail as needed. Tub/ Shower Transfer: Nurse, learning disability Details (indicate cue type and reason): Mod verbal cues for safety and sequencing.  Pt side stepped using RW and grab bars into shower, min guard from OTR Functional mobility during ADLs: Minimal assistance;Rolling walker General ADL Comments: Pt requires max verbal and tactile cues for initiation of ADLs and functional transfers.  Pt requires min assist for balance while standing and ambulating.  Posterior lean appears improved compared to previous sessions.  Pt with shuffling gait.     Vision Patient Visual Report: No change from baseline     Perception     Praxis      Cognition Arousal/Alertness: Awake/alert Behavior During Therapy: Flat affect Overall Cognitive Status: Within Functional Limits for tasks assessed                                  General Comments: Increased process/response time.        Exercises Other Exercises Other Exercises: educated pt on safety and fall precautions, showering, functional transfers, lower body dressing Other Exercises: OTR facilitated shower this date- provided mod A in upper and lower body bathing   Shoulder Instructions       General Comments Pt with improved participation and initiation overall, still requires significant verbal and tactile cues to initiate many functional tasks    Pertinent Vitals/ Pain       Pain Assessment: No/denies pain Pain Intervention(s): Limited activity within patient's tolerance;Monitored during session  Home Living                                          Prior Functioning/Environment              Frequency  Min 2X/week        Progress Toward Goals  OT Goals(current goals can now be found in the care plan section)  Progress towards OT goals: Progressing toward goals  Acute Rehab OT Goals Patient Stated Goal: to improve strength/balance OT Goal Formulation: With patient Time For Goal Achievement: 04/28/19 Potential to Achieve Goals: Good  Plan Discharge plan remains appropriate    Co-evaluation  AM-PAC OT "6 Clicks" Daily Activity     Outcome Measure   Help from another person eating meals?: None Help from another person taking care of personal grooming?: A Lot Help from another person toileting, which includes using toliet, bedpan, or urinal?: A Little Help from another person bathing (including washing, rinsing, drying)?: A Lot Help from another person to put on and taking off regular upper body clothing?: A Little Help from another person to put on and taking off regular lower body clothing?: A Lot 6 Click Score: 16    End of Session Equipment Utilized During Treatment: Gait belt;Rolling walker  OT Visit Diagnosis: Unsteadiness on feet (R26.81);Muscle weakness  (generalized) (M62.81)   Activity Tolerance Patient tolerated treatment well;No increased pain   Patient Left in bed;with call bell/phone within reach;with bed alarm set   Nurse Communication          Time: 1330-1440 OT Time Calculation (min): 70 min  Charges: OT General Charges $OT Visit: 1 Visit OT Treatments $Self Care/Home Management : 53-67 mins $Therapeutic Activity: 8-22 mins  Myrtie Hawk Keara Pagliarulo, OTR/L 04/21/19, 2:57 PM

## 2019-04-22 DIAGNOSIS — F200 Paranoid schizophrenia: Secondary | ICD-10-CM | POA: Diagnosis not present

## 2019-04-22 NOTE — Progress Notes (Signed)
New order for cardiac monitoring, central telemetry aware . Tele box 40-37 placed on patient for monitor

## 2019-04-22 NOTE — Progress Notes (Signed)
Physical Therapy Treatment Patient Details Name: Curits Vining MRN: [id number] DOB: 1960-11-23 Today's Date: 04/22/2019    History of Present Illness Pt is a 59 y.o. male initially presenting to hospital 02/11/20 with IVC from group home d/t wandering off into woods multiple times (found near water); pt reporting suicidal thoughts of drowning himself.  Pt tripped on his own feet and hit chin on chair on morning of 2/4 (pt got back up and walked to restroom independently).  Unable to return to group home d/t safety concerns.  Per chart review pt scored 19/30 on [facility] cognitive assessment with psychiatry indicative of moderate cognitive impairment (consistent with pt's diagnosis of dementia).  Pt was in [place] in ED for about 44 days prior to being admitted to hospital 2/13 for generalized weakness, dysphagia, and slurred speech.  MRI negative for acute CVA.  PMH includes DM, htn, schizophrenia.    PT Comments    Pt received resting in supine and agreeable to PT. Pt minimally conversational with flat affect noted. Pt reporting no pain today. Pt required light min A for bed mobility and min guard for transfers and ambulation. Within room ambulation, pt ambulated a few feet to get face mask from window sill with RW to the side with one LOB requiring min-mod A to recover. With RW pt ambulated in hallway with improved stability. HR monitored during ambulation and noted to be 148 and not decreasing with standing rest so returned to room and further PT tx deferred. HR mid 120s once returned to supine and 115 after resting supine for a couple min. Nursing notified of HR response. Pt will continue to benefit from acute PT for further strengthening, balance and activity tolerance progression.    Follow Up Recommendations  Home health PT;Supervision/Assistance - 24 hour     Equipment Recommendations  Rolling walker with 5" wheels;3in1 (PT)    Recommendations for Other Services       Precautions /  Restrictions Precautions Precautions: Fall Precaution Comments: Suicide precautions; aspiration precautions; monitor HR Restrictions Weight Bearing Restrictions: No    Mobility  Bed Mobility Overal bed mobility: Needs Assistance Bed Mobility: Supine to Sit;Sit to Supine     Supine to sit: Min assist Sit to supine: Min assist   General bed mobility comments: light min A to get EOB and return to supine, verbal and tactile cuing for initiation  Transfers Overall transfer level: Needs assistance Equipment used: Rolling walker (2 wheeled) Transfers: Sit to/from Stand Sit to Stand: Min guard         General transfer comment: min guard for safety, slight posterior lean with transfer, cuing for hand placement with Rw  Ambulation/Gait Ambulation/Gait assistance: Min guard;Min assist Gait Distance (Feet): 75 Feet Assistive device: Rolling walker (2 wheeled) Gait Pattern/deviations: Step-through pattern;Decreased step length - right;Decreased step length - left;Trunk flexed Gait velocity: decreased   General Gait Details: pt ambulated a few feet within room leaving Rw to get face mask from window sill, pt with LOB required min-mod A to recover, pt ambulated into hallway with Rw, no further LOB however pt did require light min A for RW mgt around obstacles and with turns, HR elevated to 148 and not decreasing with standing rest so returned to room   Stairs             Wheelchair Mobility    Modified Rankin (Stroke Patients Only)       Balance Overall balance assessment: Needs assistance Sitting-balance support: No upper extremity supported;Feet supported  Sitting balance-Leahy Scale: Fair   Postural control: Posterior lean Standing balance support: Single extremity supported Standing balance-Leahy Scale: Fair Standing balance comment: reliant on BUE support with Rw for ambulation                            Cognition Arousal/Alertness:  Awake/alert Behavior During Therapy: Flat affect Overall Cognitive Status: Within Functional Limits for tasks assessed                                 General Comments: Increased process/response time.      Exercises      General Comments        Pertinent Vitals/Pain Pain Assessment: No/denies pain Faces Pain Scale: No hurt Pain Intervention(s): Limited activity within patient's tolerance;Monitored during session    Home Living                      Prior Function            PT Goals (current goals can now be found in the care plan section) Progress towards PT goals: Progressing toward goals    Frequency    Min 2X/week      PT Plan Current plan remains appropriate    Co-evaluation              AM-PAC PT "6 Clicks" Mobility   Outcome Measure  Help needed turning from your back to your side while in a flat bed without using bedrails?: A Little Help needed moving from lying on your back to sitting on the side of a flat bed without using bedrails?: A Lot Help needed moving to and from a bed to a chair (including a wheelchair)?: A Little Help needed standing up from a chair using your arms (e.g., wheelchair or bedside chair)?: A Little Help needed to walk in hospital room?: A Little Help needed climbing 3-5 steps with a railing? : A Little 6 Click Score: 17    End of Session Equipment Utilized During Treatment: Gait belt Activity Tolerance: Patient tolerated treatment well;Treatment limited secondary to medical complications (Comment);Other (comment)(elevated HR) Patient left: in bed;with call bell/phone within reach;with bed alarm set Nurse Communication: Mobility status;Other (comment)(elevated HR) PT Visit Diagnosis: Other abnormalities of gait and mobility (R26.89);Muscle weakness (generalized) (M62.81);History of falling (Z91.81);Difficulty in walking, not elsewhere classified (R26.2)     Time: [id number] PT Time Calculation  (min) (ACUTE ONLY): 13 min  Charges:  $Therapeutic Exercise: 8-22 mins                     Zachary George PT, DPT 2:43 PM,04/22/19 [phone]    Kalief Kattner Drucilla Chalet 04/22/2019, 2:38 PM

## 2019-04-22 NOTE — Progress Notes (Signed)
PROGRESS NOTE    Peter Parsons  [id number] DOB: [birth year] DOA: 02/11/2019 PCP: System, Provider Not In     Assessment & Plan:   Principal Problem:   Paranoid schizophrenia ([place]) Active Problems:   Altered mental status   Dementia ([place])   Weakness   Acute metabolic encephalopathy   Protein-calorie malnutrition, severe   Pressure injury of skin  Debilitation: PT recs home health w/ 24 hr supervision/assistance  Schizophrenia: w/ frequent suicidal ideation. Continue on paliperidone, klonopin, mirtazapine, risperidone. Does not have decisional capacity, does have a guardian. Psychiatry following and recs apprec   Depression/Bipolar disorder: unknown type and/or severity. Continue on lexapro, trazodone.  Psychiatry following and recs apprec  DM2: uncontrolled w/ chronic kidney disease stage IIIa. Will continue to hold home dose of metformin  Peripheral neuropathy: continue on gabapentin  BPH: continue Flomax  Dyslipidemia: continue statin  HTN: normotensive off all of his antihypertensives possibly secondary to weight loss.  Dysphagia, dysarthria, left facial droop: CVA ruled out with MRI. Currently still has some dysphagia, pockets foods, but takes in fluids ok.   Severe Malnutrition: continue on dietary supplements. Continue on mirtazapine    DVT prophylaxis: lovenox  Code Status: full  Family Communication:  Disposition Plan: CM is working w/ legal guardian to have pt placed in a group home    Consultants:   Psychiatry    Procedures:    Antimicrobials:    Subjective: Pt denies any complaints today   Objective: Vitals:   04/21/19 2124 04/21/19 2329 04/22/19 0352 04/22/19 0739  BP: 133/79 127/84 115/71 108/66  Pulse: 90 99 61 66  Resp: 16 18 18 18   Temp: 98.4 F (36.9 C) 98.8 F (37.1 C) (!) 97.3 F (36.3 C) 98.6 F (37 C)  TempSrc: Oral Oral  Oral  SpO2: 100% 98% 96% 100%  Weight:      Height:       No intake or output data in the  24 hours ending 04/22/19 0757 Filed Weights   02/11/19 1441 03/28/19 0306  Weight: 77.1 kg 70.8 kg    Examination:  General exam: Appears calm and comfortable  Respiratory system: Clear to auscultation. No wheezes Cardiovascular system: S1 & S2 +. No rubs, gallops or clicks.  Gastrointestinal system: Abdomen is nondistended, soft and nontender.normal bowel sounds heard. Central nervous system: Alert and awake. Moves all 4 extremities Psychiatry: Judgement and insight appear abnormal. Flat mood and affect    Data Reviewed: I have personally reviewed following labs and imaging studies  CBC: Recent Labs  Lab 04/19/19 1338  WBC 5.1  HGB 12.1*  HCT 35.3*  MCV 88.9  PLT [id number]   Basic Metabolic Panel: Recent Labs  Lab 04/17/19 0540 04/19/19 1338  NA 143 141  K 3.5 4.0  CL 99 100  CO2 33* 34*  GLUCOSE 91 142*  BUN 39* 40*  CREATININE 1.00 1.09  CALCIUM 9.3 9.6   GFR: Estimated Creatinine Clearance: 72.1 mL/min (by C-G formula based on SCr of 1.09 mg/dL). Liver Function Tests: Recent Labs  Lab 04/19/19 1338  AST 19  ALT 18  ALKPHOS 58  BILITOT 0.6  PROT 7.2  ALBUMIN 4.0   No results for input(s): LIPASE, AMYLASE in the last 168 hours. No results for input(s): AMMONIA in the last 168 hours. Coagulation Profile: No results for input(s): INR, PROTIME in the last 168 hours. Cardiac Enzymes: No results for input(s): CKTOTAL, CKMB, CKMBINDEX, TROPONINI in the last 168 hours. BNP (last 3 results) No results for  input(s): PROBNP in the last 8760 hours. HbA1C: No results for input(s): HGBA1C in the last 72 hours. CBG: Recent Labs  Lab 04/16/19 1137 04/16/19 1619 04/16/19 2115 04/17/19 0756 04/17/19 1147  GLUCAP 123* 128* 102* 84 95   Lipid Profile: No results for input(s): CHOL, HDL, LDLCALC, TRIG, CHOLHDL, LDLDIRECT in the last 72 hours. Thyroid Function Tests: No results for input(s): TSH, T4TOTAL, FREET4, T3FREE, THYROIDAB in the last 72 hours. Anemia  Panel: No results for input(s): VITAMINB12, FOLATE, FERRITIN, TIBC, IRON, RETICCTPCT in the last 72 hours. Sepsis Labs: No results for input(s): PROCALCITON, LATICACIDVEN in the last 168 hours.  No results found for this or any previous visit (from the past 240 hour(s)).       Radiology Studies: No results found.      Scheduled Meds: . atorvastatin  10 mg Oral Daily  . chlorhexidine  15 mL Mouth/Throat BID  . collagenase   Topical Daily  . enoxaparin (LOVENOX) injection  40 mg Subcutaneous Q24H  . escitalopram  10 mg Oral Daily  . feeding supplement (NEPRO CARB STEADY)  237 mL Oral TID BM  . feeding supplement (PRO-STAT SUGAR FREE 64)  30 mL Oral BID WC  . gabapentin  300 mg Oral TID  . mirtazapine  45 mg Oral QHS  . multivitamin with minerals  1 tablet Oral Daily  . risperiDONE  3 mg Oral BID  . senna-docusate  1 tablet Oral Daily  . tamsulosin  0.4 mg Oral Daily  . traZODone  150 mg Oral QHS   Continuous Infusions:   LOS: 24 days    Time spent: 30 mins   Wyvonnia Dusky, MD [facility] Pager [phone]  If 7PM-7AM, please contact night-coverage [email or web address] 04/22/2019, 7:57 AM

## 2019-04-22 NOTE — Progress Notes (Signed)
Patient reported to have pulse of 148 BPM while ambulating with PT, resting apical pulse is 108. Patient is alert and responds appropriately to questions. Asked how he is feeling, He says " Alright". With flat affect

## 2019-04-22 NOTE — Plan of Care (Signed)

## 2019-04-23 DIAGNOSIS — F200 Paranoid schizophrenia: Secondary | ICD-10-CM | POA: Diagnosis not present

## 2019-04-23 MED ORDER — METOPROLOL TARTRATE 5 MG/5ML IV SOLN
5.0000 mg | Freq: Two times a day (BID) | INTRAVENOUS | Status: DC | PRN
  Administered 2019-05-14: 10:00:00 5 mg via INTRAVENOUS
  Filled 2019-04-23: qty 5

## 2019-04-23 NOTE — Progress Notes (Signed)
Physical Therapy Treatment Patient Details Name: Peter Parsons MRN: [id number] DOB: Jan 04, 1961 Today's Date: 04/23/2019    History of Present Illness Pt is a 59 y.o. male initially presenting to hospital 02/11/20 with IVC from group home d/t wandering off into woods multiple times (found near water); pt reporting suicidal thoughts of drowning himself.  Pt tripped on his own feet and hit chin on chair on morning of 2/4 (pt got back up and walked to restroom independently).  Unable to return to group home d/t safety concerns.  Per chart review pt scored 19/30 on [facility] cognitive assessment with psychiatry indicative of moderate cognitive impairment (consistent with pt's diagnosis of dementia).  Pt was in [place] in ED for about 44 days prior to being admitted to hospital 2/13 for generalized weakness, dysphagia, and slurred speech.  MRI negative for acute CVA.  PMH includes DM, htn, schizophrenia.    PT Comments    Pt resting in bed upon PT arrival; HR fluctuating 102-111 bpm in sinus tach; nurse cleared pt to participate in therapy.  Min assist semi-supine to sitting edge of bed; CGA with transfers; and CGA ambulating a few feet bed to recliner with RW.  HR increased to 135 bpm with transfer to chair and within a few minutes decreased to 120 bpm at rest in recliner.  Deferred further activity d/t elevated HR at rest.  Nurse notified of pt's HR during session's activities.  During session pt stating that therapist and him were "light years away" from each other in thoughts and that it would take too long to explain things to therapist (therapist offered pt unlimited time to explain things but pt would not expand on his thoughts).  Will continue to focus on strengthening and progressive functional mobility per pt tolerance.    Follow Up Recommendations  Home health PT;Supervision/Assistance - 24 hour     Equipment Recommendations  Rolling walker with 5" wheels;3in1 (PT)    Recommendations for Other  Services       Precautions / Restrictions Precautions Precautions: Fall Precaution Comments: Suicide precautions; aspiration precautions; monitor HR Restrictions Weight Bearing Restrictions: No    Mobility  Bed Mobility Overal bed mobility: Needs Assistance Bed Mobility: Supine to Sit     Supine to sit: Min assist     General bed mobility comments: minimal assist for LE's and trunk semi-supine to sitting edge of bed (assist to initiate)  Transfers Overall transfer level: Needs assistance Equipment used: Rolling walker (2 wheeled) Transfers: Sit to/from Stand Sit to Stand: Min guard Stand pivot transfers: Min guard       General transfer comment: fairly strong stand up from bed; controlled descent sitting down into recliner; cueing for hand placement  Ambulation/Gait Ambulation/Gait assistance: Min guard Gait Distance (Feet): 3 Feet(bed to recliner) Assistive device: Rolling walker (2 wheeled)   Gait velocity: decreased   General Gait Details: shuffling type gait   Stairs             Wheelchair Mobility    Modified Rankin (Stroke Patients Only)       Balance Overall balance assessment: Needs assistance Sitting-balance support: No upper extremity supported;Feet supported Sitting balance-Leahy Scale: Good Sitting balance - Comments: steady sitting reaching within BOS   Standing balance support: Single extremity supported Standing balance-Leahy Scale: Fair Standing balance comment: pt requiring at least single UE support for static standing balance  Cognition Arousal/Alertness: Awake/alert Behavior During Therapy: Flat affect Overall Cognitive Status: Within Functional Limits for tasks assessed                                 General Comments: Increased process/response time.      Exercises      General Comments   Nursing cleared pt for participation in physical therapy.  Pt requiring  encouragement to participate in session's activities.      Pertinent Vitals/Pain Pain Assessment: Faces Faces Pain Scale: Hurts a little bit Pain Location: "tail bone" Pain Descriptors / Indicators: Sore Pain Intervention(s): Limited activity within patient's tolerance;Monitored during session;Repositioned  O2 sats WFL on room air.    Home Living                      Prior Function            PT Goals (current goals can now be found in the care plan section) Acute Rehab PT Goals Patient Stated Goal: to improve strength/balance PT Goal Formulation: With patient Time For Goal Achievement: 05/03/19 Potential to Achieve Goals: Good Progress towards PT goals: Progressing toward goals    Frequency    Min 2X/week      PT Plan Current plan remains appropriate    Co-evaluation              AM-PAC PT "6 Clicks" Mobility   Outcome Measure  Help needed turning from your back to your side while in a flat bed without using bedrails?: A Little Help needed moving from lying on your back to sitting on the side of a flat bed without using bedrails?: A Little Help needed moving to and from a bed to a chair (including a wheelchair)?: A Little Help needed standing up from a chair using your arms (e.g., wheelchair or bedside chair)?: A Little Help needed to walk in hospital room?: A Little Help needed climbing 3-5 steps with a railing? : A Little 6 Click Score: 18    End of Session Equipment Utilized During Treatment: Gait belt Activity Tolerance: Treatment limited secondary to medical complications (Comment) Patient left: in chair;with call bell/phone within reach;with chair alarm set;Other (comment)(B heels floating via pillow) Nurse Communication: Mobility status;Precautions;Other (comment)(Pt's HR with activity) PT Visit Diagnosis: Other abnormalities of gait and mobility (R26.89);Muscle weakness (generalized) (M62.81);History of falling (Z91.81);Difficulty in  walking, not elsewhere classified (R26.2)     Time: [id number] PT Time Calculation (min) (ACUTE ONLY): 31 min  Charges:  $Therapeutic Activity: 23-37 mins                     Leitha Bleak, PT 04/23/19, 2:00 PM

## 2019-04-23 NOTE — Progress Notes (Signed)
PROGRESS NOTE    Peter Parsons  [id number] DOB: Jul 11, 1960 DOA: 02/11/2019 PCP: System, Provider Not In     Assessment & Plan:   Principal Problem:   Paranoid schizophrenia ([place]) Active Problems:   Altered mental status   Dementia ([place])   Weakness   Acute metabolic encephalopathy   Protein-calorie malnutrition, severe   Pressure injury of skin  Debilitation: PT recs home health w/ 24 hr supervision/assistance  Schizophrenia: w/ frequent suicidal ideation. Continue on paliperidone, klonopin, mirtazapine, risperidone. Does not have decisional capacity, does have a guardian. Psychiatry following and recs apprec   Depression/Bipolar disorder: unknown type and/or severity. Continue on lexapro, trazodone.  Psychiatry following and recs apprec  DM2: uncontrolled w/ chronic kidney disease stage IIIa. Will continue to hold home dose of metformin  Peripheral neuropathy: continue on gabapentin  BPH: continue Flomax  Dyslipidemia: continue statin  HTN: normotensive off all of his antihypertensives possibly secondary to weight loss.  Dysphagia, dysarthria, left facial droop: CVA ruled out with MRI. Currently still has some dysphagia, pockets foods, but takes in fluids ok.   Severe Malnutrition: continue on dietary supplements. Continue on mirtazapine    DVT prophylaxis: lovenox  Code Status: full  Family Communication:  Disposition Plan: CM is still working w/ legal guardian to have pt placed in a group home    Consultants:   Psychiatry    Procedures:    Antimicrobials:    Subjective: Pt would not answer my questions again today.   Objective: Vitals:   04/22/19 0352 04/22/19 0739 04/22/19 1627 04/22/19 2304  BP: 115/71 108/66 113/71 106/62  Pulse: 61 66 82 67  Resp: 18 18 18 17   Temp: (!) 97.3 F (36.3 C) 98.6 F (37 C) 98.9 F (37.2 C) 98.6 F (37 C)  TempSrc:  Oral Oral Oral  SpO2: 96% 100% 97% 97%  Weight:      Height:       No intake or  output data in the 24 hours ending 04/23/19 0727 Filed Weights   02/11/19 1441 03/28/19 0306  Weight: 77.1 kg 70.8 kg    Examination:  General exam: Appears calm and comfortable  Respiratory system: Clear to auscultation. No accessory muscle use. No rales Cardiovascular system: S1 & S2 +. No rubs, gallops or clicks.  Gastrointestinal system: Abdomen is nondistended, soft and nontender. normal bowel sounds heard. Central nervous system: Alert and awake. Moves all 4 extremities Psychiatry: Judgement and insight appear abnormal. Flat mood and affect    Data Reviewed: I have personally reviewed following labs and imaging studies  CBC: Recent Labs  Lab 04/19/19 1338  WBC 5.1  HGB 12.1*  HCT 35.3*  MCV 88.9  PLT 123XX123   Basic Metabolic Panel: Recent Labs  Lab 04/17/19 0540 04/19/19 1338  NA 143 141  K 3.5 4.0  CL 99 100  CO2 33* 34*  GLUCOSE 91 142*  BUN 39* 40*  CREATININE 1.00 1.09  CALCIUM 9.3 9.6   GFR: Estimated Creatinine Clearance: 72.1 mL/min (by C-G formula based on SCr of 1.09 mg/dL). Liver Function Tests: Recent Labs  Lab 04/19/19 1338  AST 19  ALT 18  ALKPHOS 58  BILITOT 0.6  PROT 7.2  ALBUMIN 4.0   No results for input(s): LIPASE, AMYLASE in the last 168 hours. No results for input(s): AMMONIA in the last 168 hours. Coagulation Profile: No results for input(s): INR, PROTIME in the last 168 hours. Cardiac Enzymes: No results for input(s): CKTOTAL, CKMB, CKMBINDEX, TROPONINI in the last  168 hours. BNP (last 3 results) No results for input(s): PROBNP in the last 8760 hours. HbA1C: No results for input(s): HGBA1C in the last 72 hours. CBG: Recent Labs  Lab 04/16/19 1137 04/16/19 1619 04/16/19 2115 04/17/19 0756 04/17/19 1147  GLUCAP 123* 128* 102* 84 95   Lipid Profile: No results for input(s): CHOL, HDL, LDLCALC, TRIG, CHOLHDL, LDLDIRECT in the last 72 hours. Thyroid Function Tests: No results for input(s): TSH, T4TOTAL, FREET4, T3FREE,  THYROIDAB in the last 72 hours. Anemia Panel: No results for input(s): VITAMINB12, FOLATE, FERRITIN, TIBC, IRON, RETICCTPCT in the last 72 hours. Sepsis Labs: No results for input(s): PROCALCITON, LATICACIDVEN in the last 168 hours.  No results found for this or any previous visit (from the past 240 hour(s)).       Radiology Studies: No results found.      Scheduled Meds: . atorvastatin  10 mg Oral Daily  . chlorhexidine  15 mL Mouth/Throat BID  . collagenase   Topical Daily  . enoxaparin (LOVENOX) injection  40 mg Subcutaneous Q24H  . escitalopram  10 mg Oral Daily  . feeding supplement (NEPRO CARB STEADY)  237 mL Oral TID BM  . feeding supplement (PRO-STAT SUGAR FREE 64)  30 mL Oral BID WC  . gabapentin  300 mg Oral TID  . mirtazapine  45 mg Oral QHS  . multivitamin with minerals  1 tablet Oral Daily  . risperiDONE  3 mg Oral BID  . senna-docusate  1 tablet Oral Daily  . tamsulosin  0.4 mg Oral Daily  . traZODone  150 mg Oral QHS   Continuous Infusions:   LOS: 25 days    Time spent: 20 mins   Wyvonnia Dusky, MD [facility] Pager [phone]  If 7PM-7AM, please contact night-coverage [email or web address] 04/23/2019, 7:27 AM

## 2019-04-23 NOTE — Progress Notes (Signed)
Occupational Therapy Treatment Patient Details Name: Peter Parsons MRN: [id number] DOB: 1960-10-05 Today's Date: 04/23/2019    History of present illness Pt is a 59 y.o. male initially presenting to hospital 02/11/20 with IVC from group home d/t wandering off into woods multiple times (found near water); pt reporting suicidal thoughts of drowning himself.  Pt tripped on his own feet and hit chin on chair on morning of 2/4 (pt got back up and walked to restroom independently).  Unable to return to group home d/t safety concerns.  Per chart review pt scored 19/30 on [facility] cognitive assessment with psychiatry indicative of moderate cognitive impairment (consistent with pt's diagnosis of dementia).  Pt was in [place] in ED for about 44 days prior to being admitted to hospital 2/13 for generalized weakness, dysphagia, and slurred speech.  MRI negative for acute CVA.  PMH includes DM, htn, schizophrenia.   OT comments  Pt continues to present to OT with flat affect, but has increased engagement and participation in conversation when asked reminiscence-based questions (i.e. where he grew up, where he has traveled, what he likes to do for fun).  Pt reports he likes "to bead", or make jewelry for his family members.  Pt also continues to have disorganized thoughts, often answering OT's questions with unrelated content.  When asked questions related to today's activity or related to the hospital context, pt is often unable to give answer (i.e. unable to answer if he would like to stay in bed or the recliner).  Pt with elevated heart rate (125 at rest), so activity limited in today's session.  OTR provided min guard for sit to stand transfer and stand pivot from recliner to EOB.   Pt required 2-3 verbal cues for initiation of transfer.  OTR provided min assist for BLE management for proper positioning in bed.  Pt has physical capacity to be independent in bed mobility, but presents catatonic/limited participation when  cued to straighten out while laying in bed.  Pt will continue to benefit from skilled OT services to optimize occupational participation and safety/independence in ADLs.  HHOT with 24 hour supervision remains most appropriate discharge recommendation.    Follow Up Recommendations  Home health OT;Supervision/Assistance - 24 hour    Equipment Recommendations  3 in 1 bedside commode;Other (comment)    Recommendations for Other Services      Precautions / Restrictions Precautions Precautions: Fall Precaution Comments: Suicide precautions; aspiration precautions; monitor HR Restrictions Weight Bearing Restrictions: No       Mobility Bed Mobility Overal bed mobility: Needs Assistance Bed Mobility: Supine to Sit     Supine to sit: Min assist Sit to supine: Min assist   General bed mobility comments: intermittent min assist for BLE and trunk to straighten out in bed after supine > sit.  Pt has physical capacity to complete bed mobility independently, but has poor initiation with verbal commands for positioning.  Transfers Overall transfer level: Needs assistance Equipment used: Rolling walker (2 wheeled) Transfers: Sit to/from Stand Sit to Stand: Min guard Stand pivot transfers: Min guard       General transfer comment: No LOB in sit <> stand transfer and stand pivot from recliner to bed.  2-3 verbal cues needed for initiation.    Balance Overall balance assessment: Needs assistance Sitting-balance support: No upper extremity supported;Feet supported Sitting balance-Leahy Scale: Good Sitting balance - Comments: steady sitting reaching within BOS Postural control: Posterior lean Standing balance support: Single extremity supported Standing balance-Leahy Scale: Fair Standing balance  comment: pt requiring at least single UE support for static standing balance                           ADL either performed or assessed with clinical judgement   ADL Overall ADL's :  Needs assistance/impaired                                       General ADL Comments: Pt requires max verbal and tactile cues for initiation of ADLs and functional transfers.  Pt requires min assist for balance while standing and ambulating.  Posterior lean appears improved compared to previous sessions.  Pt with shuffling gait.     Vision Patient Visual Report: No change from baseline     Perception     Praxis      Cognition Arousal/Alertness: Awake/alert Behavior During Therapy: Flat affect Overall Cognitive Status: Within Functional Limits for tasks assessed                                 General Comments: Increased process/response time.        Exercises Other Exercises Other Exercises: provided education on safety and fall precautions, self care, engaging in/promoting leisure occupations   Shoulder Instructions       General Comments HR at 125 at rest, did not increase with sit to stand transfer.  HR monitored and activity limited 2/2 elevated HR.    Pertinent Vitals/ Pain       Pain Assessment: No/denies pain Faces Pain Scale: Hurts a little bit Pain Location: "tail bone" Pain Descriptors / Indicators: Sore Pain Intervention(s): Limited activity within patient's tolerance;Monitored during session;Repositioned  Home Living                                          Prior Functioning/Environment              Frequency  Min 2X/week        Progress Toward Goals  OT Goals(current goals can now be found in the care plan section)  Progress towards OT goals: Progressing toward goals  Acute Rehab OT Goals Patient Stated Goal: to improve strength/balance OT Goal Formulation: With patient Time For Goal Achievement: 04/28/19 Potential to Achieve Goals: Good  Plan Discharge plan remains appropriate    Co-evaluation                 AM-PAC OT "6 Clicks" Daily Activity     Outcome Measure   Help  from another person eating meals?: None Help from another person taking care of personal grooming?: A Lot Help from another person toileting, which includes using toliet, bedpan, or urinal?: A Little Help from another person bathing (including washing, rinsing, drying)?: A Lot Help from another person to put on and taking off regular upper body clothing?: A Little Help from another person to put on and taking off regular lower body clothing?: A Lot 6 Click Score: 16    End of Session Equipment Utilized During Treatment: Gait belt;Rolling walker  OT Visit Diagnosis: Unsteadiness on feet (R26.81);Muscle weakness (generalized) (M62.81)   Activity Tolerance Patient tolerated treatment well;No increased pain   Patient Left in bed;with call bell/phone within reach;with  bed alarm set   Nurse Communication          Time: [phone] OT Time Calculation (min): 20 min  Charges: OT General Charges $OT Visit: 1 Visit OT Treatments $Therapeutic Activity: 8-22 mins  Myrtie Hawk Irmalee Riemenschneider, OTR/L 04/23/19, 2:45 PM

## 2019-04-24 DIAGNOSIS — F200 Paranoid schizophrenia: Secondary | ICD-10-CM | POA: Diagnosis not present

## 2019-04-24 NOTE — Progress Notes (Signed)
Unable to get patient up this morning due to lack of nurse tech help on the floor and lack of time as well as patient's lethargy.

## 2019-04-24 NOTE — Progress Notes (Signed)
PROGRESS NOTE    Peter Parsons  [id number] DOB: August 30, 1960 DOA: 02/11/2019 PCP: System, Provider Not In     Assessment & Plan:   Principal Problem:   Paranoid schizophrenia ([place]) Active Problems:   Altered mental status   Dementia ([place])   Weakness   Acute metabolic encephalopathy   Protein-calorie malnutrition, severe   Pressure injury of skin  Debilitation: PT recs home health w/ 24 hr supervision/assistance  Schizophrenia: w/ frequent suicidal ideation. Continue on paliperidone, klonopin, mirtazapine, risperidone. Does not have decisional capacity, does have a guardian. Psychiatry following and recs apprec   Depression/Bipolar disorder: unknown type and/or severity. Continue on lexapro, trazodone.  Psychiatry following and recs apprec  DM2: uncontrolled. Will continue to hold home dose of metformin  CKDIIIa: Cr is stable. Will continue to monitor periodically   Peripheral neuropathy: continue on gabapentin  BPH: continue Flomax  Dyslipidemia: continue statin  HTN: normotensive off all of his antihypertensives possibly secondary to weight loss.  Dysphagia, dysarthria, left facial droop: CVA ruled out with MRI. Currently still has some dysphagia, pockets foods, but takes in fluids ok. Continue w/ dysphagia diet   Severe Malnutrition: continue on dietary supplements. Continue on mirtazapine    DVT prophylaxis: lovenox  Code Status: full  Family Communication:  Disposition Plan: CM is still working w/ legal guardian to have pt placed in a group home    Consultants:   Psychiatry    Procedures:    Antimicrobials:    Subjective: Pt denies any complaints today   Objective: Vitals:   04/23/19 1700 04/23/19 2145 04/24/19 0057 04/24/19 0546  BP:  119/74 101/63 112/71  Pulse: (!) 101 88 69 73  Resp:  16 16 16   Temp:  98.6 F (37 C) 99 F (37.2 C) 99.5 F (37.5 C)  TempSrc:  Oral Oral Oral  SpO2:  97% 95% 94%  Weight:      Height:         Intake/Output Summary (Last 24 hours) at 04/24/2019 0748 Last data filed at 04/23/2019 2100 Gross per 24 hour  Intake --  Output 700 ml  Net -700 ml   Filed Weights   02/11/19 1441 03/28/19 0306  Weight: 77.1 kg 70.8 kg    Examination:  General exam: Appears calm and comfortable  Respiratory system: Clear to auscultation. No accessory muscle use. No wheezes. Cardiovascular system: S1 & S2 +. No rubs, gallops or clicks.  Gastrointestinal system: Abdomen is nondistended, soft and nontender. normal bowel sounds heard. Central nervous system: Alert and awake. Moves all 4 extremities Psychiatry: Judgement and insight appear abnormal. Flat mood and affect    Data Reviewed: I have personally reviewed following labs and imaging studies  CBC: Recent Labs  Lab 04/19/19 1338  WBC 5.1  HGB 12.1*  HCT 35.3*  MCV 88.9  PLT 123XX123   Basic Metabolic Panel: Recent Labs  Lab 04/19/19 1338  NA 141  K 4.0  CL 100  CO2 34*  GLUCOSE 142*  BUN 40*  CREATININE 1.09  CALCIUM 9.6   GFR: Estimated Creatinine Clearance: 72.1 mL/min (by C-G formula based on SCr of 1.09 mg/dL). Liver Function Tests: Recent Labs  Lab 04/19/19 1338  AST 19  ALT 18  ALKPHOS 58  BILITOT 0.6  PROT 7.2  ALBUMIN 4.0   No results for input(s): LIPASE, AMYLASE in the last 168 hours. No results for input(s): AMMONIA in the last 168 hours. Coagulation Profile: No results for input(s): INR, PROTIME in the last 168 hours.  Cardiac Enzymes: No results for input(s): CKTOTAL, CKMB, CKMBINDEX, TROPONINI in the last 168 hours. BNP (last 3 results) No results for input(s): PROBNP in the last 8760 hours. HbA1C: No results for input(s): HGBA1C in the last 72 hours. CBG: Recent Labs  Lab 04/17/19 0756 04/17/19 1147  GLUCAP 84 95   Lipid Profile: No results for input(s): CHOL, HDL, LDLCALC, TRIG, CHOLHDL, LDLDIRECT in the last 72 hours. Thyroid Function Tests: No results for input(s): TSH, T4TOTAL, FREET4,  T3FREE, THYROIDAB in the last 72 hours. Anemia Panel: No results for input(s): VITAMINB12, FOLATE, FERRITIN, TIBC, IRON, RETICCTPCT in the last 72 hours. Sepsis Labs: No results for input(s): PROCALCITON, LATICACIDVEN in the last 168 hours.  No results found for this or any previous visit (from the past 240 hour(s)).       Radiology Studies: No results found.      Scheduled Meds: . atorvastatin  10 mg Oral Daily  . chlorhexidine  15 mL Mouth/Throat BID  . collagenase   Topical Daily  . enoxaparin (LOVENOX) injection  40 mg Subcutaneous Q24H  . escitalopram  10 mg Oral Daily  . feeding supplement (NEPRO CARB STEADY)  237 mL Oral TID BM  . feeding supplement (PRO-STAT SUGAR FREE 64)  30 mL Oral BID WC  . gabapentin  300 mg Oral TID  . mirtazapine  45 mg Oral QHS  . multivitamin with minerals  1 tablet Oral Daily  . risperiDONE  3 mg Oral BID  . senna-docusate  1 tablet Oral Daily  . tamsulosin  0.4 mg Oral Daily  . traZODone  150 mg Oral QHS   Continuous Infusions:   LOS: 26 days    Time spent: 20 mins   Wyvonnia Dusky, MD [facility] Pager [phone]  If 7PM-7AM, please contact night-coverage [email or web address] 04/24/2019, 7:48 AM

## 2019-04-25 DIAGNOSIS — F200 Paranoid schizophrenia: Secondary | ICD-10-CM | POA: Diagnosis not present

## 2019-04-25 NOTE — Progress Notes (Signed)
PROGRESS NOTE    Danton Gahan  [id number] DOB: 09/09/1960 DOA: 02/11/2019 PCP: System, Provider Not In     Assessment & Plan:   Principal Problem:   Paranoid schizophrenia ([place]) Active Problems:   Altered mental status   Dementia ([place])   Weakness   Acute metabolic encephalopathy   Protein-calorie malnutrition, severe   Pressure injury of skin  Debilitation: PT recs home health w/ 24 hr supervision/assistance  Schizophrenia: w/ frequent suicidal ideation. Continue on paliperidone, klonopin, mirtazapine, risperidone. Does not have decisional capacity, does have a guardian. Psychiatry following and recs apprec   Depression/Bipolar disorder: unknown type and/or severity. Continue on lexapro, trazodone.  Psychiatry following and recs apprec  DM2: uncontrolled. Will continue to hold home dose of metformin  CKDIIIa: Cr is stable. Will continue to monitor periodically   Peripheral neuropathy: continue on gabapentin  BPH: continue Flomax  Dyslipidemia: continue statin  HTN: normotensive off all of his antihypertensives possibly secondary to weight loss. Resolved  Dysphagia, dysarthria, left facial droop: CVA ruled out with MRI. Currently still has some dysphagia, pockets foods, but takes in fluids ok. Continue w/ dysphagia diet   Severe Malnutrition: continue on dietary supplements. Continue on mirtazapine    DVT prophylaxis: lovenox  Code Status: full  Family Communication:  Disposition Plan: CM is still working w/ legal guardian to have pt placed in a group home    Consultants:   Psychiatry    Procedures:    Antimicrobials:    Subjective: Pt will not answer any of my questions today.   Objective: Vitals:   04/24/19 1610 04/24/19 2025 04/24/19 2308 04/25/19 0740  BP: 114/79 109/72 131/80 114/71  Pulse: 96 78 (!) 101 61  Resp:  16 16 13   Temp: 99.2 F (37.3 C) 99.6 F (37.6 C) 99.2 F (37.3 C) 98 F (36.7 C)  TempSrc: Oral Oral Oral Oral    SpO2: 95% 95% 97% 97%  Weight:      Height:        Intake/Output Summary (Last 24 hours) at 04/25/2019 0743 Last data filed at 04/25/2019 0500 Gross per 24 hour  Intake 937 ml  Output 600 ml  Net 337 ml   Filed Weights   02/11/19 1441 03/28/19 0306  Weight: 77.1 kg 70.8 kg    Examination:  General exam: Appears calm and comfortable  Respiratory system: Clear to auscultation. No accessory muscle use. No rhonchi Cardiovascular system: S1 & S2 +. No rubs, gallops or clicks.  Gastrointestinal system: Abdomen is nondistended, soft and nontender. normal bowel sounds heard. Central nervous system: Alert and awake. Moves all 4 extremities Psychiatry: Judgement and insight appear abnormal. Flat mood and affect    Data Reviewed: I have personally reviewed following labs and imaging studies  CBC: Recent Labs  Lab 04/19/19 1338  WBC 5.1  HGB 12.1*  HCT 35.3*  MCV 88.9  PLT 123XX123   Basic Metabolic Panel: Recent Labs  Lab 04/19/19 1338  NA 141  K 4.0  CL 100  CO2 34*  GLUCOSE 142*  BUN 40*  CREATININE 1.09  CALCIUM 9.6   GFR: Estimated Creatinine Clearance: 72.1 mL/min (by C-G formula based on SCr of 1.09 mg/dL). Liver Function Tests: Recent Labs  Lab 04/19/19 1338  AST 19  ALT 18  ALKPHOS 58  BILITOT 0.6  PROT 7.2  ALBUMIN 4.0   No results for input(s): LIPASE, AMYLASE in the last 168 hours. No results for input(s): AMMONIA in the last 168 hours. Coagulation Profile: No  results for input(s): INR, PROTIME in the last 168 hours. Cardiac Enzymes: No results for input(s): CKTOTAL, CKMB, CKMBINDEX, TROPONINI in the last 168 hours. BNP (last 3 results) No results for input(s): PROBNP in the last 8760 hours. HbA1C: No results for input(s): HGBA1C in the last 72 hours. CBG: No results for input(s): GLUCAP in the last 168 hours. Lipid Profile: No results for input(s): CHOL, HDL, LDLCALC, TRIG, CHOLHDL, LDLDIRECT in the last 72 hours. Thyroid Function Tests: No  results for input(s): TSH, T4TOTAL, FREET4, T3FREE, THYROIDAB in the last 72 hours. Anemia Panel: No results for input(s): VITAMINB12, FOLATE, FERRITIN, TIBC, IRON, RETICCTPCT in the last 72 hours. Sepsis Labs: No results for input(s): PROCALCITON, LATICACIDVEN in the last 168 hours.  No results found for this or any previous visit (from the past 240 hour(s)).       Radiology Studies: No results found.      Scheduled Meds: . atorvastatin  10 mg Oral Daily  . chlorhexidine  15 mL Mouth/Throat BID  . collagenase   Topical Daily  . enoxaparin (LOVENOX) injection  40 mg Subcutaneous Q24H  . escitalopram  10 mg Oral Daily  . feeding supplement (NEPRO CARB STEADY)  237 mL Oral TID BM  . feeding supplement (PRO-STAT SUGAR FREE 64)  30 mL Oral BID WC  . gabapentin  300 mg Oral TID  . mirtazapine  45 mg Oral QHS  . multivitamin with minerals  1 tablet Oral Daily  . risperiDONE  3 mg Oral BID  . senna-docusate  1 tablet Oral Daily  . tamsulosin  0.4 mg Oral Daily  . traZODone  150 mg Oral QHS   Continuous Infusions:   LOS: 27 days    Time spent: 10 mins   Wyvonnia Dusky, MD [facility] Pager [phone]  If 7PM-7AM, please contact night-coverage [email or web address] 04/25/2019, 7:43 AM

## 2019-04-25 NOTE — Progress Notes (Signed)
End of Shift Summary:  Date: 04/25/19 Shift: 0700-1900 Ambulatory: x2 turn Significant Events: Patient lethargic for most of day; x2 turn and set up to feed. VSS. No other significant events.

## 2019-04-26 DIAGNOSIS — F200 Paranoid schizophrenia: Secondary | ICD-10-CM | POA: Diagnosis not present

## 2019-04-26 LAB — BASIC METABOLIC PANEL
Anion gap: 8 (ref 5–15)
BUN: 42 mg/dL — ABNORMAL HIGH (ref 6–20)
CO2: 34 mmol/L — ABNORMAL HIGH (ref 22–32)
Calcium: 8.9 mg/dL (ref 8.9–10.3)
Chloride: 99 mmol/L (ref 98–111)
Creatinine, Ser: 0.98 mg/dL (ref 0.61–1.24)
GFR calc Af Amer: >60 mL/min (ref >60)
GFR calc non Af Amer: >60 mL/min (ref >60)
Glucose, Bld: 107 mg/dL — ABNORMAL HIGH (ref 70–99)
Potassium: 3.4 mmol/L — ABNORMAL LOW (ref 3.5–5.1)
Sodium: 141 mmol/L (ref 135–145)

## 2019-04-26 LAB — CBC
HCT: 30.8 % — ABNORMAL LOW (ref 39.0–52.0)
Hemoglobin: 10.6 g/dL — ABNORMAL LOW (ref 13.0–17.0)
MCH: 30.6 pg (ref 26.0–34.0)
MCHC: 34.4 g/dL (ref 30.0–36.0)
MCV: 89 fL (ref 80.0–100.0)
Platelets: 142 10*3/uL — ABNORMAL LOW (ref 150–400)
RBC: 3.46 MIL/uL — ABNORMAL LOW (ref 4.22–5.81)
RDW: 14.3 % (ref 11.5–15.5)
WBC: 5.5 10*3/uL (ref 4.0–10.5)
nRBC: 0 % (ref 0.0–0.2)

## 2019-04-26 MED ORDER — POTASSIUM CHLORIDE CRYS ER 20 MEQ PO TBCR
40.0000 meq | EXTENDED_RELEASE_TABLET | Freq: Once | ORAL | Status: AC
  Administered 2019-04-26: 13:00:00 40 meq via ORAL
  Filled 2019-04-26: qty 2

## 2019-04-26 NOTE — Progress Notes (Signed)
PT Cancellation Note  Patient Details Name: Peter Parsons MRN: [id number] DOB: Dec 22, 1960   Cancelled Treatment:    Reason Eval/Treat Not Completed: Fatigue/lethargy limiting ability to participate.  Pt still sleeping this morning.  Nurse attempted to wake pt up but pt only briefly opened his eyes once and appeared to go back to sleep.  Will re-attempt PT treatment session at a later date/time.  Leitha Bleak, PT 04/26/19, 10:37 AM
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
## 2019-04-26 NOTE — Progress Notes (Signed)
PROGRESS NOTE    Peter Parsons  [id number] DOB: [birth year] DOA: 02/11/2019 PCP: System, Provider Not In     Assessment & Plan:   Principal Problem:   Paranoid schizophrenia ([place]) Active Problems:   Altered mental status   Dementia ([place])   Weakness   Acute metabolic encephalopathy   Protein-calorie malnutrition, severe   Pressure injury of skin  Debilitation: PT recs home health w/ 24 hr supervision/assistance  Schizophrenia: w/ frequent suicidal ideation. Continue on paliperidone, klonopin, mirtazapine, risperidone. Does not have decisional capacity, does have a guardian. Psychiatry following and recs apprec   Depression/Bipolar disorder: unknown type and/or severity. Continue on lexapro, trazodone.  Psychiatry following and recs apprec  DM2: uncontrolled. Will continue to hold home dose of metformin  CKDIIIa: Cr is stable. Will continue to monitor periodically   Hypokalemia: KCl repleated. Will continue to monitor   Thrombocytopenia: etiology unclear. Will continue to monitor   Peripheral neuropathy: continue on gabapentin  BPH: continue Flomax  Dyslipidemia: continue statin  HTN: normotensive off all of his antihypertensives possibly secondary to weight loss. Resolved  Dysphagia, dysarthria, left facial droop: CVA ruled out with MRI. Currently still has some dysphagia, pockets foods, but takes in fluids ok. Continue w/ dysphagia diet   Severe Malnutrition: continue on dietary supplements. Continue on mirtazapine    DVT prophylaxis: lovenox  Code Status: full  Family Communication:  Disposition Plan: CM is still working w/ legal guardian to have pt placed in a group home    Consultants:   Psychiatry    Procedures:    Antimicrobials:    Subjective: Pt will not answer any of my questions again today.   Objective: Vitals:   04/24/19 2308 04/25/19 0740 04/25/19 1705 04/25/19 2157  BP: 131/80 114/71 133/79 (!) 145/85  Pulse: (!) 101 61 97  (!) 105  Resp: 16 13  20   Temp: 99.2 F (37.3 C) 98 F (36.7 C) 99 F (37.2 C) 98.9 F (37.2 C)  TempSrc: Oral Oral  Oral  SpO2: 97% 97% 97% 100%  Weight:      Height:       No intake or output data in the 24 hours ending 04/26/19 0739 Filed Weights   02/11/19 1441 03/28/19 0306  Weight: 77.1 kg 70.8 kg    Examination:  General exam: Appears calm and comfortable  Respiratory system: Clear to auscultation. No accessory muscle use. No wheezes, rales Cardiovascular system: S1 & S2 +. No rubs, gallops or clicks.  Gastrointestinal system: Abdomen is nondistended, soft and nontender. normal bowel sounds heard. Central nervous system: Alert and awake. Moves all 4 extremities Psychiatry: Judgement and insight appear abnormal. Flat mood and affect    Data Reviewed: I have personally reviewed following labs and imaging studies  CBC: Recent Labs  Lab 04/19/19 1338 04/26/19 0541  WBC 5.1 5.5  HGB 12.1* 10.6*  HCT 35.3* 30.8*  MCV 88.9 89.0  PLT 177 [id number]*   Basic Metabolic Panel: Recent Labs  Lab 04/19/19 1338 04/26/19 0541  NA 141 141  K 4.0 3.4*  CL 100 99  CO2 34* 34*  GLUCOSE 142* 107*  BUN 40* 42*  CREATININE 1.09 0.98  CALCIUM 9.6 8.9   GFR: Estimated Creatinine Clearance: 80.2 mL/min (by C-G formula based on SCr of 0.98 mg/dL). Liver Function Tests: Recent Labs  Lab 04/19/19 1338  AST 19  ALT 18  ALKPHOS 58  BILITOT 0.6  PROT 7.2  ALBUMIN 4.0   No results for input(s): LIPASE, AMYLASE in  the last 168 hours. No results for input(s): AMMONIA in the last 168 hours. Coagulation Profile: No results for input(s): INR, PROTIME in the last 168 hours. Cardiac Enzymes: No results for input(s): CKTOTAL, CKMB, CKMBINDEX, TROPONINI in the last 168 hours. BNP (last 3 results) No results for input(s): PROBNP in the last 8760 hours. HbA1C: No results for input(s): HGBA1C in the last 72 hours. CBG: No results for input(s): GLUCAP in the last 168 hours. Lipid  Profile: No results for input(s): CHOL, HDL, LDLCALC, TRIG, CHOLHDL, LDLDIRECT in the last 72 hours. Thyroid Function Tests: No results for input(s): TSH, T4TOTAL, FREET4, T3FREE, THYROIDAB in the last 72 hours. Anemia Panel: No results for input(s): VITAMINB12, FOLATE, FERRITIN, TIBC, IRON, RETICCTPCT in the last 72 hours. Sepsis Labs: No results for input(s): PROCALCITON, LATICACIDVEN in the last 168 hours.  No results found for this or any previous visit (from the past 240 hour(s)).       Radiology Studies: No results found.      Scheduled Meds: . atorvastatin  10 mg Oral Daily  . chlorhexidine  15 mL Mouth/Throat BID  . collagenase   Topical Daily  . enoxaparin (LOVENOX) injection  40 mg Subcutaneous Q24H  . escitalopram  10 mg Oral Daily  . feeding supplement (NEPRO CARB STEADY)  237 mL Oral TID BM  . feeding supplement (PRO-STAT SUGAR FREE 64)  30 mL Oral BID WC  . gabapentin  300 mg Oral TID  . mirtazapine  45 mg Oral QHS  . multivitamin with minerals  1 tablet Oral Daily  . potassium chloride  40 mEq Oral Once  . risperiDONE  3 mg Oral BID  . senna-docusate  1 tablet Oral Daily  . tamsulosin  0.4 mg Oral Daily  . traZODone  150 mg Oral QHS   Continuous Infusions:   LOS: 28 days    Time spent: 15 mins   Wyvonnia Dusky, MD [facility] Pager [phone]  If 7PM-7AM, please contact night-coverage [email or web address] 04/26/2019, 7:39 AM
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
## 2019-04-27 DIAGNOSIS — F200 Paranoid schizophrenia: Secondary | ICD-10-CM | POA: Diagnosis not present

## 2019-04-27 LAB — CBC
HCT: 30.4 % — ABNORMAL LOW (ref 39.0–52.0)
Hemoglobin: 10.3 g/dL — ABNORMAL LOW (ref 13.0–17.0)
MCH: 30.4 pg (ref 26.0–34.0)
MCHC: 33.9 g/dL (ref 30.0–36.0)
MCV: 89.7 fL (ref 80.0–100.0)
Platelets: 141 10*3/uL — ABNORMAL LOW (ref 150–400)
RBC: 3.39 MIL/uL — ABNORMAL LOW (ref 4.22–5.81)
RDW: 14.1 % (ref 11.5–15.5)
WBC: 6.2 10*3/uL (ref 4.0–10.5)
nRBC: 0 % (ref 0.0–0.2)

## 2019-04-27 LAB — BASIC METABOLIC PANEL
Anion gap: 11 (ref 5–15)
BUN: 42 mg/dL — ABNORMAL HIGH (ref 6–20)
CO2: 32 mmol/L (ref 22–32)
Calcium: 9 mg/dL (ref 8.9–10.3)
Chloride: 101 mmol/L (ref 98–111)
Creatinine, Ser: 1.06 mg/dL (ref 0.61–1.24)
GFR calc Af Amer: >60 mL/min (ref >60)
GFR calc non Af Amer: >60 mL/min (ref >60)
Glucose, Bld: 110 mg/dL — ABNORMAL HIGH (ref 70–99)
Potassium: 3.7 mmol/L (ref 3.5–5.1)
Sodium: 144 mmol/L (ref 135–145)

## 2019-04-27 NOTE — Progress Notes (Signed)
Pt ambulated several times while in room today - from chair to bed, bed to chair and into the bathroom.  Pt also ambulated in the hall and around the nurses station with the charge nurse.  He did well with his rolling walker and standby assist. Noted to have poor safety awareness as he gets up frequently from the bed and chair on his own despite chair/bed alarm going off and call bell being within reach.  Currently resting in bed but sitting up to eating dinner, he is eating with a healthy appetite.  Evening meds tolerated crushed in ice cream.  Bed in the lowest position, bed alarm on and call bell within reach.  Will monitor.

## 2019-04-27 NOTE — Progress Notes (Signed)
PROGRESS NOTE    Peter Parsons  [id number] DOB: 03-01-1960 DOA: 02/11/2019 PCP: System, Provider Not In     Assessment & Plan:   Principal Problem:   Paranoid schizophrenia ([place]) Active Problems:   Altered mental status   Dementia ([place])   Weakness   Acute metabolic encephalopathy   Protein-calorie malnutrition, severe   Pressure injury of skin  Debilitation: PT recs home health w/ 24 hr supervision/assistance  Schizophrenia: w/ frequent suicidal ideation. Continue on paliperidone, klonopin, mirtazapine, risperidone. Does not have decisional capacity, does have a guardian. Psychiatry following and recs apprec   Depression/Bipolar disorder: unknown type and/or severity. Continue on lexapro, trazodone.  Psychiatry following and recs apprec  DM2: uncontrolled. Will continue to hold home dose of metformin  CKDIIIa: GFR is stable. Will continue to monitor periodically   Hypokalemia: WNL today. Will continue to monitor   Thrombocytopenia: etiology unclear. Will continue to monitor   Peripheral neuropathy: continue on gabapentin  BPH: continue Flomax  Dyslipidemia: continue statin  HTN: normotensive off all of his antihypertensives possibly secondary to weight loss. Resolved  Dysphagia, dysarthria, left facial droop: CVA ruled out with MRI. Currently still has some dysphagia, pockets foods, but takes in fluids ok. Continue w/ dysphagia diet   Severe Malnutrition: continue on dietary supplements. Continue on mirtazapine    DVT prophylaxis: lovenox  Code Status: full  Family Communication:  Disposition Plan: CM is still working w/ legal guardian to have pt placed in a group home    Consultants:   Psychiatry    Procedures:    Antimicrobials:    Subjective: Pt is still refusing to answer any questions today   Objective: Vitals:   04/26/19 2009 04/27/19 0114 04/27/19 0413 04/27/19 0500  BP: 126/90 126/85 115/71   Pulse: (!) 105 96 68   Resp: 18 18  18    Temp: 99.1 F (37.3 C) 98.3 F (36.8 C) 98 F (36.7 C)   TempSrc: Oral Oral Oral   SpO2: 96% 97% 97%   Weight:    75.9 kg  Height:        Intake/Output Summary (Last 24 hours) at 04/27/2019 0807 Last data filed at 04/27/2019 0600 Gross per 24 hour  Intake 477 ml  Output 750 ml  Net -273 ml   Filed Weights   02/11/19 1441 03/28/19 0306 04/27/19 0500  Weight: 77.1 kg 70.8 kg 75.9 kg    Examination:  General exam: Appears calm and comfortable  Respiratory system: Clear to auscultation. No accessory muscle use. No rhonchi Cardiovascular system: S1 & S2 +. No rubs, gallops or clicks.  Gastrointestinal system: Abdomen is nondistended, soft and nontender. normal bowel sounds heard. Central nervous system: Alert and awake. Moves all 4 extremities Psychiatry: Judgement and insight appear abnormal. Flat mood and affect    Data Reviewed: I have personally reviewed following labs and imaging studies  CBC: Recent Labs  Lab 04/26/19 0541 04/27/19 0429  WBC 5.5 6.2  HGB 10.6* 10.3*  HCT 30.8* 30.4*  MCV 89.0 89.7  PLT 142* Q000111Q*   Basic Metabolic Panel: Recent Labs  Lab 04/26/19 0541 04/27/19 0429  NA 141 144  K 3.4* 3.7  CL 99 101  CO2 34* 32  GLUCOSE 107* 110*  BUN 42* 42*  CREATININE 0.98 1.06  CALCIUM 8.9 9.0   GFR: Estimated Creatinine Clearance: 74.1 mL/min (by C-G formula based on SCr of 1.06 mg/dL). Liver Function Tests: No results for input(s): AST, ALT, ALKPHOS, BILITOT, PROT, ALBUMIN in the last 168  hours. No results for input(s): LIPASE, AMYLASE in the last 168 hours. No results for input(s): AMMONIA in the last 168 hours. Coagulation Profile: No results for input(s): INR, PROTIME in the last 168 hours. Cardiac Enzymes: No results for input(s): CKTOTAL, CKMB, CKMBINDEX, TROPONINI in the last 168 hours. BNP (last 3 results) No results for input(s): PROBNP in the last 8760 hours. HbA1C: No results for input(s): HGBA1C in the last 72  hours. CBG: No results for input(s): GLUCAP in the last 168 hours. Lipid Profile: No results for input(s): CHOL, HDL, LDLCALC, TRIG, CHOLHDL, LDLDIRECT in the last 72 hours. Thyroid Function Tests: No results for input(s): TSH, T4TOTAL, FREET4, T3FREE, THYROIDAB in the last 72 hours. Anemia Panel: No results for input(s): VITAMINB12, FOLATE, FERRITIN, TIBC, IRON, RETICCTPCT in the last 72 hours. Sepsis Labs: No results for input(s): PROCALCITON, LATICACIDVEN in the last 168 hours.  No results found for this or any previous visit (from the past 240 hour(s)).       Radiology Studies: No results found.      Scheduled Meds: . atorvastatin  10 mg Oral Daily  . chlorhexidine  15 mL Mouth/Throat BID  . collagenase   Topical Daily  . enoxaparin (LOVENOX) injection  40 mg Subcutaneous Q24H  . escitalopram  10 mg Oral Daily  . feeding supplement (NEPRO CARB STEADY)  237 mL Oral TID BM  . feeding supplement (PRO-STAT SUGAR FREE 64)  30 mL Oral BID WC  . gabapentin  300 mg Oral TID  . mirtazapine  45 mg Oral QHS  . multivitamin with minerals  1 tablet Oral Daily  . risperiDONE  3 mg Oral BID  . senna-docusate  1 tablet Oral Daily  . tamsulosin  0.4 mg Oral Daily  . traZODone  150 mg Oral QHS   Continuous Infusions:   LOS: 29 days    Time spent: 15 mins   Wyvonnia Dusky, MD [facility] Pager [phone]  If 7PM-7AM, please contact night-coverage [email or web address] 04/27/2019, 8:07 AM

## 2019-04-27 NOTE — TOC Progression Note (Signed)
Transition of Care [facility]) - Progression Note    Patient Details  Name: Peter Parsons MRN: [id number] Date of Birth: [birth year]  [facility] CM/SW Contact  Shelbie Ammons, RN Phone Number: 04/27/2019, 11:55 AM  Clinical Narrative:   RNCM spoke with Vanna Scotland this morning she is the [place] care co-ordinator for Mr. Colaluca. She is the person who was to start the search for a group home bed for Mr. Ponds given the recommendation that he is now able to return to a group home situation. RNCM last spoke with her on 3/9 and since then has left several messages.   Per Adia she has had a death in the family so that has put her a bit behind, but in the interim she had to get a referral from Mr. Nagy Act team lead Dorene Sorrow which she received this morning. She does however report that there have been no open bed offers. According to her he will now be eligible for a "group living high residential program" and that he is already on the registry for this. RNCM questioned her as to what this CM can do to facilitate moving this placement along and she said that at some point today she will send a list of facilities and we will split calling these facilities.   She did indicate that because he is in this new grouping which basically gives him more services and is paid at a higher funding rate that these beds stay fuller and this makes placement take longer. RNCM did reiterate to her the extreme importance of getting him discharged out of the acute care setting as it is not beneficial and could become detrimental to him to remain inpatient when he is medically stable to leave.       Expected Discharge Plan: Group Home Barriers to Discharge: SNF Pending bed offer  Expected Discharge Plan and Services Expected Discharge Plan: Group Home   Discharge Planning Services: CM Consult   Living arrangements for the past 2 months: Group Home                 DME Arranged: N/A DME  Agency: [employer] Date DME Agency Contacted: 04/12/19 Time DME Agency Contacted: [phone] Representative spoke with at DME Agency: none needed [place] Arranged: NA           Social Determinants of Health ([place]) Interventions    Readmission Risk Interventions Readmission Risk Prevention Plan 04/27/2019 04/20/2019  Transportation Screening Complete Complete  PCP or Specialist Appt within 3-5 Days - Not Complete  Not Complete comments - not ready to [place] or Home Care Consult - Not Complete  HRI or Home Care Consult comments - not ready to[place], looking for a bed for placement  Social Work Consult for [place] Planning/Counseling - [place] - Not Applicable  Medication Review (RN Care Manager) Referral to Pharmacy Referral to Pharmacy  PCP or Specialist appointment within 3-5 days of discharge Complete -  [place] or [place] Complete -  [place] Not Applicable -

## 2019-04-27 NOTE — Progress Notes (Signed)
Nutrition Follow-up  RD working remotely.  DOCUMENTATION CODES:   Severe malnutrition in context of social or environmental circumstances  INTERVENTION:  ContinueNepro Shake poTID, each supplement provides 425 kcal and 19 grams protein  Continue Pro-Stat 30 ml po BID each meals, each supplement provides 100 kcal and 15 grams of protein  Continue daily MVI  NUTRITION DIAGNOSIS:   Severe Malnutrition related to social / environmental circumstances(inadequate oral intake) as evidenced by percent weight loss, moderate fat depletion, moderate muscle depletion, severe muscle depletion.  Ongoing.  GOAL:   Patient will meet greater than or equal to 90% of their needs  Progressing.  MONITOR:   PO intake, Supplement acceptance, Labs, Weight trends, I & O's  REASON FOR ASSESSMENT:   Malnutrition Screening Tool    ASSESSMENT:   59 year old male with PMHx of schizophrenia, HTN, DM who has been in [place] for 44 days and is now admitted with generalized weakness, dysphagia, slurred speech, left facial droop to rule out CVA.  Spoke with patient over the phone. He reports his appetite is improving. He reports he is eating almost 100% of all of his meals now. He still enjoys the Nepro and Pro-Stat and would like to continue these.   Medications reviewed and include: Remeron 45 mg QHS, MVI daily, senna-docusate 1 tablet daily, Flomax.   Labs reviewed: BUN 42.  Weight trend during admission: 77.1 kg on 02/11/2019, 70.8 kg on 03/28/2019, 75.9 kg on 04/27/2019  Diet Order:   Diet Order            DIET - DYS 1 Room service appropriate? Yes with Assist; Fluid consistency: Thin  Diet effective now             EDUCATION NEEDS:   No education needs have been identified at this time  Skin:  Skin Assessment: Skin Integrity Issues:(unstageable to coccyx; DTIs to bilateral heels and right ankle)  Last BM:  04/23/2019 per chart  Height:   Ht Readings from Last 1 Encounters:  03/28/19  5\' 9"  (1.753 m)   Weight:   Wt Readings from Last 1 Encounters:  04/27/19 75.9 kg   Ideal Body Weight:  72.7 kg  BMI:  Body mass index is 24.71 kg/m.  Estimated Nutritional Needs:   Kcal:  2000-2200  Protein:  100-110 grams  Fluid:  >/= 2 L/day  Jacklynn Barnacle, MS, RD, LDN Pager number available on Amion

## 2019-04-27 NOTE — Progress Notes (Signed)
Physical Therapy Treatment Patient Details Name: Peter Parsons MRN: [id number] DOB: 1960-06-30 Today's Date: 04/27/2019    History of Present Illness 59 y.o. male initially presenting to hospital 02/11/19 with Involuntary commitment from group home d/t wandering off into woods multiple times (found near water); pt reporting suicidal thoughts of drowning himself.  Pt tripped on his own feet and hit chin on chair on morning of 2/4 (pt got back up and walked to restroom independently).  Unable to return to group home d/t safety concerns.  Per chart review pt scored 19/30 on [facility] cognitive assessment with psychiatry indicative of moderate cognitive impairment (consistent with pt's diagnosis of dementia).  Pt was in [place] in ED for about 44 days prior to being admitted to hospital 2/13 for generalized weakness, dysphagia, and slurred speech.  MRI negative for acute CVA.  PMH includes DM, htn, schizophrenia.    PT Comments    Pt with distance, flat-affect t/o session, but with minimally increased time to process he was able to participate well with PT. He struggled with some dynamic balance tasks, but showed good persistence with balance exercises and was able to ambulate 350 ft with FWW and no overt LOBs or safety issues.  He had very slow self selected speed, but with light direct cuing to keep walker moving he was able to keep up relatively easily.  Pt's HR into the 130s with standing balance acts in bed, did not drop below 100 bpm with rest; during ambulation HR increased to the 120s from time to time but generally stayed in the 110s and he did not c/o fatigue even with repeated questioning.    Follow Up Recommendations  Home health PT;Supervision/Assistance - 24 hour     Equipment Recommendations  Rolling walker with 5" wheels;3in1 (PT)    Recommendations for Other Services       Precautions / Restrictions Precautions Precautions: Fall Precaution Comments: Suicide precautions; aspiration  precautions; monitor HR Restrictions Weight Bearing Restrictions: No    Mobility  Bed Mobility Overal bed mobility: Modified Independent Bed Mobility: Supine to Sit     Supine to sit: Min guard     General bed mobility comments: Pt was able to get himself to sitting w/o PT intervention,   Transfers Overall transfer level: Needs assistance Equipment used: 1 person hand held assist Transfers: Sit to/from Stand Sit to Stand: Min guard         General transfer comment: Pt able to rise to standing but showed some initial unsteadiness and did need HHA.   Ambulation/Gait Ambulation/Gait assistance: Supervision Gait Distance (Feet): 350 Feet Assistive device: Rolling walker (2 wheeled)       General Gait Details: Pt's self selected speed is quite slow, however with minimal direct assist to advance walker to more normalized pattern he was able to maintain speed relatively well (though quickly reverts to slow, shuffling cadence without direct assist).    Stairs             Wheelchair Mobility    Modified Rankin (Stroke Patients Only)       Balance Overall balance assessment: Needs assistance Sitting-balance support: No upper extremity supported;Feet supported Sitting balance-Leahy Scale: Good     Standing balance support: Bilateral upper extremity supported Standing balance-Leahy Scale: Fair Standing balance comment: Pt able to maintain static stand balance, but did show genral unsteadiness with minimal challenges  Cognition Arousal/Alertness: Awake/alert Behavior During Therapy: Flat affect Overall Cognitive Status: History of cognitive impairments - at baseline                                 General Comments: Increased process/response time.      Exercises Other Exercises Other Exercises: standing balance activities multiple bouts each with faded b/l hand/single finger assist: heel raises 5-10 sec  hold, eyes closed NBOS static standing, eyes closed with min/mod perterbations, SLS and heel-toe ambulation in room, retro ambulation in room    General Comments        Pertinent Vitals/Pain Pain Assessment: No/denies pain    Home Living                      Prior Function            PT Goals (current goals can now be found in the care plan section) Progress towards PT goals: Progressing toward goals    Frequency    Min 2X/week      PT Plan Current plan remains appropriate    Co-evaluation              AM-PAC PT "6 Clicks" Mobility   Outcome Measure  Help needed turning from your back to your side while in a flat bed without using bedrails?: None Help needed moving from lying on your back to sitting on the side of a flat bed without using bedrails?: A Little Help needed moving to and from a bed to a chair (including a wheelchair)?: A Little Help needed standing up from a chair using your arms (e.g., wheelchair or bedside chair)?: A Little Help needed to walk in hospital room?: A Little Help needed climbing 3-5 steps with a railing? : A Little 6 Click Score: 19    End of Session Equipment Utilized During Treatment: Gait belt Activity Tolerance: Patient tolerated treatment well Patient left: in chair;with call bell/phone within reach;with chair alarm set Nurse Communication: Mobility status PT Visit Diagnosis: Other abnormalities of gait and mobility (R26.89);Muscle weakness (generalized) (M62.81);History of falling (Z91.81);Difficulty in walking, not elsewhere classified (R26.2)     Time: [id number] PT Time Calculation (min) (ACUTE ONLY): 26 min  Charges:  $Gait Training: 8-22 mins $Therapeutic Exercise: 8-22 mins                     Kreg Shropshire, DPT 04/27/2019, 5:00 PM

## 2019-04-28 ENCOUNTER — Inpatient Hospital Stay: Payer: Medicaid Other

## 2019-04-28 DIAGNOSIS — F200 Paranoid schizophrenia: Secondary | ICD-10-CM | POA: Diagnosis not present

## 2019-04-28 DIAGNOSIS — F0390 Unspecified dementia without behavioral disturbance: Secondary | ICD-10-CM | POA: Diagnosis not present

## 2019-04-28 DIAGNOSIS — G9341 Metabolic encephalopathy: Secondary | ICD-10-CM | POA: Diagnosis not present

## 2019-04-28 DIAGNOSIS — R410 Disorientation, unspecified: Secondary | ICD-10-CM

## 2019-04-28 LAB — CBC
HCT: 29.7 % — ABNORMAL LOW (ref 39.0–52.0)
Hemoglobin: 10.2 g/dL — ABNORMAL LOW (ref 13.0–17.0)
MCH: 31.1 pg (ref 26.0–34.0)
MCHC: 34.3 g/dL (ref 30.0–36.0)
MCV: 90.5 fL (ref 80.0–100.0)
Platelets: 133 10*3/uL — ABNORMAL LOW (ref 150–400)
RBC: 3.28 MIL/uL — ABNORMAL LOW (ref 4.22–5.81)
RDW: 14.4 % (ref 11.5–15.5)
WBC: 5.4 10*3/uL (ref 4.0–10.5)
nRBC: 0 % (ref 0.0–0.2)

## 2019-04-28 LAB — URINALYSIS, ROUTINE W REFLEX MICROSCOPIC
Bilirubin Urine: NEGATIVE
Glucose, UA: NEGATIVE mg/dL
Ketones, ur: NEGATIVE mg/dL
Leukocytes,Ua: NEGATIVE
Nitrite: NEGATIVE
Protein, ur: NEGATIVE mg/dL
Specific Gravity, Urine: 1.017 (ref 1.005–1.030)
pH: 5 (ref 5.0–8.0)

## 2019-04-28 LAB — BASIC METABOLIC PANEL
Anion gap: 11 (ref 5–15)
BUN: 45 mg/dL — ABNORMAL HIGH (ref 6–20)
CO2: 32 mmol/L (ref 22–32)
Calcium: 9.2 mg/dL (ref 8.9–10.3)
Chloride: 100 mmol/L (ref 98–111)
Creatinine, Ser: 1.09 mg/dL (ref 0.61–1.24)
GFR calc Af Amer: >60 mL/min (ref >60)
GFR calc non Af Amer: >60 mL/min (ref >60)
Glucose, Bld: 114 mg/dL — ABNORMAL HIGH (ref 70–99)
Potassium: 3.2 mmol/L — ABNORMAL LOW (ref 3.5–5.1)
Sodium: 143 mmol/L (ref 135–145)

## 2019-04-28 LAB — MAGNESIUM: Magnesium: 1.9 mg/dL (ref 1.7–2.4)

## 2019-04-28 MED ORDER — IBUPROFEN 400 MG PO TABS
200.0000 mg | ORAL_TABLET | Freq: Four times a day (QID) | ORAL | Status: DC | PRN
  Administered 2019-04-28: 200 mg via ORAL
  Filled 2019-04-28: qty 1

## 2019-04-28 MED ORDER — POTASSIUM CHLORIDE CRYS ER 20 MEQ PO TBCR
40.0000 meq | EXTENDED_RELEASE_TABLET | Freq: Once | ORAL | Status: AC
  Administered 2019-04-28: 40 meq via ORAL
  Filled 2019-04-28: qty 2

## 2019-04-28 NOTE — TOC Progression Note (Signed)
Transition of Care [facility]) - Progression Note    Patient Details  Name: Peter Parsons MRN: [id number] Date of Birth: 07/02/1960  Transition of [facility]) CM/SW Contact  Peter Ammons, RN Phone Number: 04/28/2019, 1:14 PM  Clinical Narrative:  RNCM received list of groups to contact for possible placement from Vanna Scotland with [place].   RNCM contacted [place], Lura Em with number given [phone], phone number no longer works  [employer] contacted [facility] [place], [phone] left HIPAA compliant voicemail for return call.   RNCM contacted [place], [phone] spoke with staff member and was able to leave message for Renford Dills, emailed clinical information to her has well.   RNCM contacted [facility] [phone], Costella Hatcher and left message for return call. Received return call from [place] and after some discussion she reported to this CM that her agency no longer works with group high living residents so she would not be able to assist. However she did report that she would pass this along to some of her colleagues along with contact information.   RNCM contacted [facility], Vernetta Honey [phone] however that number is no longer in service.   RNCM contacted [employer] [phone] and left HIPAA compliant voice mail message.     Expected Discharge Plan: Group Home Barriers to Discharge: SNF Pending bed offer  Expected Discharge Plan and Services Expected Discharge Plan: Group Home   Discharge Planning Services: CM Consult   Living arrangements for the past 2 months: Group Home                 DME Arranged: N/A DME Agency: [employer] Date DME Agency Contacted: 04/12/19 Time DME Agency Contacted: [phone] Representative spoke with at DME Agency: none needed [place] Arranged: NA           Social Determinants of Health ([place]) Interventions    Readmission Risk  Interventions Readmission Risk Prevention Plan 04/27/2019 04/20/2019  Transportation Screening Complete Complete  PCP or Specialist Appt within 3-5 Days - Not Complete  Not Complete comments - not ready to [place] or Home Care Consult - Not Complete  HRI or Home Care Consult comments - not ready to[place], looking for a bed for placement  Social Work Consult for [place] Planning/Counseling - [place] - Not Applicable  Medication Review (RN Care Manager) Referral to Pharmacy Referral to Pharmacy  PCP or Specialist appointment within 3-5 days of discharge Complete -  [place] or [place] Complete -  [place] Not Applicable -

## 2019-04-28 NOTE — Progress Notes (Signed)
PROGRESS NOTE    Peter Parsons  [id number] DOB: Jul 02, 1960 DOA: 02/11/2019 PCP: System, Provider Not In     Assessment & Plan:   Principal Problem:   Paranoid schizophrenia ([place]) Active Problems:   Altered mental status   Dementia ([place])   Weakness   Acute metabolic encephalopathy   Protein-calorie malnutrition, severe   Pressure injury of skin  Debilitation: PT recs home health w/ 24 hr supervision/assistance  Schizophrenia: w/ frequent suicidal ideation. Continue on paliperidone, klonopin, mirtazapine, risperidone. Does not have decisional capacity, does have a guardian. Psychiatry following and recs apprec   Depression/Bipolar disorder: unknown type and/or severity. Continue on lexapro, trazodone.  Psychiatry following   DM2: uncontrolled. continue to hold home dose of metformin  CKDIIIa: GFR is stable. Will continue to monitor periodically   Hypokalemia: WNL today. Will continue to monitor   Thrombocytopenia: etiology unclear. Will continue to monitor   Peripheral neuropathy: continue on gabapentin  BPH: continue Flomax  Dyslipidemia: continue statin  HTN: normotensive off all of his antihypertensives possibly secondary to weight loss. Resolved  Dysphagia, dysarthria, left facial droop: CVA ruled out with MRI. Currently still has some dysphagia, pockets foods, but takes in fluids ok. Continue w/ dysphagia diet   Severe Malnutrition: continue on dietary supplements. Continue on mirtazapine    DVT prophylaxis: lovenox  Code Status: full  Family Communication:  Disposition Plan: CM is still working w/ legal guardian to have pt placed in a group home    Consultants:   Psychiatry    Procedures:    Antimicrobials:    Subjective: Pt is still refusing to answer any questions today.  He is very sleepy but wakes up daily painful/loud verbal stimuli and falls back to sleep very easily.  Barely opens his eyes   Objective: Vitals:   04/28/19 0244  04/28/19 0441 04/28/19 0749 04/28/19 1239  BP:  101/62 110/66 121/76  Pulse:  63 61 61  Resp:  14 18 18   Temp: 99.7 F (37.6 C) 99.4 F (37.4 C) 99.1 F (37.3 C) 98.1 F (36.7 C)  TempSrc: Oral  Oral Oral  SpO2:  95% 97% 98%  Weight:      Height:        Intake/Output Summary (Last 24 hours) at 04/28/2019 1347 Last data filed at 04/28/2019 0515 Gross per 24 hour  Intake --  Output 0 ml  Net 0 ml   Filed Weights   02/11/19 1441 03/28/19 0306 04/27/19 0500  Weight: 77.1 kg 70.8 kg 75.9 kg    Examination:  General exam: Appears calm and comfortable  Respiratory system: Clear to auscultation. No accessory muscle use. No rhonchi Cardiovascular system: S1 & S2 +. No rubs, gallops or clicks.  Gastrointestinal system: Abdomen is nondistended, soft and nontender. normal bowel sounds heard. Central nervous system: Sleepy.  Wakes up to loud verbal and physical stimuli. Moves all 4 extremities Psychiatry: Judgement and insight appear abnormal. Flat mood and affect    Data Reviewed: I have personally reviewed following labs and imaging studies  CBC: Recent Labs  Lab 04/26/19 0541 04/27/19 0429 04/28/19 0526  WBC 5.5 6.2 5.4  HGB 10.6* 10.3* 10.2*  HCT 30.8* 30.4* 29.7*  MCV 89.0 89.7 90.5  PLT 142* 141* Q000111Q*   Basic Metabolic Panel: Recent Labs  Lab 04/26/19 0541 04/27/19 0429 04/28/19 0526  NA 141 144 143  K 3.4* 3.7 3.2*  CL 99 101 100  CO2 34* 32 32  GLUCOSE 107* 110* 114*  BUN 42* 42*  45*  CREATININE 0.98 1.06 1.09  CALCIUM 8.9 9.0 9.2  MG  --   --  1.9         Radiology Studies: DG Chest 2 View  Result Date: 04/28/2019 CLINICAL DATA:  Fever.  Unresponsive. EXAM: CHEST - 2 VIEW COMPARISON:  02/12/2019 FINDINGS: The heart size and mediastinal contours are within normal limits. Both lungs are clear. The visualized skeletal structures are unremarkable. IMPRESSION: No active cardiopulmonary disease. Electronically Signed   By: Kerby Moors M.D.   On:  04/28/2019 10:00        Scheduled Meds: . atorvastatin  10 mg Oral Daily  . chlorhexidine  15 mL Mouth/Throat BID  . collagenase   Topical Daily  . enoxaparin (LOVENOX) injection  40 mg Subcutaneous Q24H  . escitalopram  10 mg Oral Daily  . feeding supplement (NEPRO CARB STEADY)  237 mL Oral TID BM  . feeding supplement (PRO-STAT SUGAR FREE 64)  30 mL Oral BID WC  . gabapentin  300 mg Oral TID  . mirtazapine  45 mg Oral QHS  . multivitamin with minerals  1 tablet Oral Daily  . risperiDONE  3 mg Oral BID  . senna-docusate  1 tablet Oral Daily  . tamsulosin  0.4 mg Oral Daily  . traZODone  150 mg Oral QHS   Continuous Infusions:   LOS: 30 days    Time spent: 15 mins   Max Sane, MD [facility] Pager [phone]  If 7PM-7AM, please contact night-coverage [email or web address] 04/28/2019, 1:47 PM

## 2019-04-28 NOTE — Progress Notes (Signed)
PT Cancellation Note  Patient Details Name: Peter Parsons MRN: [id number] DOB: [birth year]   Cancelled Treatment:    Reason Eval/Treat Not Completed: Patient declined, no reason specified.  Pt resting in bed upon PT arrival.  Therapist said hi to pt and pt immediately said "go away" to therapist.  Therapist attempted to talk with pt and encourage functional mobility but pt closed his eyes and would not talk with therapist or participate in therapy.  Nurse notified.  Will re-attempt PT treatment session at a later date/time.  Leitha Bleak, PT 04/28/19, 1:42 PM

## 2019-04-29 DIAGNOSIS — G9341 Metabolic encephalopathy: Secondary | ICD-10-CM | POA: Diagnosis not present

## 2019-04-29 DIAGNOSIS — R410 Disorientation, unspecified: Secondary | ICD-10-CM | POA: Diagnosis not present

## 2019-04-29 DIAGNOSIS — F200 Paranoid schizophrenia: Secondary | ICD-10-CM | POA: Diagnosis not present

## 2019-04-29 NOTE — Progress Notes (Signed)
PT Cancellation Note  Patient Details Name: Peter Parsons MRN: [id number] DOB: [birth year]   Cancelled Treatment:     PT attempt. Pt refused. Max encouragement for OOB activity however pt unwilling. Pt perseverating on someone going to dollar store for him. Therapist will return later this date if time allows.    Willette Pa 04/29/2019, 10:32 AM

## 2019-04-29 NOTE — Progress Notes (Signed)
Physical Therapy Treatment Patient Details Name: Peter Parsons MRN: [id number] DOB: 1960-12-09 Today's Date: 04/29/2019    History of Present Illness 58 y.o. male initially presenting to hospital 02/11/19 with Involuntary commitment from group home d/t wandering off into woods multiple times (found near water); pt reporting suicidal thoughts of drowning himself.  Pt tripped on his own feet and hit chin on chair on morning of 2/4 (pt got back up and walked to restroom independently).  Unable to return to group home d/t safety concerns.  Per chart review pt scored 19/30 on [facility] cognitive assessment with psychiatry indicative of moderate cognitive impairment (consistent with pt's diagnosis of dementia).  Pt was in [place] in ED for about 44 days prior to being admitted to hospital 2/13 for generalized weakness, dysphagia, and slurred speech.  MRI negative for acute CVA.  PMH includes DM, htn, schizophrenia.    PT Comments    Pt was standing in the middle of his room upon arriving. He has very flat affect but was cooperative with increased time and encouragement. Pt HR 109 bpm prior to ambulation. He was able to ambulate 1 lap in hallway ~ 160 ft without AD however does require gait belt and has 2 occasions of LOB with min assist required to prevent fall. Therapist recommends use of AD when pt is standing. Pt tolerated session well however does have HR increase to 133 bpm during walking. O2 > 94 on room air.Overall pt tolerated session well. He was seated in recliner with BLEs elevated and chair alarm placed. PT will continue to follow per POC progressing as able per pt tolerance.     Follow Up Recommendations  Home health PT;Supervision/Assistance - 24 hour     Equipment Recommendations  Rolling walker with 5" wheels;3in1 (PT)    Recommendations for Other Services       Precautions / Restrictions Precautions Precautions: Fall Precaution Comments: Suicide precautions; aspiration precautions;  monitor HR Restrictions Weight Bearing Restrictions: No    Mobility  Bed Mobility               General bed mobility comments: Deferred. Pt recieved standing in room bathroom at start of session and in room recliner at end of session.  Transfers Overall transfer level: Modified independent Equipment used: None Transfers: Sit to/from Stand Sit to Stand: Supervision         General transfer comment: Pt performs functional transfers in room w/o an adapted device this date. He requires close supervision for safety.  Ambulation/Gait Ambulation/Gait assistance: Min guard Gait Distance (Feet): 160 Feet Assistive device: None(gait belt) Gait Pattern/deviations: Step-through pattern;Scissoring;Narrow base of support Gait velocity: decreased   General Gait Details: pt was able to ambulate one lap around RN station with gait belt and no AD. he does have 2 occasions of min assist required 2/2 to scissoring. several occasions of freezing with HHA to initiate fwd progressing. HR did elevated to 133bpm but oncve seated in recliner recovers to 112bpm by conclusion of session.   Stairs             Wheelchair Mobility    Modified Rankin (Stroke Patients Only)       Balance Overall balance assessment: Needs assistance Sitting-balance support: No upper extremity supported;Feet supported Sitting balance-Leahy Scale: Good Sitting balance - Comments: steady sitting reaching within/outside BOS   Standing balance support: Bilateral upper extremity supported Standing balance-Leahy Scale: Fair Standing balance comment: pt was standing in middle of room without UE support upon arriving. Therapist  recommends pt use AD however pt was able to ambulate without AD. does have two episodes of LOB with min assist required to prevent fall.                            Cognition Arousal/Alertness: Awake/alert Behavior During Therapy: Flat affect Overall Cognitive Status: History  of cognitive impairments - at baseline                                 General Comments: Pt requires increased time to process verbal cues. His is also noted to have difficulty with sequencing steps to therapeutic activity. Consistent multimodal cueing provided throughout session.      Exercises Other Exercises Other Exercises: OT engages pt in therapeutic activity to promote improved sitting balance, activity tolerance, fine motor coordination, sequencing, social skills, and higher level executive functioning this date. Pt requires consistent multimodal cueing and supervision for safety throughout.    General Comments        Pertinent Vitals/Pain Pain Assessment: No/denies pain Faces Pain Scale: No hurt    Home Living                      Prior Function            PT Goals (current goals can now be found in the care plan section) Acute Rehab PT Goals Patient Stated Goal: To get out of the hospital Progress towards PT goals: Progressing toward goals    Frequency    Min 2X/week      PT Plan Current plan remains appropriate    Co-evaluation              AM-PAC PT "6 Clicks" Mobility   Outcome Measure  Help needed turning from your back to your side while in a flat bed without using bedrails?: None Help needed moving from lying on your back to sitting on the side of a flat bed without using bedrails?: A Little Help needed moving to and from a bed to a chair (including a wheelchair)?: A Little Help needed standing up from a chair using your arms (e.g., wheelchair or bedside chair)?: A Little Help needed to walk in hospital room?: A Little Help needed climbing 3-5 steps with a railing? : A Little 6 Click Score: 19    End of Session         PT Visit Diagnosis: Other abnormalities of gait and mobility (R26.89);Muscle weakness (generalized) (M62.81);History of falling (Z91.81);Difficulty in walking, not elsewhere classified (R26.2)      Time: [id number] PT Time Calculation (min) (ACUTE ONLY): 13 min  Charges:  $Gait Training: 8-22 mins                     Julaine Fusi PTA 04/29/19, 4:45 PM

## 2019-04-29 NOTE — Progress Notes (Signed)
Occupational Therapy Treatment Patient Details Name: Peter Parsons MRN: [id number] DOB: 12-18-1960 Today's Date: 04/29/2019    History of present illness 59 y.o. male initially presenting to hospital 02/11/19 with Involuntary commitment from group home d/t wandering off into woods multiple times (found near water); pt reporting suicidal thoughts of drowning himself.  Pt tripped on his own feet and hit chin on chair on morning of 2/4 (pt got back up and walked to restroom independently).  Unable to return to group home d/t safety concerns.  Per chart review pt scored 19/30 on [facility] cognitive assessment with psychiatry indicative of moderate cognitive impairment (consistent with pt's diagnosis of dementia).  Pt was in [place] in ED for about 44 days prior to being admitted to hospital 2/13 for generalized weakness, dysphagia, and slurred speech.  MRI negative for acute CVA.  PMH includes DM, htn, schizophrenia.   OT comments  Mr. Chrisley was seen for OT re-evaluation/treatment on this date. Pt received standing in room bathroom finishing toileting this date. OT provides supervision for safety for remainder of ADL task. RN aware pt OOB and states pt has been getting up consistently w/o assist consistently this date. Pt educated on importance of use of call bell for assistance with OOB mobility. Pt agreeable to participating in therapeutic activity this date. Given options of 3 card games, pt elects to play Go-Fish. Pt requires increased processing time and multimodal cueing t/o session. OT engages pt in therapeutic activity to promote leisure exploration/participation, activity tolerance, sitting balance, fine motor coordination, social skills, and higher level executive functioning this date. Therapist provides consistent cueing and structure throughout to support pt ongoing participation. During the game, pt is observed to poor water appropriately from his room pitcher into a styrofoam cup. He requires increased  time to perform this task, but appears to demonstrate improved [facility] from past sessions.  Pt continues to benefit from skilled OT services to maximize return to PLOF and minimize risk of future falls, injury, caregiver burden, and readmission. POC updated to reflect pt current functional status. Discharge recommendation remains appropriate.    Follow Up Recommendations  Home health OT;Supervision/Assistance - 24 hour    Equipment Recommendations  3 in 1 bedside commode    Recommendations for Other Services      Precautions / Restrictions Precautions Precautions: Fall Precaution Comments: Suicide precautions; aspiration precautions; monitor HR Restrictions Weight Bearing Restrictions: No       Mobility Bed Mobility               General bed mobility comments: Deferred. Pt recieved standing in room bathroom at start of session and in room recliner at end of session.  Transfers Overall transfer level: Modified independent Equipment used: None Transfers: Sit to/from Stand Sit to Stand: Supervision         General transfer comment: Pt performs functional transfers in room w/o an adapted device this date. He requires close supervision for safety.    Balance Overall balance assessment: Needs assistance Sitting-balance support: No upper extremity supported;Feet supported Sitting balance-Leahy Scale: Good Sitting balance - Comments: steady sitting reaching within/outside BOS   Standing balance support: Bilateral upper extremity supported Standing balance-Leahy Scale: Fair                             ADL either performed or assessed with clinical judgement   ADL Overall ADL's : Needs assistance/impaired Eating/Feeding: Modified independent Eating/Feeding Details (indicate cue type and reason):  Pt able to pour water from his tray pitcher into his cup and drinks appropriately without s/s of aspiration this date. Pt continues to require increased time to perform  self feeding/drinking.                     Toilet Transfer: Social worker Details (indicate cue type and reason): Pt recieved in his room bathroom this date. He is observed to perform functional mobility w/o an AD this date, however moves quite slowly and would benefit from use of RW for improved safety/stability. Pt continues to be impulsive, ambulating without assitance/device. Continues to requires close supervision for safety. Toileting- Water quality scientist and Hygiene: Supervision/safety               Vision Patient Visual Report: No change from baseline     Perception     Praxis      Cognition Arousal/Alertness: Awake/alert Behavior During Therapy: Flat affect Overall Cognitive Status: History of cognitive impairments - at baseline                                 General Comments: Pt requires increased time to process verbal cues. His is also noted to have difficulty with sequencing steps to therapeutic activity. Consistent multimodal cueing provided throughout session.        Exercises Other Exercises Other Exercises: OT engages pt in therapeutic activity to promote improved sitting balance, activity tolerance, fine motor coordination, sequencing, social skills, and higher level executive functioning this date. Pt requires consistent multimodal cueing and supervision for safety throughout.   Shoulder Instructions       General Comments      Pertinent Vitals/ Pain       Pain Assessment: No/denies pain  Home Living                                          Prior Functioning/Environment              Frequency  Min 2X/week        Progress Toward Goals  OT Goals(current goals can now be found in the care plan section)  Progress towards OT goals: Progressing toward goals;Goals met and updated - see care plan  Acute Rehab OT Goals Patient Stated Goal: To get out of the  hospital OT Goal Formulation: With patient Time For Goal Achievement: 05/13/19 Potential to Achieve Goals: Good ADL Goals Pt Will Perform Grooming: standing;with supervision(With min cueing for initiation) Pt Will Perform Lower Body Dressing: with supervision;sit to/from stand(With LRAD PRN for improved safety and functional independence.) Pt Will Transfer to Toilet: with supervision;ambulating;regular height toilet;grab bars(With LRAD PRN for improved safety/functional independence.) Additional ADL Goal #1: In two weeks, pt will independently return verbalize/demonstrate understanding of at least 3 falls prevention strategies for home and hospital for improved safety and functional independence upon hospital DC.  Plan Discharge plan remains appropriate    Co-evaluation                 AM-PAC OT "6 Clicks" Daily Activity     Outcome Measure   Help from another person eating meals?: None Help from another person taking care of personal grooming?: A Little Help from another person toileting, which includes using toliet, bedpan, or urinal?: A Little Help from another person bathing (  including washing, rinsing, drying)?: A Lot Help from another person to put on and taking off regular upper body clothing?: A Little Help from another person to put on and taking off regular lower body clothing?: A Lot 6 Click Score: 17    End of Session Equipment Utilized During Treatment: Gait belt  OT Visit Diagnosis: Unsteadiness on feet (R26.81);Muscle weakness (generalized) (M62.81)   Activity Tolerance Patient tolerated treatment well   Patient Left in chair;with chair alarm set   Nurse Communication Mobility status;Other (comment)(Pt participates well in OT session.)        Time: 1421-1500 OT Time Calculation (min): 39 min  Charges: OT General Charges $OT Visit: 1 Visit OT Evaluation $OT Re-eval: 1 Re-eval OT Treatments $Self Care/Home Management : 8-22 mins $Therapeutic  Activity: 23-37 mins  Shara Blazing, M.S., OTR/L Ascom: [phone] 04/29/19, 4:18 PM

## 2019-04-29 NOTE — Progress Notes (Signed)
PROGRESS NOTE    Peter Parsons  [id number] DOB: Aug 16, 1960 DOA: 02/11/2019 PCP: System, Provider Not In     Assessment & Plan:   Principal Problem:   Paranoid schizophrenia ([place]) Active Problems:   Altered mental status   Dementia ([place])   Weakness   Acute metabolic encephalopathy   Protein-calorie malnutrition, severe   Pressure injury of skin  Debilitation: PT recs home health w/ 24 hr supervision/assistance  Schizophrenia: w/ frequent suicidal ideation. Continue on paliperidone, klonopin, mirtazapine, risperidone. Does not have decisional capacity, does have a guardian. Psychiatry following and recs apprec   Depression/Bipolar disorder: unknown type and/or severity. Continue on lexapro, trazodone.  Psychiatry following   DM2: uncontrolled. continue to hold home dose of metformin  CKDIIIa: GFR is stable. continue to monitor periodically   Hypokalemia: WNL today. Will continue to monitor   Thrombocytopenia: etiology unclear. Will continue to monitor   Peripheral neuropathy: continue on gabapentin  BPH: continue Flomax  Dyslipidemia: continue statin  HTN: normotensive off all of his antihypertensives possibly secondary to weight loss. Resolved  Dysphagia, dysarthria, left facial droop: CVA ruled out with MRI. Currently still has some dysphagia, pockets foods, but takes in fluids ok. Continue w/ dysphagia diet   Severe Malnutrition: continue on dietary supplements. Continue on mirtazapine    DVT prophylaxis: lovenox  Code Status: full  Family Communication:  Disposition Plan: CM is still working w/ legal guardian to have pt placed in a group home    Consultants:   Psychiatry     Subjective: Awake, no new issues. Still waiting on placement   Objective: Vitals:   04/28/19 1601 04/28/19 2023 04/28/19 2342 04/29/19 0521  BP: 115/89 114/85 124/85 119/74  Pulse: 97 100 89 73  Resp: 18 17 17    Temp: (!) 97.4 F (36.3 C) 99.6 F (37.6 C) 98.5 F  (36.9 C) 98.1 F (36.7 C)  TempSrc:  Oral Oral Oral  SpO2: 95% 97% 97% 96%  Weight:      Height:        Intake/Output Summary (Last 24 hours) at 04/29/2019 1956 Last data filed at 04/29/2019 1854 Gross per 24 hour  Intake 480 ml  Output --  Net 480 ml   Filed Weights   02/11/19 1441 03/28/19 0306 04/27/19 0500  Weight: 77.1 kg 70.8 kg 75.9 kg    Examination:  General exam: Appears calm and comfortable  Respiratory system: Clear to auscultation. No accessory muscle use. No rhonchi Cardiovascular system: S1 & S2 +. No rubs, gallops or clicks.  Gastrointestinal system: Abdomen is nondistended, soft and nontender. normal bowel sounds heard. Central nervous system: awake. Moves all 4 extremities Psychiatry: Judgement and insight appear abnormal. Flat mood and affect    Data Reviewed: I have personally reviewed following labs and imaging studies  CBC: Recent Labs  Lab 04/26/19 0541 04/27/19 0429 04/28/19 0526  WBC 5.5 6.2 5.4  HGB 10.6* 10.3* 10.2*  HCT 30.8* 30.4* 29.7*  MCV 89.0 89.7 90.5  PLT 142* 141* Q000111Q*   Basic Metabolic Panel: Recent Labs  Lab 04/26/19 0541 04/27/19 0429 04/28/19 0526  NA 141 144 143  K 3.4* 3.7 3.2*  CL 99 101 100  CO2 34* 32 32  GLUCOSE 107* 110* 114*  BUN 42* 42* 45*  CREATININE 0.98 1.06 1.09  CALCIUM 8.9 9.0 9.2  MG  --   --  1.9         Radiology Studies: DG Chest 2 View  Result Date: 04/28/2019 CLINICAL DATA:  Fever.  Unresponsive. EXAM: CHEST - 2 VIEW COMPARISON:  02/12/2019 FINDINGS: The heart size and mediastinal contours are within normal limits. Both lungs are clear. The visualized skeletal structures are unremarkable. IMPRESSION: No active cardiopulmonary disease. Electronically Signed   By: Kerby Moors M.D.   On: 04/28/2019 10:00        Scheduled Meds: . atorvastatin  10 mg Oral Daily  . chlorhexidine  15 mL Mouth/Throat BID  . collagenase   Topical Daily  . enoxaparin (LOVENOX) injection  40 mg  Subcutaneous Q24H  . escitalopram  10 mg Oral Daily  . feeding supplement (NEPRO CARB STEADY)  237 mL Oral TID BM  . feeding supplement (PRO-STAT SUGAR FREE 64)  30 mL Oral BID WC  . gabapentin  300 mg Oral TID  . mirtazapine  45 mg Oral QHS  . multivitamin with minerals  1 tablet Oral Daily  . risperiDONE  3 mg Oral BID  . senna-docusate  1 tablet Oral Daily  . tamsulosin  0.4 mg Oral Daily  . traZODone  150 mg Oral QHS   Continuous Infusions:   LOS: 31 days    Time spent: 15 mins   Max Sane, MD [facility] Pager [phone]  If 7PM-7AM, please contact night-coverage [email or web address] 04/29/2019, 7:56 PM

## 2019-04-30 DIAGNOSIS — F200 Paranoid schizophrenia: Secondary | ICD-10-CM | POA: Diagnosis not present

## 2019-04-30 DIAGNOSIS — G9341 Metabolic encephalopathy: Secondary | ICD-10-CM | POA: Diagnosis not present

## 2019-04-30 DIAGNOSIS — F0390 Unspecified dementia without behavioral disturbance: Secondary | ICD-10-CM | POA: Diagnosis not present

## 2019-04-30 DIAGNOSIS — R410 Disorientation, unspecified: Secondary | ICD-10-CM | POA: Diagnosis not present

## 2019-04-30 LAB — BASIC METABOLIC PANEL
Anion gap: 12 (ref 5–15)
BUN: 44 mg/dL — ABNORMAL HIGH (ref 6–20)
CO2: 31 mmol/L (ref 22–32)
Calcium: 9 mg/dL (ref 8.9–10.3)
Chloride: 99 mmol/L (ref 98–111)
Creatinine, Ser: 1.14 mg/dL (ref 0.61–1.24)
GFR calc Af Amer: >60 mL/min (ref >60)
GFR calc non Af Amer: >60 mL/min (ref >60)
Glucose, Bld: 153 mg/dL — ABNORMAL HIGH (ref 70–99)
Potassium: 3.3 mmol/L — ABNORMAL LOW (ref 3.5–5.1)
Sodium: 142 mmol/L (ref 135–145)

## 2019-04-30 NOTE — Progress Notes (Signed)
PROGRESS NOTE    Sherril Mazar  [id number] DOB: 06-07-1960 DOA: 02/11/2019 PCP: System, Provider Not In     Assessment & Plan:   Principal Problem:   Paranoid schizophrenia ([place]) Active Problems:   Altered mental status   Dementia ([place])   Weakness   Acute metabolic encephalopathy   Protein-calorie malnutrition, severe   Pressure injury of skin  Debilitation: PT recs home health w/ 24 hr supervision/assistance  Schizophrenia: w/ frequent suicidal ideation. Continue on paliperidone, klonopin, mirtazapine, risperidone. Does not have decisional capacity, does have a guardian. Psychiatry seen   Depression/Bipolar disorder: unknown type and/or severity. Continue on lexapro, trazodone.  Psychiatry seen   DM2: uncontrolled. continue to hold home dose of metformin  CKDIIIa: GFR is stable. continue to monitor periodically   Hypokalemia: WNL today. Will continue to monitor   Thrombocytopenia: etiology unclear. Will continue to monitor   Peripheral neuropathy: continue on gabapentin  BPH: continue Flomax  Dyslipidemia: continue statin  HTN: normotensive off all of his antihypertensives possibly secondary to weight loss. Resolved  Dysphagia, dysarthria, left facial droop: CVA ruled out with MRI. Currently still has some dysphagia, pockets foods, but takes in fluids ok. Continue w/ dysphagia diet   Severe Malnutrition: continue on dietary supplements. Continue on mirtazapine    DVT prophylaxis: lovenox  Code Status: full  Family Communication:  Disposition Plan: CM is still working w/ legal guardian to have pt placed in a group home    Consultants:   Psychiatry     Subjective: Awake, no new issues.  waiting on placement   Objective: Vitals:   04/28/19 2342 04/29/19 0521 04/29/19 2323 04/30/19 0803  BP: 124/85 119/74 99/63 119/79  Pulse: 89 73 78 95  Resp: 17  18 18   Temp: 98.5 F (36.9 C) 98.1 F (36.7 C) 98.3 F (36.8 C) 99.3 F (37.4 C)    TempSrc: Oral Oral  Oral  SpO2: 97% 96% 96% 96%  Weight:      Height:        Intake/Output Summary (Last 24 hours) at 04/30/2019 1555 Last data filed at 04/30/2019 1247 Gross per 24 hour  Intake 240 ml  Output 1700 ml  Net -1460 ml   Filed Weights   02/11/19 1441 03/28/19 0306 04/27/19 0500  Weight: 77.1 kg 70.8 kg 75.9 kg    Examination:  General exam: Appears calm and comfortable  Respiratory system: Clear to auscultation. No accessory muscle use. No rhonchi Cardiovascular system: S1 & S2 +. No rubs, gallops or clicks.  Gastrointestinal system: Abdomen is nondistended, soft and nontender. normal bowel sounds heard. Central nervous system: awake. Moves all 4 extremities Psychiatry: Judgement and insight appear abnormal. Flat mood and affect    Data Reviewed: I have personally reviewed following labs and imaging studies  CBC: Recent Labs  Lab 04/26/19 0541 04/27/19 0429 04/28/19 0526  WBC 5.5 6.2 5.4  HGB 10.6* 10.3* 10.2*  HCT 30.8* 30.4* 29.7*  MCV 89.0 89.7 90.5  PLT 142* 141* Q000111Q*   Basic Metabolic Panel: Recent Labs  Lab 04/26/19 0541 04/27/19 0429 04/28/19 0526 04/30/19 0348  NA 141 144 143 142  K 3.4* 3.7 3.2* 3.3*  CL 99 101 100 99  CO2 34* 32 32 31  GLUCOSE 107* 110* 114* 153*  BUN 42* 42* 45* 44*  CREATININE 0.98 1.06 1.09 1.14  CALCIUM 8.9 9.0 9.2 9.0  MG  --   --  1.9  --          Radiology Studies:  No results found.      Scheduled Meds: . atorvastatin  10 mg Oral Daily  . chlorhexidine  15 mL Mouth/Throat BID  . collagenase   Topical Daily  . enoxaparin (LOVENOX) injection  40 mg Subcutaneous Q24H  . escitalopram  10 mg Oral Daily  . feeding supplement (NEPRO CARB STEADY)  237 mL Oral TID BM  . feeding supplement (PRO-STAT SUGAR FREE 64)  30 mL Oral BID WC  . gabapentin  300 mg Oral TID  . mirtazapine  45 mg Oral QHS  . multivitamin with minerals  1 tablet Oral Daily  . risperiDONE  3 mg Oral BID  . senna-docusate  1  tablet Oral Daily  . tamsulosin  0.4 mg Oral Daily  . traZODone  150 mg Oral QHS   Continuous Infusions:   LOS: 32 days    Time spent: 15 mins   Max Sane, MD [facility] Pager [phone]  If 7PM-7AM, please contact night-coverage [email or web address] 04/30/2019, 3:55 PM

## 2019-05-01 DIAGNOSIS — E43 Unspecified severe protein-calorie malnutrition: Secondary | ICD-10-CM | POA: Diagnosis not present

## 2019-05-01 DIAGNOSIS — R410 Disorientation, unspecified: Secondary | ICD-10-CM | POA: Diagnosis not present

## 2019-05-01 DIAGNOSIS — G9341 Metabolic encephalopathy: Secondary | ICD-10-CM | POA: Diagnosis not present

## 2019-05-01 DIAGNOSIS — F200 Paranoid schizophrenia: Secondary | ICD-10-CM | POA: Diagnosis not present

## 2019-05-01 NOTE — Progress Notes (Signed)
PROGRESS NOTE    Peter Parsons  [id number] DOB: 01/09/1961 DOA: 02/11/2019 PCP: System, Provider Not In     Assessment & Plan:   Principal Problem:   Paranoid schizophrenia ([place]) Active Problems:   Altered mental status   Dementia ([place])   Weakness   Acute metabolic encephalopathy   Protein-calorie malnutrition, severe   Pressure injury of skin  Debilitation: PT recs home health w/ 24 hr supervision/assistance  Schizophrenia: w/ frequent suicidal ideation. Continue on paliperidone, klonopin, mirtazapine, risperidone. Does not have decisional capacity, does have a guardian. Psychiatry seen   Depression/Bipolar disorder: continue lexapro, trazodone.  Psychiatry seen   DM2: uncontrolled. continue to hold home dose of metformin  CKDIIIa: GFR is stable. continue to monitor periodically.  We will check labs in the morning tomorrow  Hypokalemia: WNL today. Will continue to monitor   Thrombocytopenia: etiology unclear. Will continue to monitor   Peripheral neuropathy: continue on gabapentin  BPH: continue Flomax  Dyslipidemia: continue statin  HTN: normotensive off all of his antihypertensives possibly secondary to weight loss. Resolved  Dysphagia, dysarthria, left facial droop: CVA ruled out with MRI. Currently still has some dysphagia, pockets foods, but takes in fluids ok. Continue w/ dysphagia diet   Severe Malnutrition: continue on dietary supplements. Continue on mirtazapine    DVT prophylaxis: lovenox  Code Status: full  Family Communication:  Disposition Plan: CM is still working w/ legal guardian to have pt placed in a group home    Consultants:   Psychiatry     Subjective: Remains same.  Waiting on placement   Objective: Vitals:   04/29/19 2323 04/30/19 0803 04/30/19 1636 05/01/19 0749  BP: 99/63 119/79 129/73 113/77  Pulse: 78 95 81 85  Resp: 18 18 18 16   Temp: 98.3 F (36.8 C) 99.3 F (37.4 C) 99.4 F (37.4 C) 99 F (37.2 C)    TempSrc:  Oral Oral Oral  SpO2: 96% 96% 96% 95%  Weight:      Height:        Intake/Output Summary (Last 24 hours) at 05/01/2019 1142 Last data filed at 05/01/2019 0900 Gross per 24 hour  Intake 540 ml  Output 700 ml  Net -160 ml   Filed Weights   02/11/19 1441 03/28/19 0306 04/27/19 0500  Weight: 77.1 kg 70.8 kg 75.9 kg    Examination:  General exam: Appears calm and comfortable  Respiratory system: Clear to auscultation. No accessory muscle use. No rhonchi Cardiovascular system: S1 & S2 +. No rubs, gallops or clicks.  Gastrointestinal system: Abdomen is nondistended, soft and nontender. normal bowel sounds heard. Central nervous system: awake. Moves all 4 extremities Psychiatry: Judgement and insight appear abnormal. Flat mood and affect    Data Reviewed: I have personally reviewed following labs and imaging studies  CBC: Recent Labs  Lab 04/26/19 0541 04/27/19 0429 04/28/19 0526  WBC 5.5 6.2 5.4  HGB 10.6* 10.3* 10.2*  HCT 30.8* 30.4* 29.7*  MCV 89.0 89.7 90.5  PLT 142* 141* [id number]*   Basic Metabolic Panel: Recent Labs  Lab 04/26/19 0541 04/27/19 0429 04/28/19 0526 04/30/19 0348  NA 141 144 143 142  K 3.4* 3.7 3.2* 3.3*  CL 99 101 100 99  CO2 34* 32 32 31  GLUCOSE 107* 110* 114* 153*  BUN 42* 42* 45* 44*  CREATININE 0.98 1.06 1.09 1.14  CALCIUM 8.9 9.0 9.2 9.0  MG  --   --  1.9  --          Radiology  Studies: No results found.      Scheduled Meds: . atorvastatin  10 mg Oral Daily  . chlorhexidine  15 mL Mouth/Throat BID  . collagenase   Topical Daily  . enoxaparin (LOVENOX) injection  40 mg Subcutaneous Q24H  . escitalopram  10 mg Oral Daily  . feeding supplement (NEPRO CARB STEADY)  237 mL Oral TID BM  . feeding supplement (PRO-STAT SUGAR FREE 64)  30 mL Oral BID WC  . gabapentin  300 mg Oral TID  . mirtazapine  45 mg Oral QHS  . multivitamin with minerals  1 tablet Oral Daily  . risperiDONE  3 mg Oral BID  . senna-docusate  1  tablet Oral Daily  . tamsulosin  0.4 mg Oral Daily  . traZODone  150 mg Oral QHS   Continuous Infusions:   LOS: 33 days    Time spent: 15 mins   Max Sane, MD [facility] Pager [phone]  If 7PM-7AM, please contact night-coverage [email or web address] 05/01/2019, 11:42 AM

## 2019-05-01 NOTE — Progress Notes (Signed)
Patient stated "put me out of my misery", "please help me to die". MD made aware and ordered psych consult follow up.

## 2019-05-01 NOTE — Progress Notes (Signed)
Patient ambulated to chair at this time. Front wheel walker used and tolerated well with encouragement.

## 2019-05-02 DIAGNOSIS — F0390 Unspecified dementia without behavioral disturbance: Secondary | ICD-10-CM | POA: Diagnosis not present

## 2019-05-02 DIAGNOSIS — R410 Disorientation, unspecified: Secondary | ICD-10-CM | POA: Diagnosis not present

## 2019-05-02 DIAGNOSIS — G9341 Metabolic encephalopathy: Secondary | ICD-10-CM | POA: Diagnosis not present

## 2019-05-02 DIAGNOSIS — F200 Paranoid schizophrenia: Secondary | ICD-10-CM | POA: Diagnosis not present

## 2019-05-02 LAB — BASIC METABOLIC PANEL
Anion gap: 10 (ref 5–15)
BUN: 40 mg/dL — ABNORMAL HIGH (ref 6–20)
CO2: 33 mmol/L — ABNORMAL HIGH (ref 22–32)
Calcium: 9.3 mg/dL (ref 8.9–10.3)
Chloride: 99 mmol/L (ref 98–111)
Creatinine, Ser: 1.17 mg/dL (ref 0.61–1.24)
GFR calc Af Amer: >60 mL/min (ref >60)
GFR calc non Af Amer: >60 mL/min (ref >60)
Glucose, Bld: 102 mg/dL — ABNORMAL HIGH (ref 70–99)
Potassium: 3.6 mmol/L (ref 3.5–5.1)
Sodium: 142 mmol/L (ref 135–145)

## 2019-05-02 LAB — CBC
HCT: 32.2 % — ABNORMAL LOW (ref 39.0–52.0)
Hemoglobin: 10.8 g/dL — ABNORMAL LOW (ref 13.0–17.0)
MCH: 30.5 pg (ref 26.0–34.0)
MCHC: 33.5 g/dL (ref 30.0–36.0)
MCV: 91 fL (ref 80.0–100.0)
Platelets: 146 10*3/uL — ABNORMAL LOW (ref 150–400)
RBC: 3.54 MIL/uL — ABNORMAL LOW (ref 4.22–5.81)
RDW: 14.1 % (ref 11.5–15.5)
WBC: 8 10*3/uL (ref 4.0–10.5)
nRBC: 0 % (ref 0.0–0.2)

## 2019-05-02 NOTE — Progress Notes (Signed)
PROGRESS NOTE    Peter Parsons  [id number] DOB: May 28, 1960 DOA: 02/11/2019 PCP: System, Provider Not In     Assessment & Plan:   Principal Problem:   Paranoid schizophrenia ([place]) Active Problems:   Altered mental status   Dementia ([place])   Weakness   Acute metabolic encephalopathy   Protein-calorie malnutrition, severe   Pressure injury of skin  Debilitation: PT recs home health w/ 24 hr supervision/assistance  Schizophrenia: w/ frequent suicidal ideation. Continue on paliperidone, klonopin, mirtazapine, risperidone. Does not have decisional capacity, does have a guardian. Psychiatry requested to follow-up  Depression/Bipolar disorder: continue lexapro, trazodone.  Psychiatry was requested to follow-up patient expressed suicidal ideation to the nurse. Psych will see him today  DM2: uncontrolled. continue to hold home dose of metformin  CKDIIIa: GFR is stable. continue to monitor periodically.  We will check labs in the morning tomorrow  Hypokalemia: WNL today. Will continue to monitor   Thrombocytopenia: etiology unclear. Will continue to monitor   Peripheral neuropathy: continue on gabapentin  BPH: continue Flomax  Dyslipidemia: continue statin  HTN: normotensive off all of his antihypertensives possibly secondary to weight loss. Resolved  Dysphagia, dysarthria, left facial droop: CVA ruled out with MRI. Currently still has some dysphagia, pockets foods, but takes in fluids ok. Continue w/ dysphagia diet   Severe Malnutrition: continue on dietary supplements. Continue on mirtazapine    DVT prophylaxis: lovenox  Code Status: full  Family Communication:  Disposition Plan: CM is still working w/ legal guardian to have pt placed in a group home    Consultants:   Psychiatry     Subjective: He expressed some suicidal ideation to the nurse last evening.  Looks comfortable this morning   Objective: Vitals:   05/02/19 0038 05/02/19 0632 05/02/19 0800  05/02/19 1140  BP: 105/67 117/68 108/70 115/80  Pulse: 75 65 (!) 57 92  Resp: 17 17 18 18   Temp: 99 F (37.2 C) 98.5 F (36.9 C) 99 F (37.2 C) 98.7 F (37.1 C)  TempSrc: Oral Oral Oral Oral  SpO2: 96% 98% 97% 96%  Weight:      Height:       No intake or output data in the 24 hours ending 05/02/19 1230 Filed Weights   02/11/19 1441 03/28/19 0306 04/27/19 0500  Weight: 77.1 kg 70.8 kg 75.9 kg    Examination:  General exam: Appears calm and comfortable  Respiratory system: Clear to auscultation. No accessory muscle use. No rhonchi Cardiovascular system: S1 & S2 +. No rubs, gallops or clicks.  Gastrointestinal system: Abdomen is nondistended, soft and nontender. normal bowel sounds heard. Central nervous system: awake. Moves all 4 extremities Psychiatry: Judgement and insight appear abnormal. Flat mood and affect    Data Reviewed: I have personally reviewed following labs and imaging studies  CBC: Recent Labs  Lab 04/26/19 0541 04/27/19 0429 04/28/19 0526 05/02/19 0624  WBC 5.5 6.2 5.4 8.0  HGB 10.6* 10.3* 10.2* 10.8*  HCT 30.8* 30.4* 29.7* 32.2*  MCV 89.0 89.7 90.5 91.0  PLT 142* 141* 133* [id number]*   Basic Metabolic Panel: Recent Labs  Lab 04/26/19 0541 04/27/19 0429 04/28/19 0526 04/30/19 0348 05/02/19 0624  NA 141 144 143 142 142  K 3.4* 3.7 3.2* 3.3* 3.6  CL 99 101 100 99 99  CO2 34* 32 32 31 33*  GLUCOSE 107* 110* 114* 153* 102*  BUN 42* 42* 45* 44* 40*  CREATININE 0.98 1.06 1.09 1.14 1.17  CALCIUM 8.9 9.0 9.2 9.0 9.3  MG  --   --  1.9  --   --          Radiology Studies: No results found.      Scheduled Meds: . atorvastatin  10 mg Oral Daily  . chlorhexidine  15 mL Mouth/Throat BID  . collagenase   Topical Daily  . enoxaparin (LOVENOX) injection  40 mg Subcutaneous Q24H  . escitalopram  10 mg Oral Daily  . feeding supplement (NEPRO CARB STEADY)  237 mL Oral TID BM  . feeding supplement (PRO-STAT SUGAR FREE 64)  30 mL Oral BID WC  .  gabapentin  300 mg Oral TID  . mirtazapine  45 mg Oral QHS  . multivitamin with minerals  1 tablet Oral Daily  . risperiDONE  3 mg Oral BID  . senna-docusate  1 tablet Oral Daily  . tamsulosin  0.4 mg Oral Daily  . traZODone  150 mg Oral QHS   Continuous Infusions:   LOS: 34 days    Time spent: 15 mins   Max Sane, MD [facility] Pager [phone]  If 7PM-7AM, please contact night-coverage [email or web address] 05/02/2019, 12:30 PM

## 2019-05-02 NOTE — Consult Note (Signed)
[facility]iatry Consult   Reason for Consult:  Suicidal ideations Referring Physician:  Dr. Manuella Ghazi Patient Identification: Peter Parsons MRN:  [id number] Principal Diagnosis: Paranoid schizophrenia [facility]) Diagnosis:  Principal Problem:   Paranoid schizophrenia ([place]) Active Problems:   Altered mental status   Dementia ([place])   Weakness   Acute metabolic encephalopathy   Protein-calorie malnutrition, severe   Pressure injury of skin   Total Time spent with patient: 30 minutes  Subjective:   Peter Parsons is a 59 y.o. male patient admitted with HTN, dyslipidemia, BPH, Schizophrenia, depression, DM2, CKD IIIa, and hypokalemia. Patient reports today that he feels a little confused but still knows who he is and where he is at. He states that he has had some suicidal ideations from time to time while in the hospital. He denies having a plan or wanting to die. He states that the biggest problem is that he has been in the hospital for so long. He continues to report that he has a history of having chronic suicidal ideations that come and go. He is again asked if he has a plan and he continues to deny and states that he doesn't want to die. Patient is hoping placement will be found soon.  HPI:  Patient is a 59 year old male with a history of schizophrenia and dementia, recent MOCA score of 19/30, who was admitted due to weakness.  Patient had formally evaluate in the [facility] for approximately a month and a half during which time he slowly declined and grew weaker until he met criteria for hospitalization.  Patient had been a placement issue due to his social situation.  Patient with schizophrenia most of his life,, raised by his parents who are now elderly with dementia themselves and unable to care for the patient in the home.  During his time in the [place] patient showed increased anxiety and was prescribed Klonopin on a as needed basis.  He also started to show some symptoms of depression and was  started on a low-dose of an antidepressant medication.   Patient is seen by this provider face-to-face and I have consulted with Dr. Mallie Darting. He presents in the bed awake and alert. His speech is slow and he has constricted affect. He does report some suicidal thoughts form time to time but relates it to being stuck in the hospital for 79 days. He denies any active thoughts of a plan or intent and remains calm in the bed and based on chart review presents the same as last consult. He has continued his medications and does not appear to be responding to any internal stimuli. It is thought the patient may be lonely in his room, but does not appear to ne a threat to himself or anyone else. Patient continues to not meet any inpatient psychiatric treatment.Dr. Manuella Ghazi has been notified of the recommendations.  Past Psychiatric History: Patient has a long history of schizophrenia and for many many years lived with his parents.  They are now getting to be too elderly to take care of him of course which has resulted in a crisis as he has moved into group homes.  He has a difficult time adapting to that.  Risk to Self: Suicidal Ideation: Yes-Currently Present Suicidal Intent: No Is patient at risk for suicide?: No, but patient needs Medical Clearance Suicidal Plan?: Yes-Currently Present Specify Current Suicidal Plan: Drown his self Access to Means: No Specify Access to Suicidal Means: Drown self What has been your use of drugs/alcohol within the  last 12 months?: Reports of none How many times?: 1 Other Self Harm Risks: Reports of none Triggers for Past Attempts: Other (Comment)(Mental Illness) Intentional Self Injurious Behavior: None Risk to Others: Homicidal Ideation: No Thoughts of Harm to Others: No Current Homicidal Intent: No Current Homicidal Plan: No Access to Homicidal Means: No Identified Victim: Reports of none History of harm to others?: No Assessment of Violence: None Noted Violent Behavior  Description: Reports of none Does patient have access to weapons?: No Criminal Charges Pending?: No Describe Pending Criminal Charges: Reports of none Does patient have a court date: No Prior Inpatient Therapy: Prior Inpatient Therapy: Yes Prior Therapy Dates: 01/2019, 12/2018 & 05/2018 Prior Therapy Facilty/Provider(s): [facility] [place] [facility] Reason for Treatment: Schizophrenia Prior Outpatient Therapy: Prior Outpatient Therapy: Yes Prior Therapy Dates: Current Prior Therapy Facilty/Provider(s): [facility] Reason for Treatment: Schizophrenia([facility]) Does patient have an ACCT team?: Yes Does patient have Intensive In-House Services?  : No Does patient have [facility] services? : No Does patient have P4CC services?: No  Past Medical History:  Past Medical History:  Diagnosis Date  . Diabetes mellitus without complication ([place])   . Hypertension   . Schizophrenia ([place])    History reviewed. No pertinent surgical history. Family History: History reviewed. No pertinent family history. Family Psychiatric  History: See previous Social History:  Social History   Substance and Sexual Activity  Alcohol Use Not Currently     Social History   Substance and Sexual Activity  Drug Use Not Currently    Social History   Socioeconomic History  . Marital status: Single    Spouse name: Not on file  . Number of children: Not on file  . Years of education: Not on file  . Highest education level: Not on file  Occupational History  . Not on file  Tobacco Use  . Smoking status: Former Research scientist (life sciences)  . Smokeless tobacco: Never Used  Substance and Sexual Activity  . Alcohol use: Not Currently  . Drug use: Not Currently  . Sexual activity: Not on file  Other Topics Concern  . Not on file  Social History Narrative  . Not on file   Social Determinants of Health   Financial Resource Strain:   . Difficulty of Paying Living Expenses:   Food  Insecurity:   . Worried About Charity fundraiser in the Last Year:   . Arboriculturist in the Last Year:   Transportation Needs:   . Film/video editor (Medical):   Marland Kitchen Lack of Transportation (Non-Medical):   Physical Activity:   . Days of Exercise per Week:   . Minutes of Exercise per Session:   Stress:   . Feeling of Stress :   Social Connections:   . Frequency of Communication with Friends and Family:   . Frequency of Social Gatherings with Friends and Family:   . Attends Religious Services:   . Active Member of Clubs or Organizations:   . Attends Archivist Meetings:   Marland Kitchen Marital Status:    Additional Social History:    Allergies:   Allergies  Allergen Reactions  . Acetaminophen Other (See Comments)    Unknown Unable to breathe Chest  tightness/SOB     Labs:  Results for orders placed or performed during the hospital encounter of 02/11/19 (from the past 48 hour(s))  CBC     Status: Abnormal   Collection Time: 05/02/19  6:24 AM  Result Value  Ref Range   WBC 8.0 4.0 - 10.5 K/uL   RBC 3.54 (L) 4.22 - 5.81 MIL/uL   Hemoglobin 10.8 (L) 13.0 - 17.0 g/dL   HCT 32.2 (L) 39.0 - 52.0 %   MCV 91.0 80.0 - 100.0 fL   MCH 30.5 26.0 - 34.0 pg   MCHC 33.5 30.0 - 36.0 g/dL   RDW 14.1 11.5 - 15.5 %   Platelets 146 (L) 150 - 400 K/uL   nRBC 0.0 0.0 - 0.2 %    Comment: Performed at [facility], [address], [place], [place] [address]  Basic metabolic panel     Status: Abnormal   Collection Time: 05/02/19  6:24 AM  Result Value Ref Range   Sodium 142 135 - 145 mmol/L   Potassium 3.6 3.5 - 5.1 mmol/L   Chloride 99 98 - 111 mmol/L   CO2 33 (H) 22 - 32 mmol/L   Glucose, Bld 102 (H) 70 - 99 mg/dL    Comment: Glucose reference range applies only to samples taken after fasting for at least 8 hours.   BUN 40 (H) 6 - 20 mg/dL   Creatinine, Ser 1.17 0.61 - 1.24 mg/dL   Calcium 9.3 8.9 - 10.3 mg/dL   GFR calc non Af Amer >60 >60 mL/min   GFR calc Af Amer  >60 >60 mL/min   Anion gap 10 5 - 15    Comment: Performed at [facility], [address] [place], [place] [address]    Current Facility-Administered Medications  Medication Dose Route Frequency Provider Last Rate Last Admin  . atorvastatin (LIPITOR) tablet 10 mg  10 mg Oral Daily Carrie Mew, MD   10 mg at 05/02/19 6269  . chlorhexidine (PERIDEX) 0.12 % solution 15 mL  15 mL Mouth/Throat BID Lavina Hamman, MD   15 mL at 05/02/19 0919  . clonazePAM (KLONOPIN) tablet 0.25 mg  0.25 mg Oral TID PRN Dixie Dials, MD   0.25 mg at 05/01/19 1603  . collagenase (SANTYL) ointment   Topical Daily Vashti Hey, MD   Given at 05/02/19 [phone]  . dextrose 50 % solution 50 mL  1 ampule Intravenous PRN Lavina Hamman, MD      . enoxaparin (LOVENOX) injection 40 mg  40 mg Subcutaneous Q24H Lavina Hamman, MD   40 mg at 05/02/19 0631  . escitalopram (LEXAPRO) tablet 10 mg  10 mg Oral Daily Cristofano, Dorene Ar, MD   10 mg at 05/02/19 0923  . feeding supplement (NEPRO CARB STEADY) liquid 237 mL  237 mL Oral TID BM Enzo Bi, MD 0 mL/hr at 04/26/19 2030 237 mL at 05/02/19 0931  . feeding supplement (PRO-STAT SUGAR FREE 64) liquid 30 mL  30 mL Oral BID WC Enzo Bi, MD   30 mL at 05/02/19 0924  . gabapentin (NEURONTIN) capsule 300 mg  300 mg Oral TID Carrie Mew, MD   300 mg at 05/02/19 0925  . ibuprofen (ADVIL) tablet 200 mg  200 mg Oral Q6H PRN Lang Snow, NP   200 mg at 04/28/19 0037  . metoprolol tartrate (LOPRESSOR) injection 5 mg  5 mg Intravenous Q12H PRN Wyvonnia Dusky, MD      . mirtazapine (REMERON) tablet 45 mg  45 mg Oral QHS Clapacs, Madie Reno, MD   45 mg at 05/01/19 2327  . multivitamin with minerals tablet 1 tablet  1 tablet Oral Daily Cristofano, Dorene Ar, MD   1 tablet at 05/02/19  2641  . risperiDONE (RISPERDAL) tablet 3 mg  3 mg Oral BID Carrie Mew, MD   3 mg at 05/02/19 5830  . senna-docusate (Senokot-S) tablet 1 tablet  1 tablet Oral  QHS PRN Mansy, Arvella Merles, MD   1 tablet at 04/15/19 2229  . senna-docusate (Senokot-S) tablet 1 tablet  1 tablet Oral Daily Enzo Bi, MD   1 tablet at 05/02/19 [phone]  . tamsulosin (FLOMAX) capsule 0.4 mg  0.4 mg Oral Daily Carrie Mew, MD   0.4 mg at 05/02/19 0920  . traZODone (DESYREL) tablet 150 mg  150 mg Oral QHS Carrie Mew, MD   150 mg at 05/01/19 2327    Musculoskeletal: Strength & Muscle Tone: within normal limits Gait & Station: normal Patient leans: N/A  Psychiatric Specialty Exam: Physical Exam  Nursing note and vitals reviewed. Constitutional: He appears well-developed and well-nourished.  Cardiovascular: Normal rate.  Respiratory: Effort normal.  Musculoskeletal:        General: Normal range of motion.  Neurological: He is alert.  Skin: Skin is warm.    Review of Systems  Constitutional: Negative.   Eyes: Negative.   Respiratory: Negative.   Endocrine: Negative.   Psychiatric/Behavioral: Positive for dysphoric mood and suicidal ideas (thoughts, but no intent or plan).    Blood pressure 115/80, pulse 92, temperature 98.7 F (37.1 C), temperature source Oral, resp. rate 18, height _0  (1.753 m), weight 75.9 kg, SpO2 96 %.Body mass index is 24.71 kg/m.  General Appearance: Disheveled  Eye Contact:  Good  Speech:  Slow  Volume:  Increased  Mood:  Dysphoric  Affect:  Constricted  Thought Process:  Coherent and Descriptions of Associations: Intact  Orientation:  Full (Time, Place, and Person)  Thought Content:  Rumination  Suicidal Thoughts:  Yes.  without intent/plan  Homicidal Thoughts:  No  Memory:  Immediate;   Fair Recent;   Fair Remote;   Fair  Judgement:  Impaired  Insight:  Shallow  Psychomotor Activity:  Decreased  Concentration:  Concentration: Poor  Recall:  [employer] of Knowledge:  Fair  Language:  Fair  Akathisia:  No  Handed:  Right  AIMS (if indicated):     Assets:  Desire for Improvement Financial  Resources/Insurance Resilience  ADL's:  Impaired  Cognition:  Impaired,  Mild  Sleep:        Treatment Plan Summary: Continue current medications  Sitter may be used if felt needed by staff and may provide companionship for patient as he has been in the hospital for 79 days  Disposition: No evidence of imminent risk to self or others at present.   Patient does not meet criteria for psychiatric inpatient admission.  [place], FNP 05/02/2019 2:14 PM

## 2019-05-02 NOTE — Progress Notes (Signed)
Physical Therapy Treatment Patient Details Name: Peter Parsons MRN: [id number] DOB: 02-Mar-1960 Today's Date: 05/02/2019    History of Present Illness 59 y.o. male initially presenting to hospital 02/11/19 with Involuntary commitment from group home d/t wandering off into woods multiple times (found near water); pt reporting suicidal thoughts of drowning himself.  Pt tripped on his own feet and hit chin on chair on morning of 2/4 (pt got back up and walked to restroom independently).  Unable to return to group home d/t safety concerns.  Per chart review pt scored 19/30 on [facility] cognitive assessment with psychiatry indicative of moderate cognitive impairment (consistent with pt's diagnosis of dementia).  Pt was in [place] in ED for about 44 days prior to being admitted to hospital 2/13 for generalized weakness, dysphagia, and slurred speech.  MRI negative for acute CVA.  PMH includes DM, htn, schizophrenia.    PT Comments    Patient has been ambulatory in previous sessions, however very resistant throughout this session and continues to tell therapist "you're premature in doing this". Patient required total A to complete bed mobility, non-cooperative with any attempts at transfers. He even became agitated with therapist at attempts for out of bed mobility. Current discharge plan is likely still appropriate given his previous performances with therapy. This date however, no active engagement from patient observed even with attempts at bed exercises.     Follow Up Recommendations  Home health PT;Supervision/Assistance - 24 hour(Unable to provide any further assessment given mentation this date.)     Equipment Recommendations  Rolling walker with 5" wheels;3in1 (PT)    Recommendations for Other Services       Precautions / Restrictions Precautions Precautions: Fall Precaution Comments: Suicide precautions; aspiration precautions; monitor HR Restrictions Weight Bearing Restrictions: No     Mobility  Bed Mobility   Bed Mobility: Supine to Sit;Sit to Supine     Supine to sit: Max assist Sit to supine: Max assist   General bed mobility comments: Patient rigid, non-assistive in transfer. He keeps repeating "this is premature, I failed my test I am not ready for this".  Transfers                    Ambulation/Gait                 Stairs             Wheelchair Mobility    Modified Rankin (Stroke Patients Only)       Balance Overall balance assessment: Needs assistance Sitting-balance support: Feet supported Sitting balance-Leahy Scale: Poor Sitting balance - Comments: L lateral lean.                                    Cognition Arousal/Alertness: Awake/alert;Lethargic Behavior During Therapy: Agitated Overall Cognitive Status: History of cognitive impairments - at baseline                                 General Comments: Patient non-cooperative throughout session. States PT is premature, he is not ready, he didn't pass the test, etc.      Exercises Other Exercises Other Exercises: Assisted RN staff with rolling in bed to change linens after patient voided. Assisted with hygeine/mobility at mod A with verbal instructions provided.    General Comments        Pertinent Vitals/Pain  Pain Assessment: No/denies pain    Home Living                      Prior Function            PT Goals (current goals can now be found in the care plan section) Acute Rehab PT Goals Patient Stated Goal: To get out of the hospital PT Goal Formulation: With patient Time For Goal Achievement: 05/03/19 Potential to Achieve Goals: Fair Progress towards PT goals: Not progressing toward goals - comment(Patient resistant, non-cooperative throughout this session.)    Frequency    Min 2X/week      PT Plan Current plan remains appropriate    Co-evaluation              AM-PAC PT "6 Clicks" Mobility    Outcome Measure  Help needed turning from your back to your side while in a flat bed without using bedrails?: Total Help needed moving from lying on your back to sitting on the side of a flat bed without using bedrails?: Total Help needed moving to and from a bed to a chair (including a wheelchair)?: Total Help needed standing up from a chair using your arms (e.g., wheelchair or bedside chair)?: Total Help needed to walk in hospital room?: Total Help needed climbing 3-5 steps with a railing? : Total 6 Click Score: 6    End of Session   Activity Tolerance: Treatment limited secondary to agitation Patient left: in bed;with nursing/sitter in room Nurse Communication: Mobility status PT Visit Diagnosis: Other abnormalities of gait and mobility (R26.89);Muscle weakness (generalized) (M62.81);History of falling (Z91.81);Difficulty in walking, not elsewhere classified (R26.2)     Time: 1323-1411(Waited for RN staff to assist with mobility for PT safety. Unbilled time included.) PT Time Calculation (min) (ACUTE ONLY): 48 min  Charges:  $Gait Training: 8-22 mins $Therapeutic Activity: 8-22 mins           Royce Macadamia PT, DPT, CSCS             05/02/2019, 2:16 PM

## 2019-05-03 DIAGNOSIS — R410 Disorientation, unspecified: Secondary | ICD-10-CM | POA: Diagnosis not present

## 2019-05-03 DIAGNOSIS — G9341 Metabolic encephalopathy: Secondary | ICD-10-CM | POA: Diagnosis not present

## 2019-05-03 DIAGNOSIS — F200 Paranoid schizophrenia: Secondary | ICD-10-CM | POA: Diagnosis not present

## 2019-05-03 DIAGNOSIS — F0390 Unspecified dementia without behavioral disturbance: Secondary | ICD-10-CM | POA: Diagnosis not present

## 2019-05-03 NOTE — TOC Progression Note (Signed)
Transition of Care [facility]) - Progression Note    Patient Details  Name: Peter Parsons MRN: [id number] Date of Birth: Dec 12, 1960  Transition of Care [facility]) CM/SW Contact  Shelbie Ammons, RN Phone Number: 05/03/2019, 12:07 PM  Clinical Narrative:   RNCM placed call and left HIPAA compliant voicemail with [employer] at [phone].    RNCM placed call and left message with office mate for Renford Dills [phone], she is to call back.  RNCM placed call to [facility] at [phone] and left message with Hassan Rowan who says Erline Levine is the person that needs to be talked to and she will send her a message.  RNCM placed call to [facility] at [phone] and spoke with Rufus she gave fax number of [phone] for information to be sent to. Faxed information requested and including name and contact information for [employer] care coordinator Vanna Scotland.     Expected Discharge Plan: Group Home Barriers to Discharge: SNF Pending bed offer  Expected Discharge Plan and Services Expected Discharge Plan: Group Home   Discharge Planning Services: CM Consult   Living arrangements for the past 2 months: Group Home                 DME Arranged: N/A DME Agency: [employer] Date DME Agency Contacted: 04/12/19 Time DME Agency Contacted: [phone] Representative spoke with at DME Agency: none needed [place] Arranged: NA           Social Determinants of Health ([place]) Interventions    Readmission Risk Interventions Readmission Risk Prevention Plan 04/27/2019 04/20/2019  Transportation Screening Complete Complete  PCP or Specialist Appt within 3-5 Days - Not Complete  Not Complete comments - not ready to [place] or Home Care Consult - Not Complete  HRI or Home Care Consult comments - not ready to[place], looking for a bed for placement  Social Work Consult for [place] Planning/Counseling - [place] - Not Applicable  Medication Review (RN Care  Manager) Referral to Pharmacy Referral to Pharmacy  PCP or Specialist appointment within 3-5 days of discharge Complete -  [place] or [place] Complete -  [place] Not Applicable -

## 2019-05-03 NOTE — TOC Progression Note (Signed)
Transition of Care [facility]) - Progression Note    Patient Details  Name: Keenen Hickling MRN: [id number] Date of Birth: 07/21/1960  Transition [facility] Care [facility]) CM/SW Contact  Shelbie Ammons, RN Phone Number: 05/03/2019, 3:40 PM  Clinical Narrative:  RNCM received return call from [facility] with [facility], they are unable to offer a bed at this time, citing lowered staffing numbers and would not be able to give him the attention he needs.      Expected Discharge Plan: Group Home Barriers to Discharge: SNF Pending bed offer  Expected Discharge Plan and Services Expected Discharge Plan: Group Home   Discharge Planning Services: CM Consult   Living arrangements for the past 2 months: Group Home                 DME Arranged: N/A DME Agency: [employer] Date DME Agency Contacted: 04/12/19 Time DME Agency Contacted: [phone] Representative spoke with at DME Agency: none needed [place] Arranged: NA           Social Determinants of Health ([place]) Interventions    Readmission Risk Interventions Readmission Risk Prevention Plan 04/27/2019 04/20/2019  Transportation Screening Complete Complete  PCP or Specialist Appt within 3-5 Days - Not Complete  Not Complete comments - not ready to [place] or Home Care Consult - Not Complete  HRI or Home Care Consult comments - not ready toDC, looking for a bed for placement  Social Work Consult for [place] Planning/Counseling - [place] - Not Applicable  Medication Review (RN Care Manager) Referral to Pharmacy Referral to Pharmacy  PCP or Specialist appointment within 3-5 days of discharge Complete -  [place] or [place] Complete -  [place] Not Applicable -

## 2019-05-03 NOTE — Plan of Care (Signed)
  Problem: Education: Goal: Knowledge of General Education information will improve Description: Including pain rating scale, medication(s)/side effects and non-pharmacologic comfort measures Outcome: Progressing   Problem: Activity: Goal: Risk for activity intolerance will decrease Outcome: Progressing   Problem: Nutrition: Goal: Adequate nutrition will be maintained Outcome: Progressing   Problem: Pain Managment: Goal: General experience of comfort will improve Outcome: Progressing   Problem: Safety: Goal: Ability to remain free from injury will improve Outcome: Progressing   Problem: Skin Integrity: Goal: Risk for impaired skin integrity will decrease Outcome: Progressing   Problem: Education: Goal: Knowledge of disease or condition will improve Outcome: Progressing Goal: Knowledge of secondary prevention will improve Outcome: Progressing Goal: Knowledge of patient specific risk factors addressed and post discharge goals established will improve Outcome: Progressing   Problem: Nutrition: Goal: Risk of aspiration will decrease Outcome: Progressing   Problem: Intracerebral Hemorrhage Tissue Perfusion: Goal: Complications of Intracerebral Hemorrhage will be minimized Outcome: Progressing   Problem: Ischemic Stroke/TIA Tissue Perfusion: Goal: Complications of ischemic stroke/TIA will be minimized Outcome: Progressing

## 2019-05-03 NOTE — Progress Notes (Signed)
PROGRESS NOTE    Peter Parsons  [id number] DOB: Jun 20, 1960 DOA: 02/11/2019 PCP: System, Provider Not In     Assessment & Plan:   Principal Problem:   Paranoid schizophrenia ([place]) Active Problems:   Altered mental status   Dementia ([place])   Weakness   Acute metabolic encephalopathy   Protein-calorie malnutrition, severe   Pressure injury of skin  Debilitation: PT recs home health w/ 24 hr supervision/assistance  Schizophrenia: w/ frequent suicidal ideation. Continue on paliperidone, klonopin, mirtazapine, risperidone. Does not have decisional capacity, does have a guardian. Psychiatry requested to follow-up  Depression/Bipolar disorder: continue lexapro, trazodone.  Psychiatry was requested to follow-up patient expressed suicidal ideation to the nurse. Psych will see him today  DM2: uncontrolled. continue to hold home dose of metformin  CKDIIIa: GFR is stable. continue to monitor periodically.  We will check labs in the morning tomorrow  Hypokalemia: WNL today. Will continue to monitor   Thrombocytopenia: etiology unclear. Will continue to monitor   Peripheral neuropathy: continue on gabapentin  BPH: continue Flomax  Dyslipidemia: continue statin  HTN: normotensive off all of his antihypertensives possibly secondary to weight loss. Resolved  Dysphagia, dysarthria, left facial droop: CVA ruled out with MRI. Currently still has some dysphagia, pockets foods, but takes in fluids ok. Continue w/ dysphagia diet   Severe Malnutrition: continue on dietary supplements. Continue on mirtazapine    DVT prophylaxis: lovenox  Code Status: full  Family Communication:  Disposition Plan: CM is still working w/ legal guardian to have pt placed in a group home    Consultants:   Psychiatry     Subjective: Remains about the same. flat affect   Objective: Vitals:   05/02/19 1731 05/02/19 2002 05/03/19 0035 05/03/19 0830  BP: 129/87 107/85 126/84 124/78  Pulse:  (!) 101 97 (!) 110 98  Resp: 20 16 16 16   Temp: 97.9 F (36.6 C) 99.4 F (37.4 C) 98.4 F (36.9 C) 98 F (36.7 C)  TempSrc: Oral Oral    SpO2: 98% 96% 97% 98%  Weight:      Height:        Intake/Output Summary (Last 24 hours) at 05/03/2019 1453 Last data filed at 05/03/2019 1431 Gross per 24 hour  Intake 120 ml  Output --  Net 120 ml   Filed Weights   02/11/19 1441 03/28/19 0306 04/27/19 0500  Weight: 77.1 kg 70.8 kg 75.9 kg    Examination:  General exam: Appears calm and comfortable  Respiratory system: Clear to auscultation. No accessory muscle use. No rhonchi Cardiovascular system: S1 & S2 +. No rubs, gallops or clicks.  Gastrointestinal system: Abdomen is nondistended, soft and nontender. normal bowel sounds heard. Central nervous system: awake. Moves all 4 extremities Psychiatry: Judgement and insight appear abnormal. Flat mood and affect    Data Reviewed: I have personally reviewed following labs and imaging studies  CBC: Recent Labs  Lab 04/27/19 0429 04/28/19 0526 05/02/19 0624  WBC 6.2 5.4 8.0  HGB 10.3* 10.2* 10.8*  HCT 30.4* 29.7* 32.2*  MCV 89.7 90.5 91.0  PLT 141* 133* 123456*   Basic Metabolic Panel: Recent Labs  Lab 04/27/19 0429 04/28/19 0526 04/30/19 0348 05/02/19 0624  NA 144 143 142 142  K 3.7 3.2* 3.3* 3.6  CL 101 100 99 99  CO2 32 32 31 33*  GLUCOSE 110* 114* 153* 102*  BUN 42* 45* 44* 40*  CREATININE 1.06 1.09 1.14 1.17  CALCIUM 9.0 9.2 9.0 9.3  MG  --  1.9  --   --  Radiology Studies: No results found.      Scheduled Meds: . atorvastatin  10 mg Oral Daily  . chlorhexidine  15 mL Mouth/Throat BID  . collagenase   Topical Daily  . enoxaparin (LOVENOX) injection  40 mg Subcutaneous Q24H  . escitalopram  10 mg Oral Daily  . feeding supplement (NEPRO CARB STEADY)  237 mL Oral TID BM  . feeding supplement (PRO-STAT SUGAR FREE 64)  30 mL Oral BID WC  . gabapentin  300 mg Oral TID  . mirtazapine  45 mg Oral QHS   . multivitamin with minerals  1 tablet Oral Daily  . risperiDONE  3 mg Oral BID  . senna-docusate  1 tablet Oral Daily  . tamsulosin  0.4 mg Oral Daily  . traZODone  150 mg Oral QHS   Continuous Infusions:   LOS: 35 days    Time spent: 15 mins   Max Sane, MD [facility] Pager [phone]  If 7PM-7AM, please contact night-coverage [email or web address] 05/03/2019, 2:53 PM

## 2019-05-04 DIAGNOSIS — R410 Disorientation, unspecified: Secondary | ICD-10-CM | POA: Diagnosis not present

## 2019-05-04 DIAGNOSIS — F200 Paranoid schizophrenia: Secondary | ICD-10-CM | POA: Diagnosis not present

## 2019-05-04 DIAGNOSIS — G9341 Metabolic encephalopathy: Secondary | ICD-10-CM | POA: Diagnosis not present

## 2019-05-04 DIAGNOSIS — E43 Unspecified severe protein-calorie malnutrition: Secondary | ICD-10-CM | POA: Diagnosis not present

## 2019-05-04 NOTE — TOC Progression Note (Signed)
Transition of Care [facility]) - Progression Note    Patient Details  Name: Peter Parsons MRN: [id number] Date of Birth: [birth year]  Transition of [facility]) CM/SW Contact  Shelbie Ammons, RN Phone Number: 05/04/2019, 1:09 PM  Clinical Narrative:   After unsuccessful phone call attempts with Vanna Scotland sent secure email. She responded and reported that she would look at it by the end of day.     Expected Discharge Plan: Group Home Barriers to Discharge: SNF Pending bed offer  Expected Discharge Plan and Services Expected Discharge Plan: Group Home   Discharge Planning Services: CM Consult   Living arrangements for the past 2 months: Group Home                 DME Arranged: N/A DME Agency: [employer] Date DME Agency Contacted: 04/12/19 Time DME Agency Contacted: [phone] Representative spoke with at DME Agency: none needed [place] Arranged: NA           Social Determinants of Health ([place]) Interventions    Readmission Risk Interventions Readmission Risk Prevention Plan 04/27/2019 04/20/2019  Transportation Screening Complete Complete  PCP or Specialist Appt within 3-5 Days - Not Complete  Not Complete comments - not ready to [place] or Home Care Consult - Not Complete  HRI or Home Care Consult comments - not ready toDC, looking for a bed for placement  Social Work Consult for [place] Planning/Counseling - [place] - Not Applicable  Medication Review (RN Care Manager) Referral to Pharmacy Referral to Pharmacy  PCP or Specialist appointment within 3-5 days of discharge Complete -  [place] or [place] Complete -  [place] Not Applicable -

## 2019-05-04 NOTE — Progress Notes (Signed)
Nutrition Follow-up  DOCUMENTATION CODES:   Severe malnutrition in context of social or environmental circumstances  INTERVENTION:  Continue Nepro Shake po TID, each supplement provides 425 kcal and 19 grams protein.  Will discontinue Pro-Stat as PO intake continues to improve.  Continue daily MVI.  NUTRITION DIAGNOSIS:   Severe Malnutrition related to social / environmental circumstances(inadequate oral intake) as evidenced by percent weight loss, moderate fat depletion, moderate muscle depletion, severe muscle depletion.  Ongoing - addressing with ONS.  GOAL:   Patient will meet greater than or equal to 90% of their needs  Met with oral nutrition supplements.  MONITOR:   PO intake, Supplement acceptance, Labs, Weight trends, I & O's  REASON FOR ASSESSMENT:   Malnutrition Screening Tool    ASSESSMENT:   59 year old male with PMHx of schizophrenia, HTN, DM who has been in [place] for 44 days and is now admitted with generalized weakness, dysphagia, slurred speech, left facial droop to rule out CVA.  Met with patient at bedside. He was working on eating his lunch. He is slower to respond today but does report his appetite continues to improve. Overall his intake is 75-100% now. He reports he continues to drink Nepro and Pro-Stat. In the past 24 hours patient has met >100% calorie and protein needs. He is not meeting his calorie/protein needs from meals alone (approximately 1200 kcal and 60 grams of protein from meals). Will continue Nepro supplements but patient no longer requires Pro-Stat.  Medications reviewed and include: gabapentin, Remeron 45 mg QHS, MVI daily, senna-docusate 1 tablet daily, Flomax.  Labs reviewed: CO2 33, BUN 40.  Weight trend: 74 kg on 3/23; +3.2 kg from 2/14  Diet Order:   Diet Order            DIET - DYS 1 Room service appropriate? Yes with Assist; Fluid consistency: Thin  Diet effective now             EDUCATION NEEDS:   No education  needs have been identified at this time  Skin:  Skin Assessment: Skin Integrity Issues:(unstageable to coccyx; DTIs to bilateral heels and right ankle)  Last BM:  05/04/2019 small type 1  Height:   Ht Readings from Last 1 Encounters:  03/28/19 5' 9"  (1.753 m)   Weight:   Wt Readings from Last 1 Encounters:  05/04/19 74 kg   Ideal Body Weight:  72.7 kg  BMI:  Body mass index is 24.09 kg/m.  Estimated Nutritional Needs:   Kcal:  2000-2200  Protein:  100-110 grams  Fluid:  >/= 2 L/day  Jacklynn Barnacle, MS, RD, LDN Pager number available on Amion

## 2019-05-04 NOTE — TOC Progression Note (Signed)
Transition of Care [facility]) - Progression Note    Patient Details  Name: Peter Parsons MRN: [id number] Date of Birth: 1960/08/25  Transition [facility]) CM/SW Contact  Shelbie Ammons, RN Phone Number: 05/04/2019, 10:06 AM  Clinical Narrative:   Received return call from [place] at [facility] group homes. She reports that they are able to offer patient a bed and she has left a message for Vanna Scotland stating the same.  RNCM placed call to Vanna Scotland and left VM at 10am regarding need for return call to group home as well as this CM.     Expected Discharge Plan: Group Home Barriers to Discharge: SNF Pending bed offer  Expected Discharge Plan and Services Expected Discharge Plan: Group Home   Discharge Planning Services: CM Consult   Living arrangements for the past 2 months: Group Home                 DME Arranged: N/A DME Agency: [employer] Date DME Agency Contacted: 04/12/19 Time DME Agency Contacted: [phone] Representative spoke with at DME Agency: none needed [place] Arranged: NA           Social Determinants of Health ([place]) Interventions    Readmission Risk Interventions Readmission Risk Prevention Plan 04/27/2019 04/20/2019  Transportation Screening Complete Complete  PCP or Specialist Appt within 3-5 Days - Not Complete  Not Complete comments - not ready to [place] or Home Care Consult - Not Complete  HRI or Home Care Consult comments - not ready toDC, looking for a bed for placement  Social Work Consult for [place] Planning/Counseling - [place] - Not Applicable  Medication Review (RN Care Manager) Referral to Pharmacy Referral to Pharmacy  PCP or Specialist appointment within 3-5 days of discharge Complete -  [place] or [place] Complete -  [place] Not Applicable -

## 2019-05-04 NOTE — Progress Notes (Signed)
PROGRESS NOTE    Peter Parsons  [id number] DOB: Mar 21, 1960 DOA: 02/11/2019 PCP: System, Provider Not In     Assessment & Plan:   Principal Problem:   Paranoid schizophrenia ([place]) Active Problems:   Altered mental status   Dementia ([place])   Weakness   Acute metabolic encephalopathy   Protein-calorie malnutrition, severe   Pressure injury of skin  Debilitation: PT recs home health w/ 24 hr supervision/assistance  Schizophrenia: w/ frequent suicidal ideation. Continue on paliperidone, klonopin, mirtazapine, risperidone. Does not have decisional capacity, does have a guardian. Psychiatry requested to follow-up  Depression/Bipolar disorder: continue lexapro, trazodone.  Psychiatry was requested to follow-up patient expressed suicidal ideation to the nurse. Psych will see him today  DM2: uncontrolled. continue to hold home dose of metformin  CKDIIIa: GFR is stable. continue to monitor periodically.  We will check labs in the morning tomorrow  Hypokalemia: WNL today. Will continue to monitor   Thrombocytopenia: etiology unclear. Will continue to monitor   Peripheral neuropathy: continue on gabapentin  BPH: continue Flomax  Dyslipidemia: continue statin  HTN: normotensive off all of his antihypertensives possibly secondary to weight loss. Resolved  Dysphagia, dysarthria, left facial droop: CVA ruled out with MRI. Currently still has some dysphagia, pockets foods, but takes in fluids ok. Continue w/ dysphagia diet   Severe Malnutrition: continue on dietary supplements. Continue on mirtazapine    DVT prophylaxis: lovenox  Code Status: full  Family Communication:  Disposition Plan: CM is still working w/ legal guardian to have pt placed in a group home  [facility] group home seem to be interested in offering him bed.  Evaluation/assessment in progress per [facility] team   Consultants:   Psychiatry     Subjective: Remains about the same. flat affect.  Waiting for  placement   Objective: Vitals:   05/04/19 0300 05/04/19 0500 05/04/19 0749 05/04/19 1213  BP: 115/82  111/63 102/62  Pulse: 90  66 67  Resp: 16     Temp: 98.1 F (36.7 C)  99.5 F (37.5 C) 99.7 F (37.6 C)  TempSrc: Oral  Oral Oral  SpO2: 97%  95% 97%  Weight:  74 kg    Height:        Intake/Output Summary (Last 24 hours) at 05/04/2019 1555 Last data filed at 05/04/2019 1048 Gross per 24 hour  Intake 731 ml  Output --  Net 731 ml   Filed Weights   03/28/19 0306 04/27/19 0500 05/04/19 0500  Weight: 70.8 kg 75.9 kg 74 kg    Examination:  General exam: Appears calm and comfortable  Respiratory system: Clear to auscultation. No accessory muscle use. No rhonchi Cardiovascular system: S1 & S2 +. No rubs, gallops or clicks.  Gastrointestinal system: Abdomen is nondistended, soft and nontender. normal bowel sounds heard. Central nervous system: awake. Moves all 4 extremities Psychiatry: Judgement and insight appear abnormal. Flat mood and affect    Data Reviewed: I have personally reviewed following labs and imaging studies  CBC: Recent Labs  Lab 04/28/19 0526 05/02/19 0624  WBC 5.4 8.0  HGB 10.2* 10.8*  HCT 29.7* 32.2*  MCV 90.5 91.0  PLT 133* 123456*   Basic Metabolic Panel: Recent Labs  Lab 04/28/19 0526 04/30/19 0348 05/02/19 0624  NA 143 142 142  K 3.2* 3.3* 3.6  CL 100 99 99  CO2 32 31 33*  GLUCOSE 114* 153* 102*  BUN 45* 44* 40*  CREATININE 1.09 1.14 1.17  CALCIUM 9.2 9.0 9.3  MG 1.9  --   --  Radiology Studies: No results found.      Scheduled Meds: . atorvastatin  10 mg Oral Daily  . chlorhexidine  15 mL Mouth/Throat BID  . collagenase   Topical Daily  . enoxaparin (LOVENOX) injection  40 mg Subcutaneous Q24H  . escitalopram  10 mg Oral Daily  . feeding supplement (NEPRO CARB STEADY)  237 mL Oral TID BM  . gabapentin  300 mg Oral TID  . mirtazapine  45 mg Oral QHS  . multivitamin with minerals  1 tablet Oral Daily  .  risperiDONE  3 mg Oral BID  . senna-docusate  1 tablet Oral Daily  . tamsulosin  0.4 mg Oral Daily  . traZODone  150 mg Oral QHS   Continuous Infusions:   LOS: 36 days    Time spent: 15 mins   Max Sane, MD [facility] Pager [phone]  If 7PM-7AM, please contact night-coverage [email or web address] 05/04/2019, 3:55 PM

## 2019-05-04 NOTE — Progress Notes (Signed)
Physical Therapy Treatment Patient Details Name: Peter Parsons MRN: [id number] DOB: 07-18-1960 Today's Date: 05/04/2019    History of Present Illness 59 y.o. male initially presenting to hospital 02/11/19 with Involuntary commitment from group home d/t wandering off into woods multiple times (found near water); pt reporting suicidal thoughts of drowning himself.  Pt tripped on his own feet and hit chin on chair on morning of 2/4 (pt got back up and walked to restroom independently).  Unable to return to group home d/t safety concerns.  Per chart review pt scored 19/30 on [facility] cognitive assessment with psychiatry indicative of moderate cognitive impairment (consistent with pt's diagnosis of dementia).  Pt was in [place] in ED for about 44 days prior to being admitted to hospital 2/13 for generalized weakness, dysphagia, and slurred speech.  MRI negative for acute CVA.  PMH includes DM, htn, schizophrenia.    PT Comments    Pt was long sitting in bed upon arriving. He continues to present with flat affect but was cooperative. Mostly non verbal this date. Bed soaked from incontinence episode. RN/Tech arrived to assist with linen change while pt was OOB. Pt required min assist to initiate movements but once he started to perform desired task, was able to perform mostly with CGA + gait belt. He was able to ambulate to doorway of room using RW prior to HR elevation to 148 bpm.Resting HR in bed prior to OOB activity was 98 bpm. Discontinued ambulation and pt return to recliner in room. Therapist did not feel comfortable progressing ambulation with pt that tachycardic. Therapist discussed with RN staff the importance of pt getting up more frequently. Pt was seated in recliner with BLEs elevated, chair alarm placed, and RN aware of pt's abilities. Acute PT will continue to follow per POC progressing as able.     Follow Up Recommendations  Home health PT;Supervision/Assistance - 24 hour     Equipment  Recommendations  Rolling walker with 5" wheels;3in1 (PT)    Recommendations for Other Services       Precautions / Restrictions Precautions Precautions: Fall Precaution Comments: Suicide precautions; aspiration precautions; monitor HR Restrictions Weight Bearing Restrictions: No    Mobility  Bed Mobility Overal bed mobility: Needs Assistance Bed Mobility: Supine to Sit;Sit to Supine     Supine to sit: Min assist     General bed mobility comments: Pt required min assist to initiate movements towards EOB. Once movements started, pt required supervision + increased time.  Transfers Overall transfer level: Needs assistance Equipment used: Rolling walker (2 wheeled) Transfers: Sit to/from Stand Sit to Stand: Min assist;Supervision         General transfer comment: Pt also required min assist to initiate standing to RW but once movements initiated required supervision.  Ambulation/Gait Ambulation/Gait assistance: Min guard Gait Distance (Feet): 25 Feet Assistive device: Rolling walker (2 wheeled) Gait Pattern/deviations: Narrow base of support;Scissoring Gait velocity: decreased   General Gait Details: Pt ambulated to doorway of room on rm air but HR elevated to 148bpm and discontinued 2/2 to cardiac concerns.   Stairs             Wheelchair Mobility    Modified Rankin (Stroke Patients Only)       Balance                                            Cognition  Arousal/Alertness: Awake/alert;Lethargic Behavior During Therapy: Flat affect Overall Cognitive Status: History of cognitive impairments - at baseline                                 General Comments: Pt was very flat this date and minimally talked even with max encouragement to do so.      Exercises      General Comments        Pertinent Vitals/Pain Pain Assessment: No/denies pain Faces Pain Scale: No hurt Pain Intervention(s): Limited activity within  patient's tolerance;Monitored during session    Home Living                      Prior Function            PT Goals (current goals can now be found in the care plan section) Acute Rehab PT Goals Patient Stated Goal: none stated Progress towards PT goals: Progressing toward goals    Frequency    Min 2X/week      PT Plan Current plan remains appropriate    Co-evaluation              AM-PAC PT "6 Clicks" Mobility   Outcome Measure  Help needed turning from your back to your side while in a flat bed without using bedrails?: A Lot Help needed moving from lying on your back to sitting on the side of a flat bed without using bedrails?: A Lot Help needed moving to and from a bed to a chair (including a wheelchair)?: A Lot Help needed standing up from a chair using your arms (e.g., wheelchair or bedside chair)?: A Lot Help needed to walk in hospital room?: A Lot Help needed climbing 3-5 steps with a railing? : A Lot 6 Click Score: 12    End of Session Equipment Utilized During Treatment: Gait belt Activity Tolerance: Patient tolerated treatment well;Treatment limited secondary to medical complications (Comment)(tachycardia) Patient left: in chair;with call bell/phone within reach;with chair alarm set Nurse Communication: Mobility status PT Visit Diagnosis: Other abnormalities of gait and mobility (R26.89);Muscle weakness (generalized) (M62.81);History of falling (Z91.81);Difficulty in walking, not elsewhere classified (R26.2)     Time: [id number] PT Time Calculation (min) (ACUTE ONLY): 16 min  Charges:  $Therapeutic Activity: 8-22 mins                     Julaine Fusi PTA 05/04/19, 3:53 PM

## 2019-05-04 NOTE — Progress Notes (Signed)
Occupational Therapy Treatment Patient Details Name: Peter Parsons MRN: [id number] DOB: 1960/12/25 Today's Date: 05/04/2019    History of present illness 59 y.o. male initially presenting to hospital 02/11/19 with Involuntary commitment from group home d/t wandering off into woods multiple times (found near water); pt reporting suicidal thoughts of drowning himself.  Pt tripped on his own feet and hit chin on chair on morning of 2/4 (pt got back up and walked to restroom independently).  Unable to return to group home d/t safety concerns.  Per chart review pt scored 19/30 on [facility] cognitive assessment with psychiatry indicative of moderate cognitive impairment (consistent with pt's diagnosis of dementia).  Pt was in [place] in ED for about 44 days prior to being admitted to hospital 2/13 for generalized weakness, dysphagia, and slurred speech.  MRI negative for acute CVA.  PMH includes DM, htn, schizophrenia.   OT comments  Mr Aldous was seen for OT treatment on this date. Pt was reclined in chair having finished PT session. He presents with flat affect and minimally engaged in conversation but perseverates for ~2 mins on how "premature it was to expect me to walk. I'm not ready for that." Pt instructed in importance of supervision for OOB mobility, staying OOB for improved functional endurance, and falls prevention. Requiring SETUP + MAX cues for initiation and thoroughness seated ADL tasks. Pt verbalized understanding of instruction provided. Pt making good progress toward goals. Pt continues to benefit from skilled OT services to maximize return to PLOF and minimize risk of future falls, injury, caregiver burden, and readmission. Will continue to follow POC. Discharge recommendation remains appropriate.    Follow Up Recommendations  Home health OT;Supervision/Assistance - 24 hour    Equipment Recommendations  3 in 1 bedside commode    Recommendations for Other Services      Precautions /  Restrictions Precautions Precautions: Fall Precaution Comments: Suicide precautions; aspiration precautions; monitor HR Restrictions Weight Bearing Restrictions: No       Mobility Bed Mobility Overal bed mobility: Needs Assistance Bed Mobility: Supine to Sit;Sit to Supine     Supine to sit: Min assist     General bed mobility comments: Pt received in recliner and became perseverative with mention of OOB activity. Increased time to adjust position in recliner.  Transfers Overall transfer level: Needs assistance Equipment used: Rolling walker (2 wheeled) Transfers: Sit to/from Stand Sit to Stand: Min assist;Supervision         General transfer comment: Pt also required min assist to initiate standing to RW but once movements initiated required supervision.    Balance                                           ADL either performed or assessed with clinical judgement   ADL Overall ADL's : Needs assistance/impaired                                       General ADL Comments: Pt demonstrated poor initiation of ADLs, responding better to object prompt than verbal prompts. SETUP for self-feeding and face washing reclined in chair - MIN A for thoroughness (even with traytop mirror provided pt terminated face washing early. Pt required MAX cues to engage in self-feeding).      Vision  Perception     Praxis      Cognition Arousal/Alertness: Awake/alert;Lethargic Behavior During Therapy: Flat affect Overall Cognitive Status: History of cognitive impairments - at baseline                                 General Comments: Pt engaged in mininmal conversation except to perseverate on how "he is too premature asking me to walk out there"        Exercises Other Exercises Other Exercises: Pt educated re: importance of supervision for OOB mobility, falls prevention Other Exercises: Self-feeding/drinking, face washing    Shoulder Instructions       General Comments      Pertinent Vitals/ Pain       Pain Assessment: No/denies pain Faces Pain Scale: No hurt Pain Intervention(s): Limited activity within patient's tolerance;Monitored during session  Home Living                                          Prior Functioning/Environment              Frequency  Min 2X/week        Progress Toward Goals  OT Goals(current goals can now be found in the care plan section)  Progress towards OT goals: Progressing toward goals  Acute Rehab OT Goals Patient Stated Goal: none stated OT Goal Formulation: With patient Time For Goal Achievement: 05/13/19 Potential to Achieve Goals: Good ADL Goals Pt Will Perform Grooming: standing;with supervision(with RW) Pt Will Perform Lower Body Dressing: with supervision;sit to/from stand Pt Will Transfer to Toilet: with supervision;ambulating;regular height toilet;grab bars(with RW) Additional ADL Goal #1: In two weeks, pt will independently return verbalize/demonstrate understanding of at least 3 falls prevention strategies for home and hospital for improved safety and functional independence upon hospital DC.  Plan Discharge plan remains appropriate;Frequency remains appropriate    Co-evaluation                 AM-PAC OT "6 Clicks" Daily Activity     Outcome Measure   Help from another person eating meals?: None Help from another person taking care of personal grooming?: A Little Help from another person toileting, which includes using toliet, bedpan, or urinal?: A Little Help from another person bathing (including washing, rinsing, drying)?: A Lot Help from another person to put on and taking off regular upper body clothing?: A Little Help from another person to put on and taking off regular lower body clothing?: A Lot 6 Click Score: 17    End of Session    OT Visit Diagnosis: Unsteadiness on feet (R26.81);Muscle weakness  (generalized) (M62.81)   Activity Tolerance Treatment limited secondary to agitation   Patient Left in chair;with call bell/phone within reach;with chair alarm set   Nurse Communication          Time: [id number] OT Time Calculation (min): 13 min  Charges: OT General Charges $OT Visit: 1 Visit OT Treatments $Self Care/Home Management : 8-22 mins  Dessie Coma, M.S. OTR/L  05/04/19, 4:11 PM

## 2019-05-04 NOTE — TOC Progression Note (Signed)
Transition of Care [facility]) - Progression Note    Patient Details  Name: Peter Parsons MRN: [id number] Date of Birth: 1960/06/18  Transition [facility] CM/SW Contact  Shelbie Ammons, RN Phone Number: 05/04/2019, 2:48 PM  Clinical Narrative:   Received return call from [place] from [facility], she reports that she has spoken with [name] at group home and they are interested in patient. Adia reports that they are going to pursue patient going to this facility however several things have to happen and she will get the documentation started. She further reports that [name] from the group home will likely want to do an in person visit and will be contacting this CM. Provided update to Adia on patient status and sent his current MAR. RNCM questioned as to when an update might be avialable, Adia reported that she might be able to give an update next week.  RNCM updated both Terri Quarry manager and [employer].     Expected Discharge Plan: Group Home Barriers to Discharge: SNF Pending bed offer  Expected Discharge Plan and Services Expected Discharge Plan: Group Home   Discharge Planning Services: CM Consult   Living arrangements for the past 2 months: Group Home                 DME Arranged: N/A DME Agency: [employer] Date DME Agency Contacted: 04/12/19 Time DME Agency Contacted: [phone] Representative spoke with at DME Agency: none needed [place] Arranged: NA           Social Determinants of Health ([place]) Interventions    Readmission Risk Interventions Readmission Risk Prevention Plan 04/27/2019 04/20/2019  Transportation Screening Complete Complete  PCP or Specialist Appt within 3-5 Days - Not Complete  Not Complete comments - not ready to [place] or Home Care Consult - Not Complete  HRI or Home Care Consult comments - not ready toDC, looking for a bed for placement  Social Work Consult for [place] Planning/Counseling - [place] - Not Applicable  Medication  Review (RN Care Manager) Referral to Pharmacy Referral to Pharmacy  PCP or Specialist appointment within 3-5 days of discharge Complete -  [place] or [place] Complete -  [place] Not Applicable -

## 2019-05-05 DIAGNOSIS — F200 Paranoid schizophrenia: Secondary | ICD-10-CM | POA: Diagnosis not present

## 2019-05-05 NOTE — Progress Notes (Signed)
Peter Parsons at [place] NAME: Peter Parsons    MR#:  [id number]  DATE OF BIRTH:  [birth year]  SUBJECTIVE:   Patient remains with same flat affect. No new issues per RN. Walked with walker around the nurses station with RN. REVIEW OF SYSTEMS:   Review of Systems  Unable to perform ROS: Psychiatric disorder   Tolerating Diet:yes Tolerating PT: HHPT  DRUG ALLERGIES:   Allergies  Allergen Reactions  . Acetaminophen Other (See Comments)    Unknown Unable to breathe Chest  tightness/SOB     VITALS:  Blood pressure 121/79, pulse (!) 101, temperature 97.9 F (36.6 C), temperature source Oral, resp. rate 18, height 5\' 9"  (1.753 m), weight 74 kg, SpO2 95 %.  PHYSICAL EXAMINATION:   Physical Exam limited exam  GENERAL:  59 y.o.-year-old patient lying in the bed with no acute distress.   LUNGS: Normal breath sounds bilaterally, no wheezing, rales, rhonchi. No use of accessory muscles of respiration.  CARDIOVASCULAR: S1, S2 normal. No murmurs, rubs, or gallops.  ABDOMEN: Soft, nontender, nondistended. Bowel sounds present. No organomegaly or mass.  EXTREMITIES: No cyanosis, clubbing or edema b/l.    NEUROLOGIC: grossly nonfocal. Ambulates with walker  PSYCHIATRIC:  patient is alert with flat affect SKIN: No obvious rash, lesion, or ulcer.   LABORATORY PANEL:  CBC Recent Labs  Lab 05/02/19 0624  WBC 8.0  HGB 10.8*  HCT 32.2*  PLT 146*    Chemistries  Recent Labs  Lab 05/02/19 0624  NA 142  K 3.6  CL 99  CO2 33*  GLUCOSE 102*  BUN 40*  CREATININE 1.17  CALCIUM 9.3   Cardiac Enzymes No results for input(s): TROPONINI in the last 168 hours. RADIOLOGY:  No results found. ASSESSMENT AND PLAN:    Chronic Schizophrenia:w/ frequent suicidal ideation. Continue on paliperidone, klonopin, mirtazapine, risperidone. Does not have decisional capacity, does have a guardian. Psychiatry requested to follow-up  Depression/Bipolar  disorder: continue lexapro, trazodone.  Psychiatry was requested to follow-up patient expressed suicidal ideation to the nurse. Psych will see him today  DM2: controlled. 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   Hypokalemia: WNL today. Will continue to monitor   Thrombocytopenia: etiology unclear. Will continue to monitor  plt 146K  Peripheral neuropathy: continue on gabapentin  BPH: continue Flomax  Dyslipidemia: continue statin  HTN: normotensive off all of his antihypertensives possibly secondary to weight loss. Resolved  Dysphagia, dysarthria, left facial droop: CVA ruled out with MRI. Currently still has some dysphagia, pockets foods, but takes in fluids ok. Continue w/ dysphagia diet   Severe Malnutrition: continue on dietary supplements. Continue on mirtazapine    Debilitation: PT recs home health w/ 24 hr supervision/assistance   DVT prophylaxis: lovenox  Code Status: full  Family Communication: none Disposition Plan: CM is still working w/ legal guardian to have pt placed in a group home  [facility] group home seem to be interested in offering him bed.  Evaluation/assessment in progress per TOC team    TOTAL TIME TAKING CARE OF THIS PATIENT: *25* minutes.  >50% time spent on counselling and coordination of care  Note: This dictation was prepared with Dragon dictation along with smaller phrase technology. Any transcriptional errors that result from this process are unintentional.  Peter Parsons M.D    [facility]   CC: Primary care physician; Peter Parsons:  Peter Parsons, male   DOB: [birth year], 59 y.o.   MRN: [id number]

## 2019-05-06 DIAGNOSIS — F200 Paranoid schizophrenia: Secondary | ICD-10-CM | POA: Diagnosis not present

## 2019-05-06 NOTE — Progress Notes (Signed)
[place] at [place] NAME: Peter Parsons    MR#:  [id number]  DATE OF BIRTH:  [birth year]  SUBJECTIVE:   Patient remains with same flat affect. No new issues per RN. Walked with walker around the nurses station with RN. REVIEW OF SYSTEMS:   Review of Systems  Unable to perform ROS: Psychiatric disorder   Tolerating Diet:yes Tolerating PT: HHPT patient ambulates using a walker around the nurses station DRUG ALLERGIES:   Allergies  Allergen Reactions  . Acetaminophen Other (See Comments)    Unknown Unable to breathe Chest  tightness/SOB     VITALS:  Blood pressure 106/82, pulse 77, temperature 98.8 F (37.1 C), temperature source Oral, resp. rate 17, height 5\' 9"  (1.753 m), weight 74 kg, SpO2 96 %.  PHYSICAL EXAMINATION:   Physical Exam limited exam  GENERAL:  59 y.o.-year-old patient lying in the bed with no acute distress.   LUNGS: Normal breath sounds bilaterally, no wheezing, rales, rhonchi. No use of accessory muscles of respiration.  CARDIOVASCULAR: S1, S2 normal. No murmurs, rubs, or gallops.  ABDOMEN: Soft, nontender, nondistended. Bowel sounds present. No organomegaly or mass.  EXTREMITIES: No cyanosis, clubbing or edema b/l.    NEUROLOGIC: grossly nonfocal. Ambulates with walker  PSYCHIATRIC:  patient is alert with flat affect SKIN: No obvious rash, lesion, or ulcer.   LABORATORY PANEL:  CBC Recent Labs  Lab 05/02/19 0624  WBC 8.0  HGB 10.8*  HCT 32.2*  PLT 146*    Chemistries  Recent Labs  Lab 05/02/19 0624  NA 142  K 3.6  CL 99  CO2 33*  GLUCOSE 102*  BUN 40*  CREATININE 1.17  CALCIUM 9.3   Cardiac Enzymes No results for input(s): TROPONINI in the last 168 hours. RADIOLOGY:  No results found. ASSESSMENT AND PLAN:    Chronic Schizophrenia:w/ frequent suicidal ideation. Continue on paliperidone, klonopin, mirtazapine, risperidone. Does not have decisional capacity, does have a guardian.  Psychiatry requested to follow-up  Depression/Bipolar disorder: continue lexapro, trazodone.  Psychiatry was requested to follow-up patient expressed suicidal ideation to the nurse. Psych will see him today  DM2: controlled. 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   Hypokalemia: WNL today. Will continue to monitor   Thrombocytopenia: etiology unclear. Will continue to monitor  plt 146K  Peripheral neuropathy: continue on gabapentin  BPH: continue Flomax  Dyslipidemia: continue statin  HTN: normotensive off all of his antihypertensives possibly secondary to weight loss. Resolved  Dysphagia, dysarthria, left facial droop: CVA ruled out with MRI. Currently still has some dysphagia, pockets foods, but takes in fluids ok. Continue w/ dysphagia diet   Severe Malnutrition: continue on dietary supplements. Continue on mirtazapine    Debilitation: PT recs home health w/ 24 hr supervision/assistance   DVT prophylaxis: lovenox  Code Status: full  Family Communication: none Disposition Plan: CM is still working w/ legal guardian to have pt placed in a group home  Per TOC today: RNCM spoke with [name] with [facility] group [facility], she would like to come assess patient at bedside and reports she may be able to do this on 3/30 but she will let this CM know ahead of time. Adia with [facility] has initiated process for working to get him into group home.    TOTAL TIME TAKING CARE OF THIS PATIENT: *20* minutes.  >50% time spent on counselling and coordination of  care  Note: This dictation was prepared with Dragon dictation along with smaller phrase technology. Any transcriptional errors that result from this process are unintentional.  Fritzi Mandes M.D    [facility]   CC: Primary care physician; System, Provider Not InPatient ID: Peter Parsons, male   DOB: 12-22-1960, 59 y.o.   MRN:  [id number]

## 2019-05-06 NOTE — Progress Notes (Signed)
PT Discharge Note  Patient Details Name: Peter Parsons MRN: [id number] DOB: [birth year]   This PT touched base with MD and multiple members of the rehab team that have seen pt during this prolonged inpatient stay.  By all accounts pt is physically able to ambulate prolonged distances requiring only directional and safety guidance and no direct assist; we have likely exhausted our ability to help him in a skilled way as his "baseline status" seems unchanged in recent weeks.  Pt's mental status is his biggest limiter (both for general participation as well as ability to follow through on cuing/traning), but when he is willing to walk he consistent can ambulate 150 to 400 ft w/o LOBs and often w/o AD.  MD reiterates that she will continue to encourage regular walking by nursing.  Should his functional status meaningfully change PT can be reconsulted, but at this time he does not require further skilled intervention - though consistent walking/activity remains important for him while in-house.  Kreg Shropshire, DPT 05/06/2019, 9:21 AM

## 2019-05-06 NOTE — Progress Notes (Signed)
Patient still is not voiding. Bladder scan done at 0245 and was 678. At [place] patient had an in and out cath. 600 cc was collected.

## 2019-05-06 NOTE — TOC Progression Note (Signed)
Transition of Care [facility]) - Progression Note    Patient Details  Name: Peter Parsons MRN: [id number] Date of Birth: [birth year]  Transition of Care [facility]) CM/SW Contact  Shelbie Ammons, RN Phone Number: 05/06/2019, 9:23 AM  Clinical Narrative:   RNCM spoke with [name] with [facility] group home, she would like to come assess patient at bedside and reports she may be able to do this on 3/30 but she will let this CM know ahead of time. Adia with [place] has initiated process for working to get him into group home.     Expected Discharge Plan: Group Home Barriers to Discharge: SNF Pending bed offer  Expected Discharge Plan and Services Expected Discharge Plan: Group Home   Discharge Planning Services: CM Consult   Living arrangements for the past 2 months: Group Home                 DME Arranged: N/A DME Agency: [employer] Date DME Agency Contacted: 04/12/19 Time DME Agency Contacted: [phone] Representative spoke with at DME Agency: none needed [place] Arranged: NA           Social Determinants of Health ([place]) Interventions    Readmission Risk Interventions Readmission Risk Prevention Plan 04/27/2019 04/20/2019  Transportation Screening Complete Complete  PCP or Specialist Appt within 3-5 Days - Not Complete  Not Complete comments - not ready to [place] or Home Care Consult - Not Complete  HRI or Home Care Consult comments - not ready [place], looking for a bed for placement  Social Work Consult for [place] Planning/Counseling - [place] - Not Applicable  Medication Review (RN Care Manager) Referral to Pharmacy Referral to Pharmacy  PCP or Specialist appointment within 3-5 days of discharge Complete -  [place] or [place] Complete -  [place] Not Applicable -

## 2019-05-07 DIAGNOSIS — F200 Paranoid schizophrenia: Secondary | ICD-10-CM | POA: Diagnosis not present

## 2019-05-07 NOTE — Progress Notes (Signed)
[place] at [place] NAME: Peter Parsons    MR#:  [id number]  DATE OF BIRTH:  1960/12/13  SUBJECTIVE:   Patient remains with same flat affect. Patient awake at present however earlier was not awake enough to take his meds and breakfast  REVIEW OF SYSTEMS:   Review of Systems  Unable to perform ROS: Psychiatric disorder   Tolerating Diet:yes Tolerating PT: HHPT patient ambulates using a walker around the nurses station when his mood is stable DRUG ALLERGIES:   Allergies  Allergen Reactions  . Acetaminophen Other (See Comments)    Unknown Unable to breathe Chest  tightness/SOB     VITALS:  Blood pressure 119/84, pulse 88, temperature 98.4 F (36.9 C), temperature source Oral, resp. rate 15, height 5\' 9"  (1.753 m), weight 74 kg, SpO2 95 %.  PHYSICAL EXAMINATION:   Physical Exam limited exam  GENERAL:  59 y.o.-year-old patient lying in the bed with no acute distress.   LUNGS: Normal breath sounds bilaterally, no wheezing, rales, rhonchi. No use of accessory muscles of respiration.  CARDIOVASCULAR: S1, S2 normal. No murmurs, rubs, or gallops.  ABDOMEN: Soft, nontender, nondistended. Bowel sounds present. No organomegaly or mass.  EXTREMITIES: No cyanosis, clubbing or edema b/l.    NEUROLOGIC: grossly nonfocal. Ambulates with walker  PSYCHIATRIC:  patient is alert with flat affect SKIN: No obvious rash, lesion, or ulcer.   LABORATORY PANEL:  CBC Recent Labs  Lab 05/02/19 0624  WBC 8.0  HGB 10.8*  HCT 32.2*  PLT 146*    Chemistries  Recent Labs  Lab 05/02/19 0624  NA 142  K 3.6  CL 99  CO2 33*  GLUCOSE 102*  BUN 40*  CREATININE 1.17  CALCIUM 9.3   Cardiac Enzymes No results for input(s): TROPONINI in the last 168 hours. RADIOLOGY:  No results found. ASSESSMENT AND PLAN:    Chronic Schizophrenia:w/ frequent suicidal ideation. Continue on paliperidone, klonopin, mirtazapine, risperidone. Does not have  decisional capacity, does have a guardian. Psychiatry requested to follow-up  Depression/Bipolar disorder: continue lexapro, trazodone.  Psychiatry was requested to follow-up patient expressed suicidal ideation to the nurse. Psych will see him today  DM2: controlled. 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   Hypokalemia: WNL today. Will continue to monitor   Thrombocytopenia: etiology unclear. Will continue to monitor  plt 146K  Peripheral neuropathy: continue on gabapentin  BPH: continue Flomax  Dyslipidemia: continue statin  HTN: normotensive off all of his antihypertensives possibly secondary to weight loss. Resolved  Dysphagia, dysarthria, left facial droop: CVA ruled out with MRI. Currently still has some dysphagia, pockets foods, but takes in fluids ok. Continue w/ dysphagia diet   Severe Malnutrition: continue on dietary supplements. Continue on mirtazapine    Debilitation: PT recs home health w/ 24 hr supervision/assistance   DVT prophylaxis: lovenox  Code Status: full  Family Communication: none Disposition Plan: CM is still working w/ legal guardian to have pt placed in a group home  Per TOC today: RNCM spoke with [name] with [facility], she would like to come assess patient at bedside and reports she may be able to do this on 3/30 but she will let this CM know ahead of time. Adia with [place] [facility] [place] has initiated process for working to get him into group home.    TOTAL TIME TAKING CARE OF THIS PATIENT: *20* minutes.  >  50% time spent on counselling and coordination of care  Note: This dictation was prepared with Dragon dictation along with smaller phrase technology. Any transcriptional errors that result from this process are unintentional.  Fritzi Mandes M.D    [facility]   CC: Primary care physician; System, Provider Not InPatient ID: Peter Parsons,  male   DOB: Jul 28, 1960, 59 y.o.   MRN: [id number]

## 2019-05-07 NOTE — Plan of Care (Signed)
Patient encouraged throughout the shift for ambulation however patient refused, however managed to stand patient up on the side of the bed with walker for about 5-10 mins.   -Patient unable to void on his own, bladder scan completed and showed >700, I/O cath performed yielding 877mls.     Problem: Activity: Goal: Risk for activity intolerance will decrease Outcome: Not Progressing   Problem: Pain Managment: Goal: General experience of comfort will improve Outcome: Progressing   Problem: Safety: Goal: Ability to remain free from injury will improve Outcome: Progressing   Problem: Skin Integrity: Goal: Risk for impaired skin integrity will decrease Outcome: Not Progressing

## 2019-05-08 DIAGNOSIS — F200 Paranoid schizophrenia: Secondary | ICD-10-CM | POA: Diagnosis not present

## 2019-05-08 NOTE — Plan of Care (Signed)
Patient OOB to recliner with 1 assist with walker after much persistance from RN. Once patient OOB to recliner ate about 75% of meal. When this RN returned to encourage patient for ambulation he called her "crazy" and kept repeating it. Encouraged patient to stand up to try to void and patient stated he could not pee. I asked patient to stand and he would not stand up for a long time and smacked this RNs hand which was holding the walker. Finally the patient stood back up and would not attempt to pee in the urinal and refused to walk with this RN. Once patient stood he immediately got in the bed. Patient was bladder scanned which showed 658 mls. I/O cath preformed per orders. Patient then called this RN several curse words. MD notified.   Problem: Activity: Goal: Risk for activity intolerance will decrease 05/08/2019 1546 by Frederico Hamman D, RN Outcome: Not Progressing 05/08/2019 1503 by Frederico Hamman D, RN Outcome: Progressing   Problem: Nutrition: Goal: Adequate nutrition will be maintained Outcome: Progressing   Problem: Pain Managment: Goal: General experience of comfort will improve 05/08/2019 1546 by Frederico Hamman D, RN Outcome: Progressing 05/08/2019 1503 by Frederico Hamman D, RN Outcome: Progressing   Problem: Safety: Goal: Ability to remain free from injury will improve 05/08/2019 1546 by Frederico Hamman D, RN Outcome: Progressing 05/08/2019 1503 by Frederico Hamman D, RN Outcome: Progressing   Problem: Nutrition: Goal: Risk of aspiration will decrease Outcome: Progressing

## 2019-05-08 NOTE — Progress Notes (Signed)
Pt was able to have an incontinence episode of urine.

## 2019-05-08 NOTE — Progress Notes (Signed)
[place] at [place] NAME: Peter Parsons    MR#:  [id number]  DATE OF BIRTH:  10/16/1960  SUBJECTIVE:   Patient remains with same flat affect. Patient awake at present drank choc milk when offered Per RN did not walk at all yday. Stood up few mins at side of the bed  REVIEW OF SYSTEMS:   Review of Systems  Unable to perform ROS: Psychiatric disorder   Tolerating Diet:yes Tolerating PT: HHPT patient ambulates using a walker around the nurses station when his mood is stable DRUG ALLERGIES:   Allergies  Allergen Reactions  . Acetaminophen Other (See Comments)    Unknown Unable to breathe Chest  tightness/SOB     VITALS:  Blood pressure 99/66, pulse (!) 56, temperature 98.6 F (37 C), temperature source Oral, resp. rate 16, height 5\' 9"  (1.753 m), weight 74 kg, SpO2 95 %.  PHYSICAL EXAMINATION:   Physical Exam limited exam  GENERAL:  59 y.o.-year-old patient lying in the bed with no acute distress.   LUNGS: Normal breath sounds bilaterally, no wheezing, rales, rhonchi. No use of accessory muscles of respiration.  CARDIOVASCULAR: S1, S2 normal. No murmurs, rubs, or gallops.  ABDOMEN: Soft, nontender, nondistended. Bowel sounds present. No organomegaly or mass.  EXTREMITIES: No cyanosis, clubbing or edema b/l.    NEUROLOGIC: grossly nonfocal. Ambulates with walker  PSYCHIATRIC:  patient is alert with flat affect SKIN: No obvious rash, lesion, or ulcer.   LABORATORY PANEL:  CBC Recent Labs  Lab 05/02/19 0624  WBC 8.0  HGB 10.8*  HCT 32.2*  PLT 146*    Chemistries  Recent Labs  Lab 05/02/19 0624  NA 142  K 3.6  CL 99  CO2 33*  GLUCOSE 102*  BUN 40*  CREATININE 1.17  CALCIUM 9.3   Cardiac Enzymes No results for input(s): TROPONINI in the last 168 hours. RADIOLOGY:  No results found. ASSESSMENT AND PLAN:    Chronic Schizophrenia:w/ frequent suicidal ideation. Continue on paliperidone, klonopin, mirtazapine,  risperidone. Does not have decisional capacity, does have a guardian. Psychiatry requested to follow-up  Depression/Bipolar disorder: continue lexapro, trazodone.  Psychiatry was requested to follow-up patient expressed suicidal ideation to the nurse. Psych will see him today  DM2: controlled. 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   Thrombocytopenia: etiology unclear. Will continue to monitor  plt 146K  Peripheral neuropathy: continue on gabapentin  BPH: continue Flomax  Dyslipidemia: continue statin  HTN: normotensive off all of his antihypertensives possibly secondary to weight loss. Resolved  Dysphagia, dysarthria, left facial droop: CVA ruled out with MRI. Currently still has some dysphagia, pockets foods, but takes in fluids ok. Continue w/ dysphagia diet   Severe Malnutrition: continue on dietary supplements. Continue on mirtazapine    Debilitation: PT recs home health w/ 24 hr supervision/assistance   DVT prophylaxis: lovenox  Code Status: full  Family Communication: none Disposition Plan: CM is still working w/ legal guardian to have pt placed in a group home  Per TOC : RNCM spoke with [name] with [facility], she would like to come assess patient at bedside and reports she may be able to do this on 3/30 but she will let this CM know ahead of time. Adia with [place] has initiated process for working to get him into group home.    TOTAL TIME TAKING CARE OF THIS PATIENT: *20*  minutes.  >50% time spent on counselling and coordination of care  Note: This dictation was prepared with Dragon dictation along with smaller phrase technology. Any transcriptional errors that result from this process are unintentional.  Fritzi Mandes M.D    [facility]   CC: Primary care physician; System, Provider Not InPatient ID: Kaven Deltoro, male   DOB: 04-02-1960, 59 y.o.    MRN: [id number]

## 2019-05-09 DIAGNOSIS — F200 Paranoid schizophrenia: Secondary | ICD-10-CM | POA: Diagnosis not present

## 2019-05-09 MED ORDER — TAMSULOSIN HCL 0.4 MG PO CAPS
0.4000 mg | ORAL_CAPSULE | Freq: Every day | ORAL | Status: DC
  Administered 2019-05-10 – 2019-07-11 (×61): 0.4 mg via ORAL
  Filled 2019-05-09 (×66): qty 1

## 2019-05-09 NOTE — Progress Notes (Signed)
Pt ambulated one lap around unit. Pt requires reminders to keep eyes open while walking. Tolerated well, back to bed. Pt now sitting up, eating meal. WCTM.

## 2019-05-09 NOTE — Progress Notes (Signed)
[place] at [place] NAME: Peter Parsons    MR#:  [id number]  DATE OF BIRTH:  [birth year]  SUBJECTIVE:   Patient remains with same flat affect. Patient awake at present drank choc milk when offered Pt sat in the recliner yday for a bit Needed  In and out cath yday  REVIEW OF SYSTEMS:   Review of Systems  Unable to perform ROS: Psychiatric disorder   Tolerating Diet:yes Tolerating PT: HHPT patient ambulates using a walker around the nurses station when his mood is stable DRUG ALLERGIES:   Allergies  Allergen Reactions  . Acetaminophen Other (See Comments)    Unknown Unable to breathe Chest  tightness/SOB     VITALS:  Blood pressure 104/71, pulse (!) 58, temperature 98.3 F (36.8 C), resp. rate 14, height 5\' 9"  (1.753 m), weight 74 kg, SpO2 97 %.  PHYSICAL EXAMINATION:   Physical Exam limited exam  GENERAL:  59 y.o.-year-old patient lying in the bed with no acute distress.   LUNGS: Normal breath sounds bilaterally, no wheezing, rales, rhonchi. No use of accessory muscles of respiration.  CARDIOVASCULAR: S1, S2 normal. No murmurs, rubs, or gallops.  ABDOMEN: Soft, nontender, nondistended. Bowel sounds present. No organomegaly or mass.  EXTREMITIES: No cyanosis, clubbing or edema b/l.    NEUROLOGIC: grossly nonfocal. Ambulates with walker  PSYCHIATRIC:  patient is alert with flat affect SKIN: No obvious rash, lesion, or ulcer.   LABORATORY PANEL:  CBC No results for input(s): WBC, HGB, HCT, PLT in the last 168 hours.  Chemistries  No results for input(s): NA, K, CL, CO2, GLUCOSE, BUN, CREATININE, CALCIUM, MG, AST, ALT, ALKPHOS, BILITOT in the last 168 hours.  Invalid input(s): GFRCGP Cardiac Enzymes No results for input(s): TROPONINI in the last 168 hours. RADIOLOGY:  No results found. ASSESSMENT AND PLAN:    Chronic Schizophrenia:w/ frequent suicidal ideation. Continue on paliperidone, klonopin, mirtazapine,  risperidone. Does not have decisional capacity, does have a guardian. Psychiatry requested to follow-up  Depression/Bipolar disorder: continue lexapro, trazodone.  Psychiatry was requested to follow-up patient expressed suicidal ideation to the nurse. Psych will see him today  DM2: controlled. 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   Thrombocytopenia: etiology unclear. Will continue to monitor  plt 146K  Peripheral neuropathy: continue on gabapentin  BPH: continue Flomax  Dyslipidemia: continue statin  HTN: normotensive off all of his antihypertensives possibly secondary to weight loss. Resolved  Dysphagia, dysarthria, left facial droop: CVA ruled out with MRI. Currently still has some dysphagia, pockets foods, but takes in fluids ok. Continue w/ dysphagia diet   Severe Malnutrition: continue on dietary supplements. Continue on mirtazapine    Debilitation: PT recs home health w/ 24 hr supervision/assistance   DVT prophylaxis: lovenox  Code Status: full  Family Communication: none Disposition Plan: CM is still working w/ legal guardian to have pt placed in a group home  Per TOC : RNCM spoke with [name] with [facility], she would like to come assess patient at bedside and reports she may be able to do this on 3/30 but she will let this CM know ahead of time. Adia with [place] has initiated process for working to get him into group home.    TOTAL TIME TAKING CARE OF THIS PATIENT: *20* minutes.  >50% time spent on counselling and coordination of care  Note: This dictation was prepared  with Dragon dictation along with smaller phrase technology. Any transcriptional errors that result from this process are unintentional.  Fritzi Mandes M.D    [facility]   CC: Primary care physician; System, Provider Not InPatient ID: Peter Parsons, male   DOB: September 24, 1960, 59 y.o.    MRN: [id number]

## 2019-05-10 DIAGNOSIS — F200 Paranoid schizophrenia: Secondary | ICD-10-CM | POA: Diagnosis not present

## 2019-05-10 LAB — BASIC METABOLIC PANEL
Anion gap: 11 (ref 5–15)
BUN: 40 mg/dL — ABNORMAL HIGH (ref 8–23)
CO2: 31 mmol/L (ref 22–32)
Calcium: 9.4 mg/dL (ref 8.9–10.3)
Chloride: 100 mmol/L (ref 98–111)
Creatinine, Ser: 1.04 mg/dL (ref 0.61–1.24)
GFR calc Af Amer: >60 mL/min (ref >60)
GFR calc non Af Amer: >60 mL/min (ref >60)
Glucose, Bld: 123 mg/dL — ABNORMAL HIGH (ref 70–99)
Potassium: 3.8 mmol/L (ref 3.5–5.1)
Sodium: 142 mmol/L (ref 135–145)

## 2019-05-10 LAB — URINALYSIS, COMPLETE (UACMP) WITH MICROSCOPIC
Bacteria, UA: NONE SEEN
Bilirubin Urine: NEGATIVE
Glucose, UA: NEGATIVE mg/dL
Ketones, ur: NEGATIVE mg/dL
Nitrite: NEGATIVE
Protein, ur: NEGATIVE mg/dL
Specific Gravity, Urine: 1.019 (ref 1.005–1.030)
Squamous Epithelial / HPF: NONE SEEN (ref 0–5)
pH: 6 (ref 5.0–8.0)

## 2019-05-10 LAB — CBC
HCT: 35.1 % — ABNORMAL LOW (ref 39.0–52.0)
Hemoglobin: 11.9 g/dL — ABNORMAL LOW (ref 13.0–17.0)
MCH: 30.3 pg (ref 26.0–34.0)
MCHC: 33.9 g/dL (ref 30.0–36.0)
MCV: 89.3 fL (ref 80.0–100.0)
Platelets: 159 10*3/uL (ref 150–400)
RBC: 3.93 MIL/uL — ABNORMAL LOW (ref 4.22–5.81)
RDW: 13.4 % (ref 11.5–15.5)
WBC: 6 10*3/uL (ref 4.0–10.5)
nRBC: 0 % (ref 0.0–0.2)

## 2019-05-10 MED ORDER — SODIUM CHLORIDE 0.9 % IV SOLN: INTRAVENOUS | Status: AC

## 2019-05-10 MED ORDER — CHLORHEXIDINE GLUCONATE CLOTH 2 % EX PADS
6.0000 | MEDICATED_PAD | Freq: Every day | CUTANEOUS | Status: DC
  Administered 2019-05-10 – 2019-06-13 (×28): 6 via TOPICAL

## 2019-05-10 NOTE — Progress Notes (Signed)
PROGRESS NOTE                                                                                                                                                                                                             Patient Demographics:    Peter Parsons, is a 59 y.o. male, DOB - Apr 17, 1960, [id number]  Admit date - 02/11/2019   Admitting Physician Lavina Hamman, MD  Outpatient Primary MD for the patient is System, Provider Not In  LOS - 16   Chief Complaint  Patient presents with  . ivc       Brief Narrative 59 year old male with chronic schizophrenia with frequent suicidal ideations, bipolar disorder with depression, type 2 diabetes mellitus (controlled), CKD stage II, peripheral neuropathy, BPH, hypertension presented with altered mental status.  Prolonged hospital stay awaiting placement in a memory care unit.  Does not have capacity.   Subjective:   Patient poorly communicative.  No overnight events.   Assessment  & Plan :   Chronic schizophrenia Continue paliperidone, Klonopin, mirtazapine and risperidone.  History of frequent suicidal ideation.  Does not have capacity.  Outpatient psychiatry follow-up.  Depression with bipolar disorder Continue Lexapro and trazodone.  Further psych eval pending.  Chronic kidney disease stage II Stable.   Type 2 diabetes mellitus, controlled A1c of 4.5.  Continue Metformin  Thrombocytopenia Unclear etiology.  Platelet stable.  Peripheral neuropathy Continue gabapentin  BPH Continue Flomax. Had Foley for several days.?  Due to urinary obstruction.  Will discontinue and monitor  Hypertension/ hyperlipidemia Off all blood pressure meds as patient has remained normotensive.  Continue statin.  Dysphagia/dysarthria and left facial droop MRI negative for CVA.  Continue dysphagia diet  Severe protein calorie malnutrition Continue nutrition supplement and  Remeron  Failure to thrive PT recommends home health with 24-hour supervision.  Awaiting placement.  Code Status : Full code  Family Communication  : None  Disposition Plan  : Awaiting legal guardian for placement in a group home.  [facility] to group home staff will come to assess patient possibly on 3/30.   Barriers For Discharge : Awaiting placement  Consults  : Psychiatry  Procedures  :   DVT Prophylaxis  :  Lovenox -   Lab Results  Component Value Date   PLT 146 (L) 05/02/2019    Antibiotics  :  Anti-infectives (From admission, onward)   None        Objective:   Vitals:   05/09/19 2056 05/10/19 0004 05/10/19 0806 05/10/19 1203  BP: 119/78 100/69 100/73 134/83  Pulse: 86 64 (!) 58 89  Resp: 16 16  16   Temp: 99.3 F (37.4 C) 98.6 F (37 C) 98.5 F (36.9 C) 98.2 F (36.8 C)  TempSrc: Oral Oral Oral Oral  SpO2: 97% 94% 95% 95%  Weight:      Height:        Wt Readings from Last 3 Encounters:  05/04/19 74 kg  01/25/19 90.3 kg  12/14/18 120.2 kg     Intake/Output Summary (Last 24 hours) at 05/10/2019 1212 Last data filed at 05/10/2019 1100 Gross per 24 hour  Intake 237 ml  Output 1875 ml  Net -1638 ml     Physical Exam  Gen: not in distress, poorly communicative HEENT moist mucosa, supple neck Chest: clear b/l, no added sounds CVS: N S1&S2, no murmurs GI: soft, NT, ND,, Foley + Musculoskeletal: warm, no edema     Data Review:    CBC No results for input(s): WBC, HGB, HCT, PLT, MCV, MCH, MCHC, RDW, LYMPHSABS, MONOABS, EOSABS, BASOSABS, BANDABS in the last 168 hours.  Invalid input(s): NEUTRABS, BANDSABD  Chemistries  No results for input(s): NA, K, CL, CO2, GLUCOSE, BUN, CREATININE, CALCIUM, MG, AST, ALT, ALKPHOS, BILITOT in the last 168 hours.  Invalid input(s): GFRCGP ------------------------------------------------------------------------------------------------------------------ No results for input(s): CHOL, HDL, LDLCALC, TRIG,  CHOLHDL, LDLDIRECT in the last 72 hours.  Lab Results  Component Value Date   HGBA1C 4.5 (L) 03/28/2019   ------------------------------------------------------------------------------------------------------------------ No results for input(s): TSH, T4TOTAL, T3FREE, THYROIDAB in the last 72 hours.  Invalid input(s): FREET3 ------------------------------------------------------------------------------------------------------------------ No results for input(s): VITAMINB12, FOLATE, FERRITIN, TIBC, IRON, RETICCTPCT in the last 72 hours.  Coagulation profile No results for input(s): INR, PROTIME in the last 168 hours.  No results for input(s): DDIMER in the last 72 hours.  Cardiac Enzymes No results for input(s): CKMB, TROPONINI, MYOGLOBIN in the last 168 hours.  Invalid input(s): CK ------------------------------------------------------------------------------------------------------------------ No results found for: BNP  Inpatient Medications  Scheduled Meds: . atorvastatin  10 mg Oral Daily  . chlorhexidine  15 mL Mouth/Throat BID  . Chlorhexidine Gluconate Cloth  6 each Topical Daily  . collagenase   Topical Daily  . enoxaparin (LOVENOX) injection  40 mg Subcutaneous Q24H  . escitalopram  10 mg Oral Daily  . feeding supplement (NEPRO CARB STEADY)  237 mL Oral TID BM  . gabapentin  300 mg Oral TID  . mirtazapine  45 mg Oral QHS  . multivitamin with minerals  1 tablet Oral Daily  . risperiDONE  3 mg Oral BID  . senna-docusate  1 tablet Oral Daily  . tamsulosin  0.4 mg Oral Daily  . traZODone  150 mg Oral QHS   Continuous Infusions: PRN Meds:.clonazePAM, dextrose, ibuprofen, metoprolol tartrate, senna-docusate  Micro Results No results found for this or any previous visit (from the past 240 hour(s)).  Radiology Reports DG Chest 2 View  Result Date: 04/28/2019 CLINICAL DATA:  Fever.  Unresponsive. EXAM: CHEST - 2 VIEW COMPARISON:  02/12/2019 FINDINGS: The heart size  and mediastinal contours are within normal limits. Both lungs are clear. The visualized skeletal structures are unremarkable. IMPRESSION: No active cardiopulmonary disease. Electronically Signed   By: Kerby Moors M.D.   On: 04/28/2019 10:00    Time Spent in minutes 25   Jesyka Slaght M.D on  05/10/2019 at 12:12 PM  Between 7am to 7pm - Pager - [phone]  After 7pm go to [email or web address] - password [facility]  [facility] -  Office  [phone]

## 2019-05-10 NOTE — Progress Notes (Signed)
Per order of Dr. Clementeen Graham , remove foley catheter and encourage PO fluids for patient and encourage urinal use . Foley removed without difficulty

## 2019-05-10 NOTE — Progress Notes (Signed)
Pt bladder scanned approx. 12:30am with 735cc noted upon scan, Sharion Settler, NP notified of findings, NP informed pt not voiding, of multiple in and out catheterizations performed on pt throughout week and discussed if foley placement appropriate at this time, order for foley received from NP, foley placed and secured to LUE via statlock, foley placement well tolerated by pt, foley noted patent and draining well.

## 2019-05-10 NOTE — Progress Notes (Signed)
Patient appears to be tachy Pulse 125, MEWS 2 started MEWS protocol and notified Dr. Clementeen Graham

## 2019-05-10 NOTE — Progress Notes (Signed)
Patient had 403. I/O cath with 14 [place] , 300cc of amber colored urine obtained for U/A . Patient tolerated well. Small amount of sediment noted in urine. New IV to RFA is patent and NS 75cc.hour infusing at this time

## 2019-05-10 NOTE — Plan of Care (Signed)
  Problem: Nutrition: Goal: Adequate nutrition will be maintained Outcome: Progressing   Problem: Pain Managment: Goal: General experience of comfort will improve Outcome: Progressing   Problem: Safety: Goal: Ability to remain free from injury will improve Outcome: Progressing   

## 2019-05-11 DIAGNOSIS — F200 Paranoid schizophrenia: Secondary | ICD-10-CM | POA: Diagnosis not present

## 2019-05-11 NOTE — Progress Notes (Signed)
Nutrition Follow-up  DOCUMENTATION CODES:   Severe malnutrition in context of social or environmental circumstances  INTERVENTION:  Continue Nepro po TID, each supplement provides 425 kcal and 19 grams of protein  Continue MVI daily  NUTRITION DIAGNOSIS:   Severe Malnutrition related to social / environmental circumstances(inadequate oral intake) as evidenced by percent weight loss, moderate fat depletion, moderate muscle depletion, severe muscle depletion.  Ongoing - addressing with ONS  GOAL:   Patient will meet greater than or equal to 90% of their needs  Met with ONS  MONITOR:   PO intake, Supplement acceptance, Labs, Weight trends, I & O's  REASON FOR ASSESSMENT:   Malnutrition Screening Tool    ASSESSMENT:  RD working remotely.  59 year old male with PMHx of schizophrenia, HTN, DM who has been in [facility] for [facility] days and is now admitted with generalized weakness, dysphagia, slurred speech, left facial droop to rule out CVA.  RD attempted to reach pt via phone this morning, however pt did not answer. Per chart, pt po intakes have declined over the last few days. On 3/28 he at 30% of breakfast (~337 kcal, 15 grams of protein) and 40% of lunch (~432 kcal, 20 grams of protein). He ate 30% of breakfast this morning (~337 kcal, 15 grams of protein) No documented meals for 3/29 available for review. Patient is consuming Nepro supplements TID, providing him with an additional 1275 kcal and 57 grams of protein daily to aid with meeting needs.   Mild-pitting BUE, BLE edema noted on 3/29 flowsheet. No new weights in the past 7 days, recommend obtaining current wt to assess trends.  Medications reviewed and include: Gabapentin, Remeron 45 mg daily, MVI, Risperdal, Senokot  Reviewed 3/29 labs   Diet Order:   Diet Order            DIET - DYS 1 Room service appropriate? Yes with Assist; Fluid consistency: Thin  Diet effective now              EDUCATION NEEDS:   No  education needs have been identified at this time  Skin:  Skin Assessment: Skin Integrity Issues:(unstageable to coccyx; DTIs to bilateral heels and right ankle)  Last BM:  3/30 type 1 (small)  Height:   Ht Readings from Last 1 Encounters:  03/28/19 5' 9"  (1.753 m)    Weight:   Wt Readings from Last 1 Encounters:  05/04/19 74 kg    Ideal Body Weight:  72.7 kg  BMI:  Body mass index is 24.09 kg/m.  Estimated Nutritional Needs:   Kcal:  2000-2200  Protein:  100-110 grams  Fluid:  >/= 2 L/day    Lajuan Lines, RD, LDN Clinical Nutrition After Hours/Weekend Pager # in [place]

## 2019-05-11 NOTE — TOC Progression Note (Signed)
Transition of Care [facility]) - Progression Note    Patient Details  Name: Peter Parsons MRN: [id number] Date of Birth: 08-18-1960  Transition of [facility]) CM/SW Contact  Shelbie Ammons, RN Phone Number: 05/11/2019, 2:46 PM  Clinical Narrative:   RNCM met with [name] from [facility] group homes for assessment of patient. Patient was sitting up in recliner upon arrival and was minimally talkative throughout visit. Patient reports to feeling ok today however spent most of visit talking about how he is cursed. Patient was able to ambulate with much encouragement around in room using walker. [name] reported to this CM that she would get back to [place] with her decision about whether or not she could take patient.     Expected Discharge Plan: Group Home Barriers to Discharge: SNF Pending bed offer  Expected Discharge Plan and Services Expected Discharge Plan: Group Home   Discharge Planning Services: CM Consult   Living arrangements for the past 2 months: Group Home                 DME Arranged: N/A DME Agency: [employer] Date DME Agency Contacted: 04/12/19 Time DME Agency Contacted: [phone] Representative spoke with at DME Agency: none needed [place] Arranged: NA           Social Determinants of Health ([place]) Interventions    Readmission Risk Interventions Readmission Risk Prevention Plan 04/27/2019 04/20/2019  Transportation Screening Complete Complete  PCP or Specialist Appt within 3-5 Days - Not Complete  Not Complete comments - not ready to [place] or Home Care Consult - Not Complete  HRI or Home Care Consult comments - not ready to[place], looking for a bed for placement  Social Work Consult for [place] Planning/Counseling - [place] - Not Applicable  Medication Review (RN Care Manager) Referral to Pharmacy Referral to Pharmacy  PCP or Specialist appointment within 3-5 days of discharge Complete -  [place] or [place] Complete -  [place] Not Applicable -

## 2019-05-11 NOTE — TOC Progression Note (Signed)
Transition of Care [facility]) - Progression Note    Patient Details  Name: Aldon Deveny MRN: [id number] Date of Birth: 1960-06-24  Transition of Care [facility]) CM/SW [place], [place] Phone Number: 05/11/2019, 9:18 AM  Clinical Narrative:    Spoke with [name] at [facility] [phone], about patient placement.  [name] stated she will come by on 05/11/3019 to evaluate patient for placement.  [name] inquired about patient mobility/ambulation and level of dementia.  This CSW informed [name] the patient is ambulatory with direction.  [name] stated she will contact [employer] RN/CM, when she arrives to see the patient.  This CSW contact Nurse, adult and left voicemail, for status update.   Expected Discharge Plan: Group Home Barriers to Discharge: SNF Pending bed offer  Expected Discharge Plan and Services Expected Discharge Plan: Group Home   Discharge Planning Services: CM Consult   Living arrangements for the past 2 months: Group Home                 DME Arranged: N/A DME Agency: [employer] Date DME Agency Contacted: 04/12/19 Time DME Agency Contacted: [phone] Representative spoke with at DME Agency: none needed [place] Arranged: NA           Social Determinants of Health ([place]) Interventions    Readmission Risk Interventions Readmission Risk Prevention Plan 04/27/2019 04/20/2019  Transportation Screening Complete Complete  PCP or Specialist Appt within 3-5 Days - Not Complete  Not Complete comments - not ready to [place] or Home Care Consult - Not Complete  HRI or Home Care Consult comments - not ready to[place], looking for a bed for placement  Social Work Consult for [place] Planning/Counseling - [place] - Not Applicable  Medication Review (RN Care Manager) Referral to Pharmacy Referral to Pharmacy  PCP or Specialist appointment within 3-5 days of discharge Complete -  [place] or [place] Complete -  [place] Not Applicable -

## 2019-05-11 NOTE — Progress Notes (Signed)
PROGRESS NOTE                                                                                                                                                                                                             Patient Demographics:    Peter Parsons, is a 59 y.o. male, DOB - 03/12/1960, [id number]  Admit date - 02/11/2019   Admitting Physician Lavina Hamman, MD  Outpatient Primary MD for the patient is System, Provider Not In  LOS - 57   Chief Complaint  Patient presents with  . ivc       Brief Narrative 59 year old male with chronic schizophrenia with frequent suicidal ideations, bipolar disorder with depression, type 2 diabetes mellitus (controlled), CKD stage II, peripheral neuropathy, BPH, hypertension presented with altered mental status.  Prolonged hospital stay awaiting placement in a memory care unit.  Does not have capacity.   Subjective:   Patient tachycardic yesterday to 120s.  Foley removed and found to have 400 cc urine on bladder scan, I/O done.  Feels better today.   Assessment  & Plan :   Chronic schizophrenia Continue paliperidone, Klonopin, mirtazapine and risperidone.  History of frequent suicidal ideation.  Does not have capacity.  Outpatient psychiatry follow-up.  Depression with bipolar disorder Continue Lexapro and trazodone.  Further psych eval pending.  Chronic kidney disease stage II Stable.   Type 2 diabetes mellitus, controlled A1c of 4.5.  Continue Metformin  Thrombocytopenia Unclear etiology.  Platelet stable.  Peripheral neuropathy Continue gabapentin  BPH Continue Flomax. Foley removed, had to do I/O yesterday.  Will monitor for further urinary retention.  Hypertension/ hyperlipidemia Off all blood pressure meds as patient has remained normotensive.  Continue statin.  Dysphagia/dysarthria and left facial droop MRI negative for CVA.  Continue dysphagia  diet  Severe protein calorie malnutrition Continue nutrition supplement and Remeron  Failure to thrive PT recommends home health with 24-hour supervision.  Awaiting placement.  Code Status : Full code  Family Communication  : None  Disposition Plan  : Awaiting legal guardian for placement in a group home.  AMA to group home staff will come to assess patient possibly today.   Barriers For Discharge : Awaiting placement  Consults  : Psychiatry  Procedures  :   DVT Prophylaxis  :  Lovenox -   Lab Results  Component Value Date   PLT 159 05/10/2019    Antibiotics  :   Anti-infectives (From admission, onward)   None        Objective:   Vitals:   05/10/19 1943 05/10/19 2354 05/11/19 0348 05/11/19 0748  BP: 130/72 102/68 115/76 123/87  Pulse: 100 74 77 86  Resp: 17 17 17 19   Temp: 98.8 F (37.1 C) 100.1 F (37.8 C) 99.4 F (37.4 C) 98.8 F (37.1 C)  TempSrc: Oral Oral Oral Oral  SpO2: 97% 96% 97% 97%  Weight:      Height:        Wt Readings from Last 3 Encounters:  05/04/19 74 kg  01/25/19 90.3 kg  12/14/18 120.2 kg     Intake/Output Summary (Last 24 hours) at 05/11/2019 1208 Last data filed at 05/11/2019 1014 Gross per 24 hour  Intake 1902.55 ml  Output 300 ml  Net 1602.55 ml    Physical exam Not in distress, poorly communicative HEENT: Moist mucosa, supple neck Chest: Clear CVs: Normal S1-S2 GI: Soft, nontender, nondistended Musculoskeletal: Warm, no edema      Data Review:    CBC Recent Labs  Lab 05/10/19 1719  WBC 6.0  HGB 11.9*  HCT 35.1*  PLT 159  MCV 89.3  MCH 30.3  MCHC 33.9  RDW 13.4    Chemistries  Recent Labs  Lab 05/10/19 1719  NA 142  K 3.8  CL 100  CO2 31  GLUCOSE 123*  BUN 40*  CREATININE 1.04  CALCIUM 9.4   ------------------------------------------------------------------------------------------------------------------ No results for input(s): CHOL, HDL, LDLCALC, TRIG, CHOLHDL, LDLDIRECT in the last  72 hours.  Lab Results  Component Value Date   HGBA1C 4.5 (L) 03/28/2019   ------------------------------------------------------------------------------------------------------------------ No results for input(s): TSH, T4TOTAL, T3FREE, THYROIDAB in the last 72 hours.  Invalid input(s): FREET3 ------------------------------------------------------------------------------------------------------------------ No results for input(s): VITAMINB12, FOLATE, FERRITIN, TIBC, IRON, RETICCTPCT in the last 72 hours.  Coagulation profile No results for input(s): INR, PROTIME in the last 168 hours.  No results for input(s): DDIMER in the last 72 hours.  Cardiac Enzymes No results for input(s): CKMB, TROPONINI, MYOGLOBIN in the last 168 hours.  Invalid input(s): CK ------------------------------------------------------------------------------------------------------------------ No results found for: BNP  Inpatient Medications  Scheduled Meds: . atorvastatin  10 mg Oral Daily  . chlorhexidine  15 mL Mouth/Throat BID  . Chlorhexidine Gluconate Cloth  6 each Topical Daily  . collagenase   Topical Daily  . enoxaparin (LOVENOX) injection  40 mg Subcutaneous Q24H  . escitalopram  10 mg Oral Daily  . feeding supplement (NEPRO CARB STEADY)  237 mL Oral TID BM  . gabapentin  300 mg Oral TID  . mirtazapine  45 mg Oral QHS  . multivitamin with minerals  1 tablet Oral Daily  . risperiDONE  3 mg Oral BID  . senna-docusate  1 tablet Oral Daily  . tamsulosin  0.4 mg Oral Daily  . traZODone  150 mg Oral QHS   Continuous Infusions: . sodium chloride 100 mL/hr at 05/10/19 1803   PRN Meds:.clonazePAM, dextrose, ibuprofen, metoprolol tartrate, senna-docusate  Micro Results No results found for this or any previous visit (from the past 240 hour(s)).  Radiology Reports DG Chest 2 View  Result Date: 04/28/2019 CLINICAL DATA:  Fever.  Unresponsive. EXAM: CHEST - 2 VIEW COMPARISON:  02/12/2019  FINDINGS: The heart size and mediastinal contours are within normal limits. Both lungs are clear. The visualized skeletal structures are unremarkable. IMPRESSION: No active cardiopulmonary disease. Electronically Signed  By: Kerby Moors M.D.   On: 04/28/2019 10:00    Time Spent in minutes 20   Lametria Klunk M.D on 05/11/2019 at 12:08 PM  Between 7am to 7pm - Pager - [phone]  After 7pm go to [email or web address] - password [facility]  [facility] -  Office  [phone]

## 2019-05-12 DIAGNOSIS — R410 Disorientation, unspecified: Secondary | ICD-10-CM | POA: Diagnosis not present

## 2019-05-12 NOTE — Progress Notes (Signed)
PROGRESS NOTE                                                                                                                                                                                                             Patient Demographics:    Peter Parsons, is a 59 y.o. male, DOB - February 05, 1961, [id number]  Admit date - 02/11/2019   Admitting Physician Lavina Hamman, MD  Outpatient Primary MD for the patient is System, Provider Not In  LOS - 67   Chief Complaint  Patient presents with  . ivc       Brief Narrative 59 year old male with chronic schizophrenia with frequent suicidal ideations, bipolar disorder with depression, type 2 diabetes mellitus (controlled), CKD stage II, peripheral neuropathy, BPH, hypertension presented with altered mental status.  Prolonged hospital stay awaiting placement in a memory care unit.  Does not have capacity.   Subjective:   No overnight events.  Unclear if patient has been voiding without difficulty.   Assessment  & Plan :   Chronic schizophrenia Continue paliperidone, Klonopin, mirtazapine and risperidone.  History of frequent suicidal ideation.  Does not have capacity.  Outpatient psychiatry follow-up.  Depression with bipolar disorder Continue Lexapro and trazodone.  Further psych eval pending.  Chronic kidney disease stage II Stable.   Type 2 diabetes mellitus, controlled A1c of 4.5.  Continue Metformin  Thrombocytopenia Unclear etiology.  Platelet stable.  Peripheral neuropathy Continue gabapentin  BPH Continue Flomax. Foley removed.  bladder scan to check urinary retention.  Hypertension/ hyperlipidemia Off all blood pressure meds as patient has remained normotensive.  Continue statin.  Dysphagia/dysarthria and left facial droop MRI negative for CVA.  Continue dysphagia diet  Severe protein calorie malnutrition Continue nutrition supplement and  Remeron  Failure to thrive PT recommends home health with 24-hour supervision.  Awaiting placement.  Code Status : Full code  Family Communication  : None  Disposition Plan  : Awaiting legal guardian for placement in a group home.  Group home staff assessed the patient yesterday.  Awaiting response.   Barriers For Discharge : Awaiting placement  Consults  : Psychiatry  Procedures  :   DVT Prophylaxis  :  Lovenox -   Lab Results  Component Value Date   PLT 159 05/10/2019    Antibiotics  :   Anti-infectives (From admission,  onward)   None        Objective:   Vitals:   05/11/19 2321 05/12/19 0422 05/12/19 0742 05/12/19 1146  BP: 103/68 106/62 112/70 131/82  Pulse: 63 70 76 (!) 110  Resp: 16 16 17 17   Temp: 98.4 F (36.9 C) 98.6 F (37 C) 99.2 F (37.3 C) 99.2 F (37.3 C)  TempSrc:   Oral Oral  SpO2: 98% 96% 96% 96%  Weight:      Height:        Wt Readings from Last 3 Encounters:  05/04/19 74 kg  01/25/19 90.3 kg  12/14/18 120.2 kg     Intake/Output Summary (Last 24 hours) at 05/12/2019 1233 Last data filed at 05/12/2019 0422 Gross per 24 hour  Intake 311.99 ml  Output 520 ml  Net -208.01 ml    Physical exam Not in distress, poorly communicative HEENT: Moist mucosa, supple neck Chest: Clear CVs: Normal S1-S2 GI: Soft, nontender, nondistended Musculoskeletal: Warm, no edema      Data Review:    CBC Recent Labs  Lab 05/10/19 1719  WBC 6.0  HGB 11.9*  HCT 35.1*  PLT 159  MCV 89.3  MCH 30.3  MCHC 33.9  RDW 13.4    Chemistries  Recent Labs  Lab 05/10/19 1719  NA 142  K 3.8  CL 100  CO2 31  GLUCOSE 123*  BUN 40*  CREATININE 1.04  CALCIUM 9.4   ------------------------------------------------------------------------------------------------------------------ No results for input(s): CHOL, HDL, LDLCALC, TRIG, CHOLHDL, LDLDIRECT in the last 72 hours.  Lab Results  Component Value Date   HGBA1C 4.5 (L) 03/28/2019    ------------------------------------------------------------------------------------------------------------------ No results for input(s): TSH, T4TOTAL, T3FREE, THYROIDAB in the last 72 hours.  Invalid input(s): FREET3 ------------------------------------------------------------------------------------------------------------------ No results for input(s): VITAMINB12, FOLATE, FERRITIN, TIBC, IRON, RETICCTPCT in the last 72 hours.  Coagulation profile No results for input(s): INR, PROTIME in the last 168 hours.  No results for input(s): DDIMER in the last 72 hours.  Cardiac Enzymes No results for input(s): CKMB, TROPONINI, MYOGLOBIN in the last 168 hours.  Invalid input(s): CK ------------------------------------------------------------------------------------------------------------------ No results found for: BNP  Inpatient Medications  Scheduled Meds: . atorvastatin  10 mg Oral Daily  . chlorhexidine  15 mL Mouth/Throat BID  . Chlorhexidine Gluconate Cloth  6 each Topical Daily  . collagenase   Topical Daily  . enoxaparin (LOVENOX) injection  40 mg Subcutaneous Q24H  . escitalopram  10 mg Oral Daily  . feeding supplement (NEPRO CARB STEADY)  237 mL Oral TID BM  . gabapentin  300 mg Oral TID  . mirtazapine  45 mg Oral QHS  . multivitamin with minerals  1 tablet Oral Daily  . risperiDONE  3 mg Oral BID  . senna-docusate  1 tablet Oral Daily  . tamsulosin  0.4 mg Oral Daily  . traZODone  150 mg Oral QHS   Continuous Infusions:  PRN Meds:.clonazePAM, dextrose, ibuprofen, metoprolol tartrate, senna-docusate  Micro Results No results found for this or any previous visit (from the past 240 hour(s)).  Radiology Reports DG Chest 2 View  Result Date: 04/28/2019 CLINICAL DATA:  Fever.  Unresponsive. EXAM: CHEST - 2 VIEW COMPARISON:  02/12/2019 FINDINGS: The heart size and mediastinal contours are within normal limits. Both lungs are clear. The visualized skeletal structures  are unremarkable. IMPRESSION: No active cardiopulmonary disease. Electronically Signed   By: Kerby Moors M.D.   On: 04/28/2019 10:00    Time Spent in minutes 20   Rekia Kujala M.D on 05/12/2019  at 12:33 PM  Between 7am to 7pm - Pager - [phone]  After 7pm go to [email or web address] - password [facility]  [facility] -  Office  [phone]
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
## 2019-05-13 ENCOUNTER — Inpatient Hospital Stay: Payer: Medicaid Other

## 2019-05-13 DIAGNOSIS — F200 Paranoid schizophrenia: Secondary | ICD-10-CM | POA: Diagnosis not present

## 2019-05-13 MED ORDER — PRO-STAT SUGAR FREE PO LIQD
30.0000 mL | Freq: Two times a day (BID) | ORAL | Status: DC
  Administered 2019-05-13 – 2019-06-27 (×81): 30 mL via ORAL

## 2019-05-13 NOTE — Progress Notes (Signed)
Patient ambulatory today 349ft ,. Unable to void new order for urinary catheter 16Fr , 788cc on bladder scan and 600cc into the foley bag, secured to left leg. Patient had ultrasound showing cyst to right kidney . Enlarged spleen, Patient able to feed self with tray set up help. New order for wet to dry dressing to stage II on coccyx m changed and dressing is C/D/I . Heels floating patient turned and repositioned throughout shift,

## 2019-05-13 NOTE — Progress Notes (Signed)
PROGRESS NOTE                                                                                                                                                                                                             Patient Demographics:    Peter Parsons, is a 59 y.o. male, DOB - Feb 22, 1960, [id number]  Admit date - 02/11/2019   Admitting Physician Lavina Hamman, MD  Outpatient Primary MD for the patient is System, Provider Not In  LOS - 20   Chief Complaint  Patient presents with  . ivc       Brief Narrative 59 year old male with chronic schizophrenia with frequent suicidal ideations, bipolar disorder with depression, type 2 diabetes mellitus (controlled), CKD stage II, peripheral neuropathy, BPH, hypertension presented with altered mental status.  Prolonged hospital stay awaiting placement in a memory care unit.  Does not have capacity.   Subjective:   Again having urinary retention requiring straight cath.  No other overnight events.  Assessment  & Plan :   Chronic schizophrenia Continue paliperidone, Klonopin, mirtazapine and risperidone.  History of frequent suicidal ideation.  Does not have capacity.  Outpatient psychiatry follow-up.  Depression with bipolar disorder Continue Lexapro and trazodone.  Chronic kidney disease stage II Stable.   Type 2 diabetes mellitus, controlled A1c of 4.5.  Continue Metformin  Thrombocytopenia Unclear etiology.  Platelet stable.  Peripheral neuropathy Continue gabapentin  BPH With urinary retention.  Continue Flomax. Foley was removed but patient has been retaining urine.  Check renal ultrasound.  Foley placed back in.  If renal ultrasound unremarkable will need to be discharged on Foley with outpatient urology follow-up.  Hypertension/ hyperlipidemia Off all blood pressure meds as patient has remained normotensive.  Continue statin.  Dysphagia/dysarthria  and left facial droop MRI negative for CVA.  Continue dysphagia diet  Severe protein calorie malnutrition Continue nutrition supplement and Remeron  Failure to thrive PT recommends home health with 24-hour supervision.  Awaiting placement.  Code Status : Full code  Family Communication  : None  Disposition Plan  : Was not accepted to group home.  Referral made to SNF.  Barriers For Discharge : Awaiting placement  Consults  : Psychiatry  Procedures  :   DVT Prophylaxis  :  Lovenox -   Lab Results  Component Value Date   PLT  159 05/10/2019    Antibiotics  :   Anti-infectives (From admission, onward)   None        Objective:   Vitals:   05/12/19 2028 05/12/19 2332 05/13/19 0402 05/13/19 0845  BP: 114/87 114/77 107/74 108/78  Pulse: 89 80 67 68  Resp:      Temp: 98.7 F (37.1 C) 98.2 F (36.8 C) 98.8 F (37.1 C) 98.6 F (37 C)  TempSrc: Axillary Oral Oral Oral  SpO2: 97% 96% 96% 97%  Weight:      Height:        Wt Readings from Last 3 Encounters:  05/04/19 74 kg  01/25/19 90.3 kg  12/14/18 120.2 kg     Intake/Output Summary (Last 24 hours) at 05/13/2019 1714 Last data filed at 05/13/2019 1600 Gross per 24 hour  Intake 240 ml  Output 200 ml  Net 40 ml   Physical exam Not in distress, disheveled HEENT: Moist mucosa Chest: Clear CVs: Normal S1-S2 GI: Soft, nondistended, nontender Musculoskeletal: Warm      Data Review:    CBC Recent Labs  Lab 05/10/19 1719  WBC 6.0  HGB 11.9*  HCT 35.1*  PLT 159  MCV 89.3  MCH 30.3  MCHC 33.9  RDW 13.4    Chemistries  Recent Labs  Lab 05/10/19 1719  NA 142  K 3.8  CL 100  CO2 31  GLUCOSE 123*  BUN 40*  CREATININE 1.04  CALCIUM 9.4   ------------------------------------------------------------------------------------------------------------------ No results for input(s): CHOL, HDL, LDLCALC, TRIG, CHOLHDL, LDLDIRECT in the last 72 hours.  Lab Results  Component Value Date   HGBA1C 4.5  (L) 03/28/2019   ------------------------------------------------------------------------------------------------------------------ No results for input(s): TSH, T4TOTAL, T3FREE, THYROIDAB in the last 72 hours.  Invalid input(s): FREET3 ------------------------------------------------------------------------------------------------------------------ No results for input(s): VITAMINB12, FOLATE, FERRITIN, TIBC, IRON, RETICCTPCT in the last 72 hours.  Coagulation profile No results for input(s): INR, PROTIME in the last 168 hours.  No results for input(s): DDIMER in the last 72 hours.  Cardiac Enzymes No results for input(s): CKMB, TROPONINI, MYOGLOBIN in the last 168 hours.  Invalid input(s): CK ------------------------------------------------------------------------------------------------------------------ No results found for: BNP  Inpatient Medications  Scheduled Meds: . atorvastatin  10 mg Oral Daily  . chlorhexidine  15 mL Mouth/Throat BID  . Chlorhexidine Gluconate Cloth  6 each Topical Daily  . enoxaparin (LOVENOX) injection  40 mg Subcutaneous Q24H  . escitalopram  10 mg Oral Daily  . feeding supplement (NEPRO CARB STEADY)  237 mL Oral TID BM  . feeding supplement (PRO-STAT SUGAR FREE 64)  30 mL Oral BID  . gabapentin  300 mg Oral TID  . mirtazapine  45 mg Oral QHS  . multivitamin with minerals  1 tablet Oral Daily  . risperiDONE  3 mg Oral BID  . senna-docusate  1 tablet Oral Daily  . tamsulosin  0.4 mg Oral Daily  . traZODone  150 mg Oral QHS   Continuous Infusions:  PRN Meds:.clonazePAM, dextrose, ibuprofen, metoprolol tartrate, senna-docusate  Micro Results No results found for this or any previous visit (from the past 240 hour(s)).  Radiology Reports DG Chest 2 View  Result Date: 04/28/2019 CLINICAL DATA:  Fever.  Unresponsive. EXAM: CHEST - 2 VIEW COMPARISON:  02/12/2019 FINDINGS: The heart size and mediastinal contours are within normal limits. Both  lungs are clear. The visualized skeletal structures are unremarkable. IMPRESSION: No active cardiopulmonary disease. Electronically Signed   By: Kerby Moors M.D.   On: 04/28/2019 10:00  Time Spent in minutes 20   Anabia Weatherwax M.D on 05/13/2019 at 5:14 PM  Between 7am to 7pm - Pager - [phone]  After 7pm go to [email or web address] - password [facility]  [facility] -  Office  [phone]

## 2019-05-13 NOTE — TOC Progression Note (Signed)
Transition of Care [facility]) - Progression Note    Patient Details  Name: Peter Parsons MRN: [id number] Date of Birth: Jun 02, 1960  [facility]) CM/SW Contact  Shelbie Ammons, RN Phone Number: 05/13/2019, 10:39 AM  Clinical Narrative:   RNCM received communication from [place] with [place] that patient will not be able to go to [facility] group home, the director does not think he would be a good fit for their facility and would need a more care than the rest of her residents. Adia requests additional clinicals be faxed to her. Update give to Aida Raider, RN.     Expected Discharge Plan: Group Home Barriers to Discharge: SNF Pending bed offer  Expected Discharge Plan and Services Expected Discharge Plan: Group Home   Discharge Planning Services: CM Consult   Living arrangements for the past 2 months: Group Home                 DME Arranged: N/A DME Agency: [employer] Date DME Agency Contacted: 04/12/19 Time DME Agency Contacted: [phone] Representative spoke with at DME Agency: none needed [place] Arranged: NA           Social Determinants of Health ([place]) Interventions    Readmission Risk Interventions Readmission Risk Prevention Plan 04/27/2019 04/20/2019  Transportation Screening Complete Complete  PCP or Specialist Appt within 3-5 Days - Not Complete  Not Complete comments - not ready to [place] or Home Care Consult - Not Complete  HRI or Home Care Consult comments - not ready toDC, looking for a bed for placement  Social Work Consult for [place] Planning/Counseling - [place] - Not Applicable  Medication Review (RN Care Manager) Referral to Pharmacy Referral to Pharmacy  PCP or Specialist appointment within 3-5 days of discharge Complete -  [place] or [place] Complete -  [place] Not Applicable -

## 2019-05-13 NOTE — TOC Progression Note (Signed)
Transition of Care [facility]) - Progression Note    Patient Details  Name: Peter Parsons MRN: [id number] Date of Birth: [birth year]  Transition of Care [facility]) CM/SW [place], [place] Phone Number: [phone] 05/13/2019, 11:06 AM  Clinical Narrative:    Request from Jhonnie Garner RN/CM, faxed 3 days of clinical notes to Vanna [place] at [facility] fax [phone].   Expected Discharge Plan: Group Home Barriers to Discharge: SNF Pending bed offer  Expected Discharge Plan and Services Expected Discharge Plan: Group Home   Discharge Planning Services: CM Consult   Living arrangements for the past 2 months: Group Home                 DME Arranged: N/A DME Agency: [employer] Date DME Agency Contacted: 04/12/19 Time DME Agency Contacted: [phone] Representative spoke with at DME Agency: none needed [place] Arranged: NA           Social Determinants of Health ([place]) Interventions    Readmission Risk Interventions Readmission Risk Prevention Plan 04/27/2019 04/20/2019  Transportation Screening Complete Complete  PCP or Specialist Appt within 3-5 Days - Not Complete  Not Complete comments - not ready to [place] or Home Care Consult - Not Complete  HRI or Home Care Consult comments - not ready toDC, looking for a bed for placement  Social Work Consult for [place] Planning/Counseling - [place] - Not Applicable  Medication Review (RN Care Manager) Referral to Pharmacy Referral to Pharmacy  PCP or Specialist appointment within 3-5 days of discharge Complete -  [place] or [place] Complete -  [place] Not Applicable -

## 2019-05-14 ENCOUNTER — Inpatient Hospital Stay: Payer: Medicaid Other

## 2019-05-14 DIAGNOSIS — R338 Other retention of urine: Secondary | ICD-10-CM

## 2019-05-14 DIAGNOSIS — R310 Gross hematuria: Secondary | ICD-10-CM

## 2019-05-14 DIAGNOSIS — F200 Paranoid schizophrenia: Secondary | ICD-10-CM | POA: Diagnosis not present

## 2019-05-14 LAB — PREPARE RBC (CROSSMATCH)

## 2019-05-14 LAB — CBC
HCT: 34.8 % — ABNORMAL LOW (ref 39.0–52.0)
Hemoglobin: 11.9 g/dL — ABNORMAL LOW (ref 13.0–17.0)
MCH: 30.4 pg (ref 26.0–34.0)
MCHC: 34.2 g/dL (ref 30.0–36.0)
MCV: 89 fL (ref 80.0–100.0)
Platelets: 146 10*3/uL — ABNORMAL LOW (ref 150–400)
RBC: 3.91 MIL/uL — ABNORMAL LOW (ref 4.22–5.81)
RDW: 13.7 % (ref 11.5–15.5)
WBC: 8.8 10*3/uL (ref 4.0–10.5)
nRBC: 0 % (ref 0.0–0.2)

## 2019-05-14 LAB — URINALYSIS, COMPLETE (UACMP) WITH MICROSCOPIC
Bilirubin Urine: NEGATIVE
Glucose, UA: NEGATIVE mg/dL
Ketones, ur: NEGATIVE mg/dL
Nitrite: NEGATIVE
Protein, ur: 100 mg/dL — AB
RBC / HPF: >50 RBC/hpf — ABNORMAL HIGH (ref 0–5)
Specific Gravity, Urine: 1.018 (ref 1.005–1.030)
WBC, UA: >50 WBC/hpf — ABNORMAL HIGH (ref 0–5)
pH: 5 (ref 5.0–8.0)

## 2019-05-14 LAB — ABO/RH: ABO/RH(D): O POS

## 2019-05-14 LAB — COMPREHENSIVE METABOLIC PANEL
ALT: 14 U/L (ref 0–44)
AST: 22 U/L (ref 15–41)
Albumin: 3.5 g/dL (ref 3.5–5.0)
Alkaline Phosphatase: 46 U/L (ref 38–126)
Anion gap: 11 (ref 5–15)
BUN: 34 mg/dL — ABNORMAL HIGH (ref 8–23)
CO2: 29 mmol/L (ref 22–32)
Calcium: 9.1 mg/dL (ref 8.9–10.3)
Chloride: 101 mmol/L (ref 98–111)
Creatinine, Ser: 1.48 mg/dL — ABNORMAL HIGH (ref 0.61–1.24)
GFR calc Af Amer: 58 mL/min — ABNORMAL LOW (ref >60)
GFR calc non Af Amer: 50 mL/min — ABNORMAL LOW (ref >60)
Glucose, Bld: 174 mg/dL — ABNORMAL HIGH (ref 70–99)
Potassium: 3.6 mmol/L (ref 3.5–5.1)
Sodium: 141 mmol/L (ref 135–145)
Total Bilirubin: 0.6 mg/dL (ref 0.3–1.2)
Total Protein: 6.2 g/dL — ABNORMAL LOW (ref 6.5–8.1)

## 2019-05-14 LAB — GLUCOSE, CAPILLARY: Glucose-Capillary: 154 mg/dL — ABNORMAL HIGH (ref 70–99)

## 2019-05-14 LAB — LACTIC ACID, PLASMA
Lactic Acid, Venous: 3.8 mmol/L (ref 0.5–1.9)
Lactic Acid, Venous: 1.9 mmol/L (ref 0.5–1.9)

## 2019-05-14 LAB — PROTIME-INR
INR: 1.1 (ref 0.8–1.2)
Prothrombin Time: 14.4 seconds (ref 11.4–15.2)

## 2019-05-14 LAB — HEMOGLOBIN AND HEMATOCRIT, BLOOD
HCT: 29.9 % — ABNORMAL LOW (ref 39.0–52.0)
HCT: 24.4 % — ABNORMAL LOW (ref 39.0–52.0)
Hemoglobin: 10.3 g/dL — ABNORMAL LOW (ref 13.0–17.0)
Hemoglobin: 8.7 g/dL — ABNORMAL LOW (ref 13.0–17.0)

## 2019-05-14 LAB — MRSA PCR SCREENING: MRSA by PCR: NEGATIVE

## 2019-05-14 MED ORDER — SODIUM CHLORIDE 0.9 % IV SOLN
20.0000 ug/min | INTRAVENOUS | Status: DC
  Administered 2019-05-14: 160 ug/min via INTRAVENOUS
  Administered 2019-05-14: 20 ug/min via INTRAVENOUS
  Administered 2019-05-15: 02:00:00 170 ug/min via INTRAVENOUS
  Administered 2019-05-15: 05:00:00 50 ug/min via INTRAVENOUS
  Administered 2019-05-15: 170 ug/min via INTRAVENOUS
  Administered 2019-05-15: 155 ug/min via INTRAVENOUS
  Filled 2019-05-14: qty 10
  Filled 2019-05-14: qty 1
  Filled 2019-05-14: qty 10
  Filled 2019-05-14 (×2): qty 1
  Filled 2019-05-14: qty 10
  Filled 2019-05-14: qty 1
  Filled 2019-05-14: qty 10

## 2019-05-14 MED ORDER — SODIUM CHLORIDE 0.9 % IV BOLUS
1000.0000 mL | Freq: Once | INTRAVENOUS | Status: AC
  Administered 2019-05-14: 14:00:00 1000 mL via INTRAVENOUS

## 2019-05-14 MED ORDER — SODIUM CHLORIDE 0.9% IV SOLUTION: Freq: Once | INTRAVENOUS | Status: AC

## 2019-05-14 MED ORDER — SODIUM CHLORIDE 0.9 % IV SOLN: INTRAVENOUS | Status: AC

## 2019-05-14 MED ORDER — PHENYLEPHRINE HCL-NACL 10-0.9 MG/250ML-% IV SOLN
20.0000 ug/min | INTRAVENOUS | Status: DC
  Filled 2019-05-14: qty 250

## 2019-05-14 MED ORDER — SODIUM CHLORIDE 0.9 % IV SOLN
2.0000 g | Freq: Three times a day (TID) | INTRAVENOUS | Status: DC
  Administered 2019-05-14 – 2019-05-15 (×3): 2 g via INTRAVENOUS
  Filled 2019-05-14 (×6): qty 2

## 2019-05-14 MED ORDER — SODIUM CHLORIDE 0.9 % IV BOLUS: 1000.0000 mL | Freq: Once | INTRAVENOUS | Status: DC

## 2019-05-14 MED ORDER — VANCOMYCIN HCL 1750 MG/350ML IV SOLN: 1750.0000 mg | INTRAVENOUS | Status: DC

## 2019-05-14 MED ORDER — VANCOMYCIN HCL 10 G IV SOLR
2000.0000 mg | Freq: Once | INTRAVENOUS | Status: AC
  Administered 2019-05-14: 2000 mg via INTRAVENOUS
  Filled 2019-05-14: qty 2000

## 2019-05-14 MED ORDER — SODIUM CHLORIDE 0.9 % IV BOLUS
500.0000 mL | Freq: Once | INTRAVENOUS | Status: AC
  Administered 2019-05-14: 500 mL via INTRAVENOUS

## 2019-05-14 MED ORDER — SODIUM CHLORIDE 0.9 % IV BOLUS
1000.0000 mL | Freq: Once | INTRAVENOUS | Status: AC
  Administered 2019-05-14: 1000 mL via INTRAVENOUS

## 2019-05-14 MED ORDER — SODIUM CHLORIDE 0.9 % IV BOLUS
500.0000 mL | Freq: Once | INTRAVENOUS | Status: AC
  Administered 2019-05-14: 23:00:00 500 mL via INTRAVENOUS

## 2019-05-14 NOTE — Progress Notes (Signed)
OT Cancellation Note  Patient Details Name: Jahziah Siclari MRN: [id number] DOB: [birth year]   Cancelled Treatment:    Reason Eval/Treat Not Completed: Medical issues which prohibited therapy. Pt noted with rapid response earlier this date. Will hold OT tx and continue to follow acutely to re-attempt when appropriate.   Jeni Salles, MPH, MS, OTR/L ascom [phone] 05/14/19, 4:03 PM

## 2019-05-14 NOTE — Progress Notes (Signed)
MD Dhungel secure chat RN Delana Meyer this morning and told me to check positioning of urethral catheter. Stated ultrasound showed that the catheter was not in bladder. When Candace, LPN went in to reposition and deflated balloon, she drew back 10 mL and blood immediately started squirting from patient's penis. She called me to come in room and when I went in, blood was still shooting from patient's penis. Charge nurse Andi Hence and [employer] were also present. MD was messaged to come to patient's room and when he arrived he saw the blood for himself. He ordered STAT CBC and STAT type and screen and urinalysis. Patient is still actively bleeding. Per urology a caude catheter needs to be placed.

## 2019-05-14 NOTE — Progress Notes (Signed)
   05/14/19 1300  Clinical Encounter Type  Visited With Patient;Health care provider  Visit Type Initial;Social support  Referral From Nurse  Consult/Referral To Chaplain  Spiritual Encounters  Spiritual Needs Emotional   [place] visited with PT as a part of a RR. Healthcare providers were working on PT upon arrival and PT was lying in bed. CH offered silent prayer and stood by for support. [place] asked for a page if something comes up.   End of Visit  No Further

## 2019-05-14 NOTE — Progress Notes (Signed)
Patient became hypotensive with systolic blood pressure in the 80s to, tachycardic to 120s.  Fever of 103 F.  Denies any pain or shortness of breath.  Rapid response called.  Blood work ordered for repeat H&H, blood cultures, urine culture, lactic acid and chest x-ray.  Ordered IV fluids 500 cc bolus followed by 1 L bolus.  Placed on cardiac monitor. EKG showing sinus tachycardia in the 120s without ST-T changes. Coud catheter placed by urology earlier this afternoon with recommendation for as needed irrigation and to continue Foley until follow-up with urology in [place] after discharge.  UA does show large WBC and bacteria raising concern for UTI with sepsis.  Ordered for empiric IV vancomycin and cefepime after blood and urine culture sent.  Patient allergy to Tylenol and cannot give NSAID currently given gross hematuria.  Continue telemetry monitoring.  If no improvement in blood pressure despite fluid boluses will transfer patient to stepdown unit.

## 2019-05-14 NOTE — Progress Notes (Deleted)
Report given to Joy, RN receiving RN, patient transferring to room 20 stepdown unit.

## 2019-05-14 NOTE — Consult Note (Signed)
Subjective: 1. Schizophrenia, unspecified type ([place])   2. Suicidal thoughts   3. Confusion and disorientation   4. Facial droop   5. Dysphagia, unspecified type   6. TIA (transient ischemic attack)   7. Fever   8. Urinary retention      I was asked to see Peter Parsons for foley catheter placement by Dr. Clementeen Graham.  He is in retention and foley placement this morning was associated with pain and no balloon in the bladder on [place].   The foley was removed and he had prompt bleeding consistent with foley trauma most consistent with balloon inflation in the urethra or prostate.  Peter Parsons is not communicative.  ROS:  Review of Systems  Unable to perform ROS: Patient nonverbal    Allergies  Allergen Reactions  . Acetaminophen Other (See Comments)    Unknown Unable to breathe Chest  tightness/SOB     Past Medical History:  Diagnosis Date  . Diabetes mellitus without complication ([place])   . Hypertension   . Schizophrenia ([place])     History reviewed. No pertinent surgical history.  Social History   Socioeconomic History  . Marital status: Single    Spouse name: Not on file  . Number of children: Not on file  . Years of education: Not on file  . Highest education level: Not on file  Occupational History  . Not on file  Tobacco Use  . Smoking status: Former Research scientist (life sciences)  . Smokeless tobacco: Never Used  Substance and Sexual Activity  . Alcohol use: Not Currently  . Drug use: Not Currently  . Sexual activity: Not on file  Other Topics Concern  . Not on file  Social History Narrative  . Not on file   Social Determinants of Health   Financial Resource Strain:   . Difficulty of Paying Living Expenses:   Food Insecurity:   . Worried About Charity fundraiser in the Last Year:   . Arboriculturist in the Last Year:   Transportation Needs:   . Film/video editor (Medical):   Marland Kitchen Lack of Transportation (Non-Medical):   Physical Activity:   . Days of Exercise per Week:   .  Minutes of Exercise per Session:   Stress:   . Feeling of Stress :   Social Connections:   . Frequency of Communication with Friends and Family:   . Frequency of Social Gatherings with Friends and Family:   . Attends Religious Services:   . Active Member of Clubs or Organizations:   . Attends Archivist Meetings:   Marland Kitchen Marital Status:   Intimate Partner Violence:   . Fear of Current or Ex-Partner:   . Emotionally Abused:   Marland Kitchen Physically Abused:   . Sexually Abused:     History reviewed. No pertinent family history.  Anti-infectives: Anti-infectives (From admission, onward)   None      Current Facility-Administered Medications  Medication Dose Route Frequency Provider Last Rate Last Admin  . atorvastatin (LIPITOR) tablet 10 mg  10 mg Oral Daily Carrie Mew, MD   10 mg at 05/14/19 0956  . chlorhexidine (PERIDEX) 0.12 % solution 15 mL  15 mL Mouth/Throat BID Lavina Hamman, MD   15 mL at 05/14/19 0956  . Chlorhexidine Gluconate Cloth 2 % PADS 6 each  6 each Topical Daily Dhungel, Nishant, MD   6 each at 05/13/19 0924  . clonazePAM (KLONOPIN) tablet 0.25 mg  0.25 mg Oral TID PRN Cristofano, Dorene Ar, MD  0.25 mg at 05/01/19 1603  . dextrose 50 % solution 50 mL  1 ampule Intravenous PRN Lavina Hamman, MD      . enoxaparin (LOVENOX) injection 40 mg  40 mg Subcutaneous Q24H Lavina Hamman, MD   40 mg at 05/14/19 0453  . escitalopram (LEXAPRO) tablet 10 mg  10 mg Oral Daily Cristofano, Dorene Ar, MD   10 mg at 05/14/19 0956  . feeding supplement (NEPRO CARB STEADY) liquid 237 mL  237 mL Oral TID BM Enzo Bi, MD 0 mL/hr at 05/14/19 0555 237 mL at 05/14/19 0955  . feeding supplement (PRO-STAT SUGAR FREE 64) liquid 30 mL  30 mL Oral BID Dhungel, Nishant, MD   30 mL at 05/14/19 0956  . gabapentin (NEURONTIN) capsule 300 mg  300 mg Oral TID Carrie Mew, MD   300 mg at 05/14/19 0956  . metoprolol tartrate (LOPRESSOR) injection 5 mg  5 mg Intravenous Q12H PRN Wyvonnia Dusky, MD   5 mg at 05/14/19 1011  . mirtazapine (REMERON) tablet 45 mg  45 mg Oral QHS Clapacs, Madie Reno, MD   45 mg at 05/13/19 2125  . multivitamin with minerals tablet 1 tablet  1 tablet Oral Daily Cristofano, Dorene Ar, MD   1 tablet at 05/14/19 0956  . risperiDONE (RISPERDAL) tablet 3 mg  3 mg Oral BID Carrie Mew, MD   3 mg at 05/14/19 0956  . senna-docusate (Senokot-S) tablet 1 tablet  1 tablet Oral QHS PRN Mansy, Arvella Merles, MD   1 tablet at 04/15/19 2229  . senna-docusate (Senokot-S) tablet 1 tablet  1 tablet Oral Daily Enzo Bi, MD   1 tablet at 05/14/19 [phone]  . tamsulosin (FLOMAX) capsule 0.4 mg  0.4 mg Oral Daily Fritzi Mandes, MD   0.4 mg at 05/14/19 0956  . traZODone (DESYREL) tablet 150 mg  150 mg Oral QHS Carrie Mew, MD   150 mg at 05/13/19 2125     Objective: Vital signs in last 24 hours: BP 108/78 (BP Location: Left Arm)   Pulse (!) 151   Temp 97.9 F (36.6 C) (Oral)   Resp 18   Ht 5\' 9"  (1.753 m)   Wt 74 kg   SpO2 95%   BMI 24.09 kg/m   Intake/Output from previous day: 04/01 0701 - 04/02 0700 In: 240 [P.O.:240] Out: 200 [Urine:200] Intake/Output this shift: No intake/output data recorded.   Physical Exam Vitals reviewed.  Constitutional:      Appearance: Normal appearance.  Abdominal:     General: Abdomen is flat.     Tenderness: There is abdominal tenderness (in suprapubic area. ).  Genitourinary:    Comments: Normal phallus with adequate meatus. Scrotum and contents normal.  Rectal deferred.  Neurological:     Comments: Non-communicative.      Lab Results:  Results for orders placed or performed during the hospital encounter of 02/11/19 (from the past 24 hour(s))  CBC     Status: Abnormal   Collection Time: 05/14/19  8:17 AM  Result Value Ref Range   WBC 8.8 4.0 - 10.5 K/uL   RBC 3.91 (L) 4.22 - 5.81 MIL/uL   Hemoglobin 11.9 (L) 13.0 - 17.0 g/dL   HCT 34.8 (L) 39.0 - 52.0 %   MCV 89.0 80.0 - 100.0 fL   MCH 30.4 26.0 - 34.0 pg    MCHC 34.2 30.0 - 36.0 g/dL   RDW 13.7 11.5 - 15.5 %   Platelets 146 (L) 150 - 400  K/uL   nRBC 0.0 0.0 - 0.2 %  Protime-INR     Status: None   Collection Time: 05/14/19  8:17 AM  Result Value Ref Range   Prothrombin Time 14.4 11.4 - 15.2 seconds   INR 1.1 0.8 - 1.2  Type and screen [place]     Status: None (Preliminary result)   Collection Time: 05/14/19  8:18 AM  Result Value Ref Range   ABO/RH(D) PENDING    Antibody Screen PENDING    Sample Expiration      05/17/2019,2359 Performed at [facility], [address] [place], [place] [address]   Urinalysis, Complete w Microscopic     Status: Abnormal   Collection Time: 05/14/19  8:24 AM  Result Value Ref Range   Color, Urine RED (A) YELLOW   APPearance CLOUDY (A) CLEAR   Specific Gravity, Urine 1.018 1.005 - 1.030   pH 5.0 5.0 - 8.0   Glucose, UA NEGATIVE NEGATIVE mg/dL   Hgb urine dipstick LARGE (A) NEGATIVE   Bilirubin Urine NEGATIVE NEGATIVE   Ketones, ur NEGATIVE NEGATIVE mg/dL   Protein, ur 100 (A) NEGATIVE mg/dL   Nitrite NEGATIVE NEGATIVE   Leukocytes,Ua SMALL (A) NEGATIVE   RBC / HPF >50 (H) 0 - 5 RBC/hpf   WBC, UA >50 (H) 0 - 5 WBC/hpf   Bacteria, UA MANY (A) NONE SEEN   Squamous Epithelial / LPF 0-5 0 - 5    BMET No results for input(s): NA, K, CL, CO2, GLUCOSE, BUN, CREATININE, CALCIUM in the last 72 hours. PT/INR Recent Labs    05/14/19 0817  LABPROT 14.4  INR 1.1   ABG No results for input(s): PHART, HCO3 in the last 72 hours.  Invalid input(s): PCO2, PO2  Studies/Results: US RENAL  Result Date: 05/13/2019 CLINICAL DATA:  Urinary retention EXAM: RENAL / URINARY TRACT ULTRASOUND COMPLETE COMPARISON:  CT 03/20/2019 FINDINGS: Right Kidney: Renal measurements: 12.1 x 5.3 x 4.9 cm = volume: 164 mL. Cortical echogenicity is normal. Prominent right extrarenal pelvis without calyceal dilatation. Cyst lower pole measuring 2.5 cm. Left Kidney: Renal measurements: 11.8 x 4.9 x  5.3 cm = volume: 159 mL. Cortical echogenicity is normal. No hydronephrosis. No mass. Bladder: Distended urinary bladder. Other: Spleen slightly enlarged. IMPRESSION: 1. Slight enlargement of right extrarenal pelvis but without definitive hydronephrosis. Cyst in the right kidney 2. Kidneys are otherwise unremarkable 3. Distended urinary bladder. Notation states that patient has Foley catheter but no catheter visible by ultrasound. Correlate with physical exam 4. Enlarged spleen Electronically Signed   By: Donavan Foil M.D.   On: 05/13/2019 17:38   Procedure:  Foley catheter insertion.  He was prepped with betadine and 9ml of [place] was instilled per urethra.  A 81fr coude foley was passed without resistance.   The balloon was filled with 86ml of sterile water and the catheter was placed to straight drainage.  There was prompt drainage of moderately bloody urine without clots.   Assessment/Plan: Urinary retention with foley trauma.   I will order prn foley irrigation.   He will need to keep the foley and f/u with [facility] after discharge.           No follow-ups on file.    CC: Nishant Dhungel MD     Irine Seal 05/14/2019 [phone]

## 2019-05-14 NOTE — Progress Notes (Signed)
Rapid response called because patient mental status change and heart rate still high and blood pressure dropped. MD came and orders were put in. Once boluses are finish, if blood pressure still dropping MD states we will transfer patient to step down. All vitals were put into flowsheet and orders that were put in by MD can be viewed in orders.

## 2019-05-14 NOTE — TOC Progression Note (Signed)
Transition of Care [facility]) - Progression Note    Patient Details  Name: Tykeem Zaya MRN: [id number] Date of Birth: [birth year]  [facility]) CM/SW Contact  Anselm Pancoast, RN Phone Number: 05/14/2019, 1:58 PM  Clinical Narrative:    Left voicemail for [facility] @ [phone] regarding possible group home placement in one of his facilities.    Expected Discharge Plan: Group Home Barriers to Discharge: SNF Pending bed offer  Expected Discharge Plan and Services Expected Discharge Plan: Group Home   Discharge Planning Services: CM Consult   Living arrangements for the past 2 months: Group Home                 DME Arranged: N/A DME Agency: [employer] Date DME Agency Contacted: 04/12/19 Time DME Agency Contacted: [phone] Representative spoke with at DME Agency: none needed [place] Arranged: NA           Social Determinants of Health ([place]) Interventions    Readmission Risk Interventions Readmission Risk Prevention Plan 04/27/2019 04/20/2019  Transportation Screening Complete Complete  PCP or Specialist Appt within 3-5 Days - Not Complete  Not Complete comments - not ready to [place] or Home Care Consult - Not Complete  HRI or Home Care Consult comments - not ready toDC, looking for a bed for placement  Social Work Consult for [place] Planning/Counseling - [place] - Not Applicable  Medication Review (RN Care Manager) Referral to Pharmacy Referral to Pharmacy  PCP or Specialist appointment within 3-5 days of discharge Complete -  [place] or [place] Complete -  [place] Not Applicable -

## 2019-05-14 NOTE — Progress Notes (Signed)
PROGRESS NOTE                                                                                                                                                                                                             Patient Demographics:    Peter Parsons, is a 59 y.o. male, DOB - [birth year], [id number]  Admit date - 02/11/2019   Admitting Physician Lavina Hamman, MD  Outpatient Primary MD for the patient is System, Provider Not In  LOS - 22   Chief Complaint  Patient presents with  . ivc       Brief Narrative 59 year old male with chronic schizophrenia with frequent suicidal ideations, bipolar disorder with depression, type 2 diabetes mellitus (controlled), CKD stage II, peripheral neuropathy, BPH, hypertension presented with altered mental status.  Prolonged hospital stay awaiting placement in a memory care unit.  Does not have capacity.   Subjective:   Foley placed in by nurse yesterday afternoon due to persistent urinary retention.  Per her she recovered 600 cc after was placed.  Renal ultrasound done thereafter commented on bladder distention and Foley not in the bladder. I had the nurse check in this morning and was informed patient having gross hematuria. Foley removed and patient still having active hematuria with clots.  Patient reports some suprapubic discomfort.  Tachycardic in the 120s-130s.  Blood pressure stable.  Assessment  & Plan :   Principal problem Gross hematuria Suspect traumatic catheterization.  Foley was not seen in the bladder on renal ultrasound.  Foley removed and patient still having gross hematuria with clots.  I spoke with urology Dr. Idamae Schuller who recommends patient likely had catheter in the prostatic urethra and given his BPH he should have a coud catheter placed in.  Recommended to call him back if patient still having bleeding after the coud catheter placed in. Renal ultrasound  unremarkable except for bladder distention. Check stat CBC, type and screen, PT/INR. Place patient on telemetry.  Active problems  Chronic schizophrenia Continue paliperidone, Klonopin, mirtazapine and risperidone.  History of frequent suicidal ideation.  Does not have capacity.  Outpatient psychiatry follow-up.  Depression with bipolar disorder Continue Lexapro and trazodone.  Chronic kidney disease stage II Stable.   Type 2 diabetes mellitus, controlled A1c of 4.5.  Continue Metformin  Thrombocytopenia Unclear etiology.  Platelet stable.  Discontinued ibuprofen.  Peripheral neuropathy Continue gabapentin  BPH With urinary retention.  Continue Flomax. Hematuria as above.  Plan to place a coud catheter.  Hypertension/ hyperlipidemia Off all blood pressure meds as patient has remained normotensive.  Continue statin.  Dysphagia/dysarthria and left facial droop MRI negative for CVA.  Continue dysphagia diet  Severe protein calorie malnutrition Continue nutrition supplement and Remeron  Failure to thrive PT recommends home health with 24-hour supervision.  Awaiting placement.  Code Status : Full code  Family Communication  : None  Disposition Plan  : Was not accepted to group home.  Referral made to SNF.  Barriers For Discharge : Awaiting placement.  Now with active hematuria.  Consults  : Psychiatry, urology  Procedures  :   DVT Prophylaxis  : SCDs  Lab Results  Component Value Date   PLT 159 05/10/2019    Antibiotics  :   Anti-infectives (From admission, onward)   None        Objective:   Vitals:   05/14/19 0402 05/14/19 0734 05/14/19 0820 05/14/19 0822  BP: 103/67 126/83 (!) 134/91 119/86  Pulse: 60 (!) 119 (!) 125 (!) 124  Resp: 16 18    Temp: 98.4 F (36.9 C) 99 F (37.2 C)    TempSrc: Oral Oral    SpO2: 96% 98% 99% 98%  Weight:      Height:        Wt Readings from Last 3 Encounters:  05/04/19 74 kg  01/25/19 90.3 kg  12/14/18  120.2 kg     Intake/Output Summary (Last 24 hours) at 05/14/2019 [id number] Last data filed at 05/13/2019 1600 Gross per 24 hour  Intake 240 ml  Output 200 ml  Net 40 ml   Physical exam Some distress HEENT: Moist mucosa Chest: Clear CVs: S1-S2 tachycardic, no murmurs GI: Soft, nondistended, nontender, gross renal hematuria Musculoskeletal: Warm, no edema     Data Review:    CBC Recent Labs  Lab 05/10/19 1719  WBC 6.0  HGB 11.9*  HCT 35.1*  PLT 159  MCV 89.3  MCH 30.3  MCHC 33.9  RDW 13.4    Chemistries  Recent Labs  Lab 05/10/19 1719  NA 142  K 3.8  CL 100  CO2 31  GLUCOSE 123*  BUN 40*  CREATININE 1.04  CALCIUM 9.4   ------------------------------------------------------------------------------------------------------------------ No results for input(s): CHOL, HDL, LDLCALC, TRIG, CHOLHDL, LDLDIRECT in the last 72 hours.  Lab Results  Component Value Date   HGBA1C 4.5 (L) 03/28/2019   ------------------------------------------------------------------------------------------------------------------ No results for input(s): TSH, T4TOTAL, T3FREE, THYROIDAB in the last 72 hours.  Invalid input(s): FREET3 ------------------------------------------------------------------------------------------------------------------ No results for input(s): VITAMINB12, FOLATE, FERRITIN, TIBC, IRON, RETICCTPCT in the last 72 hours.  Coagulation profile No results for input(s): INR, PROTIME in the last 168 hours.  No results for input(s): DDIMER in the last 72 hours.  Cardiac Enzymes No results for input(s): CKMB, TROPONINI, MYOGLOBIN in the last 168 hours.  Invalid input(s): CK ------------------------------------------------------------------------------------------------------------------ No results found for: BNP  Inpatient Medications  Scheduled Meds: . atorvastatin  10 mg Oral Daily  . chlorhexidine  15 mL Mouth/Throat BID  . Chlorhexidine Gluconate Cloth  6  each Topical Daily  . enoxaparin (LOVENOX) injection  40 mg Subcutaneous Q24H  . escitalopram  10 mg Oral Daily  . feeding supplement (NEPRO CARB STEADY)  237 mL Oral TID BM  . feeding supplement (PRO-STAT SUGAR FREE 64)  30 mL Oral BID  . gabapentin  300 mg Oral TID  .  mirtazapine  45 mg Oral QHS  . multivitamin with minerals  1 tablet Oral Daily  . risperiDONE  3 mg Oral BID  . senna-docusate  1 tablet Oral Daily  . tamsulosin  0.4 mg Oral Daily  . traZODone  150 mg Oral QHS   Continuous Infusions:  PRN Meds:.clonazePAM, dextrose, metoprolol tartrate, senna-docusate  Micro Results No results found for this or any previous visit (from the past 240 hour(s)).  Radiology Reports DG Chest 2 View  Result Date: 04/28/2019 CLINICAL DATA:  Fever.  Unresponsive. EXAM: CHEST - 2 VIEW COMPARISON:  02/12/2019 FINDINGS: The heart size and mediastinal contours are within normal limits. Both lungs are clear. The visualized skeletal structures are unremarkable. IMPRESSION: No active cardiopulmonary disease. Electronically Signed   By: Kerby Moors M.D.   On: 04/28/2019 10:00   US RENAL  Result Date: 05/13/2019 CLINICAL DATA:  Urinary retention EXAM: RENAL / URINARY TRACT ULTRASOUND COMPLETE COMPARISON:  CT 03/20/2019 FINDINGS: Right Kidney: Renal measurements: 12.1 x 5.3 x 4.9 cm = volume: 164 mL. Cortical echogenicity is normal. Prominent right extrarenal pelvis without calyceal dilatation. Cyst lower pole measuring 2.5 cm. Left Kidney: Renal measurements: 11.8 x 4.9 x 5.3 cm = volume: 159 mL. Cortical echogenicity is normal. No hydronephrosis. No mass. Bladder: Distended urinary bladder. Other: Spleen slightly enlarged. IMPRESSION: 1. Slight enlargement of right extrarenal pelvis but without definitive hydronephrosis. Cyst in the right kidney 2. Kidneys are otherwise unremarkable 3. Distended urinary bladder. Notation states that patient has Foley catheter but no catheter visible by ultrasound.  Correlate with physical exam 4. Enlarged spleen Electronically Signed   By: Donavan Foil M.D.   On: 05/13/2019 17:38    Time Spent in minutes 35   Avianah Pellman M.D on 05/14/2019 at 8:32 AM  Between 7am to 7pm - Pager - [phone]  After 7pm go to [email or web address] - password [facility]  [facility] -  Office  [phone]

## 2019-05-14 NOTE — Progress Notes (Addendum)
eLink Physician-Brief Progress Note Patient Name: Peter Parsons DOB: Jul 03, 1960 MRN: [id number]   Date of Service  05/14/2019  HPI/Events of Note  Called by NP to discuss on case. Patient developed hematuria after foley placement and is now hypotensive with increasing pressor requirement. H/H went down to 8.7/24 from 10.3/30. Lactic acid normal somewhat reassuring. Already on vancomycin and cefepime.  eICU Interventions  Agree with transfusing 1 unit PRBC Agree with CT scan of abdomen and pelvis Check BP of both sides     Intervention Category Intermediate Interventions: Bleeding - evaluation and treatment with blood products  Judd Lien 05/14/2019, 11:29 PM

## 2019-05-14 NOTE — Progress Notes (Signed)
Writer gave report to Gregary Signs, RN, patient is transferring to stepdown unit per MD orders.

## 2019-05-14 NOTE — Progress Notes (Signed)
Pt arrived to this unit after receiving report from [place] on 1A. He is hypotensive with systolic pressures in the high 70's to low 80's set up for q 15 min vital signs. Received verbal order from Dr Patsey Berthold to give 1 liter NS bolus x 1 now. Pt is responsive drowsy but responsive to verbal stimuli. Foley is draining cranberry colored urine. No pills given at this time as patient is too drowsy to take anything by mouth. Will continue to monitor closely and report any and all changes to critical care physician. Call bell with in reach.

## 2019-05-14 NOTE — Consult Note (Signed)
Pharmacy Antibiotic Note  Peter Parsons is a 59 y.o. male admitted on 02/11/2019 with sepsis.  Pharmacy has been consulted for Cefepime/Vancomycin dosing. Prolonged hospital stay awaiting placement in a memory care unit. Foley placed in by nurse yesterday afternoon due to persistent urinary retention. Foley removed and patient still having active hematuria with clots.  Patient reports some suprapubic discomfort.  Tachycardic in the 120s-130s. Urinalysis negative (nitrite); small leukocytes; many bacteria; pyuria. WBC 8.8. Fever 103 F.  Plan: 1. Vancomycin:   loading 2000 mg x1 dose  Maintenance 1750 mg Q24 hours  AUC Goal 400-550  Expected AUC 495.3  2. Cefepime 2g Q8H  Height: 5\' 9"  (175.3 cm) Weight: 74 kg (163 lb 2.3 oz) IBW/kg (Calculated) : 70.7  Temp (24hrs), Avg:99.3 F (37.4 C), Min:97.9 F (36.6 C), Max:103 F (39.4 C)  Recent Labs  Lab 05/10/19 1719 05/14/19 0817  WBC 6.0 8.8  CREATININE 1.04  --     Estimated Creatinine Clearance: 74.6 mL/min (by C-G formula based on SCr of 1.04 mg/dL).    Allergies  Allergen Reactions  . Acetaminophen Other (See Comments)    Unknown Unable to breathe Chest  tightness/SOB     Antimicrobials this admission: 4/2 Cefepime/Vancomcyin    Microbiology results:  BCx: pending  UCx: pending  Thank you for allowing pharmacy to be a part of this patient's care.  Rowland Lathe 05/14/2019 1:29 PM

## 2019-05-14 NOTE — Plan of Care (Signed)
Patient is too lethargic and ill to  participate in education at this time. Will attempt when he is more arousable and able to converse

## 2019-05-15 DIAGNOSIS — I10 Essential (primary) hypertension: Secondary | ICD-10-CM | POA: Diagnosis present

## 2019-05-15 DIAGNOSIS — D696 Thrombocytopenia, unspecified: Secondary | ICD-10-CM | POA: Diagnosis present

## 2019-05-15 DIAGNOSIS — R6521 Severe sepsis with septic shock: Secondary | ICD-10-CM

## 2019-05-15 DIAGNOSIS — N320 Bladder-neck obstruction: Secondary | ICD-10-CM | POA: Diagnosis not present

## 2019-05-15 DIAGNOSIS — N390 Urinary tract infection, site not specified: Secondary | ICD-10-CM | POA: Diagnosis not present

## 2019-05-15 DIAGNOSIS — R338 Other retention of urine: Secondary | ICD-10-CM | POA: Diagnosis not present

## 2019-05-15 DIAGNOSIS — N400 Enlarged prostate without lower urinary tract symptoms: Secondary | ICD-10-CM | POA: Diagnosis present

## 2019-05-15 DIAGNOSIS — R7881 Bacteremia: Secondary | ICD-10-CM | POA: Diagnosis not present

## 2019-05-15 DIAGNOSIS — E785 Hyperlipidemia, unspecified: Secondary | ICD-10-CM | POA: Diagnosis present

## 2019-05-15 DIAGNOSIS — A419 Sepsis, unspecified organism: Secondary | ICD-10-CM | POA: Diagnosis not present

## 2019-05-15 DIAGNOSIS — B952 Enterococcus as the cause of diseases classified elsewhere: Secondary | ICD-10-CM | POA: Diagnosis not present

## 2019-05-15 DIAGNOSIS — N170 Acute kidney failure with tubular necrosis: Secondary | ICD-10-CM

## 2019-05-15 DIAGNOSIS — E119 Type 2 diabetes mellitus without complications: Secondary | ICD-10-CM

## 2019-05-15 DIAGNOSIS — D649 Anemia, unspecified: Secondary | ICD-10-CM | POA: Diagnosis present

## 2019-05-15 DIAGNOSIS — S3730XA Unspecified injury of urethra, initial encounter: Secondary | ICD-10-CM | POA: Diagnosis not present

## 2019-05-15 LAB — CBC
HCT: 25.5 % — ABNORMAL LOW (ref 39.0–52.0)
Hemoglobin: 8.7 g/dL — ABNORMAL LOW (ref 13.0–17.0)
MCH: 30.4 pg (ref 26.0–34.0)
MCHC: 34.1 g/dL (ref 30.0–36.0)
MCV: 89.2 fL (ref 80.0–100.0)
Platelets: 102 10*3/uL — ABNORMAL LOW (ref 150–400)
RBC: 2.86 MIL/uL — ABNORMAL LOW (ref 4.22–5.81)
RDW: 14.1 % (ref 11.5–15.5)
WBC: 10.6 10*3/uL — ABNORMAL HIGH (ref 4.0–10.5)
nRBC: 0 % (ref 0.0–0.2)

## 2019-05-15 LAB — BLOOD CULTURE ID PANEL (REFLEXED)

## 2019-05-15 LAB — BLOOD GAS, ARTERIAL
Acid-Base Excess: 1.9 mmol/L (ref 0.0–2.0)
Bicarbonate: 26.6 mmol/L (ref 20.0–28.0)
FIO2: 0.21
O2 Saturation: 96.7 %
Patient temperature: 37
pCO2 arterial: 41 mmHg (ref 32.0–48.0)
pH, Arterial: 7.42 (ref 7.350–7.450)
pO2, Arterial: 86 mmHg (ref 83.0–108.0)

## 2019-05-15 LAB — HEMOGLOBIN AND HEMATOCRIT, BLOOD
HCT: 28.2 % — ABNORMAL LOW (ref 39.0–52.0)
Hemoglobin: 9.7 g/dL — ABNORMAL LOW (ref 13.0–17.0)

## 2019-05-15 LAB — FERRITIN: Ferritin: 161 ng/mL (ref 24–336)

## 2019-05-15 LAB — CREATININE, SERUM
Creatinine, Ser: 1.54 mg/dL — ABNORMAL HIGH (ref 0.61–1.24)
GFR calc Af Amer: 56 mL/min — ABNORMAL LOW (ref >60)
GFR calc non Af Amer: 48 mL/min — ABNORMAL LOW (ref >60)

## 2019-05-15 LAB — IRON AND TIBC
Iron: 11 ug/dL — ABNORMAL LOW (ref 45–182)
Saturation Ratios: 7 % — ABNORMAL LOW (ref 17.9–39.5)
TIBC: 168 ug/dL — ABNORMAL LOW (ref 250–450)
UIBC: 157 ug/dL

## 2019-05-15 LAB — VITAMIN B12: Vitamin B-12: 276 pg/mL (ref 180–914)

## 2019-05-15 MED ORDER — SODIUM CHLORIDE 0.9% FLUSH
10.0000 mL | Freq: Two times a day (BID) | INTRAVENOUS | Status: DC
  Administered 2019-05-15 – 2019-06-23 (×64): 10 mL

## 2019-05-15 MED ORDER — VANCOMYCIN HCL 1250 MG/250ML IV SOLN
1250.0000 mg | INTRAVENOUS | Status: DC
  Administered 2019-05-15: 1250 mg via INTRAVENOUS
  Filled 2019-05-15: qty 250

## 2019-05-15 MED ORDER — SODIUM CHLORIDE 0.9% FLUSH: 10.0000 mL | INTRAVENOUS | Status: DC | PRN

## 2019-05-15 MED ORDER — SODIUM CHLORIDE 0.9 % IV SOLN
2.0000 g | INTRAVENOUS | Status: AC
  Administered 2019-05-16 – 2019-05-24 (×50): 2 g via INTRAVENOUS
  Filled 2019-05-15 (×5): qty 2
  Filled 2019-05-15: qty 2000
  Filled 2019-05-15 (×2): qty 2
  Filled 2019-05-15 (×2): qty 2000
  Filled 2019-05-15 (×6): qty 2
  Filled 2019-05-15: qty 2000
  Filled 2019-05-15: qty 2
  Filled 2019-05-15 (×3): qty 2000
  Filled 2019-05-15 (×3): qty 2
  Filled 2019-05-15: qty 2000
  Filled 2019-05-15 (×11): qty 2
  Filled 2019-05-15: qty 2000
  Filled 2019-05-15 (×4): qty 2
  Filled 2019-05-15: qty 2000
  Filled 2019-05-15: qty 2
  Filled 2019-05-15: qty 2000
  Filled 2019-05-15 (×5): qty 2
  Filled 2019-05-15 (×2): qty 2000
  Filled 2019-05-15: qty 2
  Filled 2019-05-15: qty 2000
  Filled 2019-05-15 (×5): qty 2

## 2019-05-15 MED ORDER — SODIUM CHLORIDE 0.9 % IV SOLN
2.0000 g | Freq: Three times a day (TID) | INTRAVENOUS | Status: DC
  Filled 2019-05-15: qty 2000

## 2019-05-15 MED ORDER — SODIUM CHLORIDE 0.9 % IV SOLN
2.0000 g | Freq: Two times a day (BID) | INTRAVENOUS | Status: DC
  Filled 2019-05-15: qty 2

## 2019-05-15 NOTE — Progress Notes (Signed)
PHARMACY - PHYSICIAN COMMUNICATION CRITICAL VALUE ALERT - BLOOD CULTURE IDENTIFICATION (BCID)  Peter Parsons is an 59 y.o. male who presented to [facility] on 02/11/2019 with a chief complaint of sepsis.  Assessment:  Lab reports 1 of 4 bottles w/ GPC  Name of physician (or Provider) Contacted: Jonny Ruiz, NP  Current antibiotics: Cefepime and Vancomycin  Changes to prescribed antibiotics recommended:  Patient is on recommended antibiotics - No changes needed  No results found for this or any previous visit.  Hart Robinsons A 05/15/2019  2:25 AM

## 2019-05-15 NOTE — Progress Notes (Signed)
Patient ID: Peter Parsons, male   DOB: 01/03/1961, 59 y.o.   MRN: [id number]         [facility] for Infectious Disease    Date of Admission:  02/11/2019           Day 2 vancomycin        Day 2 cefepime       Reason for Consult: Automatic consultation for enterococcal bacteremia     Assessment: He has enterococcal bacteremia complicating acute urinary retention, Foley urethral trauma and a UTI. I will narrow antibiotic therapy to IV ampicillin. I will order repeat blood cultures. With such an acute event it is very unlikely that he has already developed endocarditis. I do not recommend pursuing echocardiography unless there is clinical evidence of valvular dysfunction or he has persistently positive blood cultures.  Plan: 1. Change vancomycin and cefepime to IV ampicillin 2. Repeat blood cultures  Principal Problem:   Enterococcal bacteremia Active Problems:   BPH (benign prostatic hyperplasia)   Acute urinary retention   Urethral trauma   UTI (urinary tract infection)   Paranoid schizophrenia (HCC)   Altered mental status   Dementia (HCC)   Weakness   Acute metabolic encephalopathy   Protein-calorie malnutrition, severe   Pressure injury of skin   Hypertension   Type 2 diabetes mellitus (HCC)   Dyslipidemia   Normocytic anemia   Thrombocytopenia (HCC)   Scheduled Meds: . atorvastatin  10 mg Oral Daily  . chlorhexidine  15 mL Mouth/Throat BID  . Chlorhexidine Gluconate Cloth  6 each Topical Daily  . escitalopram  10 mg Oral Daily  . feeding supplement (NEPRO CARB STEADY)  237 mL Oral TID BM  . feeding supplement (PRO-STAT SUGAR FREE 64)  30 mL Oral BID  . gabapentin  300 mg Oral TID  . mirtazapine  45 mg Oral QHS  . multivitamin with minerals  1 tablet Oral Daily  . risperiDONE  3 mg Oral BID  . senna-docusate  1 tablet Oral Daily  . sodium chloride flush  10-40 mL Intracatheter Q12H  . tamsulosin  0.4 mg Oral Daily  . traZODone  150 mg Oral QHS    Continuous Infusions: . sodium chloride 125 mL/hr at 05/15/19 0607  . ceFEPime (MAXIPIME) IV    . phenylephrine (NEO-SYNEPHRINE) Adult infusion 40 mcg/min (05/15/19 0549)  . sodium chloride    . vancomycin 1,250 mg (05/15/19 0957)   PRN Meds:.clonazePAM, dextrose, metoprolol tartrate, senna-docusate, sodium chloride flush  HPI: Peter Parsons is a 59 y.o. male history of schizophrenia dementia who was involuntarily committed on 02/11/2019 wandering away from his facility. He has been awaiting placement in a memory care unit. He has a history of BPH and developed acute urinary retention. A Foley catheter was placed on 05/13/2019. He developed hematuria, pyuria, fever and hypotension yesterday. Urine and blood cultures were collected and he was started on empiric vancomycin and cefepime. Both sets of blood cultures have grown Enterococcus. He has developed some acute kidney injury.   Review of Systems: Review of Systems  Unable to perform ROS: Other  Constitutional:       This is a remote consultation so no review of systems was obtained.    Past Medical History:  Diagnosis Date  . Diabetes mellitus without complication ([place])   . Hypertension   . Schizophrenia ([place])     Social History   Tobacco Use  . Smoking status: Former Research scientist (life sciences)  . Smokeless tobacco: Never Used  Substance Use Topics  .  Alcohol use: Not Currently  . Drug use: Not Currently    History reviewed. No pertinent family history. Allergies  Allergen Reactions  . Acetaminophen Other (See Comments)    Unknown Unable to breathe Chest  tightness/SOB     OBJECTIVE: Blood pressure 136/74, pulse 90, temperature 99.5 F (37.5 C), temperature source Oral, resp. rate 14, height 5\' 9"  (1.753 m), weight 74 kg, SpO2 97 %.  Physical Exam Constitutional:      Comments: This is a remote consultation so no physical examination was performed.     Lab Results Lab Results  Component Value Date   WBC 10.6 (H) 05/15/2019    HGB 9.7 (L) 05/15/2019   HCT 28.2 (L) 05/15/2019   MCV 89.2 05/15/2019   PLT 102 (L) 05/15/2019    Lab Results  Component Value Date   CREATININE 1.54 (H) 05/15/2019   BUN 34 (H) 05/14/2019   NA 141 05/14/2019   K 3.6 05/14/2019   CL 101 05/14/2019   CO2 29 05/14/2019    Lab Results  Component Value Date   ALT 14 05/14/2019   AST 22 05/14/2019   ALKPHOS 46 05/14/2019   BILITOT 0.6 05/14/2019     Microbiology: Recent Results (from the past 240 hour(s))  CULTURE, BLOOD (ROUTINE X 2) w Reflex to ID Panel     Status: None (Preliminary result)   Collection Time: 05/14/19  1:33 PM   Specimen: BLOOD  Result Value Ref Range Status   Specimen Description BLOOD BLOOD RIGHT HAND  Final   Special Requests   Final    BOTTLES DRAWN AEROBIC AND ANAEROBIC Blood Culture adequate volume   Culture  Setup Time   Final    IN BOTH AEROBIC AND ANAEROBIC BOTTLES GRAM POSITIVE COCCI CRITICAL RESULT CALLED TO, READ BACK BY AND VERIFIED WITH: Cosby ON 05/15/19 AT 0149 [facility] Performed at [facility], [address] [place], [place] [address]    Culture GRAM POSITIVE COCCI  Final   Report Status PENDING  Incomplete  CULTURE, BLOOD (ROUTINE X 2) w Reflex to ID Panel     Status: None (Preliminary result)   Collection Time: 05/14/19  1:35 PM   Specimen: BLOOD  Result Value Ref Range Status   Specimen Description BLOOD RAC  Final   Special Requests   Final    BOTTLES DRAWN AEROBIC AND ANAEROBIC Blood Culture adequate volume   Culture  Setup Time   Final    Organism ID to follow AEROBIC BOTTLE ONLY GRAM POSITIVE COCCI CRITICAL VALUE NOTED.  VALUE IS CONSISTENT WITH PREVIOUSLY REPORTED AND CALLED VALUE. Performed at [facility], [address] [place], [place] [address]    Culture [facility] POSITIVE COCCI  Final   Report Status PENDING  Incomplete  Blood Culture ID Panel (Reflexed)     Status: Abnormal   Collection Time: 05/14/19  1:35 PM  Result Value Ref Range Status    Enterococcus species DETECTED (A) NOT DETECTED Final    Comment: CRITICAL RESULT CALLED TO, READ BACK BY AND VERIFIED WITH: SCOTT HALL ON 05/15/19 AT 0604 [facility]    Vancomycin resistance NOT DETECTED NOT DETECTED Final   Listeria monocytogenes NOT DETECTED NOT DETECTED Final   Staphylococcus species NOT DETECTED NOT DETECTED Final   Staphylococcus aureus (BCID) NOT DETECTED NOT DETECTED Final   Streptococcus species NOT DETECTED NOT DETECTED Final   Streptococcus agalactiae NOT DETECTED NOT DETECTED Final   Streptococcus pneumoniae NOT DETECTED NOT DETECTED Final   Streptococcus  pyogenes NOT DETECTED NOT DETECTED Final   Acinetobacter baumannii NOT DETECTED NOT DETECTED Final   Enterobacteriaceae species NOT DETECTED NOT DETECTED Final   Enterobacter cloacae complex NOT DETECTED NOT DETECTED Final   Escherichia coli NOT DETECTED NOT DETECTED Final   Klebsiella oxytoca NOT DETECTED NOT DETECTED Final   Klebsiella pneumoniae NOT DETECTED NOT DETECTED Final   Proteus species NOT DETECTED NOT DETECTED Final   Serratia marcescens NOT DETECTED NOT DETECTED Final   Haemophilus influenzae NOT DETECTED NOT DETECTED Final   Neisseria meningitidis NOT DETECTED NOT DETECTED Final   Pseudomonas aeruginosa NOT DETECTED NOT DETECTED Final   Candida albicans NOT DETECTED NOT DETECTED Final   Candida glabrata NOT DETECTED NOT DETECTED Final   Candida krusei NOT DETECTED NOT DETECTED Final   Candida parapsilosis NOT DETECTED NOT DETECTED Final   Candida tropicalis NOT DETECTED NOT DETECTED Final    Comment: Performed at [facility], [address] [place], [place] [address]  MRSA PCR Screening     Status: None   Collection Time: 05/14/19  9:14 PM   Specimen: Nasal Mucosa; Nasopharyngeal  Result Value Ref Range Status   MRSA by PCR NEGATIVE NEGATIVE Final    Comment:        The GeneXpert MRSA Assay (FDA approved for NASAL specimens only), is one component of a comprehensive MRSA  colonization surveillance program. It is not intended to diagnose MRSA infection nor to guide or monitor treatment for MRSA infections. Performed at [facility], [address], [place], [place] [address]     Michel Bickers, [place] [facility] [phone] pager   [phone] cell 05/15/2019, 11:04 AM

## 2019-05-15 NOTE — Progress Notes (Signed)
Patient transitioned off neosynephrine early in shift, BP stable. Patient transferred to room 259. Report called to [facility] on 2A. Left message for guardian regarding transfer.  During shift patient alert and oriented but with flat affect. Patient had to be prompted to do actions like eat and move in bed but capable of doing so on his own with prompting. Patient took pills with applesauce with no difficulties. Initially urine amber with swirls of blood and sediment but throughout shift cleared up to yellow and clear at 16:00. Patient able to stand with assistance and walk to his new bed.

## 2019-05-15 NOTE — Progress Notes (Signed)
PHARMACY NOTE:  ANTIMICROBIAL DOSAGE ADJUSTMENT  Current antimicrobial regimen includes a mismatch between antimicrobial dosage and estimated renal function.  As per policy approved by the Pharmacy & Therapeutics and Medical Executive Committees, the antimicrobial dosage will be adjusted accordingly.  Current antimicrobial dosage:  Ampicillin 2 grams IV every 8 hours  Indication: enterococcal bacteremia  Renal Function:  Estimated Creatinine Clearance: 50.4 mL/min (A) (by C-G formula based on SCr of 1.54 mg/dL (H)). []      On intermittent HD, scheduled: []      On CRRT    Antimicrobial dosage has been changed to:  Ampicillin 2 grams IV every 4 hours  Thank you for allowing pharmacy to be a part of this patient's care.  Dallie Piles, [facility] 05/15/2019 12:45 PM

## 2019-05-15 NOTE — Progress Notes (Signed)
    BRIEF OVERNIGHT PROGRESS REPORT  SUBJECTIVE: Patient remains hypotensive despite aggressive fluid resuscitation.  He is also noted with bloody urine output from the Foley catheter.  He is currently asymptomatic.  OBJECTIVE:On arrival to the bedside, he had low grade fever 99.1 with blood pressure 78/25mm Hg and pulse rate 84beats/min. There were no focal neurological deficits; he was alert but non verbal at baseline.  Assessment and plan abdomen and pelvis did not reveal distention or rebound tenderness.  Patient in no acute respiratory distress.  ASSESSMENT/PLAN:  1. Sepsis - now with significant hypotension - Suspect UTI as source of infection - CT abdomen and pelvis negative with no source of bleeding noted - Lactic acid improving - Urine cultures pending - Blood cultures 1 of 4 bottles with GPC - Empiric abx with vancomycin and Cefepime - IVFs and PRN bolus to keep MAP<66mmHg or SBP <28mmHg - Start Phenylephrine to keep MAP>65 - PCCM consult   2. Hematuria - Urinary retention with recent foley trauma - Foley continues to drain bloody urine otherwise no obvious frank bleeding - Urology following  3. Anemia - Unlikely that significant drop in hgb from  10.3 to 8.7 could be due to hematuria. Of note pt has hx of  Chronic Anemia - Transfuse 1 unit PRBCs - Check Ferritin, Iron panel, folate and B12 - At least 2x IV access, 18 gauge or larger - IVF resuscitation to maintain MAP>65 - H&H monitoring q6h - Blood Consent.  Transfuse PRN Hgb<8 - Hold anticoagulation as above    Rufina Falco, DNP, CCRN, [facility] Nurse Practitioner

## 2019-05-15 NOTE — Progress Notes (Signed)
Neo been running in PIV on right upper arm at high rate. RN been assessing site frequently. RN notified hospitalist about need for central line. Hospitalist unable to get central line placed. ICU had no provider this shift. ED provider consulted. ED provider refused to place central line, stating this was an ICU issue. PIV on right upper arm infiltrated. PIV removed. Hospitalist notified at bedside. Charge RN consulted. IV team consulted. Pharmacy consulted. Pharmacy suggested elevation of affected extremity and cold compress. RN elevated extremity and placed ice pack on affected site. IV team placed a midline and PIV in patient. Will continue to monitor.

## 2019-05-15 NOTE — Progress Notes (Addendum)
PROGRESS NOTE                                                                                                                                                                                                             Patient Demographics:    Peter Parsons, is a 59 y.o. male, DOB - 1960/06/12, [id number]  Admit date - 02/11/2019   Admitting Physician Lavina Hamman, MD  Outpatient Primary MD for the patient is System, Provider Not In  LOS - 50   Chief Complaint  Patient presents with  . ivc       Brief Narrative 59 year old male with chronic schizophrenia with frequent suicidal ideations, bipolar disorder with depression, type 2 diabetes mellitus (controlled), CKD stage II, peripheral neuropathy, BPH, hypertension presented with altered mental status.  Prolonged hospital stay awaiting placement in a memory care unit.  Does not have capacity.  Hospital course/significant events 3/31: Prolonged hospital stay of almost 6 weeks awaiting for placement.  Foley discontinued after prolonged use in the hospital (suspected was placed due to bladder outlet obstruction).  Intermittent bladder scan showing urinary retention. 4/1: Foley placed in due to urinary retention noted on repeat bladder scans.  Renal ultrasound done showed distended bladder with Foley not in the bladder. 4/2: Patient found to have gross hematuria with clots and Foley removed.  Urology consulted and a 1 French coud catheter placed.  Patient became septic with hypotension, fever and tachycardia.  Received 5 L normal saline bolus with minimal improvement and transferred to stepdown unit.  Started on Neo-Synephrine through peripheral IV and given 1 unit PRBC for acute blood loss anemia.     Subjective:     Assessment  & Plan :   Principal problem Severe sepsis with septic shock Secondary to catheter associated UTI. Patient had multiple straight caths  done past few days and Foley placed in on 4/1. Given almost 5 L normal saline bolus and transferred to stepdown unit and started on Neo-Synephrine drip through peripheral IV yesterday.  Blood pressure now stable and being weaned off pressors.  Continue normal saline at 125 cc/h. Blood culture growing Enterococcus.  Antibiotic narrowed to ampicillin.  Follow sensitivity and urine culture.   Active problems Gross hematuria Suspect traumatic catheterization.  Foley removed and urology placed in a 20 [place] coud Foley without resistance.  Recommend  intermittent irrigation, keep Foley upon discharge and follow-up with urology in [place]. Urine appears clear on exam this morning. Noted for drop in H&H and received 1 unit PRBC with improvement.  BPH with urinary retention Continue Foley.  Flomax.  Chronic schizophrenia Continue paliperidone, Klonopin, mirtazapine and risperidone.  History of frequent suicidal ideation.  Does not have capacity.  Outpatient psychiatry follow-up.  Depression with bipolar disorder Continue Lexapro and trazodone.  Acute on chronic kidney disease stage II Likely ATN with sepsis + bladder outlet obstruction.  Monitor with IV fluids.  Type 2 diabetes mellitus, controlled A1c of 4.5.  CBG stable.   Thrombocytopenia Unclear etiology.  Platelet stable.  Discontinued ibuprofen.  Peripheral neuropathy Continue gabapentin   Hypertension/ hyperlipidemia Off all blood pressure meds. Continue statin.  Dysphagia/dysarthria and left facial droop MRI negative for CVA.  Continue dysphagia diet  Severe protein calorie malnutrition Continue nutrition supplement and Remeron  Failure to thrive PT recommends home health with 24-hour supervision.  Awaiting placement.  Code Status : Full code  Family Communication  : None  Disposition Plan  : Was not accepted to group home.  Referral made to SNF.  Barriers For Discharge : Awaiting placement.  Hospital course now  prolonged with septic shock.  Consults  : Psychiatry, urology  Procedures  : Renal ultrasound  DVT Prophylaxis  : SCDs  Lab Results  Component Value Date   PLT 102 (L) 05/15/2019    Antibiotics  :   Anti-infectives (From admission, onward)   Start     Dose/Rate Route Frequency Ordered Stop   05/15/19 2200  ceFEPIme (MAXIPIME) 2 g in sodium chloride 0.9 % 100 mL IVPB  Status:  Discontinued     2 g 200 mL/hr over 30 Minutes Intravenous Every 12 hours 05/15/19 0724 05/15/19 1115   05/15/19 1500  vancomycin (VANCOREADY) IVPB 1750 mg/350 mL  Status:  Discontinued     1,750 mg 175 mL/hr over 120 Minutes Intravenous Every 24 hours 05/14/19 1340 05/15/19 0737   05/15/19 1400  ampicillin (OMNIPEN) 2 g in sodium chloride 0.9 % 100 mL IVPB     2 g 300 mL/hr over 20 Minutes Intravenous Every 8 hours 05/15/19 1115     05/15/19 1000  vancomycin (VANCOREADY) IVPB 1250 mg/250 mL  Status:  Discontinued     1,250 mg 166.7 mL/hr over 90 Minutes Intravenous Every 24 hours 05/15/19 0737 05/15/19 1115   05/14/19 1400  ceFEPIme (MAXIPIME) 2 g in sodium chloride 0.9 % 100 mL IVPB  Status:  Discontinued     2 g 200 mL/hr over 30 Minutes Intravenous Every 8 hours 05/14/19 1339 05/15/19 0724   05/14/19 1400  vancomycin (VANCOCIN) 2,000 mg in sodium chloride 0.9 % 500 mL IVPB     2,000 mg 250 mL/hr over 120 Minutes Intravenous  Once 05/14/19 1339 05/14/19 1709        Objective:   Vitals:   05/15/19 0600 05/15/19 0700 05/15/19 0800 05/15/19 0830  BP: (!) 111/58 106/68 108/64 136/74  Pulse: 80 68 80 90  Resp: 17 12 13 14   Temp:    99.5 F (37.5 C)  TempSrc:    Oral  SpO2: 97% 98% 97% 97%  Weight:      Height:        Wt Readings from Last 3 Encounters:  05/04/19 74 kg  01/25/19 90.3 kg  12/14/18 120.2 kg     Intake/Output Summary (Last 24 hours) at 05/15/2019 1127 Last data filed at 05/15/2019  0549 Gross per 24 hour  Intake 5842.61 ml  Output 575 ml  Net 5267.61 ml   Physical  exam Fatigued, not in distress HEENT: Moist mucosa, supple neck Chest: Clear CVS: Normal S1-S2, no murmurs GI: Soft, nondistended, nontender, Foley draining dark urine Musculoskeletal: Warm, no edema      Data Review:    CBC Recent Labs  Lab 05/10/19 1719 05/10/19 1719 05/14/19 0817 05/14/19 1311 05/14/19 2031 05/15/19 0059 05/15/19 0641  WBC 6.0  --  8.8  --   --  10.6*  --   HGB 11.9*   < > 11.9* 10.3* 8.7* 8.7* 9.7*  HCT 35.1*   < > 34.8* 29.9* 24.4* 25.5* 28.2*  PLT 159  --  146*  --   --  102*  --   MCV 89.3  --  89.0  --   --  89.2  --   MCH 30.3  --  30.4  --   --  30.4  --   MCHC 33.9  --  34.2  --   --  34.1  --   RDW 13.4  --  13.7  --   --  14.1  --    < > = values in this interval not displayed.    Chemistries  Recent Labs  Lab 05/10/19 1719 05/14/19 1333 05/15/19 0059  NA 142 141  --   K 3.8 3.6  --   CL 100 101  --   CO2 31 29  --   GLUCOSE 123* 174*  --   BUN 40* 34*  --   CREATININE 1.04 1.48* 1.54*  CALCIUM 9.4 9.1  --   AST  --  22  --   ALT  --  14  --   ALKPHOS  --  46  --   BILITOT  --  0.6  --    ------------------------------------------------------------------------------------------------------------------ No results for input(s): CHOL, HDL, LDLCALC, TRIG, CHOLHDL, LDLDIRECT in the last 72 hours.  Lab Results  Component Value Date   HGBA1C 4.5 (L) 03/28/2019   ------------------------------------------------------------------------------------------------------------------ No results for input(s): TSH, T4TOTAL, T3FREE, THYROIDAB in the last 72 hours.  Invalid input(s): FREET3 ------------------------------------------------------------------------------------------------------------------ Recent Labs    05/15/19 0059  VITAMINB12 276  FERRITIN 161  TIBC 168*  IRON 11*    Coagulation profile Recent Labs  Lab 05/14/19 0817  INR 1.1    No results for input(s): DDIMER in the last 72 hours.  Cardiac Enzymes No  results for input(s): CKMB, TROPONINI, MYOGLOBIN in the last 168 hours.  Invalid input(s): CK ------------------------------------------------------------------------------------------------------------------ No results found for: BNP  Inpatient Medications  Scheduled Meds: . atorvastatin  10 mg Oral Daily  . chlorhexidine  15 mL Mouth/Throat BID  . Chlorhexidine Gluconate Cloth  6 each Topical Daily  . escitalopram  10 mg Oral Daily  . feeding supplement (NEPRO CARB STEADY)  237 mL Oral TID BM  . feeding supplement (PRO-STAT SUGAR FREE 64)  30 mL Oral BID  . gabapentin  300 mg Oral TID  . mirtazapine  45 mg Oral QHS  . multivitamin with minerals  1 tablet Oral Daily  . risperiDONE  3 mg Oral BID  . senna-docusate  1 tablet Oral Daily  . sodium chloride flush  10-40 mL Intracatheter Q12H  . tamsulosin  0.4 mg Oral Daily  . traZODone  150 mg Oral QHS   Continuous Infusions: . sodium chloride 125 mL/hr at 05/15/19 0607  . ampicillin (OMNIPEN) IV    . phenylephrine (  NEO-SYNEPHRINE) Adult infusion 40 mcg/min (05/15/19 0549)  . sodium chloride     PRN Meds:.clonazePAM, dextrose, metoprolol tartrate, senna-docusate, sodium chloride flush  Micro Results Recent Results (from the past 240 hour(s))  CULTURE, BLOOD (ROUTINE X 2) w Reflex to ID Panel     Status: None (Preliminary result)   Collection Time: 05/14/19  1:33 PM   Specimen: BLOOD  Result Value Ref Range Status   Specimen Description BLOOD BLOOD RIGHT HAND  Final   Special Requests   Final    BOTTLES DRAWN AEROBIC AND ANAEROBIC Blood Culture adequate volume   Culture  Setup Time   Final    IN BOTH AEROBIC AND ANAEROBIC BOTTLES GRAM POSITIVE COCCI CRITICAL RESULT CALLED TO, READ BACK BY AND VERIFIED WITH: [place] ON 05/15/19 AT 0149 [facility] Performed at [facility], [address] [place], [place] [address]    Culture GRAM POSITIVE COCCI  Final   Report Status PENDING  Incomplete  CULTURE, BLOOD (ROUTINE X 2) w  Reflex to ID Panel     Status: None (Preliminary result)   Collection Time: 05/14/19  1:35 PM   Specimen: BLOOD  Result Value Ref Range Status   Specimen Description BLOOD RAC  Final   Special Requests   Final    BOTTLES DRAWN AEROBIC AND ANAEROBIC Blood Culture adequate volume   Culture  Setup Time   Final    Organism ID to follow AEROBIC BOTTLE ONLY GRAM POSITIVE COCCI CRITICAL VALUE NOTED.  VALUE IS CONSISTENT WITH PREVIOUSLY REPORTED AND CALLED VALUE. Performed at [facility], [address] [place], [place] [address]    Culture [facility] POSITIVE COCCI  Final   Report Status PENDING  Incomplete  Blood Culture ID Panel (Reflexed)     Status: Abnormal   Collection Time: 05/14/19  1:35 PM  Result Value Ref Range Status   Enterococcus species DETECTED (A) NOT DETECTED Final    Comment: CRITICAL RESULT CALLED TO, READ BACK BY AND VERIFIED WITH: SCOTT HALL ON 05/15/19 AT 0604 [facility]    Vancomycin resistance NOT DETECTED NOT DETECTED Final   Listeria monocytogenes NOT DETECTED NOT DETECTED Final   Staphylococcus species NOT DETECTED NOT DETECTED Final   Staphylococcus aureus (BCID) NOT DETECTED NOT DETECTED Final   Streptococcus species NOT DETECTED NOT DETECTED Final   Streptococcus agalactiae NOT DETECTED NOT DETECTED Final   Streptococcus pneumoniae NOT DETECTED NOT DETECTED Final   Streptococcus pyogenes NOT DETECTED NOT DETECTED Final   Acinetobacter baumannii NOT DETECTED NOT DETECTED Final   Enterobacteriaceae species NOT DETECTED NOT DETECTED Final   Enterobacter cloacae complex NOT DETECTED NOT DETECTED Final   Escherichia coli NOT DETECTED NOT DETECTED Final   Klebsiella oxytoca NOT DETECTED NOT DETECTED Final   Klebsiella pneumoniae NOT DETECTED NOT DETECTED Final   Proteus species NOT DETECTED NOT DETECTED Final   Serratia marcescens NOT DETECTED NOT DETECTED Final   Haemophilus influenzae NOT DETECTED NOT DETECTED Final   Neisseria meningitidis NOT DETECTED NOT  DETECTED Final   Pseudomonas aeruginosa NOT DETECTED NOT DETECTED Final   Candida albicans NOT DETECTED NOT DETECTED Final   Candida glabrata NOT DETECTED NOT DETECTED Final   Candida krusei NOT DETECTED NOT DETECTED Final   Candida parapsilosis NOT DETECTED NOT DETECTED Final   Candida tropicalis NOT DETECTED NOT DETECTED Final    Comment: Performed at [facility], [address] [place], [place] [address]  MRSA PCR Screening     Status: None   Collection Time:  05/14/19  9:14 PM   Specimen: Nasal Mucosa; Nasopharyngeal  Result Value Ref Range Status   MRSA by PCR NEGATIVE NEGATIVE Final    Comment:        The GeneXpert MRSA Assay (FDA approved for NASAL specimens only), is one component of a comprehensive MRSA colonization surveillance program. It is not intended to diagnose MRSA infection nor to guide or monitor treatment for MRSA infections. Performed at [facility], [address] [place], [place] [address]     Radiology Reports CT ABDOMEN PELVIS WO CONTRAST  Result Date: 05/15/2019 CLINICAL DATA:  Hematuria and decreased hemoglobin EXAM: CT ABDOMEN AND PELVIS WITHOUT CONTRAST TECHNIQUE: Multidetector CT imaging of the abdomen and pelvis was performed following the standard protocol without IV contrast. COMPARISON:  03/20/2019 FINDINGS: Lower chest: Bibasilar atelectatic changes are noted. No sizable effusion is seen. Cardiac blood pool demonstrates decreased attenuation consistent with underlying anemia. Hepatobiliary: Liver is within normal limits. Gallbladder is well distended. Multiple gallstones are seen without complicating factors. Pancreas: Unremarkable. No pancreatic ductal dilatation or surrounding inflammatory changes. Spleen: Normal in size without focal abnormality. Adrenals/Urinary Tract: Adrenal glands are within normal limits. Kidneys demonstrate no renal calculi or obstructive changes. Stable hypodensity in the lower pole of the right kidney  is noted consistent with cyst. Chronic distal left ureteral Fernandez is noted which measures 9 mm. The bladder is partially distended with a Foley catheter in place. Stomach/Bowel: The appendix is not well visualized although no inflammatory changes are seen to suggest appendicitis. No obstructive or inflammatory changes of the colon or small bowel are seen. The stomach is decompressed. Vascular/Lymphatic: Aortic atherosclerosis. No enlarged abdominal or pelvic lymph nodes. Reproductive: Prostate shows multiple radiotherapy seeds. Other: No abdominal wall hernia or abnormality. No abdominopelvic ascites. Musculoskeletal: Degenerative changes are noted without acute abnormality. IMPRESSION: Multiple gallstones without complicating factors. Bibasilar atelectasis. Chronic distal left ureteral K[place]per. Foley catheter in the bladder. Electronically Signed   By: Inez Catalina M.D.   On: 05/15/2019 00:23   DG Chest 2 View  Result Date: 04/28/2019 CLINICAL DATA:  Fever.  Unresponsive. EXAM: CHEST - 2 VIEW COMPARISON:  02/12/2019 FINDINGS: The heart size and mediastinal contours are within normal limits. Both lungs are clear. The visualized skeletal structures are unremarkable. IMPRESSION: No active cardiopulmonary disease. Electronically Signed   By: Kerby Moors M.D.   On: 04/28/2019 10:00   US RENAL  Result Date: 05/13/2019 CLINICAL DATA:  Urinary retention EXAM: RENAL / URINARY TRACT ULTRASOUND COMPLETE COMPARISON:  CT 03/20/2019 FINDINGS: Right Kidney: Renal measurements: 12.1 x 5.3 x 4.9 cm = volume: 164 mL. Cortical echogenicity is normal. Prominent right extrarenal pelvis without calyceal dilatation. Cyst lower pole measuring 2.5 cm. Left Kidney: Renal measurements: 11.8 x 4.9 x 5.3 cm = volume: 159 mL. Cortical echogenicity is normal. No hydronephrosis. No mass. Bladder: Distended urinary bladder. Other: Spleen slightly enlarged. IMPRESSION: 1. Slight enlargement of right extrarenal pelvis but without definitive  hydronephrosis. Cyst in the right kidney 2. Kidneys are otherwise unremarkable 3. Distended urinary bladder. Notation states that patient has Foley catheter but no catheter visible by ultrasound. Correlate with physical exam 4. Enlarged spleen Electronically Signed   By: Donavan Foil M.D.   On: 05/13/2019 17:38   DG Chest Port 1 View  Result Date: 05/14/2019 CLINICAL DATA:  Fever, diabetes mellitus, hypertension, former smoker EXAM: PORTABLE CHEST 1 VIEW COMPARISON:  Portable exam 1338 hours compared to 04/28/2019 FINDINGS: Normal heart size, mediastinal contours, and pulmonary vascularity. Tortuous aorta.  Lungs clear. No infiltrate, pleural effusion or pneumothorax. Suspect LEFT nipple shadow. Osseous structures unremarkable IMPRESSION: No acute abnormalities. Suspected LEFT nipple shadow; follow-up upright PA chest radiograph with nipple markers recommended to exclude pulmonary nodule. Electronically Signed   By: Lavonia Dana M.D.   On: 05/14/2019 13:53    Time Spent in minutes 35   Elenore Wanninger M.D on 05/15/2019 at 11:27 AM  Between 7am to 7pm - Pager - [phone]  After 7pm go to [email or web address] - password [facility]  [facility] -  Office  [phone]

## 2019-05-15 NOTE — Consult Note (Signed)
Pharmacy Antibiotic Note  [name] is a 59 y.o. male admitted on 02/11/2019 with sepsis.  Pharmacy has been consulted for Cefepime/Vancomycin dosing. Prolonged hospital stay awaiting placement in a memory care unit. Foley placed in by nurse 4/1 afternoon due to persistent urinary retention. Foley removed and patient still having active hematuria with clots.  Patient reported some suprapubic discomfort.  Urinalysis negative (nitrite); small leukocytes; many bacteria; pyuria. Since admission his blood has grown out Enterococcus sp, renal function is worsening and WBC trend is up  Plan: 1. Adjust vancomycin to 1250 mg IV Q 24 hrs Goal AUC 400-550 Expected AUC: 507.7 SCr used: 1.54 Css (calculated): 35/12.4 mcg/mL T1/2: 15.1h  2. Adjust cefepime to 2 grams IV every 12 hours  Height: 5\' 9"  (175.3 cm) Weight: 74 kg (163 lb 2.3 oz) IBW/kg (Calculated) : 70.7  Temp (24hrs), Avg:99.2 F (37.3 C), Min:97.9 F (36.6 C), Max:103 F (39.4 C)  Recent Labs  Lab 05/10/19 1719 05/14/19 0817 05/14/19 1317 05/14/19 1333 05/14/19 1622 05/15/19 0059  WBC 6.0 8.8  --   --   --  10.6*  CREATININE 1.04  --   --  1.48*  --  1.54*  LATICACIDVEN  --   --  3.8*  --  1.9  --     Estimated Creatinine Clearance: 50.4 mL/min (A) (by C-G formula based on SCr of 1.54 mg/dL (H)).     Antimicrobials this admission: 4/2 Cefepime/Vancomcyin    Microbiology results: 4/2 BCx: Enterococcus sp 4/2 UCx: pending 4/2 MRSA PCR: negative  Thank you for allowing pharmacy to be a part of this patient's care.  Dallie Piles 05/15/2019 7:26 AM

## 2019-05-16 DIAGNOSIS — B952 Enterococcus as the cause of diseases classified elsewhere: Secondary | ICD-10-CM | POA: Diagnosis not present

## 2019-05-16 DIAGNOSIS — T83511D Infection and inflammatory reaction due to indwelling urethral catheter, subsequent encounter: Secondary | ICD-10-CM

## 2019-05-16 DIAGNOSIS — R338 Other retention of urine: Secondary | ICD-10-CM | POA: Diagnosis not present

## 2019-05-16 DIAGNOSIS — R7881 Bacteremia: Secondary | ICD-10-CM | POA: Diagnosis not present

## 2019-05-16 LAB — BPAM RBC
Blood Product Expiration Date: 202104052359
ISSUE DATE / TIME: 202104030313
Unit Type and Rh: 5100

## 2019-05-16 LAB — BASIC METABOLIC PANEL
Anion gap: 7 (ref 5–15)
BUN: 39 mg/dL — ABNORMAL HIGH (ref 8–23)
CO2: 24 mmol/L (ref 22–32)
Calcium: 8.2 mg/dL — ABNORMAL LOW (ref 8.9–10.3)
Chloride: 111 mmol/L (ref 98–111)
Creatinine, Ser: 1.08 mg/dL (ref 0.61–1.24)
GFR calc Af Amer: >60 mL/min (ref >60)
GFR calc non Af Amer: >60 mL/min (ref >60)
Glucose, Bld: 90 mg/dL (ref 70–99)
Potassium: 3.4 mmol/L — ABNORMAL LOW (ref 3.5–5.1)
Sodium: 142 mmol/L (ref 135–145)

## 2019-05-16 LAB — CBC
HCT: 27.1 % — ABNORMAL LOW (ref 39.0–52.0)
Hemoglobin: 9.3 g/dL — ABNORMAL LOW (ref 13.0–17.0)
MCH: 30.6 pg (ref 26.0–34.0)
MCHC: 34.3 g/dL (ref 30.0–36.0)
MCV: 89.1 fL (ref 80.0–100.0)
Platelets: 88 10*3/uL — ABNORMAL LOW (ref 150–400)
RBC: 3.04 MIL/uL — ABNORMAL LOW (ref 4.22–5.81)
RDW: 14.3 % (ref 11.5–15.5)
WBC: 6.8 10*3/uL (ref 4.0–10.5)
nRBC: 0 % (ref 0.0–0.2)

## 2019-05-16 LAB — URINE CULTURE
Culture: >=100000 — AB
Special Requests: NORMAL

## 2019-05-16 LAB — TYPE AND SCREEN
ABO/RH(D): O POS
Antibody Screen: NEGATIVE
Unit division: 0

## 2019-05-16 MED ORDER — POTASSIUM CHLORIDE CRYS ER 20 MEQ PO TBCR
40.0000 meq | EXTENDED_RELEASE_TABLET | Freq: Once | ORAL | Status: AC
  Administered 2019-05-16: 40 meq via ORAL
  Filled 2019-05-16: qty 2

## 2019-05-16 NOTE — Progress Notes (Signed)
PROGRESS NOTE                                                                                                                                                                                                             Patient Demographics:    Peter Parsons, is a 59 y.o. male, DOB - 1960/12/11, [id number]  Admit date - 02/11/2019   Admitting Physician Lavina Hamman, MD  Outpatient Primary MD for the patient is System, Provider Not In  LOS - 60   Chief Complaint  Patient presents with  . ivc       Brief Narrative 59 year old male with chronic schizophrenia with frequent suicidal ideations, bipolar disorder with depression, type 2 diabetes mellitus (controlled), CKD stage II, peripheral neuropathy, BPH, hypertension presented with altered mental status.  Prolonged hospital stay awaiting placement in a memory care unit.  Does not have capacity.  Hospital course/significant events 3/31: Prolonged hospital stay of almost 6 weeks awaiting for placement.  Foley discontinued after prolonged use in the hospital (suspected was placed due to bladder outlet obstruction).  Intermittent bladder scan showing urinary retention. 4/1: Foley placed in due to urinary retention noted on repeat bladder scans.  Renal ultrasound done showed distended bladder with Foley not in the bladder. 4/2: Patient found to have gross hematuria with clots and Foley removed.  Urology consulted and a 83 French coud catheter placed.  Patient became septic with hypotension, fever and tachycardia.  Received 5 L normal saline bolus with minimal improvement and transferred to stepdown unit.  Started on Neo-Synephrine through peripheral IV and given 1 unit PRBC for acute blood loss anemia.  4/3: Stable off pressors and transferred back to medical floor.   Subjective:   Patient transferred to medical floor being of Neo-Synephrine drip patient has remained stable.   Denies any symptoms.  Assessment  & Plan :   Principal problem Severe sepsis with septic shock Secondary to catheter associated UTI. Patient had multiple straight caths done past few days and Foley placed in on 4/1. Transferred to stepdown unit and multiple IV normal saline bolus received.  Required Neo-Synephrine overnight and now stable off pressors.  Sepsis resolved. Blood and urine culture growing Enterococcus.  Antibiotic narrowed to ampicillin.  Follow final sensitivity.     Active problems Gross hematuria Secondary to traumatic catheterization.  Foley removed  and urology placed in a 20 [place] coud Foley without resistance.  Recommend intermittent irrigation, keep Foley upon discharge and follow-up with urology in [place]. Urine now clear.  Received 1 unit PRBC for drop in H&H.   BPH with urinary retention Continue Foley.  Flomax.  Chronic schizophrenia Continue paliperidone, Klonopin, mirtazapine and risperidone.  History of frequent suicidal ideation.  Does not have capacity.  Outpatient psychiatry follow-up.  Depression with bipolar disorder Continue Lexapro and trazodone.  Acute on chronic kidney disease stage II Likely ATN with sepsis + bladder outlet obstruction.  Monitor with IV fluids.  Type 2 diabetes mellitus, controlled A1c of 4.5.  CBG stable.   Thrombocytopenia Unclear etiology.  Platelet stable.  Discontinued ibuprofen.  Peripheral neuropathy Continue gabapentin   Hypertension/ hyperlipidemia Off all blood pressure meds. Continue statin.  Dysphagia/dysarthria and left facial droop MRI negative for CVA.  Continue dysphagia diet  Severe protein calorie malnutrition Continue nutrition supplement and Remeron  Failure to thrive PT recommends home health with 24-hour supervision.  Awaiting placement.  Code Status : Full code  Family Communication  : None  Disposition Plan  : Was not accepted to group home.  Referral made to SNF.  Barriers  For Discharge : Awaiting placement.  Hospital course also got prolonged with sepsis.  Consults  : Psychiatry, urology  Procedures  : Renal ultrasound  DVT Prophylaxis  : SCDs  Lab Results  Component Value Date   PLT 88 (L) 05/16/2019    Antibiotics  :   Anti-infectives (From admission, onward)   Start     Dose/Rate Route Frequency Ordered Stop   05/16/19 0800  ampicillin (OMNIPEN) 2 g in sodium chloride 0.9 % 100 mL IVPB     2 g 300 mL/hr over 20 Minutes Intravenous Every 4 hours 05/15/19 1244     05/15/19 2200  ceFEPIme (MAXIPIME) 2 g in sodium chloride 0.9 % 100 mL IVPB  Status:  Discontinued     2 g 200 mL/hr over 30 Minutes Intravenous Every 12 hours 05/15/19 0724 05/15/19 1115   05/15/19 2200  ampicillin (OMNIPEN) 2 g in sodium chloride 0.9 % 100 mL IVPB  Status:  Discontinued     2 g 300 mL/hr over 20 Minutes Intravenous Every 8 hours 05/15/19 1115 05/15/19 1244   05/15/19 1500  vancomycin (VANCOREADY) IVPB 1750 mg/350 mL  Status:  Discontinued     1,750 mg 175 mL/hr over 120 Minutes Intravenous Every 24 hours 05/14/19 1340 05/15/19 0737   05/15/19 1000  vancomycin (VANCOREADY) IVPB 1250 mg/250 mL  Status:  Discontinued     1,250 mg 166.7 mL/hr over 90 Minutes Intravenous Every 24 hours 05/15/19 0737 05/15/19 1115   05/14/19 1400  ceFEPIme (MAXIPIME) 2 g in sodium chloride 0.9 % 100 mL IVPB  Status:  Discontinued     2 g 200 mL/hr over 30 Minutes Intravenous Every 8 hours 05/14/19 1339 05/15/19 0724   05/14/19 1400  vancomycin (VANCOCIN) 2,000 mg in sodium chloride 0.9 % 500 mL IVPB     2,000 mg 250 mL/hr over 120 Minutes Intravenous  Once 05/14/19 1339 05/14/19 1709        Objective:   Vitals:   05/15/19 1643 05/15/19 1917 05/16/19 0548 05/16/19 0753  BP: 137/72 117/69 (!) 155/84 (!) 118/49  Pulse: 92 89 85 (!) 58  Resp: 18 20 18 17   Temp: 98.9 F (37.2 C) 99.4 F (37.4 C) 98.2 F (36.8 C)   TempSrc: Oral Oral Oral  SpO2:  97% 97% 96%  Weight:   85.2 kg     Height:        Wt Readings from Last 3 Encounters:  05/16/19 85.2 kg  01/25/19 90.3 kg  12/14/18 120.2 kg     Intake/Output Summary (Last 24 hours) at 05/16/2019 1123 Last data filed at 05/16/2019 0600 Gross per 24 hour  Intake 1673.64 ml  Output 2450 ml  Net -776.36 ml    Physical exam Not in distress, poorly communicative HEENT: Moist mucosa, supple neck Chest: Clear bilaterally CVs: Normal S1-S2 GI: Soft, nondistended, nontender, Foley draining clear urine Musculoskeletal: Warm, no edema     Data Review:    CBC Recent Labs  Lab 05/10/19 1719 05/10/19 1719 05/14/19 0817 05/14/19 0817 05/14/19 1311 05/14/19 2031 05/15/19 0059 05/15/19 0641 05/16/19 0555  WBC 6.0  --  8.8  --   --   --  10.6*  --  6.8  HGB 11.9*   < > 11.9*   < > 10.3* 8.7* 8.7* 9.7* 9.3*  HCT 35.1*   < > 34.8*   < > 29.9* 24.4* 25.5* 28.2* 27.1*  PLT 159  --  146*  --   --   --  102*  --  88*  MCV 89.3  --  89.0  --   --   --  89.2  --  89.1  MCH 30.3  --  30.4  --   --   --  30.4  --  30.6  MCHC 33.9  --  34.2  --   --   --  34.1  --  34.3  RDW 13.4  --  13.7  --   --   --  14.1  --  14.3   < > = values in this interval not displayed.    Chemistries  Recent Labs  Lab 05/10/19 1719 05/14/19 1333 05/15/19 0059 05/16/19 0555  NA 142 141  --  142  K 3.8 3.6  --  3.4*  CL 100 101  --  111  CO2 31 29  --  24  GLUCOSE 123* 174*  --  90  BUN 40* 34*  --  39*  CREATININE 1.04 1.48* 1.54* 1.08  CALCIUM 9.4 9.1  --  8.2*  AST  --  22  --   --   ALT  --  14  --   --   ALKPHOS  --  46  --   --   BILITOT  --  0.6  --   --    ------------------------------------------------------------------------------------------------------------------ No results for input(s): CHOL, HDL, LDLCALC, TRIG, CHOLHDL, LDLDIRECT in the last 72 hours.  Lab Results  Component Value Date   HGBA1C 4.5 (L) 03/28/2019    ------------------------------------------------------------------------------------------------------------------ No results for input(s): TSH, T4TOTAL, T3FREE, THYROIDAB in the last 72 hours.  Invalid input(s): FREET3 ------------------------------------------------------------------------------------------------------------------ Recent Labs    05/15/19 0059  VITAMINB12 276  FERRITIN 161  TIBC 168*  IRON 11*    Coagulation profile Recent Labs  Lab 05/14/19 0817  INR 1.1    No results for input(s): DDIMER in the last 72 hours.  Cardiac Enzymes No results for input(s): CKMB, TROPONINI, MYOGLOBIN in the last 168 hours.  Invalid input(s): CK ------------------------------------------------------------------------------------------------------------------ No results found for: BNP  Inpatient Medications  Scheduled Meds: . atorvastatin  10 mg Oral Daily  . chlorhexidine  15 mL Mouth/Throat BID  . Chlorhexidine Gluconate Cloth  6 each Topical Daily  . escitalopram  10 mg Oral  Daily  . feeding supplement (NEPRO CARB STEADY)  237 mL Oral TID BM  . feeding supplement (PRO-STAT SUGAR FREE 64)  30 mL Oral BID  . gabapentin  300 mg Oral TID  . mirtazapine  45 mg Oral QHS  . multivitamin with minerals  1 tablet Oral Daily  . risperiDONE  3 mg Oral BID  . senna-docusate  1 tablet Oral Daily  . sodium chloride flush  10-40 mL Intracatheter Q12H  . tamsulosin  0.4 mg Oral Daily  . traZODone  150 mg Oral QHS   Continuous Infusions: . ampicillin (OMNIPEN) IV 2 g (05/16/19 1015)   PRN Meds:.clonazePAM, dextrose, metoprolol tartrate, senna-docusate, sodium chloride flush  Micro Results Recent Results (from the past 240 hour(s))  CULTURE, BLOOD (ROUTINE X 2) w Reflex to ID Panel     Status: None (Preliminary result)   Collection Time: 05/14/19  1:33 PM   Specimen: BLOOD  Result Value Ref Range Status   Specimen Description BLOOD BLOOD RIGHT HAND  Final   Special Requests    Final    BOTTLES DRAWN AEROBIC AND ANAEROBIC Blood Culture adequate volume   Culture  Setup Time   Final    IN BOTH AEROBIC AND ANAEROBIC BOTTLES GRAM POSITIVE COCCI CRITICAL RESULT CALLED TO, READ BACK BY AND VERIFIED WITH: [place] ON 05/15/19 AT 0149 [facility] Performed at [facility], [address] [place], [place] [address]    Culture GRAM POSITIVE COCCI  Final   Report Status PENDING  Incomplete  CULTURE, BLOOD (ROUTINE X 2) w Reflex to ID Panel     Status: None (Preliminary result)   Collection Time: 05/14/19  1:35 PM   Specimen: BLOOD  Result Value Ref Range Status   Specimen Description   Final    BLOOD RIGHT ANTECUBITAL Performed at [facility], [place] [address] [place], [place] [address]    Special Requests   Final    BOTTLES DRAWN AEROBIC AND ANAEROBIC Blood Culture adequate volume   Culture  Setup Time   Final    Organism ID to follow IN BOTH AEROBIC AND ANAEROBIC BOTTLES GRAM POSITIVE COCCI IN PAIRS CRITICAL VALUE NOTED.  VALUE IS CONSISTENT WITH PREVIOUSLY REPORTED AND CALLED VALUE. Performed at [facility], [address] [place], [place] [address]    Culture [facility] POSITIVE COCCI  Final   Report Status PENDING  Incomplete  Blood Culture ID Panel (Reflexed)     Status: Abnormal   Collection Time: 05/14/19  1:35 PM  Result Value Ref Range Status   Enterococcus species DETECTED (A) NOT DETECTED Final    Comment: CRITICAL RESULT CALLED TO, READ BACK BY AND VERIFIED WITH: SCOTT HALL ON 05/15/19 AT 0604 [facility]    Vancomycin resistance NOT DETECTED NOT DETECTED Final   Listeria monocytogenes NOT DETECTED NOT DETECTED Final   Staphylococcus species NOT DETECTED NOT DETECTED Final   Staphylococcus aureus (BCID) NOT DETECTED NOT DETECTED Final   Streptococcus species NOT DETECTED NOT DETECTED Final   Streptococcus agalactiae NOT DETECTED NOT DETECTED Final   Streptococcus pneumoniae NOT DETECTED NOT DETECTED Final   Streptococcus pyogenes NOT  DETECTED NOT DETECTED Final   Acinetobacter baumannii NOT DETECTED NOT DETECTED Final   Enterobacteriaceae species NOT DETECTED NOT DETECTED Final   Enterobacter cloacae complex NOT DETECTED NOT DETECTED Final   Escherichia coli NOT DETECTED NOT DETECTED Final   Klebsiella oxytoca NOT DETECTED NOT DETECTED Final   Klebsiella pneumoniae NOT DETECTED NOT DETECTED Final  Proteus species NOT DETECTED NOT DETECTED Final   Serratia marcescens NOT DETECTED NOT DETECTED Final   Haemophilus influenzae NOT DETECTED NOT DETECTED Final   Neisseria meningitidis NOT DETECTED NOT DETECTED Final   Pseudomonas aeruginosa NOT DETECTED NOT DETECTED Final   Candida albicans NOT DETECTED NOT DETECTED Final   Candida glabrata NOT DETECTED NOT DETECTED Final   Candida krusei NOT DETECTED NOT DETECTED Final   Candida parapsilosis NOT DETECTED NOT DETECTED Final   Candida tropicalis NOT DETECTED NOT DETECTED Final    Comment: Performed at [facility], [address], [place], [place] [address]  Urine Culture     Status: Abnormal   Collection Time: 05/14/19  1:51 PM   Specimen: Urine, Catheterized  Result Value Ref Range Status   Specimen Description   Final    URINE, CATHETERIZED Performed at [facility], [address] [place], [place] [address]    Special Requests   Final    Normal Performed at [facility], [address] [place], [place] [address]    Culture >=100,000 COLONIES/mL ENTEROCOCCUS FAECALIS (A)  Final   Report Status 05/16/2019 FINAL  Final   Organism ID, Bacteria ENTEROCOCCUS FAECALIS (A)  Final      Susceptibility   Enterococcus faecalis - MIC*    AMPICILLIN <=2 SENSITIVE Sensitive     NITROFURANTOIN <=16 SENSITIVE Sensitive     VANCOMYCIN 1 SENSITIVE Sensitive     * >=100,000 COLONIES/mL ENTEROCOCCUS FAECALIS  MRSA PCR Screening     Status: None   Collection Time: 05/14/19  9:14 PM   Specimen: Nasal Mucosa; Nasopharyngeal  Result Value Ref Range  Status   MRSA by PCR NEGATIVE NEGATIVE Final    Comment:        The GeneXpert MRSA Assay (FDA approved for NASAL specimens only), is one component of a comprehensive MRSA colonization surveillance program. It is not intended to diagnose MRSA infection nor to guide or monitor treatment for MRSA infections. Performed at [facility], [address] [place], [place] [address]   Culture, blood (routine x 2)     Status: None (Preliminary result)   Collection Time: 05/15/19 11:23 AM   Specimen: BLOOD  Result Value Ref Range Status   Specimen Description BLOOD RIGHT [facility]  Final   Special Requests BLOOD Blood Culture adequate volume  Final   Culture   Final    NO GROWTH < 24 HOURS Performed at [facility], [address] [place], [place] [address]    Report Status PENDING  Incomplete  Culture, blood (routine x 2)     Status: None (Preliminary result)   Collection Time: 05/15/19 12:47 PM   Specimen: BLOOD  Result Value Ref Range Status   Specimen Description BLOOD BLRA  Final   Special Requests   Final    BOTTLES DRAWN AEROBIC AND ANAEROBIC Blood Culture results may not be optimal due to an excessive volume of blood received in culture bottles   Culture   Final    NO GROWTH < 24 HOURS Performed at [facility], [address], [place], [place] [address]    Report Status PENDING  Incomplete    Radiology Reports CT ABDOMEN PELVIS WO CONTRAST  Result Date: 05/15/2019 CLINICAL DATA:  Hematuria and decreased hemoglobin EXAM: CT ABDOMEN AND PELVIS WITHOUT CONTRAST TECHNIQUE: Multidetector CT imaging of the abdomen and pelvis was performed following the standard protocol without IV contrast. COMPARISON:  03/20/2019 FINDINGS: Lower chest: Bibasilar atelectatic changes are  noted. No sizable effusion is seen. Cardiac blood pool demonstrates decreased attenuation consistent with underlying anemia. Hepatobiliary: Liver is within normal limits. Gallbladder is well  distended. Multiple gallstones are seen without complicating factors. Pancreas: Unremarkable. No pancreatic ductal dilatation or surrounding inflammatory changes. Spleen: Normal in size without focal abnormality. Adrenals/Urinary Tract: Adrenal glands are within normal limits. Kidneys demonstrate no renal calculi or obstructive changes. Stable hypodensity in the lower pole of the right kidney is noted consistent with cyst. Chronic distal left ureteral Stratmann is noted which measures 9 mm. The bladder is partially distended with a Foley catheter in place. Stomach/Bowel: The appendix is not well visualized although no inflammatory changes are seen to suggest appendicitis. No obstructive or inflammatory changes of the colon or small bowel are seen. The stomach is decompressed. Vascular/Lymphatic: Aortic atherosclerosis. No enlarged abdominal or pelvic lymph nodes. Reproductive: Prostate shows multiple radiotherapy seeds. Other: No abdominal wall hernia or abnormality. No abdominopelvic ascites. Musculoskeletal: Degenerative changes are noted without acute abnormality. IMPRESSION: Multiple gallstones without complicating factors. Bibasilar atelectasis. Chronic distal left ureteral [place]. Foley catheter in the bladder. Electronically Signed   By: Inez Catalina M.D.   On: 05/15/2019 00:23   DG Chest 2 View  Result Date: 04/28/2019 CLINICAL DATA:  Fever.  Unresponsive. EXAM: CHEST - 2 VIEW COMPARISON:  02/12/2019 FINDINGS: The heart size and mediastinal contours are within normal limits. Both lungs are clear. The visualized skeletal structures are unremarkable. IMPRESSION: No active cardiopulmonary disease. Electronically Signed   By: Kerby Moors M.D.   On: 04/28/2019 10:00   US RENAL  Result Date: 05/13/2019 CLINICAL DATA:  Urinary retention EXAM: RENAL / URINARY TRACT ULTRASOUND COMPLETE COMPARISON:  CT 03/20/2019 FINDINGS: Right Kidney: Renal measurements: 12.1 x 5.3 x 4.9 cm = volume: 164 mL. Cortical  echogenicity is normal. Prominent right extrarenal pelvis without calyceal dilatation. Cyst lower pole measuring 2.5 cm. Left Kidney: Renal measurements: 11.8 x 4.9 x 5.3 cm = volume: 159 mL. Cortical echogenicity is normal. No hydronephrosis. No mass. Bladder: Distended urinary bladder. Other: Spleen slightly enlarged. IMPRESSION: 1. Slight enlargement of right extrarenal pelvis but without definitive hydronephrosis. Cyst in the right kidney 2. Kidneys are otherwise unremarkable 3. Distended urinary bladder. Notation states that patient has Foley catheter but no catheter visible by ultrasound. Correlate with physical exam 4. Enlarged spleen Electronically Signed   By: Donavan Foil M.D.   On: 05/13/2019 17:38   DG Chest Port 1 View  Result Date: 05/14/2019 CLINICAL DATA:  Fever, diabetes mellitus, hypertension, former smoker EXAM: PORTABLE CHEST 1 VIEW COMPARISON:  Portable exam 1338 hours compared to 04/28/2019 FINDINGS: Normal heart size, mediastinal contours, and pulmonary vascularity. Tortuous aorta. Lungs clear. No infiltrate, pleural effusion or pneumothorax. Suspect LEFT nipple shadow. Osseous structures unremarkable IMPRESSION: No acute abnormalities. Suspected LEFT nipple shadow; follow-up upright PA chest radiograph with nipple markers recommended to exclude pulmonary nodule. Electronically Signed   By: Lavonia Dana M.D.   On: 05/14/2019 13:53    Time Spent in minutes 35   Ethyn Schetter M.D on 05/16/2019 at 11:23 AM  Between 7am to 7pm - Pager - [phone]  After 7pm go to [email or web address] - password [facility]  [facility] -  Office  [phone]

## 2019-05-16 NOTE — Progress Notes (Signed)
Patient ID: Peter Parsons, male   DOB: 25-Jul-1960, 59 y.o.   MRN: [id number]          [facility]    Date of Admission:  02/11/2019   Total days of antibiotics 3        Day 2 ampicillin  [name] developed acute enterococcal urinary tract infection complicated by bacteremia following urethral trauma caused by Foley placement for urinary retention.  He has defervesced and is now off of pressors.  Repeat blood cultures are negative at 24 hours.  No valvular abnormalities were noted on a transthoracic echocardiogram done in February.  I do not feel strongly that he needs a repeat TTE given that this was detected quickly and treated promptly.  I think the chance that he has endocarditis is extremely low.  He should do well with 5 to 7 days of ampicillin therapy.         Michel Bickers, MD [facility] [phone] pager   [phone] cell 05/16/2019, 12:39 PM

## 2019-05-17 DIAGNOSIS — N390 Urinary tract infection, site not specified: Secondary | ICD-10-CM | POA: Diagnosis not present

## 2019-05-17 DIAGNOSIS — N400 Enlarged prostate without lower urinary tract symptoms: Secondary | ICD-10-CM | POA: Diagnosis not present

## 2019-05-17 DIAGNOSIS — Z79899 Other long term (current) drug therapy: Secondary | ICD-10-CM

## 2019-05-17 DIAGNOSIS — F259 Schizoaffective disorder, unspecified: Secondary | ICD-10-CM

## 2019-05-17 DIAGNOSIS — K0889 Other specified disorders of teeth and supporting structures: Secondary | ICD-10-CM

## 2019-05-17 DIAGNOSIS — B952 Enterococcus as the cause of diseases classified elsewhere: Secondary | ICD-10-CM | POA: Diagnosis not present

## 2019-05-17 DIAGNOSIS — R7881 Bacteremia: Secondary | ICD-10-CM | POA: Diagnosis not present

## 2019-05-17 DIAGNOSIS — C61 Malignant neoplasm of prostate: Secondary | ICD-10-CM

## 2019-05-17 DIAGNOSIS — Z960 Presence of urogenital implants: Secondary | ICD-10-CM

## 2019-05-17 DIAGNOSIS — Z888 Allergy status to other drugs, medicaments and biological substances status: Secondary | ICD-10-CM

## 2019-05-17 DIAGNOSIS — Z87891 Personal history of nicotine dependence: Secondary | ICD-10-CM

## 2019-05-17 DIAGNOSIS — I10 Essential (primary) hypertension: Secondary | ICD-10-CM

## 2019-05-17 DIAGNOSIS — K5900 Constipation, unspecified: Secondary | ICD-10-CM

## 2019-05-17 DIAGNOSIS — E119 Type 2 diabetes mellitus without complications: Secondary | ICD-10-CM

## 2019-05-17 LAB — CBC
HCT: 27.3 % — ABNORMAL LOW (ref 39.0–52.0)
Hemoglobin: 9.3 g/dL — ABNORMAL LOW (ref 13.0–17.0)
MCH: 30.2 pg (ref 26.0–34.0)
MCHC: 34.1 g/dL (ref 30.0–36.0)
MCV: 88.6 fL (ref 80.0–100.0)
Platelets: 98 10*3/uL — ABNORMAL LOW (ref 150–400)
RBC: 3.08 MIL/uL — ABNORMAL LOW (ref 4.22–5.81)
RDW: 14.1 % (ref 11.5–15.5)
WBC: 5.5 10*3/uL (ref 4.0–10.5)
nRBC: 0 % (ref 0.0–0.2)

## 2019-05-17 LAB — CULTURE, BLOOD (ROUTINE X 2)
Special Requests: ADEQUATE
Special Requests: ADEQUATE

## 2019-05-17 MED ORDER — SODIUM CHLORIDE 0.9 % IV SOLN
INTRAVENOUS | Status: DC | PRN
  Administered 2019-05-17: 10:00:00 500 mL via INTRAVENOUS
  Administered 2019-05-18 – 2019-05-23 (×3): 250 mL via INTRAVENOUS

## 2019-05-17 NOTE — Progress Notes (Addendum)
PROGRESS NOTE    Peter Parsons  [id number] DOB: 1960-07-31 DOA: 02/11/2019 PCP: System, Provider Not In   Brief Narrative:  59 year old male with chronic schizophrenia with frequent suicidal ideations, bipolar disorder with depression, type 2 diabetes mellitus (controlled), CKD stage II, peripheral neuropathy, BPH, hypertension presented with altered mental status.  Prolonged hospital stay awaiting placement in a memory care unit.  Does not have capacity.  Hospital course/significant events 3/31: Prolonged hospital stay of almost 6 weeks awaiting for placement.  Foley discontinued after prolonged use in the hospital (suspected was placed due to bladder outlet obstruction).  Intermittent bladder scan showing urinary retention. 4/1: Foley placed in due to urinary retention noted on repeat bladder scans.  Renal ultrasound done showed distended bladder with Foley not in the bladder. 4/2: Patient found to have gross hematuria with clots and Foley removed.  Urology consulted and a 22 French coud catheter placed.  Patient became septic with hypotension, fever and tachycardia.  Received 5 L normal saline bolus with minimal improvement and transferred to stepdown unit.  Started on Neo-Synephrine through peripheral IV and given 1 unit PRBC for acute blood loss anemia. 4/3: Stable off pressors and transferred back to medical floor.  Subjective: Patient seems stable.  He was sitting upright and trying to eat breakfast.  No complaints.  Assessment & Plan:   Principal Problem:   Enterococcal bacteremia Active Problems:   Paranoid schizophrenia ([place])   Altered mental status   Dementia ([place])   Weakness   Acute metabolic encephalopathy   Protein-calorie malnutrition, severe   Pressure injury of skin   Hypertension   Type 2 diabetes mellitus (HCC)   Dyslipidemia   BPH (benign prostatic hyperplasia)   Normocytic anemia   Thrombocytopenia (HCC)   Acute urinary retention   Urethral trauma  UTI (urinary tract infection)  Severe sepsis with septic shock Secondary to catheter associated UTI. Patient had multiple straight caths done past few days and Foley placed in on 4/1. Transferred to stepdown unit and multiple IV normal saline bolus received.  Required Neo-Synephrine overnight and now stable off pressors.  Sepsis resolved. Blood and urine culture growing Enterococcus faecalis. Antibiotic narrowed to ampicillin according to sensitivity results.  Gross hematuria Secondary to traumatic catheterization.  Foley removed and urology placed in a 20 [place] coud Foley without resistance.  Recommend intermittent irrigation, keep Foley upon discharge and follow-up with urology in [place]. Urine now clear.  Received 1 unit PRBC for drop in H&H.  BPH with urinary retention Continue Foley.  Continue Flomax.  Chronic schizophrenia Continue paliperidone, Klonopin, mirtazapine and risperidone.  History of frequent suicidal ideation.  Does not have capacity.  Outpatient psychiatry follow-up.  Depression with bipolar disorder Continue Lexapro and trazodone.  Acute on chronic kidney disease stage II Likely ATN with sepsis + bladder outlet obstruction.  Monitor with IV fluids.  Type 2 diabetes mellitus, controlled A1c of 4.5.  CBG stable.  Thrombocytopenia Unclear etiology.  Platelet stable.  Discontinued ibuprofen.  Peripheral neuropathy Continue gabapentin  Hypertension/ hyperlipidemia. BP within goal. Off all blood pressure meds. Continue statin.  Dysphagia/dysarthria and left facial droop MRI negative for CVA.  Continue dysphagia diet  Severe protein calorie malnutrition Continue nutrition supplement and Remeron  Failure to thrive PT recommends home health with 24-hour supervision.  Awaiting placement.  Objective: Vitals:   05/17/19 0400 05/17/19 0801 05/17/19 1156 05/17/19 1540  BP: 116/78 126/66 (!) 173/93 (!) 150/86  Pulse: 70 66 100 95  Resp: 17  16 19  18  Temp: 98.9 F (37.2 C) 98.7 F (37.1 C) 98.5 F (36.9 C) 98.4 F (36.9 C)  TempSrc: Oral  Oral Oral  SpO2: 97% 96% 98% 100%  Weight: 84.4 kg     Height:        Intake/Output Summary (Last 24 hours) at 05/17/2019 1545 Last data filed at 05/17/2019 1522 Gross per 24 hour  Intake 980.06 ml  Output 2800 ml  Net -1819.94 ml   Filed Weights   05/04/19 0500 05/16/19 0548 05/17/19 0400  Weight: 74 kg 85.2 kg 84.4 kg    Examination:  General exam: Chronically ill-appearing gentleman, appears calm and comfortable  Respiratory system: Clear to auscultation. Respiratory effort normal. Cardiovascular system: S1 & S2 heard, RRR. No JVD, murmurs, rubs, gallops or clicks. Gastrointestinal system: Soft, nontender, nondistended, bowel sounds positive. Central nervous system: Alert and oriented. No focal neurological deficits.Symmetric 5 x 5 power. Extremities: No edema, no cyanosis, pulses intact and symmetrical. Psychiatry: Judgement and insight appear impaired.   DVT prophylaxis:.  SCDs. Code Status: Full Family Communication: No family at bedside. Disposition Plan: Awaiting memory unit placement.  Consultants:   Psychiatry  Urology  Procedures:  Antimicrobials:  Ampicillin  Data Reviewed: I have personally reviewed following labs and imaging studies  CBC: Recent Labs  Lab 05/10/19 1719 05/10/19 1719 05/14/19 0817 05/14/19 1311 05/14/19 2031 05/15/19 0059 05/15/19 0641 05/16/19 0555 05/17/19 0459  WBC 6.0  --  8.8  --   --  10.6*  --  6.8 5.5  HGB 11.9*   < > 11.9*   < > 8.7* 8.7* 9.7* 9.3* 9.3*  HCT 35.1*   < > 34.8*   < > 24.4* 25.5* 28.2* 27.1* 27.3*  MCV 89.3  --  89.0  --   --  89.2  --  89.1 88.6  PLT 159  --  146*  --   --  102*  --  88* 98*   < > = values in this interval not displayed.   Basic Metabolic Panel: Recent Labs  Lab 05/10/19 1719 05/14/19 1333 05/15/19 0059 05/16/19 0555  NA 142 141  --  142  K 3.8 3.6  --  3.4*  CL 100 101  --   111  CO2 31 29  --  24  GLUCOSE 123* 174*  --  90  BUN 40* 34*  --  39*  CREATININE 1.04 1.48* 1.54* 1.08  CALCIUM 9.4 9.1  --  8.2*   GFR: Estimated Creatinine Clearance: 71.8 mL/min (by C-G formula based on SCr of 1.08 mg/dL). Liver Function Tests: Recent Labs  Lab 05/14/19 1333  AST 22  ALT 14  ALKPHOS 46  BILITOT 0.6  PROT 6.2*  ALBUMIN 3.5   No results for input(s): LIPASE, AMYLASE in the last 168 hours. No results for input(s): AMMONIA in the last 168 hours. Coagulation Profile: Recent Labs  Lab 05/14/19 0817  INR 1.1   Cardiac Enzymes: No results for input(s): CKTOTAL, CKMB, CKMBINDEX, TROPONINI in the last 168 hours. BNP (last 3 results) No results for input(s): PROBNP in the last 8760 hours. HbA1C: No results for input(s): HGBA1C in the last 72 hours. CBG: Recent Labs  Lab 05/14/19 1304  GLUCAP 154*   Lipid Profile: No results for input(s): CHOL, HDL, LDLCALC, TRIG, CHOLHDL, LDLDIRECT in the last 72 hours. Thyroid Function Tests: No results for input(s): TSH, T4TOTAL, FREET4, T3FREE, THYROIDAB in the last 72 hours. Anemia Panel: Recent Labs    05/15/19 0059  VITAMINB12 276  FERRITIN 161  TIBC 168*  IRON 11*   Sepsis Labs: Recent Labs  Lab 05/14/19 1317 05/14/19 1622  LATICACIDVEN 3.8* 1.9    Recent Results (from the past 240 hour(s))  CULTURE, BLOOD (ROUTINE X 2) w Reflex to ID Panel     Status: Abnormal   Collection Time: 05/14/19  1:33 PM   Specimen: BLOOD  Result Value Ref Range Status   Specimen Description   Final    BLOOD BLOOD RIGHT HAND Performed at [facility], [address], [place], [place] [address]    Special Requests   Final    BOTTLES DRAWN AEROBIC AND ANAEROBIC Blood Culture adequate volume Performed at [facility], [address] [place], [place] [address]    Culture  Setup Time   Final    IN BOTH AEROBIC AND ANAEROBIC BOTTLES GRAM POSITIVE COCCI CRITICAL RESULT CALLED TO, READ BACK BY AND  VERIFIED WITH: [place] ON 05/15/19 AT 0149 [facility] Performed at [facility], [address], [place], [place] [address]    Culture (A)  Final    ENTEROCOCCUS FAECALIS SUSCEPTIBILITIES PERFORMED ON PREVIOUS CULTURE WITHIN THE LAST 5 DAYS. Performed at [facility], [place] [address] [place], [place] [address]    Report Status 05/17/2019 FINAL  Final  CULTURE, BLOOD (ROUTINE X 2) w Reflex to ID Panel     Status: Abnormal   Collection Time: 05/14/19  1:35 PM   Specimen: BLOOD  Result Value Ref Range Status   Specimen Description   Final    BLOOD RIGHT ANTECUBITAL Performed at [facility], [place] [address] [place], [place] [address]    Special Requests   Final    BOTTLES DRAWN AEROBIC AND ANAEROBIC Blood Culture adequate volume Performed at [facility], [address] [place], [place] [address]    Culture  Setup Time   Final    Organism ID to follow IN BOTH AEROBIC AND ANAEROBIC BOTTLES GRAM POSITIVE COCCI IN PAIRS CRITICAL VALUE NOTED.  VALUE IS CONSISTENT WITH PREVIOUSLY REPORTED AND CALLED VALUE. Performed at [facility], [address] [place], [place] [address]    Culture ENTEROCOCCUS FAECALIS (A)  Final   Report Status 05/17/2019 FINAL  Final   Organism ID, Bacteria ENTEROCOCCUS FAECALIS  Final      Susceptibility   Enterococcus faecalis - MIC*    AMPICILLIN <=2 SENSITIVE Sensitive     VANCOMYCIN 1 SENSITIVE Sensitive     GENTAMICIN SYNERGY SENSITIVE Sensitive     * ENTEROCOCCUS FAECALIS  Blood Culture ID Panel (Reflexed)     Status: Abnormal   Collection Time: 05/14/19  1:35 PM  Result Value Ref Range Status   Enterococcus species DETECTED (A) NOT DETECTED Final    Comment: CRITICAL RESULT CALLED TO, READ BACK BY AND VERIFIED WITH: SCOTT HALL ON 05/15/19 AT 0604 [facility]    Vancomycin resistance NOT DETECTED NOT DETECTED Final   Listeria monocytogenes NOT DETECTED NOT DETECTED Final   Staphylococcus species NOT DETECTED NOT DETECTED  Final   Staphylococcus aureus (BCID) NOT DETECTED NOT DETECTED Final   Streptococcus species NOT DETECTED NOT DETECTED Final   Streptococcus agalactiae NOT DETECTED NOT DETECTED Final   Streptococcus pneumoniae NOT DETECTED NOT DETECTED Final   Streptococcus pyogenes NOT DETECTED NOT DETECTED Final   Acinetobacter baumannii NOT DETECTED NOT DETECTED Final   Enterobacteriaceae species NOT DETECTED NOT DETECTED Final   Enterobacter cloacae complex NOT DETECTED NOT DETECTED Final   Escherichia coli  NOT DETECTED NOT DETECTED Final   Klebsiella oxytoca NOT DETECTED NOT DETECTED Final   Klebsiella pneumoniae NOT DETECTED NOT DETECTED Final   Proteus species NOT DETECTED NOT DETECTED Final   Serratia marcescens NOT DETECTED NOT DETECTED Final   Haemophilus influenzae NOT DETECTED NOT DETECTED Final   Neisseria meningitidis NOT DETECTED NOT DETECTED Final   Pseudomonas aeruginosa NOT DETECTED NOT DETECTED Final   Candida albicans NOT DETECTED NOT DETECTED Final   Candida glabrata NOT DETECTED NOT DETECTED Final   Candida krusei NOT DETECTED NOT DETECTED Final   Candida parapsilosis NOT DETECTED NOT DETECTED Final   Candida tropicalis NOT DETECTED NOT DETECTED Final    Comment: Performed at [facility], [address] [place], [place] [address]  Urine Culture     Status: Abnormal   Collection Time: 05/14/19  1:51 PM   Specimen: Urine, Catheterized  Result Value Ref Range Status   Specimen Description   Final    URINE, CATHETERIZED Performed at [facility], [address], [place], [place] [address]    Special Requests   Final    Normal Performed at [facility], [address] [place], [place] [address]    Culture >=100,000 COLONIES/mL ENTEROCOCCUS FAECALIS (A)  Final   Report Status 05/16/2019 FINAL  Final   Organism ID, Bacteria ENTEROCOCCUS FAECALIS (A)  Final      Susceptibility   Enterococcus faecalis - MIC*    AMPICILLIN <=2 SENSITIVE Sensitive       NITROFURANTOIN <=16 SENSITIVE Sensitive     VANCOMYCIN 1 SENSITIVE Sensitive     * >=100,000 COLONIES/mL ENTEROCOCCUS FAECALIS  MRSA PCR Screening     Status: None   Collection Time: 05/14/19  9:14 PM   Specimen: Nasal Mucosa; Nasopharyngeal  Result Value Ref Range Status   MRSA by PCR NEGATIVE NEGATIVE Final    Comment:        The GeneXpert MRSA Assay (FDA approved for NASAL specimens only), is one component of a comprehensive MRSA colonization surveillance program. It is not intended to diagnose MRSA infection nor to guide or monitor treatment for MRSA infections. Performed at [facility], [address] [place], [place] [address]   Culture, blood (routine x 2)     Status: None (Preliminary result)   Collection Time: 05/15/19 11:23 AM   Specimen: BLOOD  Result Value Ref Range Status   Specimen Description BLOOD RIGHT [facility]  Final   Special Requests BLOOD Blood Culture adequate volume  Final   Culture   Final    NO GROWTH 2 DAYS Performed at [facility], [address], [place], [place] [address]    Report Status PENDING  Incomplete  Culture, blood (routine x 2)     Status: None (Preliminary result)   Collection Time: 05/15/19 12:47 PM   Specimen: BLOOD  Result Value Ref Range Status   Specimen Description BLOOD BLRA  Final   Special Requests   Final    BOTTLES DRAWN AEROBIC AND ANAEROBIC Blood Culture results may not be optimal due to an excessive volume of blood received in culture bottles   Culture   Final    NO GROWTH 2 DAYS Performed at [facility], [address] [place], [place] [address]    Report Status PENDING  Incomplete     Radiology Studies: No results found.  Scheduled Meds: . atorvastatin  10 mg Oral Daily  . chlorhexidine  15 mL Mouth/Throat BID  . Chlorhexidine Gluconate Cloth  6 each Topical Daily  . escitalopram  10 mg Oral Daily  . feeding supplement (NEPRO CARB STEADY)  237 mL Oral TID BM  . feeding  supplement (PRO-STAT SUGAR FREE 64)  30 mL Oral BID  . gabapentin  300 mg Oral TID  . mirtazapine  45 mg Oral QHS  . multivitamin with minerals  1 tablet Oral Daily  . risperiDONE  3 mg Oral BID  . senna-docusate  1 tablet Oral Daily  . sodium chloride flush  10-40 mL Intracatheter Q12H  . tamsulosin  0.4 mg Oral Daily  . traZODone  150 mg Oral QHS   Continuous Infusions: . sodium chloride 10 mL/hr at 05/17/19 1431  . ampicillin (OMNIPEN) IV 2 g (05/17/19 1431)     LOS: 49 days   Time spent: 40 Minutes.  Lorella Nimrod, MD [facility]  If 7PM-7AM, please contact night-coverage [email or web address]  05/17/2019, 3:45 PM   This record has been created using Systems analyst. Errors have been sought and corrected,but may not always be located. Such creation errors do not reflect on the standard of care.

## 2019-05-17 NOTE — Consult Note (Signed)
NAME: Peter Parsons  DOB: December 09, 1960  MRN: [id number]  Date/Time: 05/17/2019 11:34 AM  REQUESTING PROVIDER: Dr.Amin Subjective:  REASON FOR CONSULT: enterococcus bacteremia and UTI ?H/o from patient and chart- HE is a limited historian Peter Parsons is a 59 y.o. with a history of DM, HTN  Schizophrenia, BPH, ca prostate with LDR brachytherapy in 2014, h/o liver abscess in 2015 in the [place] since Dec 2020 awaiting placement in memory care unit, . Pt says he lived in a half way house in [place] and was running away from it multiple times . So he was brought to [facility] and has been  in the [facility] waiting for placement in a memory unit until Mid feb (2/14 )when he was transferred to medical side due to weakness and was worked up for CVA . He was waiting for memory uit- on 3/24 he had urinary issues and has had in and out cath since then until 3/29 when a foley was placed after midnight by night shift and removed the same day by morning shift The foley catheter was removed on 05/10/19 around 11.38am by lauren. Dr.Dhungel was observing patient off foley. On 3/30 and 3/31  pt did not have any foley and there is a note of straight cath on 3/30. On 4/1 pt was having trouble with retention and as the bladder scan was significant a foley catheter was placed around 4 pm and 600cc of urine drained in the bag. The scan done after that did not see the foley and hence as the foley was malpositioned the next day if was deflated as there was amber colored urine as well- The team noted brisk bleeding from the penis and urologist placed a Coude on 05/14/19 and the same day pt had fever and hypotension and cultures revealed enterococcus in urine and blood and I am seeing the patient for the same Pt is currently on ampicillin Says he is constipated Says he is weak  Past Medical History:  Diagnosis Date  . Diabetes mellitus without complication ([place])   . Hypertension   . 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 Marital status: Single    Spouse name: Not on file  . Number of children: Not on file  . Years of education: Not on file  . Highest education level: Not on file  Occupational History  . Not on file  Tobacco Use  . Smoking status: Former Research scientist (life sciences)  . Smokeless tobacco: Never Used  Substance and Sexual Activity  . Alcohol use: Not Currently  . Drug use: Not Currently  . Sexual activity: Not on file  Other Topics Concern  . Not on file  Social History Narrative  . Not on file   Social Determinants of Health   Financial Resource Strain:   . Difficulty of Paying Living Expenses:   Food Insecurity:   . Worried About Charity fundraiser in the Last Year:   . Arboriculturist in the Last Year:   Transportation Needs:   . Film/video editor (Medical):   Marland Kitchen Lack of Transportation (Non-Medical):   Physical Activity:   . Days of Exercise per Week:   . Minutes of Exercise per Session:   Stress:   . Feeling of Stress :   Social Connections:   .  Frequency of Communication with Friends and Family:   . Frequency of Social Gatherings with Friends and Family:   . Attends Religious Services:   . Active Member of Clubs or Organizations:   . Attends Archivist Meetings:   Marland Kitchen Marital Status:   Intimate Partner Violence:   . Fear of Current or Ex-Partner:   . Emotionally Abused:   Marland Kitchen Physically Abused:   . Sexually Abused:     History reviewed. No pertinent family history. Allergies  Allergen Reactions  . Acetaminophen Other (See Comments)    Unknown Unable to breathe Chest  tightness/SOB     ? Current Facility-Administered Medications  Medication Dose Route Frequency Provider Last Rate Last Admin  . 0.9 %  sodium chloride infusion   Intravenous PRN Lorella Nimrod, MD 10 mL/hr at 05/17/19 1023 500 mL at 05/17/19 1023  . ampicillin (OMNIPEN) 2 g in sodium chloride 0.9 % 100 mL IVPB  2 g Intravenous Q4H Dallie Piles, RPH 300 mL/hr at 05/17/19 1025 2 g at 05/17/19 1025  . atorvastatin (LIPITOR) tablet 10 mg  10 mg Oral Daily Carrie Mew, MD   10 mg at 05/17/19 1026  . chlorhexidine (PERIDEX) 0.12 % solution 15 mL  15 mL Mouth/Throat BID Lavina Hamman, MD   15 mL at 05/17/19 1026  . Chlorhexidine Gluconate Cloth 2 % PADS 6 each  6 each Topical Daily Dhungel, Nishant, MD   6 each at 05/17/19 1026  . clonazePAM (KLONOPIN) tablet 0.25 mg  0.25 mg Oral TID PRN Dixie Dials, MD   0.25 mg at 05/01/19 1603  . dextrose 50 % solution 50 mL  1 ampule Intravenous PRN Lavina Hamman, MD      . escitalopram (LEXAPRO) tablet 10 mg  10 mg Oral Daily Cristofano, Dorene Ar, MD   10 mg at 05/17/19 1026  . feeding supplement (NEPRO CARB STEADY) liquid 237 mL  237 mL Oral TID BM Enzo Bi, MD 0 mL/hr at 05/14/19 0555 237 mL at 05/17/19 1025  . feeding supplement (PRO-STAT SUGAR FREE 64) liquid 30 mL  30 mL Oral BID Dhungel, Nishant, MD   30 mL at 05/17/19 1026  . gabapentin (NEURONTIN) capsule 300 mg  300 mg Oral TID Carrie Mew, MD   300 mg at 05/17/19 1026  . metoprolol tartrate (LOPRESSOR) injection 5 mg  5 mg Intravenous Q12H PRN Wyvonnia Dusky, MD   5 mg at 05/14/19 1011  . mirtazapine (REMERON) tablet 45 mg  45 mg Oral QHS Clapacs, Madie Reno, MD   45 mg at 05/16/19 2218  . multivitamin with minerals tablet 1 tablet  1 tablet Oral Daily Cristofano, Dorene Ar, MD   1 tablet at 05/16/19 1017  . risperiDONE (RISPERDAL) tablet 3 mg  3 mg Oral BID Carrie Mew, MD   3 mg at 05/17/19 1026  . senna-docusate (Senokot-S) tablet 1 tablet  1 tablet Oral QHS PRN Mansy, Arvella Merles, MD   1 tablet at 04/15/19 2229  . senna-docusate (Senokot-S) tablet 1 tablet  1 tablet Oral Daily Enzo Bi, MD   1 tablet at 05/17/19 1026  . sodium chloride flush (NS) 0.9 %  injection 10-40 mL  10-40 mL Intracatheter Q12H Dhungel, Nishant, MD   10 mL at 05/17/19 1027  . sodium chloride flush (NS) 0.9 % injection 10-40 mL  10-40 mL Intracatheter PRN Dhungel, Nishant, MD      . tamsulosin (FLOMAX) capsule 0.4 mg  0.4 mg Oral Daily  Fritzi Mandes, MD   0.4 mg at 05/17/19 1026  . traZODone (DESYREL) tablet 150 mg  150 mg Oral QHS Carrie Mew, MD   150 mg at 05/16/19 2218     Abtx:  Anti-infectives (From admission, onward)   Start     Dose/Rate Route Frequency Ordered Stop   05/16/19 0800  ampicillin (OMNIPEN) 2 g in sodium chloride 0.9 % 100 mL IVPB     2 g 300 mL/hr over 20 Minutes Intravenous Every 4 hours 05/15/19 1244     05/15/19 2200  ceFEPIme (MAXIPIME) 2 g in sodium chloride 0.9 % 100 mL IVPB  Status:  Discontinued     2 g 200 mL/hr over 30 Minutes Intravenous Every 12 hours 05/15/19 0724 05/15/19 1115   05/15/19 2200  ampicillin (OMNIPEN) 2 g in sodium chloride 0.9 % 100 mL IVPB  Status:  Discontinued     2 g 300 mL/hr over 20 Minutes Intravenous Every 8 hours 05/15/19 1115 05/15/19 1244   05/15/19 1500  vancomycin (VANCOREADY) IVPB 1750 mg/350 mL  Status:  Discontinued     1,750 mg 175 mL/hr over 120 Minutes Intravenous Every 24 hours 05/14/19 1340 05/15/19 0737   05/15/19 1000  vancomycin (VANCOREADY) IVPB 1250 mg/250 mL  Status:  Discontinued     1,250 mg 166.7 mL/hr over 90 Minutes Intravenous Every 24 hours 05/15/19 0737 05/15/19 1115   05/14/19 1400  ceFEPIme (MAXIPIME) 2 g in sodium chloride 0.9 % 100 mL IVPB  Status:  Discontinued     2 g 200 mL/hr over 30 Minutes Intravenous Every 8 hours 05/14/19 1339 05/15/19 0724   05/14/19 1400  vancomycin (VANCOCIN) 2,000 mg in sodium chloride 0.9 % 500 mL IVPB     2,000 mg 250 mL/hr over 120 Minutes Intravenous  Once 05/14/19 1339 05/14/19 1709      REVIEW OF SYSTEMS:  Const: negative fever, negative chills, negative weight loss Eyes: negative diplopia or visual changes, negative eye pain ENT:  negative coryza, negative sore throat Resp: negative cough, hemoptysis, dyspnea Cards: negative for chest pain, palpitations, lower extremity edema GU: as above GI: constipation, poor appetitie Skin: negative for rash and pruritus Heme: negative for easy bruising and gum/nose bleeding MS: muscle weakness Neurolo:negative for headaches, dizziness, vertigo, memory problems  Psych: schizoaffective disorder Endocrine: diabetes Allergy/Immunology- Tylenol Objective:  VITALS:  BP 126/66 (BP Location: Left Leg)   Pulse 66   Temp 98.7 F (37.1 C)   Resp 16   Ht 5\' 9"  (1.753 m)   Wt 84.4 kg   SpO2 96%   BMI 27.48 kg/m  PHYSICAL EXAM:  General: Alert, cooperative, no distress, orineted in person, place, year, month, dateHead: Normocephalic, without obvious abnormality, atraumatic. Eyes: Conjunctivae clear, anicteric sclerae. Pupils are equal ENT Nares normal. No drainage or sinus tenderness. Poor dentition Neck: Supple,  Lungs: b/la ir entry Heart: s1s2 Abdomen: Soft, non-tender,not distended. Bowel sounds normal. No masses Foley  Extremities: did not examine in detail Skin: No rashes or lesions. Or bruising Lymph: Cervical, supraclavicular normal. Neurologic:did not examine in detail Pertinent Labs Lab Results CBC    Component Value Date/Time   WBC 5.5 05/17/2019 0459   RBC 3.08 (L) 05/17/2019 0459   HGB 9.3 (L) 05/17/2019 0459   HCT 27.3 (L) 05/17/2019 0459   PLT 98 (L) 05/17/2019 0459   MCV 88.6 05/17/2019 0459   MCH 30.2 05/17/2019 0459   MCHC 34.1 05/17/2019 0459   RDW 14.1 05/17/2019 0459   LYMPHSABS 2.2 03/30/2019  0404   MONOABS 0.4 03/30/2019 0404   EOSABS 0.1 03/30/2019 0404   BASOSABS 0.0 03/30/2019 0404    CMP Latest Ref Rng & Units 05/16/2019 05/15/2019 05/14/2019  Glucose 70 - 99 mg/dL 90 - 174(H)  BUN 8 - 23 mg/dL 39(H) - 34(H)  Creatinine 0.61 - 1.24 mg/dL 1.08 1.54(H) 1.48(H)  Sodium 135 - 145 mmol/L 142 - 141  Potassium 3.5 - 5.1 mmol/L 3.4(L) - 3.6   Chloride 98 - 111 mmol/L 111 - 101  CO2 22 - 32 mmol/L 24 - 29  Calcium 8.9 - 10.3 mg/dL 8.2(L) - 9.1  Total Protein 6.5 - 8.1 g/dL - - 6.2(L)  Total Bilirubin 0.3 - 1.2 mg/dL - - 0.6  Alkaline Phos 38 - 126 U/L - - 46  AST 15 - 41 U/L - - 22  ALT 0 - 44 U/L - - 14      Microbiology: Recent Results (from the past 240 hour(s))  CULTURE, BLOOD (ROUTINE X 2) w Reflex to ID Panel     Status: Abnormal   Collection Time: 05/14/19  1:33 PM   Specimen: BLOOD  Result Value Ref Range Status   Specimen Description   Final    BLOOD BLOOD RIGHT HAND Performed at [facility], [address] [place], [place] [address]    Special Requests   Final    BOTTLES DRAWN AEROBIC AND ANAEROBIC Blood Culture adequate volume Performed at [facility], [address] [place], [place] [address]    Culture  Setup Time   Final    IN BOTH AEROBIC AND ANAEROBIC BOTTLES GRAM POSITIVE COCCI CRITICAL RESULT CALLED TO, READ BACK BY AND VERIFIED WITH: [place] ON 05/15/19 AT 0149 [facility] Performed at [facility], [address] [place], [place] [address]    Culture (A)  Final    ENTEROCOCCUS FAECALIS SUSCEPTIBILITIES PERFORMED ON PREVIOUS CULTURE WITHIN THE LAST 5 DAYS. Performed at [facility], [place] [address], [place], [place] [address]    Report Status 05/17/2019 FINAL  Final  CULTURE, BLOOD (ROUTINE X 2) w Reflex to ID Panel     Status: Abnormal   Collection Time: 05/14/19  1:35 PM   Specimen: BLOOD  Result Value Ref Range Status   Specimen Description   Final    BLOOD RIGHT ANTECUBITAL Performed at [facility], [place] [address] [place], [place] [address]    Special Requests   Final    BOTTLES DRAWN AEROBIC AND ANAEROBIC Blood Culture adequate volume Performed at [facility], [address] [place], [place] [address]    Culture  Setup Time   Final    Organism ID to follow IN BOTH AEROBIC AND ANAEROBIC BOTTLES GRAM POSITIVE COCCI IN  PAIRS CRITICAL VALUE NOTED.  VALUE IS CONSISTENT WITH PREVIOUSLY REPORTED AND CALLED VALUE. Performed at [facility], [address] [place], [place] [address]    Culture ENTEROCOCCUS FAECALIS (A)  Final   Report Status 05/17/2019 FINAL  Final   Organism ID, Bacteria ENTEROCOCCUS FAECALIS  Final      Susceptibility   Enterococcus faecalis - MIC*    AMPICILLIN <=2 SENSITIVE Sensitive     VANCOMYCIN 1 SENSITIVE Sensitive     GENTAMICIN SYNERGY SENSITIVE Sensitive     * ENTEROCOCCUS FAECALIS  Blood Culture ID Panel (Reflexed)     Status: Abnormal   Collection Time: 05/14/19  1:35 PM  Result Value Ref Range Status   Enterococcus species DETECTED (A)  NOT DETECTED Final    Comment: CRITICAL RESULT CALLED TO, READ BACK BY AND VERIFIED WITH: SCOTT [place] ON 05/15/19 AT 0604 [facility]    Vancomycin resistance NOT DETECTED NOT DETECTED Final   Listeria monocytogenes NOT DETECTED NOT DETECTED Final   Staphylococcus species NOT DETECTED NOT DETECTED Final   Staphylococcus aureus (BCID) NOT DETECTED NOT DETECTED Final   Streptococcus species NOT DETECTED NOT DETECTED Final   Streptococcus agalactiae NOT DETECTED NOT DETECTED Final   Streptococcus pneumoniae NOT DETECTED NOT DETECTED Final   Streptococcus pyogenes NOT DETECTED NOT DETECTED Final   Acinetobacter baumannii NOT DETECTED NOT DETECTED Final   Enterobacteriaceae species NOT DETECTED NOT DETECTED Final   Enterobacter cloacae complex NOT DETECTED NOT DETECTED Final   Escherichia coli NOT DETECTED NOT DETECTED Final   Klebsiella oxytoca NOT DETECTED NOT DETECTED Final   Klebsiella pneumoniae NOT DETECTED NOT DETECTED Final   Proteus species NOT DETECTED NOT DETECTED Final   Serratia marcescens NOT DETECTED NOT DETECTED Final   Haemophilus influenzae NOT DETECTED NOT DETECTED Final   Neisseria meningitidis NOT DETECTED NOT DETECTED Final   Pseudomonas aeruginosa NOT DETECTED NOT DETECTED Final   Candida albicans NOT DETECTED NOT  DETECTED Final   Candida glabrata NOT DETECTED NOT DETECTED Final   Candida krusei NOT DETECTED NOT DETECTED Final   Candida parapsilosis NOT DETECTED NOT DETECTED Final   Candida tropicalis NOT DETECTED NOT DETECTED Final    Comment: Performed at [facility], [address] [place], [place] [address]  Urine Culture     Status: Abnormal   Collection Time: 05/14/19  1:51 PM   Specimen: Urine, Catheterized  Result Value Ref Range Status   Specimen Description   Final    URINE, CATHETERIZED Performed at [facility], [address] [place], [place] [address]    Special Requests   Final    Normal Performed at [facility], [address] [place], [place] [address]    Culture >=100,000 COLONIES/mL ENTEROCOCCUS FAECALIS (A)  Final   Report Status 05/16/2019 FINAL  Final   Organism ID, Bacteria ENTEROCOCCUS FAECALIS (A)  Final      Susceptibility   Enterococcus faecalis - MIC*    AMPICILLIN <=2 SENSITIVE Sensitive     NITROFURANTOIN <=16 SENSITIVE Sensitive     VANCOMYCIN 1 SENSITIVE Sensitive     * >=100,000 COLONIES/mL ENTEROCOCCUS FAECALIS  MRSA PCR Screening     Status: None   Collection Time: 05/14/19  9:14 PM   Specimen: Nasal Mucosa; Nasopharyngeal  Result Value Ref Range Status   MRSA by PCR NEGATIVE NEGATIVE Final    Comment:        The GeneXpert MRSA Assay (FDA approved for NASAL specimens only), is one component of a comprehensive MRSA colonization surveillance program. It is not intended to diagnose MRSA infection nor to guide or monitor treatment for MRSA infections. Performed at [facility], [address] [place], [place] [address]   Culture, blood (routine x 2)     Status: None (Preliminary result)   Collection Time: 05/15/19 11:23 AM   Specimen: BLOOD  Result Value Ref Range Status   Specimen Description BLOOD RIGHT [facility]  Final   Special Requests BLOOD Blood Culture adequate volume  Final   Culture   Final    NO  GROWTH 2 DAYS Performed at [facility], [address], [place], [place] [address]    Report Status PENDING  Incomplete  Culture, blood (routine x 2)  Status: None (Preliminary result)   Collection Time: 05/15/19 12:47 PM   Specimen: BLOOD  Result Value Ref Range Status   Specimen Description BLOOD BLRA  Final   Special Requests   Final    BOTTLES DRAWN AEROBIC AND ANAEROBIC Blood Culture results may not be optimal due to an excessive volume of blood received in culture bottles   Culture   Final    NO GROWTH 2 DAYS Performed at [facility], [address] [place], [place] [address]    Report Status PENDING  Incomplete    IMAGING RESULTS: 05/15/19 CT scan reviewed Multiple gallstones without complicating factors. Bibasilar atelectasis. Chronic distal left ureteral [place]. Foley catheter in the bladder. No hydronephrosis I have personally reviewed the films ? Impression/Recommendation ? ?Enterococcus faecalis bacteremia and UTI secondary to sub acute urinary retention leading to traumatic catheterization Currently has coude And on ampicillin No suspicion for endocarditis Will give IV ampicillin for 7-10 days  BPH on tamsulosin  Prostate ca s/p seeds  Schizophrenia on klonopin, gabapentin, risperidone, remeron lexapro  Constipation Both Urinary retention and constipation due to anticholinergic effects of hsi psych meds and immobility- pt should have dialy PT and he should be made to walk.    ? ___________________________________________________ Discussed with patient

## 2019-05-17 NOTE — Progress Notes (Signed)
Occupational Therapy Treatment Patient Details Name: Peter Parsons MRN: [id number] DOB: [birth year] Today's Date: 05/17/2019    History of present illness 59 y.o. male initially presenting to hospital 02/11/19 with Involuntary commitment from group home d/t wandering off into woods multiple times (found near water); pt reporting suicidal thoughts of drowning himself.  Pt tripped on his own feet and hit chin on chair on morning of 2/4 (pt got back up and walked to restroom independently).  Unable to return to group home d/t safety concerns.  Per chart review pt scored 19/30 on [facility] cognitive assessment with psychiatry indicative of moderate cognitive impairment (consistent with pt's diagnosis of dementia).  Pt was in [place] in ED for about 44 days prior to being admitted to hospital 2/13 for generalized weakness, dysphagia, and slurred speech.  MRI negative for acute CVA.  PMH includes DM, htn, schizophrenia.   OT comments  Patient seated in recliner and agreeable to therapy upon entry.  Demonstrated improvement in appropriate conversation and following directions this date.  Completed simple grooming while seated with SPV/set up; continues to require cues for task initiation.  Targeted functional transfers/strengthening this date with sit<>stands from recliner and education on body mechanics, safety, sequencing and self pacing.  Demonstrated improved carryover with education.  Patient would benefit from continued occupational therapy services to address overall engagement in meaningful occupations and to address deficits outlined in evaluation.  Based on today's performance, follow up recommendation remains appropriate.       Follow Up Recommendations  Home health OT;Supervision/Assistance - 24 hour    Equipment Recommendations  3 in 1 bedside commode    Recommendations for Other Services      Precautions / Restrictions Precautions Precautions: Fall Precaution Comments: Suicide precautions;  aspiration precautions; monitor HR Restrictions Weight Bearing Restrictions: No       Mobility Bed Mobility               General bed mobility comments: Patient in recliner upon entry  Transfers Overall transfer level: Needs assistance Equipment used: Rolling walker (2 wheeled) Transfers: Sit to/from Stand Sit to Stand: Min assist;Min guard         General transfer comment: Initially requiring MIN A for sit<>stand with delayed carryover of education.  Transitioned to [place] with extra time with continued cuing for body mechanics.    Balance Overall balance assessment: Needs assistance                                         ADL either performed or assessed with clinical judgement   ADL Overall ADL's : Needs assistance/impaired     Grooming: Wash/dry hands;Wash/dry face;Sitting;Set up;Supervision/safety Grooming Details (indicate cue type and reason): Performed seated in recliner this date.                               General ADL Comments: Continues to require extra time for processing and follow through of directions.     Vision Patient Visual Report: No change from baseline     Perception     Praxis      Cognition Arousal/Alertness: Awake/alert Behavior During Therapy: WFL for tasks assessed/performed Overall Cognitive Status: History of cognitive impairments - at baseline  General Comments: Demonstrated improved conversation.  Patient able to appropriate respond to questions, even if only "yes" or "no".  Patient able to recognize he is waiting for placement at different facility.  Voices he does not want to return to group home.        Exercises Other Exercises Other Exercises: Performed seated grooming tasks with SPV/set up.  Extra time due to delayed processing and poor task initiation. Other Exercises: Engaging in functional sit<>stands to improve general strength and  balance needed to safely perform toileting and LB dressing.  Began with MIN A and transitioned to CGA/MIN A.   Shoulder Instructions       General Comments HR at 104 during seated tasks elevating to ~130 with functional transfers.  Pt with no complaints of dizziness or pain.    Pertinent Vitals/ Pain       Pain Assessment: No/denies pain  Home Living                                          Prior Functioning/Environment              Frequency  Min 1X/week        Progress Toward Goals  OT Goals(current goals can now be found in the care plan section)  Progress towards OT goals: Progressing toward goals     Plan Discharge plan remains appropriate;Frequency remains appropriate    Co-evaluation                 AM-PAC OT "6 Clicks" Daily Activity     Outcome Measure   Help from another person eating meals?: None(Recommend SPV d/t aspiration precautions) Help from another person taking care of personal grooming?: None(if sitting. only VCs for initiation.) Help from another person toileting, which includes using toliet, bedpan, or urinal?: A Little Help from another person bathing (including washing, rinsing, drying)?: A Lot Help from another person to put on and taking off regular upper body clothing?: A Little Help from another person to put on and taking off regular lower body clothing?: A Lot 6 Click Score: 18    End of Session Equipment Utilized During Treatment: Gait belt;Rolling walker  OT Visit Diagnosis: Unsteadiness on feet (R26.81);Muscle weakness (generalized) (M62.81)   Activity Tolerance Patient tolerated treatment well   Patient Left in chair;with call bell/phone within reach;with chair alarm set   Nurse Communication Other (comment)(Discussed what liquids patient may have)        Time: [id number] OT Time Calculation (min): 17 min  Charges: OT General Charges $OT Visit: 1 Visit OT Treatments $Self Care/Home  Management : 8-22 mins $Therapeutic Activity: 8-22 mins  Baldomero Lamy, MS, OTR/L 05/17/19, 2:43 PM

## 2019-05-18 DIAGNOSIS — B952 Enterococcus as the cause of diseases classified elsewhere: Secondary | ICD-10-CM | POA: Diagnosis not present

## 2019-05-18 DIAGNOSIS — R7881 Bacteremia: Secondary | ICD-10-CM | POA: Diagnosis not present

## 2019-05-18 LAB — FOLATE RBC
Folate, Hemolysate: 388 ng/mL
Folate, RBC: 1552 ng/mL (ref >498)
Hematocrit: 25 % — ABNORMAL LOW (ref 37.5–51.0)

## 2019-05-18 LAB — GLUCOSE, CAPILLARY: Glucose-Capillary: 79 mg/dL (ref 70–99)

## 2019-05-18 MED ORDER — SENNOSIDES-DOCUSATE SODIUM 8.6-50 MG PO TABS
2.0000 | ORAL_TABLET | Freq: Two times a day (BID) | ORAL | Status: DC
  Administered 2019-05-18 – 2019-07-11 (×89): 2 via ORAL
  Filled 2019-05-18 (×99): qty 2

## 2019-05-18 NOTE — Progress Notes (Signed)
PROGRESS NOTE    Peter Parsons  [id number] DOB: 15-Dec-1960 DOA: 02/11/2019 PCP: System, Provider Not In   Brief Narrative:  59 year old male with chronic schizophrenia with frequent suicidal ideations, bipolar disorder with depression, type 2 diabetes mellitus (controlled), CKD stage II, peripheral neuropathy, BPH, hypertension presented with altered mental status.  Prolonged hospital stay awaiting placement in a memory care unit.  Does not have capacity.  Hospital course/significant events 3/31: Prolonged hospital stay of almost 6 weeks awaiting for placement.  Foley discontinued after prolonged use in the hospital (suspected was placed due to bladder outlet obstruction).  Intermittent bladder scan showing urinary retention. 4/1: Foley placed in due to urinary retention noted on repeat bladder scans.  Renal ultrasound done showed distended bladder with Foley not in the bladder. 4/2: Patient found to have gross hematuria with clots and Foley removed.  Urology consulted and a 40 French coud catheter placed.  Patient became septic with hypotension, fever and tachycardia.  Received 5 L normal saline bolus with minimal improvement and transferred to stepdown unit.  Started on Neo-Synephrine through peripheral IV and given 1 unit PRBC for acute blood loss anemia. 4/3: Stable off pressors and transferred back to medical floor.  Subjective: Patient seems stable.  He was complaining of constipation.  Denies any abdominal pain, nausea or vomiting.  Assessment & Plan:   Principal Problem:   Enterococcal bacteremia Active Problems:   Paranoid schizophrenia ([place])   Altered mental status   Dementia ([place])   Weakness   Acute metabolic encephalopathy   Protein-calorie malnutrition, severe   Pressure injury of skin   Hypertension   Type 2 diabetes mellitus (HCC)   Dyslipidemia   BPH (benign prostatic hyperplasia)   Normocytic anemia   Thrombocytopenia (HCC)   Acute urinary retention  Urethral trauma   UTI (urinary tract infection)  Severe sepsis with septic shock.  Resolved. Secondary to catheter associated UTI. Patient had multiple straight caths done past few days and Foley placed in on 4/1. Transferred to stepdown unit and multiple IV normal saline bolus received.  Required Neo-Synephrine overnight and now stable off pressors.  Sepsis resolved. Blood and urine culture growing Enterococcus faecalis. Antibiotic narrowed to ampicillin according to sensitivity results.  Constipation. -We will try soapsuds enema. -Regular bowel regimen.  Gross hematuria Secondary to traumatic catheterization.  Foley removed and urology placed in a 20 [place] coud Foley without resistance.  Recommend intermittent irrigation, keep Foley upon discharge and follow-up with urology in [place]. Urine now clear.  Received 1 unit PRBC for drop in H&H.  BPH with urinary retention Continue Foley.  Continue Flomax.  Chronic schizophrenia Continue paliperidone, Klonopin, mirtazapine and risperidone.  History of frequent suicidal ideation.  Does not have capacity.  Outpatient psychiatry follow-up.  Depression with bipolar disorder Continue Lexapro and trazodone.  Acute on chronic kidney disease stage II Likely ATN with sepsis + bladder outlet obstruction.  Monitor with IV fluids.  Type 2 diabetes mellitus, controlled A1c of 4.5.  CBG stable.  Thrombocytopenia Unclear etiology.  Platelet stable.  Discontinued ibuprofen.  Peripheral neuropathy Continue gabapentin  Hypertension/ hyperlipidemia. BP within goal. Off all blood pressure meds. Continue statin.  Dysphagia/dysarthria and left facial droop MRI negative for CVA.  Continue dysphagia diet  Severe protein calorie malnutrition Continue nutrition supplement and Remeron  Failure to thrive PT recommends home health with 24-hour supervision.  Awaiting placement.  Objective: Vitals:   05/17/19 1941 05/18/19 0532  05/18/19 0740 05/18/19 1159  BP: 129/75 112/71 (!) 144/77 Marland Kitchen)  148/76  Pulse: 77 (!) 51 (!) 58 66  Resp:   18 16  Temp: 98.4 F (36.9 C) 98.5 F (36.9 C) 98 F (36.7 C) 98.1 F (36.7 C)  TempSrc: Oral Oral Oral Oral  SpO2: 97% 97% 96% 98%  Weight:  81.6 kg    Height:        Intake/Output Summary (Last 24 hours) at 05/18/2019 1621 Last data filed at 05/18/2019 1608 Gross per 24 hour  Intake 935.33 ml  Output 3700 ml  Net -2764.67 ml   Filed Weights   05/16/19 0548 05/17/19 0400 05/18/19 0532  Weight: 85.2 kg 84.4 kg 81.6 kg    Examination:  General exam: Chronically ill-appearing gentleman, appears calm and comfortable  Respiratory system: Clear to auscultation. Respiratory effort normal. Cardiovascular system: S1 & S2 heard, RRR. No JVD, murmurs, rubs, gallops or clicks. Gastrointestinal system: Soft, nontender, nondistended, bowel sounds positive. Central nervous system: Alert and oriented. No focal neurological deficits.Symmetric 5 x 5 power. Extremities: No edema, no cyanosis, pulses intact and symmetrical. Psychiatry: Judgement and insight appear impaired.   DVT prophylaxis:.  SCDs. Code Status: Full Family Communication: No family at bedside. Disposition Plan: Awaiting memory unit placement.  Consultants:   Psychiatry  Urology  Procedures:  Antimicrobials:  Ampicillin  Data Reviewed: I have personally reviewed following labs and imaging studies  CBC: Recent Labs  Lab 05/14/19 0817 05/14/19 1311 05/14/19 2031 05/15/19 0059 05/15/19 0641 05/16/19 0555 05/17/19 0459  WBC 8.8  --   --  10.6*  --  6.8 5.5  HGB 11.9*   < > 8.7* 8.7* 9.7* 9.3* 9.3*  HCT 34.8*   < > 24.4* 25.5*  25.0* 28.2* 27.1* 27.3*  MCV 89.0  --   --  89.2  --  89.1 88.6  PLT 146*  --   --  102*  --  88* 98*   < > = values in this interval not displayed.   Basic Metabolic Panel: Recent Labs  Lab 05/14/19 1333 05/15/19 0059 05/16/19 0555  NA 141  --  142  K 3.6  --  3.4*  CL  101  --  111  CO2 29  --  24  GLUCOSE 174*  --  90  BUN 34*  --  39*  CREATININE 1.48* 1.54* 1.08  CALCIUM 9.1  --  8.2*   GFR: Estimated Creatinine Clearance: 71.8 mL/min (by C-G formula based on SCr of 1.08 mg/dL). Liver Function Tests: Recent Labs  Lab 05/14/19 1333  AST 22  ALT 14  ALKPHOS 46  BILITOT 0.6  PROT 6.2*  ALBUMIN 3.5   No results for input(s): LIPASE, AMYLASE in the last 168 hours. No results for input(s): AMMONIA in the last 168 hours. Coagulation Profile: Recent Labs  Lab 05/14/19 0817  INR 1.1   Cardiac Enzymes: No results for input(s): CKTOTAL, CKMB, CKMBINDEX, TROPONINI in the last 168 hours. BNP (last 3 results) No results for input(s): PROBNP in the last 8760 hours. HbA1C: No results for input(s): HGBA1C in the last 72 hours. CBG: Recent Labs  Lab 05/14/19 1304  GLUCAP 154*   Lipid Profile: No results for input(s): CHOL, HDL, LDLCALC, TRIG, CHOLHDL, LDLDIRECT in the last 72 hours. Thyroid Function Tests: No results for input(s): TSH, T4TOTAL, FREET4, T3FREE, THYROIDAB in the last 72 hours. Anemia Panel: No results for input(s): VITAMINB12, FOLATE, FERRITIN, TIBC, IRON, RETICCTPCT in the last 72 hours. Sepsis Labs: Recent Labs  Lab 05/14/19 1317 05/14/19 1622  LATICACIDVEN 3.8* 1.9  Recent Results (from the past 240 hour(s))  CULTURE, BLOOD (ROUTINE X 2) w Reflex to ID Panel     Status: Abnormal   Collection Time: 05/14/19  1:33 PM   Specimen: BLOOD  Result Value Ref Range Status   Specimen Description   Final    BLOOD BLOOD RIGHT HAND Performed at [facility], [address] [place], [place] [address]    Special Requests   Final    BOTTLES DRAWN AEROBIC AND ANAEROBIC Blood Culture adequate volume Performed at [facility], [address] [place], [place] [address]    Culture  Setup Time   Final    IN BOTH AEROBIC AND ANAEROBIC BOTTLES GRAM POSITIVE COCCI CRITICAL RESULT CALLED TO, READ BACK BY AND  VERIFIED WITH: [place] ON 05/15/19 AT 0149 [facility] Performed at [facility], [address] [place], [place] [address]    Culture (A)  Final    ENTEROCOCCUS FAECALIS SUSCEPTIBILITIES PERFORMED ON PREVIOUS CULTURE WITHIN THE LAST 5 DAYS. Performed at [facility], [place] [address] [place], [place] [address]    Report Status 05/17/2019 FINAL  Final  CULTURE, BLOOD (ROUTINE X 2) w Reflex to ID Panel     Status: Abnormal   Collection Time: 05/14/19  1:35 PM   Specimen: BLOOD  Result Value Ref Range Status   Specimen Description   Final    BLOOD RIGHT ANTECUBITAL Performed at [facility], [place] [address] [place], [place] [address]    Special Requests   Final    BOTTLES DRAWN AEROBIC AND ANAEROBIC Blood Culture adequate volume Performed at [facility], [address] [place], [place] [address]    Culture  Setup Time   Final    Organism ID to follow IN BOTH AEROBIC AND ANAEROBIC BOTTLES GRAM POSITIVE COCCI IN PAIRS CRITICAL VALUE NOTED.  VALUE IS CONSISTENT WITH PREVIOUSLY REPORTED AND CALLED VALUE. Performed at [facility], [address] [place], [place] [address]    Culture ENTEROCOCCUS FAECALIS (A)  Final   Report Status 05/17/2019 FINAL  Final   Organism ID, Bacteria ENTEROCOCCUS FAECALIS  Final      Susceptibility   Enterococcus faecalis - MIC*    AMPICILLIN <=2 SENSITIVE Sensitive     VANCOMYCIN 1 SENSITIVE Sensitive     GENTAMICIN SYNERGY SENSITIVE Sensitive     * ENTEROCOCCUS FAECALIS  Blood Culture ID Panel (Reflexed)     Status: Abnormal   Collection Time: 05/14/19  1:35 PM  Result Value Ref Range Status   Enterococcus species DETECTED (A) NOT DETECTED Final    Comment: CRITICAL RESULT CALLED TO, READ BACK BY AND VERIFIED WITH: SCOTT HALL ON 05/15/19 AT 0604 [facility]    Vancomycin resistance NOT DETECTED NOT DETECTED Final   Listeria monocytogenes NOT DETECTED NOT DETECTED Final   Staphylococcus species NOT DETECTED NOT DETECTED  Final   Staphylococcus aureus (BCID) NOT DETECTED NOT DETECTED Final   Streptococcus species NOT DETECTED NOT DETECTED Final   Streptococcus agalactiae NOT DETECTED NOT DETECTED Final   Streptococcus pneumoniae NOT DETECTED NOT DETECTED Final   Streptococcus pyogenes NOT DETECTED NOT DETECTED Final   Acinetobacter baumannii NOT DETECTED NOT DETECTED Final   Enterobacteriaceae species NOT DETECTED NOT DETECTED Final   Enterobacter cloacae complex NOT DETECTED NOT DETECTED Final   Escherichia coli NOT DETECTED NOT DETECTED Final   Klebsiella oxytoca NOT DETECTED NOT DETECTED Final   Klebsiella pneumoniae NOT DETECTED NOT DETECTED Final   Proteus species  NOT DETECTED NOT DETECTED Final   Serratia marcescens NOT DETECTED NOT DETECTED Final   Haemophilus influenzae NOT DETECTED NOT DETECTED Final   Neisseria meningitidis NOT DETECTED NOT DETECTED Final   Pseudomonas aeruginosa NOT DETECTED NOT DETECTED Final   Candida albicans NOT DETECTED NOT DETECTED Final   Candida glabrata NOT DETECTED NOT DETECTED Final   Candida krusei NOT DETECTED NOT DETECTED Final   Candida parapsilosis NOT DETECTED NOT DETECTED Final   Candida tropicalis NOT DETECTED NOT DETECTED Final    Comment: Performed at [facility], [address], [place], [place] [address]  Urine Culture     Status: Abnormal   Collection Time: 05/14/19  1:51 PM   Specimen: Urine, Catheterized  Result Value Ref Range Status   Specimen Description   Final    URINE, CATHETERIZED Performed at [facility], [address] [place], [place] [address]    Special Requests   Final    Normal Performed at [facility], [address] [place], [place] [address]    Culture >=100,000 COLONIES/mL ENTEROCOCCUS FAECALIS (A)  Final   Report Status 05/16/2019 FINAL  Final   Organism ID, Bacteria ENTEROCOCCUS FAECALIS (A)  Final      Susceptibility   Enterococcus faecalis - MIC*    AMPICILLIN <=2 SENSITIVE Sensitive       NITROFURANTOIN <=16 SENSITIVE Sensitive     VANCOMYCIN 1 SENSITIVE Sensitive     * >=100,000 COLONIES/mL ENTEROCOCCUS FAECALIS  MRSA PCR Screening     Status: None   Collection Time: 05/14/19  9:14 PM   Specimen: Nasal Mucosa; Nasopharyngeal  Result Value Ref Range Status   MRSA by PCR NEGATIVE NEGATIVE Final    Comment:        The GeneXpert MRSA Assay (FDA approved for NASAL specimens only), is one component of a comprehensive MRSA colonization surveillance program. It is not intended to diagnose MRSA infection nor to guide or monitor treatment for MRSA infections. Performed at [facility], [address] [place], [place] [address]   Culture, blood (routine x 2)     Status: None (Preliminary result)   Collection Time: 05/15/19 11:23 AM   Specimen: BLOOD  Result Value Ref Range Status   Specimen Description BLOOD RIGHT [facility]  Final   Special Requests BLOOD Blood Culture adequate volume  Final   Culture   Final    NO GROWTH 3 DAYS Performed at [facility], [address] [place], [place] [address]    Report Status PENDING  Incomplete  Culture, blood (routine x 2)     Status: None (Preliminary result)   Collection Time: 05/15/19 12:47 PM   Specimen: BLOOD  Result Value Ref Range Status   Specimen Description BLOOD BLRA  Final   Special Requests   Final    BOTTLES DRAWN AEROBIC AND ANAEROBIC Blood Culture results may not be optimal due to an excessive volume of blood received in culture bottles   Culture   Final    NO GROWTH 3 DAYS Performed at [facility], [address], [place], [place] [address]    Report Status PENDING  Incomplete     Radiology Studies: No results found.  Scheduled Meds: . atorvastatin  10 mg Oral Daily  . chlorhexidine  15 mL Mouth/Throat BID  . Chlorhexidine Gluconate Cloth  6 each Topical Daily  . escitalopram  10 mg Oral Daily  . feeding supplement (NEPRO CARB STEADY)  237 mL Oral TID BM  .  feeding  supplement (PRO-STAT SUGAR FREE 64)  30 mL Oral BID  . gabapentin  300 mg Oral TID  . mirtazapine  45 mg Oral QHS  . multivitamin with minerals  1 tablet Oral Daily  . risperiDONE  3 mg Oral BID  . senna-docusate  1 tablet Oral Daily  . sodium chloride flush  10-40 mL Intracatheter Q12H  . tamsulosin  0.4 mg Oral Daily  . traZODone  150 mg Oral QHS   Continuous Infusions: . sodium chloride 10 mL/hr at 05/18/19 1606  . ampicillin (OMNIPEN) IV 2 g (05/18/19 1608)     LOS: 50 days   Time spent: 35 Minutes.  Lorella Nimrod, MD [facility]  If 7PM-7AM, please contact night-coverage [email or web address]  05/18/2019, 4:21 PM   This record has been created using Systems analyst. Errors have been sought and corrected,but may not always be located. Such creation errors do not reflect on the standard of care.

## 2019-05-18 NOTE — Progress Notes (Signed)
Nutrition Follow-up   DOCUMENTATION CODES:   Severe malnutrition in context of social or environmental circumstances  INTERVENTION:  ContinueNepro Shake poTID, each supplement provides 425 kcal and 19 grams protein  Continue Pro-Stat 30 ml po BID each meals, each supplement provides 100 kcal and 15 grams of protein  Continue daily MVI  NUTRITION DIAGNOSIS:   Severe Malnutrition related to social / environmental circumstances(inadequate oral intake) as evidenced by percent weight loss, moderate fat depletion, moderate muscle depletion, severe muscle depletion. Ongoing.  GOAL:   Patient will meet greater than or equal to 90% of their needs Progressing.  MONITOR:   PO intake, Supplement acceptance, Labs, Weight trends, I & O's  ASSESSMENT:   59 year old male with PMHx of schizophrenia, HTN, DM who has been in [place] for 44 days and is now admitted with generalized weakness, dysphagia, slurred speech, left facial droop to rule out CVA.  Pt with fairly good appetite and oral intake; pt eating anywhere from 25-100% of meals and is drinking his supplements. Pt still enjoys the Nepro and Pro-Stat and would like to continue these. Per chart, pt up ~10lbs since admit. Pt currently awaiting placement.   Medications reviewed and include: Remeron 45 mg QHS, MVI daily, senna-docusate 1 tablet daily, ampicillin   Labs reviewed: Hgb 9.3(L), Hct 27.3(L)- 4/5  Weight trend during admission: 77.1 kg on 02/11/2019, 70.8 kg on 03/28/2019, 75.9 kg on 04/27/2019, 81.6kg on 4/6  Diet Order:   Diet Order            DIET - DYS 1 Room service appropriate? Yes with Assist; Fluid consistency: Thin  Diet effective now             EDUCATION NEEDS:   No education needs have been identified at this time  Skin:  Skin Assessment: Skin Integrity Issues:(unstageable to coccyx; DTIs to bilateral heels and right ankle)  Last BM:  4/5- TYPE 4  Height:   Ht Readings from Last 1 Encounters:  03/28/19  5\' 9"  (1.753 m)   Weight:   Wt Readings from Last 1 Encounters:  05/18/19 81.6 kg   Ideal Body Weight:  72.7 kg  BMI:  Body mass index is 26.57 kg/m.  Estimated Nutritional Needs:   Kcal:  2000-2200  Protein:  100-110 grams  Fluid:  >/= 2 L/day  Koleen Distance MS, RD, LDN Please refer to [facility] for RD and/or RD on-call/weekend/after hours pager

## 2019-05-18 NOTE — Progress Notes (Signed)
Patient transferred to 1C, report given to [facility]. Patient stable upon transfer.

## 2019-05-19 DIAGNOSIS — R7881 Bacteremia: Secondary | ICD-10-CM | POA: Diagnosis not present

## 2019-05-19 DIAGNOSIS — B952 Enterococcus as the cause of diseases classified elsewhere: Secondary | ICD-10-CM | POA: Diagnosis not present

## 2019-05-19 LAB — BASIC METABOLIC PANEL
Anion gap: 7 (ref 5–15)
BUN: 23 mg/dL (ref 8–23)
CO2: 31 mmol/L (ref 22–32)
Calcium: 8.8 mg/dL — ABNORMAL LOW (ref 8.9–10.3)
Chloride: 105 mmol/L (ref 98–111)
Creatinine, Ser: 0.95 mg/dL (ref 0.61–1.24)
GFR calc Af Amer: >60 mL/min (ref >60)
GFR calc non Af Amer: >60 mL/min (ref >60)
Glucose, Bld: 86 mg/dL (ref 70–99)
Potassium: 3.2 mmol/L — ABNORMAL LOW (ref 3.5–5.1)
Sodium: 143 mmol/L (ref 135–145)

## 2019-05-19 LAB — CBC
HCT: 25.9 % — ABNORMAL LOW (ref 39.0–52.0)
Hemoglobin: 9 g/dL — ABNORMAL LOW (ref 13.0–17.0)
MCH: 30.2 pg (ref 26.0–34.0)
MCHC: 34.7 g/dL (ref 30.0–36.0)
MCV: 86.9 fL (ref 80.0–100.0)
Platelets: 121 10*3/uL — ABNORMAL LOW (ref 150–400)
RBC: 2.98 MIL/uL — ABNORMAL LOW (ref 4.22–5.81)
RDW: 13.9 % (ref 11.5–15.5)
WBC: 5.9 10*3/uL (ref 4.0–10.5)
nRBC: 0 % (ref 0.0–0.2)

## 2019-05-19 LAB — MAGNESIUM: Magnesium: 1.5 mg/dL — ABNORMAL LOW (ref 1.7–2.4)

## 2019-05-19 MED ORDER — NYSTATIN 100000 UNIT/GM EX CREA
TOPICAL_CREAM | Freq: Two times a day (BID) | CUTANEOUS | Status: DC
  Administered 2019-06-07 – 2019-07-09 (×3): 1 via TOPICAL
  Filled 2019-05-19 (×3): qty 15

## 2019-05-19 MED ORDER — POTASSIUM CHLORIDE CRYS ER 20 MEQ PO TBCR
40.0000 meq | EXTENDED_RELEASE_TABLET | Freq: Once | ORAL | Status: AC
  Administered 2019-05-19: 09:00:00 40 meq via ORAL
  Filled 2019-05-19: qty 2

## 2019-05-19 MED ORDER — MAGNESIUM SULFATE 2 GM/50ML IV SOLN
2.0000 g | Freq: Once | INTRAVENOUS | Status: AC
  Administered 2019-05-19: 2 g via INTRAVENOUS
  Filled 2019-05-19: qty 50

## 2019-05-19 NOTE — Progress Notes (Signed)
PROGRESS NOTE    Peter Parsons  [id number] DOB: 01-Apr-1960 DOA: 02/11/2019 PCP: System, Provider Not In   Brief Narrative:  59 year old male with chronic schizophrenia with frequent suicidal ideations, bipolar disorder with depression, type 2 diabetes mellitus (controlled), CKD stage II, peripheral neuropathy, BPH, hypertension presented with altered mental status.  Prolonged hospital stay awaiting placement in a memory care unit.  Does not have capacity.  Hospital course/significant events 3/31: Prolonged hospital stay of almost 6 weeks awaiting for placement.  Foley discontinued after prolonged use in the hospital (suspected was placed due to bladder outlet obstruction).  Intermittent bladder scan showing urinary retention. 4/1: Foley placed in due to urinary retention noted on repeat bladder scans.  Renal ultrasound done showed distended bladder with Foley not in the bladder. 4/2: Patient found to have gross hematuria with clots and Foley removed.  Urology consulted and a 26 French coud catheter placed.  Patient became septic with hypotension, fever and tachycardia.  Received 5 L normal saline bolus with minimal improvement and transferred to stepdown unit.  Started on Neo-Synephrine through peripheral IV and given 1 unit PRBC for acute blood loss anemia. 4/3: Stable off pressors and transferred back to medical floor.  Subjective: Patient seems stable.  Nursing concern about some fungal infection after shaving his beard due to continuous moisture around his face.  Assessment & Plan:   Principal Problem:   Enterococcal bacteremia Active Problems:   Paranoid schizophrenia ([place])   Altered mental status   Dementia ([place])   Weakness   Acute metabolic encephalopathy   Protein-calorie malnutrition, severe   Pressure injury of skin   Hypertension   Type 2 diabetes mellitus (HCC)   Dyslipidemia   BPH (benign prostatic hyperplasia)   Normocytic anemia   Thrombocytopenia (HCC)  Acute urinary retention   Urethral trauma   UTI (urinary tract infection)  Severe sepsis with septic shock.  Resolved. Secondary to catheter associated UTI. Patient had multiple straight caths done past few days and Foley placed in on 4/1. Transferred to stepdown unit and multiple IV normal saline bolus received.  Required Neo-Synephrine overnight and now stable off pressors.  Sepsis resolved. Blood and urine culture growing Enterococcus faecalis. Antibiotic narrowed to ampicillin according to sensitivity results.  Constipation. -Regular bowel regimen.  Fungal rash on his face after shaving of beard.  -Try keeping that area dry. -Nystatin cream  Hypokalemia/hypomagnesemia.  Patient has potassium of 3.2 with magnesium of 1.5. -Replete electrolytes and monitor.  Gross hematuria Secondary to traumatic catheterization.  Foley removed and urology placed in a 20 [place] coud Foley without resistance.  Recommend intermittent irrigation, keep Foley upon discharge and follow-up with urology in [place]. Urine now clear.  Received 1 unit PRBC for drop in H&H.  BPH with urinary retention Continue Foley-Foley was placed by urology after having a traumatic urethral injury and they advised to keep the Foley in until he will follow-up with them as an outpatient in 2 weeks. -We will ask urology again for recommendations regarding his catheter as we are having difficulties finding him a placement. Continue Flomax.  Chronic schizophrenia Continue paliperidone, Klonopin, mirtazapine and risperidone.  History of frequent suicidal ideation.  Does not have capacity.  Outpatient psychiatry follow-up.  Depression with bipolar disorder Continue Lexapro and trazodone.  Acute on chronic kidney disease stage II Likely ATN with sepsis + bladder outlet obstruction.  Monitor with IV fluids.  Type 2 diabetes mellitus, controlled A1c of 4.5.  CBG stable.  Thrombocytopenia Unclear etiology.  Platelet stable.  Discontinued ibuprofen.  Peripheral neuropathy Continue gabapentin  Hypertension/ hyperlipidemia. BP within goal. Off all blood pressure meds. Continue statin.  Dysphagia/dysarthria and left facial droop MRI negative for CVA.  Continue dysphagia diet  Severe protein calorie malnutrition Continue nutrition supplement and Remeron  Failure to thrive PT recommends home health with 24-hour supervision.  Awaiting placement.  Objective: Vitals:   05/18/19 1625 05/18/19 1912 05/19/19 0026 05/19/19 0800  BP: 124/76 122/74 110/66   Pulse: 88 60 (!) 53   Resp: 18 15 14    Temp: 99.7 F (37.6 C) 99.9 F (37.7 C) 99.3 F (37.4 C) 97.9 F (36.6 C)  TempSrc:  Tympanic Tympanic Axillary  SpO2: 96% 95% 96%   Weight:      Height:        Intake/Output Summary (Last 24 hours) at 05/19/2019 1313 Last data filed at 05/19/2019 0500 Gross per 24 hour  Intake 925.33 ml  Output 2000 ml  Net -1074.67 ml   Filed Weights   05/16/19 0548 05/17/19 0400 05/18/19 0532  Weight: 85.2 kg 84.4 kg 81.6 kg    Examination:  General exam: Chronically ill-appearing gentleman, appears calm and comfortable  Respiratory system: Clear to auscultation. Respiratory effort normal. Cardiovascular system: S1 & S2 heard, RRR. No JVD, murmurs, rubs, gallops or clicks. Gastrointestinal system: Soft, nontender, nondistended, bowel sounds positive. Central nervous system: Alert and oriented. No focal neurological deficits.Symmetric 5 x 5 power. Extremities: No edema, no cyanosis, pulses intact and symmetrical. Psychiatry: Judgement and insight appear impaired.   DVT prophylaxis:.  SCDs. Code Status: Full Family Communication: No family at bedside. Disposition Plan: Awaiting group home placement.  Apparently one group home will come tomorrow to evaluate him.  Consultants:   Psychiatry  Urology  Procedures:  Antimicrobials:  Ampicillin  Data Reviewed: I have personally reviewed  following labs and imaging studies  CBC: Recent Labs  Lab 05/14/19 0817 05/14/19 1311 05/15/19 0059 05/15/19 0641 05/16/19 0555 05/17/19 0459 05/19/19 0508  WBC 8.8  --  10.6*  --  6.8 5.5 5.9  HGB 11.9*   < > 8.7* 9.7* 9.3* 9.3* 9.0*  HCT 34.8*   < > 25.5*  25.0* 28.2* 27.1* 27.3* 25.9*  MCV 89.0  --  89.2  --  89.1 88.6 86.9  PLT 146*  --  102*  --  88* 98* 121*   < > = values in this interval not displayed.   Basic Metabolic Panel: Recent Labs  Lab 05/14/19 1333 05/15/19 0059 05/16/19 0555 05/19/19 0508  NA 141  --  142 143  K 3.6  --  3.4* 3.2*  CL 101  --  111 105  CO2 29  --  24 31  GLUCOSE 174*  --  90 86  BUN 34*  --  39* 23  CREATININE 1.48* 1.54* 1.08 0.95  CALCIUM 9.1  --  8.2* 8.8*  MG  --   --   --  1.5*   GFR: Estimated Creatinine Clearance: 81.7 mL/min (by C-G formula based on SCr of 0.95 mg/dL). Liver Function Tests: Recent Labs  Lab 05/14/19 1333  AST 22  ALT 14  ALKPHOS 46  BILITOT 0.6  PROT 6.2*  ALBUMIN 3.5   No results for input(s): LIPASE, AMYLASE in the last 168 hours. No results for input(s): AMMONIA in the last 168 hours. Coagulation Profile: Recent Labs  Lab 05/14/19 0817  INR 1.1   Cardiac Enzymes: No results for input(s): CKTOTAL, CKMB, CKMBINDEX, TROPONINI in the last  168 hours. BNP (last 3 results) No results for input(s): PROBNP in the last 8760 hours. HbA1C: No results for input(s): HGBA1C in the last 72 hours. CBG: Recent Labs  Lab 05/14/19 1304 05/18/19 1911  GLUCAP 154* 79   Lipid Profile: No results for input(s): CHOL, HDL, LDLCALC, TRIG, CHOLHDL, LDLDIRECT in the last 72 hours. Thyroid Function Tests: No results for input(s): TSH, T4TOTAL, FREET4, T3FREE, THYROIDAB in the last 72 hours. Anemia Panel: No results for input(s): VITAMINB12, FOLATE, FERRITIN, TIBC, IRON, RETICCTPCT in the last 72 hours. Sepsis Labs: Recent Labs  Lab 05/14/19 1317 05/14/19 1622  LATICACIDVEN 3.8* 1.9    Recent Results  (from the past 240 hour(s))  CULTURE, BLOOD (ROUTINE X 2) w Reflex to ID Panel     Status: Abnormal   Collection Time: 05/14/19  1:33 PM   Specimen: BLOOD  Result Value Ref Range Status   Specimen Description   Final    BLOOD BLOOD RIGHT HAND Performed at [facility], [address] [place], [place] [address]    Special Requests   Final    BOTTLES DRAWN AEROBIC AND ANAEROBIC Blood Culture adequate volume Performed at [facility], [address] [place], [place] [address]    Culture  Setup Time   Final    IN BOTH AEROBIC AND ANAEROBIC BOTTLES GRAM POSITIVE COCCI CRITICAL RESULT CALLED TO, READ BACK BY AND VERIFIED WITH: [place] ON 05/15/19 AT 0149 [facility] Performed at [facility], [address] [place], [place] [address]    Culture (A)  Final    ENTEROCOCCUS FAECALIS SUSCEPTIBILITIES PERFORMED ON PREVIOUS CULTURE WITHIN THE LAST 5 DAYS. Performed at [facility], [place] [address], [place], [place] [address]    Report Status 05/17/2019 FINAL  Final  CULTURE, BLOOD (ROUTINE X 2) w Reflex to ID Panel     Status: Abnormal   Collection Time: 05/14/19  1:35 PM   Specimen: BLOOD  Result Value Ref Range Status   Specimen Description   Final    BLOOD RIGHT ANTECUBITAL Performed at [facility], [place] [address], [place], [place] [address]    Special Requests   Final    BOTTLES DRAWN AEROBIC AND ANAEROBIC Blood Culture adequate volume Performed at [facility], [address] [place], [place] [address]    Culture  Setup Time   Final    Organism ID to follow IN BOTH AEROBIC AND ANAEROBIC BOTTLES GRAM POSITIVE COCCI IN PAIRS CRITICAL VALUE NOTED.  VALUE IS CONSISTENT WITH PREVIOUSLY REPORTED AND CALLED VALUE. Performed at [facility], [address] [place], [place] [address]    Culture ENTEROCOCCUS FAECALIS (A)  Final   Report Status 05/17/2019 FINAL  Final   Organism ID, Bacteria ENTEROCOCCUS FAECALIS  Final       Susceptibility   Enterococcus faecalis - MIC*    AMPICILLIN <=2 SENSITIVE Sensitive     VANCOMYCIN 1 SENSITIVE Sensitive     GENTAMICIN SYNERGY SENSITIVE Sensitive     * ENTEROCOCCUS FAECALIS  Blood Culture ID Panel (Reflexed)     Status: Abnormal   Collection Time: 05/14/19  1:35 PM  Result Value Ref Range Status   Enterococcus species DETECTED (A) NOT DETECTED Final    Comment: CRITICAL RESULT CALLED TO, READ BACK BY AND VERIFIED WITH: SCOTT HALL ON 05/15/19 AT 0604 [facility]    Vancomycin resistance NOT DETECTED NOT DETECTED Final   Listeria monocytogenes NOT DETECTED NOT DETECTED Final   Staphylococcus species  NOT DETECTED NOT DETECTED Final   Staphylococcus aureus (BCID) NOT DETECTED NOT DETECTED Final   Streptococcus species NOT DETECTED NOT DETECTED Final   Streptococcus agalactiae NOT DETECTED NOT DETECTED Final   Streptococcus pneumoniae NOT DETECTED NOT DETECTED Final   Streptococcus pyogenes NOT DETECTED NOT DETECTED Final   Acinetobacter baumannii NOT DETECTED NOT DETECTED Final   Enterobacteriaceae species NOT DETECTED NOT DETECTED Final   Enterobacter cloacae complex NOT DETECTED NOT DETECTED Final   Escherichia coli NOT DETECTED NOT DETECTED Final   Klebsiella oxytoca NOT DETECTED NOT DETECTED Final   Klebsiella pneumoniae NOT DETECTED NOT DETECTED Final   Proteus species NOT DETECTED NOT DETECTED Final   Serratia marcescens NOT DETECTED NOT DETECTED Final   Haemophilus influenzae NOT DETECTED NOT DETECTED Final   Neisseria meningitidis NOT DETECTED NOT DETECTED Final   Pseudomonas aeruginosa NOT DETECTED NOT DETECTED Final   Candida albicans NOT DETECTED NOT DETECTED Final   Candida glabrata NOT DETECTED NOT DETECTED Final   Candida krusei NOT DETECTED NOT DETECTED Final   Candida parapsilosis NOT DETECTED NOT DETECTED Final   Candida tropicalis NOT DETECTED NOT DETECTED Final    Comment: Performed at [facility], [address] [place], [place] [address]    Urine Culture     Status: Abnormal   Collection Time: 05/14/19  1:51 PM   Specimen: Urine, Catheterized  Result Value Ref Range Status   Specimen Description   Final    URINE, CATHETERIZED Performed at [facility], [address], [place], [place] [address]    Special Requests   Final    Normal Performed at [facility], [address] [place], [place] [address]    Culture >=100,000 COLONIES/mL ENTEROCOCCUS FAECALIS (A)  Final   Report Status 05/16/2019 FINAL  Final   Organism ID, Bacteria ENTEROCOCCUS FAECALIS (A)  Final      Susceptibility   Enterococcus faecalis - MIC*    AMPICILLIN <=2 SENSITIVE Sensitive     NITROFURANTOIN <=16 SENSITIVE Sensitive     VANCOMYCIN 1 SENSITIVE Sensitive     * >=100,000 COLONIES/mL ENTEROCOCCUS FAECALIS  MRSA PCR Screening     Status: None   Collection Time: 05/14/19  9:14 PM   Specimen: Nasal Mucosa; Nasopharyngeal  Result Value Ref Range Status   MRSA by PCR NEGATIVE NEGATIVE Final    Comment:        The GeneXpert MRSA Assay (FDA approved for NASAL specimens only), is one component of a comprehensive MRSA colonization surveillance program. It is not intended to diagnose MRSA infection nor to guide or monitor treatment for MRSA infections. Performed at [facility], [address] [place], [place] [address]   Culture, blood (routine x 2)     Status: None (Preliminary result)   Collection Time: 05/15/19 11:23 AM   Specimen: BLOOD  Result Value Ref Range Status   Specimen Description BLOOD RIGHT [facility]  Final   Special Requests BLOOD Blood Culture adequate volume  Final   Culture   Final    NO GROWTH 4 DAYS Performed at [facility], [address], [place], [place] [address]    Report Status PENDING  Incomplete  Culture, blood (routine x 2)     Status: None (Preliminary result)   Collection Time: 05/15/19 12:47 PM   Specimen: BLOOD  Result Value Ref Range Status   Specimen Description  BLOOD BLRA  Final   Special Requests   Final    BOTTLES DRAWN AEROBIC AND  ANAEROBIC Blood Culture results may not be optimal due to an excessive volume of blood received in culture bottles   Culture   Final    NO GROWTH 4 DAYS Performed at [facility], [address] [place] [place], [place] [address]    Report Status PENDING  Incomplete     Radiology Studies: No results found.  Scheduled Meds: . atorvastatin  10 mg Oral Daily  . chlorhexidine  15 mL Mouth/Throat BID  . Chlorhexidine Gluconate Cloth  6 each Topical Daily  . escitalopram  10 mg Oral Daily  . feeding supplement (NEPRO CARB STEADY)  237 mL Oral TID BM  . feeding supplement (PRO-STAT SUGAR FREE 64)  30 mL Oral BID  . gabapentin  300 mg Oral TID  . mirtazapine  45 mg Oral QHS  . multivitamin with minerals  1 tablet Oral Daily  . risperiDONE  3 mg Oral BID  . senna-docusate  2 tablet Oral BID  . sodium chloride flush  10-40 mL Intracatheter Q12H  . tamsulosin  0.4 mg Oral Daily  . traZODone  150 mg Oral QHS   Continuous Infusions: . sodium chloride 250 mL (05/19/19 0429)  . ampicillin (OMNIPEN) IV 2 g (05/19/19 1214)  . magnesium sulfate bolus IVPB 2 g (05/19/19 1217)     LOS: 51 days   Time spent: 35 Minutes.  Lorella Nimrod, MD [facility]  If 7PM-7AM, please contact night-coverage [email or web address]  05/19/2019, 1:13 PM   This record has been created using Systems analyst. Errors have been sought and corrected,but may not always be located. Such creation errors do not reflect on the standard of care.

## 2019-05-19 NOTE — Progress Notes (Signed)
ID   Subjective Awake , alert but not talking much  Patient Vitals for the past 24 hrs:  BP Temp Temp src Pulse Resp SpO2  05/19/19 0800 -- 97.9 F (36.6 C) Axillary -- -- --  05/19/19 0026 110/66 99.3 F (37.4 C) Tympanic (!) 53 14 96 %  05/18/19 1912 122/74 99.9 F (37.7 C) Tympanic 60 15 95 %  05/18/19 1625 124/76 99.7 F (37.6 C) -- 88 18 96 %    O/E  No distress Chest B/l air entry HS s1s2 Foley in place- clear urine    Labs  CBC Latest Ref Rng & Units 05/19/2019 05/17/2019 05/16/2019  WBC 4.0 - 10.5 K/uL 5.9 5.5 6.8  Hemoglobin 13.0 - 17.0 g/dL 9.0(L) 9.3(L) 9.3(L)  Hematocrit 39.0 - 52.0 % 25.9(L) 27.3(L) 27.1(L)  Platelets 150 - 400 K/uL 121(L) 98(L) 88(L)    CMP Latest Ref Rng & Units 05/19/2019 05/16/2019 05/15/2019  Glucose 70 - 99 mg/dL 86 90 -  BUN 8 - 23 mg/dL 23 39(H) -  Creatinine 0.61 - 1.24 mg/dL 0.95 1.08 1.54(H)  Sodium 135 - 145 mmol/L 143 142 -  Potassium 3.5 - 5.1 mmol/L 3.2(L) 3.4(L) -  Chloride 98 - 111 mmol/L 105 111 -  CO2 22 - 32 mmol/L 31 24 -  Calcium 8.9 - 10.3 mg/dL 8.8(L) 8.2(L) -  Total Protein 6.5 - 8.1 g/dL - - -  Total Bilirubin 0.3 - 1.2 mg/dL - - -  Alkaline Phos 38 - 126 U/L - - -  AST 15 - 41 U/L - - -  ALT 0 - 44 U/L - - -   Micro 4/2 enterococcus Faecalis in [facility] and UC 05/15/19 - BC NG  Impression/recommendation Enterococcus faecalis bacteremia and UTI secondary to sub acute urinary retention leading to traumatic catheterization Currently has coude And on ampicillin No suspicion for endocarditis Will give IV ampicillin until 05/24/19 BPH on tamsulosin  Prostate ca s/p seeds  Schizophrenia on klonopin, gabapentin, risperidone, remeron lexapro  Constipation Both Urinary retention and constipation due to anticholinergic effects of his psych meds and immobility-  pt should have daily PT for ambulation and increased mobility   ID will sign off- call if needed

## 2019-05-19 NOTE — TOC Progression Note (Addendum)
Transition of Care [facility]) - Progression Note    Patient Details  Name: Peter Parsons MRN: [id number] Date of Birth: 1960/07/04  [facility] CM/SW Contact  Anselm Pancoast, RN Phone Number: 05/19/2019, 10:10 AM  Clinical Narrative:    Spoke to [place] Miners with [facility] states he would like to come interview patient Thursday @ 1030 for possible placement. Updated RN CM on the unit.    Expected Discharge Plan: Group Home Barriers to Discharge: SNF Pending bed offer  Expected Discharge Plan and Services Expected Discharge Plan: Group Home   Discharge Planning Services: CM Consult   Living arrangements for the past 2 months: Group Home                 DME Arranged: N/A DME Agency: [employer] Date DME Agency Contacted: 04/12/19 Time DME Agency Contacted: [phone] Representative spoke with at DME Agency: none needed [place] Arranged: NA           Social Determinants of Health ([place]) Interventions    Readmission Risk Interventions Readmission Risk Prevention Plan 04/27/2019 04/20/2019  Transportation Screening Complete Complete  PCP or Specialist Appt within 3-5 Days - Not Complete  Not Complete comments - not ready to [place] or Home Care Consult - Not Complete  HRI or Home Care Consult comments - not ready toDC, looking for a bed for placement  Social Work Consult for [place] Planning/Counseling - [place] - Not Applicable  Medication Review (RN Care Manager) Referral to Pharmacy Referral to Pharmacy  PCP or Specialist appointment within 3-5 days of discharge Complete -  [place] or [place] Complete -  [place] Not Applicable -

## 2019-05-20 DIAGNOSIS — R7881 Bacteremia: Secondary | ICD-10-CM | POA: Diagnosis not present

## 2019-05-20 DIAGNOSIS — B952 Enterococcus as the cause of diseases classified elsewhere: Secondary | ICD-10-CM | POA: Diagnosis not present

## 2019-05-20 LAB — CULTURE, BLOOD (ROUTINE X 2)
Culture: NO GROWTH
Culture: NO GROWTH
Special Requests: ADEQUATE

## 2019-05-20 LAB — BASIC METABOLIC PANEL
Anion gap: 7 (ref 5–15)
BUN: 23 mg/dL (ref 8–23)
CO2: 33 mmol/L — ABNORMAL HIGH (ref 22–32)
Calcium: 8.9 mg/dL (ref 8.9–10.3)
Chloride: 103 mmol/L (ref 98–111)
Creatinine, Ser: 0.85 mg/dL (ref 0.61–1.24)
GFR calc Af Amer: >60 mL/min (ref >60)
GFR calc non Af Amer: >60 mL/min (ref >60)
Glucose, Bld: 121 mg/dL — ABNORMAL HIGH (ref 70–99)
Potassium: 3.6 mmol/L (ref 3.5–5.1)
Sodium: 143 mmol/L (ref 135–145)

## 2019-05-20 LAB — MAGNESIUM: Magnesium: 1.7 mg/dL (ref 1.7–2.4)

## 2019-05-20 MED ORDER — MAGNESIUM SULFATE 2 GM/50ML IV SOLN
2.0000 g | Freq: Once | INTRAVENOUS | Status: AC
  Administered 2019-05-20: 2 g via INTRAVENOUS
  Filled 2019-05-20: qty 50

## 2019-05-20 MED ORDER — POLYETHYLENE GLYCOL 3350 17 G PO PACK
17.0000 g | PACK | Freq: Every evening | ORAL | Status: DC | PRN
  Administered 2019-05-20: 17 g via ORAL
  Filled 2019-05-20: qty 1

## 2019-05-20 NOTE — Progress Notes (Signed)
Patient refused to be turned. He did want to get up to chair and go to [facility]. Patient is one assist to do so.

## 2019-05-20 NOTE — Progress Notes (Signed)
MEWS Guidelines - (patients age 61 and over) ° ° °
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
## 2019-05-20 NOTE — Progress Notes (Signed)
PROGRESS NOTE    Peter Parsons  [id number] DOB: 1960-08-26 DOA: 02/11/2019 PCP: System, Provider Not In   Brief Narrative:  59 year old male with chronic schizophrenia with frequent suicidal ideations, bipolar disorder with depression, type 2 diabetes mellitus (controlled), CKD stage II, peripheral neuropathy, BPH, hypertension presented with altered mental status.  Prolonged hospital stay awaiting placement in a memory care unit.  Does not have capacity.  Hospital course/significant events 3/31: Prolonged hospital stay of almost 6 weeks awaiting for placement.  Foley discontinued after prolonged use in the hospital (suspected was placed due to bladder outlet obstruction).  Intermittent bladder scan showing urinary retention. 4/1: Foley placed in due to urinary retention noted on repeat bladder scans.  Renal ultrasound done showed distended bladder with Foley not in the bladder. 4/2: Patient found to have gross hematuria with clots and Foley removed.  Urology consulted and a 55 French coud catheter placed.  Patient became septic with hypotension, fever and tachycardia.  Received 5 L normal saline bolus with minimal improvement and transferred to stepdown unit.  Started on Neo-Synephrine through peripheral IV and given 1 unit PRBC for acute blood loss anemia. 4/3: Stable off pressors and transferred back to medical floor.  Subjective: Patient seems stable.  No new complaints.  Assessment & Plan:   Principal Problem:   Enterococcal bacteremia Active Problems:   Paranoid schizophrenia ([place])   Altered mental status   Dementia ([place])   Weakness   Acute metabolic encephalopathy   Protein-calorie malnutrition, severe   Pressure injury of skin   Hypertension   Type 2 diabetes mellitus (HCC)   Dyslipidemia   BPH (benign prostatic hyperplasia)   Normocytic anemia   Thrombocytopenia (HCC)   Acute urinary retention   Urethral trauma   UTI (urinary tract infection)  Severe sepsis  with septic shock.  Resolved. Secondary to catheter associated UTI. Patient had multiple straight caths done past few days and Foley placed in on 4/1. Transferred to stepdown unit and multiple IV normal saline bolus received.  Required Neo-Synephrine overnight and now stable off pressors.  Sepsis resolved. Blood and urine culture growing Enterococcus faecalis. Antibiotic narrowed to ampicillin according to sensitivity results.  Constipation. -Regular bowel regimen.  Fungal rash on his face after shaving of beard.  -Try keeping that area dry. -Nystatin cream  Hypokalemia/hypomagnesemia.  Patient has potassium of 3.2 with magnesium of 1.5. -Replete electrolytes and monitor.  Gross hematuria Secondary to traumatic catheterization.  Foley removed and urology placed in a 20 [place] coud Foley without resistance.  Recommend intermittent irrigation, keep Foley upon discharge and follow-up with urology in [place]. Urine now clear.  Received 1 unit PRBC for drop in H&H.  BPH with urinary retention Continue Foley-Foley was placed by urology after having a traumatic urethral injury and they advised to keep the Foley in until he will follow-up with them as an outpatient in 2 weeks. -Allergy was consulted again as he remains in the hospital-to keep the catheter till Friday and then they will see him for a voiding trial. Continue Flomax.  Chronic schizophrenia Continue paliperidone, Klonopin, mirtazapine and risperidone.  History of frequent suicidal ideation.  Does not have capacity.  Outpatient psychiatry follow-up.  Depression with bipolar disorder Continue Lexapro and trazodone.  Acute on chronic kidney disease stage II Likely ATN with sepsis + bladder outlet obstruction.  Monitor with IV fluids.  Type 2 diabetes mellitus, controlled A1c of 4.5.  CBG stable.  Thrombocytopenia Unclear etiology.  Platelet stable.  Discontinued ibuprofen.  Peripheral  neuropathy Continue  gabapentin  Hypertension/ hyperlipidemia. BP within goal. Off all blood pressure meds. Continue statin.  Dysphagia/dysarthria and left facial droop MRI negative for CVA.  Continue dysphagia diet  Severe protein calorie malnutrition Continue nutrition supplement and Remeron  Failure to thrive PT recommends home health with 24-hour supervision.  Awaiting placement.  Objective: Vitals:   05/20/19 0100 05/20/19 0300 05/20/19 0500 05/20/19 0730  BP: (!) 178/81 110/65 131/90 (!) 124/55  Pulse: (!) 101 66 84 88  Resp: (!) 26 17 18 15   Temp:  99.9 F (37.7 C) 99.1 F (37.3 C) 98.8 F (37.1 C)  TempSrc:  Oral  Oral  SpO2: 95% 97% 93% 96%  Weight:      Height:        Intake/Output Summary (Last 24 hours) at 05/20/2019 1628 Last data filed at 05/20/2019 1300 Gross per 24 hour  Intake 424.25 ml  Output 2300 ml  Net -1875.75 ml   Filed Weights   05/16/19 0548 05/17/19 0400 05/18/19 0532  Weight: 85.2 kg 84.4 kg 81.6 kg    Examination:  General exam: Chronically ill-appearing gentleman, appears calm and comfortable  Respiratory system: Clear to auscultation. Respiratory effort normal. Cardiovascular system: S1 & S2 heard, RRR. No JVD, murmurs, rubs, gallops or clicks. Gastrointestinal system: Soft, nontender, nondistended, bowel sounds positive. Central nervous system: Alert and oriented. No focal neurological deficits.Symmetric 5 x 5 power. Extremities: No edema, no cyanosis, pulses intact and symmetrical. Psychiatry: Judgement and insight appear impaired.   DVT prophylaxis:.  SCDs. Code Status: Full Family Communication: No family at bedside. Disposition Plan: Fecal disposition.  He was evaluated by a group home today and they refused to take him due to complex medical needs. Social worker continue to look for a place for him.  Consultants:   Psychiatry  Urology  Procedures:  Antimicrobials:  Ampicillin  Data Reviewed: I have personally reviewed following  labs and imaging studies  CBC: Recent Labs  Lab 05/14/19 0817 05/14/19 1311 05/15/19 0059 05/15/19 0641 05/16/19 0555 05/17/19 0459 05/19/19 0508  WBC 8.8  --  10.6*  --  6.8 5.5 5.9  HGB 11.9*   < > 8.7* 9.7* 9.3* 9.3* 9.0*  HCT 34.8*   < > 25.5*  25.0* 28.2* 27.1* 27.3* 25.9*  MCV 89.0  --  89.2  --  89.1 88.6 86.9  PLT 146*  --  102*  --  88* 98* 121*   < > = values in this interval not displayed.   Basic Metabolic Panel: Recent Labs  Lab 05/14/19 1333 05/15/19 0059 05/16/19 0555 05/19/19 0508 05/20/19 0610  NA 141  --  142 143 143  K 3.6  --  3.4* 3.2* 3.6  CL 101  --  111 105 103  CO2 29  --  24 31 33*  GLUCOSE 174*  --  90 86 121*  BUN 34*  --  39* 23 23  CREATININE 1.48* 1.54* 1.08 0.95 0.85  CALCIUM 9.1  --  8.2* 8.8* 8.9  MG  --   --   --  1.5* 1.7   GFR: Estimated Creatinine Clearance: 91.3 mL/min (by C-G formula based on SCr of 0.85 mg/dL). Liver Function Tests: Recent Labs  Lab 05/14/19 1333  AST 22  ALT 14  ALKPHOS 46  BILITOT 0.6  PROT 6.2*  ALBUMIN 3.5   No results for input(s): LIPASE, AMYLASE in the last 168 hours. No results for input(s): AMMONIA in the last 168 hours. Coagulation Profile: Recent Labs  Lab 05/14/19 0817  INR 1.1   Cardiac Enzymes: No results for input(s): CKTOTAL, CKMB, CKMBINDEX, TROPONINI in the last 168 hours. BNP (last 3 results) No results for input(s): PROBNP in the last 8760 hours. HbA1C: No results for input(s): HGBA1C in the last 72 hours. CBG: Recent Labs  Lab 05/14/19 1304 05/18/19 1911  GLUCAP 154* 79   Lipid Profile: No results for input(s): CHOL, HDL, LDLCALC, TRIG, CHOLHDL, LDLDIRECT in the last 72 hours. Thyroid Function Tests: No results for input(s): TSH, T4TOTAL, FREET4, T3FREE, THYROIDAB in the last 72 hours. Anemia Panel: No results for input(s): VITAMINB12, FOLATE, FERRITIN, TIBC, IRON, RETICCTPCT in the last 72 hours. Sepsis Labs: Recent Labs  Lab 05/14/19 1317 05/14/19 1622    LATICACIDVEN 3.8* 1.9    Recent Results (from the past 240 hour(s))  CULTURE, BLOOD (ROUTINE X 2) w Reflex to ID Panel     Status: Abnormal   Collection Time: 05/14/19  1:33 PM   Specimen: BLOOD  Result Value Ref Range Status   Specimen Description   Final    BLOOD BLOOD RIGHT HAND Performed at [facility], [address], [place], [place] [address]    Special Requests   Final    BOTTLES DRAWN AEROBIC AND ANAEROBIC Blood Culture adequate volume Performed at [facility], [address] [place], [place] [address]    Culture  Setup Time   Final    IN BOTH AEROBIC AND ANAEROBIC BOTTLES GRAM POSITIVE COCCI CRITICAL RESULT CALLED TO, READ BACK BY AND VERIFIED WITH: Whitney ON 05/15/19 AT 0149 [facility] Performed at [facility], [address], [place], [place] [address]    Culture (A)  Final    ENTEROCOCCUS FAECALIS SUSCEPTIBILITIES PERFORMED ON PREVIOUS CULTURE WITHIN THE LAST 5 DAYS. Performed at [facility], [place] [address], [place], [place] [address]    Report Status 05/17/2019 FINAL  Final  CULTURE, BLOOD (ROUTINE X 2) w Reflex to ID Panel     Status: Abnormal   Collection Time: 05/14/19  1:35 PM   Specimen: BLOOD  Result Value Ref Range Status   Specimen Description   Final    BLOOD RIGHT ANTECUBITAL Performed at [facility], [place] [address] [place], [place] [address]    Special Requests   Final    BOTTLES DRAWN AEROBIC AND ANAEROBIC Blood Culture adequate volume Performed at [facility], [address] [place], [place] [address]    Culture  Setup Time   Final    Organism ID to follow IN BOTH AEROBIC AND ANAEROBIC BOTTLES GRAM POSITIVE COCCI IN PAIRS CRITICAL VALUE NOTED.  VALUE IS CONSISTENT WITH PREVIOUSLY REPORTED AND CALLED VALUE. Performed at [facility], [address] [place], [place] [address]    Culture ENTEROCOCCUS FAECALIS (A)  Final   Report Status 05/17/2019 FINAL  Final   Organism ID,  Bacteria ENTEROCOCCUS FAECALIS  Final      Susceptibility   Enterococcus faecalis - MIC*    AMPICILLIN <=2 SENSITIVE Sensitive     VANCOMYCIN 1 SENSITIVE Sensitive     GENTAMICIN SYNERGY SENSITIVE Sensitive     * ENTEROCOCCUS FAECALIS  Blood Culture ID Panel (Reflexed)     Status: Abnormal   Collection Time: 05/14/19  1:35 PM  Result Value Ref Range Status   Enterococcus species DETECTED (A) NOT DETECTED Final    Comment: CRITICAL RESULT CALLED TO, READ BACK BY AND VERIFIED WITH: SCOTT HALL ON 05/15/19 AT 0604 [facility]  Vancomycin resistance NOT DETECTED NOT DETECTED Final   Listeria monocytogenes NOT DETECTED NOT DETECTED Final   Staphylococcus species NOT DETECTED NOT DETECTED Final   Staphylococcus aureus (BCID) NOT DETECTED NOT DETECTED Final   Streptococcus species NOT DETECTED NOT DETECTED Final   Streptococcus agalactiae NOT DETECTED NOT DETECTED Final   Streptococcus pneumoniae NOT DETECTED NOT DETECTED Final   Streptococcus pyogenes NOT DETECTED NOT DETECTED Final   Acinetobacter baumannii NOT DETECTED NOT DETECTED Final   Enterobacteriaceae species NOT DETECTED NOT DETECTED Final   Enterobacter cloacae complex NOT DETECTED NOT DETECTED Final   Escherichia coli NOT DETECTED NOT DETECTED Final   Klebsiella oxytoca NOT DETECTED NOT DETECTED Final   Klebsiella pneumoniae NOT DETECTED NOT DETECTED Final   Proteus species NOT DETECTED NOT DETECTED Final   Serratia marcescens NOT DETECTED NOT DETECTED Final   Haemophilus influenzae NOT DETECTED NOT DETECTED Final   Neisseria meningitidis NOT DETECTED NOT DETECTED Final   Pseudomonas aeruginosa NOT DETECTED NOT DETECTED Final   Candida albicans NOT DETECTED NOT DETECTED Final   Candida glabrata NOT DETECTED NOT DETECTED Final   Candida krusei NOT DETECTED NOT DETECTED Final   Candida parapsilosis NOT DETECTED NOT DETECTED Final   Candida tropicalis NOT DETECTED NOT DETECTED Final    Comment: Performed at [facility], [address], [place], [place] [address]  Urine Culture     Status: Abnormal   Collection Time: 05/14/19  1:51 PM   Specimen: Urine, Catheterized  Result Value Ref Range Status   Specimen Description   Final    URINE, CATHETERIZED Performed at [facility], [address] [place], [place] [address]    Special Requests   Final    Normal Performed at [facility], [address] [place], [place] [address]    Culture >=100,000 COLONIES/mL ENTEROCOCCUS FAECALIS (A)  Final   Report Status 05/16/2019 FINAL  Final   Organism ID, Bacteria ENTEROCOCCUS FAECALIS (A)  Final      Susceptibility   Enterococcus faecalis - MIC*    AMPICILLIN <=2 SENSITIVE Sensitive     NITROFURANTOIN <=16 SENSITIVE Sensitive     VANCOMYCIN 1 SENSITIVE Sensitive     * >=100,000 COLONIES/mL ENTEROCOCCUS FAECALIS  MRSA PCR Screening     Status: None   Collection Time: 05/14/19  9:14 PM   Specimen: Nasal Mucosa; Nasopharyngeal  Result Value Ref Range Status   MRSA by PCR NEGATIVE NEGATIVE Final    Comment:        The GeneXpert MRSA Assay (FDA approved for NASAL specimens only), is one component of a comprehensive MRSA colonization surveillance program. It is not intended to diagnose MRSA infection nor to guide or monitor treatment for MRSA infections. Performed at [facility], [address] [place], [place] [address]   Culture, blood (routine x 2)     Status: None   Collection Time: 05/15/19 11:23 AM   Specimen: BLOOD  Result Value Ref Range Status   Specimen Description BLOOD RIGHT [facility]  Final   Special Requests BLOOD Blood Culture adequate volume  Final   Culture   Final    NO GROWTH 5 DAYS Performed at [facility], [address], [place], [place] [address]    Report Status 05/20/2019 FINAL  Final  Culture, blood (routine x 2)     Status: None   Collection Time: 05/15/19 12:47 PM   Specimen: BLOOD  Result Value Ref Range Status   Specimen Description  BLOOD BLRA  Final   Special Requests   Final    BOTTLES DRAWN AEROBIC AND ANAEROBIC Blood Culture results may not be optimal due to an excessive volume of blood received in culture bottles   Culture   Final    NO GROWTH 5 DAYS Performed at [facility], [address], [place], [place] [address]    Report Status 05/20/2019 FINAL  Final     Radiology Studies: No results found.  Scheduled Meds: . atorvastatin  10 mg Oral Daily  . chlorhexidine  15 mL Mouth/Throat BID  . Chlorhexidine Gluconate Cloth  6 each Topical Daily  . escitalopram  10 mg Oral Daily  . feeding supplement (NEPRO CARB STEADY)  237 mL Oral TID BM  . feeding supplement (PRO-STAT SUGAR FREE 64)  30 mL Oral BID  . gabapentin  300 mg Oral TID  . mirtazapine  45 mg Oral QHS  . multivitamin with minerals  1 tablet Oral Daily  . nystatin cream   Topical BID  . risperiDONE  3 mg Oral BID  . senna-docusate  2 tablet Oral BID  . sodium chloride flush  10-40 mL Intracatheter Q12H  . tamsulosin  0.4 mg Oral Daily  . traZODone  150 mg Oral QHS   Continuous Infusions: . sodium chloride Stopped (05/19/19 1106)  . ampicillin (OMNIPEN) IV 2 g (05/20/19 1224)     LOS: 52 days   Time spent: 35 Minutes.  Lorella Nimrod, MD [facility]  If 7PM-7AM, please contact night-coverage [email or web address]  05/20/2019, 4:28 PM   This record has been created using Systems analyst. Errors have been sought and corrected,but may not always be located. Such creation errors do not reflect on the standard of care.
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
## 2019-05-20 NOTE — TOC Progression Note (Signed)
Transition of Care [facility]) - Progression Note    Patient Details  Name: Peter Parsons MRN: [id number] Date of Birth: Feb 03, 1961  Transition of Care [facility]) CM/SW Contact  Anselm Pancoast, RN Phone Number: 05/20/2019, 11:21 AM  Clinical Narrative:    Met with patient and [place] [facility] from group home to assess for placement. [place] [facility] advised patient was too medically complex for group home placement. Unable to assist with placement.   Expected Discharge Plan: Group Home Barriers to Discharge: SNF Pending bed offer  Expected Discharge Plan and Services Expected Discharge Plan: Group Home   Discharge Planning Services: CM Consult   Living arrangements for the past 2 months: Group Home                 DME Arranged: N/A DME Agency: [employer] Date DME Agency Contacted: 04/12/19 Time DME Agency Contacted: [phone] Representative spoke with at DME Agency: none needed [place] Arranged: NA           Social Determinants of Health ([place]) Interventions    Readmission Risk Interventions Readmission Risk Prevention Plan 04/27/2019 04/20/2019  Transportation Screening Complete Complete  PCP or Specialist Appt within 3-5 Days - Not Complete  Not Complete comments - not ready to [place] or Home Care Consult - Not Complete  HRI or Home Care Consult comments - not ready toDC, looking for a bed for placement  Social Work Consult for [place] Planning/Counseling - [place] - Not Applicable  Medication Review (RN Care Manager) Referral to Pharmacy Referral to Pharmacy  PCP or Specialist appointment within 3-5 days of discharge Complete -  [place] or [place] Complete -  [place] Not Applicable -

## 2019-05-20 NOTE — TOC Progression Note (Signed)
Transition of Care [facility]) - Progression Note    Patient Details  Name: Peter Parsons MRN: [id number] Date of Birth: 12/16/1960  Transition of Care [facility]) CM/SW Contact  Shelbie Ammons, RN Phone Number: 05/20/2019, 10:15 AM  Clinical Narrative:   RNCM met with patient at bedside, patient alert and verbally responsive. Discussed that someone from group home is coming to evaluate him today and he was pleasant about the news. OT in room to work with patient. Messaged MD to request PT come back and eval patient.     Expected Discharge Plan: Group Home Barriers to Discharge: SNF Pending bed offer  Expected Discharge Plan and Services Expected Discharge Plan: Group Home   Discharge Planning Services: CM Consult   Living arrangements for the past 2 months: Group Home                 DME Arranged: N/A DME Agency: [employer] Date DME Agency Contacted: 04/12/19 Time DME Agency Contacted: [phone] Representative spoke with at DME Agency: none needed [place] Arranged: NA           Social Determinants of Health ([place]) Interventions    Readmission Risk Interventions Readmission Risk Prevention Plan 04/27/2019 04/20/2019  Transportation Screening Complete Complete  PCP or Specialist Appt within 3-5 Days - Not Complete  Not Complete comments - not ready to [place] or Home Care Consult - Not Complete  HRI or Home Care Consult comments - not ready to[place], looking for a bed for placement  Social Work Consult for [place] Planning/Counseling - [place] - Not Applicable  Medication Review (RN Care Manager) Referral to Pharmacy Referral to Pharmacy  PCP or Specialist appointment within 3-5 days of discharge Complete -  [place] or [place] Complete -  [place] Not Applicable -

## 2019-05-21 DIAGNOSIS — B952 Enterococcus as the cause of diseases classified elsewhere: Secondary | ICD-10-CM | POA: Diagnosis not present

## 2019-05-21 DIAGNOSIS — R7881 Bacteremia: Secondary | ICD-10-CM | POA: Diagnosis not present

## 2019-05-21 MED ORDER — MAGNESIUM SULFATE 2 GM/50ML IV SOLN
2.0000 g | Freq: Once | INTRAVENOUS | Status: AC
  Administered 2019-05-21: 13:00:00 2 g via INTRAVENOUS
  Filled 2019-05-21: qty 50

## 2019-05-21 NOTE — Progress Notes (Signed)
PROGRESS NOTE    Peter Parsons  [id number] DOB: 20-Jul-1960 DOA: 02/11/2019 PCP: System, Provider Not In   Brief Narrative:  59 year old male with chronic schizophrenia with frequent suicidal ideations, bipolar disorder with depression, type 2 diabetes mellitus (controlled), CKD stage II, peripheral neuropathy, BPH, hypertension presented with altered mental status.  Prolonged hospital stay awaiting placement in a memory care unit.  Does not have capacity.  Hospital course/significant events 3/31: Prolonged hospital stay of almost 6 weeks awaiting for placement.  Foley discontinued after prolonged use in the hospital (suspected was placed due to bladder outlet obstruction).  Intermittent bladder scan showing urinary retention. 4/1: Foley placed in due to urinary retention noted on repeat bladder scans.  Renal ultrasound done showed distended bladder with Foley not in the bladder. 4/2: Patient found to have gross hematuria with clots and Foley removed.  Urology consulted and a 40 French coud catheter placed.  Patient became septic with hypotension, fever and tachycardia.  Received 5 L normal saline bolus with minimal improvement and transferred to stepdown unit.  Started on Neo-Synephrine through peripheral IV and given 1 unit PRBC for acute blood loss anemia. 4/3: Stable off pressors and transferred back to medical floor.  Subjective: Patient seems depressed today, stating that no but he can help him. No new complaints.  Assessment & Plan:   Principal Problem:   Enterococcal bacteremia Active Problems:   Paranoid schizophrenia ([place])   Altered mental status   Dementia ([place])   Weakness   Acute metabolic encephalopathy   Protein-calorie malnutrition, severe   Pressure injury of skin   Hypertension   Type 2 diabetes mellitus (HCC)   Dyslipidemia   BPH (benign prostatic hyperplasia)   Normocytic anemia   Thrombocytopenia (HCC)   Acute urinary retention   Urethral trauma   UTI  (urinary tract infection)  Severe sepsis with septic shock.  Resolved. Secondary to catheter associated UTI. Patient had multiple straight caths done past few days and Foley placed in on 4/1. Transferred to stepdown unit and multiple IV normal saline bolus received.  Required Neo-Synephrine overnight and now stable off pressors.  Sepsis resolved. Blood and urine culture growing Enterococcus faecalis. Antibiotic narrowed to ampicillin according to sensitivity results.  Constipation. -Regular bowel regimen.  Fungal rash on his face after shaving of beard.  -Try keeping that area dry. -Nystatin cream  Hypokalemia/hypomagnesemia.  Patient has potassium of 3.2 with magnesium of 1.5. -Replete electrolytes and monitor.  Gross hematuria Secondary to traumatic catheterization.  Foley removed and urology placed in a 20 [place] coud Foley without resistance.  Recommend intermittent irrigation, keep Foley upon discharge and follow-up with urology in [place]. Urine now clear.  Received 1 unit PRBC for drop in H&H.  BPH with urinary retention Continue Foley-Foley was placed by urology after having a traumatic urethral injury and they advised to keep the Foley in until he will follow-up with them as an outpatient in 2 weeks. -Allergy was consulted again as he remains in the hospital-to keep the catheter till Friday and then they will see him for a voiding trial. Continue Flomax.  Chronic schizophrenia Continue paliperidone, Klonopin, mirtazapine and risperidone.  History of frequent suicidal ideation.  Does not have capacity.  Outpatient psychiatry follow-up.  Depression with bipolar disorder Continue Lexapro and trazodone.  Acute on chronic kidney disease stage II Likely ATN with sepsis + bladder outlet obstruction.  Monitor with IV fluids.  Type 2 diabetes mellitus, controlled A1c of 4.5.  CBG stable.  Thrombocytopenia Unclear etiology.  Platelet stable.  Discontinued ibuprofen.   Peripheral neuropathy Continue gabapentin  Hypertension/ hyperlipidemia. BP within goal. Off all blood pressure meds. Continue statin.  Dysphagia/dysarthria and left facial droop MRI negative for CVA.  Continue dysphagia diet  Severe protein calorie malnutrition Continue nutrition supplement and Remeron  Failure to thrive PT recommends home health with 24-hour supervision.  Awaiting placement.  Objective: Vitals:   05/20/19 0500 05/20/19 0730 05/21/19 0012 05/21/19 0804  BP: 131/90 (!) 124/55 123/72 107/65  Pulse: 84 88 78 (!) 59  Resp: 18 15 19 15   Temp: 99.1 F (37.3 C) 98.8 F (37.1 C) 98 F (36.7 C) 98.1 F (36.7 C)  TempSrc:  Oral    SpO2: 93% 96% 97% 97%  Weight:      Height:        Intake/Output Summary (Last 24 hours) at 05/21/2019 1631 Last data filed at 05/21/2019 1039 Gross per 24 hour  Intake 260 ml  Output 1400 ml  Net -1140 ml   Filed Weights   05/16/19 0548 05/17/19 0400 05/18/19 0532  Weight: 85.2 kg 84.4 kg 81.6 kg    Examination:  General exam: Chronically ill-appearing gentleman, appears calm and comfortable  Respiratory system: Clear to auscultation. Respiratory effort normal. Cardiovascular system: S1 & S2 heard, RRR. No JVD, murmurs, rubs, gallops or clicks. Gastrointestinal system: Soft, nontender, nondistended, bowel sounds positive. Central nervous system: Alert and oriented. No focal neurological deficits.Symmetric 5 x 5 power. Extremities: No edema, no cyanosis, pulses intact and symmetrical. Psychiatry: Judgement and insight appear impaired.   DVT prophylaxis:.  SCDs. Code Status: Full Family Communication: No family at bedside. Disposition Plan: Fecal disposition.  He was evaluated by a group home today and they refused to take him due to complex medical needs. Social worker continue to look for a place for him.  Consultants:   Psychiatry  Urology  Procedures:  Antimicrobials:  Ampicillin  Data Reviewed: I have  personally reviewed following labs and imaging studies  CBC: Recent Labs  Lab 05/15/19 0059 05/15/19 0641 05/16/19 0555 05/17/19 0459 05/19/19 0508  WBC 10.6*  --  6.8 5.5 5.9  HGB 8.7* 9.7* 9.3* 9.3* 9.0*  HCT 25.5*  25.0* 28.2* 27.1* 27.3* 25.9*  MCV 89.2  --  89.1 88.6 86.9  PLT 102*  --  88* 98* 123XX123*   Basic Metabolic Panel: Recent Labs  Lab 05/15/19 0059 05/16/19 0555 05/19/19 0508 05/20/19 0610  NA  --  142 143 143  K  --  3.4* 3.2* 3.6  CL  --  111 105 103  CO2  --  24 31 33*  GLUCOSE  --  90 86 121*  BUN  --  39* 23 23  CREATININE 1.54* 1.08 0.95 0.85  CALCIUM  --  8.2* 8.8* 8.9  MG  --   --  1.5* 1.7   GFR: Estimated Creatinine Clearance: 91.3 mL/min (by C-G formula based on SCr of 0.85 mg/dL). Liver Function Tests: No results for input(s): AST, ALT, ALKPHOS, BILITOT, PROT, ALBUMIN in the last 168 hours. No results for input(s): LIPASE, AMYLASE in the last 168 hours. No results for input(s): AMMONIA in the last 168 hours. Coagulation Profile: No results for input(s): INR, PROTIME in the last 168 hours. Cardiac Enzymes: No results for input(s): CKTOTAL, CKMB, CKMBINDEX, TROPONINI in the last 168 hours. BNP (last 3 results) No results for input(s): PROBNP in the last 8760 hours. HbA1C: No results for input(s): HGBA1C in the last 72 hours. CBG: Recent Labs  Lab 05/18/19 1911  GLUCAP 79   Lipid Profile: No results for input(s): CHOL, HDL, LDLCALC, TRIG, CHOLHDL, LDLDIRECT in the last 72 hours. Thyroid Function Tests: No results for input(s): TSH, T4TOTAL, FREET4, T3FREE, THYROIDAB in the last 72 hours. Anemia Panel: No results for input(s): VITAMINB12, FOLATE, FERRITIN, TIBC, IRON, RETICCTPCT in the last 72 hours. Sepsis Labs: No results for input(s): PROCALCITON, LATICACIDVEN in the last 168 hours.  Recent Results (from the past 240 hour(s))  CULTURE, BLOOD (ROUTINE X 2) w Reflex to ID Panel     Status: Abnormal   Collection Time: 05/14/19  1:33  PM   Specimen: BLOOD  Result Value Ref [place] Status   Specimen Description   Final    BLOOD BLOOD RIGHT HAND Performed at [facility], [address] [place], [place] [address]    Special Requests   Final    BOTTLES DRAWN AEROBIC AND ANAEROBIC Blood Culture adequate volume Performed at [facility], [address] [place], [place] [address]    Culture  Setup Time   Final    IN BOTH AEROBIC AND ANAEROBIC BOTTLES GRAM POSITIVE COCCI CRITICAL RESULT CALLED TO, READ BACK BY AND VERIFIED WITH: [place] ON 05/15/19 AT 0149 [facility] Performed at [facility], [address], [place], [place] [address]    Culture (A)  Final    ENTEROCOCCUS FAECALIS SUSCEPTIBILITIES PERFORMED ON PREVIOUS CULTURE WITHIN THE LAST 5 DAYS. Performed at [facility], [place] [address], [place], [place] [address]    Report Status 05/17/2019 FINAL  Final  CULTURE, BLOOD (ROUTINE X 2) w Reflex to ID Panel     Status: Abnormal   Collection Time: 05/14/19  1:35 PM   Specimen: BLOOD  Result Value Ref Range Status   Specimen Description   Final    BLOOD RIGHT ANTECUBITAL Performed at [facility], [place] [address], [place], [place] [address]    Special Requests   Final    BOTTLES DRAWN AEROBIC AND ANAEROBIC Blood Culture adequate volume Performed at [facility], [address] [place], [place] [address]    Culture  Setup Time   Final    Organism ID to follow IN BOTH AEROBIC AND ANAEROBIC BOTTLES GRAM POSITIVE COCCI IN PAIRS CRITICAL VALUE NOTED.  VALUE IS CONSISTENT WITH PREVIOUSLY REPORTED AND CALLED VALUE. Performed at [facility], [address] [place], [place] [address]    Culture ENTEROCOCCUS FAECALIS (A)  Final   Report Status 05/17/2019 FINAL  Final   Organism ID, Bacteria ENTEROCOCCUS FAECALIS  Final      Susceptibility   Enterococcus faecalis - MIC*    AMPICILLIN <=2 SENSITIVE Sensitive     VANCOMYCIN 1 SENSITIVE Sensitive     GENTAMICIN  SYNERGY SENSITIVE Sensitive     * ENTEROCOCCUS FAECALIS  Blood Culture ID Panel (Reflexed)     Status: Abnormal   Collection Time: 05/14/19  1:35 PM  Result Value Ref Range Status   Enterococcus species DETECTED (A) NOT DETECTED Final    Comment: CRITICAL RESULT CALLED TO, READ BACK BY AND VERIFIED WITH: [place] ON 05/15/19 AT 0604 [facility]    Vancomycin resistance NOT DETECTED NOT DETECTED Final   Listeria monocytogenes NOT DETECTED NOT DETECTED Final   Staphylococcus species NOT DETECTED NOT DETECTED Final   Staphylococcus aureus (BCID) NOT DETECTED NOT DETECTED Final   Streptococcus species NOT DETECTED NOT DETECTED Final   Streptococcus agalactiae NOT DETECTED NOT DETECTED Final   Streptococcus pneumoniae  NOT DETECTED NOT DETECTED Final   Streptococcus pyogenes NOT DETECTED NOT DETECTED Final   Acinetobacter baumannii NOT DETECTED NOT DETECTED Final   Enterobacteriaceae species NOT DETECTED NOT DETECTED Final   Enterobacter cloacae complex NOT DETECTED NOT DETECTED Final   Escherichia coli NOT DETECTED NOT DETECTED Final   Klebsiella oxytoca NOT DETECTED NOT DETECTED Final   Klebsiella pneumoniae NOT DETECTED NOT DETECTED Final   Proteus species NOT DETECTED NOT DETECTED Final   Serratia marcescens NOT DETECTED NOT DETECTED Final   Haemophilus influenzae NOT DETECTED NOT DETECTED Final   Neisseria meningitidis NOT DETECTED NOT DETECTED Final   Pseudomonas aeruginosa NOT DETECTED NOT DETECTED Final   Candida albicans NOT DETECTED NOT DETECTED Final   Candida glabrata NOT DETECTED NOT DETECTED Final   Candida krusei NOT DETECTED NOT DETECTED Final   Candida parapsilosis NOT DETECTED NOT DETECTED Final   Candida tropicalis NOT DETECTED NOT DETECTED Final    Comment: Performed at [facility], [address], [place], [place] [address]  Urine Culture     Status: Abnormal   Collection Time: 05/14/19  1:51 PM   Specimen: Urine, Catheterized  Result Value Ref Range Status    Specimen Description   Final    URINE, CATHETERIZED Performed at [facility], [address], [place], [place] [address]    Special Requests   Final    Normal Performed at [facility], [address] [place], [place] [address]    Culture >=100,000 COLONIES/mL ENTEROCOCCUS FAECALIS (A)  Final   Report Status 05/16/2019 FINAL  Final   Organism ID, Bacteria ENTEROCOCCUS FAECALIS (A)  Final      Susceptibility   Enterococcus faecalis - MIC*    AMPICILLIN <=2 SENSITIVE Sensitive     NITROFURANTOIN <=16 SENSITIVE Sensitive     VANCOMYCIN 1 SENSITIVE Sensitive     * >=100,000 COLONIES/mL ENTEROCOCCUS FAECALIS  MRSA PCR Screening     Status: None   Collection Time: 05/14/19  9:14 PM   Specimen: Nasal Mucosa; Nasopharyngeal  Result Value Ref Range Status   MRSA by PCR NEGATIVE NEGATIVE Final    Comment:        The GeneXpert MRSA Assay (FDA approved for NASAL specimens only), is one component of a comprehensive MRSA colonization surveillance program. It is not intended to diagnose MRSA infection nor to guide or monitor treatment for MRSA infections. Performed at [facility], [address] [place], [place] [address]   Culture, blood (routine x 2)     Status: None   Collection Time: 05/15/19 11:23 AM   Specimen: BLOOD  Result Value Ref Range Status   Specimen Description BLOOD RIGHT [facility]  Final   Special Requests BLOOD Blood Culture adequate volume  Final   Culture   Final    NO GROWTH 5 DAYS Performed at [facility], [address] [place], [place] [address]    Report Status 05/20/2019 FINAL  Final  Culture, blood (routine x 2)     Status: None   Collection Time: 05/15/19 12:47 PM   Specimen: BLOOD  Result Value Ref Range Status   Specimen Description BLOOD BLRA  Final   Special Requests   Final    BOTTLES DRAWN AEROBIC AND ANAEROBIC Blood Culture results may not be optimal due to an excessive volume of blood received in culture  bottles   Culture   Final    NO GROWTH 5 DAYS Performed at [facility], [address],  [place], [place] [address]    Report Status 05/20/2019 FINAL  Final     Radiology Studies: No results found.  Scheduled Meds: . atorvastatin  10 mg Oral Daily  . chlorhexidine  15 mL Mouth/Throat BID  . Chlorhexidine Gluconate Cloth  6 each Topical Daily  . escitalopram  10 mg Oral Daily  . feeding supplement (NEPRO CARB STEADY)  237 mL Oral TID BM  . feeding supplement (PRO-STAT SUGAR FREE 64)  30 mL Oral BID  . gabapentin  300 mg Oral TID  . mirtazapine  45 mg Oral QHS  . multivitamin with minerals  1 tablet Oral Daily  . nystatin cream   Topical BID  . risperiDONE  3 mg Oral BID  . senna-docusate  2 tablet Oral BID  . sodium chloride flush  10-40 mL Intracatheter Q12H  . tamsulosin  0.4 mg Oral Daily  . traZODone  150 mg Oral QHS   Continuous Infusions: . sodium chloride Stopped (05/19/19 1106)  . ampicillin (OMNIPEN) IV 2 g (05/21/19 0933)     LOS: 53 days   Time spent: 35 Minutes.  Lorella Nimrod, MD [facility]  If 7PM-7AM, please contact night-coverage [email or web address]  05/21/2019, 4:31 PM   This record has been created using Systems analyst. Errors have been sought and corrected,but may not always be located. Such creation errors do not reflect on the standard of care.

## 2019-05-22 DIAGNOSIS — R7881 Bacteremia: Secondary | ICD-10-CM | POA: Diagnosis not present

## 2019-05-22 DIAGNOSIS — B952 Enterococcus as the cause of diseases classified elsewhere: Secondary | ICD-10-CM | POA: Diagnosis not present

## 2019-05-22 NOTE — Progress Notes (Signed)
PROGRESS NOTE    Peter Parsons  [id number] DOB: 1960-11-12 DOA: 02/11/2019 PCP: System, Provider Not In   Brief Narrative:  59 year old male with chronic schizophrenia with frequent suicidal ideations, bipolar disorder with depression, type 2 diabetes mellitus (controlled), CKD stage II, peripheral neuropathy, BPH, hypertension presented with altered mental status.  Prolonged hospital stay awaiting placement in a memory care unit.  Does not have capacity.  Hospital course/significant events 3/31: Prolonged hospital stay of almost 6 weeks awaiting for placement.  Foley discontinued after prolonged use in the hospital (suspected was placed due to bladder outlet obstruction).  Intermittent bladder scan showing urinary retention. 4/1: Foley placed in due to urinary retention noted on repeat bladder scans.  Renal ultrasound done showed distended bladder with Foley not in the bladder. 4/2: Patient found to have gross hematuria with clots and Foley removed.  Urology consulted and a 26 French coud catheter placed.  Patient became septic with hypotension, fever and tachycardia.  Received 5 L normal saline bolus with minimal improvement and transferred to stepdown unit.  Started on Neo-Synephrine through peripheral IV and given 1 unit PRBC for acute blood loss anemia. 4/3: Stable off pressors and transferred back to medical floor.  Subjective: Patient was comfortable today.  No new complaints.  Assessment & Plan:   Principal Problem:   Enterococcal bacteremia Active Problems:   Paranoid schizophrenia ([place])   Altered mental status   Dementia ([place])   Weakness   Acute metabolic encephalopathy   Protein-calorie malnutrition, severe   Pressure injury of skin   Hypertension   Type 2 diabetes mellitus (HCC)   Dyslipidemia   BPH (benign prostatic hyperplasia)   Normocytic anemia   Thrombocytopenia (H[facility])   Acute urinary retention   Urethral trauma   UTI (urinary tract infection)  Severe  sepsis with septic shock.  Resolved. Secondary to catheter associated UTI. Patient had multiple straight caths done past few days and Foley placed in on 4/1. Transferred to stepdown unit and multiple IV normal saline bolus received.  Required Neo-Synephrine overnight and now stable off pressors.  Sepsis resolved. Blood and urine culture growing Enterococcus faecalis. Antibiotic narrowed to ampicillin according to sensitivity results.  Constipation. -Regular bowel regimen.  Fungal rash on his face after shaving of beard.  -Try keeping that area dry. -Nystatin cream  Hypokalemia/hypomagnesemia.  Patient has potassium of 3.2 with magnesium of 1.5. -Replete electrolytes and monitor.  Gross hematuria Secondary to traumatic catheterization.  Foley removed and urology placed in a 20 [place] coud Foley without resistance.  Recommend intermittent irrigation, keep Foley upon discharge and follow-up with urology in [place]. Urine now clear.  Received 1 unit PRBC for drop in H&H.  BPH with urinary retention Continue Foley-Foley was placed by urology after having a traumatic urethral injury and they advised to keep the Foley in until he will follow-up with them as an outpatient in 2 weeks. -Allergy was consulted again as he remains in the hospital-to keep the catheter till Friday and then they will see him for a voiding trial.  Did not see any note from urology yet. Continue Flomax.  Chronic schizophrenia Continue paliperidone, Klonopin, mirtazapine and risperidone.  History of frequent suicidal ideation.  Does not have capacity.  Outpatient psychiatry follow-up.  Depression with bipolar disorder Continue Lexapro and trazodone.  Acute on chronic kidney disease stage II Likely ATN with sepsis + bladder outlet obstruction.  Monitor with IV fluids.  Type 2 diabetes mellitus, controlled A1c of 4.5.  CBG stable.  Thrombocytopenia  Unclear etiology.  Platelet stable.  Discontinued  ibuprofen.  Peripheral neuropathy Continue gabapentin  Hypertension/ hyperlipidemia. BP within goal. Off all blood pressure meds. Continue statin.  Dysphagia/dysarthria and left facial droop MRI negative for CVA.  Continue dysphagia diet  Severe protein calorie malnutrition Continue nutrition supplement and Remeron  Failure to thrive PT recommends home health with 24-hour supervision.  Awaiting placement.  Objective: Vitals:   05/21/19 0012 05/21/19 0804 05/22/19 0010 05/22/19 0753  BP: 123/72 107/65 111/82 105/71  Pulse: 78 (!) 59 88 (!) 57  Resp: 19 15 15 14   Temp: 98 F (36.7 C) 98.1 F (36.7 C) 97.6 F (36.4 C) 98.7 F (37.1 C)  TempSrc:      SpO2: 97% 97% 95% 96%  Weight:      Height:        Intake/Output Summary (Last 24 hours) at 05/22/2019 1417 Last data filed at 05/22/2019 0413 Gross per 24 hour  Intake 1860 ml  Output 1800 ml  Net 60 ml   Filed Weights   05/16/19 0548 05/17/19 0400 05/18/19 0532  Weight: 85.2 kg 84.4 kg 81.6 kg    Examination:  General exam: Chronically ill-appearing gentleman, appears calm and comfortable  Respiratory system: Clear to auscultation. Respiratory effort normal. Cardiovascular system: S1 & S2 heard, RRR. No JVD, murmurs, rubs, gallops or clicks. Gastrointestinal system: Soft, nontender, nondistended, bowel sounds positive. Central nervous system: Alert and oriented. No focal neurological deficits.Symmetric 5 x 5 power. Extremities: No edema, no cyanosis, pulses intact and symmetrical. Psychiatry: Judgement and insight appear impaired.   DVT prophylaxis:.  SCDs. Code Status: Full Family Communication: No family at bedside. Disposition Plan: Difficult disposition.  He was evaluated by a group home  and they refused to take him due to complex medical needs. Social worker continue to look for a place for him.  Consultants:   Psychiatry  Urology  Procedures:  Antimicrobials:  Ampicillin  Data Reviewed: I  have personally reviewed following labs and imaging studies  CBC: Recent Labs  Lab 05/16/19 0555 05/17/19 0459 05/19/19 0508  WBC 6.8 5.5 5.9  HGB 9.3* 9.3* 9.0*  HCT 27.1* 27.3* 25.9*  MCV 89.1 88.6 86.9  PLT 88* 98* 123XX123*   Basic Metabolic Panel: Recent Labs  Lab 05/16/19 0555 05/19/19 0508 05/20/19 0610  NA 142 143 143  K 3.4* 3.2* 3.6  CL 111 105 103  CO2 24 31 33*  GLUCOSE 90 86 121*  BUN 39* 23 23  CREATININE 1.08 0.95 0.85  CALCIUM 8.2* 8.8* 8.9  MG  --  1.5* 1.7   GFR: Estimated Creatinine Clearance: 91.3 mL/min (by C-G formula based on SCr of 0.85 mg/dL). Liver Function Tests: No results for input(s): AST, ALT, ALKPHOS, BILITOT, PROT, ALBUMIN in the last 168 hours. No results for input(s): LIPASE, AMYLASE in the last 168 hours. No results for input(s): AMMONIA in the last 168 hours. Coagulation Profile: No results for input(s): INR, PROTIME in the last 168 hours. Cardiac Enzymes: No results for input(s): CKTOTAL, CKMB, CKMBINDEX, TROPONINI in the last 168 hours. BNP (last 3 results) No results for input(s): PROBNP in the last 8760 hours. HbA1C: No results for input(s): HGBA1C in the last 72 hours. CBG: Recent Labs  Lab 05/18/19 1911  GLUCAP 79   Lipid Profile: No results for input(s): CHOL, HDL, LDLCALC, TRIG, CHOLHDL, LDLDIRECT in the last 72 hours. Thyroid Function Tests: No results for input(s): TSH, T4TOTAL, FREET4, T3FREE, THYROIDAB in the last 72 hours. Anemia Panel: No results  for input(s): VITAMINB12, FOLATE, FERRITIN, TIBC, IRON, RETICCTPCT in the last 72 hours. Sepsis Labs: No results for input(s): PROCALCITON, LATICACIDVEN in the last 168 hours.  Recent Results (from the past 240 hour(s))  CULTURE, BLOOD (ROUTINE X 2) w Reflex to ID Panel     Status: Abnormal   Collection Time: 05/14/19  1:33 PM   Specimen: BLOOD  Result Value Ref Range Status   Specimen Description   Final    BLOOD BLOOD RIGHT HAND Performed at [facility], [address] [place], [place] [address]    Special Requests   Final    BOTTLES DRAWN AEROBIC AND ANAEROBIC Blood Culture adequate volume Performed at [facility], [address] [name], [place] [address]    Culture  Setup Time   Final    IN BOTH AEROBIC AND ANAEROBIC BOTTLES GRAM POSITIVE COCCI CRITICAL RESULT CALLED TO, READ BACK BY AND VERIFIED WITH: Kimball ON 05/15/19 AT 0149 [facility] Performed at [facility], [address], [place], [place] [address]    Culture (A)  Final    ENTEROCOCCUS FAECALIS SUSCEPTIBILITIES PERFORMED ON PREVIOUS CULTURE WITHIN THE LAST 5 DAYS. Performed at [facility], [place] [address] [place], [place] [address]    Report Status 05/17/2019 FINAL  Final  CULTURE, BLOOD (ROUTINE X 2) w Reflex to ID Panel     Status: Abnormal   Collection Time: 05/14/19  1:35 PM   Specimen: BLOOD  Result Value Ref Range Status   Specimen Description   Final    BLOOD RIGHT ANTECUBITAL Performed at [facility], [place] [address], [place], [place] [address]    Special Requests   Final    BOTTLES DRAWN AEROBIC AND ANAEROBIC Blood Culture adequate volume Performed at [facility], [address] [place], [place] [address]    Culture  Setup Time   Final    Organism ID to follow IN BOTH AEROBIC AND ANAEROBIC BOTTLES GRAM POSITIVE COCCI IN PAIRS CRITICAL VALUE NOTED.  VALUE IS CONSISTENT WITH PREVIOUSLY REPORTED AND CALLED VALUE. Performed at [facility], [address] [place], [place] [address]    Culture ENTEROCOCCUS FAECALIS (A)  Final   Report Status 05/17/2019 FINAL  Final   Organism ID, Bacteria ENTEROCOCCUS FAECALIS  Final      Susceptibility   Enterococcus faecalis - MIC*    AMPICILLIN <=2 SENSITIVE Sensitive     VANCOMYCIN 1 SENSITIVE Sensitive     GENTAMICIN SYNERGY SENSITIVE Sensitive     * ENTEROCOCCUS FAECALIS  Blood Culture ID Panel (Reflexed)     Status: Abnormal   Collection Time: 05/14/19   1:35 PM  Result Value Ref Range Status   Enterococcus species DETECTED (A) NOT DETECTED Final    Comment: CRITICAL RESULT CALLED TO, READ BACK BY AND VERIFIED WITH: SCOTT HALL ON 05/15/19 AT 0604 [facility]    Vancomycin resistance NOT DETECTED NOT DETECTED Final   Listeria monocytogenes NOT DETECTED NOT DETECTED Final   Staphylococcus species NOT DETECTED NOT DETECTED Final   Staphylococcus aureus (BCID) NOT DETECTED NOT DETECTED Final   Streptococcus species NOT DETECTED NOT DETECTED Final   Streptococcus agalactiae NOT DETECTED NOT DETECTED Final   Streptococcus pneumoniae NOT DETECTED NOT DETECTED Final   Streptococcus pyogenes NOT DETECTED NOT DETECTED Final   Acinetobacter baumannii NOT DETECTED NOT DETECTED Final   Enterobacteriaceae species NOT DETECTED NOT DETECTED Final   Enterobacter cloacae complex NOT DETECTED NOT DETECTED Final   Escherichia coli  NOT DETECTED NOT DETECTED Final   Klebsiella oxytoca NOT DETECTED NOT DETECTED Final   Klebsiella pneumoniae NOT DETECTED NOT DETECTED Final   Proteus species NOT DETECTED NOT DETECTED Final   Serratia marcescens NOT DETECTED NOT DETECTED Final   Haemophilus influenzae NOT DETECTED NOT DETECTED Final   Neisseria meningitidis NOT DETECTED NOT DETECTED Final   Pseudomonas aeruginosa NOT DETECTED NOT DETECTED Final   Candida albicans NOT DETECTED NOT DETECTED Final   Candida glabrata NOT DETECTED NOT DETECTED Final   Candida krusei NOT DETECTED NOT DETECTED Final   Candida parapsilosis NOT DETECTED NOT DETECTED Final   Candida tropicalis NOT DETECTED NOT DETECTED Final    Comment: Performed at [facility], [address] [place], [place] [address]  Urine Culture     Status: Abnormal   Collection Time: 05/14/19  1:51 PM   Specimen: Urine, Catheterized  Result Value Ref Range Status   Specimen Description   Final    URINE, CATHETERIZED Performed at [facility], [address], [place], [place] [address]     Special Requests   Final    Normal Performed at [facility], [address] [place], [place] [address]    Culture >=100,000 COLONIES/mL ENTEROCOCCUS FAECALIS (A)  Final   Report Status 05/16/2019 FINAL  Final   Organism ID, Bacteria ENTEROCOCCUS FAECALIS (A)  Final      Susceptibility   Enterococcus faecalis - MIC*    AMPICILLIN <=2 SENSITIVE Sensitive     NITROFURANTOIN <=16 SENSITIVE Sensitive     VANCOMYCIN 1 SENSITIVE Sensitive     * >=100,000 COLONIES/mL ENTEROCOCCUS FAECALIS  MRSA PCR Screening     Status: None   Collection Time: 05/14/19  9:14 PM   Specimen: Nasal Mucosa; Nasopharyngeal  Result Value Ref Range Status   MRSA by PCR NEGATIVE NEGATIVE Final    Comment:        The GeneXpert MRSA Assay (FDA approved for NASAL specimens only), is one component of a comprehensive MRSA colonization surveillance program. It is not intended to diagnose MRSA infection nor to guide or monitor treatment for MRSA infections. Performed at [facility], [address] [place], [place] [address]   Culture, blood (routine x 2)     Status: None   Collection Time: 05/15/19 11:23 AM   Specimen: BLOOD  Result Value Ref Range Status   Specimen Description BLOOD RIGHT [facility]  Final   Special Requests BLOOD Blood Culture adequate volume  Final   Culture   Final    NO GROWTH 5 DAYS Performed at [facility], [address], [place], [place] [address]    Report Status 05/20/2019 FINAL  Final  Culture, blood (routine x 2)     Status: None   Collection Time: 05/15/19 12:47 PM   Specimen: BLOOD  Result Value Ref Range Status   Specimen Description BLOOD BLRA  Final   Special Requests   Final    BOTTLES DRAWN AEROBIC AND ANAEROBIC Blood Culture results may not be optimal due to an excessive volume of blood received in culture bottles   Culture   Final    NO GROWTH 5 DAYS Performed at [facility], [address], [place], [place] [address]    Report  Status 05/20/2019 FINAL  Final     Radiology Studies: No results found.  Scheduled Meds: . atorvastatin  10 mg Oral Daily  . chlorhexidine  15 mL Mouth/Throat BID  . Chlorhexidine Gluconate Cloth  6  each Topical Daily  . escitalopram  10 mg Oral Daily  . feeding supplement (NEPRO CARB STEADY)  237 mL Oral TID BM  . feeding supplement (PRO-STAT SUGAR FREE 64)  30 mL Oral BID  . gabapentin  300 mg Oral TID  . mirtazapine  45 mg Oral QHS  . multivitamin with minerals  1 tablet Oral Daily  . nystatin cream   Topical BID  . risperiDONE  3 mg Oral BID  . senna-docusate  2 tablet Oral BID  . sodium chloride flush  10-40 mL Intracatheter Q12H  . tamsulosin  0.4 mg Oral Daily  . traZODone  150 mg Oral QHS   Continuous Infusions: . sodium chloride Stopped (05/19/19 1106)  . ampicillin (OMNIPEN) IV 2 g (05/22/19 0953)     LOS: 54 days   Time spent: 35 Minutes.  Lorella Nimrod, MD [facility]  If 7PM-7AM, please contact night-coverage [email or web address]  05/22/2019, 2:17 PM   This record has been created using Systems analyst. Errors have been sought and corrected,but may not always be located. Such creation errors do not reflect on the standard of care.

## 2019-05-23 DIAGNOSIS — R7881 Bacteremia: Secondary | ICD-10-CM | POA: Diagnosis not present

## 2019-05-23 DIAGNOSIS — B952 Enterococcus as the cause of diseases classified elsewhere: Secondary | ICD-10-CM | POA: Diagnosis not present

## 2019-05-23 NOTE — Progress Notes (Signed)
PROGRESS NOTE    Peter Parsons  [id number] DOB: Jun 26, 1960 DOA: 02/11/2019 PCP: System, Provider Not In   Brief Narrative:  59 year old male with chronic schizophrenia with frequent suicidal ideations, bipolar disorder with depression, type 2 diabetes mellitus (controlled), CKD stage II, peripheral neuropathy, BPH, hypertension presented with altered mental status.  Prolonged hospital stay awaiting placement in a memory care unit.  Does not have capacity.  Hospital course/significant events 3/31: Prolonged hospital stay of almost 6 weeks awaiting for placement.  Foley discontinued after prolonged use in the hospital (suspected was placed due to bladder outlet obstruction).  Intermittent bladder scan showing urinary retention. 4/1: Foley placed in due to urinary retention noted on repeat bladder scans.  Renal ultrasound done showed distended bladder with Foley not in the bladder. 4/2: Patient found to have gross hematuria with clots and Foley removed.  Urology consulted and a 12 French coud catheter placed.  Patient became septic with hypotension, fever and tachycardia.  Received 5 L normal saline bolus with minimal improvement and transferred to stepdown unit.  Started on Neo-Synephrine through peripheral IV and given 1 unit PRBC for acute blood loss anemia. 4/3: Stable off pressors and transferred back to medical floor.  Subjective: Patient staying that I am not good today because no matter what I do I cannot fix myself.  Denies any pain.  Assessment & Plan:   Principal Problem:   Enterococcal bacteremia Active Problems:   Paranoid schizophrenia ([place])   Altered mental status   Dementia ([place])   Weakness   Acute metabolic encephalopathy   Protein-calorie malnutrition, severe   Pressure injury of skin   Hypertension   Type 2 diabetes mellitus (HCC)   Dyslipidemia   BPH (benign prostatic hyperplasia)   Normocytic anemia   Thrombocytopenia (HCC)   Acute urinary retention  Urethral trauma   UTI (urinary tract infection)  Severe sepsis with septic shock.  Resolved. Secondary to catheter associated UTI. Patient had multiple straight caths done past few days and Foley placed in on 4/1. Transferred to stepdown unit and multiple IV normal saline bolus received.  Required Neo-Synephrine overnight and now stable off pressors.  Sepsis resolved. Blood and urine culture growing Enterococcus faecalis. Antibiotic narrowed to ampicillin according to sensitivity results.  Constipation. -Regular bowel regimen.  Fungal rash on his face after shaving of beard.  -Try keeping that area dry. -Nystatin cream  Hypokalemia/hypomagnesemia.  Resolved -Replete electrolytes as needed and monitor.  Gross hematuria Secondary to traumatic catheterization.  Foley removed and urology placed in a 20 [place] coud Foley without resistance.  Recommend intermittent irrigation, keep Foley upon discharge and follow-up with urology in [employer]. Urine now clear.  Received 1 unit PRBC for drop in H&H.  BPH with urinary retention Continue Foley-Foley was placed by urology after having a traumatic urethral injury and they advised to keep the Foley in until he will follow-up with them as an outpatient in 2 weeks. -Urology was consulted again as he remains in the hospital-to keep the catheter till Friday and then they will see him for a voiding trial.  Did not see any note from urology yet. -We will contact urology again tomorrow and will try giving him a voiding trial. Continue Flomax.  Chronic schizophrenia Continue paliperidone, Klonopin, mirtazapine and risperidone.  History of frequent suicidal ideation.  Does not have capacity.  Outpatient psychiatry follow-up.  Depression with bipolar disorder Continue Lexapro and trazodone.  Acute on chronic kidney disease stage II Likely ATN with sepsis + bladder outlet  obstruction.  Monitor with IV fluids.  Type 2 diabetes mellitus,  controlled A1c of 4.5.  CBG stable.  Thrombocytopenia Unclear etiology.  Platelet stable.  Discontinued ibuprofen.  Peripheral neuropathy Continue gabapentin  Hypertension/ hyperlipidemia. BP within goal. Off all blood pressure meds. Continue statin.  Dysphagia/dysarthria and left facial droop MRI negative for CVA.  Continue dysphagia diet  Severe protein calorie malnutrition Continue nutrition supplement and Remeron  Failure to thrive PT recommends home health with 24-hour supervision.  Awaiting placement.  Objective: Vitals:   05/22/19 0753 05/22/19 1508 05/23/19 0019 05/23/19 0733  BP: 105/71 126/76 117/71 104/64  Pulse: (!) 57 86 65 (!) 58  Resp: 14 17  19   Temp: 98.7 F (37.1 C) 99.1 F (37.3 C) 99.4 F (37.4 C) 97.8 F (36.6 C)  TempSrc:      SpO2: 96% 94% 96% 96%  Weight:      Height:        Intake/Output Summary (Last 24 hours) at 05/23/2019 1513 Last data filed at 05/23/2019 [id number] Gross per 24 hour  Intake 200 ml  Output 2250 ml  Net -2050 ml   Filed Weights   05/16/19 0548 05/17/19 0400 05/18/19 0532  Weight: 85.2 kg 84.4 kg 81.6 kg    Examination:  General exam: Chronically ill-appearing gentleman, appears calm and comfortable  Respiratory system: Clear to auscultation. Respiratory effort normal. Cardiovascular system: S1 & S2 heard, RRR. No JVD, murmurs, rubs, gallops or clicks. Gastrointestinal system: Soft, nontender, nondistended, bowel sounds positive. Central nervous system: Alert and oriented. No focal neurological deficits.Symmetric 5 x 5 power. Extremities: No edema, no cyanosis, pulses intact and symmetrical. Psychiatry: Judgement and insight appear impaired.   DVT prophylaxis:.  SCDs. Code Status: Full Family Communication: No family at bedside. Disposition Plan: Difficult disposition.  He was evaluated by a group home  and they refused to take him due to complex medical needs. Social worker continue to look for a place for  him.  Consultants:   Psychiatry  Urology  Procedures:  Antimicrobials:  Ampicillin  Data Reviewed: I have personally reviewed following labs and imaging studies  CBC: Recent Labs  Lab 05/17/19 0459 05/19/19 0508  WBC 5.5 5.9  HGB 9.3* 9.0*  HCT 27.3* 25.9*  MCV 88.6 86.9  PLT 98* [id number]*   Basic Metabolic Panel: Recent Labs  Lab 05/19/19 0508 05/20/19 0610  NA 143 143  K 3.2* 3.6  CL 105 103  CO2 31 33*  GLUCOSE 86 121*  BUN 23 23  CREATININE 0.95 0.85  CALCIUM 8.8* 8.9  MG 1.5* 1.7   GFR: Estimated Creatinine Clearance: 91.3 mL/min (by C-G formula based on SCr of 0.85 mg/dL). Liver Function Tests: No results for input(s): AST, ALT, ALKPHOS, BILITOT, PROT, ALBUMIN in the last 168 hours. No results for input(s): LIPASE, AMYLASE in the last 168 hours. No results for input(s): AMMONIA in the last 168 hours. Coagulation Profile: No results for input(s): INR, PROTIME in the last 168 hours. Cardiac Enzymes: No results for input(s): CKTOTAL, CKMB, CKMBINDEX, TROPONINI in the last 168 hours. BNP (last 3 results) No results for input(s): PROBNP in the last 8760 hours. HbA1C: No results for input(s): HGBA1C in the last 72 hours. CBG: Recent Labs  Lab 05/18/19 1911  GLUCAP 79   Lipid Profile: No results for input(s): CHOL, HDL, LDLCALC, TRIG, CHOLHDL, LDLDIRECT in the last 72 hours. Thyroid Function Tests: No results for input(s): TSH, T4TOTAL, FREET4, T3FREE, THYROIDAB in the last 72 hours. Anemia Panel: No results  for input(s): VITAMINB12, FOLATE, FERRITIN, TIBC, IRON, RETICCTPCT in the last 72 hours. Sepsis Labs: No results for input(s): PROCALCITON, LATICACIDVEN in the last 168 hours.  Recent Results (from the past 240 hour(s))  CULTURE, BLOOD (ROUTINE X 2) w Reflex to ID Panel     Status: Abnormal   Collection Time: 05/14/19  1:33 PM   Specimen: BLOOD  Result Value Ref Range Status   Specimen Description   Final    BLOOD BLOOD RIGHT HAND Performed at  [facility], [address], [place], [place] [address]    Special Requests   Final    BOTTLES DRAWN AEROBIC AND ANAEROBIC Blood Culture adequate volume Performed at [facility], [address] [place], [place] [address]    Culture  Setup Time   Final    IN BOTH AEROBIC AND ANAEROBIC BOTTLES GRAM POSITIVE COCCI CRITICAL RESULT CALLED TO, READ BACK BY AND VERIFIED WITH: [place] ON 05/15/19 AT 0149 [facility] Performed at [facility], [address] [place], [place] [address]    Culture (A)  Final    ENTEROCOCCUS FAECALIS SUSCEPTIBILITIES PERFORMED ON PREVIOUS CULTURE WITHIN THE LAST 5 DAYS. Performed at [facility], [place] [address], [place], [place] [address]    Report Status 05/17/2019 FINAL  Final  CULTURE, BLOOD (ROUTINE X 2) w Reflex to ID Panel     Status: Abnormal   Collection Time: 05/14/19  1:35 PM   Specimen: BLOOD  Result Value Ref Range Status   Specimen Description   Final    BLOOD RIGHT ANTECUBITAL Performed at [facility], [place] [address], [place], [place] [address]    Special Requests   Final    BOTTLES DRAWN AEROBIC AND ANAEROBIC Blood Culture adequate volume Performed at [facility], [address] [place], [place] [address]    Culture  Setup Time   Final    Organism ID to follow IN BOTH AEROBIC AND ANAEROBIC BOTTLES GRAM POSITIVE COCCI IN PAIRS CRITICAL VALUE NOTED.  VALUE IS CONSISTENT WITH PREVIOUSLY REPORTED AND CALLED VALUE. Performed at [facility], [address] [place], [place] [address]    Culture ENTEROCOCCUS FAECALIS (A)  Final   Report Status 05/17/2019 FINAL  Final   Organism ID, Bacteria ENTEROCOCCUS FAECALIS  Final      Susceptibility   Enterococcus faecalis - MIC*    AMPICILLIN <=2 SENSITIVE Sensitive     VANCOMYCIN 1 SENSITIVE Sensitive     GENTAMICIN SYNERGY SENSITIVE Sensitive     * ENTEROCOCCUS FAECALIS  Blood Culture ID Panel (Reflexed)     Status: Abnormal    Collection Time: 05/14/19  1:35 PM  Result Value Ref Range Status   Enterococcus species DETECTED (A) NOT DETECTED Final    Comment: CRITICAL RESULT CALLED TO, READ BACK BY AND VERIFIED WITH: SCOTT HALL ON 05/15/19 AT 0604 [facility]    Vancomycin resistance NOT DETECTED NOT DETECTED Final   Listeria monocytogenes NOT DETECTED NOT DETECTED Final   Staphylococcus species NOT DETECTED NOT DETECTED Final   Staphylococcus aureus (BCID) NOT DETECTED NOT DETECTED Final   Streptococcus species NOT DETECTED NOT DETECTED Final   Streptococcus agalactiae NOT DETECTED NOT DETECTED Final   Streptococcus pneumoniae NOT DETECTED NOT DETECTED Final   Streptococcus pyogenes NOT DETECTED NOT DETECTED Final   Acinetobacter baumannii NOT DETECTED NOT DETECTED Final   Enterobacteriaceae species NOT DETECTED NOT DETECTED Final   Enterobacter cloacae complex NOT DETECTED NOT DETECTED Final   Escherichia coli  NOT DETECTED NOT DETECTED Final   Klebsiella oxytoca NOT DETECTED NOT DETECTED Final   Klebsiella pneumoniae NOT DETECTED NOT DETECTED Final   Proteus species NOT DETECTED NOT DETECTED Final   Serratia marcescens NOT DETECTED NOT DETECTED Final   Haemophilus influenzae NOT DETECTED NOT DETECTED Final   Neisseria meningitidis NOT DETECTED NOT DETECTED Final   Pseudomonas aeruginosa NOT DETECTED NOT DETECTED Final   Candida albicans NOT DETECTED NOT DETECTED Final   Candida glabrata NOT DETECTED NOT DETECTED Final   Candida krusei NOT DETECTED NOT DETECTED Final   Candida parapsilosis NOT DETECTED NOT DETECTED Final   Candida tropicalis NOT DETECTED NOT DETECTED Final    Comment: Performed at [facility], [address], [place], [place] [address]  Urine Culture     Status: Abnormal   Collection Time: 05/14/19  1:51 PM   Specimen: Urine, Catheterized  Result Value Ref Range Status   Specimen Description   Final    URINE, CATHETERIZED Performed at [facility], [address]  [place], [place] [address]    Special Requests   Final    Normal Performed at [facility], [address] [place], [place] [address]    Culture >=100,000 COLONIES/mL ENTEROCOCCUS FAECALIS (A)  Final   Report Status 05/16/2019 FINAL  Final   Organism ID, Bacteria ENTEROCOCCUS FAECALIS (A)  Final      Susceptibility   Enterococcus faecalis - MIC*    AMPICILLIN <=2 SENSITIVE Sensitive     NITROFURANTOIN <=16 SENSITIVE Sensitive     VANCOMYCIN 1 SENSITIVE Sensitive     * >=100,000 COLONIES/mL ENTEROCOCCUS FAECALIS  MRSA PCR Screening     Status: None   Collection Time: 05/14/19  9:14 PM   Specimen: Nasal Mucosa; Nasopharyngeal  Result Value Ref Range Status   MRSA by PCR NEGATIVE NEGATIVE Final    Comment:        The GeneXpert MRSA Assay (FDA approved for NASAL specimens only), is one component of a comprehensive MRSA colonization surveillance program. It is not intended to diagnose MRSA infection nor to guide or monitor treatment for MRSA infections. Performed at [facility], [address] [place], [place] [address]   Culture, blood (routine x 2)     Status: None   Collection Time: 05/15/19 11:23 AM   Specimen: BLOOD  Result Value Ref Range Status   Specimen Description BLOOD RIGHT [facility]  Final   Special Requests BLOOD Blood Culture adequate volume  Final   Culture   Final    NO GROWTH 5 DAYS Performed at [facility], [address] [place], [place] [address]    Report Status 05/20/2019 FINAL  Final  Culture, blood (routine x 2)     Status: None   Collection Time: 05/15/19 12:47 PM   Specimen: BLOOD  Result Value Ref Range Status   Specimen Description BLOOD BLRA  Final   Special Requests   Final    BOTTLES DRAWN AEROBIC AND ANAEROBIC Blood Culture results may not be optimal due to an excessive volume of blood received in culture bottles   Culture   Final    NO GROWTH 5 DAYS Performed at [facility], [address],  [place], [place] [address]    Report Status 05/20/2019 FINAL  Final     Radiology Studies: No results found.  Scheduled Meds: . atorvastatin  10 mg Oral Daily  . chlorhexidine  15 mL Mouth/Throat BID  . Chlorhexidine Gluconate Cloth  6  each Topical Daily  . escitalopram  10 mg Oral Daily  . feeding supplement (NEPRO CARB STEADY)  237 mL Oral TID BM  . feeding supplement (PRO-STAT SUGAR FREE 64)  30 mL Oral BID  . gabapentin  300 mg Oral TID  . mirtazapine  45 mg Oral QHS  . multivitamin with minerals  1 tablet Oral Daily  . nystatin cream   Topical BID  . risperiDONE  3 mg Oral BID  . senna-docusate  2 tablet Oral BID  . sodium chloride flush  10-40 mL Intracatheter Q12H  . tamsulosin  0.4 mg Oral Daily  . traZODone  150 mg Oral QHS   Continuous Infusions: . sodium chloride 250 mL (05/23/19 0309)  . ampicillin (OMNIPEN) IV 2 g (05/23/19 1258)     LOS: 55 days   Time spent: 35 Minutes.  Lorella Nimrod, MD [facility]  If 7PM-7AM, please contact night-coverage [email or web address]  05/23/2019, 3:13 PM   This record has been created using Systems analyst. Errors have been sought and corrected,but may not always be located. Such creation errors do not reflect on the standard of care.

## 2019-05-24 DIAGNOSIS — R7881 Bacteremia: Secondary | ICD-10-CM | POA: Diagnosis not present

## 2019-05-24 DIAGNOSIS — B952 Enterococcus as the cause of diseases classified elsewhere: Secondary | ICD-10-CM | POA: Diagnosis not present

## 2019-05-24 LAB — BASIC METABOLIC PANEL
Anion gap: 7 (ref 5–15)
BUN: 31 mg/dL — ABNORMAL HIGH (ref 8–23)
CO2: 34 mmol/L — ABNORMAL HIGH (ref 22–32)
Calcium: 8.8 mg/dL — ABNORMAL LOW (ref 8.9–10.3)
Chloride: 100 mmol/L (ref 98–111)
Creatinine, Ser: 0.94 mg/dL (ref 0.61–1.24)
GFR calc Af Amer: >60 mL/min (ref >60)
GFR calc non Af Amer: >60 mL/min (ref >60)
Glucose, Bld: 99 mg/dL (ref 70–99)
Potassium: 3.7 mmol/L (ref 3.5–5.1)
Sodium: 141 mmol/L (ref 135–145)

## 2019-05-24 LAB — CBC
HCT: 27.6 % — ABNORMAL LOW (ref 39.0–52.0)
Hemoglobin: 9.5 g/dL — ABNORMAL LOW (ref 13.0–17.0)
MCH: 30.2 pg (ref 26.0–34.0)
MCHC: 34.4 g/dL (ref 30.0–36.0)
MCV: 87.6 fL (ref 80.0–100.0)
Platelets: 173 10*3/uL (ref 150–400)
RBC: 3.15 MIL/uL — ABNORMAL LOW (ref 4.22–5.81)
RDW: 13.7 % (ref 11.5–15.5)
WBC: 6.6 10*3/uL (ref 4.0–10.5)
nRBC: 0 % (ref 0.0–0.2)

## 2019-05-24 LAB — MAGNESIUM: Magnesium: 1.7 mg/dL (ref 1.7–2.4)

## 2019-05-24 MED ORDER — MAGNESIUM SULFATE 2 GM/50ML IV SOLN
2.0000 g | Freq: Once | INTRAVENOUS | Status: AC
  Administered 2019-05-24: 11:00:00 2 g via INTRAVENOUS
  Filled 2019-05-24: qty 50

## 2019-05-24 NOTE — Progress Notes (Signed)
A male from a [place] or [place] came today to interview patient to see if he would be a good fit for there place. He was not in the patients room too long and came out. The patient acted as if he could not walk, talk or feed self. Once the man left the patient tried to get out of bed. He got up and walked to the [facility] across the room with hardly any assistance. Patient can also feed himself. The patient stated that he did not want anyone to know that he was smart as he was he wanted them to think he was stupid and could not do anything because he did not want to leave. PT also came that afternoon and walked patient around the nurses station.

## 2019-05-24 NOTE — Progress Notes (Signed)
   Subjective Patient was originally seen by Dr. Jeffie Pollock on 05/14/2019 for 20 French coud catheter placement.  He has had a long and complex hospitalization.  He remains on ampicillin.  Urology was contacted regarding a possible voiding trial.  I recommended removing the Foley at midnight tonight with monitoring urine output and post void residuals for the next 24 hours.  Indications for Foley replacement would be lower abdominal discomfort with elevated bladder scan over 500 mL, or persistently elevated bladder scans over 200 mL.  If Foley has to be replaced, would recommend 20 [place] coud catheter.  Please call if questions.  If unable to void spontaneously, would recommend chronic Foley, with follow-up in urology clinic in 1 month for Foley exchange.   Billey Co, MD

## 2019-05-24 NOTE — Progress Notes (Signed)
Today the patient did feed himself but would not talk very much to me. He would not take his medications whole like I was told in report, I crushed them and that was the only way he would take them. The patient would not get up today and acted lethargic and slept a lot. This is nothing out the normal for him.

## 2019-05-24 NOTE — Progress Notes (Signed)
[name] attempted visit with Pt. [name] introduced self. Pt was not communicative at this time. Pt was drowsy and spilling food he had eaten.

## 2019-05-24 NOTE — Progress Notes (Signed)
PROGRESS NOTE    Peter Parsons  [id number] DOB: 06/23/1960 DOA: 02/11/2019 PCP: System, Provider Not In   Brief Narrative:  59 year old male with chronic schizophrenia with frequent suicidal ideations, bipolar disorder with depression, type 2 diabetes mellitus (controlled), CKD stage II, peripheral neuropathy, BPH, hypertension presented with altered mental status.  Prolonged hospital stay awaiting placement in a memory care unit.  Does not have capacity.  Hospital course/significant events 3/31: Prolonged hospital stay of almost 6 weeks awaiting for placement.  Foley discontinued after prolonged use in the hospital (suspected was placed due to bladder outlet obstruction).  Intermittent bladder scan showing urinary retention. 4/1: Foley placed in due to urinary retention noted on repeat bladder scans.  Renal ultrasound done showed distended bladder with Foley not in the bladder. 4/2: Patient found to have gross hematuria with clots and Foley removed.  Urology consulted and a 86 French coud catheter placed.  Patient became septic with hypotension, fever and tachycardia.  Received 5 L normal saline bolus with minimal improvement and transferred to stepdown unit.  Started on Neo-Synephrine through peripheral IV and given 1 unit PRBC for acute blood loss anemia. 4/3: Stable off pressors and transferred back to medical floor.  Subjective: Patient has no new complaints today.  Resting comfortably.  Assessment & Plan:   Principal Problem:   Enterococcal bacteremia Active Problems:   Paranoid schizophrenia ([place])   Altered mental status   Dementia ([place])   Weakness   Acute metabolic encephalopathy   Protein-calorie malnutrition, severe   Pressure injury of skin   Hypertension   Type 2 diabetes mellitus (HCC)   Dyslipidemia   BPH (benign prostatic hyperplasia)   Normocytic anemia   Thrombocytopenia (HCC)   Acute urinary retention   Urethral trauma   UTI (urinary tract  infection)  Severe sepsis with septic shock.  Resolved. Secondary to catheter associated UTI. Patient had multiple straight caths done past few days and Foley placed in on 4/1. Transferred to stepdown unit and multiple IV normal saline bolus received.  Required Neo-Synephrine overnight and now stable off pressors.  Sepsis resolved. Blood and urine culture growing Enterococcus faecalis. Antibiotic narrowed to ampicillin according to sensitivity results.  Constipation. -Regular bowel regimen.  Fungal rash on his face after shaving of beard.  -Try keeping that area dry. -Nystatin cream  Hypokalemia/hypomagnesemia.  Resolved -Replete electrolytes as needed and monitor.  Gross hematuria Secondary to traumatic catheterization.  Foley removed and urology placed in a 20 [place] coud Foley without resistance.  Recommend intermittent irrigation, keep Foley upon discharge and follow-up with urology in [place]. Urine now clear.  Received 1 unit PRBC for drop in H&H.  BPH with urinary retention Continue Foley-Foley was placed by urology after having a traumatic urethral injury and they advised to keep the Foley in until he will follow-up with them as an outpatient in 2 weeks. -Urology was consulted again as he remains in the hospital-to keep the catheter till Friday and then they will see him for a voiding trial.  Did not see any note from urology yet. -Contacted Dr. Doristine Counter from urology today, advising remove Foley at night and they will see him tomorrow if he is unable to void. -We will remove Foley at night and give him a voiding trial.  Chronic schizophrenia Continue paliperidone, Klonopin, mirtazapine and risperidone.  History of frequent suicidal ideation.  Does not have capacity.  Outpatient psychiatry follow-up.  Depression with bipolar disorder Continue Lexapro and trazodone.  Acute on chronic kidney disease  stage II Likely ATN with sepsis + bladder outlet obstruction.   Monitor with IV fluids.  Type 2 diabetes mellitus, controlled A1c of 4.5.  CBG stable.  Thrombocytopenia Unclear etiology.  Platelet stable.  Discontinued ibuprofen.  Peripheral neuropathy Continue gabapentin  Hypertension/ hyperlipidemia. BP within goal. Off all blood pressure meds. Continue statin.  Dysphagia/dysarthria and left facial droop MRI negative for CVA.  Continue dysphagia diet  Severe protein calorie malnutrition Continue nutrition supplement and Remeron  Failure to thrive PT recommends home health with 24-hour supervision.  Awaiting placement.  Objective: Vitals:   05/23/19 0733 05/23/19 1647 05/24/19 0047 05/24/19 0750  BP: 104/64 129/71 122/90 119/63  Pulse: (!) 58 81 95 65  Resp: 19 18 18 18   Temp: 97.8 F (36.6 C) 98.3 F (36.8 C) 98.2 F (36.8 C) 98.3 F (36.8 C)  TempSrc:      SpO2: 96% 97% 96% 96%  Weight:      Height:        Intake/Output Summary (Last 24 hours) at 05/24/2019 1514 Last data filed at 05/24/2019 [id number] Gross per 24 hour  Intake --  Output 2750 ml  Net -2750 ml   Filed Weights   05/16/19 0548 05/17/19 0400 05/18/19 0532  Weight: 85.2 kg 84.4 kg 81.6 kg    Examination:  General exam: Chronically ill-appearing gentleman, appears calm and comfortable  Respiratory system: Clear to auscultation. Respiratory effort normal. Cardiovascular system: S1 & S2 heard, RRR. No JVD, murmurs, rubs, gallops or clicks. Gastrointestinal system: Soft, nontender, nondistended, bowel sounds positive. Central nervous system: Alert and oriented. No focal neurological deficits.Symmetric 5 x 5 power. Extremities: No edema, no cyanosis, pulses intact and symmetrical. Psychiatry: Judgement and insight appear impaired.   DVT prophylaxis:.  SCDs. Code Status: Full Family Communication: No family at bedside. Disposition Plan: Difficult disposition.  He was evaluated by a group home  and they refused to take him due to complex medical  needs. Social worker continue to look for a place for him.  Consultants:   Psychiatry  Urology  Procedures:  Antimicrobials:  Ampicillin  Data Reviewed: I have personally reviewed following labs and imaging studies  CBC: Recent Labs  Lab 05/19/19 0508 05/24/19 0554  WBC 5.9 6.6  HGB 9.0* 9.5*  HCT 25.9* 27.6*  MCV 86.9 87.6  PLT 121* [id number]   Basic Metabolic Panel: Recent Labs  Lab 05/19/19 0508 05/20/19 0610 05/24/19 0554  NA 143 143 141  K 3.2* 3.6 3.7  CL 105 103 100  CO2 31 33* 34*  GLUCOSE 86 121* 99  BUN 23 23 31*  CREATININE 0.95 0.85 0.94  CALCIUM 8.8* 8.9 8.8*  MG 1.5* 1.7 1.7   GFR: Estimated Creatinine Clearance: 82.5 mL/min (by C-G formula based on SCr of 0.94 mg/dL). Liver Function Tests: No results for input(s): AST, ALT, ALKPHOS, BILITOT, PROT, ALBUMIN in the last 168 hours. No results for input(s): LIPASE, AMYLASE in the last 168 hours. No results for input(s): AMMONIA in the last 168 hours. Coagulation Profile: No results for input(s): INR, PROTIME in the last 168 hours. Cardiac Enzymes: No results for input(s): CKTOTAL, CKMB, CKMBINDEX, TROPONINI in the last 168 hours. BNP (last 3 results) No results for input(s): PROBNP in the last 8760 hours. HbA1C: No results for input(s): HGBA1C in the last 72 hours. CBG: Recent Labs  Lab 05/18/19 1911  GLUCAP 79   Lipid Profile: No results for input(s): CHOL, HDL, LDLCALC, TRIG, CHOLHDL, LDLDIRECT in the last 72 hours. Thyroid Function Tests:  No results for input(s): TSH, T4TOTAL, FREET4, T3FREE, THYROIDAB in the last 72 hours. Anemia Panel: No results for input(s): VITAMINB12, FOLATE, FERRITIN, TIBC, IRON, RETICCTPCT in the last 72 hours. Sepsis Labs: No results for input(s): PROCALCITON, LATICACIDVEN in the last 168 hours.  Recent Results (from the past 240 hour(s))  MRSA PCR Screening     Status: None   Collection Time: 05/14/19  9:14 PM   Specimen: Nasal Mucosa; Nasopharyngeal  Result  Value Ref Range Status   MRSA by PCR NEGATIVE NEGATIVE Final    Comment:        The GeneXpert MRSA Assay (FDA approved for NASAL specimens only), is one component of a comprehensive MRSA colonization surveillance program. It is not intended to diagnose MRSA infection nor to guide or monitor treatment for MRSA infections. Performed at [facility], [address] [place], [place] [address]   Culture, blood (routine x 2)     Status: None   Collection Time: 05/15/19 11:23 AM   Specimen: BLOOD  Result Value Ref Range Status   Specimen Description BLOOD RIGHT [facility]  Final   Special Requests BLOOD Blood Culture adequate volume  Final   Culture   Final    NO GROWTH 5 DAYS Performed at [facility], [name] [place], [place] [address]    Report Status 05/20/2019 FINAL  Final  Culture, blood (routine x 2)     Status: None   Collection Time: 05/15/19 12:47 PM   Specimen: BLOOD  Result Value Ref Range Status   Specimen Description BLOOD BLRA  Final   Special Requests   Final    BOTTLES DRAWN AEROBIC AND ANAEROBIC Blood Culture results may not be optimal due to an excessive volume of blood received in culture bottles   Culture   Final    NO GROWTH 5 DAYS Performed at [facility], [address] [place], [place] [address]    Report Status 05/20/2019 FINAL  Final     Radiology Studies: No results found.  Scheduled Meds: . atorvastatin  10 mg Oral Daily  . chlorhexidine  15 mL Mouth/Throat BID  . Chlorhexidine Gluconate Cloth  6 each Topical Daily  . escitalopram  10 mg Oral Daily  . feeding supplement (NEPRO CARB STEADY)  237 mL Oral TID BM  . feeding supplement (PRO-STAT SUGAR FREE 64)  30 mL Oral BID  . gabapentin  300 mg Oral TID  . mirtazapine  45 mg Oral QHS  . multivitamin with minerals  1 tablet Oral Daily  . nystatin cream   Topical BID  . risperiDONE  3 mg Oral BID  . senna-docusate  2 tablet Oral BID  . sodium chloride flush   10-40 mL Intracatheter Q12H  . tamsulosin  0.4 mg Oral Daily  . traZODone  150 mg Oral QHS   Continuous Infusions: . sodium chloride 250 mL (05/23/19 0309)  . ampicillin (OMNIPEN) IV 2 g (05/24/19 1459)     LOS: 56 days   Time spent: 35 Minutes.  Lorella Nimrod, MD [facility]  If 7PM-7AM, please contact night-coverage [email or web address]  05/24/2019, 3:14 PM   This record has been created using Dragon voice recognition software. Errors have been sought and corrected,but may not always be located. Such creation errors do not reflect on the standard of care.

## 2019-05-25 DIAGNOSIS — R339 Retention of urine, unspecified: Secondary | ICD-10-CM | POA: Diagnosis not present

## 2019-05-25 DIAGNOSIS — B952 Enterococcus as the cause of diseases classified elsewhere: Secondary | ICD-10-CM | POA: Diagnosis not present

## 2019-05-25 DIAGNOSIS — R7881 Bacteremia: Secondary | ICD-10-CM | POA: Diagnosis not present

## 2019-05-25 MED ORDER — LIDOCAINE HCL URETHRAL/MUCOSAL 2 % EX GEL
1.0000 "application " | Freq: Once | CUTANEOUS | Status: AC
  Administered 2019-05-25: 1 via URETHRAL
  Filled 2019-05-25: qty 5

## 2019-05-25 NOTE — Progress Notes (Signed)
PROGRESS NOTE    Peter Parsons  [id number] DOB: [birth year] DOA: 02/11/2019 PCP: System, Provider Not In   Brief Narrative:  59 year old male with chronic schizophrenia with frequent suicidal ideations, bipolar disorder with depression, type 2 diabetes mellitus (controlled), CKD stage II, peripheral neuropathy, BPH, hypertension presented with altered mental status.  Prolonged hospital stay awaiting placement in a memory care unit.  Does not have capacity.  Hospital course/significant events 3/31: Prolonged hospital stay of almost 6 weeks awaiting for placement.  Foley discontinued after prolonged use in the hospital (suspected was placed due to bladder outlet obstruction).  Intermittent bladder scan showing urinary retention. 4/1: Foley placed in due to urinary retention noted on repeat bladder scans.  Renal ultrasound done showed distended bladder with Foley not in the bladder. 4/2: Patient found to have gross hematuria with clots and Foley removed.  Urology consulted and a 70 French coud catheter placed.  Patient became septic with hypotension, fever and tachycardia.  Received 5 L normal saline bolus with minimal improvement and transferred to stepdown unit.  Started on Neo-Synephrine through peripheral IV and given 1 unit PRBC for acute blood loss anemia. 4/3: Stable off pressors and transferred back to medical floor.  Subjective: Patient was very somnolent when seen today.  Difficult to arouse and not participating.  Breakfast was sitting in front of him. Per nursing staff has not voided yet since the removal of Foley around mid night.  Assessment & Plan:   Principal Problem:   Enterococcal bacteremia Active Problems:   Paranoid schizophrenia ([place])   Altered mental status   Dementia ([place])   Weakness   Acute metabolic encephalopathy   Protein-calorie malnutrition, severe   Pressure injury of skin   Hypertension   Type 2 diabetes mellitus (HCC)   Dyslipidemia   BPH (benign  prostatic hyperplasia)   Normocytic anemia   Thrombocytopenia (HCC)   Acute urinary retention   Urethral trauma   UTI (urinary tract infection)  Severe sepsis with septic shock.  Resolved. Secondary to catheter associated UTI. Patient had multiple straight caths done past few days and Foley placed in on 4/1. Transferred to stepdown unit and multiple IV normal saline bolus received.  Required Neo-Synephrine overnight and now stable off pressors.  Sepsis resolved. Blood and urine culture growing Enterococcus faecalis. Antibiotic narrowed to ampicillin -completed course yesterday.  Constipation. -Regular bowel regimen.  Fungal rash on his face after shaving of beard.  -Try keeping that area dry. -Nystatin cream  Hypokalemia/hypomagnesemia.  Resolved -Replete electrolytes as needed and monitor.  Gross hematuria Secondary to traumatic catheterization.  Foley removed and urology placed in a 20 [place] coud Foley without resistance.  Recommend intermittent irrigation, keep Foley upon discharge and follow-up with urology in [place]. Urine now clear.  Received 1 unit PRBC for drop in H&H.  BPH with urinary retention Continue Foley-Foley was placed by urology after having a traumatic urethral injury and they advised to keep the Foley in until he will follow-up with them as an outpatient in 2 weeks. -Contacted Dr. Doristine Counter from urology again, advising remove Foley at night and they will see him tomorrow if he is unable to void.  Foley was removed around 1 AM last night, has not passed any urine yet.  Bladder scan with urine of 480.  Patient was very somnolent when seen today. -We will give him little more voiding trial once more awake and alert.  If unable to pass urine that he will need a long-term Foley by urology.  Chronic schizophrenia Continue paliperidone, Klonopin, mirtazapine and risperidone.  History of frequent suicidal ideation.  Does not have capacity.  Outpatient psychiatry  follow-up.  Depression with bipolar disorder Continue Lexapro and trazodone.  Acute on chronic kidney disease stage II Likely ATN with sepsis + bladder outlet obstruction.  Monitor with IV fluids.  Type 2 diabetes mellitus, controlled A1c of 4.5.  CBG stable.  Thrombocytopenia Unclear etiology.  Platelet stable.  Discontinued ibuprofen.  Peripheral neuropathy Continue gabapentin  Hypertension/ hyperlipidemia. BP within goal. Off all blood pressure meds. Continue statin.  Dysphagia/dysarthria and left facial droop MRI negative for CVA.  Continue dysphagia diet  Severe protein calorie malnutrition Continue nutrition supplement and Remeron  Failure to thrive PT recommends home health with 24-hour supervision.  Awaiting placement.  Objective: Vitals:   05/24/19 1755 05/25/19 0029 05/25/19 0500 05/25/19 0723  BP: (!) 122/59 112/60  103/61  Pulse: 73 73  (!) 57  Resp: 14 15  16   Temp: 98.7 F (37.1 C) 98.2 F (36.8 C)  99 F (37.2 C)  TempSrc:    Axillary  SpO2: 97% 97%  99%  Weight:   83.5 kg   Height:        Intake/Output Summary (Last 24 hours) at 05/25/2019 1122 Last data filed at 05/25/2019 0134 Gross per 24 hour  Intake 1650 ml  Output --  Net 1650 ml   Filed Weights   05/17/19 0400 05/18/19 0532 05/25/19 0500  Weight: 84.4 kg 81.6 kg 83.5 kg    Examination:  General exam: Chronically ill-appearing gentleman, appears calm and comfortable.  Somnolent, little difficult to arouse, just stare for a second and then fell back to sleep. Respiratory system: Clear to auscultation. Respiratory effort normal. Cardiovascular system: S1 & S2 heard, RRR. No JVD, murmurs, rubs, gallops or clicks. Gastrointestinal system: Soft, nontender, nondistended, bowel sounds positive. Central nervous system: Alert and oriented. No focal neurological deficits.Symmetric 5 x 5 power. Extremities: No edema, no cyanosis, pulses intact and symmetrical. Psychiatry: Judgement  and insight appear impaired.   DVT prophylaxis:.  SCDs. Code Status: Full Family Communication: No family at bedside. Disposition Plan: Difficult disposition.  He was evaluated by a group home  and they refused to take him due to complex medical needs. Social worker continue to look for a place for him.  Consultants:   Psychiatry  Urology  Procedures:  Antimicrobials:   Data Reviewed: I have personally reviewed following labs and imaging studies  CBC: Recent Labs  Lab 05/19/19 0508 05/24/19 0554  WBC 5.9 6.6  HGB 9.0* 9.5*  HCT 25.9* 27.6*  MCV 86.9 87.6  PLT 121* [id number]   Basic Metabolic Panel: Recent Labs  Lab 05/19/19 0508 05/20/19 0610 05/24/19 0554  NA 143 143 141  K 3.2* 3.6 3.7  CL 105 103 100  CO2 31 33* 34*  GLUCOSE 86 121* 99  BUN 23 23 31*  CREATININE 0.95 0.85 0.94  CALCIUM 8.8* 8.9 8.8*  MG 1.5* 1.7 1.7   GFR: Estimated Creatinine Clearance: 82.5 mL/min (by C-G formula based on SCr of 0.94 mg/dL). Liver Function Tests: No results for input(s): AST, ALT, ALKPHOS, BILITOT, PROT, ALBUMIN in the last 168 hours. No results for input(s): LIPASE, AMYLASE in the last 168 hours. No results for input(s): AMMONIA in the last 168 hours. Coagulation Profile: No results for input(s): INR, PROTIME in the last 168 hours. Cardiac Enzymes: No results for input(s): CKTOTAL, CKMB, CKMBINDEX, TROPONINI in the last 168 hours. BNP (last 3 results) No  results for input(s): PROBNP in the last 8760 hours. HbA1C: No results for input(s): HGBA1C in the last 72 hours. CBG: Recent Labs  Lab 05/18/19 1911  GLUCAP 79   Lipid Profile: No results for input(s): CHOL, HDL, LDLCALC, TRIG, CHOLHDL, LDLDIRECT in the last 72 hours. Thyroid Function Tests: No results for input(s): TSH, T4TOTAL, FREET4, T3FREE, THYROIDAB in the last 72 hours. Anemia Panel: No results for input(s): VITAMINB12, FOLATE, FERRITIN, TIBC, IRON, RETICCTPCT in the last 72 hours. Sepsis Labs: No  results for input(s): PROCALCITON, LATICACIDVEN in the last 168 hours.  Recent Results (from the past 240 hour(s))  Culture, blood (routine x 2)     Status: None   Collection Time: 05/15/19 11:23 AM   Specimen: BLOOD  Result Value Ref Range Status   Specimen Description BLOOD RIGHT [facility]  Final   Special Requests BLOOD Blood Culture adequate volume  Final   Culture   Final    NO GROWTH 5 DAYS Performed at [facility], [address], [place], [place] [address]    Report Status 05/20/2019 FINAL  Final  Culture, blood (routine x 2)     Status: None   Collection Time: 05/15/19 12:47 PM   Specimen: BLOOD  Result Value Ref Range Status   Specimen Description BLOOD BLRA  Final   Special Requests   Final    BOTTLES DRAWN AEROBIC AND ANAEROBIC Blood Culture results may not be optimal due to an excessive volume of blood received in culture bottles   Culture   Final    NO GROWTH 5 DAYS Performed at [facility], [address] [place], [place] [address]    Report Status 05/20/2019 FINAL  Final     Radiology Studies: No results found.  Scheduled Meds: . atorvastatin  10 mg Oral Daily  . chlorhexidine  15 mL Mouth/Throat BID  . Chlorhexidine Gluconate Cloth  6 each Topical Daily  . escitalopram  10 mg Oral Daily  . feeding supplement (NEPRO CARB STEADY)  237 mL Oral TID BM  . feeding supplement (PRO-STAT SUGAR FREE 64)  30 mL Oral BID  . gabapentin  300 mg Oral TID  . mirtazapine  45 mg Oral QHS  . multivitamin with minerals  1 tablet Oral Daily  . nystatin cream   Topical BID  . risperiDONE  3 mg Oral BID  . senna-docusate  2 tablet Oral BID  . sodium chloride flush  10-40 mL Intracatheter Q12H  . tamsulosin  0.4 mg Oral Daily  . traZODone  150 mg Oral QHS   Continuous Infusions: . sodium chloride 250 mL (05/23/19 0309)     LOS: 57 days   Time spent: 35 Minutes.  Lorella Nimrod, MD [facility]  If 7PM-7AM, please contact  night-coverage [email or web address]  05/25/2019, 11:22 AM   This record has been created using Systems analyst. Errors have been sought and corrected,but may not always be located. Such creation errors do not reflect on the standard of care.

## 2019-05-25 NOTE — Progress Notes (Signed)
Nutrition Follow-up  DOCUMENTATION CODES:   Severe malnutrition in context of social or environmental circumstances  INTERVENTION:  Continue Nepro Shake po TID, each supplement provides 425 kcal and 19 grams protein.  Continue Pro-Stat 30 mL po BID, each supplement provides 100 kcal and 15 grams of protein.  Continue daily MVI.  NUTRITION DIAGNOSIS:   Severe Malnutrition related to social / environmental circumstances(inadequate oral intake) as evidenced by percent weight loss, moderate fat depletion, moderate muscle depletion, severe muscle depletion.  Ongoing.  GOAL:   Patient will meet greater than or equal to 90% of their needs  Progressing.  MONITOR:   PO intake, Supplement acceptance, Labs, Weight trends, I & O's  REASON FOR ASSESSMENT:   Malnutrition Screening Tool    ASSESSMENT:   59 year old male with PMHx of schizophrenia, HTN, DM who has been in [place] for 44 days and is now admitted with generalized weakness, dysphagia, slurred speech, left facial droop to rule out CVA.  According to chart patient has been more lethargic today. Met with patient at bedside this afternoon. He was resting with his eyes closed but woke to name call. He reports he feels tired today. He does not think he ate any breakfast this morning. He reports he was able to eat fairly well at lunch (noted he ate 50% of that meal). He continues to drink his Nepro and Pro-Stat and reports he is tolerating them well. Patient's intake is very variable. Some days he has skipped meals or is only eating 25% of his meal but then he has other days where he eats 75-85% of his meals.  Medications reviewed and include: MVI daily, Remeron 45 mg QHS, senna-docusate 2 tablets BID, Flomax.  Labs reviewed.  Weight trend: 83.5 kg on 4/13; +12.7 kg from 2/14  Discussed with RN.  Diet Order:   Diet Order            DIET - DYS 1 Room service appropriate? Yes with Assist; Fluid consistency: Thin  Diet effective  now             EDUCATION NEEDS:   No education needs have been identified at this time  Skin:  Skin Assessment: Skin Integrity Issues:(unstageable to coccyx; DTIs to bilateral heels and right ankle)  Last BM:  05/23/2019 large type 1  Height:   Ht Readings from Last 1 Encounters:  03/28/19 _0  (1.753 m)   Weight:   Wt Readings from Last 1 Encounters:  05/25/19 83.5 kg   Ideal Body Weight:  72.7 kg  BMI:  Body mass index is 27.18 kg/m.  Estimated Nutritional Needs:   Kcal:  2000-2200  Protein:  100-110 grams  Fluid:  >/= 2 L/day  Jacklynn Barnacle, MS, RD, LDN Pager number available on Amion

## 2019-05-25 NOTE — Progress Notes (Signed)
OT Cancellation Note  Patient Details Name: Peter Parsons MRN: [id number] DOB: 04/09/1960   Cancelled Treatment:    Reason Eval/Treat Not Completed: Medical issues which prohibited therapy. OT attempted to see pt for re-evaluation/treatment session this date. Per discussion with pt primary RN, pt has not urinated this date, possible plans for catheterization later this PM. RN requests therapist re-attempt next date. Will hold OT tx and re-attempt as available/pt medically appropriate for OT session.   Shara Blazing, M.S., OTR/L Ascom: [phone] 05/25/19, 2:29 PM

## 2019-05-25 NOTE — Progress Notes (Signed)
Urology Inpatient Progress Note  Subjective: Peter Parsons is a 59 y.o. comorbid male with prolonged hospitalization awaiting memory care unit placement seen by Dr. Jeffie Pollock on 05/14/2019 for urinary retention with Foley trauma with subsequent placement of a 20 French coud Foley catheter.  Voiding trial initiated overnight with Foley discontinued around 0100.  No urinary output documented in his chart since.   Patient is somnolent today and minimally arousable.  He is not able to contribute to HPI.  Abdomen soft and nondistended, no palpable bladder this AM.  Anti-infectives: Anti-infectives (From admission, onward)   Start     Dose/Rate Route Frequency Ordered Stop   05/16/19 0800  ampicillin (OMNIPEN) 2 g in sodium chloride 0.9 % 100 mL IVPB     2 g 300 mL/hr over 20 Minutes Intravenous Every 4 hours 05/15/19 1244 05/24/19 2059   05/15/19 2200  ceFEPIme (MAXIPIME) 2 g in sodium chloride 0.9 % 100 mL IVPB  Status:  Discontinued     2 g 200 mL/hr over 30 Minutes Intravenous Every 12 hours 05/15/19 0724 05/15/19 1115   05/15/19 2200  ampicillin (OMNIPEN) 2 g in sodium chloride 0.9 % 100 mL IVPB  Status:  Discontinued     2 g 300 mL/hr over 20 Minutes Intravenous Every 8 hours 05/15/19 1115 05/15/19 1244   05/15/19 1500  vancomycin (VANCOREADY) IVPB 1750 mg/350 mL  Status:  Discontinued     1,750 mg 175 mL/hr over 120 Minutes Intravenous Every 24 hours 05/14/19 1340 05/15/19 0737   05/15/19 1000  vancomycin (VANCOREADY) IVPB 1250 mg/250 mL  Status:  Discontinued     1,250 mg 166.7 mL/hr over 90 Minutes Intravenous Every 24 hours 05/15/19 0737 05/15/19 1115   05/14/19 1400  ceFEPIme (MAXIPIME) 2 g in sodium chloride 0.9 % 100 mL IVPB  Status:  Discontinued     2 g 200 mL/hr over 30 Minutes Intravenous Every 8 hours 05/14/19 1339 05/15/19 0724   05/14/19 1400  vancomycin (VANCOCIN) 2,000 mg in sodium chloride 0.9 % 500 mL IVPB     2,000 mg 250 mL/hr over 120 Minutes Intravenous  Once 05/14/19  1339 05/14/19 1709      Current Facility-Administered Medications  Medication Dose Route Frequency Provider Last Rate Last Admin  . 0.9 %  sodium chloride infusion   Intravenous PRN Lorella Nimrod, MD 10 mL/hr at 05/23/19 0309 250 mL at 05/23/19 0309  . atorvastatin (LIPITOR) tablet 10 mg  10 mg Oral Daily Carrie Mew, MD   10 mg at 05/24/19 1057  . chlorhexidine (PERIDEX) 0.12 % solution 15 mL  15 mL Mouth/Throat BID Lavina Hamman, MD   15 mL at 05/24/19 2046  . Chlorhexidine Gluconate Cloth 2 % PADS 6 each  6 each Topical Daily Dhungel, Nishant, MD   6 each at 05/24/19 1059  . clonazePAM (KLONOPIN) tablet 0.25 mg  0.25 mg Oral TID PRN Dixie Dials, MD   0.25 mg at 05/01/19 1603  . dextrose 50 % solution 50 mL  1 ampule Intravenous PRN Lavina Hamman, MD      . escitalopram (LEXAPRO) tablet 10 mg  10 mg Oral Daily Cristofano, Dorene Ar, MD   10 mg at 05/24/19 1058  . feeding supplement (NEPRO CARB STEADY) liquid 237 mL  237 mL Oral TID BM Enzo Bi, MD 0 mL/hr at 05/19/19 0434 237 mL at 05/24/19 2044  . feeding supplement (PRO-STAT SUGAR FREE 64) liquid 30 mL  30 mL Oral BID Dhungel, Nishant, MD  30 mL at 05/24/19 2044  . gabapentin (NEURONTIN) capsule 300 mg  300 mg Oral TID Carrie Mew, MD   300 mg at 05/24/19 2046  . metoprolol tartrate (LOPRESSOR) injection 5 mg  5 mg Intravenous Q12H PRN Wyvonnia Dusky, MD   5 mg at 05/14/19 1011  . mirtazapine (REMERON) tablet 45 mg  45 mg Oral QHS Clapacs, Madie Reno, MD   45 mg at 05/24/19 2045  . multivitamin with minerals tablet 1 tablet  1 tablet Oral Daily Cristofano, Dorene Ar, MD   1 tablet at 05/24/19 1058  . nystatin cream (MYCOSTATIN)   Topical BID Lorella Nimrod, MD   Given at 05/24/19 2046  . polyethylene glycol (MIRALAX / GLYCOLAX) packet 17 g  17 g Oral QHS PRN Lorella Nimrod, MD   17 g at 05/20/19 2054  . risperiDONE (RISPERDAL) tablet 3 mg  3 mg Oral BID Carrie Mew, MD   3 mg at 05/24/19 2045  . senna-docusate  (Senokot-S) tablet 2 tablet  2 tablet Oral BID Lorella Nimrod, MD   2 tablet at 05/24/19 2045  . sodium chloride flush (NS) 0.9 % injection 10-40 mL  10-40 mL Intracatheter Q12H Dhungel, Nishant, MD   10 mL at 05/24/19 1059  . sodium chloride flush (NS) 0.9 % injection 10-40 mL  10-40 mL Intracatheter PRN Dhungel, Nishant, MD      . tamsulosin (FLOMAX) capsule 0.4 mg  0.4 mg Oral Daily Fritzi Mandes, MD   0.4 mg at 05/24/19 1058  . traZODone (DESYREL) tablet 150 mg  150 mg Oral QHS Carrie Mew, MD   150 mg at 05/24/19 2045   Objective: Vital signs in last 24 hours: Temp:  [98.2 F (36.8 C)-99 F (37.2 C)] 99 F (37.2 C) (04/13 0723) Pulse Rate:  [57-73] 57 (04/13 0723) Resp:  [14-16] 16 (04/13 0723) BP: (103-122)/(59-61) 103/61 (04/13 0723) SpO2:  [97 %-99 %] 99 % (04/13 0723) Weight:  [83.5 kg] 83.5 kg (04/13 0500)  Intake/Output from previous day: 04/12 0701 - 04/13 0700 In: 1650  Out: -  Intake/Output this shift: No intake/output data recorded.  Physical Exam Vitals and nursing note reviewed.  Constitutional:      General: He is sleeping.  Pulmonary:     Effort: Pulmonary effort is normal. No respiratory distress.  Abdominal:     General: There is no distension.     Palpations: Abdomen is soft.     Tenderness: There is no abdominal tenderness. There is no guarding or rebound.  Skin:    General: Skin is warm and dry.    Lab Results:  Recent Labs    05/24/19 0554  WBC 6.6  HGB 9.5*  HCT 27.6*  PLT 173   BMET Recent Labs    05/24/19 0554  NA 141  K 3.7  CL 100  CO2 34*  GLUCOSE 99  BUN 31*  CREATININE 0.94  CALCIUM 8.8*   Assessment & Plan: 59 year old comorbid male with prolonged hospitalization featuring urinary retention with Foley trauma requiring placement of a 20 French coud Foley catheter, today with voiding trial.  It does not appear that he has urinated following Foley removal 8 hours ago, unclear if this is secondary to somnolence or  recurrent retention.  Patient unable to contribute to HPI, however abdominal exam is without palpable bladder.  Recommend prompting patient to void upon awakening and serial bladder scans throughout the day.    As previously noted, indications for Foley replacement would be lower abdominal  discomfort with elevated bladder scan over 500 mL or persistently elevated bladder scans over 200 mL.  If Foley has to be replaced, recommend 20 French coud Foley catheter.  In the setting of recurrent urinary retention, recommend plans for chronic Foley catheterization with follow-up in urology clinic in 1 month for Foley exchange.  Please contact urology with any additional questions.  Debroah Loop, PA-C 05/25/2019

## 2019-05-25 NOTE — Progress Notes (Signed)
Catheter removed approx 0100 (see LDA"s). Pt remains to void post removal. Will inform oncoming nurse, also asked that assigned NT pass off information with NT report. NT indicated she would.

## 2019-05-26 DIAGNOSIS — R7881 Bacteremia: Secondary | ICD-10-CM | POA: Diagnosis not present

## 2019-05-26 DIAGNOSIS — B952 Enterococcus as the cause of diseases classified elsewhere: Secondary | ICD-10-CM | POA: Diagnosis not present

## 2019-05-26 LAB — CBC
HCT: 28.9 % — ABNORMAL LOW (ref 39.0–52.0)
Hemoglobin: 9.9 g/dL — ABNORMAL LOW (ref 13.0–17.0)
MCH: 30.2 pg (ref 26.0–34.0)
MCHC: 34.3 g/dL (ref 30.0–36.0)
MCV: 88.1 fL (ref 80.0–100.0)
Platelets: 160 10*3/uL (ref 150–400)
RBC: 3.28 MIL/uL — ABNORMAL LOW (ref 4.22–5.81)
RDW: 13.8 % (ref 11.5–15.5)
WBC: 6.3 10*3/uL (ref 4.0–10.5)
nRBC: 0 % (ref 0.0–0.2)

## 2019-05-26 LAB — BASIC METABOLIC PANEL
Anion gap: 8 (ref 5–15)
BUN: 38 mg/dL — ABNORMAL HIGH (ref 8–23)
CO2: 32 mmol/L (ref 22–32)
Calcium: 8.8 mg/dL — ABNORMAL LOW (ref 8.9–10.3)
Chloride: 100 mmol/L (ref 98–111)
Creatinine, Ser: 0.93 mg/dL (ref 0.61–1.24)
GFR calc Af Amer: >60 mL/min (ref >60)
GFR calc non Af Amer: >60 mL/min (ref >60)
Glucose, Bld: 94 mg/dL (ref 70–99)
Potassium: 4 mmol/L (ref 3.5–5.1)
Sodium: 140 mmol/L (ref 135–145)

## 2019-05-26 LAB — MAGNESIUM: Magnesium: 1.9 mg/dL (ref 1.7–2.4)

## 2019-05-26 MED ORDER — POLYETHYLENE GLYCOL 3350 17 G PO PACK
17.0000 g | PACK | Freq: Two times a day (BID) | ORAL | Status: DC
  Administered 2019-05-26 – 2019-07-11 (×62): 17 g via ORAL
  Filled 2019-05-26 (×71): qty 1

## 2019-05-26 NOTE — Progress Notes (Signed)
PROGRESS NOTE    Peter Parsons  [id number] DOB: 04/12/1960 DOA: 02/11/2019 PCP: System, Provider Not In   Brief Narrative:  59 year old male with chronic schizophrenia with frequent suicidal ideations, bipolar disorder with depression, type 2 diabetes mellitus (controlled), CKD stage II, peripheral neuropathy, BPH, hypertension presented with altered mental status.  Prolonged hospital stay awaiting placement in a memory care unit.  Does not have capacity.  Hospital course/significant events 3/31: Prolonged hospital stay of almost 6 weeks awaiting for placement.  Foley discontinued after prolonged use in the hospital (suspected was placed due to bladder outlet obstruction).  Intermittent bladder scan showing urinary retention. 4/1: Foley placed in due to urinary retention noted on repeat bladder scans.  Renal ultrasound done showed distended bladder with Foley not in the bladder. 4/2: Patient found to have gross hematuria with clots and Foley removed.  Urology consulted and a 65 French coud catheter placed.  Patient became septic with hypotension, fever and tachycardia.  Received 5 L normal saline bolus with minimal improvement and transferred to stepdown unit.  Started on Neo-Synephrine through peripheral IV and given 1 unit PRBC for acute blood loss anemia. 4/3: Stable off pressors and transferred back to medical floor.  Subjective: Pt was awake, ate all of his meal.  Denied pain, but confirmed difficulty urinating on his own.  Not very engaged in answering questions, but when asked what he would like to eat/drink, pt requested Nepro butter nut flavor.  No noted fever, N/V/D.   Assessment & Plan:   Principal Problem:   Enterococcal bacteremia Active Problems:   Paranoid schizophrenia ([place])   Altered mental status   Dementia ([place])   Weakness   Acute metabolic encephalopathy   Protein-calorie malnutrition, severe   Pressure injury of skin   Hypertension   Type 2 diabetes  mellitus ([facility])   Dyslipidemia   BPH (benign prostatic hyperplasia)   Normocytic anemia   Thrombocytopenia ([facility])   Acute urinary retention   Urethral trauma   UTI (urinary tract infection)  Severe sepsis with septic shock.  Resolved. Secondary to catheter associated UTI. Patient had multiple straight caths done past few days and Foley placed in on 4/1. Transferred to stepdown unit and multiple IV normal saline bolus received.  Required Neo-Synephrine overnight and now stable off pressors.  Sepsis resolved. Blood and urine culture growing Enterococcus faecalis. Antibiotic narrowed to ampicillin -completed course.  Constipation. -Miralax BID  Fungal rash on his face after shaving of beard.  -Try keeping that area dry. -Nystatin cream  Hypokalemia/hypomagnesemia.  Resolved -Replete electrolytes as needed and monitor.  Gross hematuria, resolved Secondary to traumatic catheterization.  Foley removed and urology placed in a 20 [place] coud Foley without resistance.  Recommend intermittent irrigation, keep Foley upon discharge and follow-up with urology in [place]. Urine now clear.  Received 1 unit PRBC for drop in H&H.  BPH with urinary retention Continue Foley-Foley was placed by urology after having a traumatic urethral injury and they advised to keep the Foley in until he will follow-up with them as an outpatient in 2 weeks. -Failed voiding trail, Coude Foley re-inserted on 4/13.  Chronic schizophrenia Continue paliperidone, Klonopin, mirtazapine and risperidone.  History of frequent suicidal ideation.  Does not have capacity.  Outpatient psychiatry follow-up.  Depression with bipolar disorder Continue Lexapro and trazodone.  Acute on chronic kidney disease stage II Likely ATN with sepsis + bladder outlet obstruction.    Type 2 diabetes mellitus, controlled A1c of 4.5.  CBG stable.  Thrombocytopenia Unclear  etiology.  Platelet stable.  Discontinued  ibuprofen.  Peripheral neuropathy Continue gabapentin  Hypertension/ hyperlipidemia. BP within goal. Off all blood pressure meds. Continue statin.  Dysphagia/dysarthria and left facial droop MRI negative for CVA.  Continue dysphagia diet  Severe protein calorie malnutrition Continue nutrition supplement and Remeron  Failure to thrive PT recommends home health with 24-hour supervision.  Awaiting placement.   Objective: Vitals:   05/25/19 0500 05/25/19 0723 05/25/19 2358 05/26/19 1524  BP:  103/61 (!) 146/85 128/75  Pulse:  (!) 57 90 94  Resp:  16 20 16   Temp:  99 F (37.2 C) 98.3 F (36.8 C) 98.1 F (36.7 C)  TempSrc:  Axillary Oral Axillary  SpO2:  99% 98% 97%  Weight: 83.5 kg     Height:        Intake/Output Summary (Last 24 hours) at 05/26/2019 1943 Last data filed at 05/26/2019 1845 Gross per 24 hour  Intake 1197 ml  Output 3200 ml  Net -2003 ml   Filed Weights   05/17/19 0400 05/18/19 0532 05/25/19 0500  Weight: 84.4 kg 81.6 kg 83.5 kg    Examination:  Constitutional: NAD, alert, not engaged, but can answer question if asked repeatedly HEENT: conjunctivae and lids normal, EOMI CV: RRR no M,R,G. Distal pulses +2.  No cyanosis.   RESP: CTA B/L, normal respiratory effort  GI: +BS, NTND Extremities: No effusions, edema, or tenderness in BLE SKIN: warm, dry and intact Neuro: II - XII grossly intact.  Sensation intact Psych: Flat mood and affect.   Foley in.   DVT prophylaxis:.  SCDs. Code Status: Full Family Communication:  Disposition Plan: Difficult disposition.  He was evaluated by a group home and they refused to take him due to complex medical needs. Social worker continues to look for a place for him.  Consultants:   Psychiatry  Urology  Procedures:  Antimicrobials:   Data Reviewed: I have personally reviewed following labs and imaging studies  CBC: Recent Labs  Lab 05/24/19 0554 05/26/19 0817  WBC 6.6 6.3  HGB 9.5* 9.9*   HCT 27.6* 28.9*  MCV 87.6 88.1  PLT 173 0000000   Basic Metabolic Panel: Recent Labs  Lab 05/20/19 0610 05/24/19 0554 05/26/19 0817  NA 143 141 140  K 3.6 3.7 4.0  CL 103 100 100  CO2 33* 34* 32  GLUCOSE 121* 99 94  BUN 23 31* 38*  CREATININE 0.85 0.94 0.93  CALCIUM 8.9 8.8* 8.8*  MG 1.7 1.7 1.9   GFR: Estimated Creatinine Clearance: 83.4 mL/min (by C-G formula based on SCr of 0.93 mg/dL). Liver Function Tests: No results for input(s): AST, ALT, ALKPHOS, BILITOT, PROT, ALBUMIN in the last 168 hours. No results for input(s): LIPASE, AMYLASE in the last 168 hours. No results for input(s): AMMONIA in the last 168 hours. Coagulation Profile: No results for input(s): INR, PROTIME in the last 168 hours. Cardiac Enzymes: No results for input(s): CKTOTAL, CKMB, CKMBINDEX, TROPONINI in the last 168 hours. BNP (last 3 results) No results for input(s): PROBNP in the last 8760 hours. HbA1C: No results for input(s): HGBA1C in the last 72 hours. CBG: No results for input(s): GLUCAP in the last 168 hours. Lipid Profile: No results for input(s): CHOL, HDL, LDLCALC, TRIG, CHOLHDL, LDLDIRECT in the last 72 hours. Thyroid Function Tests: No results for input(s): TSH, T4TOTAL, FREET4, T3FREE, THYROIDAB in the last 72 hours. Anemia Panel: No results for input(s): VITAMINB12, FOLATE, FERRITIN, TIBC, IRON, RETICCTPCT in the last 72 hours. Sepsis Labs:  No results for input(s): PROCALCITON, LATICACIDVEN in the last 168 hours.  No results found for this or any previous visit (from the past 240 hour(s)).   Radiology Studies: No results found.  Scheduled Meds: . atorvastatin  10 mg Oral Daily  . chlorhexidine  15 mL Mouth/Throat BID  . Chlorhexidine Gluconate Cloth  6 each Topical Daily  . escitalopram  10 mg Oral Daily  . feeding supplement (NEPRO CARB STEADY)  237 mL Oral TID BM  . feeding supplement (PRO-STAT SUGAR FREE 64)  30 mL Oral BID  . gabapentin  300 mg Oral TID  . mirtazapine   45 mg Oral QHS  . multivitamin with minerals  1 tablet Oral Daily  . nystatin cream   Topical BID  . risperiDONE  3 mg Oral BID  . senna-docusate  2 tablet Oral BID  . sodium chloride flush  10-40 mL Intracatheter Q12H  . tamsulosin  0.4 mg Oral Daily  . traZODone  150 mg Oral QHS   Continuous Infusions: . sodium chloride 250 mL (05/23/19 0309)     LOS: 81 days    Enzo Bi, MD [facility]  If 7PM-7AM, please contact night-coverage [email or web address]  05/26/2019, 7:43 PM

## 2019-05-26 NOTE — Consult Note (Signed)
[place] Nurse Consult Note: Reason for Consult: pressure injuries Seen by [facility] nurse team 04/16/19; see consult note. Discussed with bedside nurse for acute changes and if that was the rationale for new consult. Indicated maybe that Staging was inappropriate on the sacral wound. Wound type: Stage 3 Pressure injury: sacrum Pressure Injury POA: No Measurement: 2.5cm x 2.8cm x 0.1cm  Wound bed:100% pink; clean, moist Drainage (amount, consistency, odor) minimal, no odor, no s/s of infection Periwound: intact  Dressing procedure/placement/frequency: Continue silicone foam dressing; lift for inspection of wound each shift. Report acute changes to the [facility] nurse team Added chair pressure redistribution pad for when up in the chair.   OT in the room to stand patient and moving to the chair.   Discussed POC with patient and bedside nurse.  Re consult if needed, will not follow at this time. Thanks  Whittaker Lenis [employer], RN,CWOCN, CNS, [place] [phone])
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
## 2019-05-27 DIAGNOSIS — B952 Enterococcus as the cause of diseases classified elsewhere: Secondary | ICD-10-CM | POA: Diagnosis not present

## 2019-05-27 DIAGNOSIS — R7881 Bacteremia: Secondary | ICD-10-CM | POA: Diagnosis not present

## 2019-05-27 LAB — BASIC METABOLIC PANEL
Anion gap: 9 (ref 5–15)
BUN: 40 mg/dL — ABNORMAL HIGH (ref 8–23)
CO2: 31 mmol/L (ref 22–32)
Calcium: 9.3 mg/dL (ref 8.9–10.3)
Chloride: 102 mmol/L (ref 98–111)
Creatinine, Ser: 0.94 mg/dL (ref 0.61–1.24)
GFR calc Af Amer: >60 mL/min (ref >60)
GFR calc non Af Amer: >60 mL/min (ref >60)
Glucose, Bld: 96 mg/dL (ref 70–99)
Potassium: 3.7 mmol/L (ref 3.5–5.1)
Sodium: 142 mmol/L (ref 135–145)

## 2019-05-27 LAB — CBC
HCT: 29.5 % — ABNORMAL LOW (ref 39.0–52.0)
Hemoglobin: 10.2 g/dL — ABNORMAL LOW (ref 13.0–17.0)
MCH: 30.1 pg (ref 26.0–34.0)
MCHC: 34.6 g/dL (ref 30.0–36.0)
MCV: 87 fL (ref 80.0–100.0)
Platelets: 153 10*3/uL (ref 150–400)
RBC: 3.39 MIL/uL — ABNORMAL LOW (ref 4.22–5.81)
RDW: 13.8 % (ref 11.5–15.5)
WBC: 5.5 10*3/uL (ref 4.0–10.5)
nRBC: 0 % (ref 0.0–0.2)

## 2019-05-27 LAB — MAGNESIUM: Magnesium: 1.7 mg/dL (ref 1.7–2.4)

## 2019-05-27 NOTE — Progress Notes (Signed)
PROGRESS NOTE    Peter Parsons  [id number] DOB: Apr 30, 1960 DOA: 02/11/2019 PCP: System, Provider Not In   Brief Narrative:  59 year old male with chronic schizophrenia with frequent suicidal ideations, bipolar disorder with depression, type 2 diabetes mellitus (controlled), CKD stage II, peripheral neuropathy, BPH, hypertension presented with altered mental status.  Prolonged hospital stay awaiting placement in a memory care unit.  Does not have capacity.  Hospital course/significant events 3/31: Prolonged hospital stay of almost 6 weeks awaiting for placement.  Foley discontinued after prolonged use in the hospital (suspected was placed due to bladder outlet obstruction).  Intermittent bladder scan showing urinary retention. 4/1: Foley placed in due to urinary retention noted on repeat bladder scans.  Renal ultrasound done showed distended bladder with Foley not in the bladder. 4/2: Patient found to have gross hematuria with clots and Foley removed.  Urology consulted and a 38 French coud catheter placed.  Patient became septic with hypotension, fever and tachycardia.  Received 5 L normal saline bolus with minimal improvement and transferred to stepdown unit.  Started on Neo-Synephrine through peripheral IV and given 1 unit PRBC for acute blood loss anemia. 4/3: Stable off pressors and transferred back to medical floor.  Subjective: Pt sleeps all the time, but wakes up easily.  Ate all his meals and drank his Nephro.  Pt was aware that his bedsore was painful.  Complained of bad dreams.  No fever, N/V/D.   Assessment & Plan:   Principal Problem:   Enterococcal bacteremia Active Problems:   Paranoid schizophrenia ([place])   Altered mental status   Dementia ([place])   Weakness   Acute metabolic encephalopathy   Protein-calorie malnutrition, severe   Pressure injury of skin   Hypertension   Type 2 diabetes mellitus (HCC)   Dyslipidemia   BPH (benign prostatic hyperplasia)  Normocytic anemia   Thrombocytopenia (HCC)   Acute urinary retention   Urethral trauma   UTI (urinary tract infection)  Severe sepsis with septic shock.  Resolved. Secondary to catheter associated UTI. Patient had multiple straight caths done past few days and Foley placed in on 4/1. Transferred to stepdown unit and multiple IV normal saline bolus received.  Required Neo-Synephrine overnight and now stable off pressors.  Sepsis resolved. Blood and urine culture growing Enterococcus faecalis. Antibiotic narrowed to ampicillin -completed course.  Constipation. -Miralax BID  Fungal rash on his face after shaving of beard.  -Try keeping that area dry. -Nystatin cream  Hypokalemia/hypomagnesemia.  Resolved -Replete electrolytes as needed and monitor.  Gross hematuria, resolved Secondary to traumatic catheterization.  Foley removed and urology placed in a 20 [place] coud Foley without resistance.  Recommend intermittent irrigation, keep Foley upon discharge and follow-up with urology in [place]. Urine now clear.  Received 1 unit PRBC for drop in H&H.  BPH with urinary retention, now with chronic Foley Continue Foley-Foley was placed by urology after having a traumatic urethral injury and they advised to keep the Foley in until he will follow-up with them as an outpatient in 2 weeks. -Failed voiding trail, Coude Foley re-inserted on 4/13.  Chronic schizophrenia Continue paliperidone, Klonopin, mirtazapine and risperidone.  History of frequent suicidal ideation.  Does not have capacity.  Outpatient psychiatry follow-up.  Depression with bipolar disorder Continue Lexapro and trazodone.  Acute on chronic kidney disease stage II Likely ATN with sepsis + bladder outlet obstruction.    Type 2 diabetes mellitus, controlled A1c of 4.5.  CBG stable.  Thrombocytopenia Unclear etiology.  Platelet stable.  Discontinued ibuprofen.  Peripheral neuropathy Continue  gabapentin  Hypertension/ hyperlipidemia. BP within goal. Off all blood pressure meds. Continue statin.  Dysphagia/dysarthria and left facial droop MRI negative for CVA.  Continue dysphagia diet  Severe protein calorie malnutrition Continue nutrition supplement and Remeron  Failure to thrive PT recommends home health with 24-hour supervision.  Awaiting placement.   Objective: Vitals:   05/26/19 1524 05/26/19 2355 05/27/19 0757 05/27/19 1556  BP: 128/75 116/73 116/69 114/71  Pulse: 94 60 61 78  Resp: 16 14 16 16   Temp: 98.1 F (36.7 C) 99.2 F (37.3 C) 98.5 F (36.9 C) 98.2 F (36.8 C)  TempSrc: Axillary Oral Oral Oral  SpO2: 97% 98% 98% 96%  Weight:      Height:        Intake/Output Summary (Last 24 hours) at 05/27/2019 1835 Last data filed at 05/27/2019 1752 Gross per 24 hour  Intake 1320 ml  Output 4400 ml  Net -3080 ml   Filed Weights   05/17/19 0400 05/18/19 0532 05/25/19 0500  Weight: 84.4 kg 81.6 kg 83.5 kg    Examination:  Constitutional: NAD, alert, answered questions more readily today HEENT: conjunctivae and lids normal, EOMI CV: RRR no M,R,G. Distal pulses +2.  No cyanosis.   RESP: CTA B/L, normal respiratory effort  GI: +BS, NTND Extremities: No effusions, edema, or tenderness in BLE SKIN: warm, dry and intact Neuro: II - XII grossly intact.  Sensation intact Psych: Flat mood and affect.   Foley in.   DVT prophylaxis:.  SCDs. Code Status: Full Family Communication:  Disposition Plan: Difficult disposition.  He was evaluated by a group home and they refused to take him due to complex medical needs. Social worker continues to look for a place for him.  Consultants:   Psychiatry  Urology  Procedures:  Antimicrobials:   Data Reviewed: I have personally reviewed following labs and imaging studies  CBC: Recent Labs  Lab 05/24/19 0554 05/26/19 0817 05/27/19 0612  WBC 6.6 6.3 5.5  HGB 9.5* 9.9* 10.2*  HCT 27.6* 28.9* 29.5*   MCV 87.6 88.1 87.0  PLT 173 160 0000000   Basic Metabolic Panel: Recent Labs  Lab 05/24/19 0554 05/26/19 0817 05/27/19 0612  NA 141 140 142  K 3.7 4.0 3.7  CL 100 100 102  CO2 34* 32 31  GLUCOSE 99 94 96  BUN 31* 38* 40*  CREATININE 0.94 0.93 0.94  CALCIUM 8.8* 8.8* 9.3  MG 1.7 1.9 1.7   GFR: Estimated Creatinine Clearance: 82.5 mL/min (by C-G formula based on SCr of 0.94 mg/dL). Liver Function Tests: No results for input(s): AST, ALT, ALKPHOS, BILITOT, PROT, ALBUMIN in the last 168 hours. No results for input(s): LIPASE, AMYLASE in the last 168 hours. No results for input(s): AMMONIA in the last 168 hours. Coagulation Profile: No results for input(s): INR, PROTIME in the last 168 hours. Cardiac Enzymes: No results for input(s): CKTOTAL, CKMB, CKMBINDEX, TROPONINI in the last 168 hours. BNP (last 3 results) No results for input(s): PROBNP in the last 8760 hours. HbA1C: No results for input(s): HGBA1C in the last 72 hours. CBG: No results for input(s): GLUCAP in the last 168 hours. Lipid Profile: No results for input(s): CHOL, HDL, LDLCALC, TRIG, CHOLHDL, LDLDIRECT in the last 72 hours. Thyroid Function Tests: No results for input(s): TSH, T4TOTAL, FREET4, T3FREE, THYROIDAB in the last 72 hours. Anemia Panel: No results for input(s): VITAMINB12, FOLATE, FERRITIN, TIBC, IRON, RETICCTPCT in the last 72 hours. Sepsis Labs: No results for input(s): PROCALCITON,  LATICACIDVEN in the last 168 hours.  No results found for this or any previous visit (from the past 240 hour(s)).   Radiology Studies: No results found.  Scheduled Meds: . atorvastatin  10 mg Oral Daily  . chlorhexidine  15 mL Mouth/Throat BID  . Chlorhexidine Gluconate Cloth  6 each Topical Daily  . escitalopram  10 mg Oral Daily  . feeding supplement (NEPRO CARB STEADY)  237 mL Oral TID BM  . feeding supplement (PRO-STAT SUGAR FREE 64)  30 mL Oral BID  . gabapentin  300 mg Oral TID  . mirtazapine  45 mg Oral  QHS  . multivitamin with minerals  1 tablet Oral Daily  . nystatin cream   Topical BID  . polyethylene glycol  17 g Oral BID  . risperiDONE  3 mg Oral BID  . senna-docusate  2 tablet Oral BID  . sodium chloride flush  10-40 mL Intracatheter Q12H  . tamsulosin  0.4 mg Oral Daily  . traZODone  150 mg Oral QHS   Continuous Infusions: . sodium chloride 250 mL (05/23/19 0309)     LOS: 83 days    Enzo Bi, MD [facility]  If 7PM-7AM, please contact night-coverage [email or web address]  05/27/2019, 6:35 PM

## 2019-05-27 NOTE — TOC Progression Note (Signed)
Transition of Care [facility]) - Progression Note    Patient Details  Name: [name] MRN: [id number] Date of Birth: 04/19/1960  Transition of Care [facility] CM/SW Contact  Shelbie Ammons, RN Phone Number: 05/27/2019, 8:53 AM  Clinical Narrative:   RNCM received phone call from Vanna [place] with [facility]. She relayed that she had spoke with [name] with [facility] group home, as well as patient's guardian [name] and they have both evaluated patient and both have concerns over his ability to live in a group home setting. Re-iterated to [place] that patient has been medically cleared and at this point the important thing is to find placement at whatever level they deem appropriate.  After some discussion [place] planned meeting between any hospital staff that would like to attend as well as patient's guardian, herself, ACT team and any others involved in his care. Meeting set for Tuesday April 20th at 1pm.     Expected Discharge Plan: Group Home Barriers to Discharge: SNF Pending bed offer  Expected Discharge Plan and Services Expected Discharge Plan: Group Home   Discharge Planning Services: CM Consult   Living arrangements for the past 2 months: Group Home                 DME Arranged: N/A DME Agency: [employer] Date DME Agency Contacted: 04/12/19 Time DME Agency Contacted: [phone] Representative spoke with at DME Agency: none needed [place] Arranged: NA           Social Determinants of Health ([place]) Interventions    Readmission Risk Interventions Readmission Risk Prevention Plan 04/27/2019 04/20/2019  Transportation Screening Complete Complete  PCP or Specialist Appt within 3-5 Days - Not Complete  Not Complete comments - not ready to [place] or Home Care Consult - Not Complete  HRI or Home Care Consult comments - not ready toDC, looking for a bed for placement  Social Work Consult for [place] Planning/Counseling - [place] - Not Applicable  Medication  Review (RN Care Manager) Referral to Pharmacy Referral to Pharmacy  PCP or Specialist appointment within 3-5 days of discharge Complete -  [place] or [place] Complete -  [place] Not Applicable -

## 2019-05-28 DIAGNOSIS — R7881 Bacteremia: Secondary | ICD-10-CM | POA: Diagnosis not present

## 2019-05-28 DIAGNOSIS — B952 Enterococcus as the cause of diseases classified elsewhere: Secondary | ICD-10-CM | POA: Diagnosis not present

## 2019-05-28 LAB — BASIC METABOLIC PANEL
Anion gap: 10 (ref 5–15)
BUN: 48 mg/dL — ABNORMAL HIGH (ref 8–23)
CO2: 33 mmol/L — ABNORMAL HIGH (ref 22–32)
Calcium: 9.2 mg/dL (ref 8.9–10.3)
Chloride: 100 mmol/L (ref 98–111)
Creatinine, Ser: 0.96 mg/dL (ref 0.61–1.24)
GFR calc Af Amer: >60 mL/min (ref >60)
GFR calc non Af Amer: >60 mL/min (ref >60)
Glucose, Bld: 98 mg/dL (ref 70–99)
Potassium: 3.8 mmol/L (ref 3.5–5.1)
Sodium: 143 mmol/L (ref 135–145)

## 2019-05-28 LAB — CBC
HCT: 29.9 % — ABNORMAL LOW (ref 39.0–52.0)
Hemoglobin: 9.9 g/dL — ABNORMAL LOW (ref 13.0–17.0)
MCH: 29.9 pg (ref 26.0–34.0)
MCHC: 33.1 g/dL (ref 30.0–36.0)
MCV: 90.3 fL (ref 80.0–100.0)
Platelets: 158 10*3/uL (ref 150–400)
RBC: 3.31 MIL/uL — ABNORMAL LOW (ref 4.22–5.81)
RDW: 13.6 % (ref 11.5–15.5)
WBC: 5.8 10*3/uL (ref 4.0–10.5)
nRBC: 0 % (ref 0.0–0.2)

## 2019-05-28 LAB — MAGNESIUM: Magnesium: 2 mg/dL (ref 1.7–2.4)

## 2019-05-28 NOTE — Progress Notes (Signed)
PROGRESS NOTE    Peter Parsons  [id number] DOB: 20-Jul-1960 DOA: 02/11/2019 PCP: System, Provider Not In   Brief Narrative:  59 year old male with chronic schizophrenia with frequent suicidal ideations, bipolar disorder with depression, type 2 diabetes mellitus (controlled), CKD stage II, peripheral neuropathy, BPH, hypertension presented with altered mental status.  Prolonged hospital stay awaiting placement in a memory care unit.  Does not have capacity.  Hospital course/significant events 3/31: Prolonged hospital stay of almost 6 weeks awaiting for placement.  Foley discontinued after prolonged use in the hospital (suspected was placed due to bladder outlet obstruction).  Intermittent bladder scan showing urinary retention. 4/1: Foley placed in due to urinary retention noted on repeat bladder scans.  Renal ultrasound done showed distended bladder with Foley not in the bladder. 4/2: Patient found to have gross hematuria with clots and Foley removed.  Urology consulted and a 64 French coud catheter placed.  Patient became septic with hypotension, fever and tachycardia.  Received 5 L normal saline bolus with minimal improvement and transferred to stepdown unit.  Started on Neo-Synephrine through peripheral IV and given 1 unit PRBC for acute blood loss anemia. 4/3: Stable off pressors and transferred back to medical floor.  Subjective: Pt woke up with me approaching, but didn't want to talk.  No noted fever, N/V/D.   Assessment & Plan:   Principal Problem:   Enterococcal bacteremia Active Problems:   Paranoid schizophrenia ([place])   Altered mental status   Dementia ([place])   Weakness   Acute metabolic encephalopathy   Protein-calorie malnutrition, severe   Pressure injury of skin   Hypertension   Type 2 diabetes mellitus (HCC)   Dyslipidemia   BPH (benign prostatic hyperplasia)   Normocytic anemia   Thrombocytopenia (HCC)   Acute urinary retention   Urethral trauma   UTI  (urinary tract infection)  Severe sepsis with septic shock.  Resolved. Secondary to catheter associated UTI. Patient had multiple straight caths done past few days and Foley placed in on 4/1. Transferred to stepdown unit and multiple IV normal saline bolus received.  Required Neo-Synephrine overnight and now stable off pressors.  Sepsis resolved. Blood and urine culture growing Enterococcus faecalis. Antibiotic narrowed to ampicillin -completed course.  Constipation. -Miralax BID  Fungal rash on his face after shaving of beard.  -Try keeping that area dry. -Nystatin cream  Hypokalemia/hypomagnesemia.  Resolved -Replete electrolytes as needed and monitor.  Gross hematuria, resolved Secondary to traumatic catheterization.  Foley removed and urology placed in a 20 [place] coud Foley without resistance.  Recommend intermittent irrigation, keep Foley upon discharge and follow-up with urology in [place]. Urine now clear.  Received 1 unit PRBC for drop in H&H.  BPH with urinary retention, now with chronic Foley Continue Foley-Foley was placed by urology after having a traumatic urethral injury and they advised to keep the Foley in until he will follow-up with them as an outpatient in 2 weeks. -Failed voiding trail, Coude Foley re-inserted on 4/13.  Chronic schizophrenia Continue paliperidone, Klonopin, mirtazapine and risperidone.  History of frequent suicidal ideation.  Does not have capacity.  Outpatient psychiatry follow-up.  Depression with bipolar disorder Continue Lexapro and trazodone.  Acute on chronic kidney disease stage II Likely ATN with sepsis + bladder outlet obstruction.    Type 2 diabetes mellitus, controlled A1c of 4.5.  CBG stable.  Thrombocytopenia Unclear etiology.  Platelet stable.  Discontinued ibuprofen.  Peripheral neuropathy Continue gabapentin  Hypertension/ hyperlipidemia. BP within goal. Off all blood pressure meds. Continue  statin.  Dysphagia/dysarthria and left facial droop MRI negative for CVA.  Continue dysphagia diet  Severe protein calorie malnutrition Continue nutrition supplement and Remeron  Failure to thrive PT recommends home health with 24-hour supervision.  Awaiting placement.   Objective: Vitals:   05/28/19 0026 05/28/19 0453 05/28/19 0920 05/28/19 1637  BP: 121/68 108/63 113/75 130/61  Pulse: 88 60 (!) 51 76  Resp: 20 14 14 18   Temp: 98.1 F (36.7 C) 98.3 F (36.8 C) 98.3 F (36.8 C) 98.5 F (36.9 C)  TempSrc: Oral Oral  Oral  SpO2: 96% 96% 97% 95%  Weight:      Height:        Intake/Output Summary (Last 24 hours) at 05/28/2019 1844 Last data filed at 05/28/2019 1730 Gross per 24 hour  Intake 360 ml  Output 1175 ml  Net -815 ml   Filed Weights   05/17/19 0400 05/18/19 0532 05/25/19 0500  Weight: 84.4 kg 81.6 kg 83.5 kg    Examination:  Constitutional: NAD, sleeping but easily arousable, not engaged today HEENT: conjunctivae and lids normal, EOMI CV: RRR no M,R,G. Distal pulses +2.  No cyanosis.   RESP: CTA B/L, normal respiratory effort  GI: +BS, NTND Extremities: No effusions, edema, or tenderness in BLE SKIN: warm, dry and intact Neuro: II - XII grossly intact.  Sensation intact Psych: Flat mood and affect.   Foley in.   DVT prophylaxis:.  SCDs. Code Status: Full Family Communication:  Disposition Plan: Difficult disposition.  He was evaluated by a group home and they refused to take him due to complex medical needs. Social worker continues to look for a place for him.  Consultants:   Psychiatry  Urology  Procedures:  Antimicrobials:   Data Reviewed: I have personally reviewed following labs and imaging studies  CBC: Recent Labs  Lab 05/24/19 0554 05/26/19 0817 05/27/19 0612 05/28/19 0447  WBC 6.6 6.3 5.5 5.8  HGB 9.5* 9.9* 10.2* 9.9*  HCT 27.6* 28.9* 29.5* 29.9*  MCV 87.6 88.1 87.0 90.3  PLT 173 160 153 0000000   Basic Metabolic  Panel: Recent Labs  Lab 05/24/19 0554 05/26/19 0817 05/27/19 0612 05/28/19 0447  NA 141 140 142 143  K 3.7 4.0 3.7 3.8  CL 100 100 102 100  CO2 34* 32 31 33*  GLUCOSE 99 94 96 98  BUN 31* 38* 40* 48*  CREATININE 0.94 0.93 0.94 0.96  CALCIUM 8.8* 8.8* 9.3 9.2  MG 1.7 1.9 1.7 2.0   GFR: Estimated Creatinine Clearance: 80.8 mL/min (by C-G formula based on SCr of 0.96 mg/dL). Liver Function Tests: No results for input(s): AST, ALT, ALKPHOS, BILITOT, PROT, ALBUMIN in the last 168 hours. No results for input(s): LIPASE, AMYLASE in the last 168 hours. No results for input(s): AMMONIA in the last 168 hours. Coagulation Profile: No results for input(s): INR, PROTIME in the last 168 hours. Cardiac Enzymes: No results for input(s): CKTOTAL, CKMB, CKMBINDEX, TROPONINI in the last 168 hours. BNP (last 3 results) No results for input(s): PROBNP in the last 8760 hours. HbA1C: No results for input(s): HGBA1C in the last 72 hours. CBG: No results for input(s): GLUCAP in the last 168 hours. Lipid Profile: No results for input(s): CHOL, HDL, LDLCALC, TRIG, CHOLHDL, LDLDIRECT in the last 72 hours. Thyroid Function Tests: No results for input(s): TSH, T4TOTAL, FREET4, T3FREE, THYROIDAB in the last 72 hours. Anemia Panel: No results for input(s): VITAMINB12, FOLATE, FERRITIN, TIBC, IRON, RETICCTPCT in the last 72 hours. Sepsis Labs: No results  for input(s): PROCALCITON, LATICACIDVEN in the last 168 hours.  No results found for this or any previous visit (from the past 240 hour(s)).   Radiology Studies: No results found.  Scheduled Meds: . atorvastatin  10 mg Oral Daily  . chlorhexidine  15 mL Mouth/Throat BID  . Chlorhexidine Gluconate Cloth  6 each Topical Daily  . escitalopram  10 mg Oral Daily  . feeding supplement (NEPRO CARB STEADY)  237 mL Oral TID BM  . feeding supplement (PRO-STAT SUGAR FREE 64)  30 mL Oral BID  . gabapentin  300 mg Oral TID  . mirtazapine  45 mg Oral QHS  .  multivitamin with minerals  1 tablet Oral Daily  . nystatin cream   Topical BID  . polyethylene glycol  17 g Oral BID  . risperiDONE  3 mg Oral BID  . senna-docusate  2 tablet Oral BID  . sodium chloride flush  10-40 mL Intracatheter Q12H  . tamsulosin  0.4 mg Oral Daily  . traZODone  150 mg Oral QHS   Continuous Infusions: . sodium chloride 250 mL (05/23/19 0309)     LOS: 60 days    Enzo Bi, MD [facility]  If 7PM-7AM, please contact night-coverage [email or web address]  05/28/2019, 6:44 PM

## 2019-05-29 DIAGNOSIS — R7881 Bacteremia: Secondary | ICD-10-CM | POA: Diagnosis not present

## 2019-05-29 DIAGNOSIS — B952 Enterococcus as the cause of diseases classified elsewhere: Secondary | ICD-10-CM | POA: Diagnosis not present

## 2019-05-29 NOTE — Progress Notes (Signed)
PROGRESS NOTE    Peter Parsons  [id number] DOB: 1960-07-14 DOA: 02/11/2019 PCP: System, Provider Not In   Brief Narrative:  Peter Parsons is a 59 year old male with chronic schizophrenia with frequent suicidal ideations, bipolar disorder with depression, type 2 diabetes mellitus (controlled), CKD stage II, peripheral neuropathy, BPH, hypertension presented with altered mental status.  Prolonged hospital stay awaiting placement in a memory care unit.  Does not have capacity.  Hospital course/significant events 3/31: Prolonged hospital stay of almost 6 weeks awaiting for placement.  Foley discontinued after prolonged use in the hospital (suspected was placed due to bladder outlet obstruction).  Intermittent bladder scan showing urinary retention. 4/1: Foley placed in due to urinary retention noted on repeat bladder scans.  Renal ultrasound done showed distended bladder with Foley not in the bladder. 4/2: Patient found to have gross hematuria with clots and Foley removed.  Urology consulted and a 74 French coud catheter placed.  Patient became septic with hypotension, fever and tachycardia.  Received 5 L normal saline bolus with minimal improvement and transferred to stepdown unit.  Started on Neo-Synephrine through peripheral IV and given 1 unit PRBC for acute blood loss anemia. 4/3: Stable off pressors and transferred back to medical floor.  Subjective: Pt again was found with eyes closed but woke up right away when being approached.  Cracked a little smile today but didn't say much.  No fever, dyspnea, N/V/D, dysuria.   Assessment & Plan:   Principal Problem:   Enterococcal bacteremia Active Problems:   Paranoid schizophrenia ([place])   Altered mental status   Dementia ([place])   Weakness   Acute metabolic encephalopathy   Protein-calorie malnutrition, severe   Pressure injury of skin   Hypertension   Type 2 diabetes mellitus (HCC)   Dyslipidemia   BPH (benign prostatic  hyperplasia)   Normocytic anemia   Thrombocytopenia (H[facility])   Acute urinary retention   Urethral trauma   UTI (urinary tract infection)  Severe sepsis with septic shock.  Resolved. Secondary to catheter associated UTI. Patient had multiple straight caths done past few days and Foley placed in on 4/1. Transferred to stepdown unit and multiple IV normal saline bolus received.  Required Neo-Synephrine overnight and now stable off pressors.  Sepsis resolved. Blood and urine culture growing Enterococcus faecalis. Antibiotic narrowed to ampicillin -completed course.  Constipation. -Miralax BID  Fungal rash on his face after shaving of beard.  -Try keeping that area dry. -Nystatin cream  Hypokalemia/hypomagnesemia.  Resolved -Replete electrolytes as needed and monitor.  Gross hematuria, resolved Secondary to traumatic catheterization.  Foley removed and urology placed in a 20 [place] coud Foley without resistance.  Recommend intermittent irrigation, keep Foley upon discharge and follow-up with urology in [place]. Urine now clear.  Received 1 unit PRBC for drop in H&H.  BPH with urinary retention, now with chronic Foley Continue Foley-Foley was placed by urology after having a traumatic urethral injury and they advised to keep the Foley in until he will follow-up with them as an outpatient in 2 weeks. -Failed voiding trail, Coude Foley re-inserted on 4/13.  Chronic schizophrenia Continue paliperidone, Klonopin, mirtazapine and risperidone.  History of frequent suicidal ideation.  Does not have capacity.  Outpatient psychiatry follow-up.  Depression with bipolar disorder Continue Lexapro and trazodone.  Acute on chronic kidney disease stage II Likely ATN with sepsis + bladder outlet obstruction.    Type 2 diabetes mellitus, controlled A1c of 4.5.  CBG stable.  Thrombocytopenia Unclear etiology.  Platelet stable.  Discontinued  ibuprofen.  Peripheral neuropathy Continue  gabapentin  Hypertension/ hyperlipidemia. BP within goal. Off all blood pressure meds. Continue statin.  Dysphagia/dysarthria and left facial droop MRI negative for CVA.  Continue dysphagia diet  Severe protein calorie malnutrition Continue nutrition supplement and Remeron  Failure to thrive PT recommends home health with 24-hour supervision.  Awaiting placement.   Objective: Vitals:   05/28/19 0920 05/28/19 1637 05/29/19 0028 05/29/19 0806  BP: 113/75 130/61 106/65 122/74  Pulse: (!) 51 76 72 73  Resp: 14 18 16 18   Temp: 98.3 F (36.8 C) 98.5 F (36.9 C) 98.1 F (36.7 C) 98.3 F (36.8 C)  TempSrc:  Oral Oral   SpO2: 97% 95% 95% 96%  Weight:      Height:        Intake/Output Summary (Last 24 hours) at 05/29/2019 1541 Last data filed at 05/29/2019 0900 Gross per 24 hour  Intake 120 ml  Output 2400 ml  Net -2280 ml   Filed Weights   05/17/19 0400 05/18/19 0532 05/25/19 0500  Weight: 84.4 kg 81.6 kg 83.5 kg    Examination:  Constitutional: NAD, sleeping but easily arousable, not engaged today HEENT: conjunctivae and lids normal, EOMI CV: RRR no M,R,G. Distal pulses +2.  No cyanosis.   RESP: CTA B/L, normal respiratory effort  GI: +BS, NTND Extremities: No effusions, edema, or tenderness in BLE SKIN: warm, dry and intact Neuro: II - XII grossly intact.  Sensation intact Psych: Flat mood and affect.   Foley in.   DVT prophylaxis:.  SCDs. Code Status: Full Family Communication:  Disposition Plan: Difficult disposition.  He was evaluated by a group home and they refused to take him due to complex medical needs. Social worker continues to look for a place for him.  Consultants:   Psychiatry  Urology  Procedures:  Antimicrobials:   Data Reviewed: I have personally reviewed following labs and imaging studies  CBC: Recent Labs  Lab 05/24/19 0554 05/26/19 0817 05/27/19 0612 05/28/19 0447  WBC 6.6 6.3 5.5 5.8  HGB 9.5* 9.9* 10.2* 9.9*  HCT  27.6* 28.9* 29.5* 29.9*  MCV 87.6 88.1 87.0 90.3  PLT 173 160 153 0000000   Basic Metabolic Panel: Recent Labs  Lab 05/24/19 0554 05/26/19 0817 05/27/19 0612 05/28/19 0447  NA 141 140 142 143  K 3.7 4.0 3.7 3.8  CL 100 100 102 100  CO2 34* 32 31 33*  GLUCOSE 99 94 96 98  BUN 31* 38* 40* 48*  CREATININE 0.94 0.93 0.94 0.96  CALCIUM 8.8* 8.8* 9.3 9.2  MG 1.7 1.9 1.7 2.0   GFR: Estimated Creatinine Clearance: 80.8 mL/min (by C-G formula based on SCr of 0.96 mg/dL). Liver Function Tests: No results for input(s): AST, ALT, ALKPHOS, BILITOT, PROT, ALBUMIN in the last 168 hours. No results for input(s): LIPASE, AMYLASE in the last 168 hours. No results for input(s): AMMONIA in the last 168 hours. Coagulation Profile: No results for input(s): INR, PROTIME in the last 168 hours. Cardiac Enzymes: No results for input(s): CKTOTAL, CKMB, CKMBINDEX, TROPONINI in the last 168 hours. BNP (last 3 results) No results for input(s): PROBNP in the last 8760 hours. HbA1C: No results for input(s): HGBA1C in the last 72 hours. CBG: No results for input(s): GLUCAP in the last 168 hours. Lipid Profile: No results for input(s): CHOL, HDL, LDLCALC, TRIG, CHOLHDL, LDLDIRECT in the last 72 hours. Thyroid Function Tests: No results for input(s): TSH, T4TOTAL, FREET4, T3FREE, THYROIDAB in the last 72 hours. Anemia Panel:  No results for input(s): VITAMINB12, FOLATE, FERRITIN, TIBC, IRON, RETICCTPCT in the last 72 hours. Sepsis Labs: No results for input(s): PROCALCITON, LATICACIDVEN in the last 168 hours.  No results found for this or any previous visit (from the past 240 hour(s)).   Radiology Studies: No results found.  Scheduled Meds: . atorvastatin  10 mg Oral Daily  . chlorhexidine  15 mL Mouth/Throat BID  . Chlorhexidine Gluconate Cloth  6 each Topical Daily  . escitalopram  10 mg Oral Daily  . feeding supplement (NEPRO CARB STEADY)  237 mL Oral TID BM  . feeding supplement (PRO-STAT SUGAR  FREE 64)  30 mL Oral BID  . gabapentin  300 mg Oral TID  . mirtazapine  45 mg Oral QHS  . multivitamin with minerals  1 tablet Oral Daily  . nystatin cream   Topical BID  . polyethylene glycol  17 g Oral BID  . risperiDONE  3 mg Oral BID  . senna-docusate  2 tablet Oral BID  . sodium chloride flush  10-40 mL Intracatheter Q12H  . tamsulosin  0.4 mg Oral Daily  . traZODone  150 mg Oral QHS   Continuous Infusions: . sodium chloride 250 mL (05/23/19 0309)     LOS: 61 days    Enzo Bi, MD [facility]  If 7PM-7AM, please contact night-coverage [email or web address]  05/29/2019, 3:41 PM

## 2019-05-30 DIAGNOSIS — R7881 Bacteremia: Secondary | ICD-10-CM | POA: Diagnosis not present

## 2019-05-30 DIAGNOSIS — B952 Enterococcus as the cause of diseases classified elsewhere: Secondary | ICD-10-CM | POA: Diagnosis not present

## 2019-05-30 NOTE — Progress Notes (Signed)
PROGRESS NOTE    Peter Parsons  [id number] DOB: 1960-11-19 DOA: 02/11/2019 PCP: System, Provider Not In   Brief Narrative:  Peter Parsons is a 59 year old male with chronic schizophrenia with frequent suicidal ideations, bipolar disorder with depression, type 2 diabetes mellitus (controlled), CKD stage II, peripheral neuropathy, BPH, hypertension presented with altered mental status.  Prolonged hospital stay awaiting placement in a memory care unit.  Does not have capacity.  Hospital course/significant events 3/31: Prolonged hospital stay of almost 6 weeks awaiting for placement.  Foley discontinued after prolonged use in the hospital (suspected was placed due to bladder outlet obstruction).  Intermittent bladder scan showing urinary retention. 4/1: Foley placed in due to urinary retention noted on repeat bladder scans.  Renal ultrasound done showed distended bladder with Foley not in the bladder. 4/2: Patient found to have gross hematuria with clots and Foley removed.  Urology consulted and a 24 French coud catheter placed.  Patient became septic with hypotension, fever and tachycardia.  Received 5 L normal saline bolus with minimal improvement and transferred to stepdown unit.  Started on Neo-Synephrine through peripheral IV and given 1 unit PRBC for acute blood loss anemia. 4/3: Stable off pressors and transferred back to medical floor.  Subjective: Pt reported having BM's and sleeping ok at nights.  No fever, dyspnea, chest pain, abdominal pain, N/V/D.   Assessment & Plan:   Principal Problem:   Enterococcal bacteremia Active Problems:   Paranoid schizophrenia ([place])   Altered mental status   Dementia ([place])   Weakness   Acute metabolic encephalopathy   Protein-calorie malnutrition, severe   Pressure injury of skin   Hypertension   Type 2 diabetes mellitus (HCC)   Dyslipidemia   BPH (benign prostatic hyperplasia)   Normocytic anemia   Thrombocytopenia (H[facility])   Acute  urinary retention   Urethral trauma   UTI (urinary tract infection)  Severe sepsis with septic shock.  Resolved. Secondary to catheter associated UTI. Patient had multiple straight caths done past few days and Foley placed in on 4/1. Transferred to stepdown unit and multiple IV normal saline bolus received.  Required Neo-Synephrine overnight and now stable off pressors.  Sepsis resolved. Blood and urine culture growing Enterococcus faecalis. Antibiotic narrowed to ampicillin -completed course.  Constipation. -Miralax BID  Fungal rash on his face after shaving of beard.  -Try keeping that area dry. -Nystatin cream  Hypokalemia/hypomagnesemia.  Resolved -Replete electrolytes as needed and monitor.  Gross hematuria, resolved Secondary to traumatic catheterization.  Foley removed and urology placed in a 20 [place] coud Foley without resistance.  Recommend intermittent irrigation, keep Foley upon discharge and follow-up with urology in [place]. Urine now clear.  Received 1 unit PRBC for drop in H&H.  BPH with urinary retention, now with chronic Foley Continue Foley-Foley was placed by urology after having a traumatic urethral injury and they advised to keep the Foley in until he will follow-up with them as an outpatient in 2 weeks. -Failed voiding trail, Coude Foley re-inserted on 4/13.  Chronic schizophrenia Continue paliperidone, Klonopin, mirtazapine and risperidone.  History of frequent suicidal ideation.  Does not have capacity.  Outpatient psychiatry follow-up.  Depression with bipolar disorder Continue Lexapro and trazodone.  Acute on chronic kidney disease stage II Likely ATN with sepsis + bladder outlet obstruction.    Type 2 diabetes mellitus, controlled A1c of 4.5.  CBG stable.  Thrombocytopenia Unclear etiology.  Platelet stable.  Discontinued ibuprofen.  Peripheral neuropathy Continue gabapentin  Hypertension/ hyperlipidemia. BP within goal. Off  all  blood pressure meds. Continue statin.  Dysphagia/dysarthria and left facial droop MRI negative for CVA.  Continue dysphagia diet  Severe protein calorie malnutrition Continue nutrition supplement and Remeron  Failure to thrive PT recommends home health with 24-hour supervision.  Awaiting placement.   Objective: Vitals:   05/29/19 1600 05/30/19 0018 05/30/19 0807 05/30/19 1439  BP: 115/70 101/62 117/72 111/64  Pulse: 80 79 69 66  Resp: 17 15 18 19   Temp: 98.2 F (36.8 C) 97.9 F (36.6 C) 98.3 F (36.8 C) 99.3 F (37.4 C)  TempSrc: Oral Oral Oral Oral  SpO2: 98% 99% 97% 95%  Weight:      Height:        Intake/Output Summary (Last 24 hours) at 05/30/2019 1501 Last data filed at 05/30/2019 1045 Gross per 24 hour  Intake 0 ml  Output 3350 ml  Net -3350 ml   Filed Weights   05/17/19 0400 05/18/19 0532 05/25/19 0500  Weight: 84.4 kg 81.6 kg 83.5 kg    Examination:  Constitutional: NAD, sleeping but easily arousable HEENT: conjunctivae and lids normal, EOMI, head always tilted to the right CV: RRR no M,R,G. Distal pulses +2.  No cyanosis.   RESP: CTA B/L, normal respiratory effort  GI: +BS, NTND Extremities: Mild swelling in both feet SKIN: warm, dry and intact Neuro: II - XII grossly intact.  Sensation intact Psych: Flat mood and affect.   Foley in.   DVT prophylaxis:.  SCDs. Code Status: Full Family Communication:  Disposition Plan: Difficult disposition.  He was evaluated by a group home and they refused to take him due to complex medical needs. Social worker continues to look for a place for him.  Consultants:   Psychiatry  Urology  Procedures:  Antimicrobials:   Data Reviewed: I have personally reviewed following labs and imaging studies  CBC: Recent Labs  Lab 05/24/19 0554 05/26/19 0817 05/27/19 0612 05/28/19 0447  WBC 6.6 6.3 5.5 5.8  HGB 9.5* 9.9* 10.2* 9.9*  HCT 27.6* 28.9* 29.5* 29.9*  MCV 87.6 88.1 87.0 90.3  PLT 173 160 153 158     Basic Metabolic Panel: Recent Labs  Lab 05/24/19 0554 05/26/19 0817 05/27/19 0612 05/28/19 0447  NA 141 140 142 143  K 3.7 4.0 3.7 3.8  CL 100 100 102 100  CO2 34* 32 31 33*  GLUCOSE 99 94 96 98  BUN 31* 38* 40* 48*  CREATININE 0.94 0.93 0.94 0.96  CALCIUM 8.8* 8.8* 9.3 9.2  MG 1.7 1.9 1.7 2.0   GFR: Estimated Creatinine Clearance: 80.8 mL/min (by C-G formula based on SCr of 0.96 mg/dL). Liver Function Tests: No results for input(s): AST, ALT, ALKPHOS, BILITOT, PROT, ALBUMIN in the last 168 hours. No results for input(s): LIPASE, AMYLASE in the last 168 hours. No results for input(s): AMMONIA in the last 168 hours. Coagulation Profile: No results for input(s): INR, PROTIME in the last 168 hours. Cardiac Enzymes: No results for input(s): CKTOTAL, CKMB, CKMBINDEX, TROPONINI in the last 168 hours. BNP (last 3 results) No results for input(s): PROBNP in the last 8760 hours. HbA1C: No results for input(s): HGBA1C in the last 72 hours. CBG: No results for input(s): GLUCAP in the last 168 hours. Lipid Profile: No results for input(s): CHOL, HDL, LDLCALC, TRIG, CHOLHDL, LDLDIRECT in the last 72 hours. Thyroid Function Tests: No results for input(s): TSH, T4TOTAL, FREET4, T3FREE, THYROIDAB in the last 72 hours. Anemia Panel: No results for input(s): VITAMINB12, FOLATE, FERRITIN, TIBC, IRON, RETICCTPCT in the  last 72 hours. Sepsis Labs: No results for input(s): PROCALCITON, LATICACIDVEN in the last 168 hours.  No results found for this or any previous visit (from the past 240 hour(s)).   Radiology Studies: No results found.  Scheduled Meds: . atorvastatin  10 mg Oral Daily  . chlorhexidine  15 mL Mouth/Throat BID  . Chlorhexidine Gluconate Cloth  6 each Topical Daily  . escitalopram  10 mg Oral Daily  . feeding supplement (NEPRO CARB STEADY)  237 mL Oral TID BM  . feeding supplement (PRO-STAT SUGAR FREE 64)  30 mL Oral BID  . gabapentin  300 mg Oral TID  . mirtazapine   45 mg Oral QHS  . multivitamin with minerals  1 tablet Oral Daily  . nystatin cream   Topical BID  . polyethylene glycol  17 g Oral BID  . risperiDONE  3 mg Oral BID  . senna-docusate  2 tablet Oral BID  . sodium chloride flush  10-40 mL Intracatheter Q12H  . tamsulosin  0.4 mg Oral Daily  . traZODone  150 mg Oral QHS   Continuous Infusions: . sodium chloride 250 mL (05/23/19 0309)     LOS: 62 days    Enzo Bi, MD [facility]  If 7PM-7AM, please contact night-coverage [email or web address]  05/30/2019, 3:01 PM

## 2019-05-31 DIAGNOSIS — B952 Enterococcus as the cause of diseases classified elsewhere: Secondary | ICD-10-CM | POA: Diagnosis not present

## 2019-05-31 DIAGNOSIS — R7881 Bacteremia: Secondary | ICD-10-CM | POA: Diagnosis not present

## 2019-05-31 LAB — GLUCOSE, CAPILLARY: Glucose-Capillary: 91 mg/dL (ref 70–99)

## 2019-05-31 NOTE — Progress Notes (Signed)
PROGRESS NOTE    Peter Parsons  [id number] DOB: [birth year] DOA: 02/11/2019 PCP: System, Provider Not In   Brief Narrative:  Peter Parsons is a 59 year old male with chronic schizophrenia with frequent suicidal ideations, bipolar disorder with depression, type 2 diabetes mellitus (controlled), CKD stage II, peripheral neuropathy, BPH, hypertension presented with altered mental status.  Prolonged hospital stay awaiting placement in a memory care unit.  Does not have capacity.  Hospital course/significant events 3/31: Prolonged hospital stay of almost 6 weeks awaiting for placement.  Foley discontinued after prolonged use in the hospital (suspected was placed due to bladder outlet obstruction).  Intermittent bladder scan showing urinary retention. 4/1: Foley placed in due to urinary retention noted on repeat bladder scans.  Renal ultrasound done showed distended bladder with Foley not in the bladder. 4/2: Patient found to have gross hematuria with clots and Foley removed.  Urology consulted and a 24 French coud catheter placed.  Patient became septic with hypotension, fever and tachycardia.  Received 5 L normal saline bolus with minimal improvement and transferred to stepdown unit.  Started on Neo-Synephrine through peripheral IV and given 1 unit PRBC for acute blood loss anemia. 4/3: Stable off pressors and transferred back to medical floor.  Subjective: Seen this morning, pt not engaged at all.  Woke up to see who was approaching, and then went back to sleep.  No fever, N/V/D.    Assessment & Plan:   Principal Problem:   Enterococcal bacteremia Active Problems:   Paranoid schizophrenia ([place])   Altered mental status   Dementia ([place])   Weakness   Acute metabolic encephalopathy   Protein-calorie malnutrition, severe   Pressure injury of skin   Hypertension   Type 2 diabetes mellitus (HCC)   Dyslipidemia   BPH (benign prostatic hyperplasia)   Normocytic anemia   Thrombocytopenia  (HCC)   Acute urinary retention   Urethral trauma   UTI (urinary tract infection)  Severe sepsis with septic shock.  Resolved. Secondary to catheter associated UTI. Patient had multiple straight caths done past few days and Foley placed in on 4/1. Transferred to stepdown unit and multiple IV normal saline bolus received.  Required Neo-Synephrine overnight and now stable off pressors.  Sepsis resolved. Blood and urine culture growing Enterococcus faecalis. Antibiotic narrowed to ampicillin -completed course.  Constipation. -Miralax BID  Fungal rash on his face after shaving of beard.  -Try keeping that area dry. -Nystatin cream  Hypokalemia/hypomagnesemia.  Resolved -Replete electrolytes as needed and monitor.  Gross hematuria, resolved Secondary to traumatic catheterization.  Foley removed and urology placed in a 20 [place] coud Foley without resistance.  Recommend intermittent irrigation, keep Foley upon discharge and follow-up with urology in [place]. Urine now clear.  Received 1 unit PRBC for drop in H&H.  BPH with urinary retention, now with chronic Foley Continue Foley-Foley was placed by urology after having a traumatic urethral injury and they advised to keep the Foley in until he will follow-up with them as an outpatient in 2 weeks. -Failed voiding trail, Coude Foley re-inserted on 4/13.  Chronic schizophrenia Continue paliperidone, Klonopin, mirtazapine and risperidone.  History of frequent suicidal ideation.  Does not have capacity.  Outpatient psychiatry follow-up.  Depression with bipolar disorder Continue Lexapro and trazodone.  Acute on chronic kidney disease stage II Likely ATN with sepsis + bladder outlet obstruction.    Type 2 diabetes mellitus, controlled A1c of 4.5.  CBG stable.  Thrombocytopenia Unclear etiology.  Platelet stable.  Discontinued ibuprofen.  Peripheral neuropathy  Continue gabapentin  Hypertension/ hyperlipidemia. BP within  goal. Off all blood pressure meds. Continue statin.  Dysphagia/dysarthria and left facial droop MRI negative for CVA.  Continue dysphagia diet  Severe protein calorie malnutrition Continue nutrition supplement and Remeron  Failure to thrive PT recommends home health with 24-hour supervision.  Awaiting placement.   Objective: Vitals:   05/30/19 0807 05/30/19 1439 05/31/19 0002 05/31/19 0728  BP: 117/72 111/64 120/66 110/68  Pulse: 69 66 69 (!) 58  Resp: 18 19 16 16   Temp: 98.3 F (36.8 C) 99.3 F (37.4 C) 99.3 F (37.4 C) 98.5 F (36.9 C)  TempSrc: Oral Oral Axillary Oral  SpO2: 97% 95% 96% 96%  Weight:      Height:        Intake/Output Summary (Last 24 hours) at 05/31/2019 1626 Last data filed at 05/31/2019 0946 Gross per 24 hour  Intake 0 ml  Output 1950 ml  Net -1950 ml   Filed Weights   05/17/19 0400 05/18/19 0532 05/25/19 0500  Weight: 84.4 kg 81.6 kg 83.5 kg    Examination:  Constitutional: NAD, sleeping but easily arousable, no responding to questions today HEENT: conjunctivae and lids normal, EOMI, head always tilted to the right CV: RRR no M,R,G. Distal pulses +2.  No cyanosis.   RESP: CTA B/L, normal respiratory effort  GI: +BS, NTND Extremities: Mild swelling in both feet SKIN: warm, dry and intact Neuro: II - XII grossly intact.  Sensation intact Foley in.   DVT prophylaxis:.  SCDs. Code Status: Full Family Communication:  Disposition Plan: Difficult disposition.  He was evaluated by a group home and they refused to take him due to complex medical needs. Social worker continues to look for a place for him.  Consultants:   Psychiatry  Urology  Procedures:  Antimicrobials:   Data Reviewed: I have personally reviewed following labs and imaging studies  CBC: Recent Labs  Lab 05/26/19 0817 05/27/19 0612 05/28/19 0447  WBC 6.3 5.5 5.8  HGB 9.9* 10.2* 9.9*  HCT 28.9* 29.5* 29.9*  MCV 88.1 87.0 90.3  PLT 160 153 0000000   Basic  Metabolic Panel: Recent Labs  Lab 05/26/19 0817 05/27/19 0612 05/28/19 0447  NA 140 142 143  K 4.0 3.7 3.8  CL 100 102 100  CO2 32 31 33*  GLUCOSE 94 96 98  BUN 38* 40* 48*  CREATININE 0.93 0.94 0.96  CALCIUM 8.8* 9.3 9.2  MG 1.9 1.7 2.0   GFR: Estimated Creatinine Clearance: 80.8 mL/min (by C-G formula based on SCr of 0.96 mg/dL). Liver Function Tests: No results for input(s): AST, ALT, ALKPHOS, BILITOT, PROT, ALBUMIN in the last 168 hours. No results for input(s): LIPASE, AMYLASE in the last 168 hours. No results for input(s): AMMONIA in the last 168 hours. Coagulation Profile: No results for input(s): INR, PROTIME in the last 168 hours. Cardiac Enzymes: No results for input(s): CKTOTAL, CKMB, CKMBINDEX, TROPONINI in the last 168 hours. BNP (last 3 results) No results for input(s): PROBNP in the last 8760 hours. HbA1C: No results for input(s): HGBA1C in the last 72 hours. CBG: Recent Labs  Lab 05/31/19 0729  GLUCAP 91   Lipid Profile: No results for input(s): CHOL, HDL, LDLCALC, TRIG, CHOLHDL, LDLDIRECT in the last 72 hours. Thyroid Function Tests: No results for input(s): TSH, T4TOTAL, FREET4, T3FREE, THYROIDAB in the last 72 hours. Anemia Panel: No results for input(s): VITAMINB12, FOLATE, FERRITIN, TIBC, IRON, RETICCTPCT in the last 72 hours. Sepsis Labs: No results for input(s): PROCALCITON,  LATICACIDVEN in the last 168 hours.  No results found for this or any previous visit (from the past 240 hour(s)).   Radiology Studies: No results found.  Scheduled Meds: . atorvastatin  10 mg Oral Daily  . chlorhexidine  15 mL Mouth/Throat BID  . Chlorhexidine Gluconate Cloth  6 each Topical Daily  . escitalopram  10 mg Oral Daily  . feeding supplement (NEPRO CARB STEADY)  237 mL Oral TID BM  . feeding supplement (PRO-STAT SUGAR FREE 64)  30 mL Oral BID  . gabapentin  300 mg Oral TID  . mirtazapine  45 mg Oral QHS  . multivitamin with minerals  1 tablet Oral Daily   . nystatin cream   Topical BID  . polyethylene glycol  17 g Oral BID  . risperiDONE  3 mg Oral BID  . senna-docusate  2 tablet Oral BID  . sodium chloride flush  10-40 mL Intracatheter Q12H  . tamsulosin  0.4 mg Oral Daily  . traZODone  150 mg Oral QHS   Continuous Infusions: . sodium chloride 250 mL (05/23/19 0309)     LOS: 63 days    Enzo Bi, MD [facility]  If 7PM-7AM, please contact night-coverage [email or web address]  05/31/2019, 4:26 PM

## 2019-05-31 NOTE — Progress Notes (Signed)
Nutrition Follow-up  DOCUMENTATION CODES:   Severe malnutrition in context of social or environmental circumstances  INTERVENTION:  Continue Nepro Shake po TID, each supplement provides 425 kcal and 19 grams protein.  Continue Pro-Stat 30 mL po BID, each supplement provides 100 kcal and 15 grams of protein.  Continue daily MVI.  NUTRITION DIAGNOSIS:   Severe Malnutrition related to social / environmental circumstances(inadequate oral intake) as evidenced by percent weight loss, moderate fat depletion, moderate muscle depletion, severe muscle depletion.  Ongoing.  GOAL:   Patient will meet greater than or equal to 90% of their needs  Progressing.  MONITOR:   PO intake, Supplement acceptance, Labs, Weight trends, I & O's  REASON FOR ASSESSMENT:   Malnutrition Screening Tool    ASSESSMENT:   59 year old male with PMHx of schizophrenia, HTN, DM who has been in [place] for 44 days and is now admitted with generalized weakness, dysphagia, slurred speech, left facial droop to rule out CVA.  Met with patient at bedside. Patient's PO intake remains variable. He ate 80-100% of his meals from 4/14-4/15. Today he reports eating only a little bit of breakfast but according to chart it was 0%. He had not started on lunch tray at time of RD assessment. Patient reports he continues to drink Nepro TID and Pro-Stat BID.  Medications reviewed and include: Remeron 45 mg QHS, MVI daily, Miralax.  Labs reviewed.  Diet Order:   Diet Order            DIET - DYS 1 Room service appropriate? Yes with Assist; Fluid consistency: Thin  Diet effective now             EDUCATION NEEDS:   No education needs have been identified at this time  Skin:  Skin Assessment: Skin Integrity Issues:(unstageable to coccyx; DTIs to bilateral heels and right ankle)  Last BM:  05/29/2019 per chart  Height:   Ht Readings from Last 1 Encounters:  03/28/19 _0  (1.753 m)   Weight:   Wt Readings from  Last 1 Encounters:  05/25/19 83.5 kg   Ideal Body Weight:  72.7 kg  BMI:  Body mass index is 27.18 kg/m.  Estimated Nutritional Needs:   Kcal:  2000-2200  Protein:  100-110 grams  Fluid:  >/= 2 L/day  Jacklynn Barnacle, MS, RD, LDN Pager number available on Amion

## 2019-06-01 DIAGNOSIS — B952 Enterococcus as the cause of diseases classified elsewhere: Secondary | ICD-10-CM | POA: Diagnosis not present

## 2019-06-01 DIAGNOSIS — R7881 Bacteremia: Secondary | ICD-10-CM | POA: Diagnosis not present

## 2019-06-01 LAB — COMPREHENSIVE METABOLIC PANEL
ALT: 15 U/L (ref 0–44)
AST: 17 U/L (ref 15–41)
Albumin: 3.5 g/dL (ref 3.5–5.0)
Alkaline Phosphatase: 56 U/L (ref 38–126)
Anion gap: 10 (ref 5–15)
BUN: 44 mg/dL — ABNORMAL HIGH (ref 8–23)
CO2: 30 mmol/L (ref 22–32)
Calcium: 9.4 mg/dL (ref 8.9–10.3)
Chloride: 101 mmol/L (ref 98–111)
Creatinine, Ser: 0.97 mg/dL (ref 0.61–1.24)
GFR calc Af Amer: >60 mL/min (ref >60)
GFR calc non Af Amer: >60 mL/min (ref >60)
Glucose, Bld: 100 mg/dL — ABNORMAL HIGH (ref 70–99)
Potassium: 4.1 mmol/L (ref 3.5–5.1)
Sodium: 141 mmol/L (ref 135–145)
Total Bilirubin: 0.8 mg/dL (ref 0.3–1.2)
Total Protein: 7 g/dL (ref 6.5–8.1)

## 2019-06-01 LAB — CBC
HCT: 32.8 % — ABNORMAL LOW (ref 39.0–52.0)
Hemoglobin: 11.2 g/dL — ABNORMAL LOW (ref 13.0–17.0)
MCH: 29.9 pg (ref 26.0–34.0)
MCHC: 34.1 g/dL (ref 30.0–36.0)
MCV: 87.7 fL (ref 80.0–100.0)
Platelets: 151 10*3/uL (ref 150–400)
RBC: 3.74 MIL/uL — ABNORMAL LOW (ref 4.22–5.81)
RDW: 14 % (ref 11.5–15.5)
WBC: 4.8 10*3/uL (ref 4.0–10.5)
nRBC: 0 % (ref 0.0–0.2)

## 2019-06-01 LAB — MAGNESIUM: Magnesium: 1.7 mg/dL (ref 1.7–2.4)

## 2019-06-01 NOTE — TOC Progression Note (Signed)
Transition of Care [facility]) - Progression Note    Patient Details  Name: Peter Parsons MRN: [id number] Date of Birth: May 28, 1960  Transition of [facility] CM/SW Contact  Shelbie Ammons, RN Phone Number: 06/01/2019, 1:47 PM  Clinical Narrative:     Group meeting completed with Vanna Scotland from [facility], Dorene Sorrow from the ACT Team, Peter Parsons (patient's guardian), Aida Raider [facility] department supervisor and Nettie Elm [facility] department head. Discussion was had concerning patient's ongoing placement issues and whether or not his current status is suitable for a group living high placement.  Adia to work on Games developer with what does characterize a person as suitable for this living situation. Per request of Zac she is also going to work on arranging a meeting between there Market researcher and a physician representative here. This CM will notify Dr. Rockne Menghini.     Expected Discharge Plan: Group Home Barriers to Discharge: SNF Pending bed offer  Expected Discharge Plan and Services Expected Discharge Plan: Group Home   Discharge Planning Services: CM Consult   Living arrangements for the past 2 months: Group Home                 DME Arranged: N/A DME Agency: [employer] Date DME Agency Contacted: 04/12/19 Time DME Agency Contacted: [phone] Representative spoke with at DME Agency: none needed [place] Arranged: NA           Social Determinants of Health ([place]) Interventions    Readmission Risk Interventions Readmission Risk Prevention Plan 04/27/2019 04/20/2019  Transportation Screening Complete Complete  PCP or Specialist Appt within 3-5 Days - Not Complete  Not Complete comments - not ready to [place] or Home Care Consult - Not Complete  HRI or Home Care Consult comments - not ready toDC, looking for a bed for placement  Social Work Consult for [place] Planning/Counseling - [place] - Not Applicable  Medication Review (RN  Care Manager) Referral to Pharmacy Referral to Pharmacy  PCP or Specialist appointment within 3-5 days of discharge Complete -  [place] or [place] Complete -  [place] Not Applicable -

## 2019-06-01 NOTE — Progress Notes (Addendum)
Went to room and patient was trying to pull out his Coude catheter, he said he did not care if he bled out when he pulls it out. Messaged Dr. Billie Ruddy and she came to see patient. New order to take out the Coude catheter and I/O cath if >600 ml. If >2 I/O cath needed, then pls let day attending know and re-insert Foley. May need urology to place. Also when turning patient trying to get him straightened up in the bed  and change his wound dressing, his wound was draining a bright green color and had a rancid smell, made Dr. Billie Ruddy aware of same. Also removed Coude catheter as ordered.

## 2019-06-01 NOTE — Progress Notes (Signed)
PROGRESS NOTE    Peter Parsons  [id number] DOB: 24-Dec-1960 DOA: 02/11/2019 PCP: System, Provider Not In   Brief Narrative:  Peter Parsons is a 59 year old male with chronic schizophrenia with frequent suicidal ideations, bipolar disorder with depression, type 2 diabetes mellitus (controlled), CKD stage II, peripheral neuropathy, BPH, hypertension presented with altered mental status.  Prolonged hospital stay awaiting placement in a memory care unit.  Does not have capacity.  Hospital course/significant events 3/31: Prolonged hospital stay of almost 6 weeks awaiting for placement.  Foley discontinued after prolonged use in the hospital (suspected was placed due to bladder outlet obstruction).  Intermittent bladder scan showing urinary retention. 4/1: Foley placed in due to urinary retention noted on repeat bladder scans.  Renal ultrasound done showed distended bladder with Foley not in the bladder. 4/2: Patient found to have gross hematuria with clots and Foley removed.  Urology consulted and a 72 French coud catheter placed.  Patient became septic with hypotension, fever and tachycardia.  Received 5 L normal saline bolus with minimal improvement and transferred to stepdown unit.  Started on Neo-Synephrine through peripheral IV and given 1 unit PRBC for acute blood loss anemia. 4/3: Stable off pressors and transferred back to medical floor.  Subjective: Pt had a recorded temp of 100.5 early this morning.  Pt denied dyspnea, cough, dysuria, abdominal pain.  Nursing noted pressure ulcer wounds on the back draining green fluids.    Pt later was found pulling at his Foley, and said he wanted it out and that it was causing him discomfort.  Pt said he wanted to try to void on his own, and since pt may have already dislodged the Foley, it was removed, and bladder scan ordered for a voiding trial.     Assessment & Plan:   Principal Problem:   Enterococcal bacteremia Active Problems:   Paranoid  schizophrenia ([place])   Altered mental status   Dementia ([place])   Weakness   Acute metabolic encephalopathy   Protein-calorie malnutrition, severe   Pressure injury of skin   Hypertension   Type 2 diabetes mellitus (HCC)   Dyslipidemia   BPH (benign prostatic hyperplasia)   Normocytic anemia   Thrombocytopenia (HCC)   Acute urinary retention   Urethral trauma   UTI (urinary tract infection)    Pressure Injury Coccyx Mid Stage 3, not POA --Full thickness tissue loss --Pt refused to be turned, per nursing.  --Nursing noted greenish drainage today 4/20. --Mild temp of 100.5 early morning of 4/20 PLAN: --Hold abx for now and continue to monitor for fever --Wound care to assess wounds tomorrow   Pressure Injury right and left Heel Deep Tissue Pressure Injury    Severe sepsis with septic shock.  Resolved. Secondary to catheter associated UTI. Patient had multiple straight caths done past few days and Foley placed in on 4/1. Transferred to stepdown unit and multiple IV normal saline bolus received.  Required Neo-Synephrine overnight and now stable off pressors.  Sepsis resolved. Blood and urine culture growing Enterococcus faecalis. Antibiotic narrowed to ampicillin -completed course.  Constipation. -Miralax BID  Fungal rash on his face after shaving of beard.  -Try keeping that area dry. -Nystatin cream  Hypokalemia/hypomagnesemia.  Resolved -Replete electrolytes as needed and monitor.  Gross hematuria, resolved Secondary to traumatic catheterization.  Foley removed and urology placed in a 20 [place] coud Foley without resistance.  Recommend intermittent irrigation, keep Foley upon discharge and follow-up with urology in [place]. Urine now clear.  Received 1 unit PRBC  for drop in H&H.  BPH with urinary retention, now with chronic Foley Continue Foley-Foley was placed by urology after having a traumatic urethral injury and they advised to keep the Foley in until he  will follow-up with them as an outpatient in 2 weeks. -Failed voiding trail, Coude Foley re-inserted on 4/13. --Foley removed 4/20 because pt was trying to pull it out, and he wanted to try a voiding trial.  Bladder scan ordered.  Chronic schizophrenia Continue paliperidone, Klonopin, mirtazapine and risperidone.  History of frequent suicidal ideation.  Does not have capacity.  Outpatient psychiatry follow-up.  Depression with bipolar disorder Continue Lexapro and trazodone.  AKI, resolved chronic kidney disease stage II Likely ATN with sepsis + bladder outlet obstruction.    Type 2 diabetes mellitus, controlled A1c of 4.5.  CBG stable.  Thrombocytopenia, resolved Unclear etiology.  Possibly from acute illness.   Peripheral neuropathy Continue gabapentin  Hypertension/ hyperlipidemia.  BP within goal. Off all blood pressure meds. Continue statin.  Dysphagia/dysarthria and left facial droop MRI negative for CVA.  Continue dysphagia diet  Severe protein calorie malnutrition Continue nutrition supplement and Remeron  Failure to thrive PT recommends home health with 24-hour supervision.  Awaiting placement.   Objective: Vitals:   06/01/19 0854 06/01/19 1509 06/01/19 1700 06/01/19 1845  BP: 122/65 (!) 141/80    Pulse: 67 (!) 106    Resp: 16 18    Temp: 98.9 F (37.2 C) 98.7 F (37.1 C) 99 F (37.2 C) 99.3 F (37.4 C)  TempSrc:  Oral  Axillary  SpO2: 93% 98%    Weight:      Height:        Intake/Output Summary (Last 24 hours) at 06/01/2019 1845 Last data filed at 06/01/2019 1830 Gross per 24 hour  Intake 600 ml  Output 3600 ml  Net -3000 ml   Filed Weights   05/18/19 0532 05/25/19 0500 06/01/19 0500  Weight: 81.6 kg 83.5 kg 82.4 kg    Examination:  Constitutional: NAD, resting, easily arousable HEENT: conjunctivae and lids normal, EOMI, head always tilted to the right CV: RRR no M,R,G. Distal pulses +2.  No cyanosis.   RESP: More rhonchus today    GI: +BS, NTND Extremities: Mild swelling in both feet SKIN: warm, dry and intact Neuro: II - XII grossly intact.  Sensation intact Foley in.   DVT prophylaxis:.  SCDs. Code Status: Full Family Communication:  Disposition Plan: Difficult disposition.  He was evaluated by a group home and they refused to take him due to complex medical needs. Social worker continues to look for a place for him.  Consultants:   Psychiatry  Urology  Procedures:  Antimicrobials:   Data Reviewed: I have personally reviewed following labs and imaging studies  CBC: Recent Labs  Lab 05/26/19 0817 05/27/19 0612 05/28/19 0447 06/01/19 0954  WBC 6.3 5.5 5.8 4.8  HGB 9.9* 10.2* 9.9* 11.2*  HCT 28.9* 29.5* 29.9* 32.8*  MCV 88.1 87.0 90.3 87.7  PLT 160 153 158 [id number]   Basic Metabolic Panel: Recent Labs  Lab 05/26/19 0817 05/27/19 0612 05/28/19 0447 06/01/19 0954  NA 140 142 143 141  K 4.0 3.7 3.8 4.1  CL 100 102 100 101  CO2 32 31 33* 30  GLUCOSE 94 96 98 100*  BUN 38* 40* 48* 44*  CREATININE 0.93 0.94 0.96 0.97  CALCIUM 8.8* 9.3 9.2 9.4  MG 1.9 1.7 2.0 1.7   GFR: Estimated Creatinine Clearance: 80 mL/min (by C-G formula based on  SCr of 0.97 mg/dL). Liver Function Tests: Recent Labs  Lab 06/01/19 0954  AST 17  ALT 15  ALKPHOS 56  BILITOT 0.8  PROT 7.0  ALBUMIN 3.5   No results for input(s): LIPASE, AMYLASE in the last 168 hours. No results for input(s): AMMONIA in the last 168 hours. Coagulation Profile: No results for input(s): INR, PROTIME in the last 168 hours. Cardiac Enzymes: No results for input(s): CKTOTAL, CKMB, CKMBINDEX, TROPONINI in the last 168 hours. BNP (last 3 results) No results for input(s): PROBNP in the last 8760 hours. HbA1C: No results for input(s): HGBA1C in the last 72 hours. CBG: Recent Labs  Lab 05/31/19 0729  GLUCAP 91   Lipid Profile: No results for input(s): CHOL, HDL, LDLCALC, TRIG, CHOLHDL, LDLDIRECT in the last 72 hours. Thyroid  Function Tests: No results for input(s): TSH, T4TOTAL, FREET4, T3FREE, THYROIDAB in the last 72 hours. Anemia Panel: No results for input(s): VITAMINB12, FOLATE, FERRITIN, TIBC, IRON, RETICCTPCT in the last 72 hours. Sepsis Labs: No results for input(s): PROCALCITON, LATICACIDVEN in the last 168 hours.  No results found for this or any previous visit (from the past 240 hour(s)).   Radiology Studies: No results found.  Scheduled Meds: . atorvastatin  10 mg Oral Daily  . chlorhexidine  15 mL Mouth/Throat BID  . Chlorhexidine Gluconate Cloth  6 each Topical Daily  . escitalopram  10 mg Oral Daily  . feeding supplement (NEPRO CARB STEADY)  237 mL Oral TID BM  . feeding supplement (PRO-STAT SUGAR FREE 64)  30 mL Oral BID  . gabapentin  300 mg Oral TID  . mirtazapine  45 mg Oral QHS  . multivitamin with minerals  1 tablet Oral Daily  . nystatin cream   Topical BID  . polyethylene glycol  17 g Oral BID  . risperiDONE  3 mg Oral BID  . senna-docusate  2 tablet Oral BID  . sodium chloride flush  10-40 mL Intracatheter Q12H  . tamsulosin  0.4 mg Oral Daily  . traZODone  150 mg Oral QHS   Continuous Infusions: . sodium chloride 250 mL (05/23/19 0309)     LOS: 64 days    Enzo Bi, MD [facility]  If 7PM-7AM, please contact night-coverage [email or web address]  06/01/2019, 6:45 PM

## 2019-06-02 DIAGNOSIS — B952 Enterococcus as the cause of diseases classified elsewhere: Secondary | ICD-10-CM | POA: Diagnosis not present

## 2019-06-02 DIAGNOSIS — R7881 Bacteremia: Secondary | ICD-10-CM | POA: Diagnosis not present

## 2019-06-02 MED ORDER — COLLAGENASE 250 UNIT/GM EX OINT
TOPICAL_OINTMENT | Freq: Every day | CUTANEOUS | Status: DC
  Administered 2019-06-06: 1 via TOPICAL
  Filled 2019-06-02: qty 30

## 2019-06-02 NOTE — NC FL2 (Signed)
[place] LEVEL OF CARE SCREENING TOOL     IDENTIFICATION  Patient Name: Peter Parsons Birthdate: November 12, 1960 Sex: male Admission Date (Current Location): 02/11/2019  [place] and [place] Number:  [name] [id number] [place] and Address:  [facility], [address] [place], [place] [address]      Provider Number: [id number]  Attending Physician Name and Address:  Sidney Ace, MD  Relative Name and Phone Number:  [name] [phone] legal guardian    Current Level of Care: Hospital Recommended Level of Care: [place], [place] Prior Approval Number:    Date Approved/Denied:   PASRR Number: [id number]  Discharge Plan: Domiciliary (Rest home)    Current Diagnoses: Patient Active Problem List   Diagnosis Date Noted  . Enterococcal bacteremia 05/15/2019  . Hypertension 05/15/2019  . Type 2 diabetes mellitus ([place]) 05/15/2019  . Dyslipidemia 05/15/2019  . BPH (benign prostatic hyperplasia) 05/15/2019  . Normocytic anemia 05/15/2019  . Thrombocytopenia ([place]) 05/15/2019  . Acute urinary retention 05/15/2019  . Urethral trauma 05/15/2019  . UTI (urinary tract infection) 05/15/2019  . Pressure injury of skin 04/13/2019  . Acute metabolic encephalopathy [id number]  . Protein-calorie malnutrition, severe 03/29/2019  . Weakness 03/27/2019  . Dementia ([place]) 03/19/2019  . Altered mental status   . Paranoid schizophrenia ([place])     Orientation RESPIRATION BLADDER Height & Weight     Self, Time, Place, Situation  Normal Continent Weight: 82.4 kg Height:  5\' 9"  (175.3 cm)  BEHAVIORAL SYMPTOMS/MOOD NEUROLOGICAL BOWEL NUTRITION STATUS  [place]   Continent Diet(Dysphagia 1)  AMBULATORY STATUS COMMUNICATION OF NEEDS Skin   Limited Assist Verbally PU Stage and Appropriate Care     PU Stage 3 Dressing: Daily(Foam dressing)                 Personal Care Assistance Level of Assistance   Bathing, Dressing Bathing Assistance: Limited assistance   Dressing Assistance: Limited assistance     Functional Limitations Info  Speech     Speech Info: Impaired    SPECIAL CARE FACTORS FREQUENCY  PT (By licensed PT), OT (By licensed OT)     PT Frequency: 5 times per week              Contractures Contractures Info: Not present    Additional Factors Info  Code Status, Allergies Code Status Info: Full Allergies Info: Acetaminophen           Current Medications (06/02/2019):  This is the current hospital active medication list Current Facility-Administered Medications  Medication Dose Route Frequency Provider Last Rate Last Admin  . 0.9 %  sodium chloride infusion   Intravenous PRN Lorella Nimrod, MD 10 mL/hr at 05/23/19 0309 250 mL at 05/23/19 0309  . atorvastatin (LIPITOR) tablet 10 mg  10 mg Oral Daily Carrie Mew, MD   10 mg at 06/01/19 0929  . chlorhexidine (PERIDEX) 0.12 % solution 15 mL  15 mL Mouth/Throat BID Lavina Hamman, MD   15 mL at 05/31/19 2111  . Chlorhexidine Gluconate Cloth 2 % PADS 6 each  6 each Topical Daily Dhungel, Nishant, MD   6 each at 06/01/19 0931  . clonazePAM (KLONOPIN) tablet 0.25 mg  0.25 mg Oral TID PRN Dixie Dials, MD   0.25 mg at 05/01/19 1603  . dextrose 50 % solution 50 mL  1 ampule Intravenous PRN Lavina Hamman, MD      . escitalopram (LEXAPRO) tablet 10 mg  10 mg  Oral Daily Cristofano, Dorene Ar, MD   10 mg at 06/01/19 0930  . feeding supplement (NEPRO CARB STEADY) liquid 237 mL  237 mL Oral TID BM Enzo Bi, MD 0 mL/hr at 05/30/19 2130 237 mL at 06/01/19 1337  . feeding supplement (PRO-STAT SUGAR FREE 64) liquid 30 mL  30 mL Oral BID Dhungel, Nishant, MD   30 mL at 06/01/19 0928  . gabapentin (NEURONTIN) capsule 300 mg  300 mg Oral TID Carrie Mew, MD   300 mg at 06/01/19 1601  . metoprolol tartrate (LOPRESSOR) injection 5 mg  5 mg Intravenous Q12H PRN Wyvonnia Dusky, MD   5 mg at 05/14/19 1011  .  mirtazapine (REMERON) tablet 45 mg  45 mg Oral QHS Clapacs, Madie Reno, MD   45 mg at 05/31/19 2112  . multivitamin with minerals tablet 1 tablet  1 tablet Oral Daily Cristofano, Dorene Ar, MD   1 tablet at 06/01/19 0930  . nystatin cream (MYCOSTATIN)   Topical BID Lorella Nimrod, MD   Given at 06/01/19 [phone]  . polyethylene glycol (MIRALAX / GLYCOLAX) packet 17 g  17 g Oral BID Enzo Bi, MD   17 g at 06/01/19 0927  . risperiDONE (RISPERDAL) tablet 3 mg  3 mg Oral BID Carrie Mew, MD   3 mg at 06/01/19 0930  . senna-docusate (Senokot-S) tablet 2 tablet  2 tablet Oral BID Lorella Nimrod, MD   2 tablet at 06/01/19 0930  . sodium chloride flush (NS) 0.9 % injection 10-40 mL  10-40 mL Intracatheter Q12H Dhungel, Nishant, MD   10 mL at 06/01/19 0931  . sodium chloride flush (NS) 0.9 % injection 10-40 mL  10-40 mL Intracatheter PRN Dhungel, Nishant, MD      . tamsulosin (FLOMAX) capsule 0.4 mg  0.4 mg Oral Daily Fritzi Mandes, MD   0.4 mg at 06/01/19 0929  . traZODone (DESYREL) tablet 150 mg  150 mg Oral QHS Carrie Mew, MD   150 mg at 05/31/19 2111     Discharge Medications: Please see discharge summary for a list of discharge medications.  Relevant Imaging Results:  Relevant Lab Results:   Additional Information SSN: [id number]  Shelbie Ammons, RN

## 2019-06-02 NOTE — Progress Notes (Signed)
PROGRESS NOTE    Peter Parsons  [id number] DOB: August 16, 1960 DOA: 02/11/2019 PCP: System, Provider Not In   Brief Narrative:  Peter Parsons is a 59 year old male with chronic schizophrenia with frequent suicidal ideations, bipolar disorder with depression, type 2 diabetes mellitus (controlled), CKD stage II, peripheral neuropathy, BPH, hypertension presented with altered mental status. Prolonged hospital stay awaiting placement in a memory care unit. Does not have capacity.  Hospital course/significant events 3/31:Prolonged hospital stay of almost 6 weeks awaiting for placement. Foley discontinued after prolonged use in the hospital (suspected was placed due to bladder outlet obstruction). Intermittent bladder scan showing urinary retention. 4/1:Foley placed in due to urinary retention noted on repeat bladder scans. Renal ultrasound done showed distended bladder with Foley not in the bladder. 4/2:Patient found to have gross hematuria with clots and Foley removed. Urology consulted and a 35 French coud catheter placed. Patient became septic with hypotension, fever and tachycardia. Received 5 L normal saline bolus with minimal improvement and transferred to stepdown unit. Started on Neo-Synephrine through peripheral IV and given 1 unit PRBC for acute blood loss anemia. 4/3: Stable off pressors and transferred back to medical floor.  4/21: Care assumed by this provider.  Patient has been ambulating in the unit at time of my evaluation this morning.  Slightly antalgic gait however is able to ambulate with a walker around the unit.  Sacral wound evaluated by wound care.  Dressing recommendations appreciated.  No fevers noted over interval.    Assessment & Plan:   Principal Problem:   Enterococcal bacteremia Active Problems:   Paranoid schizophrenia ([place])   Altered mental status   Dementia (HCC)   Weakness   Acute metabolic encephalopathy   Protein-calorie malnutrition,  severe   Pressure injury of skin   Hypertension   Type 2 diabetes mellitus (HCC)   Dyslipidemia   BPH (benign prostatic hyperplasia)   Normocytic anemia   Thrombocytopenia (H[facility])   Acute urinary retention   Urethral trauma   UTI (urinary tract infection)  Pressure Injury Coccyx Mid Stage 3, not POA --Full thickness tissue loss --Pt refused to be turned, per nursing.  --Nursing noted greenish drainage 4/20. --Mild temp of 100.5 early morning of 4/20 PLAN: Wound care consulted, recommendations appreciated Wound dressed, continue to stress turns and ambulation Hold off on antibiotics for now If patient spikes another temperature, will repeat blood cultures and initiate empiric antibiotic coverage  Pressure Injury right and left Heel Deep Tissue Pressure Injury  Unstageable, addressed by wound care  Severe sepsis with septic shock.  Resolved. Secondary to catheter associated UTI. Patient had multiple straight caths done past few days and Foley placed in on 4/1. Transferred to stepdown unit and multiple IV normal saline bolus received. Required Neo-Synephrine overnight and now stable off pressors. Sepsis resolved. Blood and urine culture growing Enterococcus faecalis. Antibiotic narrowed to ampicillin -completed course.  BPH with urinary retention, now with chronic Foley Continue Foley-Foley was placed by urology after having a traumatic urethral injury and they advised to keep the Foley in until he will follow-up with them as an outpatient in 2 weeks. -Failed voiding trail, Coude Foley re-inserted on 4/13. --Foley removed 4/20 because pt was trying to pull it out, and he wanted to try a voiding trial.  Bladder scan ordered. -4/22: Patient continues to retain.  Will attempt to place another Foley.  Patient may not need coud catheter.  Nursing to attempt.  Constipation. -Miralax BID  Fungal rash on his face after shaving of  beard.  -Try keeping that area dry. -Nystatin  cream  Hypokalemia/hypomagnesemia.  Resolved -Replete electrolytes as needed and monitor.  Gross hematuria, resolved Secondary to traumatic catheterization.Foley removed and urology placed in a 20 [place] coud Foley without resistance. Recommend intermittent irrigation, keep Foley upon discharge and follow-up with urology in [place]. Urine now clear. Received 1 unit PRBC for drop in H&H.  Chronic schizophrenia Continue paliperidone, Klonopin, mirtazapine and risperidone. History of frequent suicidal ideation. Does not have capacity. Outpatient psychiatry follow-up.  Depression with bipolar disorder Continue Lexapro and trazodone.  AKI, resolved chronic kidney disease stage II Likely ATN with sepsis + bladder outlet obstruction.   Type 2 diabetes mellitus, controlled A1c of 4.5. CBG stable.  Thrombocytopenia, resolved Unclear etiology. Possibly from acute illness.   Peripheral neuropathy Continue gabapentin  Hypertension/ hyperlipidemia.  BP within goal. Off all blood pressure meds. Continue statin.  Dysphagia/dysarthria and left facial droop MRI negative for CVA. Continue dysphagia diet  Severe protein calorie malnutrition Continue nutrition supplement and Remeron  Failure to thrive PT recommends home health with 24-hour supervision. Awaiting placement.   DVT prophylaxis: SCDs Code Status: Full Family Communication: None today  disposition Plan:  Status is: Inpatient  Remains inpatient appropriate because:Unsafe d/c plan   Dispo: The patient is from: Home              Anticipated d/c is to: Group home              Anticipated d/c date is: > 3 days              Patient currently is not medically stable to d/c.        Consultants:   Psych  urology  Procedures:   Foley catheterization  Antimicrobials:   none   Subjective: Seen and examined Ambulating in hallway with physical therapy  Objective: Vitals:   06/01/19  1509 06/01/19 1700 06/01/19 1845 06/01/19 2352  BP: (!) 141/80   126/61  Pulse: (!) 106   66  Resp: 18   20  Temp: 98.7 F (37.1 C) 99 F (37.2 C) 99.3 F (37.4 C) 98 F (36.7 C)  TempSrc: Oral  Axillary Axillary  SpO2: 98%   98%  Weight:      Height:        Intake/Output Summary (Last 24 hours) at 06/02/2019 1319 Last data filed at 06/02/2019 0500 Gross per 24 hour  Intake 480 ml  Output 2550 ml  Net -2070 ml   Filed Weights   05/18/19 0532 05/25/19 0500 06/01/19 0500  Weight: 81.6 kg 83.5 kg 82.4 kg    Examination:  Constitutional: NAD, resting, easily arousable HEENT: conjunctivae and lids normal, EOMI, head always tilted to the right CV: RRR no M,R,G. Distal pulses +2.  No cyanosis.   RESP: Clear to auscultation, normal WOB GI: +BS, NTND Extremities: Mild swelling in both feet SKIN: warm, dry and intact Neuro: II - XII grossly intact.  Sensation intact    Data Reviewed: I have personally reviewed following labs and imaging studies  CBC: Recent Labs  Lab 05/27/19 0612 05/28/19 0447 06/01/19 0954  WBC 5.5 5.8 4.8  HGB 10.2* 9.9* 11.2*  HCT 29.5* 29.9* 32.8*  MCV 87.0 90.3 87.7  PLT 153 158 [id number]   Basic Metabolic Panel: Recent Labs  Lab 05/27/19 0612 05/28/19 0447 06/01/19 0954  NA 142 143 141  K 3.7 3.8 4.1  CL 102 100 101  CO2 31 33* 30  GLUCOSE 96 98 100*  BUN 40* 48* 44*  CREATININE 0.94 0.96 0.97  CALCIUM 9.3 9.2 9.4  MG 1.7 2.0 1.7   GFR: Estimated Creatinine Clearance: 80 mL/min (by C-G formula based on SCr of 0.97 mg/dL). Liver Function Tests: Recent Labs  Lab 06/01/19 0954  AST 17  ALT 15  ALKPHOS 56  BILITOT 0.8  PROT 7.0  ALBUMIN 3.5   No results for input(s): LIPASE, AMYLASE in the last 168 hours. No results for input(s): AMMONIA in the last 168 hours. Coagulation Profile: No results for input(s): INR, PROTIME in the last 168 hours. Cardiac Enzymes: No results for input(s): CKTOTAL, CKMB, CKMBINDEX, TROPONINI in the  last 168 hours. BNP (last 3 results) No results for input(s): PROBNP in the last 8760 hours. HbA1C: No results for input(s): HGBA1C in the last 72 hours. CBG: Recent Labs  Lab 05/31/19 0729  GLUCAP 91   Lipid Profile: No results for input(s): CHOL, HDL, LDLCALC, TRIG, CHOLHDL, LDLDIRECT in the last 72 hours. Thyroid Function Tests: No results for input(s): TSH, T4TOTAL, FREET4, T3FREE, THYROIDAB in the last 72 hours. Anemia Panel: No results for input(s): VITAMINB12, FOLATE, FERRITIN, TIBC, IRON, RETICCTPCT in the last 72 hours. Sepsis Labs: No results for input(s): PROCALCITON, LATICACIDVEN in the last 168 hours.  No results found for this or any previous visit (from the past 240 hour(s)).       Radiology Studies: No results found.      Scheduled Meds: . atorvastatin  10 mg Oral Daily  . chlorhexidine  15 mL Mouth/Throat BID  . Chlorhexidine Gluconate Cloth  6 each Topical Daily  . collagenase   Topical Daily  . escitalopram  10 mg Oral Daily  . feeding supplement (NEPRO CARB STEADY)  237 mL Oral TID BM  . feeding supplement (PRO-STAT SUGAR FREE 64)  30 mL Oral BID  . gabapentin  300 mg Oral TID  . mirtazapine  45 mg Oral QHS  . multivitamin with minerals  1 tablet Oral Daily  . nystatin cream   Topical BID  . polyethylene glycol  17 g Oral BID  . risperiDONE  3 mg Oral BID  . senna-docusate  2 tablet Oral BID  . sodium chloride flush  10-40 mL Intracatheter Q12H  . tamsulosin  0.4 mg Oral Daily  . traZODone  150 mg Oral QHS   Continuous Infusions: . sodium chloride 250 mL (05/23/19 0309)     LOS: 65 days    Time spent: 35 min    Sidney Ace, MD [facility] Pager [phone]  If 7PM-7AM, please contact night-coverage 06/02/2019, 1:19 PM

## 2019-06-02 NOTE — TOC Progression Note (Signed)
Transition of Care [facility]) - Progression Note    Patient Details  Name: Peter Parsons MRN: [id number] Date of Birth: [birth year]  Transition [facility]) CM/SW Contact  Shelbie Ammons, RN Phone Number: 06/02/2019, 3:47 PM  Clinical Narrative:   RNCM sent FL-2 and updated progress notes to Lavada Mesi at [facility] for her review.     Expected Discharge Plan: Group Home Barriers to Discharge: SNF Pending bed offer  Expected Discharge Plan and Services Expected Discharge Plan: Group Home   Discharge Planning Services: CM Consult   Living arrangements for the past 2 months: Group Home                 DME Arranged: N/A DME Agency: [employer] Date DME Agency Contacted: 04/12/19 Time DME Agency Contacted: [phone] Representative spoke with at DME Agency: none needed [place] Arranged: NA           Social Determinants of Health ([place]) Interventions    Readmission Risk Interventions Readmission Risk Prevention Plan 04/27/2019 04/20/2019  Transportation Screening Complete Complete  PCP or Specialist Appt within 3-5 Days - Not Complete  Not Complete comments - not ready to [place] or Home Care Consult - Not Complete  HRI or Home Care Consult comments - not ready toDC, looking for a bed for placement  Social Work Consult for [place] Planning/Counseling - [place] - Not Applicable  Medication Review (RN Care Manager) Referral to Pharmacy Referral to Pharmacy  PCP or Specialist appointment within 3-5 days of discharge Complete -  [place] or [place] Complete -  [place] Not Applicable -

## 2019-06-02 NOTE — TOC Progression Note (Signed)
Transition of Care [facility]) - Progression Note    Patient Details  Name: Berthel Kapaun MRN: [id number] Date of Birth: 05-Mar-1960  Transition of Care [facility]) CM/SW Contact  Shelbie Ammons, RN Phone Number: 06/02/2019, 8:05 AM  Clinical Narrative:   RNCM sent secure email with updated FL-2 to patient's guardian [name] per her request.     Expected Discharge Plan: Group Home Barriers to Discharge: SNF Pending bed offer  Expected Discharge Plan and Services Expected Discharge Plan: Group Home   Discharge Planning Services: CM Consult   Living arrangements for the past 2 months: Group Home                 DME Arranged: N/A DME Agency: [employer] Date DME Agency Contacted: 04/12/19 Time DME Agency Contacted: [phone] Representative spoke with at DME Agency: none needed [place] Arranged: NA           Social Determinants of Health ([place]) Interventions    Readmission Risk Interventions Readmission Risk Prevention Plan 04/27/2019 04/20/2019  Transportation Screening Complete Complete  PCP or Specialist Appt within 3-5 Days - Not Complete  Not Complete comments - not ready to [place] or Home Care Consult - Not Complete  HRI or Home Care Consult comments - not ready toDC, looking for a bed for placement  Social Work Consult for [place] Planning/Counseling - [place] - Not Applicable  Medication Review (RN Care Manager) Referral to Pharmacy Referral to Pharmacy  PCP or Specialist appointment within 3-5 days of discharge Complete -  [place] or [place] Complete -  [place] Not Applicable -

## 2019-06-02 NOTE — Consult Note (Signed)
[place] Nurse wound follow up Wound type:Unstageable pressure injury to sacrum.  Purulence noted Measurement: 3 cm x 3 cm 100% slough to wound bed Unstageable pressure injury to left heel. Intact maroon discoloration to left heel Wound bed:100% fibrin Drainage (amount, consistency, odor) minimal purulence from sacral wound musty odor Left heel is intact and dry Periwound:dry skin Dressing procedure/placement/frequency: Cleanse sacral wound with NS and pat dry Apply santyl to wound bed.  Cover with NS moist gauze  Secure with silicone foam.  Change daily Offload pressure to left heel with prevalon boot.  Will not follow at this time.  Please re-consult if needed.  Domenic Moras MSN, RN, FNP-BC CWON Wound, Ostomy, Continence Nurse Pager [phone]

## 2019-06-03 DIAGNOSIS — R7881 Bacteremia: Secondary | ICD-10-CM | POA: Diagnosis not present

## 2019-06-03 DIAGNOSIS — B952 Enterococcus as the cause of diseases classified elsewhere: Secondary | ICD-10-CM | POA: Diagnosis not present

## 2019-06-03 NOTE — Progress Notes (Signed)
PROGRESS NOTE    Peter Parsons  [id number] DOB: 1960-04-10 DOA: 02/11/2019 PCP: System, Provider Not In   Brief Narrative:  Peter Parsons is a 59 year old male with chronic schizophrenia with frequent suicidal ideations, bipolar disorder with depression, type 2 diabetes mellitus (controlled), CKD stage II, peripheral neuropathy, BPH, hypertension presented with altered mental status. Prolonged hospital stay awaiting placement in a memory care unit. Does not have capacity.  Hospital course/significant events 3/31:Prolonged hospital stay of almost 6 weeks awaiting for placement. Foley discontinued after prolonged use in the hospital (suspected was placed due to bladder outlet obstruction). Intermittent bladder scan showing urinary retention. 4/1:Foley placed in due to urinary retention noted on repeat bladder scans. Renal ultrasound done showed distended bladder with Foley not in the bladder. 4/2:Patient found to have gross hematuria with clots and Foley removed. Urology consulted and a 72 French coud catheter placed. Patient became septic with hypotension, fever and tachycardia. Received 5 L normal saline bolus with minimal improvement and transferred to stepdown unit. Started on Neo-Synephrine through peripheral IV and given 1 unit PRBC for acute blood loss anemia. 4/3: Stable off pressors and transferred back to medical floor.  4/21: Care assumed by this provider.  Patient has been ambulating in the unit at time of my evaluation this morning.  Slightly antalgic gait however is able to ambulate with a walker around the unit.  Sacral wound evaluated by wound care.  Dressing recommendations appreciated.  No fevers noted over interval.  Continues as inpatient due to lack of safe discharge plan    Assessment & Plan:   Principal Problem:   Enterococcal bacteremia Active Problems:   Paranoid schizophrenia ([place])   Altered mental status   Dementia ([place])   Weakness   Acute  metabolic encephalopathy   Protein-calorie malnutrition, severe   Pressure injury of skin   Hypertension   Type 2 diabetes mellitus (HCC)   Dyslipidemia   BPH (benign prostatic hyperplasia)   Normocytic anemia   Thrombocytopenia (HCC)   Acute urinary retention   Urethral trauma   UTI (urinary tract infection)  Pressure Injury Coccyx Mid Stage 3, not POA --Full thickness tissue loss --Pt refused to be turned, per nursing.  --Nursing noted greenish drainage 4/20. --Mild temp of 100.5 early morning of 4/20 PLAN: Wound care consulted, recommendations appreciated Wound dressed, continue to stress turns and ambulation Hold off on antibiotics for now If patient spikes another temperature, will repeat blood cultures and initiate empiric antibiotic coverage  Pressure Injury right and left Heel Deep Tissue Pressure Injury  Unstageable, addressed by wound care  Severe sepsis with septic shock.  Resolved. Secondary to catheter associated UTI. Patient had multiple straight caths done past few days and Foley placed in on 4/1. Transferred to stepdown unit and multiple IV normal saline bolus received. Required Neo-Synephrine overnight and now stable off pressors. Sepsis resolved. Blood and urine culture growing Enterococcus faecalis. Antibiotic narrowed to ampicillin -completed course.  BPH with urinary retention, now with chronic Foley Continue Foley-Foley was placed by urology after having a traumatic urethral injury and they advised to keep the Foley in until he will follow-up with them as an outpatient in 2 weeks. -Failed voiding trail, Coude Foley re-inserted on 4/13. --Foley removed 4/20 because pt was trying to pull it out, and he wanted to try a voiding trial.  Bladder scan ordered. -4/21: Patient continues to retain.  Will attempt to place another Foley.  Patient may not need coud catheter.  Nursing to attempt. -4/22: Patient  was able to void on his own.  No indication to replace  catheter at this time  Constipation. -Miralax BID  Fungal rash on his face after shaving of beard.  -Try keeping that area dry. -Nystatin cream  Hypokalemia/hypomagnesemia.  Resolved -Replete electrolytes as needed and monitor.  Gross hematuria, resolved Secondary to traumatic catheterization.Foley removed and urology placed in a 20 [place] coud Foley without resistance. Recommend intermittent irrigation, keep Foley upon discharge and follow-up with urology in [place]. Urine now clear. Received 1 unit PRBC for drop in H&H.  Chronic schizophrenia Continue paliperidone, Klonopin, mirtazapine and risperidone. History of frequent suicidal ideation. Does not have capacity. Outpatient psychiatry follow-up.  Depression with bipolar disorder Continue Lexapro and trazodone.  AKI, resolved chronic kidney disease stage II Likely ATN with sepsis + bladder outlet obstruction.   Type 2 diabetes mellitus, controlled A1c of 4.5. CBG stable.  Thrombocytopenia, resolved Unclear etiology. Possibly from acute illness.   Peripheral neuropathy Continue gabapentin  Hypertension/ hyperlipidemia.  BP within goal. Off all blood pressure meds. Continue statin.  Dysphagia/dysarthria and left facial droop MRI negative for CVA. Continue dysphagia diet  Severe protein calorie malnutrition Continue nutrition supplement and Remeron  Failure to thrive PT recommends home health with 24-hour supervision. Awaiting placement.   DVT prophylaxis: SCDs Code Status: Full Family Communication: None today  disposition Plan:  Status is: Inpatient  Remains inpatient appropriate because:Unsafe d/c plan   Dispo: The patient is from: Home              Anticipated d/c is to: Group home              Anticipated d/c date is: > 3 days              Patient currently is not medically stable to d/c.        Consultants:   Psych  urology  Procedures:   Foley  catheterization  Antimicrobials:   none   Subjective: Seen and examined Sleeping, easily awakened  Objective: Vitals:   06/02/19 1631 06/02/19 1823 06/03/19 0003 06/03/19 0905  BP: (!) 143/82  (!) 115/58 116/68  Pulse: (!) 113 98 76 60  Resp:   16 16  Temp: 98.3 F (36.8 C)  98.4 F (36.9 C) 98.5 F (36.9 C)  TempSrc: Oral  Oral Axillary  SpO2: 97%  96% 95%  Weight:      Height:        Intake/Output Summary (Last 24 hours) at 06/03/2019 1140 Last data filed at 06/02/2019 2002 Gross per 24 hour  Intake 60 ml  Output 1300 ml  Net -1240 ml   Filed Weights   05/18/19 0532 05/25/19 0500 06/01/19 0500  Weight: 81.6 kg 83.5 kg 82.4 kg    Examination:  Constitutional: NAD, resting, easily arousable HEENT: conjunctivae and lids normal, EOMI, head always tilted to the right CV: RRR no M,R,G. Distal pulses +2.  No cyanosis.   RESP: Clear to auscultation, normal WOB GI: +BS, NTND Extremities: Mild swelling in both feet SKIN: warm, dry and intact Neuro: II - XII grossly intact.  Sensation intact    Data Reviewed: I have personally reviewed following labs and imaging studies  CBC: Recent Labs  Lab 05/28/19 0447 06/01/19 0954  WBC 5.8 4.8  HGB 9.9* 11.2*  HCT 29.9* 32.8*  MCV 90.3 87.7  PLT 158 [id number]   Basic Metabolic Panel: Recent Labs  Lab 05/28/19 0447 06/01/19 0954  NA 143 141  K 3.8 4.1  CL  100 101  CO2 33* 30  GLUCOSE 98 100*  BUN 48* 44*  CREATININE 0.96 0.97  CALCIUM 9.2 9.4  MG 2.0 1.7   GFR: Estimated Creatinine Clearance: 80 mL/min (by C-G formula based on SCr of 0.97 mg/dL). Liver Function Tests: Recent Labs  Lab 06/01/19 0954  AST 17  ALT 15  ALKPHOS 56  BILITOT 0.8  PROT 7.0  ALBUMIN 3.5   No results for input(s): LIPASE, AMYLASE in the last 168 hours. No results for input(s): AMMONIA in the last 168 hours. Coagulation Profile: No results for input(s): INR, PROTIME in the last 168 hours. Cardiac Enzymes: No results for  input(s): CKTOTAL, CKMB, CKMBINDEX, TROPONINI in the last 168 hours. BNP (last 3 results) No results for input(s): PROBNP in the last 8760 hours. HbA1C: No results for input(s): HGBA1C in the last 72 hours. CBG: Recent Labs  Lab 05/31/19 0729  GLUCAP 91   Lipid Profile: No results for input(s): CHOL, HDL, LDLCALC, TRIG, CHOLHDL, LDLDIRECT in the last 72 hours. Thyroid Function Tests: No results for input(s): TSH, T4TOTAL, FREET4, T3FREE, THYROIDAB in the last 72 hours. Anemia Panel: No results for input(s): VITAMINB12, FOLATE, FERRITIN, TIBC, IRON, RETICCTPCT in the last 72 hours. Sepsis Labs: No results for input(s): PROCALCITON, LATICACIDVEN in the last 168 hours.  No results found for this or any previous visit (from the past 240 hour(s)).       Radiology Studies: No results found.      Scheduled Meds: . atorvastatin  10 mg Oral Daily  . chlorhexidine  15 mL Mouth/Throat BID  . Chlorhexidine Gluconate Cloth  6 each Topical Daily  . collagenase   Topical Daily  . escitalopram  10 mg Oral Daily  . feeding supplement (NEPRO CARB STEADY)  237 mL Oral TID BM  . feeding supplement (PRO-STAT SUGAR FREE 64)  30 mL Oral BID  . gabapentin  300 mg Oral TID  . mirtazapine  45 mg Oral QHS  . multivitamin with minerals  1 tablet Oral Daily  . nystatin cream   Topical BID  . polyethylene glycol  17 g Oral BID  . risperiDONE  3 mg Oral BID  . senna-docusate  2 tablet Oral BID  . sodium chloride flush  10-40 mL Intracatheter Q12H  . tamsulosin  0.4 mg Oral Daily  . traZODone  150 mg Oral QHS   Continuous Infusions: . sodium chloride 250 mL (05/23/19 0309)     LOS: 66 days    Time spent: 35 min    Sidney Ace, MD [facility] Pager [phone]  If 7PM-7AM, please contact night-coverage 06/03/2019, 11:40 AM
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
## 2019-06-04 DIAGNOSIS — R7881 Bacteremia: Secondary | ICD-10-CM | POA: Diagnosis not present

## 2019-06-04 DIAGNOSIS — B952 Enterococcus as the cause of diseases classified elsewhere: Secondary | ICD-10-CM | POA: Diagnosis not present

## 2019-06-04 NOTE — Progress Notes (Signed)
PROGRESS NOTE    Domenic Distasio  [id number] DOB: Dec 07, 1960 DOA: 02/11/2019 PCP: System, Provider Not In   Brief Narrative:  Peter Parsons is a 59 year old male with chronic schizophrenia with frequent suicidal ideations, bipolar disorder with depression, type 2 diabetes mellitus (controlled), CKD stage II, peripheral neuropathy, BPH, hypertension presented with altered mental status. Prolonged hospital stay awaiting placement in a memory care unit. Does not have capacity.  Hospital course/significant events 3/31:Prolonged hospital stay of almost 6 weeks awaiting for placement. Foley discontinued after prolonged use in the hospital (suspected was placed due to bladder outlet obstruction). Intermittent bladder scan showing urinary retention. 4/1:Foley placed in due to urinary retention noted on repeat bladder scans. Renal ultrasound done showed distended bladder with Foley not in the bladder. 4/2:Patient found to have gross hematuria with clots and Foley removed. Urology consulted and a 67 French coud catheter placed. Patient became septic with hypotension, fever and tachycardia. Received 5 L normal saline bolus with minimal improvement and transferred to stepdown unit. Started on Neo-Synephrine through peripheral IV and given 1 unit PRBC for acute blood loss anemia. 4/3: Stable off pressors and transferred back to medical floor.  4/21: Care assumed by this provider.  Patient has been ambulating in the unit at time of my evaluation this morning.  Slightly antalgic gait however is able to ambulate with a walker around the unit.  Sacral wound evaluated by wound care.  Dressing recommendations appreciated.  No fevers noted over interval.  Continues as inpatient due to lack of safe discharge plan.    4/23: no status changes over interval    Assessment & Plan:   Principal Problem:   Enterococcal bacteremia Active Problems:   Paranoid schizophrenia ([place])   Altered mental  status   Dementia ([place])   Weakness   Acute metabolic encephalopathy   Protein-calorie malnutrition, severe   Pressure injury of skin   Hypertension   Type 2 diabetes mellitus (HCC)   Dyslipidemia   BPH (benign prostatic hyperplasia)   Normocytic anemia   Thrombocytopenia (HCC)   Acute urinary retention   Urethral trauma   UTI (urinary tract infection)  Pressure Injury Coccyx Mid Stage 3, not POA --Full thickness tissue loss --Pt refused to be turned, per nursing.  --Nursing noted greenish drainage 4/20. --Mild temp of 100.5 early morning of 4/20 PLAN: Wound care consulted, recommendations appreciated Wound dressed, continue to stress turns and ambulation Hold off on antibiotics for now If patient spikes another temperature, will repeat blood cultures and initiate empiric antibiotic coverage  Pressure Injury right and left Heel Deep Tissue Pressure Injury  Unstageable, addressed by wound care  Severe sepsis with septic shock.  Resolved. Secondary to catheter associated UTI. Patient had multiple straight caths done past few days and Foley placed in on 4/1. Transferred to stepdown unit and multiple IV normal saline bolus received. Required Neo-Synephrine overnight and now stable off pressors. Sepsis resolved. Blood and urine culture growing Enterococcus faecalis. Antibiotic narrowed to ampicillin -completed course.  BPH with urinary retention, now with chronic Foley Continue Foley-Foley was placed by urology after having a traumatic urethral injury and they advised to keep the Foley in until he will follow-up with them as an outpatient in 2 weeks. -Failed voiding trail, Coude Foley re-inserted on 4/13. --Foley removed 4/20 because pt was trying to pull it out, and he wanted to try a voiding trial.  Bladder scan ordered. -4/21: Patient continues to retain.  Will attempt to place another Foley.  Patient may not  need coud catheter.  Nursing to attempt. -4/22: Patient was able  to void on his own.  No indication to replace catheter at this time  Constipation. -Miralax BID  Fungal rash on his face after shaving of beard.  -Try keeping that area dry. -Nystatin cream  Hypokalemia/hypomagnesemia.  Resolved -Replete electrolytes as needed and monitor.  Gross hematuria, resolved Secondary to traumatic catheterization.Foley removed and urology placed in a 20 [place] coud Foley without resistance. Recommend intermittent irrigation, keep Foley upon discharge and follow-up with urology in [place]. Urine now clear. Received 1 unit PRBC for drop in H&H.  Chronic schizophrenia Continue paliperidone, Klonopin, mirtazapine and risperidone. History of frequent suicidal ideation. Does not have capacity. Outpatient psychiatry follow-up.  Depression with bipolar disorder Continue Lexapro and trazodone.  AKI, resolved chronic kidney disease stage II Likely ATN with sepsis + bladder outlet obstruction.   Type 2 diabetes mellitus, controlled A1c of 4.5. CBG stable.  Thrombocytopenia, resolved Unclear etiology. Possibly from acute illness.   Peripheral neuropathy Continue gabapentin  Hypertension/ hyperlipidemia.  BP within goal. Off all blood pressure meds. Continue statin.  Dysphagia/dysarthria and left facial droop MRI negative for CVA. Continue dysphagia diet  Severe protein calorie malnutrition Continue nutrition supplement and Remeron  Failure to thrive PT recommends home health with 24-hour supervision. Awaiting placement.   DVT prophylaxis: SCDs Code Status: Full Family Communication: None today  disposition Plan:  Status is: Inpatient  Remains inpatient appropriate because:Unsafe d/c plan   Dispo: The patient is from: Home              Anticipated d/c is to: Group home              Anticipated d/c date is: > 3 days              Patient currently is not medically stable to d/c.  Lack of safe discharge plan.  TOC  team aware and looking for placement      Consultants:   Psych  urology  Procedures:   Foley catheterization  Antimicrobials:   none   Subjective: Seen and examined Sleeping, easily awakened  Objective: Vitals:   06/03/19 0905 06/03/19 1525 06/04/19 0019 06/04/19 0802  BP: 116/68 114/73 114/72 117/65  Pulse: 60 80 93 (!) 57  Resp: 16 17 20 16   Temp: 98.5 F (36.9 C) 98.6 F (37 C) 97.8 F (36.6 C) 98.2 F (36.8 C)  TempSrc: Axillary Oral  Axillary  SpO2: 95% 95% 97% 96%  Weight:      Height:        Intake/Output Summary (Last 24 hours) at 06/04/2019 1058 Last data filed at 06/04/2019 1048 Gross per 24 hour  Intake 480 ml  Output 900 ml  Net -420 ml   Filed Weights   05/18/19 0532 05/25/19 0500 06/01/19 0500  Weight: 81.6 kg 83.5 kg 82.4 kg    Examination:  Constitutional: NAD, resting, easily arousable HEENT: conjunctivae and lids normal, EOMI, head always tilted to the right CV: RRR no M,R,G. Distal pulses +2.  No cyanosis.   RESP: Clear to auscultation, normal WOB GI: +BS, NTND Extremities: Mild swelling in both feet SKIN: warm, dry and intact Neuro: II - XII grossly intact.  Sensation intact Psych:  Flattened affect    Data Reviewed: I have personally reviewed following labs and imaging studies  CBC: Recent Labs  Lab 06/01/19 0954  WBC 4.8  HGB 11.2*  HCT 32.8*  MCV 87.7  PLT 123XX123   Basic Metabolic  Panel: Recent Labs  Lab 06/01/19 0954  NA 141  K 4.1  CL 101  CO2 30  GLUCOSE 100*  BUN 44*  CREATININE 0.97  CALCIUM 9.4  MG 1.7   GFR: Estimated Creatinine Clearance: 80 mL/min (by C-G formula based on SCr of 0.97 mg/dL). Liver Function Tests: Recent Labs  Lab 06/01/19 0954  AST 17  ALT 15  ALKPHOS 56  BILITOT 0.8  PROT 7.0  ALBUMIN 3.5   No results for input(s): LIPASE, AMYLASE in the last 168 hours. No results for input(s): AMMONIA in the last 168 hours. Coagulation Profile: No results for input(s): INR,  PROTIME in the last 168 hours. Cardiac Enzymes: No results for input(s): CKTOTAL, CKMB, CKMBINDEX, TROPONINI in the last 168 hours. BNP (last 3 results) No results for input(s): PROBNP in the last 8760 hours. HbA1C: No results for input(s): HGBA1C in the last 72 hours. CBG: Recent Labs  Lab 05/31/19 0729  GLUCAP 91   Lipid Profile: No results for input(s): CHOL, HDL, LDLCALC, TRIG, CHOLHDL, LDLDIRECT in the last 72 hours. Thyroid Function Tests: No results for input(s): TSH, T4TOTAL, FREET4, T3FREE, THYROIDAB in the last 72 hours. Anemia Panel: No results for input(s): VITAMINB12, FOLATE, FERRITIN, TIBC, IRON, RETICCTPCT in the last 72 hours. Sepsis Labs: No results for input(s): PROCALCITON, LATICACIDVEN in the last 168 hours.  No results found for this or any previous visit (from the past 240 hour(s)).       Radiology Studies: No results found.      Scheduled Meds: . atorvastatin  10 mg Oral Daily  . chlorhexidine  15 mL Mouth/Throat BID  . Chlorhexidine Gluconate Cloth  6 each Topical Daily  . collagenase   Topical Daily  . escitalopram  10 mg Oral Daily  . feeding supplement (NEPRO CARB STEADY)  237 mL Oral TID BM  . feeding supplement (PRO-STAT SUGAR FREE 64)  30 mL Oral BID  . gabapentin  300 mg Oral TID  . mirtazapine  45 mg Oral QHS  . multivitamin with minerals  1 tablet Oral Daily  . nystatin cream   Topical BID  . polyethylene glycol  17 g Oral BID  . risperiDONE  3 mg Oral BID  . senna-docusate  2 tablet Oral BID  . sodium chloride flush  10-40 mL Intracatheter Q12H  . tamsulosin  0.4 mg Oral Daily  . traZODone  150 mg Oral QHS   Continuous Infusions: . sodium chloride 250 mL (05/23/19 0309)     LOS: 67 days    Time spent: 35 min    Sidney Ace, MD [facility] Pager [phone]  If 7PM-7AM, please contact night-coverage 06/04/2019, 10:58 AM

## 2019-06-05 DIAGNOSIS — B952 Enterococcus as the cause of diseases classified elsewhere: Secondary | ICD-10-CM | POA: Diagnosis not present

## 2019-06-05 DIAGNOSIS — R7881 Bacteremia: Secondary | ICD-10-CM | POA: Diagnosis not present

## 2019-06-05 NOTE — Progress Notes (Signed)
PROGRESS NOTE    Peter Parsons  [id number] DOB: November 28, 1960 DOA: 02/11/2019 PCP: System, Provider Not In   Brief Narrative:  Peter Parsons is a 59 year old male with chronic schizophrenia with frequent suicidal ideations, bipolar disorder with depression, type 2 diabetes mellitus (controlled), CKD stage II, peripheral neuropathy, BPH, hypertension presented with altered mental status. Prolonged hospital stay awaiting placement in a memory care unit. Does not have capacity.  Hospital course/significant events 3/31:Prolonged hospital stay of almost 6 weeks awaiting for placement. Foley discontinued after prolonged use in the hospital (suspected was placed due to bladder outlet obstruction). Intermittent bladder scan showing urinary retention. 4/1:Foley placed in due to urinary retention noted on repeat bladder scans. Renal ultrasound done showed distended bladder with Foley not in the bladder. 4/2:Patient found to have gross hematuria with clots and Foley removed. Urology consulted and a 44 French coud catheter placed. Patient became septic with hypotension, fever and tachycardia. Received 5 L normal saline bolus with minimal improvement and transferred to stepdown unit. Started on Neo-Synephrine through peripheral IV and given 1 unit PRBC for acute blood loss anemia. 4/3: Stable off pressors and transferred back to medical floor.  4/21: Care assumed by this provider.  Patient has been ambulating in the unit at time of my evaluation this morning.  Slightly antalgic gait however is able to ambulate with a walker around the unit.  Sacral wound evaluated by wound care.  Dressing recommendations appreciated.  No fevers noted over interval.  Continues as inpatient due to lack of safe discharge plan.    4/24: no status changes over interval    Assessment & Plan:   Principal Problem:   Enterococcal bacteremia Active Problems:   Paranoid schizophrenia ([place])   Altered mental  status   Dementia ([place])   Weakness   Acute metabolic encephalopathy   Protein-calorie malnutrition, severe   Pressure injury of skin   Hypertension   Type 2 diabetes mellitus (HCC)   Dyslipidemia   BPH (benign prostatic hyperplasia)   Normocytic anemia   Thrombocytopenia (HCC)   Acute urinary retention   Urethral trauma   UTI (urinary tract infection)  Pressure Injury Coccyx Mid Stage 3, not POA --Full thickness tissue loss --Pt refused to be turned, per nursing.  --Nursing noted greenish drainage 4/20. --Mild temp of 100.5 early morning of 4/20 PLAN: Wound care consulted, recommendations appreciated Wound dressed, continue to stress turns and ambulation Hold off on antibiotics for now If patient spikes another temperature, will repeat blood cultures and initiate empiric antibiotic coverage  Pressure Injury right and left Heel Deep Tissue Pressure Injury  Unstageable, addressed by wound care  Severe sepsis with septic shock.  Resolved. Secondary to catheter associated UTI. Patient had multiple straight caths done past few days and Foley placed in on 4/1. Transferred to stepdown unit and multiple IV normal saline bolus received. Required Neo-Synephrine overnight and now stable off pressors. Sepsis resolved. Blood and urine culture growing Enterococcus faecalis. Antibiotic narrowed to ampicillin -completed course.  BPH with urinary retention, now with chronic Foley Continue Foley-Foley was placed by urology after having a traumatic urethral injury and they advised to keep the Foley in until he will follow-up with them as an outpatient in 2 weeks. -Failed voiding trail, Coude Foley re-inserted on 4/13. --Foley removed 4/20 because pt was trying to pull it out, and he wanted to try a voiding trial.  Bladder scan ordered. -4/21: Patient continues to retain.  Will attempt to place another Foley.  Patient may not  need coud catheter.  Nursing to attempt. -4/22: Patient was able  to void on his own.  No indication to replace catheter at this time  Constipation. -Miralax BID  Fungal rash on his face after shaving of beard.  -Try keeping that area dry. -Nystatin cream  Hypokalemia/hypomagnesemia.  Resolved -Replete electrolytes as needed and monitor.  Gross hematuria, resolved Secondary to traumatic catheterization.Foley removed and urology placed in a 20 [place] coud Foley without resistance. Recommend intermittent irrigation, keep Foley upon discharge and follow-up with urology in [place]. Urine now clear. Received 1 unit PRBC for drop in H&H.  Chronic schizophrenia Continue paliperidone, Klonopin, mirtazapine and risperidone. History of frequent suicidal ideation. Does not have capacity. Outpatient psychiatry follow-up.  Depression with bipolar disorder Continue Lexapro and trazodone.  AKI, resolved chronic kidney disease stage II Likely ATN with sepsis + bladder outlet obstruction.   Type 2 diabetes mellitus, controlled A1c of 4.5. CBG stable.  Thrombocytopenia, resolved Unclear etiology. Possibly from acute illness.   Peripheral neuropathy Continue gabapentin  Hypertension/ hyperlipidemia.  BP within goal. Off all blood pressure meds. Continue statin.  Dysphagia/dysarthria and left facial droop MRI negative for CVA. Continue dysphagia diet  Severe protein calorie malnutrition Continue nutrition supplement and Remeron  Failure to thrive PT recommends home health with 24-hour supervision. Awaiting placement.   DVT prophylaxis: SCDs Code Status: Full Family Communication: None today  disposition Plan:  Status is: Inpatient  Remains inpatient appropriate because:Unsafe d/c plan   Dispo: The patient is from: Home              Anticipated d/c is to: Group home              Anticipated d/c date is: > 3 days              Patient currently is not medically stable to d/c.  Lack of safe discharge plan.  TOC  team aware and looking for placement      Consultants:   Psych  urology  Procedures:   Foley catheterization  Antimicrobials:   none   Subjective: Seen and examined Sleeping, easily awakened  Objective: Vitals:   06/04/19 0802 06/04/19 1627 06/04/19 2344 06/05/19 0747  BP: 117/65 117/60 106/66 119/71  Pulse: (!) 57 72 62 (!) 53  Resp: 16 16 17 16   Temp: 98.2 F (36.8 C) 99.4 F (37.4 C) 98.1 F (36.7 C) 98.4 F (36.9 C)  TempSrc: Axillary Oral  Oral  SpO2: 96% 96% 95% 95%  Weight:      Height:        Intake/Output Summary (Last 24 hours) at 06/05/2019 1222 Last data filed at 06/05/2019 1137 Gross per 24 hour  Intake 720 ml  Output 2375 ml  Net -1655 ml   Filed Weights   05/18/19 0532 05/25/19 0500 06/01/19 0500  Weight: 81.6 kg 83.5 kg 82.4 kg    Examination:  Constitutional: NAD, resting, easily arousable HEENT: conjunctivae and lids normal, EOMI, head always tilted to the right CV: RRR no M,R,G. Distal pulses +2.  No cyanosis.   RESP: Clear to auscultation, normal WOB GI: +BS, NTND Extremities: Mild swelling in both feet SKIN: warm, dry and intact Neuro: II - XII grossly intact.  Sensation intact Psych:  Flattened affect    Data Reviewed: I have personally reviewed following labs and imaging studies  CBC: Recent Labs  Lab 06/01/19 0954  WBC 4.8  HGB 11.2*  HCT 32.8*  MCV 87.7  PLT 151   Basic  Metabolic Panel: Recent Labs  Lab 06/01/19 0954  NA 141  K 4.1  CL 101  CO2 30  GLUCOSE 100*  BUN 44*  CREATININE 0.97  CALCIUM 9.4  MG 1.7   GFR: Estimated Creatinine Clearance: 80 mL/min (by C-G formula based on SCr of 0.97 mg/dL). Liver Function Tests: Recent Labs  Lab 06/01/19 0954  AST 17  ALT 15  ALKPHOS 56  BILITOT 0.8  PROT 7.0  ALBUMIN 3.5   No results for input(s): LIPASE, AMYLASE in the last 168 hours. No results for input(s): AMMONIA in the last 168 hours. Coagulation Profile: No results for input(s): INR,  PROTIME in the last 168 hours. Cardiac Enzymes: No results for input(s): CKTOTAL, CKMB, CKMBINDEX, TROPONINI in the last 168 hours. BNP (last 3 results) No results for input(s): PROBNP in the last 8760 hours. HbA1C: No results for input(s): HGBA1C in the last 72 hours. CBG: Recent Labs  Lab 05/31/19 0729  GLUCAP 91   Lipid Profile: No results for input(s): CHOL, HDL, LDLCALC, TRIG, CHOLHDL, LDLDIRECT in the last 72 hours. Thyroid Function Tests: No results for input(s): TSH, T4TOTAL, FREET4, T3FREE, THYROIDAB in the last 72 hours. Anemia Panel: No results for input(s): VITAMINB12, FOLATE, FERRITIN, TIBC, IRON, RETICCTPCT in the last 72 hours. Sepsis Labs: No results for input(s): PROCALCITON, LATICACIDVEN in the last 168 hours.  No results found for this or any previous visit (from the past 240 hour(s)).       Radiology Studies: No results found.      Scheduled Meds: . atorvastatin  10 mg Oral Daily  . chlorhexidine  15 mL Mouth/Throat BID  . Chlorhexidine Gluconate Cloth  6 each Topical Daily  . collagenase   Topical Daily  . escitalopram  10 mg Oral Daily  . feeding supplement (NEPRO CARB STEADY)  237 mL Oral TID BM  . feeding supplement (PRO-STAT SUGAR FREE 64)  30 mL Oral BID  . gabapentin  300 mg Oral TID  . mirtazapine  45 mg Oral QHS  . multivitamin with minerals  1 tablet Oral Daily  . nystatin cream   Topical BID  . polyethylene glycol  17 g Oral BID  . risperiDONE  3 mg Oral BID  . senna-docusate  2 tablet Oral BID  . sodium chloride flush  10-40 mL Intracatheter Q12H  . tamsulosin  0.4 mg Oral Daily  . traZODone  150 mg Oral QHS   Continuous Infusions: . sodium chloride 250 mL (05/23/19 0309)     LOS: 68 days    Time spent: 35 min    Sidney Ace, MD [facility] Pager [phone]  If 7PM-7AM, please contact night-coverage 06/05/2019, 12:22 PM

## 2019-06-06 DIAGNOSIS — B952 Enterococcus as the cause of diseases classified elsewhere: Secondary | ICD-10-CM | POA: Diagnosis not present

## 2019-06-06 DIAGNOSIS — R7881 Bacteremia: Secondary | ICD-10-CM | POA: Diagnosis not present

## 2019-06-06 MED ORDER — PERMETHRIN 5 % EX CREA
TOPICAL_CREAM | Freq: Once | CUTANEOUS | Status: AC
  Filled 2019-06-06: qty 60

## 2019-06-06 NOTE — Progress Notes (Signed)
PROGRESS NOTE    Peter Parsons  [id number] DOB: 02-Jul-1960 DOA: 02/11/2019 PCP: System, Provider Not In   Brief Narrative:  Peter Parsons is a 59 year old male with chronic schizophrenia with frequent suicidal ideations, bipolar disorder with depression, type 2 diabetes mellitus (controlled), CKD stage II, peripheral neuropathy, BPH, hypertension presented with altered mental status. Prolonged hospital stay awaiting placement in a memory care unit. Does not have capacity.  Hospital course/significant events 3/31:Prolonged hospital stay of almost 6 weeks awaiting for placement. Foley discontinued after prolonged use in the hospital (suspected was placed due to bladder outlet obstruction). Intermittent bladder scan showing urinary retention. 4/1:Foley placed in due to urinary retention noted on repeat bladder scans. Renal ultrasound done showed distended bladder with Foley not in the bladder. 4/2:Patient found to have gross hematuria with clots and Foley removed. Urology consulted and a 47 French coud catheter placed. Patient became septic with hypotension, fever and tachycardia. Received 5 L normal saline bolus with minimal improvement and transferred to stepdown unit. Started on Neo-Synephrine through peripheral IV and given 1 unit PRBC for acute blood loss anemia. 4/3: Stable off pressors and transferred back to medical floor.  4/21: Care assumed by this provider.  Patient has been ambulating in the unit at time of my evaluation this morning.  Slightly antalgic gait however is able to ambulate with a walker around the unit.  Sacral wound evaluated by wound care.  Dressing recommendations appreciated.  No fevers noted over interval.  Continues as inpatient due to lack of safe discharge plan.     4/25: Patient seen and examined.  Headlights reported by overnight nurse.  Patient started on permethrin cream.  Still lethargic on my evaluation.  Flattened affect.  Does answer  questions upon prompting.    Assessment & Plan:   Principal Problem:   Enterococcal bacteremia Active Problems:   Paranoid schizophrenia ([place])   Altered mental status   Dementia ([place])   Weakness   Acute metabolic encephalopathy   Protein-calorie malnutrition, severe   Pressure injury of skin   Hypertension   Type 2 diabetes mellitus (HCC)   Dyslipidemia   BPH (benign prostatic hyperplasia)   Normocytic anemia   Thrombocytopenia (HCC)   Acute urinary retention   Urethral trauma   UTI (urinary tract infection)  Head lice Noted by night nurse on 06/05/2019 Started on permethrin cream 5% Isolation precautions   Pressure Injury Coccyx Mid Stage 3, not POA --Full thickness tissue loss --Pt refused to be turned, per nursing.  --Nursing noted greenish drainage 4/20. --Mild temp of 100.5 early morning of 4/20 PLAN: Wound care consulted, recommendations appreciated Wound dressed, continue to stress turns and ambulation Hold off on antibiotics for now If patient spikes another temperature, will repeat blood cultures and initiate empiric antibiotic coverage  Pressure Injury right and left Heel Deep Tissue Pressure Injury  Unstageable, addressed by wound care  Severe sepsis with septic shock.  Resolved. Secondary to catheter associated UTI. Patient had multiple straight caths done past few days and Foley placed in on 4/1. Transferred to stepdown unit and multiple IV normal saline bolus received. Required Neo-Synephrine overnight and now stable off pressors. Sepsis resolved. Blood and urine culture growing Enterococcus faecalis. Antibiotic narrowed to ampicillin -completed course.  BPH with urinary retention, now with chronic Foley Continue Foley-Foley was placed by urology after having a traumatic urethral injury and they advised to keep the Foley in until he will follow-up with them as an outpatient in 2 weeks. -Failed voiding trail,  Coude Foley re-inserted on  4/13. --Foley removed 4/20 because pt was trying to pull it out, and he wanted to try a voiding trial.  Bladder scan ordered. -4/21: Patient continues to retain.  Will attempt to place another Foley.  Patient may not need coud catheter.  Nursing to attempt. -4/22: Patient was able to void on his own.  No indication to replace catheter at this time  Constipation. -Miralax BID  Fungal rash on his face after shaving of beard.  -Try keeping that area dry. -Nystatin cream  Hypokalemia/hypomagnesemia.  Resolved -Replete electrolytes as needed and monitor.  Gross hematuria, resolved Secondary to traumatic catheterization.Foley removed and urology placed in a 20 [place] coud Foley without resistance. Recommend intermittent irrigation, keep Foley upon discharge and follow-up with urology in [place]. Urine now clear. Received 1 unit PRBC for drop in H&H.  Chronic schizophrenia Continue paliperidone, Klonopin, mirtazapine and risperidone. History of frequent suicidal ideation. Does not have capacity. Outpatient psychiatry follow-up.  Depression with bipolar disorder Continue Lexapro and trazodone.  AKI, resolved chronic kidney disease stage II Likely ATN with sepsis + bladder outlet obstruction.   Type 2 diabetes mellitus, controlled A1c of 4.5. CBG stable.  Thrombocytopenia, resolved Unclear etiology. Possibly from acute illness.   Peripheral neuropathy Continue gabapentin  Hypertension/ hyperlipidemia.  BP within goal. Off all blood pressure meds. Continue statin.  Dysphagia/dysarthria and left facial droop MRI negative for CVA. Continue dysphagia diet  Severe protein calorie malnutrition Continue nutrition supplement and Remeron  Failure to thrive PT recommends home health with 24-hour supervision. Awaiting placement.   DVT prophylaxis: SCDs Code Status: Full Family Communication: None today  disposition Plan:  Status is:  Inpatient  Remains inpatient appropriate because:Unsafe d/c plan   Dispo: The patient is from: Home              Anticipated d/c is to: Group home              Anticipated d/c date is: > 3 days              Patient currently is not medically stable to d/c.  Lack of safe discharge plan.  TOC team aware and looking for placement      Consultants:   Psych  urology  Procedures:   Foley catheterization  Antimicrobials:   none   Subjective: Seen and examined Sleeping, easily awakened  Objective: Vitals:   06/05/19 0747 06/05/19 1732 06/06/19 0026 06/06/19 0741  BP: 119/71 109/69 (!) 146/99 117/66  Pulse: (!) 53 (!) 59 (!) 105 71  Resp: 16 16 19 16   Temp: 98.4 F (36.9 C) 98.6 F (37 C) 98 F (36.7 C) 98.2 F (36.8 C)  TempSrc: Oral   Oral  SpO2: 95% 97% 98% 96%  Weight:      Height:        Intake/Output Summary (Last 24 hours) at 06/06/2019 1130 Last data filed at 06/06/2019 0914 Gross per 24 hour  Intake 970 ml  Output 2200 ml  Net -1230 ml   Filed Weights   05/18/19 0532 05/25/19 0500 06/01/19 0500  Weight: 81.6 kg 83.5 kg 82.4 kg    Examination:  Constitutional: NAD, resting, easily arousable HEENT: conjunctivae and lids normal, EOMI, head always tilted to the right CV: RRR no M,R,G. Distal pulses +2.  No cyanosis.   RESP: Clear to auscultation, normal WOB GI: +BS, NTND Extremities: Mild swelling in both feet SKIN: warm, dry and intact Neuro: II - XII grossly intact.  Sensation intact Psych:  Flattened affect    Data Reviewed: I have personally reviewed following labs and imaging studies  CBC: Recent Labs  Lab 06/01/19 0954  WBC 4.8  HGB 11.2*  HCT 32.8*  MCV 87.7  PLT 123XX123   Basic Metabolic Panel: Recent Labs  Lab 06/01/19 0954  NA 141  K 4.1  CL 101  CO2 30  GLUCOSE 100*  BUN 44*  CREATININE 0.97  CALCIUM 9.4  MG 1.7   GFR: Estimated Creatinine Clearance: 80 mL/min (by C-G formula based on SCr of 0.97 mg/dL). Liver  Function Tests: Recent Labs  Lab 06/01/19 0954  AST 17  ALT 15  ALKPHOS 56  BILITOT 0.8  PROT 7.0  ALBUMIN 3.5   No results for input(s): LIPASE, AMYLASE in the last 168 hours. No results for input(s): AMMONIA in the last 168 hours. Coagulation Profile: No results for input(s): INR, PROTIME in the last 168 hours. Cardiac Enzymes: No results for input(s): CKTOTAL, CKMB, CKMBINDEX, TROPONINI in the last 168 hours. BNP (last 3 results) No results for input(s): PROBNP in the last 8760 hours. HbA1C: No results for input(s): HGBA1C in the last 72 hours. CBG: Recent Labs  Lab 05/31/19 0729  GLUCAP 91   Lipid Profile: No results for input(s): CHOL, HDL, LDLCALC, TRIG, CHOLHDL, LDLDIRECT in the last 72 hours. Thyroid Function Tests: No results for input(s): TSH, T4TOTAL, FREET4, T3FREE, THYROIDAB in the last 72 hours. Anemia Panel: No results for input(s): VITAMINB12, FOLATE, FERRITIN, TIBC, IRON, RETICCTPCT in the last 72 hours. Sepsis Labs: No results for input(s): PROCALCITON, LATICACIDVEN in the last 168 hours.  No results found for this or any previous visit (from the past 240 hour(s)).       Radiology Studies: No results found.      Scheduled Meds: . atorvastatin  10 mg Oral Daily  . chlorhexidine  15 mL Mouth/Throat BID  . Chlorhexidine Gluconate Cloth  6 each Topical Daily  . collagenase   Topical Daily  . escitalopram  10 mg Oral Daily  . feeding supplement (NEPRO CARB STEADY)  237 mL Oral TID BM  . feeding supplement (PRO-STAT SUGAR FREE 64)  30 mL Oral BID  . gabapentin  300 mg Oral TID  . mirtazapine  45 mg Oral QHS  . multivitamin with minerals  1 tablet Oral Daily  . nystatin cream   Topical BID  . polyethylene glycol  17 g Oral BID  . risperiDONE  3 mg Oral BID  . senna-docusate  2 tablet Oral BID  . sodium chloride flush  10-40 mL Intracatheter Q12H  . tamsulosin  0.4 mg Oral Daily  . traZODone  150 mg Oral QHS   Continuous Infusions: .  sodium chloride 250 mL (05/23/19 0309)     LOS: 69 days    Time spent: 25 min    Sidney Ace, MD [facility] Pager [phone]  If 7PM-7AM, please contact night-coverage 06/06/2019, 11:30 AM

## 2019-06-06 NOTE — Progress Notes (Signed)
Nurse reported lice eggs in hair/scalp treatment for head lice and contact precautions initiated

## 2019-06-07 DIAGNOSIS — B952 Enterococcus as the cause of diseases classified elsewhere: Secondary | ICD-10-CM | POA: Diagnosis not present

## 2019-06-07 DIAGNOSIS — R7881 Bacteremia: Secondary | ICD-10-CM | POA: Diagnosis not present

## 2019-06-07 LAB — GLUCOSE, CAPILLARY: Glucose-Capillary: 89 mg/dL (ref 70–99)

## 2019-06-07 NOTE — Progress Notes (Addendum)
Pt very lethargic this morning not following commands but responds to pain. Dr. Priscella Mann aware that patient also not able to take oral meds this morning.  Dead lice noted on patient's scalp this morning. Head cleansed.

## 2019-06-07 NOTE — Progress Notes (Signed)
Nutrition Follow-up  DOCUMENTATION CODES:   Severe malnutrition in context of social or environmental circumstances  INTERVENTION:  ContinueNepro Shake poTID, each supplement provides 425 kcal and 19 grams protein.  Continue Pro-Stat 30 mL po BID, each supplement provides 100 kcal and 15 grams of protein.  Continue daily MVI.  NUTRITION DIAGNOSIS:   Severe Malnutrition related to social / environmental circumstances(inadequate oral intake) as evidenced by percent weight loss, moderate fat depletion, moderate muscle depletion, severe muscle depletion.  Ongoing - addressing with nutrition interventions.  GOAL:   Patient will meet greater than or equal to 90% of their needs  Progressing.  MONITOR:   PO intake, Supplement acceptance, Labs, Weight trends, I & O's  REASON FOR ASSESSMENT:   Malnutrition Screening Tool    ASSESSMENT:   59 year old male with PMHx of schizophrenia, HTN, DM who has been in [place] for 44 days and is now admitted with generalized weakness, dysphagia, slurred speech, left facial droop to rule out CVA.  Met with patient at bedside. He reports his appetite is good. According to chart patient continues to have variable PO intake. He is eating 25-100% of meals. Patient reports he ate well at breakfast but cannot recall how much he ate. He had finished about 25% of his lunch at time of RD assessment but was still eating lunch. Patient reports he is drinking Nepro TID, Pro-Stat BID and is ordering Magic Cup with his meals. He had eaten a Oceanographer Cup at lunch time.  Medications reviewed and include: Remeron 45 mg QHS, MVI daily, Miralax 17 grams BID.  Labs reviewed.  Diet Order:   Diet Order            DIET - DYS 1 Room service appropriate? Yes with Assist; Fluid consistency: Thin  Diet effective now             EDUCATION NEEDS:   No education needs have been identified at this time  Skin:  Skin Assessment: Skin Integrity Issues:(Stg 3 coccyx (1cm  x 0.7cm x 0.2cm); DTI right ankle (0.2cm x 0.2cm); DTI left heel (2.8cm x 3.5cm); DTI right heel (0.2cm x 0.2cm))  Last BM:  06/07/2019 small type 4  Height:   Ht Readings from Last 1 Encounters:  03/28/19 _0  (1.753 m)   Weight:   Wt Readings from Last 1 Encounters:  06/01/19 82.4 kg   Ideal Body Weight:  72.7 kg  BMI:  Body mass index is 26.83 kg/m.  Estimated Nutritional Needs:   Kcal:  2000-2200  Protein:  100-110 grams  Fluid:  >/= 2 L/day  Jacklynn Barnacle, MS, RD, LDN Pager number available on Amion

## 2019-06-07 NOTE — Progress Notes (Signed)
PROGRESS NOTE    Armone Parsons  [id number] DOB: [birth year] DOA: 02/11/2019 PCP: System, Provider Not In   Brief Narrative:  Peter Parsons is a 59 year old male with chronic schizophrenia with frequent suicidal ideations, bipolar disorder with depression, type 2 diabetes mellitus (controlled), CKD stage II, peripheral neuropathy, BPH, hypertension presented with altered mental status. Prolonged hospital stay awaiting placement in a memory care unit. Does not have capacity.  Hospital course/significant events 3/31:Prolonged hospital stay of almost 6 weeks awaiting for placement. Foley discontinued after prolonged use in the hospital (suspected was placed due to bladder outlet obstruction). Intermittent bladder scan showing urinary retention. 4/1:Foley placed in due to urinary retention noted on repeat bladder scans. Renal ultrasound done showed distended bladder with Foley not in the bladder. 4/2:Patient found to have gross hematuria with clots and Foley removed. Urology consulted and a 52 French coud catheter placed. Patient became septic with hypotension, fever and tachycardia. Received 5 L normal saline bolus with minimal improvement and transferred to stepdown unit. Started on Neo-Synephrine through peripheral IV and given 1 unit PRBC for acute blood loss anemia. 4/3: Stable off pressors and transferred back to medical floor.  4/21: Care assumed by this provider.  Patient has been ambulating in the unit at time of my evaluation this morning.  Slightly antalgic gait however is able to ambulate with a walker around the unit.  Sacral wound evaluated by wound care.  Dressing recommendations appreciated.  No fevers noted over interval.   4/25: Patient seen and examined.  Headlights reported by overnight nurse.  Patient started on permethrin cream.  Still lethargic on my evaluation.  Flattened affect.  Does answer questions upon prompting.  Continues as inpatient due to lack of  safe discharge plan.      Assessment & Plan:   Principal Problem:   Enterococcal bacteremia Active Problems:   Paranoid schizophrenia ([place])   Altered mental status   Dementia ([place])   Weakness   Acute metabolic encephalopathy   Protein-calorie malnutrition, severe   Pressure injury of skin   Hypertension   Type 2 diabetes mellitus (HCC)   Dyslipidemia   BPH (benign prostatic hyperplasia)   Normocytic anemia   Thrombocytopenia (HCC)   Acute urinary retention   Urethral trauma   UTI (urinary tract infection)  Head lice Noted by night nurse on 06/05/2019 Started on permethrin cream 5% Isolation precautions   Pressure Injury Coccyx Mid Stage 3, not POA --Full thickness tissue loss --Pt refused to be turned, per nursing.  --Nursing noted greenish drainage 4/20. --Mild temp of 100.5 early morning of 4/20 PLAN: Wound care consulted, recommendations appreciated Wound dressed, continue to stress turns and ambulation Hold off on antibiotics for now If patient spikes another temperature, will repeat blood cultures and initiate empiric antibiotic coverage  Pressure Injury right and left Heel Deep Tissue Pressure Injury  Unstageable, addressed by wound care  Severe sepsis with septic shock.  Resolved. Secondary to catheter associated UTI. Patient had multiple straight caths done past few days and Foley placed in on 4/1. Transferred to stepdown unit and multiple IV normal saline bolus received. Required Neo-Synephrine overnight and now stable off pressors. Sepsis resolved. Blood and urine culture growing Enterococcus faecalis. Antibiotic narrowed to ampicillin -completed course.  BPH with urinary retention, now with chronic Foley Continue Foley-Foley was placed by urology after having a traumatic urethral injury and they advised to keep the Foley in until he will follow-up with them as an outpatient in 2 weeks. -Failed voiding trail,  Coude Foley re-inserted on  4/13. --Foley removed 4/20 because pt was trying to pull it out, and he wanted to try a voiding trial.  Bladder scan ordered. -4/21: Patient continues to retain.  Will attempt to place another Foley.  Patient may not need coud catheter.  Nursing to attempt. -4/22: Patient was able to void on his own.  No indication to replace catheter at this time  Constipation. -Miralax BID  Fungal rash on his face after shaving of beard.  -Try keeping that area dry. -Nystatin cream  Hypokalemia/hypomagnesemia.  Resolved -Replete electrolytes as needed and monitor.  Gross hematuria, resolved Secondary to traumatic catheterization.Foley removed and urology placed in a 20 [place] coud Foley without resistance. Recommend intermittent irrigation, keep Foley upon discharge and follow-up with urology in [place]. Urine now clear. Received 1 unit PRBC for drop in H&H.  Chronic schizophrenia Continue paliperidone, Klonopin, mirtazapine and risperidone. History of frequent suicidal ideation. Does not have capacity. Outpatient psychiatry follow-up.  Depression with bipolar disorder Continue Lexapro and trazodone.  AKI, resolved chronic kidney disease stage II Likely ATN with sepsis + bladder outlet obstruction.   Type 2 diabetes mellitus, controlled A1c of 4.5. CBG stable.  Thrombocytopenia, resolved Unclear etiology. Possibly from acute illness.   Peripheral neuropathy Continue gabapentin  Hypertension/ hyperlipidemia.  BP within goal. Off all blood pressure meds. Continue statin.  Dysphagia/dysarthria and left facial droop MRI negative for CVA. Continue dysphagia diet  Severe protein calorie malnutrition Continue nutrition supplement and Remeron  Failure to thrive PT recommends home health with 24-hour supervision. Awaiting placement.   DVT prophylaxis: SCDs Code Status: Full Family Communication: None today  disposition Plan:  Status is:  Inpatient  Remains inpatient appropriate because:Unsafe d/c plan   Dispo: The patient is from: Home              Anticipated d/c is to: Group home              Anticipated d/c date is: > 3 days              Patient currently is not medically stable to d/c.  Lack of safe discharge plan.  TOC team aware and looking for placement      Consultants:   Psych  urology  Procedures:   Foley catheterization  Antimicrobials:   none   Subjective: Seen and examined Sleeping, easily awakened  Objective: Vitals:   06/06/19 0741 06/06/19 1633 06/06/19 2344 06/07/19 0754  BP: 117/66 134/88 127/89 109/78  Pulse: 71 88 86 (!) 57  Resp: 16 18 19 18   Temp: 98.2 F (36.8 C) 98.9 F (37.2 C) 97.6 F (36.4 C) 98.2 F (36.8 C)  TempSrc: Oral Oral    SpO2: 96% 98% 98% 96%  Weight:      Height:        Intake/Output Summary (Last 24 hours) at 06/07/2019 1105 Last data filed at 06/07/2019 [id number] Gross per 24 hour  Intake 237 ml  Output 2850 ml  Net -2613 ml   Filed Weights   05/18/19 0532 05/25/19 0500 06/01/19 0500  Weight: 81.6 kg 83.5 kg 82.4 kg    Examination:  Constitutional: NAD, resting, easily arousable HEENT: conjunctivae and lids normal, EOMI, head always tilted to the right CV: RRR no M,R,G. Distal pulses +2.  No cyanosis.   RESP: Clear to auscultation, normal WOB GI: +BS, NTND Extremities: Mild swelling in both feet SKIN: warm, dry and intact Neuro: II - XII grossly intact.  Sensation intact Psych:  Flattened affect    Data Reviewed: I have personally reviewed following labs and imaging studies  CBC: Recent Labs  Lab 06/01/19 0954  WBC 4.8  HGB 11.2*  HCT 32.8*  MCV 87.7  PLT 123XX123   Basic Metabolic Panel: Recent Labs  Lab 06/01/19 0954  NA 141  K 4.1  CL 101  CO2 30  GLUCOSE 100*  BUN 44*  CREATININE 0.97  CALCIUM 9.4  MG 1.7   GFR: Estimated Creatinine Clearance: 80 mL/min (by C-G formula based on SCr of 0.97 mg/dL). Liver Function  Tests: Recent Labs  Lab 06/01/19 0954  AST 17  ALT 15  ALKPHOS 56  BILITOT 0.8  PROT 7.0  ALBUMIN 3.5   No results for input(s): LIPASE, AMYLASE in the last 168 hours. No results for input(s): AMMONIA in the last 168 hours. Coagulation Profile: No results for input(s): INR, PROTIME in the last 168 hours. Cardiac Enzymes: No results for input(s): CKTOTAL, CKMB, CKMBINDEX, TROPONINI in the last 168 hours. BNP (last 3 results) No results for input(s): PROBNP in the last 8760 hours. HbA1C: No results for input(s): HGBA1C in the last 72 hours. CBG: Recent Labs  Lab 06/07/19 0751  GLUCAP 89   Lipid Profile: No results for input(s): CHOL, HDL, LDLCALC, TRIG, CHOLHDL, LDLDIRECT in the last 72 hours. Thyroid Function Tests: No results for input(s): TSH, T4TOTAL, FREET4, T3FREE, THYROIDAB in the last 72 hours. Anemia Panel: No results for input(s): VITAMINB12, FOLATE, FERRITIN, TIBC, IRON, RETICCTPCT in the last 72 hours. Sepsis Labs: No results for input(s): PROCALCITON, LATICACIDVEN in the last 168 hours.  No results found for this or any previous visit (from the past 240 hour(s)).       Radiology Studies: No results found.      Scheduled Meds: . atorvastatin  10 mg Oral Daily  . chlorhexidine  15 mL Mouth/Throat BID  . Chlorhexidine Gluconate Cloth  6 each Topical Daily  . collagenase   Topical Daily  . escitalopram  10 mg Oral Daily  . feeding supplement (NEPRO CARB STEADY)  237 mL Oral TID BM  . feeding supplement (PRO-STAT SUGAR FREE 64)  30 mL Oral BID  . gabapentin  300 mg Oral TID  . mirtazapine  45 mg Oral QHS  . multivitamin with minerals  1 tablet Oral Daily  . nystatin cream   Topical BID  . polyethylene glycol  17 g Oral BID  . risperiDONE  3 mg Oral BID  . senna-docusate  2 tablet Oral BID  . sodium chloride flush  10-40 mL Intracatheter Q12H  . tamsulosin  0.4 mg Oral Daily  . traZODone  150 mg Oral QHS   Continuous Infusions: . sodium  chloride 250 mL (05/23/19 0309)     LOS: 70 days    Time spent: 25 min    Sidney Ace, MD [facility] Pager [phone]  If 7PM-7AM, please contact night-coverage 06/07/2019, 11:05 AM

## 2019-06-08 DIAGNOSIS — B952 Enterococcus as the cause of diseases classified elsewhere: Secondary | ICD-10-CM | POA: Diagnosis not present

## 2019-06-08 DIAGNOSIS — R7881 Bacteremia: Secondary | ICD-10-CM | POA: Diagnosis not present

## 2019-06-08 NOTE — Consult Note (Addendum)
WOC follow-up: Reason for Consult: Reassessment of pressure injuries. Wound type: Sacrum is slowly improving in appearance and decreasing in size; 50% slough, 50% red; unstageable pressure injury 1X1X.2cm, small amt tan drainage, no odor or fluctuance. Right ankle with previously noted dark red-purple deep tissue pressure injury has resolved; generalized erythremia blanches, no open wounds. Left heel with intact dark red fluid filled blister; deep tissue pressure injury has decreased in size; 3X2 with intact skin Right heel with intact dark red fluid filled blister; deep tissue pressure injury has decreased in size with intact skin; .5X1cm Pressure Injury POA: no Dressing procedure/placement/frequency: Continue present plan of care as follows to provide ezymatic debridement of nonviable tissue: Apply Santyl to sacrum wound Q day, then cover with moist 2X2 and foam dressing.  (Change foam dressing Q 3 days or PRN soiling.)  Foam dressings to protect from further injury and float heels with Prevalon boots to reduce pressure and protect from further injury with foam dressings.  [facility] team will reassess weekly to determine if a change in the plan of care is indicated at that time.  Julien Girt MSN, RN, [place], [place], [place]

## 2019-06-08 NOTE — Plan of Care (Signed)
Pt unable to participate in general education secondary to altered mental status

## 2019-06-08 NOTE — Progress Notes (Signed)
PROGRESS NOTE    Peter Parsons  [id number] DOB: Jul 22, 1960 DOA: 02/11/2019 PCP: System, Provider Not In   Brief Narrative:  Peter Parsons is a 59 year old male with chronic schizophrenia with frequent suicidal ideations, bipolar disorder with depression, type 2 diabetes mellitus (controlled), CKD stage II, peripheral neuropathy, BPH, hypertension presented with altered mental status. Prolonged hospital stay awaiting placement in a memory care unit. Does not have capacity.  Hospital course/significant events 3/31:Prolonged hospital stay of almost 6 weeks awaiting for placement. Foley discontinued after prolonged use in the hospital (suspected was placed due to bladder outlet obstruction). Intermittent bladder scan showing urinary retention. 4/1:Foley placed in due to urinary retention noted on repeat bladder scans. Renal ultrasound done showed distended bladder with Foley not in the bladder. 4/2:Patient found to have gross hematuria with clots and Foley removed. Urology consulted and a 47 French coud catheter placed. Patient became septic with hypotension, fever and tachycardia. Received 5 L normal saline bolus with minimal improvement and transferred to stepdown unit. Started on Neo-Synephrine through peripheral IV and given 1 unit PRBC for acute blood loss anemia. 4/3: Stable off pressors and transferred back to medical floor.  4/21: Care assumed by this provider.  Patient has been ambulating in the unit at time of my evaluation this morning.  Slightly antalgic gait however is able to ambulate with a walker around the unit.  Sacral wound evaluated by wound care.  Dressing recommendations appreciated.  No fevers noted over interval.   4/25: Patient seen and examined.  Headlights reported by overnight nurse.  Patient started on permethrin cream.  Still lethargic on my evaluation.  Flattened affect.  Does answer questions upon prompting.  4/27: Patient seen and examined.   Examined scalp with bedside RN did not reveal any active lice.  Suspect dandruff.  Patient still very lethargic and flattened today.  Does respond upon repeat prompting.  Vital signs stable.  I discussed with case manager the possibility of a hospice evaluation.  She will reach out to her with her care representative.  Continues as inpatient due to lack of safe discharge plan.      Assessment & Plan:   Principal Problem:   Enterococcal bacteremia Active Problems:   Paranoid schizophrenia ([place])   Altered mental status   Dementia ([place])   Weakness   Acute metabolic encephalopathy   Protein-calorie malnutrition, severe   Pressure injury of skin   Hypertension   Type 2 diabetes mellitus (HCC)   Dyslipidemia   BPH (benign prostatic hyperplasia)   Normocytic anemia   Thrombocytopenia (H[facility])   Acute urinary retention   Urethral trauma   UTI (urinary tract infection)  Head lice, felt unlikely but difficult to entirely exclude Noted by night nurse on 06/05/2019 Started on permethrin cream 5% Isolation precautions   Pressure Injury Coccyx Mid Stage 3, not POA --Full thickness tissue loss --Pt refused to be turned, per nursing.  --Nursing noted greenish drainage 4/20. --Mild temp of 100.5 early morning of 4/20 PLAN: Wound care consulted, recommendations appreciated Wound dressed, continue to stress turns and ambulation Hold off on antibiotics for now If patient spikes another temperature, will repeat blood cultures and initiate empiric antibiotic coverage  Pressure Injury right and left Heel Deep Tissue Pressure Injury  Unstageable, addressed by wound care  Severe sepsis with septic shock.  Resolved. Secondary to catheter associated UTI. Patient had multiple straight caths done past few days and Foley placed in on 4/1. Transferred to stepdown unit and multiple IV normal saline  bolus received. Required Neo-Synephrine overnight and now stable off pressors. Sepsis  resolved. Blood and urine culture growing Enterococcus faecalis. Antibiotic narrowed to ampicillin -completed course.  BPH with urinary retention, now with chronic Foley Continue Foley-Foley was placed by urology after having a traumatic urethral injury and they advised to keep the Foley in until he will follow-up with them as an outpatient in 2 weeks. -Failed voiding trail, Coude Foley re-inserted on 4/13. --Foley removed 4/20 because pt was trying to pull it out, and he wanted to try a voiding trial.  Bladder scan ordered. -4/21: Patient continues to retain.  Will attempt to place another Foley.  Patient may not need coud catheter.  Nursing to attempt. -4/22: Patient was able to void on his own.  No indication to replace catheter at this time  Constipation. -Miralax BID  Fungal rash on his face after shaving of beard.  -Try keeping that area dry. -Nystatin cream  Hypokalemia/hypomagnesemia.  Resolved -Replete electrolytes as needed and monitor.  Gross hematuria, resolved Secondary to traumatic catheterization.Foley removed and urology placed in a 20 [place] coud Foley without resistance. Recommend intermittent irrigation, keep Foley upon discharge and follow-up with urology in [place]. Urine now clear. Received 1 unit PRBC for drop in H&H.  Chronic schizophrenia Continue paliperidone, Klonopin, mirtazapine and risperidone. History of frequent suicidal ideation. Does not have capacity. Outpatient psychiatry follow-up.  Depression with bipolar disorder Continue Lexapro and trazodone.  AKI, resolved chronic kidney disease stage II Likely ATN with sepsis + bladder outlet obstruction.   Type 2 diabetes mellitus, controlled A1c of 4.5. CBG stable.  Thrombocytopenia, resolved Unclear etiology. Possibly from acute illness.   Peripheral neuropathy Continue gabapentin  Hypertension/ hyperlipidemia.  BP within goal. Off all blood pressure meds. Continue  statin.  Dysphagia/dysarthria and left facial droop MRI negative for CVA. Continue dysphagia diet  Severe protein calorie malnutrition Continue nutrition supplement and Remeron  Failure to thrive PT recommends home health with 24-hour supervision. Awaiting placement.   DVT prophylaxis: SCDs Code Status: Full Family Communication: None today  disposition Plan:  Status is: Inpatient  Remains inpatient appropriate because:Unsafe d/c plan   Dispo: The patient is from: Home              Anticipated d/c is to: Group home              Anticipated d/c date is: > 3 days              Patient currently is not medically stable to d/c.  Lack of safe discharge plan.  TOC team aware and looking for placement.  I have discussed with case manager Pryor Montes the possibility of a hospice evaluation.  Patient may be appropriate for a compassionate discharge to hospice care.  Currently patient with very flattened affect.  Increasingly less responsive.  Poor p.o. intake.  Inability to care for self.  Hospice evaluation greatly appreciated.      Consultants:   Psych  urology  Procedures:   Foley catheterization  Antimicrobials:   none   Subjective: Seen and examined Sleeping, easily awakened Flattened affect  Objective: Vitals:   06/07/19 1200 06/07/19 1528 06/08/19 0018 06/08/19 0904  BP: 110/68 131/79 107/67 (!) 137/93  Pulse: 66 91 67 88  Resp: 20 18 16 18   Temp: 97.6 F (36.4 C)  98.4 F (36.9 C) 98.2 F (36.8 C)  TempSrc:   Oral Oral  SpO2: 98% 96% 97% 97%  Weight:      Height:  Intake/Output Summary (Last 24 hours) at 06/08/2019 1203 Last data filed at 06/07/2019 2055 Gross per 24 hour  Intake 237 ml  Output 10 ml  Net 227 ml   Filed Weights   05/18/19 0532 05/25/19 0500 06/01/19 0500  Weight: 81.6 kg 83.5 kg 82.4 kg    Examination:  Constitutional: NAD, resting, easily arousable HEENT: conjunctivae and lids normal, EOMI, head always tilted to  the right CV: RRR no M,R,G. Distal pulses +2.  No cyanosis.   RESP: Clear to auscultation, normal WOB GI: +BS, NTND Extremities: Mild swelling in both feet SKIN: warm, dry and intact Neuro: II - XII grossly intact.  Sensation intact Psych:  Flattened affect    Data Reviewed: I have personally reviewed following labs and imaging studies  CBC: No results for input(s): WBC, NEUTROABS, HGB, HCT, MCV, PLT in the last 168 hours. Basic Metabolic Panel: No results for input(s): NA, K, CL, CO2, GLUCOSE, BUN, CREATININE, CALCIUM, MG, PHOS in the last 168 hours. GFR: Estimated Creatinine Clearance: 80 mL/min (by C-G formula based on SCr of 0.97 mg/dL). Liver Function Tests: No results for input(s): AST, ALT, ALKPHOS, BILITOT, PROT, ALBUMIN in the last 168 hours. No results for input(s): LIPASE, AMYLASE in the last 168 hours. No results for input(s): AMMONIA in the last 168 hours. Coagulation Profile: No results for input(s): INR, PROTIME in the last 168 hours. Cardiac Enzymes: No results for input(s): CKTOTAL, CKMB, CKMBINDEX, TROPONINI in the last 168 hours. BNP (last 3 results) No results for input(s): PROBNP in the last 8760 hours. HbA1C: No results for input(s): HGBA1C in the last 72 hours. CBG: Recent Labs  Lab 06/07/19 0751  GLUCAP 89   Lipid Profile: No results for input(s): CHOL, HDL, LDLCALC, TRIG, CHOLHDL, LDLDIRECT in the last 72 hours. Thyroid Function Tests: No results for input(s): TSH, T4TOTAL, FREET4, T3FREE, THYROIDAB in the last 72 hours. Anemia Panel: No results for input(s): VITAMINB12, FOLATE, FERRITIN, TIBC, IRON, RETICCTPCT in the last 72 hours. Sepsis Labs: No results for input(s): PROCALCITON, LATICACIDVEN in the last 168 hours.  No results found for this or any previous visit (from the past 240 hour(s)).       Radiology Studies: No results found.      Scheduled Meds: . atorvastatin  10 mg Oral Daily  . chlorhexidine  15 mL Mouth/Throat BID   . Chlorhexidine Gluconate Cloth  6 each Topical Daily  . collagenase   Topical Daily  . escitalopram  10 mg Oral Daily  . feeding supplement (NEPRO CARB STEADY)  237 mL Oral TID BM  . feeding supplement (PRO-STAT SUGAR FREE 64)  30 mL Oral BID  . gabapentin  300 mg Oral TID  . mirtazapine  45 mg Oral QHS  . multivitamin with minerals  1 tablet Oral Daily  . nystatin cream   Topical BID  . polyethylene glycol  17 g Oral BID  . risperiDONE  3 mg Oral BID  . senna-docusate  2 tablet Oral BID  . sodium chloride flush  10-40 mL Intracatheter Q12H  . tamsulosin  0.4 mg Oral Daily  . traZODone  150 mg Oral QHS   Continuous Infusions: . sodium chloride 250 mL (05/23/19 0309)     LOS: 71 days    Time spent: 25 min    Sidney Ace, MD [facility] Pager [phone]  If 7PM-7AM, please contact night-coverage 06/08/2019, 12:03 PM

## 2019-06-09 DIAGNOSIS — Z515 Encounter for palliative care: Secondary | ICD-10-CM

## 2019-06-09 DIAGNOSIS — Z7189 Other specified counseling: Secondary | ICD-10-CM

## 2019-06-09 DIAGNOSIS — R627 Adult failure to thrive: Secondary | ICD-10-CM

## 2019-06-09 DIAGNOSIS — R45851 Suicidal ideations: Secondary | ICD-10-CM

## 2019-06-09 NOTE — Progress Notes (Signed)
PROGRESS NOTE    Peter Parsons  [id number] DOB: 1960/02/16 DOA: 02/11/2019 PCP: System, Provider Not In   Brief Narrative:  Peter Parsons is a 59 year old male with chronic schizophrenia with frequent suicidal ideations, bipolar disorder with depression, type 2 diabetes mellitus (controlled), CKD stage II, peripheral neuropathy, BPH, hypertension presented with altered mental status. Prolonged hospital stay awaiting placement in a memory care unit. Does not have capacity.  Hospital course/significant events 3/31:Prolonged hospital stay of almost 6 weeks awaiting for placement. Foley discontinued after prolonged use in the hospital (suspected was placed due to bladder outlet obstruction). Intermittent bladder scan showing urinary retention. 4/1:Foley placed in due to urinary retention noted on repeat bladder scans. Renal ultrasound done showed distended bladder with Foley not in the bladder. 4/2:Patient found to have gross hematuria with clots and Foley removed. Urology consulted and a 24 French coud catheter placed. Patient became septic with hypotension, fever and tachycardia. Received 5 L normal saline bolus with minimal improvement and transferred to stepdown unit. Started on Neo-Synephrine through peripheral IV and given 1 unit PRBC for acute blood loss anemia. 4/3: Stable off pressors and transferred back to medical floor.  4/21: Care assumed by this provider.  Patient has been ambulating in the unit at time of my evaluation this morning.  Slightly antalgic gait however is able to ambulate with a walker around the unit.  Sacral wound evaluated by wound care.  Dressing recommendations appreciated.  No fevers noted over interval.   4/25: Patient seen and examined.  Headlights reported by overnight nurse.  Patient started on permethrin cream.  Still lethargic on my evaluation.  Flattened affect.  Does answer questions upon prompting.  4/27: Patient seen and examined.   Examined scalp with bedside RN did not reveal any active lice.  Suspect dandruff.  Patient still very lethargic and flattened today.  Does respond upon repeat prompting.  Vital signs stable.  I discussed with case manager the possibility of a hospice evaluation.  She will reach out to her with her care representative.  4/28: Patient himself is sleeping and difficult to arouse when I first go and however when I reintroduced myself and tell him it is nice to see him again he opens his eyes and looks at me.   [name] is recommending palliative care consultation presumably for possible hospice evaluation.  I am not sure he will be able to participate meaningfully in conversation however he is wake up more in the afternoon so that might be a good time for them to come.  Palliative care consultation requested.  Continues as inpatient due to lack of safe discharge plan.    Physical Exam: Blood pressure (!) 137/92, pulse (!) 102, temperature 98.2 F (36.8 C), temperature source Oral, resp. rate 20, height 5\' 9"  (1.753 m), weight 82.4 kg, SpO2 96 %. Gen: Patient is sleepy and lethargic however does wake up to touch and voice. CVS: S1-S2, regulary, no gallops Respiratory:  decreased air entry likely secondary to decreased inspiratory effort GI: NABS, soft, NT  LE: No edema. No cyanosis Neuro:    Assessment & Plan:   Principal Problem:   Enterococcal bacteremia Active Problems:   Paranoid schizophrenia ([place])   Altered mental status   Dementia (HCC)   Weakness   Acute metabolic encephalopathy   Protein-calorie malnutrition, severe   Pressure injury of skin   Hypertension   Type 2 diabetes mellitus (HCC)   Dyslipidemia   BPH (benign prostatic hyperplasia)   Normocytic anemia  Thrombocytopenia ([place])   Acute urinary retention   Urethral trauma   UTI (urinary tract infection)   Active problems:  Head lice I examined patient myself and did not see any evidence of lice or  eggs. Patient is already been treated with permethrin cream 5%.  Pressure injury, stage III, on coccyx and bilateral heels not POA Full-thickness tissue loss, being followed by wound care which is appreciated. Patient has remained afebrile with no leukocytosis  BPH with urinary retention, now with chronic Foley Foley was placed by urology after having a traumatic urethral injury  Failed voiding trail, Coude Foley re-inserted on 4/13. Failed voiding trial 4/20 again Patient was able to void on his own on 4/22. Foley discontinued.  Constipation. Miralax BID  Chronic schizophrenia Continue paliperidone, Klonopin, mirtazapine and risperidone. History of frequent suicidal ideation. Does not have capacity. Outpatient psychiatry follow-up.  Depression with bipolar disorder Continue Lexapro and trazodone.  Fungal rash on his face after shaving of beard.  Try keeping that area dry. Nystatin cream  Peripheral neuropathy Continue gabapentin  Severe protein calorie malnutrition Continue nutrition supplement and Remeron Patient has had decreased p.o. intake over the past several days.  Failure to thrive PT recommends home health with 24-hour supervision. Awaiting placement. Possibility of compassionate hospice is being pursued, palliative care consult requested per case management request.   Previous problems, now resolved: Severe sepsis with septic shock.  Resolved. Secondary to catheter associated UTI. Patient had multiple straight caths done past few days and Foley placed in on 4/1. Transferred to stepdown unit and multiple IV normal saline bolus received. Required Neo-Synephrine overnight and now stable off pressors. Sepsis resolved. Blood and urine culture growing Enterococcus faecalis. Antibiotic narrowed to ampicillin -completed course.  Hypokalemia/hypomagnesemia.   Resolved Replete electrolytes as needed and monitor.  Gross hematuria, resolved Secondary to  traumatic catheterization.Foley removed and urology placed in a 20 [place] coud Foley without resistance. Recommend intermittent irrigation, keep Foley upon discharge and follow-up with urology in [place]. Urine now clear. Received 1 unit PRBC for drop in H&H.  AKI, resolved chronic kidney disease stage II Likely ATN with sepsis + bladder outlet obstruction.   Type 2 diabetes mellitus, controlled A1c of 4.5. CBG stable.  Thrombocytopenia, resolved Unclear etiology. Possibly from acute illness.    Hypertension/ hyperlipidemia.  BP within goal. Off all blood pressure meds. Continue statin.  Dysphagia/dysarthria and left facial droop MRI negative for CVA. Continue dysphagia diet   DVT prophylaxis: SCD Code Status: Full Family Communication: None Disposition Plan:   Patient is from: [place]  Anticipated Discharge Location: Memory care unit  Barriers to Discharge: Patient has now been here for 72 days, presumably barrier to discharge is finding an appropriate memory care unit.  Is patient medically stable for Discharge: Yes although he is functionally declining over the course of his stay.  Lack of safe discharge plan.  TOC team aware and looking for placement.  I have discussed with case manager Pryor Montes the possibility of a hospice evaluation.  Patient may be appropriate for a compassionate discharge to hospice care.  Currently patient with very flattened affect.  Increasingly less responsive.  Poor p.o. intake.  Inability to care for self.  Hospice evaluation greatly appreciated.   Consultants:   Psych  urology  Procedures:   Foley catheterization  Antimicrobials:   none   Objective: Vitals:   06/08/19 0018 06/08/19 0904 06/08/19 1607 06/09/19 0005  BP: 107/67 (!) 137/93 137/85 (!) 137/92  Pulse: 67 88 91 (!)  102  Resp: 16 18 18 20   Temp: 98.4 F (36.9 C) 98.2 F (36.8 C) 98.6 F (37 C) 98.2 F (36.8 C)  TempSrc: Oral Oral  Oral  SpO2: 97%  97% 97% 96%  Weight:      Height:        Intake/Output Summary (Last 24 hours) at 06/09/2019 1337 Last data filed at 06/09/2019 0005 Gross per 24 hour  Intake 237 ml  Output 700 ml  Net -463 ml   Filed Weights   05/18/19 0532 05/25/19 0500 06/01/19 0500  Weight: 81.6 kg 83.5 kg 82.4 kg      Data Reviewed: I have personally reviewed following labs and imaging studies  CBC: No results for input(s): WBC, NEUTROABS, HGB, HCT, MCV, PLT in the last 168 hours. Basic Metabolic Panel: No results for input(s): NA, K, CL, CO2, GLUCOSE, BUN, CREATININE, CALCIUM, MG, PHOS in the last 168 hours. GFR: Estimated Creatinine Clearance: 80 mL/min (by C-G formula based on SCr of 0.97 mg/dL). Liver Function Tests: No results for input(s): AST, ALT, ALKPHOS, BILITOT, PROT, ALBUMIN in the last 168 hours. No results for input(s): LIPASE, AMYLASE in the last 168 hours. No results for input(s): AMMONIA in the last 168 hours. Coagulation Profile: No results for input(s): INR, PROTIME in the last 168 hours. Cardiac Enzymes: No results for input(s): CKTOTAL, CKMB, CKMBINDEX, TROPONINI in the last 168 hours. BNP (last 3 results) No results for input(s): PROBNP in the last 8760 hours. HbA1C: No results for input(s): HGBA1C in the last 72 hours. CBG: Recent Labs  Lab 06/07/19 0751  GLUCAP 89   Lipid Profile: No results for input(s): CHOL, HDL, LDLCALC, TRIG, CHOLHDL, LDLDIRECT in the last 72 hours. Thyroid Function Tests: No results for input(s): TSH, T4TOTAL, FREET4, T3FREE, THYROIDAB in the last 72 hours. Anemia Panel: No results for input(s): VITAMINB12, FOLATE, FERRITIN, TIBC, IRON, RETICCTPCT in the last 72 hours. Sepsis Labs: No results for input(s): PROCALCITON, LATICACIDVEN in the last 168 hours.  No results found for this or any previous visit (from the past 240 hour(s)).       Radiology Studies: No results found.      Scheduled Meds: . atorvastatin  10 mg Oral Daily  .  chlorhexidine  15 mL Mouth/Throat BID  . Chlorhexidine Gluconate Cloth  6 each Topical Daily  . collagenase   Topical Daily  . escitalopram  10 mg Oral Daily  . feeding supplement (NEPRO CARB STEADY)  237 mL Oral TID BM  . feeding supplement (PRO-STAT SUGAR FREE 64)  30 mL Oral BID  . gabapentin  300 mg Oral TID  . mirtazapine  45 mg Oral QHS  . multivitamin with minerals  1 tablet Oral Daily  . nystatin cream   Topical BID  . polyethylene glycol  17 g Oral BID  . risperiDONE  3 mg Oral BID  . senna-docusate  2 tablet Oral BID  . sodium chloride flush  10-40 mL Intracatheter Q12H  . tamsulosin  0.4 mg Oral Daily  . traZODone  150 mg Oral QHS   Continuous Infusions: . sodium chloride 250 mL (05/23/19 0309)     LOS: 72 days    Time spent: 25 min    Vashti Hey, MD [facility] Pager [phone]  If 7PM-7AM, please contact night-coverage 06/09/2019, 1:37 PM

## 2019-06-09 NOTE — Consult Note (Signed)
Consultation Note Date: 06/09/2019   Patient Name: Peter Parsons  DOB: 1961/01/14  MRN: [id number]  Age / Sex: 59 y.o., male   PCP: System, Provider Not In Referring Physician: Oren Binet*   REASON FOR CONSULTATION:Establishing goals of care  Palliative Care consult requested for goals of care discussion in this 59 y.o. male with multiple medical problems including type 2 diabetes mellitus, hypertension, and schizophrenia. Per notations prior to admission patient was in the ED [facility] ([facility]) for 44 days (01/25/2019) due to not acting like himself and increased weakness. Pt presented to ED for medical work-up on 03/28/19 due to complaints of generalized weakness and dysphagia. He was also noted to have a left facial droop. During work-up Chest x-rays showed no acute abnormalities. CT of head showed mild right temporal scalp swelling, with no other acute abnormalities. Since his admission patient has been awaiting placement in a memory care unit. He continues to have episodes of lethargy, refusing oral nutrition and medications at times.   Clinical Assessment and Goals of Care: I have reviewed medical records including lab results, imaging, Epic notes, and MAR, received report from the bedside RN, and assessed the patient. I met at the bedside with Peter Parsons to discuss diagnosis prognosis, GOC, EOL wishes, disposition and options. I have attempted to reach patient's legal guardian Peter Parsons and voicemail has been left with my contact information.   I introduced Palliative Medicine as specialized medical care for people living with serious illness. It focuses on providing relief from the symptoms and stress of a serious illness. The goal is to improve quality of life for the patient.   Peter Parsons was originally flat with no verbal response to my introduction and would not make eye contact. When I asked if I could perform an assessment he responded yes. I was able to perform head to  toe assessment and patient was cooperative and followed commands. Moving all extremities voluntarily and on command. He is awake, alert, and oriented. RN at the bedside. Patient answered all orientation questions appropriately x4.   I again introduced myself and my role explaining to Peter Parsons I was there to spend time learning more about him as a person and helping him in anyway that I could. He now turned and made eye contact, smiled and began to openly speak with me. He asked me to call him Peter Parsons, stating that is what his friends and family called him. Acknowledge his request.    We discussed a brief life review of the patient, along with his functional and nutritional status. Peter Parsons was able to openly discuss his life. He reports prior to admission he was living in a Group home [employer]). He states both of his parents are living and called them by name. He reports they are in their late 11s and prior to him going to the group home he lived with them. He reports " now they are to old to keep up with me and provide care for me. I stress them out!" I asked how did this make him feel and reports, "ok, they are old. I know that and I don't want them to be sick because of me!" Support given. He reports he has an older sister name Peter Parsons who lives in [place]. He states he is originally from [place] also and that is where he graduated from high school. 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 He states he has never married or had children due to his extensive psychiatric history.   Prior to admission Peter Parsons reports he was able to perform all ADLs independently. He states he is now weak and states "I think my medicines are messed up or I missed taking them!" I used this space and opportunity to explore why he has not been taking his medications for the nursing staff or eating much. He states "the food isn't that good and I am not a morning person. I am a  night owl!" We both laughed. He states he doesn't like to be woke up early it irritates him and requested the nurses wait until after 12 or 1 to bother him. I attempted to explain while in the hospital care and the timing changes based on his needs however we would try to do everything possible to adjust to his comfort needs to gain his cooperation within reason of the care needed. He verbalized understanding and appreciation expressing "sounds fair. It's a deal!"   We discussed his statement of food not tasting good. He reports he loves spaghetti and orange sherbet ice cream. RN also at the bedside and is planning to go get him orange sherbet and make adjustments with dietary to see if he can receive some of these items when they are available. Peter Parsons verbalized his appreciation.   We discussed His current illness and what it means in the larger context of His on-going co-morbidities. With specific discussions regarding his poor po intake, lethargy, refusal of medication, and overall decline in functional and nutritional state. He denied complications with swallowing or choking, although RN reports he has noticeable pocketing or holding of foods at times.   He seemed aware of his illnesses and able to appropriately verbalized his current condition. He reports "I wasn't like this before. I have been here to long and I am almost ready to go!" He ask if we had found a new home for him and somewhere that the staff would treat him like a human and with respect. I explained our Social Work team was working hard to find him a location. He shares to call his guardian Peter Parsons and have her help [place] find him somewhere nice to go! He expressed understanding.   I attempted to elicit values and goals of care important to the patient.    I created space for Peter Parsons to discuss feelings. He stated "I have lived a rough life but it is my life!" Therapeutic listening provided. When asked about his wishes to get better or not,  wishes to live and continue to thrive he closed his eyes. Silence allowed. After a few moments he opened his eyes and stated "that question is debatable. It depends on the day!" I encouraged patient to elaborate on his statement. He stated "If I want to live depends on the day. If I am like this what is there to live for? But like I said living for me is a debatable question!" Therapeutic listening provided. When I asked him why was it debatable and if he did or did not want to live what does that mean or look like for him.  He responded initially with I don't know. He then states "It just is. If I live I live. If I die I die. I won't be mad or sad either way!" When I asked did he want to harm himself he looked at me and stated "I am sure you have seen and read my  history. I am not going to do nothing to myself but if I die or live I won't be sad. No one probably will!" Therapeutic listening provided.   When I asked if he was to die if he would want heroic measures or for the medical team to save him by doing CPR and putting him on machines. He stated "No when I am gone, let me go! Don't bring me back to this hateful world, they don't understand [place]!" When I asked what "[place]" meant he said "me and my life's history or you because you are black in [place]." Support given.   I encouraged Peter Parsons to eat as much as he could and that the medical team would meet him halfway if he did his part we would do our part to make sure he received the care he needed and treat him with respect. He verbalized appreciation. We discussed the nurses administering medications later in the day when he is more awake and getting him some of the foods he enjoy as long as they were safe. He verbalized understanding and appreciation expressing again it was a deal.   He did not have any further questions. Prior to me leaving Peter Parsons asked me was I coming back to see and talk with him some more. I informed him I would stop by and see him  tomorrow for sure. He verbalized appreciation and asked me not to forget. He requested the TV station be changed. He states he likes old sitcoms such as the [employer].    SOCIAL HISTORY:     reports that he has quit smoking. He has never used smokeless tobacco. He reports previous alcohol use. He reports previous drug use.  CODE STATUS: Full code  ADVANCE DIRECTIVES: Peter Parsons (DSS Legal Guardian)    SYMPTOM MANAGEMENT: per attending   Palliative Prophylaxis:   Aspiration, Bowel Regimen, Delirium Protocol and Oral Care  PSYCHO-SOCIAL/SPIRITUAL:  Support System: Family  Desire for further Chaplaincy support:No   Additional Recommendations (Limitations, Scope, Preferences):  Full Scope Treatment   PAST MEDICAL HISTORY: Past Medical History:  Diagnosis Date  . Diabetes mellitus without complication ([place])   . Hypertension   . Schizophrenia ([place])     PAST SURGICAL HISTORY: History reviewed. No pertinent surgical history.  ALLERGIES:  is allergic to acetaminophen.   MEDICATIONS:  Current Facility-Administered Medications  Medication Dose Route Frequency Provider Last Rate Last Admin  . 0.9 %  sodium chloride infusion   Intravenous PRN Lorella Nimrod, MD 10 mL/hr at 05/23/19 0309 250 mL at 05/23/19 0309  . atorvastatin (LIPITOR) tablet 10 mg  10 mg Oral Daily Carrie Mew, MD   10 mg at 06/07/19 1054  . chlorhexidine (PERIDEX) 0.12 % solution 15 mL  15 mL Mouth/Throat BID Lavina Hamman, MD   15 mL at 06/07/19 2109  . Chlorhexidine Gluconate Cloth 2 % PADS 6 each  6 each Topical Daily Dhungel, Nishant, MD   6 each at 06/07/19 1550  . clonazePAM (KLONOPIN) tablet 0.25 mg  0.25 mg Oral TID PRN Dixie Dials, MD   0.25 mg at 05/01/19 1603  . collagenase (SANTYL) ointment   Topical Daily Sidney Ace, MD   Given at 06/07/19 1532  . dextrose 50 % solution 50 mL  1 ampule Intravenous PRN Lavina Hamman, MD      . escitalopram (LEXAPRO) tablet 10 mg  10 mg  Oral Daily Cristofano, Dorene Ar, MD   10 mg at 06/07/19 1052  .  feeding supplement (NEPRO CARB STEADY) liquid 237 mL  237 mL Oral TID BM Enzo Bi, MD 0 mL/hr at 06/04/19 0440 237 mL at 06/08/19 2052  . feeding supplement (PRO-STAT SUGAR FREE 64) liquid 30 mL  30 mL Oral BID Dhungel, Nishant, MD   30 mL at 06/08/19 2229  . gabapentin (NEURONTIN) capsule 300 mg  300 mg Oral TID Carrie Mew, MD   300 mg at 06/08/19 2229  . metoprolol tartrate (LOPRESSOR) injection 5 mg  5 mg Intravenous Q12H PRN Wyvonnia Dusky, MD   5 mg at 05/14/19 1011  . mirtazapine (REMERON) tablet 45 mg  45 mg Oral QHS Clapacs, Madie Reno, MD   45 mg at 06/08/19 2227  . multivitamin with minerals tablet 1 tablet  1 tablet Oral Daily Cristofano, Dorene Ar, MD   1 tablet at 06/07/19 1053  . nystatin cream (MYCOSTATIN)   Topical BID Lorella Nimrod, MD   Given at 06/08/19 2229  . polyethylene glycol (MIRALAX / GLYCOLAX) packet 17 g  17 g Oral BID Enzo Bi, MD   17 g at 06/07/19 2110  . risperiDONE (RISPERDAL) tablet 3 mg  3 mg Oral BID Carrie Mew, MD   3 mg at 06/08/19 2230  . senna-docusate (Senokot-S) tablet 2 tablet  2 tablet Oral BID Lorella Nimrod, MD   2 tablet at 06/08/19 2228  . sodium chloride flush (NS) 0.9 % injection 10-40 mL  10-40 mL Intracatheter Q12H Dhungel, Nishant, MD   10 mL at 06/08/19 2230  . sodium chloride flush (NS) 0.9 % injection 10-40 mL  10-40 mL Intracatheter PRN Dhungel, Nishant, MD      . tamsulosin (FLOMAX) capsule 0.4 mg  0.4 mg Oral Daily Fritzi Mandes, MD   0.4 mg at 06/07/19 1053  . traZODone (DESYREL) tablet 150 mg  150 mg Oral QHS Carrie Mew, MD   150 mg at 06/08/19 2228    VITAL SIGNS: BP (!) 137/92 (BP Location: Right Arm)   Pulse (!) 102   Temp 98.2 F (36.8 C) (Oral)   Resp 20   Ht _0  (1.753 m)   Wt 82.4 kg   SpO2 96%   BMI 26.83 kg/m  Filed Weights   05/18/19 0532 05/25/19 0500 06/01/19 0500  Weight: 81.6 kg 83.5 kg 82.4 kg    Estimated body mass index is  26.83 kg/m as calculated from the following:   Height as of this encounter: _1  (1.753 m).   Weight as of this encounter: 82.4 kg.  LABS: CBC:    Component Value Date/Time   WBC 4.8 06/01/2019 0954   HGB 11.2 (L) 06/01/2019 0954   HCT 32.8 (L) 06/01/2019 0954   HCT 25.0 (L) 05/15/2019 0059   PLT 151 06/01/2019 0954   Comprehensive Metabolic Panel:    Component Value Date/Time   NA 141 06/01/2019 0954   K 4.1 06/01/2019 0954   CO2 30 06/01/2019 0954   BUN 44 (H) 06/01/2019 0954   CREATININE 0.97 06/01/2019 0954   ALBUMIN 3.5 06/01/2019 0954     Review of Systems  Constitutional: Positive for fatigue.  Neurological: Positive for weakness.  Unless otherwise noted, a complete review of systems is negative.  Physical Exam General: NAD, chronically-ill appearing Cardiovascular: regular rate and rhythm Pulmonary: diminished bilaterally Abdomen: soft, nontender, + bowel sounds Extremities: no edema, no joint deformities Neurological: awake, alert, oriented x4, follows commands   Prognosis: Guarded   Discharge Planning:  To Be Determined  Recommendations:  Full  Code-although patient expressed wishes for DNR, given hx of ?underlying dementia and psychiatric hx in setting of legal guardian would need final decisions by [place].   Continue current plan of care per medical team  Patient awake and alert during discussion. He is more of an afternoon/night person. To better support patient and provide opportunity for meaningful signs of improvement/stability will need to adjust timing of care, assessments, and medications to his awake cycle to better have an appropriate idea of his needs and care.   Pharmacy to adjust all available medications to be administered later in day after 2pm to assess for cooperation.   RN to make sure patient is receiving foods that are acceptable to patient to encourage increase in po intake (he likes spaghetti, burgers, and orange sherbet).  Encourage out of bed or PT eval during awake times. Blinds open, bathing, and ADLs. Most likely will not get a response if care is attempted prior to early afternoon.   Watchful waiting and re-evaluation by Psych team to make further recommendations.   Would recommend outpatient palliative support at minimal to provide patient and medical team with ongoing support.   PMT will continue to support and follow as needed.    Palliative Performance Scale: PPS 30%               Patient and nursing staff expressed understanding and was in agreement with this plan.   Thank you for allowing the Palliative Medicine Team to assist in the care of this patient.  Time In: 1600 Time Out: 1705 Time Total: 65 min.   Visit consisted of counseling and education dealing with the complex and emotionally intense issues of symptom management and palliative care in the setting of serious and potentially life-threatening illness.Greater than 50%  of this time was spent counseling and coordinating care related to the above assessment and plan.  Signed by:  Alda Lea, AGPCNP-BC Palliative Medicine Team  Phone: [phone] Fax: [phone] Pager: [phone] Amion: Bjorn Pippin

## 2019-06-09 NOTE — Progress Notes (Signed)
Pt continues to pocket food and refuse to take all PO medications despite several attempts and frequent encouragement. At breakfast time this morning, all food removed from patients mouth as he was pocketing all food right side of cheek. Provider was made aware.

## 2019-06-09 NOTE — Progress Notes (Signed)
[place] Room 118 -- Manufacturing engineer [facility]) Hospital Liaison RN note  Referral received for evaluation for Hospice Home. Case reviewed with the Hospice Home team and at this time the patient is not considered to be appropriate for the Hospice Home.  Please call with any hospice related questions or concerns.  Thank you. Margaretmary Eddy, BSN, RN [facility] Liaison [phone]

## 2019-06-09 NOTE — Plan of Care (Signed)
No changes in current plan of care. Continuing physical and occupational therapy and pursing placement.

## 2019-06-09 NOTE — TOC Progression Note (Signed)
Transition of Care [facility]) - Progression Note    Patient Details  Name: Peter Parsons MRN: [id number] Date of Birth: [birth year]  Transition of Care [facility]) CM/SW Contact  Shelbie Hutching, RN Phone Number: 06/09/2019, 11:22 AM  Clinical Narrative:    Hospice referral given to [facility] with [facility].  Olivia Mackie will evaluate patient to see if he is appropriate for the hospice home.     Expected Discharge Plan: Group Home Barriers to Discharge: SNF Pending bed offer  Expected Discharge Plan and Services Expected Discharge Plan: Group Home   Discharge Planning Services: CM Consult   Living arrangements for the past 2 months: Group Home                 DME Arranged: N/A DME Agency: [employer] Date DME Agency Contacted: 04/12/19 Time DME Agency Contacted: [phone] Representative spoke with at DME Agency: none needed [place] Arranged: NA           Social Determinants of Health ([place]) Interventions    Readmission Risk Interventions Readmission Risk Prevention Plan 04/27/2019 04/20/2019  Transportation Screening Complete Complete  PCP or Specialist Appt within 3-5 Days - Not Complete  Not Complete comments - not ready to [place] or Home Care Consult - Not Complete  HRI or Home Care Consult comments - not ready toDC, looking for a bed for placement  Social Work Consult for [place] Planning/Counseling - [place] - Not Applicable  Medication Review (RN Care Manager) Referral to Pharmacy Referral to Pharmacy  PCP or Specialist appointment within 3-5 days of discharge Complete -  [place] or [place] Complete -  [place] Not Applicable -

## 2019-06-09 NOTE — Progress Notes (Signed)
OT Cancellation Note  Patient Details Name: Peter Parsons MRN: [id number] DOB: [birth year]   Cancelled Treatment:    Reason Eval/Treat Not Completed: Patient declined, no reason specified. OT attempted to see pt for treatment session/re-evaluation this date. Pt resting in bed. Declines to participate in therapy at this time, but agreeable to attempting next date. Pt voices desire to take a shower. Will re-attempt next date as available and pt medically appropriate.   Shara Blazing, M.S., OTR/L Ascom: [phone] 06/09/19, 1:02 PM

## 2019-06-10 MED ORDER — TRAZODONE HCL 50 MG PO TABS
100.0000 mg | ORAL_TABLET | Freq: Every day | ORAL | Status: DC
  Administered 2019-06-10 – 2019-06-16 (×7): 100 mg via ORAL
  Filled 2019-06-10 (×7): qty 2

## 2019-06-10 MED ORDER — ZIPRASIDONE HCL 20 MG PO CAPS
60.0000 mg | ORAL_CAPSULE | Freq: Two times a day (BID) | ORAL | Status: DC
  Administered 2019-06-10 – 2019-06-17 (×14): 60 mg via ORAL
  Filled 2019-06-10 (×16): qty 3

## 2019-06-10 NOTE — Progress Notes (Signed)
Occupational Therapy Treatment Patient Details Name: Peter Parsons MRN: [id number] DOB: 1960/05/01 Today's Date: 06/10/2019    History of present illness 59 y.o. male initially presenting to hospital 02/11/19 with Involuntary commitment from group home d/t wandering off into woods multiple times (found near water); pt reporting suicidal thoughts of drowning himself.  Pt tripped on his own feet and hit chin on chair on morning of 2/4 (pt got back up and walked to restroom independently).  Unable to return to group home d/t safety concerns.  Per chart review pt scored 19/30 on [facility] cognitive assessment with psychiatry indicative of moderate cognitive impairment (consistent with pt's diagnosis of dementia).  Pt was in [place] in ED for about 44 days prior to being admitted to hospital 2/13 for generalized weakness, dysphagia, and slurred speech.  MRI negative for acute CVA.  Hospital stay additionally significant for transfer to CCU due to hypotension, tachycardia associated with sepsis due to UTI.  PMH includes DM, htn, schizophrenia.   OT comments  Pt seen for OT tx session focused on bathing and self-care this date. OT facilitated seated shower, providing overall MOD assist in bathing, MOD assist for LB dressing, and MIN assist for UB bathing, dressing, and grooming tasks.  Bohan presents with slightly improved social participation and initiation, although continues to require significant verbal and tactile cues to initiate most functional tasks.  OT provided MOD assist for functional sit to stand transfers and CGA for ambulation to bed, toilet, and shower.  During shower pt tells this author, "I was supposed to read both sides before we got all this started." Pt re-directed to task at hand, but later in the shower states "you don't understand, that's why I need to commit suicide". Pt again re-directed to functional tasks. This Pryor Curia utilizes therapeutic use of self t/o session to provide emotional support  and encouragement.  Laudie reported feeling better after getting clean in the shower and voiced desire to continue to shower regularly.  Pt continues to benefit from skilled OT services to optimize safety and independence in ADLs. POC reviewed, as pt due for re-evaluation. Pt is progressing toward OT goals, and goals remain appropriate at this time. Discharge recommendation for home health OT with 24 hour supervision remains appropriate.   Follow Up Recommendations  Home health OT;Supervision/Assistance - 24 hour    Equipment Recommendations  3 in 1 bedside commode    Recommendations for Other Services      Precautions / Restrictions Precautions Precautions: Fall Precaution Comments: Suicide precautions; aspiration precautions; monitor HR Restrictions Weight Bearing Restrictions: No       Mobility Bed Mobility Overal bed mobility: Needs Assistance Bed Mobility: Supine to Sit     Supine to sit: Supervision     General bed mobility comments: Pt comes to sitting EOB with supervision for safety. Increased time/effort to perform.  Transfers Overall transfer level: Needs assistance Equipment used: Rolling walker (2 wheeled) Transfers: Sit to/from Stand Sit to Stand: Mod assist;Min assist;Min guard         General transfer comment: Pt requires variable levels of assist during STS this date. MOD A from low commode, however pt is able to perform STS x3 from [facility] set in shower this date.    Balance Overall balance assessment: Needs assistance Sitting-balance support: Feet supported;Single extremity supported Sitting balance-Leahy Scale: Fair Sitting balance - Comments: L lateral lean. Postural control: Posterior lean Standing balance support: Bilateral upper extremity supported;During functional activity Standing balance-Leahy Scale: Fair  ADL either performed or assessed with clinical judgement   ADL Overall ADL's : Needs  assistance/impaired     Grooming: Wash/dry face;Applying deodorant;Brushing hair;Cueing for sequencing;Cueing for safety;Supervision/safety Grooming Details (indicate cue type and reason): Supervision for safety and cueing for sequencing/initiation of grooming tasks. Upper Body Bathing: Moderate assistance;Cueing for safety;Cueing for sequencing Upper Body Bathing Details (indicate cue type and reason): Mod assist needed to reach and wash back, verbal and tactile cues to initiate washing hair, supervision needed for safety t/o task Lower Body Bathing: Moderate assistance;Cueing for safety;Cueing for sequencing Lower Body Bathing Details (indicate cue type and reason): Moderate assist needed to reach feet and lower legs. Verbal and tactile cues needed to initiate washing all parts of body. Pt able to perform STS while using shower grab bar for support. Consistent supervision and cueing for safety required t/o . Upper Body Dressing : Minimal assistance;Sitting;Cueing for safety;Cueing for sequencing Upper Body Dressing Details (indicate cue type and reason): Pt able to assist with UB dressing (gown change) this date. Lower Body Dressing: Moderate assistance;Cueing for safety Lower Body Dressing Details (indicate cue type and reason): Mod assist needed to don/doff socks Toilet Transfer: Regular Toilet;RW;Moderate assistance;Cueing for safety Toilet Transfer Details (indicate cue type and reason): Pt performs toilet transfer using RW this date. He rquires MOD A for STS from room commode and multimodal cueing for safety with functional mobility this date. Toileting- Clothing Manipulation and Hygiene: Minimal assistance;Cueing for safety Toileting - Clothing Manipulation Details (indicate cue type and reason): VCs to safe leaning while seated and holding hand rail as needed. Tub/ Shower Transfer: Nurse, learning disability Details (indicate cue type and reason): Mod verbal cues for safety and  sequencing.  Pt side stepped using RW and grab bars into shower, min guard Functional mobility during ADLs: Moderate assistance;Rolling walker General ADL Comments: MOD A for STS primarily when coming  into standing. Min A to advance walker during functional mobility in room.     Vision Patient Visual Report: No change from baseline     Perception     Praxis      Cognition Arousal/Alertness: Awake/alert Behavior During Therapy: Flat affect Overall Cognitive Status: No family/caregiver present to determine baseline cognitive functioning                                 General Comments: Increased processing time including sequencing, initiation, problem solving        Exercises Other Exercises Other Exercises: OT facilitates functional ADL tasks including toileting, seated shower, dressing, functional mobility, and grooming this date. See ADL section above for additional detail.   Shoulder Instructions       General Comments      Pertinent Vitals/ Pain       Pain Assessment: No/denies pain Faces Pain Scale: No hurt Pain Intervention(s): Monitored during session  Home Living                                          Prior Functioning/Environment              Frequency  Min 1X/week        Progress Toward Goals  OT Goals(current goals can now be found in the care plan section)  Progress towards OT goals: Progressing toward goals  Acute Rehab OT Goals Patient Stated  Goal: To get out of the hospital OT Goal Formulation: With patient Time For Goal Achievement: 06/24/19 Potential to Achieve Goals: Good  Plan Discharge plan remains appropriate;Frequency remains appropriate    Co-evaluation                 AM-PAC OT "6 Clicks" Daily Activity     Outcome Measure   Help from another person eating meals?: None Help from another person taking care of personal grooming?: A Little Help from another person toileting, which  includes using toliet, bedpan, or urinal?: A Little Help from another person bathing (including washing, rinsing, drying)?: A Lot Help from another person to put on and taking off regular upper body clothing?: A Little Help from another person to put on and taking off regular lower body clothing?: A Lot 6 Click Score: 17    End of Session Equipment Utilized During Treatment: Gait belt;Rolling walker  OT Visit Diagnosis: Unsteadiness on feet (R26.81);Muscle weakness (generalized) (M62.81)   Activity Tolerance Patient tolerated treatment well   Patient Left in chair;with call bell/phone within reach;with chair alarm set;Other (comment)(With prevlon boots re-applied)   Nurse Communication Mobility status        Time: 1341-1447 OT Time Calculation (min): 66 min  Charges: OT General Charges $OT Visit: 1 Visit OT Treatments $Self Care/Home Management : 53-67 mins  Shara Blazing, M.S., OTR/L Ascom: [phone] 06/10/19, 4:26 PM

## 2019-06-10 NOTE — Progress Notes (Addendum)
PROGRESS NOTE    Peter Parsons  [id number] DOB: [birth year] DOA: 02/11/2019 PCP: System, Provider Not In   Brief Narrative:  Peter Parsons is a 59 year old male with chronic schizophrenia with frequent suicidal ideations, bipolar disorder with depression, type 2 diabetes mellitus (controlled), CKD stage II, peripheral neuropathy, BPH, hypertension presented with altered mental status. Prolonged hospital stay awaiting placement in a memory care unit. Does not have capacity.  Hospital course/significant events 3/31:Prolonged hospital stay of almost 6 weeks awaiting for placement. Foley discontinued after prolonged use in the hospital (suspected was placed due to bladder outlet obstruction). Intermittent bladder scan showing urinary retention. 4/1:Foley placed in due to urinary retention noted on repeat bladder scans. Renal ultrasound done showed distended bladder with Foley not in the bladder. 4/2:Patient found to have gross hematuria with clots and Foley removed. Urology consulted and a 23 [place] coud catheter placed. Patient became septic with hypotension, fever and tachycardia. Received 5 L normal saline bolus with minimal improvement and transferred to stepdown unit. Started on Neo-Synephrine through peripheral IV and given 1 unit PRBC for acute blood loss anemia. 4/25: Patient seen and examined.  Headlights reported by overnight nurse.  Patient started on permethrin cream.  Still lethargic on my evaluation.  Flattened affect.  Does answer questions upon prompting. 4/27: Patient seen and examined.  Examined scalp with bedside RN did not reveal any active lice.  Suspect dandruff.  Patient still very lethargic and flattened today.  Does respond upon repeat prompting.  Vital signs stable.  I discussed with case manager the possibility of a hospice evaluation.  She will reach out to her with her care representative. 4/28: Patient himself is sleeping and difficult to arouse when I first  go and however when I reintroduced myself and tell him it is nice to see him again he opens his eyes and looks at me.   4/29: Saw patient this afternoon, he is much more communicative.  He is appreciative of conversations with psychiatry as well as with palliative care.  He wants to take a shower.   Physical Exam: Blood pressure 106/82, pulse 63, temperature 98.6 F (37 C), resp. rate 17, height 5\' 9"  (1.753 m), weight 82.4 kg, SpO2 95 %. Gen: Patient is more awake, is slow to answer and somewhat stiff but does answer appropriately.   CVS: S1-S2, regulary, no gallops Respiratory:  decreased air entry likely secondary to decreased inspiratory effort GI: NABS, soft, NT  LE: No edema. No cyanosis   Assessment & Plan:   Principal Problem:   Enterococcal bacteremia Active Problems:   Paranoid schizophrenia ([place])   Altered mental status   Dementia (HCC)   Weakness   Acute metabolic encephalopathy   Protein-calorie malnutrition, severe   Pressure injury of skin   Hypertension   Type 2 diabetes mellitus (HCC)   Dyslipidemia   BPH (benign prostatic hyperplasia)   Normocytic anemia   Thrombocytopenia (HCC)   Acute urinary retention   Urethral trauma   UTI (urinary tract infection)   Active problems:  Failure to thrive Patient has had significant worsening in his functional status since he has been here for the past 73 days. His p.o. intake has decreased, his mood has worsened. He was seen by palliative care and psychiatry yesterday and he was much more communicative and hopeful with them.   Patient would benefit from increased interaction and stimulus in the environment. Psychiatry will attempt to decrease oversedation in the morning so that he is more interactive.  Chronic  schizophrenia and Depression with bipolar disorder Appreciate Dr. Satira Sark coming by to see patient and his recommendations. Goal is to decrease morning somnolence as well as to decrease stiffness, increased  tone. Medications have been changed as follows: Risperidone discontinued, Ziprasidone (Geodon) 60 mg twice a day started. Trazodone has been decreased to 100 mg at bedtime to decrease AM oversedation. Remeron has been discontinued also  to decrease AM oversedation. Clonazepam and Escitalopram have been continued. Dr. Satira Sark will come back and see the patient in 3 days to assess effects of med changes. Will order EKG to follow QTC.  Head lice I examined patient myself and did not see any evidence of lice or eggs. Patient is already been treated with permethrin cream 5%.  Pressure injury, stage III, on coccyx and bilateral heels not POA Full-thickness tissue loss, being followed by wound care which is appreciated. Patient has remained afebrile with no leukocytosis  BPH with urinary retention, now with chronic Foley Foley was placed by urology after having a traumatic urethral injury  Failed voiding trail, Coude Foley re-inserted on 4/13. Failed voiding trial 4/20 again Patient was able to void on his own on 4/22. Foley discontinued.  Constipation. Miralax BID  Fungal rash on his face after shaving of beard.  Try keeping that area dry. Nystatin cream  Peripheral neuropathy Continue gabapentin  Severe protein calorie malnutrition Continue nutrition supplement Remeron had been started to induce appetite however that was discontinued as noted above. Patient has had decreased p.o. intake over the past several days.    Previous problems, now resolved: Severe sepsis with septic shock.  Resolved. Secondary to catheter associated UTI. Patient had multiple straight caths done past few days and Foley placed in on 4/1. Transferred to stepdown unit and multiple IV normal saline bolus received. Required Neo-Synephrine overnight and now stable off pressors. Sepsis resolved. Blood and urine culture growing Enterococcus faecalis. Antibiotic narrowed to ampicillin -completed  course.  Hypokalemia/hypomagnesemia, Resolved. Replete electrolytes as needed and monitor.  Gross hematuria, resolved Secondary to traumatic catheterization.Foley removed and urology placed in a 20 [place] coud Foley without resistance. Recommend intermittent irrigation, keep Foley upon discharge and follow-up with urology in [place]. Urine now clear. Received 1 unit PRBC for drop in H&H.  AKI, resolved chronic kidney disease stage II Likely ATN with sepsis + bladder outlet obstruction.   Type 2 diabetes mellitus, controlled A1c of 4.5. CBG stable.  Thrombocytopenia, resolved Unclear etiology. Possibly from acute illness.    Hypertension/ hyperlipidemia.  BP within goal. Off all blood pressure meds. Continue statin.  Dysphagia/dysarthria and left facial droop MRI negative for CVA. Continue dysphagia diet   DVT prophylaxis: SCD Code Status: Full Family Communication: None Disposition Plan:   Patient is from: [place]  Anticipated Discharge Location: Memory care unit  Barriers to Discharge: Patient has now been here for 73 days, presumably barrier to discharge is finding an appropriate memory care unit.  Is patient medically stable for Discharge: Yes although he is functionally declining over the course of his stay. Patient was seen by psychiatry today with significant medication changes.  I hope patient may be able to go back to a group home if he is able to regain mental functioning.   Consultants:   Psychiatry  Urology  Palliative care  Procedures:   Foley catheterization  Antimicrobials:   none   Objective: Vitals:   06/08/19 1607 06/09/19 0005 06/09/19 1730 06/10/19 0810  BP: 137/85 (!) 137/92 134/82 106/82  Pulse: 91 (!) 102  99 63  Resp: 18 20 20 17   Temp: 98.6 F (37 C) 98.2 F (36.8 C) 98.7 F (37.1 C) 98.6 F (37 C)  TempSrc:  Oral Oral   SpO2: 97% 96% 96% 95%  Weight:      Height:        Intake/Output Summary (Last  24 hours) at 06/10/2019 1437 Last data filed at 06/10/2019 1055 Gross per 24 hour  Intake 10 ml  Output 1600 ml  Net -1590 ml   Filed Weights   05/18/19 0532 05/25/19 0500 06/01/19 0500  Weight: 81.6 kg 83.5 kg 82.4 kg      Data Reviewed: I have personally reviewed following labs and imaging studies  CBC: No results for input(s): WBC, NEUTROABS, HGB, HCT, MCV, PLT in the last 168 hours. Basic Metabolic Panel: No results for input(s): NA, K, CL, CO2, GLUCOSE, BUN, CREATININE, CALCIUM, MG, PHOS in the last 168 hours. GFR: Estimated Creatinine Clearance: 80 mL/min (by C-G formula based on SCr of 0.97 mg/dL). Liver Function Tests: No results for input(s): AST, ALT, ALKPHOS, BILITOT, PROT, ALBUMIN in the last 168 hours. No results for input(s): LIPASE, AMYLASE in the last 168 hours. No results for input(s): AMMONIA in the last 168 hours. Coagulation Profile: No results for input(s): INR, PROTIME in the last 168 hours. Cardiac Enzymes: No results for input(s): CKTOTAL, CKMB, CKMBINDEX, TROPONINI in the last 168 hours. BNP (last 3 results) No results for input(s): PROBNP in the last 8760 hours. HbA1C: No results for input(s): HGBA1C in the last 72 hours. CBG: Recent Labs  Lab 06/07/19 0751  GLUCAP 89   Lipid Profile: No results for input(s): CHOL, HDL, LDLCALC, TRIG, CHOLHDL, LDLDIRECT in the last 72 hours. Thyroid Function Tests: No results for input(s): TSH, T4TOTAL, FREET4, T3FREE, THYROIDAB in the last 72 hours. Anemia Panel: No results for input(s): VITAMINB12, FOLATE, FERRITIN, TIBC, IRON, RETICCTPCT in the last 72 hours. Sepsis Labs: No results for input(s): PROCALCITON, LATICACIDVEN in the last 168 hours.  No results found for this or any previous visit (from the past 240 hour(s)).       Radiology Studies: No results found.      Scheduled Meds: . atorvastatin  10 mg Oral Daily  . chlorhexidine  15 mL Mouth/Throat BID  . Chlorhexidine Gluconate Cloth  6  each Topical Daily  . collagenase   Topical Daily  . escitalopram  10 mg Oral Daily  . feeding supplement (NEPRO CARB STEADY)  237 mL Oral TID BM  . feeding supplement (PRO-STAT SUGAR FREE 64)  30 mL Oral BID  . gabapentin  300 mg Oral TID  . multivitamin with minerals  1 tablet Oral Daily  . nystatin cream   Topical BID  . polyethylene glycol  17 g Oral BID  . senna-docusate  2 tablet Oral BID  . sodium chloride flush  10-40 mL Intracatheter Q12H  . tamsulosin  0.4 mg Oral Daily  . traZODone  100 mg Oral QHS  . ziprasidone  60 mg Oral BID WC   Continuous Infusions: . sodium chloride 250 mL (05/23/19 0309)     LOS: 73 days    Time spent: 25 min    Vashti Hey, MD [facility] Pager [phone]  If 7PM-7AM, please contact night-coverage 06/10/2019, 2:37 PM

## 2019-06-10 NOTE — TOC Progression Note (Signed)
Transition of Care [facility]) - Progression Note    Patient Details  Name: Peter Parsons MRN: [id number] Date of Birth: Sep 14, 1960  Transition of [facility] CM/SW Contact  Shelbie Hutching, RN Phone Number: 06/10/2019, 4:00 PM  Clinical Narrative:    Psychiatry came up to see patient and will make some adjustments to his medications.  Goal is for the patient to become more alert and interactive and participate in his care so a group home can be found for him to discharge to.   Expected Discharge Plan: Group Home Barriers to Discharge: SNF Pending bed offer  Expected Discharge Plan and Services Expected Discharge Plan: Group Home   Discharge Planning Services: CM Consult   Living arrangements for the past 2 months: Group Home                 DME Arranged: N/A DME Agency: [employer] Date DME Agency Contacted: 04/12/19 Time DME Agency Contacted: [phone] Representative spoke with at DME Agency: none needed [place] Arranged: NA           Social Determinants of Health ([place]) Interventions    Readmission Risk Interventions Readmission Risk Prevention Plan 04/27/2019 04/20/2019  Transportation Screening Complete Complete  PCP or Specialist Appt within 3-5 Days - Not Complete  Not Complete comments - not ready to [place] or Home Care Consult - Not Complete  HRI or Home Care Consult comments - not ready toDC, looking for a bed for placement  Social Work Consult for [place] Planning/Counseling - [place] - Not Applicable  Medication Review (RN Care Manager) Referral to Pharmacy Referral to Pharmacy  PCP or Specialist appointment within 3-5 days of discharge Complete -  [place] or [place] Complete -  [place] Not Applicable -

## 2019-06-10 NOTE — Consult Note (Addendum)
[facility] Consult   Reason for Consult:  Depression Referring Physician:  Floor MD Patient Identification: Peter Parsons MRN:  [id number] Principal Diagnosis: Enterococcal bacteremia Diagnosis:  Principal Problem:   Enterococcal bacteremia Active Problems:   Paranoid schizophrenia ([place])   Altered mental status   Dementia ([place])   Weakness   Acute metabolic encephalopathy   Protein-calorie malnutrition, severe   Pressure injury of skin   Hypertension   Type 2 diabetes mellitus ([place])   Dyslipidemia   BPH (benign prostatic hyperplasia)   Normocytic anemia   Thrombocytopenia ([facility])   Acute urinary retention   Urethral trauma   UTI (urinary tract infection)   Total Time spent with patient: 20 minutes  Subjective:   Peter Parsons is a 59 y.o. male patient admitted with need to evaluate depression  My assessment nears the very complete and helpful evaluation from the palliative care consultant.  As seen by them the patient is initially quite distant and unwilling to interact however with additional effort he is much more engaged.  I also spoke with nursing as well as his medical doctor and they noted that the mornings are especially difficult for him in terms of energy and weakness.  As the day progresses apparently he has more energy and is much more animated.  I reviewed his medications and it is possible that some of his medications are responsible for this altered energy levels during the day.  I have recommended changes which I will identify below.  I also noted that he tends to be very stiff and I asked him about his current experience with risperidone to clarify if this medication potentially is associated with his stiffness.  I change in his antipsychotic medication is also warranted given his long stay in the hospital thus far and the lack of improvement in his symptoms.  The patient clearly has frustration with his current medical condition but I see no evidence  that he is quitting or giving up.  It will be helpful to assess him with a change in his medications.  HPI: per summary by Palliative Care Palliative Care consult requested for goals of care discussion in this 59 y.o. male with multiple medical problems including type 2 diabetes mellitus, hypertension, and schizophrenia. Per notations prior to admission patient was in the ED [facility] ([facility]) for 44 days (01/25/2019) due to not acting like himself and increased weakness. Pt presented to ED for medical work-up on 03/28/19 due to complaints of generalized weakness and dysphagia. He was also noted to have a left facial droop. During work-up Chest x-rays showed no acute abnormalities. CT of head showed mild right temporal scalp swelling, with no other acute abnormalities. Since his admission patient has been awaiting placement in a memory care unit. He continues to have episodes of lethargy, refusing oral nutrition and medications at times.    Past Psychiatric History: 2  Risk to Self: Suicidal Ideation: Yes-Currently Present Suicidal Intent: No Is patient at risk for suicide?: No, but patient needs Medical Clearance Suicidal Plan?: Yes-Currently Present Specify Current Suicidal Plan: Drown his self Access to Means: No Specify Access to Suicidal Means: Drown self What has been your use of drugs/alcohol within the last 12 months?: Reports of none How many times?: 1 Other Self Harm Risks: Reports of none Triggers for Past Attempts: Other (Comment)(Mental Illness) Intentional Self Injurious Behavior: None Risk to Others: Homicidal Ideation: No Thoughts of Harm to Others: No Current Homicidal Intent: No Current Homicidal Plan: No Access to Homicidal  Means: No Identified Victim: Reports of none History of harm to others?: No Assessment of Violence: None Noted Violent Behavior Description: Reports of none Does patient have access to weapons?: No Criminal Charges Pending?: No Describe Pending Criminal  Charges: Reports of none Does patient have a court date: No Prior Inpatient Therapy: Prior Inpatient Therapy: Yes Prior Therapy Dates: 01/2019, 12/2018 & 05/2018 Prior Therapy Facilty/Provider(s): [facility] Reason for Treatment: Schizophrenia Prior Outpatient Therapy: Prior Outpatient Therapy: Yes Prior Therapy Dates: Current Prior Therapy Facilty/Provider(s): [facility] Reason for Treatment: Schizophrenia([facility]) Does patient have an ACCT team?: Yes Does patient have Intensive In-House Services?  : No Does patient have [facility] services? : No Does patient have P4CC services?: No  Past Medical History:  Past Medical History:  Diagnosis Date  . Diabetes mellitus without complication ([place])   . Hypertension   . Schizophrenia ([place])    History reviewed. No pertinent surgical history. Family History: History reviewed. No pertinent family history. Family Psychiatric  History:  Social History:  Social History   Substance and Sexual Activity  Alcohol Use Not Currently     Social History   Substance and Sexual Activity  Drug Use Not Currently    Social History   Socioeconomic History  . Marital status: Single    Spouse name: Not on file  . Number of children: Not on file  . Years of education: Not on file  . Highest education level: Not on file  Occupational History  . Not on file  Tobacco Use  . Smoking status: Former Research scientist (life sciences)  . Smokeless tobacco: Never Used  Substance and Sexual Activity  . Alcohol use: Not Currently  . Drug use: Not Currently  . Sexual activity: Not on file  Other Topics Concern  . Not on file  Social History Narrative  . Not on file   Social Determinants of Health   Financial Resource Strain:   . Difficulty of Paying Living Expenses:   Food Insecurity:   . Worried About Charity fundraiser in the Last Year:   . Arboriculturist in the Last Year:   Transportation Needs:   .  Film/video editor (Medical):   Marland Kitchen Lack of Transportation (Non-Medical):   Physical Activity:   . Days of Exercise per Week:   . Minutes of Exercise per Session:   Stress:   . Feeling of Stress :   Social Connections:   . Frequency of Communication with Friends and Family:   . Frequency of Social Gatherings with Friends and Family:   . Attends Religious Services:   . Active Member of Clubs or Organizations:   . Attends Archivist Meetings:   Marland Kitchen Marital Status:    Additional Social History:    Allergies:   Allergies  Allergen Reactions  . Acetaminophen Other (See Comments)    Unknown Unable to breathe Chest  tightness/SOB     Labs: No results found for this or any previous visit (from the past 48 hour(s)).  Current Facility-Administered Medications  Medication Dose Route Frequency Provider Last Rate Last Admin  . 0.9 %  sodium chloride infusion   Intravenous PRN Lorella Nimrod, MD 10 mL/hr at 05/23/19 0309 250 mL at 05/23/19 0309  . atorvastatin (LIPITOR) tablet 10 mg  10 mg Oral Daily Carrie Mew, MD   10 mg at 06/07/19 1054  . chlorhexidine (PERIDEX) 0.12 % solution 15 mL  15 mL Mouth/Throat BID Posey Pronto,  Josetta Huddle, MD   15 mL at 06/09/19 2122  . Chlorhexidine Gluconate Cloth 2 % PADS 6 each  6 each Topical Daily Dhungel, Nishant, MD   6 each at 06/07/19 1550  . clonazePAM (KLONOPIN) tablet 0.25 mg  0.25 mg Oral TID PRN Dixie Dials, MD   0.25 mg at 05/01/19 1603  . collagenase (SANTYL) ointment   Topical Daily Sidney Ace, MD   Given at 06/07/19 1532  . dextrose 50 % solution 50 mL  1 ampule Intravenous PRN Lavina Hamman, MD      . escitalopram (LEXAPRO) tablet 10 mg  10 mg Oral Daily Cristofano, Dorene Ar, MD   10 mg at 06/07/19 1052  . feeding supplement (NEPRO CARB STEADY) liquid 237 mL  237 mL Oral TID BM Enzo Bi, MD   Stopped at 06/09/19 2130  . feeding supplement (PRO-STAT SUGAR FREE 64) liquid 30 mL  30 mL Oral BID Dhungel, Nishant, MD    30 mL at 06/09/19 2122  . gabapentin (NEURONTIN) capsule 300 mg  300 mg Oral TID Carrie Mew, MD   300 mg at 06/09/19 2121  . metoprolol tartrate (LOPRESSOR) injection 5 mg  5 mg Intravenous Q12H PRN Wyvonnia Dusky, MD   5 mg at 05/14/19 1011  . multivitamin with minerals tablet 1 tablet  1 tablet Oral Daily Cristofano, Dorene Ar, MD   1 tablet at 06/07/19 1053  . nystatin cream (MYCOSTATIN)   Topical BID Lorella Nimrod, MD   Given at 06/09/19 2125  . polyethylene glycol (MIRALAX / GLYCOLAX) packet 17 g  17 g Oral BID Enzo Bi, MD   17 g at 06/09/19 2122  . senna-docusate (Senokot-S) tablet 2 tablet  2 tablet Oral BID Lorella Nimrod, MD   2 tablet at 06/09/19 2121  . sodium chloride flush (NS) 0.9 % injection 10-40 mL  10-40 mL Intracatheter Q12H Dhungel, Nishant, MD   10 mL at 06/10/19 1055  . sodium chloride flush (NS) 0.9 % injection 10-40 mL  10-40 mL Intracatheter PRN Dhungel, Nishant, MD      . tamsulosin (FLOMAX) capsule 0.4 mg  0.4 mg Oral Daily Fritzi Mandes, MD   0.4 mg at 06/07/19 1053  . traZODone (DESYREL) tablet 100 mg  100 mg Oral QHS Alesia Morin, MD      . ziprasidone (GEODON) capsule 60 mg  60 mg Oral BID WC Alesia Morin, MD        Psychiatric Specialty Exam: Physical Exam  Review of Systems  Blood pressure 106/82, pulse 63, temperature 98.6 F (37 C), resp. rate 17, height 5\' 9"  (1.753 m), weight 82.4 kg, SpO2 95 %.Body mass index is 26.83 kg/m.  General Appearance: Disheveled  Eye Contact:  Poor  Speech:  Garbled and Slow  Volume:  Decreased  Mood:  Did not answer  Affect:  Depressed  Thought Process:  Linear  Orientation:  Full (Time, Place, and Person)  Thought Content:  Logical  Suicidal Thoughts:  No  Homicidal Thoughts:  No  Memory:  Immediate;   Fair  Judgement:  Negative  Insight:  Negative  Psychomotor Activity:  Decreased  Concentration:  Concentration: Poor  Recall:  Negative  Fund of Knowledge:  Negative  Language:  Negative  Akathisia:   No  Handed:  Ambidextrous  AIMS (if indicated):     Assets:  Others:  None noted  ADL's:  Impaired  Cognition:  Impaired,  Moderate  Sleep:      The patient's  somnolence interferes with a full evaluation of his mental status.  Treatment Plan Summary: The patient is receiving mirtazapine at night as well as S-Citalopram in the morning.  I recommended discontinuing mirtazapine as it may be one source of the oversedation that he is seeing in the morning.  He also receives 150 mg of trazodone at night and I recommended first of all decreasing that to 100 mg at night [the pre-addendum had a dictation transcription error].  This should help his sleep return to a more normal architecture.  There may be some rebound effects to those changes but I feel that in the long-term a return to a more normal sleep architecture will help him be more alert during the day and have a more restful sleep.  He received paliperidone long-acting IM as an outpatient.  Given the length of stay this has certainly washed out at this point.  In the hospital he has been receiving risperidone 3 mg twice a day.  I am concerned that the risperidone is leading to stiffness which I perceive and the patient also notes.  We also can see that it has not been terribly effective.  I discussed with his team discontinuing the risperidone and instead starting ziprasidone 60 mg twice a day.  The trazodone can be increased up to 80 mg twice a day.  As I discussed with the team the only concern is possible effect on the QT interval which should be followed over time.  Disposition: The team has been working towards finding alternative arrangements for the patient with little success thus far.  My hope is that with some improvements in his psychiatric status once he is medically cleared he will be able to be discharged to outpatient care   I will continue to follow the patient over the next few days so that we can determine if the above has a  beneficial effect.  Alesia Morin, MD 06/10/2019 1:32 PM

## 2019-06-11 NOTE — Consult Note (Signed)
Nursing reported some mild benefit in terms of wakefulness due to the decrease in the sedating medications at night.  I appreciate any notes over the weekend so that we can continue to measure improvement in changes giving the fairly substantial changes in his psychiatric medications.  I expect that on Monday we may need to make further changes and as much data as possible will be helpful.  My target symptoms are a more normal sleep-wake cycle with less tiredness in the morning and more energy during the day but obviously not excessive amounts of energy.  Any change in terms of mental clarity is of course a significant goal and as we see improvements it will be helpful to provide opportunities for him to demonstrate higher and higher function.

## 2019-06-11 NOTE — Progress Notes (Signed)
Attempted to ambulate patient. Pt able to stand with significant effort. Had patient march in place using walker for 6 steps and patient unable to complete task before losing his balance and having to return to bed. HOB flattened in order to reduce pressure on sacral ulcer and patient turned side to side per orders.

## 2019-06-11 NOTE — Progress Notes (Signed)
Daily Progress Note   Patient Name: Peter Parsons       Date: 06/11/2019 DOB: 1960-12-03  Age: 59 y.o. MRN#: [id number] Attending Physician: Karie Kirks, DO Primary Care Physician: System, Provider Not In Admit Date: 02/11/2019  Reason for Consultation/Follow-up: Establishing goals of care  Subjective: Patient is awake, alert and oriented x3. Watching tv and eating his lunch lunch tray. Feeding himself without difficulty. Has eaten most of his orange sherbet, yogurt, and chocolate pudding. He acknowledges me when I enter and starts laughing stating, "you made sure I got orange sherbet. They just keep giving it to me even for breakfast!" He is very appreciative. Denies pain or discomfort. Asking if we have found a home for him.   I have attempted on multiple occassions to speak with Peter Parsons guardian but have not made a connection with her. Patient continues to be interactive and alert during my visits. Answers questions appropriately and engages in conversation. He states "I am taking my medications did they tell you!" I acknowledge he has taken medications over the past 24 hours.   Continued support and patience needed with patient as he has been awake, alert, and pleasant. He is aware I am not on service this weekend and will follow back up with him on Monday. He again is appreciative of the support and care.    Length of Stay: 74  Vital Signs: BP 103/73 (BP Location: Right Arm)   Pulse 60   Temp 98.8 F (37.1 C)   Resp 18   Ht 5\' 9"  (1.753 m)   Wt 82.4 kg   SpO2 95%   BMI 26.83 kg/m  SpO2: SpO2: 95 % O2 Device: O2 Device: Room Air O2 Flow Rate: O2 Flow Rate (L/min): 18 L/min   Palliative Care Assessment & Plan   Recommendations/Plan:  Continue current plan of care per medical  team  Peter Parsons continues to show improvement, awake, alert, and cooperation with medication and po intake. Hopefully he will be placed soon in a location that can provide the support and care that he needs.   Discharge Planning:  To Be Determined  Care plan was discussed with RN.   Thank you for allowing the Palliative Medicine Team to assist in the care of this patient.  Time Total: 25 min.   Visit consisted of  counseling and education dealing with the complex and emotionally intense issues of symptom management and palliative care in the setting of serious and potentially life-threatening illness.Greater than 50%  of this time was spent counseling and coordinating care related to the above assessment and plan.  Alda Lea, AGPCNP-BC  Palliative Medicine Team [phone]   Please contact Palliative Medicine Team phone at [phone] for questions and concerns.

## 2019-06-11 NOTE — Progress Notes (Signed)
PROGRESS NOTE  Peter Parsons [id number] DOB: 10-25-1960 DOA: 02/11/2019 PCP: System, Provider Not In  Brief History   Peter Parsons a 59 year old male with chronic schizophrenia with frequent suicidal ideations, bipolar disorder with depression, type 2 diabetes mellitus (controlled), CKD stage II, peripheral neuropathy, BPH, hypertension presented with altered mental status. Prolonged hospital stay awaiting placement in a memory care unit. Does not have capacity.  Foley catheter has been placed, removed and replaced and for recurrent urinary retention. Urology has recommended leaving catheter in place at discharge. He has had sepsis due to CAUTI. Treated with pressors and ampicillin for enterococcus faecalis UTI and bacteremia. He has completed the course. There was gross hematuria associated with UTI which resulted in acute blood loss anemia that was symptomatic with hypotension. The patient received 1 unit of PRBC's in transfusion and was transferred to a stepdown unit. He was found to have head lice on [id number] and was treated with permethrin cream.  The patient has been lethargic and with a flat affect. Hospice has been consulted. He has been more communicative in the last couple of days.  Consultants  . Psychiatry . Palliative care . Wound care . Infectious Disease . Urology  Procedures  . Transfusion  Antibiotics   Anti-infectives (From admission, onward)   Start     Dose/Rate Route Frequency Ordered Stop   05/16/19 0800  ampicillin (OMNIPEN) 2 g in sodium chloride 0.9 % 100 mL IVPB     2 g 300 mL/hr over 20 Minutes Intravenous Every 4 hours 05/15/19 1244 05/24/19 2059   05/15/19 2200  ceFEPIme (MAXIPIME) 2 g in sodium chloride 0.9 % 100 mL IVPB  Status:  Discontinued     2 g 200 mL/hr over 30 Minutes Intravenous Every 12 hours 05/15/19 0724 05/15/19 1115   05/15/19 2200  ampicillin (OMNIPEN) 2 g in sodium chloride 0.9 % 100 mL IVPB  Status:  Discontinued     2 g 300  mL/hr over 20 Minutes Intravenous Every 8 hours 05/15/19 1115 05/15/19 1244   05/15/19 1500  vancomycin (VANCOREADY) IVPB 1750 mg/350 mL  Status:  Discontinued     1,750 mg 175 mL/hr over 120 Minutes Intravenous Every 24 hours 05/14/19 1340 05/15/19 0737   05/15/19 1000  vancomycin (VANCOREADY) IVPB 1250 mg/250 mL  Status:  Discontinued     1,250 mg 166.7 mL/hr over 90 Minutes Intravenous Every 24 hours 05/15/19 0737 05/15/19 1115   05/14/19 1400  ceFEPIme (MAXIPIME) 2 g in sodium chloride 0.9 % 100 mL IVPB  Status:  Discontinued     2 g 200 mL/hr over 30 Minutes Intravenous Every 8 hours 05/14/19 1339 05/15/19 0724   05/14/19 1400  vancomycin (VANCOCIN) 2,000 mg in sodium chloride 0.9 % 500 mL IVPB     2,000 mg 250 mL/hr over 120 Minutes Intravenous  Once 05/14/19 1339 05/14/19 1709    .  Subjective  The patient is resting comfortably. No new complaints.   Objective   Vitals:  Vitals:   06/10/19 2024 06/11/19 0805  BP: (!) 122/94 103/73  Pulse: (!) 105 60  Resp: 19 18  Temp: 98.1 F (36.7 C) 98.8 F (37.1 C)  SpO2: 97% 95%   Exam:  Constitutional:  . The patient is somnolent but rouseable. No acute distress. Respiratory:  . No increased work of breathing. . No wheezes, rales, or rhonchi . No tactile fremitus Cardiovascular:  . Regular rate and rhythm . No murmurs, ectopy, or gallups. . No lateral PMI. No thrills.  Abdomen:  . Abdomen is soft, non-tender, non-distended . No hernias, masses, or organomegaly . Normoactive bowel sounds.  Musculoskeletal:  . No cyanosis, clubbing, or edema Skin:  . No rashes, lesions, ulcers . palpation of skin: no induration or nodules Neurologic:  . CN 2-12 intact . Sensation all 4 extremities intact Psychiatric:  . Mental status o Mood, affect appropriate o Orientation to person, place, time  . judgment and insight appear intact  I have personally reviewed the following:   Today's Data  . Orthoptist  . No  growth surveillance cultures from 05/15/2019.  Scheduled Meds: . atorvastatin  10 mg Oral Daily  . chlorhexidine  15 mL Mouth/Throat BID  . Chlorhexidine Gluconate Cloth  6 each Topical Daily  . collagenase   Topical Daily  . escitalopram  10 mg Oral Daily  . feeding supplement (NEPRO CARB STEADY)  237 mL Oral TID BM  . feeding supplement (PRO-STAT SUGAR FREE 64)  30 mL Oral BID  . gabapentin  300 mg Oral TID  . multivitamin with minerals  1 tablet Oral Daily  . nystatin cream   Topical BID  . polyethylene glycol  17 g Oral BID  . senna-docusate  2 tablet Oral BID  . sodium chloride flush  10-40 mL Intracatheter Q12H  . tamsulosin  0.4 mg Oral Daily  . traZODone  100 mg Oral QHS  . ziprasidone  60 mg Oral BID WC   Continuous Infusions: . sodium chloride 250 mL (05/23/19 0309)    Principal Problem:   Enterococcal bacteremia Active Problems:   Paranoid schizophrenia (HCC)   Altered mental status   Dementia (HCC)   Weakness   Acute metabolic encephalopathy   Protein-calorie malnutrition, severe   Pressure injury of skin   Hypertension   Type 2 diabetes mellitus (HCC)   Dyslipidemia   BPH (benign prostatic hyperplasia)   Normocytic anemia   Thrombocytopenia (HCC)   Acute urinary retention   Urethral trauma   UTI (urinary tract infection)   LOS: 74 days   A & P  Failure to thrive: Patient has had significant worsening in his functional status since he has been here for the past 73 days. His p.o. intake has decreased, his mood has worsened. He was seen by palliative care and psychiatry yesterday and he was much more communicative and hopeful with them. Patient would benefit from increased interaction and stimulus in the environment. Psychiatry has decreased meds so that patient would be less oversedated this morning so that he is more interactive.  Chronic schizophrenia and depression with bipolar disorder: Appreciate Dr. Satira Sark coming by to see patient and his  recommendations. Goal is to decrease morning somnolence as well as to decrease stiffness, increased tone. Medications have been changed as follows: Risperidone discontinued, Ziprasidone (Geodon) 60 mg twice a day started. Trazodone has been decreased to 100 mg at bedtime to decrease AM oversedation. Remeron has been discontinued also  to decrease AM oversedation. Clonazepam and Escitalopram have been continued. Dr. Satira Sark will come back and see the patient in 3 days to assess effects of med changes. Will order EKG to follow QTC.  Head lice: Pt has been treated with permethrin cream 5%. Continue contact precautions.  Pressure injury, stage III, on coccyx and bilateral heels not POA:  Full-thickness tissue loss, being followed by wound care which is appreciated. Patient has remained afebrile with no leukocytosis.  BPH with urinary retention, now with chronic Foley:  Foley was placed by urology  after having a traumatic urethral injury. Failed voiding trail, Coude Foley re-inserted on 4/13. Failed voiding trial 4/20 again. Patient was able to void on his own on 4/22. Foley discontinued. Follow up with urology as outpatient.   Constipation: One unmeasured stool yesterday. Continue miralax bid.   Fungal rash on his face after shaving of beard: Nystatin cream. Keep area clean.  Peripheral neuropathy: Continue gabapentin 300 mg tid as at home.  Severe protein calorie malnutrition: Continue nutrition supplement. Remeron had been started to induce appetite however that was discontinued in order to improve dautime wakefulness. Patient has had decreased p.o. intake over the past several days.  Severe sepsis with septic shock. Resolved. Secondary to catheter associated UTI. Patient had multiple straight caths done past few days and Foley placed in on 4/1. Transferred to stepdown unit and multiple IV normal saline bolus received. Required Neo-Synephrine overnight and now stable off pressors. Sepsis  resolved. Blood and urine culture growing Enterococcus faecalis. Antibiotic narrowed to ampicillin. The patient has completed his course and has had no growth from surveillance cultures.   Hypokalemia/hypomagnesemia: Resolved. Monitor intermittently.  Gross hematuria: Resolved. Due to traumatic catheterization.   AKI on Chronic kidney disease stage II: Resolved. Continue to monitor creatinine, electrolytes, and volume status. Likely due to ATN with sepsis + bladder outlet obstruction.  Type 2 diabetes mellitus: Controlled. HbA1c of 4.5. CBG stable.  Thrombocytopenia: Resolved.Possibly from acute illness.  Essential hypertension: Blood pressures are well controlled on prn lopressor.   Hyperlipidemia: Continue statin as at home.  Dysphagia/dysarthria and left facial droop: MRI negative for CVA. Continue dysphagia diet  DVT prophylaxis: SCD Code Status: Full Family Communication: None Disposition Plan: The patient is from a [place]. It is anticipated that he will be discharged to a memory care unit. Barriers to discharge including difficulty with placement. The patient is medically cleared for discharge.   Peter Peppel, DO [facility] Direct contact: see [email or web address]  7PM-7AM contact night coverage as above 06/11/2019, 11:50 AM  LOS: 74 days

## 2019-06-12 LAB — BASIC METABOLIC PANEL
Anion gap: 11 (ref 5–15)
BUN: 48 mg/dL — ABNORMAL HIGH (ref 8–23)
CO2: 31 mmol/L (ref 22–32)
Calcium: 9.4 mg/dL (ref 8.9–10.3)
Chloride: 99 mmol/L (ref 98–111)
Creatinine, Ser: 1.07 mg/dL (ref 0.61–1.24)
GFR calc Af Amer: >60 mL/min (ref >60)
GFR calc non Af Amer: >60 mL/min (ref >60)
Glucose, Bld: 95 mg/dL (ref 70–99)
Potassium: 3.3 mmol/L — ABNORMAL LOW (ref 3.5–5.1)
Sodium: 141 mmol/L (ref 135–145)

## 2019-06-12 LAB — CBC
HCT: 31.8 % — ABNORMAL LOW (ref 39.0–52.0)
Hemoglobin: 11 g/dL — ABNORMAL LOW (ref 13.0–17.0)
MCH: 30.3 pg (ref 26.0–34.0)
MCHC: 34.6 g/dL (ref 30.0–36.0)
MCV: 87.6 fL (ref 80.0–100.0)
Platelets: 141 10*3/uL — ABNORMAL LOW (ref 150–400)
RBC: 3.63 MIL/uL — ABNORMAL LOW (ref 4.22–5.81)
RDW: 13.8 % (ref 11.5–15.5)
WBC: 4.8 10*3/uL (ref 4.0–10.5)
nRBC: 0 % (ref 0.0–0.2)

## 2019-06-12 MED ORDER — POTASSIUM CHLORIDE CRYS ER 20 MEQ PO TBCR
40.0000 meq | EXTENDED_RELEASE_TABLET | Freq: Once | ORAL | Status: AC
  Administered 2019-06-12: 12:00:00 40 meq via ORAL
  Filled 2019-06-12: qty 2

## 2019-06-12 NOTE — Progress Notes (Signed)
PROGRESS NOTE  Peter Parsons [id number] DOB: [birth year] DOA: 02/11/2019 PCP: System, Provider Not In  Brief History   Peter Parsons a 59 year old male with chronic schizophrenia with frequent suicidal ideations, bipolar disorder with depression, type 2 diabetes mellitus (controlled), CKD stage II, peripheral neuropathy, BPH, hypertension presented with altered mental status. Prolonged hospital stay awaiting placement in a memory care unit. Does not have capacity.  Foley catheter has been placed, removed and replaced and for recurrent urinary retention. Urology has recommended leaving catheter in place at discharge. He has had sepsis due to CAUTI. Treated with pressors and ampicillin for enterococcus faecalis UTI and bacteremia. He has completed the course. There was gross hematuria associated with UTI which resulted in acute blood loss anemia that was symptomatic with hypotension. The patient received 1 unit of PRBC's in transfusion and was transferred to a stepdown unit. He was found to have head lice on [id number] and was treated with permethrin cream.  The patient has been lethargic and with a flat affect. Hospice has been consulted. He has been more communicative in the last couple of days.  Consultants  . Psychiatry . Palliative care . Wound care . Infectious Disease . Urology  Procedures  . Transfusion  Antibiotics   Anti-infectives (From admission, onward)   Start     Dose/Rate Route Frequency Ordered Stop   05/16/19 0800  ampicillin (OMNIPEN) 2 g in sodium chloride 0.9 % 100 mL IVPB     2 g 300 mL/hr over 20 Minutes Intravenous Every 4 hours 05/15/19 1244 05/24/19 2059   05/15/19 2200  ceFEPIme (MAXIPIME) 2 g in sodium chloride 0.9 % 100 mL IVPB  Status:  Discontinued     2 g 200 mL/hr over 30 Minutes Intravenous Every 12 hours 05/15/19 0724 05/15/19 1115   05/15/19 2200  ampicillin (OMNIPEN) 2 g in sodium chloride 0.9 % 100 mL IVPB  Status:  Discontinued     2 g 300  mL/hr over 20 Minutes Intravenous Every 8 hours 05/15/19 1115 05/15/19 1244   05/15/19 1500  vancomycin (VANCOREADY) IVPB 1750 mg/350 mL  Status:  Discontinued     1,750 mg 175 mL/hr over 120 Minutes Intravenous Every 24 hours 05/14/19 1340 05/15/19 0737   05/15/19 1000  vancomycin (VANCOREADY) IVPB 1250 mg/250 mL  Status:  Discontinued     1,250 mg 166.7 mL/hr over 90 Minutes Intravenous Every 24 hours 05/15/19 0737 05/15/19 1115   05/14/19 1400  ceFEPIme (MAXIPIME) 2 g in sodium chloride 0.9 % 100 mL IVPB  Status:  Discontinued     2 g 200 mL/hr over 30 Minutes Intravenous Every 8 hours 05/14/19 1339 05/15/19 0724   05/14/19 1400  vancomycin (VANCOCIN) 2,000 mg in sodium chloride 0.9 % 500 mL IVPB     2,000 mg 250 mL/hr over 120 Minutes Intravenous  Once 05/14/19 1339 05/14/19 1709     Subjective  The patient is resting in bed. He is lethargic and difficult to rouse. No new complaints.  Objective   Vitals:  Vitals:   06/11/19 1617 06/12/19 0059  BP: 109/68 101/68  Pulse: 66 61  Resp: 18 16  Temp: 99.5 F (37.5 C) 98.8 F (37.1 C)  SpO2: 96% 96%   Exam:  Constitutional:  . The patient is somnolent but rouseable. No acute distress. Respiratory:  . No increased work of breathing. . No wheezes, rales, or rhonchi . No tactile fremitus Cardiovascular:  . Regular rate and rhythm . No murmurs, ectopy, or gallups. Marland Kitchen No  lateral PMI. No thrills. Abdomen:  . Abdomen is soft, non-tender, non-distended . No hernias, masses, or organomegaly . Normoactive bowel sounds.  Musculoskeletal:  . No cyanosis, clubbing, or edema Skin:  . No rashes, lesions, ulcers . palpation of skin: no induration or nodules Neurologic:  . CN 2-12 intact . Sensation all 4 extremities intact Psychiatric:  . Pt is lethargic with a flat affect and depressed mood.   I have personally reviewed the following:   Today's Data  . Vitals, BMP, CBC  Micro Data  . No growth surveillance cultures from  05/15/2019.  Scheduled Meds: . atorvastatin  10 mg Oral Daily  . chlorhexidine  15 mL Mouth/Throat BID  . Chlorhexidine Gluconate Cloth  6 each Topical Daily  . collagenase   Topical Daily  . escitalopram  10 mg Oral Daily  . feeding supplement (NEPRO CARB STEADY)  237 mL Oral TID BM  . feeding supplement (PRO-STAT SUGAR FREE 64)  30 mL Oral BID  . gabapentin  300 mg Oral TID  . multivitamin with minerals  1 tablet Oral Daily  . nystatin cream   Topical BID  . polyethylene glycol  17 g Oral BID  . potassium chloride  40 mEq Oral Once  . senna-docusate  2 tablet Oral BID  . sodium chloride flush  10-40 mL Intracatheter Q12H  . tamsulosin  0.4 mg Oral Daily  . traZODone  100 mg Oral QHS  . ziprasidone  60 mg Oral BID WC   Continuous Infusions: . sodium chloride 250 mL (05/23/19 0309)    Principal Problem:   Enterococcal bacteremia Active Problems:   Paranoid schizophrenia (HCC)   Altered mental status   Dementia (HCC)   Weakness   Acute metabolic encephalopathy   Protein-calorie malnutrition, severe   Pressure injury of skin   Hypertension   Type 2 diabetes mellitus (HCC)   Dyslipidemia   BPH (benign prostatic hyperplasia)   Normocytic anemia   Thrombocytopenia (HCC)   Acute urinary retention   Urethral trauma   UTI (urinary tract infection)   LOS: 75 days   A & P  Failure to thrive: Patient has had significant worsening in his functional status since he has been here for the past 73 days. His p.o. intake has decreased, his mood has worsened. He was seen by palliative care and psychiatry yesterday and he was much more communicative and hopeful with them. Patient would benefit from increased interaction and stimulus in the environment. Psychiatry has decreased meds so that patient would be less oversedated this morning so that he is more interactive. The patient is sleeping this morning. Nursing attempted to ambulate the patient today. He was able to march in place and  take 6 steps.  Chronic schizophrenia and depression with bipolar disorder: Appreciate Dr. Satira Sark coming by to see patient and his recommendations. Goal is to decrease morning somnolence as well as to decrease stiffness, increased tone. Medications have been changed as follows: Risperidone discontinued, Ziprasidone (Geodon) 60 mg twice a day started. Trazodone has been decreased to 100 mg at bedtime to decrease AM oversedation. Remeron has been discontinued also  to decrease AM oversedation. Clonazepam and Escitalopram have been continued. Dr. Satira Sark will come back and see the patient in 3 days to assess effects of med changes. Will order EKG to follow QTC.  Head lice: Pt has been treated with permethrin cream 5%. Continue contact precautions.  Pressure injury, stage III, on coccyx and bilateral heels not POA:  Full-thickness tissue  loss, being followed by wound care which is appreciated. Patient has remained afebrile with no leukocytosis. Pt is layed flat on bed and turned side to side to reduce pressure on sacrum.   BPH with urinary retention, now with chronic Foley:  Foley was placed by urology after having a traumatic urethral injury. Failed voiding trail, Coude Foley re-inserted on 4/13. Failed voiding trial 4/20 again. Patient was able to void on his own on 4/22. Foley discontinued. It has now been replaced. He will follow up with urology as outpatient.   Constipation: One unmeasured stool yesterday. Continue miralax bid.   Fungal rash on his face after shaving of beard: Nystatin cream. Keep area clean.  Peripheral neuropathy: Continue gabapentin 300 mg tid as at home.  Severe protein calorie malnutrition: Continue nutrition supplement. Remeron had been started to induce appetite however that was discontinued in order to improve dautime wakefulness. Patient has had decreased p.o. intake over the past several days.  Severe sepsis with septic shock. Resolved. Secondary to catheter  associated UTI. Patient had multiple straight caths done past few days and Foley placed in on 4/1. Transferred to stepdown unit and multiple IV normal saline bolus received. Required Neo-Synephrine overnight and now stable off pressors. Sepsis resolved. Blood and urine culture growing Enterococcus faecalis. Antibiotic narrowed to ampicillin. The patient has completed his course and has had no growth from surveillance cultures.   Hypokalemia/hypomagnesemia: Resolved. Monitor intermittently.  Gross hematuria: Resolved. Due to traumatic catheterization.   AKI on Chronic kidney disease stage II: Resolved. Continue to monitor creatinine, electrolytes, and volume status. Likely due to ATN with sepsis + bladder outlet obstruction.  Type 2 diabetes mellitus: Controlled. HbA1c of 4.5. CBG stable.  Thrombocytopenia: Resolved.Possibly from acute illness.  Essential hypertension: Blood pressures are well controlled on prn lopressor.   Hyperlipidemia: Continue statin as at home.  Dysphagia/dysarthria and left facial droop: MRI negative for CVA. Continue dysphagia diet  DVT prophylaxis: SCD Code Status: Full Family Communication: None Disposition Plan: The patient is from a [place]. It is anticipated that he will be discharged to a memory care unit. Barriers to discharge including difficulty with placement. The patient is medically cleared for discharge.   Peter Schwertner, DO [facility] Direct contact: see [email or web address]  7PM-7AM contact night coverage as above 06/12/2019, 12:04 AM  LOS: 74 days

## 2019-06-13 LAB — CBC
HCT: 31.4 % — ABNORMAL LOW (ref 39.0–52.0)
Hemoglobin: 10.9 g/dL — ABNORMAL LOW (ref 13.0–17.0)
MCH: 30.4 pg (ref 26.0–34.0)
MCHC: 34.7 g/dL (ref 30.0–36.0)
MCV: 87.5 fL (ref 80.0–100.0)
Platelets: 130 10*3/uL — ABNORMAL LOW (ref 150–400)
RBC: 3.59 MIL/uL — ABNORMAL LOW (ref 4.22–5.81)
RDW: 13.7 % (ref 11.5–15.5)
WBC: 4.6 10*3/uL (ref 4.0–10.5)
nRBC: 0 % (ref 0.0–0.2)

## 2019-06-13 LAB — BASIC METABOLIC PANEL
Anion gap: 7 (ref 5–15)
BUN: 46 mg/dL — ABNORMAL HIGH (ref 8–23)
CO2: 34 mmol/L — ABNORMAL HIGH (ref 22–32)
Calcium: 9.5 mg/dL (ref 8.9–10.3)
Chloride: 101 mmol/L (ref 98–111)
Creatinine, Ser: 1 mg/dL (ref 0.61–1.24)
GFR calc Af Amer: >60 mL/min (ref >60)
GFR calc non Af Amer: >60 mL/min (ref >60)
Glucose, Bld: 98 mg/dL (ref 70–99)
Potassium: 3.3 mmol/L — ABNORMAL LOW (ref 3.5–5.1)
Sodium: 142 mmol/L (ref 135–145)

## 2019-06-13 MED ORDER — POTASSIUM CHLORIDE CRYS ER 20 MEQ PO TBCR: 40.0000 meq | EXTENDED_RELEASE_TABLET | Freq: Once | ORAL | Status: DC

## 2019-06-13 MED ORDER — POTASSIUM CHLORIDE CRYS ER 20 MEQ PO TBCR
40.0000 meq | EXTENDED_RELEASE_TABLET | Freq: Once | ORAL | Status: AC
  Administered 2019-06-13: 09:00:00 40 meq via ORAL
  Filled 2019-06-13: qty 2

## 2019-06-13 NOTE — Progress Notes (Addendum)
PROGRESS NOTE  Peter Parsons [id number] DOB: 02-22-1960 DOA: 02/11/2019 PCP: System, Provider Not In  Brief History   Peter Parsons a 59 year old male with chronic schizophrenia with frequent suicidal ideations, bipolar disorder with depression, type 2 diabetes mellitus (controlled), CKD stage II, peripheral neuropathy, BPH, hypertension presented with altered mental status. Prolonged hospital stay awaiting placement in a memory care unit. Does not have capacity.  Foley catheter has been placed, removed and replaced and for recurrent urinary retention. Urology has recommended leaving catheter in place at discharge. He has had sepsis due to CAUTI. Treated with pressors and ampicillin for enterococcus faecalis UTI and bacteremia. He has completed the course. There was gross hematuria associated with UTI which resulted in acute blood loss anemia that was symptomatic with hypotension. The patient received 1 unit of PRBC's in transfusion and was transferred to a stepdown unit. He was found to have head lice on [id number] and was treated with permethrin cream.  The patient has been lethargic and with a flat affect. Hospice has been consulted. He has been more communicative in the last couple of days.  Consultants  . Psychiatry . Palliative care . Wound care . Infectious Disease . Urology  Procedures  . Transfusion  Antibiotics   Anti-infectives (From admission, onward)   Start     Dose/Rate Route Frequency Ordered Stop   05/16/19 0800  ampicillin (OMNIPEN) 2 g in sodium chloride 0.9 % 100 mL IVPB     2 g 300 mL/hr over 20 Minutes Intravenous Every 4 hours 05/15/19 1244 05/24/19 2059   05/15/19 2200  ceFEPIme (MAXIPIME) 2 g in sodium chloride 0.9 % 100 mL IVPB  Status:  Discontinued     2 g 200 mL/hr over 30 Minutes Intravenous Every 12 hours 05/15/19 0724 05/15/19 1115   05/15/19 2200  ampicillin (OMNIPEN) 2 g in sodium chloride 0.9 % 100 mL IVPB  Status:  Discontinued     2 g 300  mL/hr over 20 Minutes Intravenous Every 8 hours 05/15/19 1115 05/15/19 1244   05/15/19 1500  vancomycin (VANCOREADY) IVPB 1750 mg/350 mL  Status:  Discontinued     1,750 mg 175 mL/hr over 120 Minutes Intravenous Every 24 hours 05/14/19 1340 05/15/19 0737   05/15/19 1000  vancomycin (VANCOREADY) IVPB 1250 mg/250 mL  Status:  Discontinued     1,250 mg 166.7 mL/hr over 90 Minutes Intravenous Every 24 hours 05/15/19 0737 05/15/19 1115   05/14/19 1400  ceFEPIme (MAXIPIME) 2 g in sodium chloride 0.9 % 100 mL IVPB  Status:  Discontinued     2 g 200 mL/hr over 30 Minutes Intravenous Every 8 hours 05/14/19 1339 05/15/19 0724   05/14/19 1400  vancomycin (VANCOCIN) 2,000 mg in sodium chloride 0.9 % 500 mL IVPB     2,000 mg 250 mL/hr over 120 Minutes Intravenous  Once 05/14/19 1339 05/14/19 1709     Subjective  The patient is resting in bed. He is awake and communicative today. No new complaints.d  Objective   Vitals:  Vitals:   06/12/19 2355 06/13/19 0746  BP: 105/66 109/67  Pulse: 69 63  Resp: 20 20  Temp: 97.9 F (36.6 C) 98.6 F (37 C)  SpO2: 95% 95%   Exam:  Constitutional:  . The patient is somnolent but rouseable. No acute distress. Respiratory:  . No increased work of breathing. . No wheezes, rales, or rhonchi . No tactile fremitus Cardiovascular:  . Regular rate and rhythm . No murmurs, ectopy, or gallups. . No lateral  PMI. No thrills. Abdomen:  . Abdomen is soft, non-tender, non-distended . No hernias, masses, or organomegaly . Normoactive bowel sounds.  Musculoskeletal:  . No cyanosis, clubbing, or edema Skin:  . No rashes, lesions, ulcers . palpation of skin: no induration or nodules Neurologic:  . CN 2-12 intact . Sensation all 4 extremities intact Psychiatric:  . Pt is more awake and communicative than he has been for days. Flat affect and depressed mood.  I have personally reviewed the following:   Today's Data  . Vitals, BMP, CBC  Micro Data  . No  growth surveillance cultures from 05/15/2019.  Scheduled Meds: . atorvastatin  10 mg Oral Daily  . chlorhexidine  15 mL Mouth/Throat BID  . Chlorhexidine Gluconate Cloth  6 each Topical Daily  . collagenase   Topical Daily  . escitalopram  10 mg Oral Daily  . feeding supplement (NEPRO CARB STEADY)  237 mL Oral TID BM  . feeding supplement (PRO-STAT SUGAR FREE 64)  30 mL Oral BID  . gabapentin  300 mg Oral TID  . multivitamin with minerals  1 tablet Oral Daily  . nystatin cream   Topical BID  . polyethylene glycol  17 g Oral BID  . senna-docusate  2 tablet Oral BID  . sodium chloride flush  10-40 mL Intracatheter Q12H  . tamsulosin  0.4 mg Oral Daily  . traZODone  100 mg Oral QHS  . ziprasidone  60 mg Oral BID WC   Continuous Infusions: . sodium chloride 250 mL (05/23/19 0309)    Principal Problem:   Enterococcal bacteremia Active Problems:   Paranoid schizophrenia (HCC)   Altered mental status   Dementia (HCC)   Weakness   Acute metabolic encephalopathy   Protein-calorie malnutrition, severe   Pressure injury of skin   Hypertension   Type 2 diabetes mellitus (HCC)   Dyslipidemia   BPH (benign prostatic hyperplasia)   Normocytic anemia   Thrombocytopenia (HCC)   Acute urinary retention   Urethral trauma   UTI (urinary tract infection)   LOS: 76 days   A & P  Failure to thrive: Patient has had significant worsening in his functional status since he has been here for the past 73 days. His p.o. intake has decreased, his mood has worsened. He was seen by palliative care and psychiatry yesterday and he was much more communicative and hopeful with them. Patient would benefit from increased interaction and stimulus in the environment. Psychiatry has decreased meds so that patient would be less oversedated this morning so that he is more interactive. He is awake, alert, interactive and communicative today. Nursing attempted to ambulate the patient today. He was able to march in  place and take 6 steps.  Chronic schizophrenia and depression with bipolar disorder: Appreciate Dr. Satira Sark coming by to see patient and his recommendations. Goal is to decrease morning somnolence as well as to decrease stiffness, increased tone. Medications have been changed as follows: Risperidone discontinued, Ziprasidone (Geodon) 60 mg twice a day started. Trazodone has been decreased to 100 mg at bedtime to decrease AM oversedation. Remeron has been discontinued also  to decrease AM oversedation. Clonazepam and Escitalopram have been continued. Dr. Satira Sark will come back and see the patient in 3 days to assess effects of med changes. Will order EKG to follow QTC.  Head lice: Pt has been treated with permethrin cream 5%. Continue contact precautions.  Pressure injury, stage III, on coccyx and bilateral heels not POA:  Full-thickness tissue loss, being  followed by wound care which is appreciated. Patient has remained afebrile with no leukocytosis. Pt is layed flat on bed and turned side to side to reduce pressure on sacrum.   BPH with urinary retention, now with chronic Foley:  Foley was placed by urology after having a traumatic urethral injury. Failed voiding trail, Coude Foley re-inserted on 4/13. Failed voiding trial 4/20 again. Patient was able to void on his own on 4/22. Foley discontinued. It has now been replaced. He will follow up with urology as outpatient.   Constipation: One unmeasured stool yesterday. Continue miralax bid.   Fungal rash on his face after shaving of beard: Nystatin cream. Keep area clean.  Peripheral neuropathy: Continue gabapentin 300 mg tid as at home.  Severe protein calorie malnutrition: Continue nutrition supplement. Remeron had been started to induce appetite however that was discontinued in order to improve dautime wakefulness. Patient has had decreased p.o. intake over the past several days.  Severe sepsis with septic shock. Resolved. Secondary to  catheter associated UTI. Patient had multiple straight caths done past few days and Foley placed in on 4/1. Transferred to stepdown unit and multiple IV normal saline bolus received. Required Neo-Synephrine overnight and now stable off pressors. Sepsis resolved. Blood and urine culture growing Enterococcus faecalis. Antibiotic narrowed to ampicillin. The patient has completed his course and has had no growth from surveillance cultures.   Hypokalemia/hypomagnesemia: Resolved. Monitor intermittently.  Gross hematuria: Resolved. Due to traumatic catheterization.   AKI on Chronic kidney disease stage II: Resolved. Continue to monitor creatinine, electrolytes, and volume status. Likely due to ATN with sepsis + bladder outlet obstruction.  Type 2 diabetes mellitus: Controlled. HbA1c of 4.5. CBG stable.  Thrombocytopenia: Resolved.Possibly from acute illness.  Essential hypertension: Blood pressures are well controlled on prn lopressor.   Hyperlipidemia: Continue statin as at home.  Dysphagia/dysarthria and left facial droop: MRI negative for CVA. Continue dysphagia diet  I have seen and examined this patient myself. I have spent 34 minutes in his evaluation and care.  DVT prophylaxis: SCD Code Status: Full Family Communication: None Disposition Plan: The patient is from a [place]. It is anticipated that he will be discharged to a memory care unit. Barriers to discharge including difficulty with placement. The patient is medically cleared for discharge.   Cael Worth, DO [facility] Direct contact: see [email or web address]  7PM-7AM contact night coverage as above 06/13/2019, 1:59 PM  LOS: 74 days

## 2019-06-14 MED ORDER — POTASSIUM CHLORIDE CRYS ER 20 MEQ PO TBCR
40.0000 meq | EXTENDED_RELEASE_TABLET | Freq: Once | ORAL | Status: AC
  Administered 2019-06-14: 16:00:00 40 meq via ORAL
  Filled 2019-06-14: qty 2

## 2019-06-14 NOTE — Consult Note (Addendum)
WOC follow-up: Reason for Consult: Reassessment of pressure injuries. Wound type: Sacrum with previously noted unstageable wound is now pink, shallow stage 2 pressure injury; .8X.8X.1cm, small amt yellow drainage. Left heel with previously noted deep tissue pressure injury has evolved into a stage 2 pressure injury;  3X2X.1cm, pink and dry, small amt yellow drainage, no odor. Right heel has healed without further wound. Pressure Injury POA: no Dressing procedure/placement/frequency: Discontnue Santyl.  Cont to float heels in Prevalon boots to reduce pressure.  Foam dressing to bilat heels and sacrum to protect from further injury.  Please re-consult if further assistance is needed.  Thank-you,  Julien Girt MSN, [place], [place], [place], [place]

## 2019-06-14 NOTE — Consult Note (Signed)
I spoke with nursing and reviewed his progress since my evaluation on 30 April.  Thus far he is showing a successful response to the change in psychiatric medications.  I would hold off on further changes in medications and stay with the current regiment.

## 2019-06-14 NOTE — Progress Notes (Addendum)
PROGRESS NOTE  Peter Parsons [id number] DOB: 03-Mar-1960 DOA: 02/11/2019 PCP: System, Provider Not In  Brief History   Peter Parsons a 59 year old male with chronic schizophrenia with frequent suicidal ideations, bipolar disorder with depression, type 2 diabetes mellitus (controlled), CKD stage II, peripheral neuropathy, BPH, hypertension presented with altered mental status. Prolonged hospital stay awaiting placement in a memory care unit. Does not have capacity.  Foley catheter has been placed, removed and replaced and for recurrent urinary retention. Urology has recommended leaving catheter in place at discharge. He has had sepsis due to CAUTI. Treated with pressors and ampicillin for enterococcus faecalis UTI and bacteremia. He has completed the course. There was gross hematuria associated with UTI which resulted in acute blood loss anemia that was symptomatic with hypotension. The patient received 1 unit of PRBC's in transfusion and was transferred to a stepdown unit. He was found to have head lice on [id number] and was treated with permethrin cream.  06/14/19: Patient is awake and eating breakfast when I come in to see him which is unusual for him.  He is usually quite lethargic and difficult to arouse.  And answers yes/no questions appropriately.  He states he would like to walk more today.  Has no complaints.   Consultants  . Psychiatry . Palliative care . Wound care . Infectious Disease . Urology  Procedures  . Transfusion  Antibiotics   Anti-infectives (From admission, onward)   Start     Dose/Rate Route Frequency Ordered Stop   05/16/19 0800  ampicillin (OMNIPEN) 2 g in sodium chloride 0.9 % 100 mL IVPB     2 g 300 mL/hr over 20 Minutes Intravenous Every 4 hours 05/15/19 1244 05/24/19 2059   05/15/19 2200  ceFEPIme (MAXIPIME) 2 g in sodium chloride 0.9 % 100 mL IVPB  Status:  Discontinued     2 g 200 mL/hr over 30 Minutes Intravenous Every 12 hours 05/15/19 0724 05/15/19  1115   05/15/19 2200  ampicillin (OMNIPEN) 2 g in sodium chloride 0.9 % 100 mL IVPB  Status:  Discontinued     2 g 300 mL/hr over 20 Minutes Intravenous Every 8 hours 05/15/19 1115 05/15/19 1244   05/15/19 1500  vancomycin (VANCOREADY) IVPB 1750 mg/350 mL  Status:  Discontinued     1,750 mg 175 mL/hr over 120 Minutes Intravenous Every 24 hours 05/14/19 1340 05/15/19 0737   05/15/19 1000  vancomycin (VANCOREADY) IVPB 1250 mg/250 mL  Status:  Discontinued     1,250 mg 166.7 mL/hr over 90 Minutes Intravenous Every 24 hours 05/15/19 0737 05/15/19 1115   05/14/19 1400  ceFEPIme (MAXIPIME) 2 g in sodium chloride 0.9 % 100 mL IVPB  Status:  Discontinued     2 g 200 mL/hr over 30 Minutes Intravenous Every 8 hours 05/14/19 1339 05/15/19 0724   05/14/19 1400  vancomycin (VANCOCIN) 2,000 mg in sodium chloride 0.9 % 500 mL IVPB     2,000 mg 250 mL/hr over 120 Minutes Intravenous  Once 05/14/19 1339 05/14/19 1709     Subjective  The patient is resting in bed. He is awake and communicative today. No new complaints.d  Objective   Vitals:  Vitals:   06/14/19 1300 06/14/19 1548  BP:  (!) 149/85  Pulse:  88  Resp:  18  Temp: 99 F (37.2 C) 98.6 F (37 C)  SpO2:  98%   Exam:  Constitutional:  . The patient is somnolent but rouseable. No acute distress. Respiratory:  . No increased work of breathing. Marland Kitchen  No wheezes, rales, or rhonchi . No tactile fremitus Cardiovascular:  . Regular rate and rhythm . No murmurs, ectopy, or gallups. . No lateral PMI. No thrills. Abdomen:  . Abdomen is soft, non-tender, non-distended . No hernias, masses, or organomegaly . Normoactive bowel sounds.  Musculoskeletal:  . No cyanosis, clubbing, or edema Skin:  . No rashes, lesions, ulcers . palpation of skin: no induration or nodules Neurologic:  . CN 2-12 intact . Sensation all 4 extremities intact Psychiatric:  . Pt is more awake and communicative than he has been for days. Flat affect and depressed  mood.  I have personally reviewed the following:   Today's Data  . Vitals, BMP, CBC  Micro Data  . No growth surveillance cultures from 05/15/2019.  Scheduled Meds: . atorvastatin  10 mg Oral Daily  . chlorhexidine  15 mL Mouth/Throat BID  . escitalopram  10 mg Oral Daily  . feeding supplement (NEPRO CARB STEADY)  237 mL Oral TID BM  . feeding supplement (PRO-STAT SUGAR FREE 64)  30 mL Oral BID  . gabapentin  300 mg Oral TID  . multivitamin with minerals  1 tablet Oral Daily  . nystatin cream   Topical BID  . polyethylene glycol  17 g Oral BID  . senna-docusate  2 tablet Oral BID  . sodium chloride flush  10-40 mL Intracatheter Q12H  . tamsulosin  0.4 mg Oral Daily  . traZODone  100 mg Oral QHS  . ziprasidone  60 mg Oral BID WC   Continuous Infusions: . sodium chloride 250 mL (05/23/19 0309)    Principal Problem:   Enterococcal bacteremia Active Problems:   Paranoid schizophrenia (HCC)   Altered mental status   Dementia (HCC)   Weakness   Acute metabolic encephalopathy   Protein-calorie malnutrition, severe   Pressure injury of skin   Hypertension   Type 2 diabetes mellitus (HCC)   Dyslipidemia   BPH (benign prostatic hyperplasia)   Normocytic anemia   Thrombocytopenia (HCC)   Acute urinary retention   Urethral trauma   UTI (urinary tract infection)   LOS: 77 days   A & P   Failure to thrive Patient does seem improved with medication changes, much less lethargic in the morning. We will request physical therapy to work with patient so that he is not lying in bed all day.  Chronic schizophrenia and Depression with bipolar disorder Appreciate Dr. Satira Sark coming by to see patient and his recommendations. Patient's morning somnolence is an in fact much improved. Unclear whether his stiffness and increased tone is also improved. QTC is 433 on [id number]  Head lice I examined patient myself and did not see any evidence of lice or eggs. Patient is already been  treated with permethrin cream 5%.  Pressure injury, stage III, on coccyx and bilateral heels not POA Full-thickness tissue loss, being followed by wound care which is appreciated. Patient has remained afebrile with no leukocytosis  BPH with urinary retention, now with chronic Foley Foley was placed by urology after having a traumatic urethral injury  Failed voiding trail, Coude Foley re-inserted on 4/13. Failed voiding trial 4/20 again Patient was able to void on his own on 4/22. Foley discontinued.  Constipation. Miralax BID  Fungal rash on his face after shaving of beard.  Try keeping that area dry. Nystatin cream  Peripheral neuropathy Continue gabapentin  Severe protein calorie malnutrition Continue nutrition supplement Remeron had been started to induce appetite however that was discontinued as noted above. Patient  has had decreased p.o. intake over the past several days.    Previous problems, now resolved: Severe sepsis with septic shock. Resolved. Secondary to catheter associated UTI. Patient had multiple straight caths done past few days and Foley placed in on 4/1. Transferred to stepdown unit and multiple IV normal saline bolus received. Required Neo-Synephrine overnight and now stable off pressors. Sepsis resolved. Blood and urine culture growing Enterococcus faecalis. Antibiotic narrowed to ampicillin -completed course.  Hypokalemia/hypomagnesemia, Resolved. Replete electrolytes as needed and monitor.  Gross hematuria, resolved Secondary to traumatic catheterization.Foley removed and urology placed in a 20 [place] coud Foley without resistance. Recommend intermittent irrigation, keep Foley upon discharge and follow-up with urology in [place]. Urine now clear. Received 1 unit PRBC for drop in H&H.  AKI, resolved chronic kidney disease stage II Likely ATN with sepsis + bladder outlet obstruction.   Type 2 diabetes mellitus,  controlled A1c of 4.5. CBG stable.  Thrombocytopenia, resolved Unclear etiology.Possibly from acute illness.  Hypertension/ hyperlipidemia.  BP within goal. Off all blood pressure meds. Continue statin.  Dysphagia/dysarthria and left facial droop MRI negative for CVA. Continue dysphagia diet    DVT prophylaxis: SCD Code Status: Full Family Communication: None Disposition Plan: The patient is from a [place]. It is anticipated that he will be discharged to a memory care unit. Barriers to discharge including difficulty with placement. The patient is medically cleared for discharge.

## 2019-06-14 NOTE — Progress Notes (Signed)
Plan of care reviewed with patient; verbalized understanding; encouraging movement did not engage; mepelex on sacrum was changed and right heel dressing intact. Bilateral boots in place; denies any pain; safety precautions maintained; call bell within reach.

## 2019-06-14 NOTE — Progress Notes (Signed)
Nutrition Follow-up  RD working remotely.  DOCUMENTATION CODES:   Severe malnutrition in context of social or environmental circumstances  INTERVENTION:  ContinueNepro Shake poTID, each supplement provides 425 kcal and 19 grams protein.  Continue Pro-Stat 30 mL po BID, each supplement provides 100 kcal and 15 grams of protein.  Continue daily MVI.  NUTRITION DIAGNOSIS:   Severe Malnutrition related to social / environmental circumstances(inadequate oral intake) as evidenced by percent weight loss, moderate fat depletion, moderate muscle depletion, severe muscle depletion.  Ongoing.  GOAL:   Patient will meet greater than or equal to 90% of their needs  Progressing.  MONITOR:   PO intake, Supplement acceptance, Labs, Weight trends, I & O's  REASON FOR ASSESSMENT:   Malnutrition Screening Tool    ASSESSMENT:   59 year old male with PMHx of schizophrenia, HTN, DM who has been in [place] for 44 days and is now admitted with generalized weakness, dysphagia, slurred speech, left facial droop to rule out CVA.  Attempted to call patient over the phone but he was unable to answer. According to chart patient has been more communicative in the past couple days. According to chart he is eating 25-50% of his meals. He continues to drink his Nepro TID and Pro-Stat BID.  Medications reviewed and include: MVI daily, Miralax 17 grams BID, senna-docusate 2 tablets BID.  Labs reviewed: On 5/2 Potassium 3.3, CO2 34, BUN 46.  Diet Order:   Diet Order            DIET - DYS 1 Room service appropriate? Yes with Assist; Fluid consistency: Thin  Diet effective now             EDUCATION NEEDS:   No education needs have been identified at this time  Skin:  Skin Assessment: Skin Integrity Issues:(unstageable sacrum (1cm x 1cm); DTI left heel (3cm x 2cm); DTI right heel (0.5cm x 1cm))   Last BM:  06/14/2019 - large type 6  Height:   Ht Readings from Last 1 Encounters:  03/28/19 5\' 9"   (1.753 m)   Weight:   Wt Readings from Last 1 Encounters:  06/01/19 82.4 kg   Ideal Body Weight:  72.7 kg  BMI:  Body mass index is 26.83 kg/m.  Estimated Nutritional Needs:   Kcal:  2000-2200  Protein:  100-110 grams  Fluid:  >/= 2 L/day  Jacklynn Barnacle, MS, RD, LDN Pager number available on Amion

## 2019-06-15 NOTE — Progress Notes (Signed)
PROGRESS NOTE  Peter Parsons [id number] DOB: [birth year] DOA: 02/11/2019 PCP: System, Provider Not In  Brief History   Jylan Geathers a 59 year old male with chronic schizophrenia with frequent suicidal ideations, bipolar disorder with depression, type 2 diabetes mellitus (controlled), CKD stage II, peripheral neuropathy, BPH, hypertension presented with altered mental status. Prolonged hospital stay awaiting placement in a memory care unit. Does not have capacity.  Foley catheter has been placed, removed and replaced and for recurrent urinary retention. Urology has recommended leaving catheter in place at discharge. He has had sepsis due to CAUTI. Treated with pressors and ampicillin for enterococcus faecalis UTI and bacteremia. He has completed the course. There was gross hematuria associated with UTI which resulted in acute blood loss anemia that was symptomatic with hypotension. The patient received 1 unit of PRBC's in transfusion and was transferred to a stepdown unit. He was found to have head lice on [id number] and was treated with permethrin cream.  06/14/19: Patient is awake and eating breakfast when I come in to see him which is unusual for him.  He is usually quite lethargic and difficult to arouse.  And answers yes/no questions appropriately.  He states he would like to walk more today.  Has no complaints. 06/15/19: Patient is lying in bed quietly with his eyes open.  He is slow to answer but does answer in 1-2 word questions.  He says he is okay.  Has no complaints.  Denies pain.  Says he did get up and walk a little yesterday.  Says he does like to walk.   Consultants  . Psychiatry . Palliative care . Wound care . Infectious Disease . Urology  Procedures  . Transfusion  Antibiotics   Anti-infectives (From admission, onward)   Start     Dose/Rate Route Frequency Ordered Stop   05/16/19 0800  ampicillin (OMNIPEN) 2 g in sodium chloride 0.9 % 100 mL IVPB     2 g 300 mL/hr  over 20 Minutes Intravenous Every 4 hours 05/15/19 1244 05/24/19 2059   05/15/19 2200  ceFEPIme (MAXIPIME) 2 g in sodium chloride 0.9 % 100 mL IVPB  Status:  Discontinued     2 g 200 mL/hr over 30 Minutes Intravenous Every 12 hours 05/15/19 0724 05/15/19 1115   05/15/19 2200  ampicillin (OMNIPEN) 2 g in sodium chloride 0.9 % 100 mL IVPB  Status:  Discontinued     2 g 300 mL/hr over 20 Minutes Intravenous Every 8 hours 05/15/19 1115 05/15/19 1244   05/15/19 1500  vancomycin (VANCOREADY) IVPB 1750 mg/350 mL  Status:  Discontinued     1,750 mg 175 mL/hr over 120 Minutes Intravenous Every 24 hours 05/14/19 1340 05/15/19 0737   05/15/19 1000  vancomycin (VANCOREADY) IVPB 1250 mg/250 mL  Status:  Discontinued     1,250 mg 166.7 mL/hr over 90 Minutes Intravenous Every 24 hours 05/15/19 0737 05/15/19 1115   05/14/19 1400  ceFEPIme (MAXIPIME) 2 g in sodium chloride 0.9 % 100 mL IVPB  Status:  Discontinued     2 g 200 mL/hr over 30 Minutes Intravenous Every 8 hours 05/14/19 1339 05/15/19 0724   05/14/19 1400  vancomycin (VANCOCIN) 2,000 mg in sodium chloride 0.9 % 500 mL IVPB     2,000 mg 250 mL/hr over 120 Minutes Intravenous  Once 05/14/19 1339 05/14/19 1709     Subjective  The patient is resting in bed. He is awake and communicative today. No new complaints.d  Objective   Vitals:  Vitals:  06/15/19 0025 06/15/19 0744  BP: (!) 117/57 110/76  Pulse: 63 67  Resp: 20 20  Temp: 98.5 F (36.9 C) 98 F (36.7 C)  SpO2: 99% 97%   Exam: Physical Exam: Blood pressure 110/76, pulse 67, temperature 98 F (36.7 C), temperature source Oral, resp. rate 20, height 5\' 9"  (1.753 m), weight 79.8 kg, SpO2 97 %. Gen: Patient lying flat in bed with eyes open, lethargic but awake. Eyes: sclera anicteric, conjuctiva mildly injected bilaterally CVS: S1-S2, regulary, no gallops Respiratory:  decreased air entry likely secondary to decreased inspiratory effort GI: NABS, soft, NT  LE: No edema. No  cyanosis Neuro: grossly nonfocal.  Psych: Continued flat affect but he is definitely not as lethargic, he is awake and communicative. Skin: no rashes or lesions or ulcers,    Today's Data  . Vitals, BMP, CBC  Micro Data  . No growth surveillance cultures from 05/15/2019.  Scheduled Meds: . atorvastatin  10 mg Oral Daily  . chlorhexidine  15 mL Mouth/Throat BID  . escitalopram  10 mg Oral Daily  . feeding supplement (NEPRO CARB STEADY)  237 mL Oral TID BM  . feeding supplement (PRO-STAT SUGAR FREE 64)  30 mL Oral BID  . gabapentin  300 mg Oral TID  . multivitamin with minerals  1 tablet Oral Daily  . nystatin cream   Topical BID  . polyethylene glycol  17 g Oral BID  . senna-docusate  2 tablet Oral BID  . sodium chloride flush  10-40 mL Intracatheter Q12H  . tamsulosin  0.4 mg Oral Daily  . traZODone  100 mg Oral QHS  . ziprasidone  60 mg Oral BID WC   Continuous Infusions: . sodium chloride 250 mL (05/23/19 0309)    Principal Problem:   Enterococcal bacteremia Active Problems:   Paranoid schizophrenia (HCC)   Altered mental status   Dementia (HCC)   Weakness   Acute metabolic encephalopathy   Protein-calorie malnutrition, severe   Pressure injury of skin   Hypertension   Type 2 diabetes mellitus (HCC)   Dyslipidemia   BPH (benign prostatic hyperplasia)   Normocytic anemia   Thrombocytopenia (HCC)   Acute urinary retention   Urethral trauma   UTI (urinary tract infection)   LOS: 78 days   A & P   Failure to thrive Patient is much improved with medication changes, much less lethargic in the morning. Physical therapy worked with the patient and noted that they do not need to be involved anymore the nursing can ambulate him. Nursing order placed to ambulate patient every shift.  He should be getting up out of bed and walking around the unit as much as possible.  Chronic schizophrenia and Depression with bipolar disorder Appreciate Dr. Satira Sark coming by to see  patient and his recommendations. Patient's morning somnolence is an in fact much improved. Unclear whether his stiffness and increased tone is also improved. QTC is 433 on [id number]  Head lice I examined patient myself and did not see any evidence of lice or eggs. Patient is already been treated with permethrin cream 5%.  Pressure injury, stage III, on coccyx and bilateral heels not POA Full-thickness tissue loss, being followed by wound care which is appreciated. Patient has remained afebrile with no leukocytosis  BPH with urinary retention, now with chronic Foley Foley was placed by urology after having a traumatic urethral injury  Failed voiding trail, Coude Foley re-inserted on 4/13. Failed voiding trial 4/20 again Patient was able to void on  his own on 4/22. Foley discontinued.  Constipation. Miralax BID  Fungal rash on his face after shaving of beard.  Try keeping that area dry. Nystatin cream  Peripheral neuropathy Continue gabapentin  Severe protein calorie malnutrition Continue nutrition supplement Remeron had been started to induce appetite however that was discontinued as noted above. Patient has had decreased p.o. intake over the past several days.    Previous problems, now resolved: Severe sepsis with septic shock. Resolved. Secondary to catheter associated UTI. Patient had multiple straight caths done past few days and Foley placed in on 4/1. Transferred to stepdown unit and multiple IV normal saline bolus received. Required Neo-Synephrine overnight and now stable off pressors. Sepsis resolved. Blood and urine culture growing Enterococcus faecalis. Antibiotic narrowed to ampicillin -completed course.  Hypokalemia/hypomagnesemia, Resolved. Replete electrolytes as needed and monitor.  Gross hematuria, resolved Secondary to traumatic catheterization.Foley removed and urology placed in a 20 [place] coud Foley without resistance. Recommend  intermittent irrigation, keep Foley upon discharge and follow-up with urology in [place]. Urine now clear. Received 1 unit PRBC for drop in H&H.  AKI, resolved chronic kidney disease stage II Likely ATN with sepsis + bladder outlet obstruction.   Type 2 diabetes mellitus, controlled A1c of 4.5. CBG stable.  Thrombocytopenia, resolved Unclear etiology.Possibly from acute illness.  Hypertension/ hyperlipidemia.  BP within goal. Off all blood pressure meds. Continue statin.  Dysphagia/dysarthria and left facial droop MRI negative for CVA. Continue dysphagia diet    DVT prophylaxis: SCD Code Status: Full Family Communication: None Disposition Plan: The patient is from a [place]. It is anticipated that he will be discharged to a memory care unit. Barriers to discharge including difficulty with placement. The patient is medically cleared for discharge.

## 2019-06-15 NOTE — Progress Notes (Signed)
Patient wanted his parents to come visit him and he said he wanted them to come today. Called his DSS legal guardian Aida RaiderEulas Post and she said that would be fine for the patients parents to come visits him.

## 2019-06-15 NOTE — Consult Note (Signed)
I saw the patient and discussed him with nursing staff.  Overall he is showing substantial improvement yet I do not believe he has had sufficient improvement to be confident about discharge.  To me he shows the very flat affect that was also noticed by physical therapy.  I would consider decreasing the dose of trazodone to 50mg . If he desires a medication for sleep, melatonin 3mg  po at night might be more beneficial and less disruptive.
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
## 2019-06-16 NOTE — Progress Notes (Signed)
PROGRESS NOTE    Peter Parsons   [id number]  DOB: 06/02/1960  PCP: System, Provider Not In    DOA: 02/11/2019 LOS: 77   Brief Narrative   My Sidel is a 59 year old male with chronic schizophrenia with frequent suicidal ideations, bipolar disorder with depression, type 2 diabetes mellitus (controlled), CKD stage II, peripheral neuropathy, BPH, hypertension presented with altered mental status.  Prolonged hospital stay awaiting placement in a memory care unit.  Does not have capacity.   Foley catheter has been placed, removed and replaced and for recurrent urinary retention. Urology has recommended leaving catheter in place at discharge. He has had sepsis due to CAUTI. Treated with pressors and ampicillin for enterococcus faecalis UTI and bacteremia. He has completed the course. There was gross hematuria associated with UTI which resulted in acute blood loss anemia that was symptomatic with hypotension. The patient received 1 unit of PRBC's in transfusion and was transferred to a stepdown unit. He was found to have head lice on [id number] and was treated with permethrin cream.     Assessment & Plan   Principal Problem:   Enterococcal bacteremia Active Problems:   Paranoid schizophrenia ([place])   Altered mental status   Dementia ([place])   Weakness   Acute metabolic encephalopathy   Protein-calorie malnutrition, severe   Pressure injury of skin   Hypertension   Type 2 diabetes mellitus (HCC)   Dyslipidemia   BPH (benign prostatic hyperplasia)   Normocytic anemia   Thrombocytopenia (HCC)   Acute urinary retention   Urethral trauma   UTI (urinary tract infection)  Failure to thrive Patient is much improved with medication changes, much less lethargic in the morning. Physical therapy worked with the patient and noted that they do not need to be involved anymore the nursing can ambulate him. Nursing order placed to ambulate patient every shift.  He should be getting up out of  bed and walking around the unit as much as possible.  Chronic schizophreniaand Depression with bipolar disorder Appreciate Dr. Satira Sark coming by to see patient and his recommendations. Unclear whether his stiffness and increased tone is also improved. QTC is 433 on [id number]  Head lice - present on admission - resolved per prior attending notes --Already been treated with permethrin cream 5%.  Pressure injury, stage III, on coccyx and bilateral heels not POA Patient has remained afebrile with no leukocytosis to suggest infection. --Full-thickness tissue loss, being followed by wound care which is appreciated.  BPH with urinary retention, now with chronic Foley Foley was placed by urology after having a traumatic urethral injury  Failed voiding trail, Coude Foley re-inserted on 4/13. Failed voiding trial 4/20 again Patient was able to void on his own on 4/22. Foley discontinued.  Constipation --Miralax BID  Fungal rash on his face after shaving of beard  --Keep area dry, continue Nystatin cream  Peripheral neuropathy --Continue gabapentin  Severe protein calorie malnutrition Remeron had been started to induce appetite however that was discontinued as noted above. Patient has had decreased p.o. intake over the past several days. --Continue nutrition supplement    Previous problems, now resolved:  Severe sepsis with septic shock. Resolved. Secondary to catheter associated UTI. Patient had multiple straight caths done past few days and Foley placed in on 4/1. Transferred to stepdown unit and multiple IV normal saline bolus received. Required Neo-Synephrine overnight and now stable off pressors. Sepsis resolved. Blood and urine culture growing Enterococcus faecalis. Antibiotic narrowed to ampicillin -completed course.  Hypokalemia/hypomagnesemia, Resolved. Replete  electrolytes as needed and monitor.  Gross hematuria, resolved Secondary to traumatic  catheterization.Foley removed and urology placed in a 20 [place] coud Foley without resistance. Recommend intermittent irrigation, keep Foley upon discharge and follow-up with urology in [place]. Urine now clear. Received 1 unit PRBC for drop in H&H.  AKI, resolved chronic kidney disease stage II Likely ATN with sepsis + bladder outlet obstruction.   Type 2 diabetes mellitus, controlled A1c of 4.5. CBG stable.  Thrombocytopenia, resolved Unclear etiology.Possibly from acute illness.  Hypertension/ hyperlipidemia.  BP within goal. Off all blood pressure meds. Continue statin.  Dysphagia/dysarthria and left facial droop MRI negative for CVA. Continue dysphagia diet  Patient BMI: Body mass index is 25.98 kg/m.   DVT prophylaxis: SCD's  Diet:  Diet Orders (From admission, onward)    Start     Ordered   04/02/19 1459  DIET - DYS 1 Room service appropriate? Yes with Assist; Fluid consistency: Thin  Diet effective now    Comments: Extra gravy on meats, potatoes. Cream Soups, puddings, ice creams, Yogurts TID meals. Pt may have Oatmeal per speech.  Question Answer Comment  Room service appropriate? Yes with Assist   Fluid consistency: Thin      04/02/19 1459            Code Status: Full Code    Subjective 06/16/19    Patient sitting up in bed when seen this AM.  He is awake and responds to yes/no questions.  Denies pain or discomfort, chest pain, fevers or other complaints.   Disposition Plan & Communication   Status is: Inpatient  Remains inpatient appropriate because:he requires placment at a memory care unit for which placement in pending.   Dispo: The patient is from: Group home              Anticipated d/c is to: SNF              Anticipated d/c date is: > 3 days              Patient currently is medically stable to d/c.   Family Communication: none at bedside, will attempt to call      Objective   Vitals:   06/15/19 0744 06/15/19  1520 06/15/19 2345 06/16/19 0752  BP: 110/76 119/81 113/69 116/75  Pulse: 67 72 (!) 54 (!) 57  Resp: 20  16   Temp: 98 F (36.7 C) 98.4 F (36.9 C) 98.4 F (36.9 C) 98.5 F (36.9 C)  TempSrc: Oral Oral Oral Oral  SpO2: 97% 96% 97% 95%  Weight:      Height:        Intake/Output Summary (Last 24 hours) at 06/16/2019 1409 Last data filed at 06/16/2019 1012 Gross per 24 hour  Intake 300 ml  Output 750 ml  Net -450 ml   Filed Weights   05/25/19 0500 06/01/19 0500 06/15/19 0500  Weight: 83.5 kg 82.4 kg 79.8 kg    Physical Exam:  General exam: awake, alert, no acute distress Respiratory system: CTAB, normal respiratory effort. Cardiovascular system: normal S1/S2, RRR, no pedal edema.   Extremities: offloading boots on b/l feet, moves all, no edema Psychiatry: normal mood, congruent affect, judgement and insight appear normal  Labs   Data Reviewed: I have personally reviewed following labs and imaging studies  CBC: Recent Labs  Lab 06/12/19 0459 06/13/19 0522  WBC 4.8 4.6  HGB 11.0* 10.9*  HCT 31.8* 31.4*  MCV 87.6 87.5  PLT 141* 130*  Basic Metabolic Panel: Recent Labs  Lab 06/12/19 0459 06/13/19 0522  NA 141 142  K 3.3* 3.3*  CL 99 101  CO2 31 34*  GLUCOSE 95 98  BUN 48* 46*  CREATININE 1.07 1.00  CALCIUM 9.4 9.5   GFR: Estimated Creatinine Clearance: 77.6 mL/min (by C-G formula based on SCr of 1 mg/dL). Liver Function Tests: No results for input(s): AST, ALT, ALKPHOS, BILITOT, PROT, ALBUMIN in the last 168 hours. No results for input(s): LIPASE, AMYLASE in the last 168 hours. No results for input(s): AMMONIA in the last 168 hours. Coagulation Profile: No results for input(s): INR, PROTIME in the last 168 hours. Cardiac Enzymes: No results for input(s): CKTOTAL, CKMB, CKMBINDEX, TROPONINI in the last 168 hours. BNP (last 3 results) No results for input(s): PROBNP in the last 8760 hours. HbA1C: No results for input(s): HGBA1C in the last 72  hours. CBG: No results for input(s): GLUCAP in the last 168 hours. Lipid Profile: No results for input(s): CHOL, HDL, LDLCALC, TRIG, CHOLHDL, LDLDIRECT in the last 72 hours. Thyroid Function Tests: No results for input(s): TSH, T4TOTAL, FREET4, T3FREE, THYROIDAB in the last 72 hours. Anemia Panel: No results for input(s): VITAMINB12, FOLATE, FERRITIN, TIBC, IRON, RETICCTPCT in the last 72 hours. Sepsis Labs: No results for input(s): PROCALCITON, LATICACIDVEN in the last 168 hours.  No results found for this or any previous visit (from the past 240 hour(s)).    Imaging Studies   No results found.   Medications   Scheduled Meds: . atorvastatin  10 mg Oral Daily  . chlorhexidine  15 mL Mouth/Throat BID  . escitalopram  10 mg Oral Daily  . feeding supplement (NEPRO CARB STEADY)  237 mL Oral TID BM  . feeding supplement (PRO-STAT SUGAR FREE 64)  30 mL Oral BID  . gabapentin  300 mg Oral TID  . multivitamin with minerals  1 tablet Oral Daily  . nystatin cream   Topical BID  . polyethylene glycol  17 g Oral BID  . senna-docusate  2 tablet Oral BID  . sodium chloride flush  10-40 mL Intracatheter Q12H  . tamsulosin  0.4 mg Oral Daily  . traZODone  100 mg Oral QHS  . ziprasidone  60 mg Oral BID WC   Continuous Infusions: . sodium chloride 250 mL (05/23/19 0309)       LOS: 79 days    Time spent: 20 minutes    Ezekiel Slocumb, DO [facility]  06/16/2019, 2:09 PM    If 7PM-7AM, please contact night-coverage. How to contact the [facility] Attending or Consulting provider [place] or covering provider during after hours [place], for this patient?    1. Check the care team in [facility] and look for a) attending/consulting TRH provider listed and b) the [facility] team listed 2. Log into [email or web address] and use [place]'s universal password to access. If you do not have the password, please contact the hospital operator. 3. Locate the [facility] provider you are looking for under [facility] and page to a number that you can be directly reached. 4. If you still have difficulty reaching the provider, please page the [facility] (Director on Call) for the Hospitalists listed on amion for assistance.

## 2019-06-16 NOTE — TOC Progression Note (Signed)
Transition of Care [facility]) - Progression Note    Patient Details  Name: Peter Parsons MRN: [id number] Date of Birth: March 02, 1960  [facility]) CM/SW Contact  Shelbie Ammons, RN Phone Number: 06/16/2019, 1:13 PM  Clinical Narrative:   Received communication back from [place] with [facility] that they would not be able to accept the patient. RNCM reached out to [facility] with [facility], left VM and requested that he review case.     Expected Discharge Plan: Group Home Barriers to Discharge: SNF Pending bed offer  Expected Discharge Plan and Services Expected Discharge Plan: Group Home   Discharge Planning Services: CM Consult   Living arrangements for the past 2 months: Group Home                 DME Arranged: N/A DME Agency: [employer] Date DME Agency Contacted: 04/12/19 Time DME Agency Contacted: [phone] Representative spoke with at DME Agency: none needed [place] Arranged: NA           Social Determinants of Health ([place]) Interventions    Readmission Risk Interventions Readmission Risk Prevention Plan 04/27/2019 04/20/2019  Transportation Screening Complete Complete  PCP or Specialist Appt within 3-5 Days - Not Complete  Not Complete comments - not ready to [place] or Home Care Consult - Not Complete  HRI or Home Care Consult comments - not ready toDC, looking for a bed for placement  Social Work Consult for [place] Planning/Counseling - [place] - Not Applicable  Medication Review (RN Care Manager) Referral to Pharmacy Referral to Pharmacy  PCP or Specialist appointment within 3-5 days of discharge Complete -  [place] or [place] Complete -  [place] Not Applicable -

## 2019-06-16 NOTE — TOC Progression Note (Signed)
Transition of Care [facility]) - Progression Note    Patient Details  Name: Peter Parsons MRN: [id number] Date of Birth: 06-Jun-1960  Transition of Care [facility]) CM/SW Contact  Shelbie Ammons, RN Phone Number: 06/16/2019, 9:21 AM  Clinical Narrative:   RNCM reached out to [facility] with [facility] to discuss possible placement. Patient has been without behaviors for greater than 2 months. Psychiatry has been working closer with patient over last couple of weeks attempting to make medication changes that will benefit him. [facility] reports that they will review his case and get back to this CM.     Expected Discharge Plan: Group Home Barriers to Discharge: SNF Pending bed offer  Expected Discharge Plan and Services Expected Discharge Plan: Group Home   Discharge Planning Services: CM Consult   Living arrangements for the past 2 months: Group Home                 DME Arranged: N/A DME Agency: [employer] Date DME Agency Contacted: 04/12/19 Time DME Agency Contacted: [phone] Representative spoke with at DME Agency: none needed [place] Arranged: NA           Social Determinants of Health ([place]) Interventions    Readmission Risk Interventions Readmission Risk Prevention Plan 04/27/2019 04/20/2019  Transportation Screening Complete Complete  PCP or Specialist Appt within 3-5 Days - Not Complete  Not Complete comments - not ready to [place] or Home Care Consult - Not Complete  HRI or Home Care Consult comments - not ready toDC, looking for a bed for placement  Social Work Consult for [place] Planning/Counseling - [place] - Not Applicable  Medication Review (RN Care Manager) Referral to Pharmacy Referral to Pharmacy  PCP or Specialist appointment within 3-5 days of discharge Complete -  [place] or [place] Complete -  [place] Not Applicable -
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
## 2019-06-17 MED ORDER — TRAZODONE HCL 50 MG PO TABS
50.0000 mg | ORAL_TABLET | Freq: Every day | ORAL | Status: DC
  Administered 2019-06-17 – 2019-07-11 (×25): 50 mg via ORAL
  Filled 2019-06-17 (×25): qty 1

## 2019-06-17 MED ORDER — ZIPRASIDONE HCL 40 MG PO CAPS
60.0000 mg | ORAL_CAPSULE | Freq: Two times a day (BID) | ORAL | Status: DC
  Administered 2019-06-17 – 2019-06-24 (×14): 60 mg via ORAL
  Filled 2019-06-17 (×15): qty 1

## 2019-06-17 NOTE — Consult Note (Signed)
Nursing note is noted. I talked briefly with the patient. He is still quite groggy. I decreased trazodone to 50qh and moved the pm ziprasidone from 5pm to 9pm. If necessary we can decrease the dose of ziprasidone if lethargy continues.

## 2019-06-17 NOTE — Progress Notes (Signed)
Went in room to give patient his morning medications and patient said to me that he keeps having a horrible dream. I asked what it was about and he said he couldn't talk about it. The patient stated, "I am living in a total hell and I can't get out of it." I asked him  why couldn't he change his situation? Patient stated, "Your reality and my reality are not the same." He said, "I constantly live in and have this perpetual dream I can't escape." He said he has done horrible things. I asked him if someone has hurt him, he said no and I asked him if he has hurt someone else, then he quit talking to me and told me he couldn't talk to me. Before leaving the room I told him if he needed anything or just wanted to talk to let me know and he said he would and thanked me for being nice to him.

## 2019-06-17 NOTE — Progress Notes (Signed)
PROGRESS NOTE    Peter Parsons   [id number]  DOB: [birth year]  PCP: System, Provider Not In    DOA: 02/11/2019 LOS: 80   Brief Narrative   Peter Parsons is a 59 year old male with chronic schizophrenia with frequent suicidal ideations, bipolar disorder with depression, type 2 diabetes mellitus (controlled), CKD stage II, peripheral neuropathy, BPH, hypertension presented with altered mental status.  Prolonged hospital stay awaiting placement in a memory care unit.  Does not have capacity.   Foley catheter has been placed, removed and replaced and for recurrent urinary retention. Urology has recommended leaving catheter in place at discharge. He has had sepsis due to CAUTI. Treated with pressors and ampicillin for enterococcus faecalis UTI and bacteremia. He has completed the course. There was gross hematuria associated with UTI which resulted in acute blood loss anemia that was symptomatic with hypotension. The patient received 1 unit of PRBC's in transfusion and was transferred to a stepdown unit. He was found to have head lice on [id number] and was treated with permethrin cream.     Assessment & Plan   Principal Problem:   Enterococcal bacteremia Active Problems:   Paranoid schizophrenia ([place])   Altered mental status   Dementia ([place])   Weakness   Acute metabolic encephalopathy   Protein-calorie malnutrition, severe   Pressure injury of skin   Hypertension   Type 2 diabetes mellitus (HCC)   Dyslipidemia   BPH (benign prostatic hyperplasia)   Normocytic anemia   Thrombocytopenia (HCC)   Acute urinary retention   Urethral trauma   UTI (urinary tract infection)  Failure to thrive Patient is much improved with medication changes, much less lethargic in the morning. Physical therapy worked with the patient and noted that they do not need to be involved anymore the nursing can ambulate him. Nursing order placed to ambulate patient every shift.  He should be getting up out of  bed and walking around the unit as much as possible.  Chronic schizophreniaand Depression with bipolar disorder Appreciate Dr. Satira Sark coming by to see patient and his recommendations. Unclear whether his stiffness and increased tone is also improved. QTC is 433 on [id number]  Head lice - present on admission - resolved per prior attending notes --Already been treated with permethrin cream 5%.  Pressure injury, stage III, on coccyx and bilateral heels not POA Patient has remained afebrile with no leukocytosis to suggest infection. --Full-thickness tissue loss, being followed by wound care which is appreciated.  BPH with urinary retention, now with chronic Foley Foley was placed by urology after having a traumatic urethral injury  Failed voiding trail, Coude Foley re-inserted on 4/13. Failed voiding trial 4/20 again Patient was able to void on his own on 4/22. Foley discontinued.  Constipation --Miralax BID  Fungal rash on his face after shaving of beard  --Keep area dry, continue Nystatin cream  Peripheral neuropathy --Continue gabapentin  Severe protein calorie malnutrition Remeron had been started to induce appetite however that was discontinued as noted above. Patient has had decreased p.o. intake over the past several days. --Continue nutrition supplement    Previous problems, now resolved:  Severe sepsis with septic shock. Resolved. Secondary to catheter associated UTI. Patient had multiple straight caths done past few days and Foley placed in on 4/1. Transferred to stepdown unit and multiple IV normal saline bolus received. Required Neo-Synephrine overnight and now stable off pressors. Sepsis resolved. Blood and urine culture growing Enterococcus faecalis. Antibiotic narrowed to ampicillin -completed course.  Hypokalemia/hypomagnesemia, Resolved. Replete  electrolytes as needed and monitor.  Gross hematuria, resolved Secondary to traumatic  catheterization.Foley removed and urology placed in a 20 [place] coud Foley without resistance. Recommend intermittent irrigation, keep Foley upon discharge and follow-up with urology in [place]. Urine now clear. Received 1 unit PRBC for drop in H&H.  AKI, resolved chronic kidney disease stage II Likely ATN with sepsis + bladder outlet obstruction.   Type 2 diabetes mellitus, controlled A1c of 4.5. CBG stable.  Thrombocytopenia, resolved Unclear etiology.Possibly from acute illness.  Hypertension/ hyperlipidemia.  BP within goal. Off all blood pressure meds. Continue statin.  Dysphagia/dysarthria and left facial droop MRI negative for CVA. Continue dysphagia diet  Patient BMI: Body mass index is 25.98 kg/m.   DVT prophylaxis: SCD's  Diet:  Diet Orders (From admission, onward)    Start     Ordered   04/02/19 1459  DIET - DYS 1 Room service appropriate? Yes with Assist; Fluid consistency: Thin  Diet effective now    Comments: Extra gravy on meats, potatoes. Cream Soups, puddings, ice creams, Yogurts TID meals. Pt may have Oatmeal per speech.  Question Answer Comment  Room service appropriate? Yes with Assist   Fluid consistency: Thin      04/02/19 1459            Code Status: Full Code    Subjective 06/17/19    Patient sitting up in bed when seen this AM.  He states he is "in hell", reports having bad nightmares.  Says these are chronic for him.  Denies fever/chills, N/V/D or other complaints.   Disposition Plan & Communication   Status is: Inpatient  Remains inpatient appropriate because:he requires placment at a memory care unit for which placement in pending.   Dispo: The patient is from: Group home              Anticipated d/c is to: SNF              Anticipated d/c date is: > 3 days              Patient currently is medically stable to d/c.   Family Communication: none at bedside, will attempt to call      Objective   Vitals:     06/16/19 1644 06/16/19 1945 06/17/19 0506 06/17/19 0837  BP: 113/81 112/66 115/69 116/80  Pulse: 68 73 (!) 50 61  Resp: 16 18 16 16   Temp:  98.9 F (37.2 C) 97.6 F (36.4 C) 97.6 F (36.4 C)  TempSrc:  Oral Oral Oral  SpO2: 98% 94% 98% 100%  Weight:      Height:        Intake/Output Summary (Last 24 hours) at 06/17/2019 1013 Last data filed at 06/17/2019 0900 Gross per 24 hour  Intake 477 ml  Output --  Net 477 ml   Filed Weights   05/25/19 0500 06/01/19 0500 06/15/19 0500  Weight: 83.5 kg 82.4 kg 79.8 kg    Physical Exam:  General exam: awake, alert, no acute distress Respiratory system: CTAB, normal respiratory effort. Cardiovascular system: normal S1/S2, RRR, no pedal edema.   Extremities: offloading boots on b/l feet, moves all, no edema Psychiatry: normal mood, flat affect  Labs   Data Reviewed: I have personally reviewed following labs and imaging studies  CBC: Recent Labs  Lab 06/12/19 0459 06/13/19 0522  WBC 4.8 4.6  HGB 11.0* 10.9*  HCT 31.8* 31.4*  MCV 87.6 87.5  PLT 141* [id number]   Basic Metabolic Panel:  Recent Labs  Lab 06/12/19 0459 06/13/19 0522  NA 141 142  K 3.3* 3.3*  CL 99 101  CO2 31 34*  GLUCOSE 95 98  BUN 48* 46*  CREATININE 1.07 1.00  CALCIUM 9.4 9.5   GFR: Estimated Creatinine Clearance: 77.6 mL/min (by C-G formula based on SCr of 1 mg/dL). Liver Function Tests: No results for input(s): AST, ALT, ALKPHOS, BILITOT, PROT, ALBUMIN in the last 168 hours. No results for input(s): LIPASE, AMYLASE in the last 168 hours. No results for input(s): AMMONIA in the last 168 hours. Coagulation Profile: No results for input(s): INR, PROTIME in the last 168 hours. Cardiac Enzymes: No results for input(s): CKTOTAL, CKMB, CKMBINDEX, TROPONINI in the last 168 hours. BNP (last 3 results) No results for input(s): PROBNP in the last 8760 hours. HbA1C: No results for input(s): HGBA1C in the last 72 hours. CBG: No results for input(s): GLUCAP in  the last 168 hours. Lipid Profile: No results for input(s): CHOL, HDL, LDLCALC, TRIG, CHOLHDL, LDLDIRECT in the last 72 hours. Thyroid Function Tests: No results for input(s): TSH, T4TOTAL, FREET4, T3FREE, THYROIDAB in the last 72 hours. Anemia Panel: No results for input(s): VITAMINB12, FOLATE, FERRITIN, TIBC, IRON, RETICCTPCT in the last 72 hours. Sepsis Labs: No results for input(s): PROCALCITON, LATICACIDVEN in the last 168 hours.  No results found for this or any previous visit (from the past 240 hour(s)).    Imaging Studies   No results found.   Medications   Scheduled Meds: . atorvastatin  10 mg Oral Daily  . chlorhexidine  15 mL Mouth/Throat BID  . escitalopram  10 mg Oral Daily  . feeding supplement (NEPRO CARB STEADY)  237 mL Oral TID BM  . feeding supplement (PRO-STAT SUGAR FREE 64)  30 mL Oral BID  . gabapentin  300 mg Oral TID  . multivitamin with minerals  1 tablet Oral Daily  . nystatin cream   Topical BID  . polyethylene glycol  17 g Oral BID  . senna-docusate  2 tablet Oral BID  . sodium chloride flush  10-40 mL Intracatheter Q12H  . tamsulosin  0.4 mg Oral Daily  . traZODone  100 mg Oral QHS  . ziprasidone  60 mg Oral BID WC   Continuous Infusions: . sodium chloride 250 mL (05/23/19 0309)       LOS: 80 days    Time spent: 20 minutes    Ezekiel Slocumb, DO [facility]  06/17/2019, 10:13 AM    If 7PM-7AM, please contact night-coverage. How to contact the [facility] Attending or Consulting provider [place] or covering provider during after hours [place], for this patient?    1. Check the care team in [facility] and look for a) attending/consulting TRH provider listed and b) the [facility] team listed 2. Log into [email or web address] and use [place]'s universal password to access. If you do not have the password, please contact the hospital operator. 3. Locate the [facility] provider you are looking for under [facility] and page to a number that you can be  directly reached. 4. If you still have difficulty reaching the provider, please page the [facility] (Director on Call) for the Hospitalists listed on amion for assistance.

## 2019-06-18 MED ORDER — POTASSIUM CHLORIDE CRYS ER 20 MEQ PO TBCR
40.0000 meq | EXTENDED_RELEASE_TABLET | ORAL | Status: AC
  Administered 2019-06-18 (×2): 40 meq via ORAL
  Filled 2019-06-18 (×2): qty 2

## 2019-06-18 NOTE — Progress Notes (Signed)
PROGRESS NOTE    Peter Parsons   [id number]  DOB: 08/15/1960  PCP: System, Provider Not In    DOA: 02/11/2019 LOS: 36   Brief Narrative   Peter Parsons is a 59 year old male with chronic schizophrenia with frequent suicidal ideations, bipolar disorder with depression, type 2 diabetes mellitus (controlled), CKD stage II, peripheral neuropathy, BPH, hypertension presented with altered mental status.  Prolonged hospital stay awaiting placement in a memory care unit.  Does not have capacity.   Foley catheter has been placed, removed and replaced and for recurrent urinary retention. Urology has recommended leaving catheter in place at discharge. He has had sepsis due to CAUTI. Treated with pressors and ampicillin for enterococcus faecalis UTI and bacteremia. He has completed the course. There was gross hematuria associated with UTI which resulted in acute blood loss anemia that was symptomatic with hypotension. The patient received 1 unit of PRBC's in transfusion and was transferred to a stepdown unit. He was found to have head lice on [id number] and was treated with permethrin cream.     Assessment & Plan   Principal Problem:   Enterococcal bacteremia Active Problems:   Paranoid schizophrenia ([place])   Altered mental status   Dementia ([place])   Weakness   Acute metabolic encephalopathy   Protein-calorie malnutrition, severe   Pressure injury of skin   Hypertension   Type 2 diabetes mellitus (HCC)   Dyslipidemia   BPH (benign prostatic hyperplasia)   Normocytic anemia   Thrombocytopenia (HCC)   Acute urinary retention   Urethral trauma   UTI (urinary tract infection)  Failure to thrive Patient is much improved with medication changes, much less lethargic in the morning. Physical therapy worked with the patient and noted that they do not need to be involved anymore the nursing can ambulate him. Nursing order placed to ambulate patient every shift.  He should be getting up out of  bed and walking around the unit as much as possible.  Chronic schizophreniaand Depression with bipolar disorder Appreciate Dr. Satira Sark coming by to see patient and his recommendations. Unclear whether his stiffness and increased tone is also improved. QTC is 433 on [id number]  Head lice - present on admission - resolved per prior attending notes --Already been treated with permethrin cream 5%.  Pressure injury, stage III, on coccyx and bilateral heels not POA Patient has remained afebrile with no leukocytosis to suggest infection. --Full-thickness tissue loss, being followed by wound care which is appreciated.  Pressure Injury 04/12/19 Sacrum Mid Stage 2 -  Partial thickness loss of dermis presenting as a shallow open injury with a red, pink wound bed without slough. has evolved to stage 2 when assessed on 5/3 (Active)  04/12/19 1248 (noted after giving bath)  Location: Sacrum  Location Orientation: Mid  Staging: Stage 2 -  Partial thickness loss of dermis presenting as a shallow open injury with a red, pink wound bed without slough.  Wound Description (Comments): has evolved to stage 2 when assessed on 5/3  Present on Admission: No     Pressure Injury 04/15/19 Heel Left Stage 2 -  Partial thickness loss of dermis presenting as a shallow open injury with a red, pink wound bed without slough. evolved to stage 2 pressure injury when assessed on 5/3 (Active)  04/15/19 2024  Location: Heel  Location Orientation: Left  Staging: Stage 2 -  Partial thickness loss of dermis presenting as a shallow open injury with a red, pink wound bed without slough.  Wound Description (  Comments): evolved to stage 2 pressure injury when assessed on 5/3  Present on Admission: No    BPH with urinary retention, now with chronic Foley Foley was placed by urology after having a traumatic urethral injury  Failed voiding trail, Coude Foley re-inserted on 4/13. Failed voiding trial 4/20 again Patient was able  to void on his own on 4/22. Foley discontinued.  Constipation --Miralax BID  Fungal rash on his face after shaving of beard  --Keep area dry, continue Nystatin cream  Peripheral neuropathy --Continue gabapentin  Severe protein calorie malnutrition Remeron had been started to induce appetite however that was discontinued as noted above. Patient has had decreased p.o. intake but improving. --Continue nutrition supplement    Previous problems, now resolved:  Severe sepsis with septic shock. Resolved. Secondary to catheter associated UTI. Patient had multiple straight caths done past few days and Foley placed in on 4/1. Transferred to stepdown unit and multiple IV normal saline bolus received. Required Neo-Synephrine overnight and now stable off pressors. Sepsis resolved. Blood and urine culture growing Enterococcus faecalis. Antibiotic narrowed to ampicillin -completed course.  Hypokalemia/hypomagnesemia, Resolved. Replete electrolytes as needed and monitor.  Gross hematuria, resolved Secondary to traumatic catheterization.Foley removed and urology placed in a 20 [place] coud Foley without resistance. Recommend intermittent irrigation, keep Foley upon discharge and follow-up with urology in [place]. Urine now clear. Received 1 unit PRBC for drop in H&H.  AKI, resolved chronic kidney disease stage II Likely ATN with sepsis + bladder outlet obstruction.   Type 2 diabetes mellitus, controlled A1c of 4.5. CBG stable.  Thrombocytopenia, resolved Unclear etiology.Possibly from acute illness.  Hypertension/ hyperlipidemia.  BP within goal. Off all blood pressure meds. Continue statin.  Dysphagia/dysarthria and left facial droop MRI negative for CVA. Continue dysphagia diet  Patient BMI: Body mass index is 25.98 kg/m.   DVT prophylaxis: SCD's  Diet:  Diet Orders (From admission, onward)    Start     Ordered   04/02/19 1459  DIET - DYS 1  Room service appropriate? Yes with Assist; Fluid consistency: Thin  Diet effective now    Comments: Extra gravy on meats, potatoes. Cream Soups, puddings, ice creams, Yogurts TID meals. Pt may have Oatmeal per speech.  Question Answer Comment  Room service appropriate? Yes with Assist   Fluid consistency: Thin      04/02/19 1459            Code Status: Full Code    Subjective 06/18/19    Patient sitting up in bed when seen this AM.  He says he's found a place to live, that his parents have rested and he can go home with them.  He then dialed the phone to call his father.  He seems to lack insight into the amount of assistance he requires.  Denies fever/chills, N/V/D or other complaints.  Denied having nightmare last night.     Disposition Plan & Communication   Status is: Inpatient  Remains inpatient appropriate because: he requires placement in a SNF with locked unit, pending placement.  Group home unable to care for him at current level of assistance needed.    Dispo: The patient is from: Group home              Anticipated d/c is to: SNF              Anticipated d/c date is: > 3 days              Patient currently  is medically stable to d/c.   Family Communication: none at bedside, will attempt to call      Objective   Vitals:   06/17/19 0837 06/17/19 1630 06/17/19 2333 06/18/19 0801  BP: 116/80 138/74 (!) 117/91 116/73  Pulse: 61 70 73 67  Resp: 16 16 15 20   Temp: 97.6 F (36.4 C) 98.1 F (36.7 C) 98.1 F (36.7 C) (!) 97.5 F (36.4 C)  TempSrc: Oral Oral Oral Oral  SpO2: 100% 95% 99% 98%  Weight:      Height:        Intake/Output Summary (Last 24 hours) at 06/18/2019 1214 Last data filed at 06/18/2019 1047 Gross per 24 hour  Intake 120 ml  Output 725 ml  Net -605 ml   Filed Weights   05/25/19 0500 06/01/19 0500 06/15/19 0500  Weight: 83.5 kg 82.4 kg 79.8 kg    Physical Exam:  General exam: awake, alert, no acute distress Respiratory system: CTAB,  normal respiratory effort. Cardiovascular system: normal S1/S2, RRR, no pedal edema.   Extremities: offloading boots on b/l feet, moves all, no edema Psychiatry: normal mood, flat affect  Labs   Data Reviewed: I have personally reviewed following labs and imaging studies  CBC: Recent Labs  Lab 06/12/19 0459 06/13/19 0522  WBC 4.8 4.6  HGB 11.0* 10.9*  HCT 31.8* 31.4*  MCV 87.6 87.5  PLT 141* [id number]   Basic Metabolic Panel: Recent Labs  Lab 06/12/19 0459 06/13/19 0522  NA 141 142  K 3.3* 3.3*  CL 99 101  CO2 31 34*  GLUCOSE 95 98  BUN 48* 46*  CREATININE 1.07 1.00  CALCIUM 9.4 9.5   GFR: Estimated Creatinine Clearance: 77.6 mL/min (by C-G formula based on SCr of 1 mg/dL). Liver Function Tests: No results for input(s): AST, ALT, ALKPHOS, BILITOT, PROT, ALBUMIN in the last 168 hours. No results for input(s): LIPASE, AMYLASE in the last 168 hours. No results for input(s): AMMONIA in the last 168 hours. Coagulation Profile: No results for input(s): INR, PROTIME in the last 168 hours. Cardiac Enzymes: No results for input(s): CKTOTAL, CKMB, CKMBINDEX, TROPONINI in the last 168 hours. BNP (last 3 results) No results for input(s): PROBNP in the last 8760 hours. HbA1C: No results for input(s): HGBA1C in the last 72 hours. CBG: No results for input(s): GLUCAP in the last 168 hours. Lipid Profile: No results for input(s): CHOL, HDL, LDLCALC, TRIG, CHOLHDL, LDLDIRECT in the last 72 hours. Thyroid Function Tests: No results for input(s): TSH, T4TOTAL, FREET4, T3FREE, THYROIDAB in the last 72 hours. Anemia Panel: No results for input(s): VITAMINB12, FOLATE, FERRITIN, TIBC, IRON, RETICCTPCT in the last 72 hours. Sepsis Labs: No results for input(s): PROCALCITON, LATICACIDVEN in the last 168 hours.  No results found for this or any previous visit (from the past 240 hour(s)).    Imaging Studies   No results found.   Medications   Scheduled Meds: . atorvastatin  10 mg  Oral Daily  . chlorhexidine  15 mL Mouth/Throat BID  . escitalopram  10 mg Oral Daily  . feeding supplement (NEPRO CARB STEADY)  237 mL Oral TID BM  . feeding supplement (PRO-STAT SUGAR FREE 64)  30 mL Oral BID  . gabapentin  300 mg Oral TID  . multivitamin with minerals  1 tablet Oral Daily  . nystatin cream   Topical BID  . polyethylene glycol  17 g Oral BID  . potassium chloride  40 mEq Oral Q4H  . senna-docusate  2  tablet Oral BID  . sodium chloride flush  10-40 mL Intracatheter Q12H  . tamsulosin  0.4 mg Oral Daily  . traZODone  50 mg Oral QHS  . ziprasidone  60 mg Oral BID   Continuous Infusions: . sodium chloride 250 mL (05/23/19 0309)       LOS: 81 days    Time spent: 20 minutes    Ezekiel Slocumb, DO [facility]  06/18/2019, 12:14 PM    If 7PM-7AM, please contact night-coverage. How to contact the [facility] Attending or Consulting provider [place] or covering provider during after hours [place], for this patient?    1. Check the care team in [facility] and look for a) attending/consulting TRH provider listed and b) the [facility] team listed 2. Log into [email or web address] and use [place]'s universal password to access. If you do not have the password, please contact the hospital operator. 3. Locate the [facility] provider you are looking for under [facility] and page to a number that you can be directly reached. 4. If you still have difficulty reaching the provider, please page the [facility] (Director on Call) for the Hospitalists listed on amion for assistance.

## 2019-06-19 LAB — BASIC METABOLIC PANEL
Anion gap: 8 (ref 5–15)
BUN: 35 mg/dL — ABNORMAL HIGH (ref 8–23)
CO2: 32 mmol/L (ref 22–32)
Calcium: 9.3 mg/dL (ref 8.9–10.3)
Chloride: 102 mmol/L (ref 98–111)
Creatinine, Ser: 0.98 mg/dL (ref 0.61–1.24)
GFR calc Af Amer: >60 mL/min (ref >60)
GFR calc non Af Amer: >60 mL/min (ref >60)
Glucose, Bld: 92 mg/dL (ref 70–99)
Potassium: 3.7 mmol/L (ref 3.5–5.1)
Sodium: 142 mmol/L (ref 135–145)

## 2019-06-19 MED ORDER — HALOPERIDOL LACTATE 5 MG/ML IJ SOLN
2.0000 mg | INTRAMUSCULAR | Status: DC | PRN
  Administered 2019-06-20 (×2): 2 mg via INTRAVENOUS
  Filled 2019-06-19 (×2): qty 1

## 2019-06-19 NOTE — Progress Notes (Signed)
PROGRESS NOTE    [name]   [id number]  DOB: [birth year]  PCP: System, Provider Not In    DOA: 02/11/2019 LOS: 42   Brief Narrative   Peter Parsons is a 59 year old male with chronic schizophrenia with frequent suicidal ideations, bipolar disorder with depression, type 2 diabetes mellitus (controlled), CKD stage II, peripheral neuropathy, BPH, hypertension presented with altered mental status.  Prolonged hospital stay awaiting SNF placement in a secured unit.  Does not have capacity for decision-making, has guardian.   Foley catheter had been placed, removed and replaced and for recurrent urinary retention. Urology had recommended leaving catheter in place at discharge, however patient was able to void on 4/22 and Foley has been discontinued since that time. He has had sepsis due to CAUTI. Treated with pressors and ampicillin for enterococcus faecalis UTI and bacteremia. He has completed the course. There was gross hematuria associated with UTI which resulted in acute blood loss anemia that was symptomatic with hypotension. The patient received 1 unit of PRBC's in transfusion and was transferred to a stepdown unit. He was found to have head lice on [id number] and was treated with permethrin cream.     Assessment & Plan   Principal Problem:   Enterococcal bacteremia Active Problems:   Paranoid schizophrenia ([place])   Altered mental status   Dementia ([place])   Weakness   Acute metabolic encephalopathy   Protein-calorie malnutrition, severe   Pressure injury of skin   Hypertension   Type 2 diabetes mellitus (HCC)   Dyslipidemia   BPH (benign prostatic hyperplasia)   Normocytic anemia   Thrombocytopenia (HCC)   Acute urinary retention   Urethral trauma   UTI (urinary tract infection)   Failure to thrive Patientis muchimproved with medication changes, much less lethargic in the morning. Physical therapy worked with the patient and noted that they do not need to be involved  anymore the nursing can ambulate him. Nursing order placed to ambulate patient every shift. He should be getting up out of bed and walking around the unit as much as possible.  Chronic schizophreniaand Depression with bipolar disorder Appreciate Dr. Satira Sark coming by to see patient and his recommendations. Unclear whether his stiffness and increased tone is also improved. QTC is 433 on [id number]  Head lice - present on admission - resolved per prior attending notes --Already been treated with permethrin cream 5%.  Pressure injury, stage III, on coccyx and bilateral heels not POA Patient has remained afebrile with no leukocytosis to suggest infection. --Full-thickness tissue loss, being followed by wound care which is appreciated.  Pressure Injury 04/12/19 Sacrum Mid Stage 2 -  Partial thickness loss of dermis presenting as a shallow open injury with a red, pink wound bed without slough. has evolved to stage 2 when assessed on 5/3 (Active)  04/12/19 1248 (noted after giving bath)  Location: Sacrum  Location Orientation: Mid  Staging: Stage 2 -  Partial thickness loss of dermis presenting as a shallow open injury with a red, pink wound bed without slough.  Wound Description (Comments): has evolved to stage 2 when assessed on 5/3  Present on Admission: No     Pressure Injury 04/15/19 Heel Left Stage 2 -  Partial thickness loss of dermis presenting as a shallow open injury with a red, pink wound bed without slough. evolved to stage 2 pressure injury when assessed on 5/3 (Active)  04/15/19 2024  Location: Heel  Location Orientation: Left  Staging: Stage 2 -  Partial thickness loss of  dermis presenting as a shallow open injury with a red, pink wound bed without slough.  Wound Description (Comments): evolved to stage 2 pressure injury when assessed on 5/3  Present on Admission: No    BPH with urinary retention, now with chronic Foley Foley was placed by urology after having a traumatic  urethral injury  Failed voiding trail, Coude Foley re-inserted on 4/13. Failed voiding trial 4/20 again Patient was able to void on his own on 4/22. Foley discontinued.  Constipation --Miralax BID  Fungal rash on his face after shaving of beard  --Keep area dry, continue Nystatin cream  Peripheral neuropathy --Continue gabapentin  Severe protein calorie malnutrition Remeron had been started to induce appetite however that was discontinued as noted above. Patient has had decreased p.o. intake but improving. --Continue nutrition supplement    Previous problems, now resolved:  Severe sepsis with septic shock. Resolved. Secondary to catheter associated UTI. Patient had multiple straight caths done past few days and Foley placed in on 4/1. Transferred to stepdown unit and multiple IV normal saline bolus received. Required Neo-Synephrine overnight and now stable off pressors. Sepsis resolved. Blood and urine culture growing Enterococcus faecalis. Antibiotic narrowed to ampicillin -completed course.  Hypokalemia/hypomagnesemia, Resolved. Replete electrolytes as needed and monitor.  Gross hematuria, resolved Secondary to traumatic catheterization.Foley removed and urology placed in a 20 [place] coud Foley without resistance. Recommend intermittent irrigation, keep Foley upon discharge and follow-up with urology in [place]. Urine now clear. Received 1 unit PRBC for drop in H&H.  AKI, resolved chronic kidney disease stage II Likely ATN with sepsis + bladder outlet obstruction.   Type 2 diabetes mellitus, controlled A1c of 4.5. CBG stable.  Thrombocytopenia, resolved Unclear etiology.Possibly from acute illness.  Hypertension/ hyperlipidemia.  BP within goal. Off all blood pressure meds. Continue statin.  Dysphagia/dysarthria and left facial droop MRI negative for CVA. Continue dysphagia diet  Patient BMI: Body mass index is 25.98 kg/m.     DVT prophylaxis: SCD's  Diet:  Diet Orders (From admission, onward)    Start     Ordered   04/02/19 1459  DIET - DYS 1 Room service appropriate? Yes with Assist; Fluid consistency: Thin  Diet effective now    Comments: Extra gravy on meats, potatoes. Cream Soups, puddings, ice creams, Yogurts TID meals. Pt may have Oatmeal per speech.  Question Answer Comment  Room service appropriate? Yes with Assist   Fluid consistency: Thin      04/02/19 1459            Code Status: Full Code    Subjective 06/19/19    Patient seen at bedside.  No acute events reported.  He states he's not doing so good today.  Says physically he feels fine, but mentally is "a wreck".  Had nightmares again last night.  Denies fever/chills, abdominal pain, n/v/d, or sob.   Disposition Plan & Communication   Status is: Inpatient  Remains inpatient appropriate because:he requires SNF placement in secured/locked unit due to wandering tendencies.  Placement still pending at this time.  Prior group home unable to care for patient adequately.   Dispo: The patient is from: Group home              Anticipated d/c is to: SNF              Anticipated d/c date is: > 3 days              Patient currently is medically stable to d/c.  Family Communication: none at bedside, will attempt to contact guardian      Objective   Vitals:   06/17/19 1630 06/17/19 2333 06/18/19 0801 06/19/19 0015  BP: 138/74 (!) 117/91 116/73 103/64  Pulse: 70 73 67 (!) 54  Resp: 16 15 20 16   Temp: 98.1 F (36.7 C) 98.1 F (36.7 C) (!) 97.5 F (36.4 C) 98.4 F (36.9 C)  TempSrc: Oral Oral Oral Oral  SpO2: 95% 99% 98% 96%  Weight:      Height:        Intake/Output Summary (Last 24 hours) at 06/19/2019 0729 Last data filed at 06/18/2019 2300 Gross per 24 hour  Intake 478 ml  Output 200 ml  Net 278 ml   Filed Weights   05/25/19 0500 06/01/19 0500 06/15/19 0500  Weight: 83.5 kg 82.4 kg 79.8 kg    Physical Exam: General  exam: awake, alert, no acute distress Respiratory system: CTAB, normal respiratory effort. Cardiovascular system: normal S1/S2, RRR, no pedal edema.   Central nervous system: A&O x3. no gross focal neurologic deficits, normal speech Psychiatry: normal mood, flat affect, abnormal judgement and insight  Labs   Data Reviewed: I have personally reviewed following labs and imaging studies  CBC: Recent Labs  Lab 06/13/19 0522  WBC 4.6  HGB 10.9*  HCT 31.4*  MCV 87.5  PLT [id number]*   Basic Metabolic Panel: Recent Labs  Lab 06/13/19 0522 06/19/19 0615  NA 142 142  K 3.3* 3.7  CL 101 102  CO2 34* 32  GLUCOSE 98 92  BUN 46* 35*  CREATININE 1.00 0.98  CALCIUM 9.5 9.3   GFR: Estimated Creatinine Clearance: 79.2 mL/min (by C-G formula based on SCr of 0.98 mg/dL). Liver Function Tests: No results for input(s): AST, ALT, ALKPHOS, BILITOT, PROT, ALBUMIN in the last 168 hours. No results for input(s): LIPASE, AMYLASE in the last 168 hours. No results for input(s): AMMONIA in the last 168 hours. Coagulation Profile: No results for input(s): INR, PROTIME in the last 168 hours. Cardiac Enzymes: No results for input(s): CKTOTAL, CKMB, CKMBINDEX, TROPONINI in the last 168 hours. BNP (last 3 results) No results for input(s): PROBNP in the last 8760 hours. HbA1C: No results for input(s): HGBA1C in the last 72 hours. CBG: No results for input(s): GLUCAP in the last 168 hours. Lipid Profile: No results for input(s): CHOL, HDL, LDLCALC, TRIG, CHOLHDL, LDLDIRECT in the last 72 hours. Thyroid Function Tests: No results for input(s): TSH, T4TOTAL, FREET4, T3FREE, THYROIDAB in the last 72 hours. Anemia Panel: No results for input(s): VITAMINB12, FOLATE, FERRITIN, TIBC, IRON, RETICCTPCT in the last 72 hours. Sepsis Labs: No results for input(s): PROCALCITON, LATICACIDVEN in the last 168 hours.  No results found for this or any previous visit (from the past 240 hour(s)).    Imaging Studies    No results found.   Medications   Scheduled Meds: . atorvastatin  10 mg Oral Daily  . chlorhexidine  15 mL Mouth/Throat BID  . escitalopram  10 mg Oral Daily  . feeding supplement (NEPRO CARB STEADY)  237 mL Oral TID BM  . feeding supplement (PRO-STAT SUGAR FREE 64)  30 mL Oral BID  . gabapentin  300 mg Oral TID  . multivitamin with minerals  1 tablet Oral Daily  . nystatin cream   Topical BID  . polyethylene glycol  17 g Oral BID  . senna-docusate  2 tablet Oral BID  . sodium chloride flush  10-40 mL Intracatheter Q12H  . tamsulosin  0.4 mg Oral Daily  . traZODone  50 mg Oral QHS  . ziprasidone  60 mg Oral BID   Continuous Infusions: . sodium chloride 250 mL (05/23/19 0309)       LOS: 82 days    Time spent: 20 minutes    Ezekiel Slocumb, DO [facility]  06/19/2019, 7:29 AM    If 7PM-7AM, please contact night-coverage. How to contact the [facility] Attending or Consulting provider [place] or covering provider during after hours [place], for this patient?    1. Check the care team in [facility] and look for a) attending/consulting TRH provider listed and b) the [facility] team listed 2. Log into [email or web address] and use [place]'s universal password to access. If you do not have the password, please contact the hospital operator. 3. Locate the [facility] provider you are looking for under [facility] and page to a number that you can be directly reached. 4. If you still have difficulty reaching the provider, please page the [facility] (Director on Call) for the Hospitalists listed on amion for assistance.

## 2019-06-20 DIAGNOSIS — F0391 Unspecified dementia with behavioral disturbance: Secondary | ICD-10-CM

## 2019-06-20 NOTE — Progress Notes (Signed)
PROGRESS NOTE    Peter Parsons   [id number]  DOB: [birth year]  PCP: System, Provider Not In    DOA: 02/11/2019 LOS: 93   Brief Narrative   Peter Parsons is a 59 year old male with chronic schizophrenia with frequent suicidal ideations, bipolar disorder with depression, dementia, type 2 diabetes mellitus (controlled), CKD stage II, peripheral neuropathy, BPH, hypertension presented on 03/28/19 from the behavioral health unit where he had been admitted for 44 days prior to presenting to [place] with altered mental status, generalized weakness, dysphagia, slurred speech and left facial droop.  Stroke was ruled out by MRI, no significant stenosis on carotid dopplers, echo showed EF 50-55% without cardiac source of emboli.  Found B12 deficient and started on supplementation.    Of note, he initially presented on 02/11/19 with suicidal ideations and had wandered off into the woods.  He previously had multiple ED visits for injuries related to running away from his group home.  He'd also had an admission to [facility] in November 2020.  He was awaiting placement in the ED when he was noted to have above symptoms prompting this hospital admission for evaluation.  Psychiatry has been following patient and managing psych medications throughout admission.  Prolonged hospital stay due to social situation.  Patient previously lived with his parents who are both elderly and apparently with dementia and no longer able to care for the patient.  He has been awaiting SNF placement in a secured geri-psych unit.  Does not have capacity for decision-making, has guardian.     Assessment & Plan   Principal Problem:   Enterococcal bacteremia Active Problems:   Paranoid schizophrenia ([place])   Altered mental status   Dementia ([place])   Weakness   Acute metabolic encephalopathy   Protein-calorie malnutrition, severe   Pressure injury of skin   Hypertension   Type 2 diabetes mellitus (HCC)   Dyslipidemia   BPH (benign  prostatic hyperplasia)   Normocytic anemia   Thrombocytopenia (HCC)   Acute urinary retention   Urethral trauma   UTI (urinary tract infection)   Failure to thrive Patientis muchimproved with medication changes, much less lethargic in the morning. Physical therapy worked with the patient and noted that they do not need to be involved anymore the nursing can ambulate him. Nursing order placed to ambulate patient every shift. He should be getting up out of bed and walking around the unit as much as possible.  Chronic schizophreniaand Depression with bipolar disorder Appreciate Dr. Satira Sark coming by to see patient and his recommendations. Unclear whether his stiffness and increased tone is also improved. QTC is 433 on [id number]  Head lice - present on admission - resolved per prior attending notes --Already been treated with permethrin cream 5%.  Pressure injury, stage III, on coccyx and bilateral heels not POA Patient has remained afebrile with no leukocytosis to suggest infection. --Full-thickness tissue loss, being followed by wound care which is appreciated.  Pressure Injury 04/12/19 Sacrum Mid Stage 2 -  Partial thickness loss of dermis presenting as a shallow open injury with a red, pink wound bed without slough. has evolved to stage 2 when assessed on 5/3 (Active)  04/12/19 1248 (noted after giving bath)  Location: Sacrum  Location Orientation: Mid  Staging: Stage 2 -  Partial thickness loss of dermis presenting as a shallow open injury with a red, pink wound bed without slough.  Wound Description (Comments): has evolved to stage 2 when assessed on 5/3  Present on Admission: No  Pressure Injury 04/15/19 Heel Left Stage 2 -  Partial thickness loss of dermis presenting as a shallow open injury with a red, pink wound bed without slough. evolved to stage 2 pressure injury when assessed on 5/3 (Active)  04/15/19 2024  Location: Heel  Location Orientation: Left  Staging:  Stage 2 -  Partial thickness loss of dermis presenting as a shallow open injury with a red, pink wound bed without slough.  Wound Description (Comments): evolved to stage 2 pressure injury when assessed on 5/3  Present on Admission: No    BPH with urinary retention, now with chronic Foley Foley was placed by urology after having a traumatic urethral injury  Failed voiding trail, Coude Foley re-inserted on 4/13. Failed voiding trial 4/20 again Patient was able to void on his own on 4/22. Foley discontinued.  Constipation --Miralax BID  Fungal rash on his face after shaving of beard  --Keep area dry, continue Nystatin cream  Peripheral neuropathy --Continue gabapentin  Severe protein calorie malnutrition Remeron had been started to induce appetite however that was discontinued as noted above. Patient has had decreased p.o. intake but improving. --Continue nutrition supplement    Previous problems, now resolved:  Severe sepsis with septic shock. Resolved. Secondary to catheter associated UTI. Patient had multiple straight caths done past few days and Foley placed in on 4/1. Transferred to stepdown unit and multiple IV normal saline bolus received. Required Neo-Synephrine overnight and now stable off pressors. Sepsis resolved. Blood and urine culture growing Enterococcus faecalis. Antibiotic narrowed to ampicillin -completed course.  Hypokalemia/hypomagnesemia, Resolved. Replete electrolytes as needed and monitor.  Gross hematuria, resolved Secondary to traumatic catheterization.Foley removed and urology placed in a 20 [place] coud Foley without resistance. Recommend intermittent irrigation, keep Foley upon discharge and follow-up with urology in [place]. Urine now clear. Received 1 unit PRBC for drop in H&H.  AKI, resolved chronic kidney disease stage II Likely ATN with sepsis + bladder outlet obstruction.   Type 2 diabetes mellitus, controlled  A1c of 4.5. CBG stable.  Thrombocytopenia, resolved Unclear etiology.Possibly from acute illness.  Hypertension/ hyperlipidemia.  BP within goal. Off all blood pressure meds. Continue statin.  Dysphagia/dysarthria and left facial droop MRI negative for CVA. Continue dysphagia diet  Patient BMI: Body mass index is 25.98 kg/m.   DVT prophylaxis: SCD's  Diet:  Diet Orders (From admission, onward)    Start     Ordered   04/02/19 1459  DIET - DYS 1 Room service appropriate? Yes with Assist; Fluid consistency: Thin  Diet effective now    Comments: Extra gravy on meats, potatoes. Cream Soups, puddings, ice creams, Yogurts TID meals. Pt may have Oatmeal per speech.  Question Answer Comment  Room service appropriate? Yes with Assist   Fluid consistency: Thin      04/02/19 1459            Code Status: Full Code    Subjective 06/20/19    Patient seen at bedside.  He is sleeping comfortably after being given haldol this AM for persistent agitation since last night.  He reportedly did okay overnight with sitter, but this morning became impulsive, agitated and required medication.  No other events or concerns.  Patient in no distress.   Disposition Plan & Communication   Status is: Inpatient  Remains inpatient appropriate because:he requires SNF placement in secured/locked unit due to wandering tendencies.  Placement still pending at this time.  Prior group home unable to care for patient adequately.   Dispo:  The patient is from: Group home              Anticipated d/c is to: SNF              Anticipated d/c date is: > 3 days              Patient currently is medically stable to d/c.   Family Communication: none at bedside, will attempt to contact guardian     Significant Events:   Foley catheter had been placed, removed and replaced and for recurrent urinary retention. Urology had recommended leaving catheter in place at discharge, however patient was able to  void on 4/22 and Foley has been discontinued since that time.   Has recovered from sepsis due to CAUTI and enterococcus bacteremia, required treatment with pressors in stepdown unit, completed course of Ampicillin.  Gross hematuria associated with UTI >> acute blood loss anemia, was symptomatic with hypotension and received 1 unit of PRBC transfusion.    Found to have head lice on [id number] and was treated with permethrin cream.   Objective   Vitals:   06/19/19 0015 06/19/19 0741 06/19/19 1546 06/20/19 0015  BP: 103/64 121/85 135/86 (!) 132/91  Pulse: (!) 54 64 64 64  Resp: 16 17 18 17   Temp: 98.4 F (36.9 C) 97.7 F (36.5 C) 98.5 F (36.9 C) 98.3 F (36.8 C)  TempSrc: Oral Oral Oral Oral  SpO2: 96% 99% 99% 99%  Weight:      Height:        Intake/Output Summary (Last 24 hours) at 06/20/2019 0732 Last data filed at 06/19/2019 2359 Gross per 24 hour  Intake 840 ml  Output 675 ml  Net 165 ml   Filed Weights   05/25/19 0500 06/01/19 0500 06/15/19 0500  Weight: 83.5 kg 82.4 kg 79.8 kg    Physical Exam: General exam: sleeping comfortably, no acute distress Respiratory system: CTAB, normal respiratory effort. Cardiovascular system: normal S1/S2, RRR, no pedal edema.   Psychiatry: normal mood, flat affect, abnormal judgement and insight  Labs   Data Reviewed: I have personally reviewed following labs and imaging studies  CBC: No results for input(s): WBC, NEUTROABS, HGB, HCT, MCV, PLT in the last 168 hours. Basic Metabolic Panel: Recent Labs  Lab 06/19/19 0615  NA 142  K 3.7  CL 102  CO2 32  GLUCOSE 92  BUN 35*  CREATININE 0.98  CALCIUM 9.3   GFR: Estimated Creatinine Clearance: 79.2 mL/min (by C-G formula based on SCr of 0.98 mg/dL). Liver Function Tests: No results for input(s): AST, ALT, ALKPHOS, BILITOT, PROT, ALBUMIN in the last 168 hours. No results for input(s): LIPASE, AMYLASE in the last 168 hours. No results for input(s): AMMONIA in the last 168 hours.  Coagulation Profile: No results for input(s): INR, PROTIME in the last 168 hours. Cardiac Enzymes: No results for input(s): CKTOTAL, CKMB, CKMBINDEX, TROPONINI in the last 168 hours. BNP (last 3 results) No results for input(s): PROBNP in the last 8760 hours. HbA1C: No results for input(s): HGBA1C in the last 72 hours. CBG: No results for input(s): GLUCAP in the last 168 hours. Lipid Profile: No results for input(s): CHOL, HDL, LDLCALC, TRIG, CHOLHDL, LDLDIRECT in the last 72 hours. Thyroid Function Tests: No results for input(s): TSH, T4TOTAL, FREET4, T3FREE, THYROIDAB in the last 72 hours. Anemia Panel: No results for input(s): VITAMINB12, FOLATE, FERRITIN, TIBC, IRON, RETICCTPCT in the last 72 hours. Sepsis Labs: No results for input(s): PROCALCITON, LATICACIDVEN in the last  168 hours.  No results found for this or any previous visit (from the past 240 hour(s)).    Imaging Studies   No results found.   Medications   Scheduled Meds: . atorvastatin  10 mg Oral Daily  . chlorhexidine  15 mL Mouth/Throat BID  . escitalopram  10 mg Oral Daily  . feeding supplement (NEPRO CARB STEADY)  237 mL Oral TID BM  . feeding supplement (PRO-STAT SUGAR FREE 64)  30 mL Oral BID  . gabapentin  300 mg Oral TID  . multivitamin with minerals  1 tablet Oral Daily  . nystatin cream   Topical BID  . polyethylene glycol  17 g Oral BID  . senna-docusate  2 tablet Oral BID  . sodium chloride flush  10-40 mL Intracatheter Q12H  . tamsulosin  0.4 mg Oral Daily  . traZODone  50 mg Oral QHS  . ziprasidone  60 mg Oral BID   Continuous Infusions: . sodium chloride 250 mL (05/23/19 0309)       LOS: 83 days    Time spent: 30 minutes total with >50% spent in coordination of care and in direct patient contact    Ezekiel Slocumb, DO [facility]  06/20/2019, 7:32 AM    If 7PM-7AM, please contact night-coverage. How to contact the [facility] Attending or Consulting provider [place] or  covering provider during after hours [place], for this patient?    1. Check the care team in [facility] and look for a) attending/consulting TRH provider listed and b) the [facility] team listed 2. Log into [email or web address] and use [place]'s universal password to access. If you do not have the password, please contact the hospital operator. 3. Locate the [facility] provider you are looking for under [facility] and page to a number that you can be directly reached. 4. If you still have difficulty reaching the provider, please page the [facility] (Director on Call) for the Hospitalists listed on amion for assistance.

## 2019-06-21 ENCOUNTER — Inpatient Hospital Stay: Payer: Medicaid Other

## 2019-06-21 NOTE — Progress Notes (Signed)
Pt denies any thoughts of self harm. Pt does states he currently has voices speaking to him. He declines to speak on what voices are speaking to. This RN specifically asked if voices were being kind to him. Pt's reply was that the voices were being "fairly" kind to him.  Pt states he will share further should these voices tell him to commit self harm or harm to others.

## 2019-06-21 NOTE — Progress Notes (Signed)
Nutrition Follow-up  DOCUMENTATION CODES:   Severe malnutrition in context of social or environmental circumstances  INTERVENTION:  ContinueNepro Shake poTID, each supplement provides 425 kcal and 19 grams protein.  Continue Pro-Stat 30 mL po BID, each supplement provides 100 kcal and 15 grams of protein.  Continue daily MVI.  NUTRITION DIAGNOSIS:   Severe Malnutrition related to social / environmental circumstances(inadequate oral intake) as evidenced by percent weight loss, moderate fat depletion, moderate muscle depletion, severe muscle depletion.  Ongoing.  GOAL:   Patient will meet greater than or equal to 90% of their needs  Progressing.  MONITOR:   PO intake, Supplement acceptance, Labs, Weight trends, I & O's  REASON FOR ASSESSMENT:   Malnutrition Screening Tool    ASSESSMENT:   59 year old male with PMHx of schizophrenia, HTN, DM who has been in [place] for 44 days and is now admitted with generalized weakness, dysphagia, slurred speech, left facial droop to rule out CVA.  Met with patient at bedside. He was sitting up and appeared more alert. He reports his appetite has improved some. He is now eating 25-100% of his meals (average 71%). He reports he continues to drink Nepro and Pro-Stat.  Medications reviewed and include: MVI daily, Miralax 17 grams BID, senna-docusate 2 tablets BID.  Labs reviewed.  Diet Order:   Diet Order            DIET - DYS 1 Room service appropriate? Yes with Assist; Fluid consistency: Thin  Diet effective now             EDUCATION NEEDS:   No education needs have been identified at this time  Skin:  Skin Assessment: Skin Integrity Issues:(unstageable sacrum (1cm x 1cm); DTI left heel (3cm x 2cm); DTI right heel (0.5cm x 1cm))  Last BM:  06/20/2019  Height:   Ht Readings from Last 1 Encounters:  03/28/19 5' 9"  (1.753 m)   Weight:   Wt Readings from Last 1 Encounters:  06/15/19 79.8 kg   Ideal Body Weight:  72.7  kg  BMI:  Body mass index is 25.98 kg/m.  Estimated Nutritional Needs:   Kcal:  2000-2200  Protein:  100-110 grams  Fluid:  >/= 2 L/day  Jacklynn Barnacle, MS, RD, LDN Pager number available on Amion

## 2019-06-21 NOTE — Progress Notes (Addendum)
   06/21/19 2248  What Happened  Was fall witnessed? No  Patients activity before fall bathroom-unassisted (pt attempting to get to [facility])  Point of contact buttocks;back  Was patient injured? No  Patient found on floor  Found by Staff-comment Frederich Cha)  Stated prior activity ambulating-unassisted  Follow Up  MD notified B. Randol Kern, NP  Time MD notified [phone]  Family notified No - patient refusal (Pt refused stating it may be better if I did it (call them)")  Simple treatment  (pt assessed )  Adult Fall Risk Assessment  Risk Factor Category (scoring not indicated) History of more than one fall within 6 months before admission (document High fall risk);High fall risk per protocol (document High fall risk)  Patient Fall Risk Level High fall risk  Adult Fall Risk Interventions  Required Bundle Interventions *See Row Information* High fall risk - low, moderate, and high requirements implemented  Additional Interventions Use of appropriate toileting equipment (bedpan, BSC, etc.);Room near nurses station  Screening for Fall Injury Risk (To be completed on HIGH fall risk patients) - Assessing Need for Low Bed  Risk For Fall Injury- Low Bed Criteria Previous fall this admission  Will Implement Low Bed and Floor Mats Yes (requested from portable equitment)  Screening for Fall Injury Risk (To be completed on HIGH fall risk patients who do not meet crieteria for Low Bed) - Assessing Need for Floor Mats Only  Risk For Fall Injury- Criteria for Floor Mats None identified - No additional interventions needed  Vitals  Temp 98.7 F (37.1 C)  Temp Source Oral  BP 135/87  MAP (mmHg) 98  BP Location Left Arm  BP Method Automatic  Patient Position (if appropriate) Sitting  Pulse Rate 81  Pulse Rate Source Monitor  Resp 18  Oxygen Therapy  SpO2 98 %  O2 Device Room Air  Neurological  Neuro (WDL) X  Level of Consciousness Alert  Orientation Level Oriented X4  Speech Delayed  responses  Musculoskeletal  Musculoskeletal (WDL) X  Assistive Device BSC  Generalized Weakness Yes  Integumentary  Integumentary (WDL) X  Skin Integrity Abrasion  Abrasion Location Back;Knee  Abrasion Location Orientation Left;Upper (upper left back, left anterior knee)  Abrasion Intervention  (assessed)     Pt assessment for post fall as noted above. NP Notified and order for CT of head. Post fall vitals initiated. VSS stable. Pt with no complaints at current. Will continue to monitor.

## 2019-06-21 NOTE — Progress Notes (Signed)
PROGRESS NOTE    Peter Parsons   [id number]  DOB: 1960-12-30  PCP: System, Provider Not In    DOA: 02/11/2019 LOS: 29   Brief Narrative   Peter Parsons is a 59 year old male with chronic schizophrenia with frequent suicidal ideations, bipolar disorder with depression, dementia, type 2 diabetes mellitus (controlled), CKD stage II, peripheral neuropathy, BPH, hypertension presented on 03/28/19 from the behavioral health unit where he had been admitted for 44 days prior to presenting to [place] with altered mental status, generalized weakness, dysphagia, slurred speech and left facial droop.  Stroke was ruled out by MRI, no significant stenosis on carotid dopplers, echo showed EF 50-55% without cardiac source of emboli.  Found B12 deficient and started on supplementation.    Of note, he initially presented on 02/11/19 with suicidal ideations and had wandered off into the woods.  He previously had multiple ED visits for injuries related to running away from his group home.  He'd also had an admission to [facility] in November 2020.  He was awaiting placement in the ED when he was noted to have above symptoms prompting this hospital admission for evaluation.  Psychiatry has been following patient and managing psych medications throughout admission.  Prolonged hospital stay due to social situation.  Patient previously lived with his parents who are both elderly and apparently with dementia and no longer able to care for the patient.  He has been awaiting SNF placement in a secured geri-psych unit.  Does not have capacity for decision-making, has guardian.     Assessment & Plan   Principal Problem:   Enterococcal bacteremia Active Problems:   Paranoid schizophrenia ([place])   Altered mental status   Dementia ([place])   Weakness   Acute metabolic encephalopathy   Protein-calorie malnutrition, severe   Pressure injury of skin   Hypertension   Type 2 diabetes mellitus (HCC)   Dyslipidemia   BPH (benign  prostatic hyperplasia)   Normocytic anemia   Thrombocytopenia (HCC)   Acute urinary retention   Urethral trauma   UTI (urinary tract infection)   Failure to thrive Patientis muchimproved with medication changes, much less lethargic in the morning. Physical therapy worked with the patient and noted that they do not need to be involved anymore the nursing can ambulate him. Nursing order placed to ambulate patient every shift. He should be getting up out of bed and walking around the unit as much as possible.  Chronic schizophreniaand Depression with bipolar disorder Appreciate Dr. Satira Sark coming by to see patient and his recommendations. Unclear whether his stiffness and increased tone is also improved. QTC is 433 on [id number]  Head lice - present on admission - resolved per prior attending notes --Already been treated with permethrin cream 5%.  Pressure injury, stage III, on coccyx and bilateral heels not POA Patient has remained afebrile with no leukocytosis to suggest infection. --Full-thickness tissue loss, being followed by wound care which is appreciated.  Pressure Injury 04/12/19 Sacrum Mid Stage 2 -  Partial thickness loss of dermis presenting as a shallow open injury with a red, pink wound bed without slough. has evolved to stage 2 when assessed on 5/3 (Active)  04/12/19 1248 (noted after giving bath)  Location: Sacrum  Location Orientation: Mid  Staging: Stage 2 -  Partial thickness loss of dermis presenting as a shallow open injury with a red, pink wound bed without slough.  Wound Description (Comments): has evolved to stage 2 when assessed on 5/3  Present on Admission: No  Pressure Injury 04/15/19 Heel Left Stage 2 -  Partial thickness loss of dermis presenting as a shallow open injury with a red, pink wound bed without slough. evolved to stage 2 pressure injury when assessed on 5/3 (Active)  04/15/19 2024  Location: Heel  Location Orientation: Left  Staging:  Stage 2 -  Partial thickness loss of dermis presenting as a shallow open injury with a red, pink wound bed without slough.  Wound Description (Comments): evolved to stage 2 pressure injury when assessed on 5/3  Present on Admission: No    BPH with urinary retention, now with chronic Foley Foley was placed by urology after having a traumatic urethral injury  Failed voiding trail, Coude Foley re-inserted on 4/13. Failed voiding trial 4/20 again Patient was able to void on his own on 4/22. Foley discontinued.  Constipation --Miralax BID  Fungal rash on his face after shaving of beard  --Keep area dry, continue Nystatin cream  Peripheral neuropathy --Continue gabapentin  Severe protein calorie malnutrition Remeron had been started to induce appetite however that was discontinued as noted above. Patient has had decreased p.o. intake but improving. --Continue nutrition supplement    Previous problems, now resolved:  Severe sepsis with septic shock. Resolved. Secondary to catheter associated UTI. Patient had multiple straight caths done past few days and Foley placed in on 4/1. Transferred to stepdown unit and multiple IV normal saline bolus received. Required Neo-Synephrine overnight and now stable off pressors. Sepsis resolved. Blood and urine culture growing Enterococcus faecalis. Antibiotic narrowed to ampicillin -completed course.  Hypokalemia/hypomagnesemia, Resolved. Replete electrolytes as needed and monitor.  Gross hematuria, resolved Secondary to traumatic catheterization.Foley removed and urology placed in a 20 [place] coud Foley without resistance. Recommend intermittent irrigation, keep Foley upon discharge and follow-up with urology in [place]. Urine now clear. Received 1 unit PRBC for drop in H&H.  AKI, resolved chronic kidney disease stage II Likely ATN with sepsis + bladder outlet obstruction.   Type 2 diabetes mellitus,  controlled A1c of 4.5. CBG stable.  Thrombocytopenia, resolved Unclear etiology.Possibly from acute illness.  Hypertension/ hyperlipidemia.  BP within goal. Off all blood pressure meds. Continue statin.  Dysphagia/dysarthria and left facial droop MRI negative for CVA. Continue dysphagia diet  Patient BMI: Body mass index is 25.98 kg/m.   DVT prophylaxis: SCD's  Diet:  Diet Orders (From admission, onward)    Start     Ordered   04/02/19 1459  DIET - DYS 1 Room service appropriate? Yes with Assist; Fluid consistency: Thin  Diet effective now    Comments: Extra gravy on meats, potatoes. Cream Soups, puddings, ice creams, Yogurts TID meals. Pt may have Oatmeal per speech.  Question Answer Comment  Room service appropriate? Yes with Assist   Fluid consistency: Thin      04/02/19 1459            Code Status: Full Code    Subjective 06/21/19    Patient seen at bedside.  Says he feels okay today.  Watching TV.  Denies pain, fever, chills or other acute complaints.  Disposition Plan & Communication   Status is: Inpatient  Remains inpatient appropriate because:he requires SNF placement in secured/locked unit due to wandering tendencies.  Placement still pending at this time.  Prior group home unable to care for patient adequately.   Dispo: The patient is from: Group home              Anticipated d/c is to: SNF  Anticipated d/c date is: > 3 days              Patient currently is medically stable to d/c.   Family Communication: none at bedside, will attempt to contact guardian     Significant Events:   Foley catheter had been placed, removed and replaced and for recurrent urinary retention. Urology had recommended leaving catheter in place at discharge, however patient was able to void on 4/22 and Foley has been discontinued since that time.   Has recovered from sepsis due to CAUTI and enterococcus bacteremia, required treatment with pressors in  stepdown unit, completed course of Ampicillin.  Gross hematuria associated with UTI >> acute blood loss anemia, was symptomatic with hypotension and received 1 unit of PRBC transfusion.    Found to have head lice on [id number] and was treated with permethrin cream.   Objective   Vitals:   06/20/19 0900 06/20/19 1450 06/20/19 2353 06/21/19 0723  BP: 99/72 126/72 113/64 106/67  Pulse: (!) 54 69 (!) 53 (!) 45  Resp: 14  17 18   Temp: 98.1 F (36.7 C) 98.7 F (37.1 C) 98.9 F (37.2 C) 98.2 F (36.8 C)  TempSrc: Oral Oral Axillary   SpO2: 97% 96% 98% 95%  Weight:      Height:        Intake/Output Summary (Last 24 hours) at 06/21/2019 1453 Last data filed at 06/20/2019 2030 Gross per 24 hour  Intake 240 ml  Output 400 ml  Net -160 ml   Filed Weights   05/25/19 0500 06/01/19 0500 06/15/19 0500  Weight: 83.5 kg 82.4 kg 79.8 kg    Physical Exam: General exam: sleeping comfortably, no acute distress Respiratory system: CTAB, normal respiratory effort. Cardiovascular system: normal S1/S2, RRR, no pedal edema.   Psychiatry: normal mood, flat affect, abnormal judgement and insight  Labs   Data Reviewed: I have personally reviewed following labs and imaging studies  CBC: No results for input(s): WBC, NEUTROABS, HGB, HCT, MCV, PLT in the last 168 hours. Basic Metabolic Panel: Recent Labs  Lab 06/19/19 0615  NA 142  K 3.7  CL 102  CO2 32  GLUCOSE 92  BUN 35*  CREATININE 0.98  CALCIUM 9.3   GFR: Estimated Creatinine Clearance: 79.2 mL/min (by C-G formula based on SCr of 0.98 mg/dL). Liver Function Tests: No results for input(s): AST, ALT, ALKPHOS, BILITOT, PROT, ALBUMIN in the last 168 hours. No results for input(s): LIPASE, AMYLASE in the last 168 hours. No results for input(s): AMMONIA in the last 168 hours. Coagulation Profile: No results for input(s): INR, PROTIME in the last 168 hours. Cardiac Enzymes: No results for input(s): CKTOTAL, CKMB, CKMBINDEX, TROPONINI in  the last 168 hours. BNP (last 3 results) No results for input(s): PROBNP in the last 8760 hours. HbA1C: No results for input(s): HGBA1C in the last 72 hours. CBG: No results for input(s): GLUCAP in the last 168 hours. Lipid Profile: No results for input(s): CHOL, HDL, LDLCALC, TRIG, CHOLHDL, LDLDIRECT in the last 72 hours. Thyroid Function Tests: No results for input(s): TSH, T4TOTAL, FREET4, T3FREE, THYROIDAB in the last 72 hours. Anemia Panel: No results for input(s): VITAMINB12, FOLATE, FERRITIN, TIBC, IRON, RETICCTPCT in the last 72 hours. Sepsis Labs: No results for input(s): PROCALCITON, LATICACIDVEN in the last 168 hours.  No results found for this or any previous visit (from the past 240 hour(s)).    Imaging Studies   No results found.   Medications   Scheduled Meds: . atorvastatin  10 mg Oral Daily  . chlorhexidine  15 mL Mouth/Throat BID  . escitalopram  10 mg Oral Daily  . feeding supplement (NEPRO CARB STEADY)  237 mL Oral TID BM  . feeding supplement (PRO-STAT SUGAR FREE 64)  30 mL Oral BID  . gabapentin  300 mg Oral TID  . multivitamin with minerals  1 tablet Oral Daily  . nystatin cream   Topical BID  . polyethylene glycol  17 g Oral BID  . senna-docusate  2 tablet Oral BID  . sodium chloride flush  10-40 mL Intracatheter Q12H  . tamsulosin  0.4 mg Oral Daily  . traZODone  50 mg Oral QHS  . ziprasidone  60 mg Oral BID   Continuous Infusions: . sodium chloride 250 mL (05/23/19 0309)       LOS: 84 days    Time spent: 15 minutes   Ezekiel Slocumb, DO [facility]  06/21/2019, 2:53 PM    If 7PM-7AM, please contact night-coverage. How to contact the [facility] Attending or Consulting provider [place] or covering provider during after hours [place], for this patient?    1. Check the care team in [facility] and look for a) attending/consulting TRH provider listed and b) the [facility] team listed 2. Log into [email or web address] and use [place]'s universal  password to access. If you do not have the password, please contact the hospital operator. 3. Locate the [facility] provider you are looking for under [facility] and page to a number that you can be directly reached. 4. If you still have difficulty reaching the provider, please page the [facility] (Director on Call) for the Hospitalists listed on amion for assistance.

## 2019-06-21 NOTE — Progress Notes (Incomplete)
?  PROGRESS NOTE ? ? ? ?Peter Parsons   ?[id number]: 1960/08/19  ?PCP: System, Provider Not In   ? ?DOA: 02/11/2019 ?LOS: 84  ? ?Brief Narrative  ? ?Peter Parsons is a 59 year old male with chronic schizophrenia with frequent suicidal ideations, bipolar disorder with depression, dementia, type 2 diabetes mellitus (controlled), CKD stage II, peripheral neuropathy, BPH, hypertension presented on 03/28/19 from the behavioral health unit where he had been admitted for 44 days prior to presenting to [place] with altered mental status, generalized weakness, dysphagia, slurred speech and left facial droop.  Stroke was ruled out by MRI, no significant stenosis on carotid dopplers, echo showed EF 50-55% without cardiac source of emboli.  Found B12 deficient and started on supplementation.   ? ?Of note, he initially presented on 02/11/19 with suicidal ideations and had wandered off into the woods.  He previously had multiple ED visits for injuries related to running away from his group home.  He'd also had an admission to [facility] in November 2020.  He was awaiting placement in the ED when he was noted to have above symptoms prompting this hospital admission for evaluation.  Psychiatry has been following patient and managing psych medications throughout admission. ? ?Prolonged hospital stay due to social situation.  Patient previously lived with his parents who are both elderly and apparently with dementia and no longer able to care for the patient.  He has been awaiting SNF placement in a secured geri-psych unit.  Does not have capacity for decision-making, has guardian. ?  ? ? ?Assessment & Plan  ? ?Principal Problem: ?  Enterococcal bacteremia ?Active Problems: ?  Paranoid schizophrenia ([place]) ?  Altered mental status ?  Dementia ([place]) ?  Weakness ?  Acute metabolic encephalopathy ?  Protein-calorie malnutrition, severe ?  Pressure injury of skin ?  Hypertension ?  Type 2 diabetes mellitus ([place]) ?  Dyslipidemia ?   BPH (benign prostatic hyperplasia) ?  Normocytic anemia ?  Jacolyn Reedy

## 2019-06-21 NOTE — Consult Note (Signed)
I saw the patient in his room and discussed the patient's care with nursing staff.  Although there are no notes regarding the event the record showed that he received 5 mg of haloperidol twice over the weekend.  According to nurses the explanation was that apparently he was trying to get out of bed.  The challenge with Peter Parsons care is that previously he was oversedated and mostly unresponsive.  Today we were able to carry on a very limited conversation however he refused to provide details when I asked about specific symptoms he might be suffering from.    We have reversed much of the oversedation that he was suffering from. We are left with the challenge that an inpatient medical bed is not a reasonable environment for a psychiatric patient.  We will likely thus struggle between the need to sedate him to keep him from hurting himself and thus return to oversedation versus decreasing his sedation and increasing the risk that he attempts to leave the bed.  I do not see a way that in this environment we can achieve both goals.  And unfortunately achieving both goals appears to be a requirement for him to leave an inpatient medical bed and receive ongoing care in a less restrictive environment that is outside this hospital.  It is thus essential that efforts to achieve placement out of the hospital accept the reality that he will either be slightly oversedated or slightly under sedated and not completely compliant with requests that he stay in bed.  Rather than utilize 2 antipsychotic medications I would recommend increasing ziprasidone to 80 mg twice a day and see if this can affect a psychotic process which might be the source of his behavior.  I do not see the change in trazodone as a likely explanation for the behaviors that were seen this weekend however it is temporarily related to the worsening behavior so it could certainly be increased back to 100 mg at night.  As we make adjustments in  medication it is important to obtain as much documentation as possible regarding his behaviors and the times and situations they are occurring in so that we can better modify our interventions and optimize his treatment.

## 2019-06-22 NOTE — Progress Notes (Signed)
Pt incites thoughts of self harm. States that he is thinking of harming him self by drowning. He further states that is is his voice not him telling him to harm himself. NP, Rachael Fee, made aware, she is reaching out to psychiatry. Suicide precautions placed. Sitter already in place for safety at the time of initiation od percautions

## 2019-06-22 NOTE — TOC Progression Note (Signed)
Transition of Care [facility]) - Progression Note    Patient Details  Name: Peter Parsons MRN: [id number] Date of Birth: 1960-08-20  Transition of [facility]) CM/SW Contact  Shelbie Ammons, RN Phone Number: 06/22/2019, 9:06 AM  Clinical Narrative:   RNCM received phone call from Dr. Fulton Reek, physician advisor who reports that the during call with [place] director it was decided that patient is not appropriate for group living high and would be more suited in a geri-pysch situation. Received email from [place] patient's [place] care co-ordinator with suggestion that CM reach out to [facility] for possible patient placement. This CM called facility and left VM for admissions person.     Expected Discharge Plan: Group Home Barriers to Discharge: SNF Pending bed offer  Expected Discharge Plan and Services Expected Discharge Plan: Group Home   Discharge Planning Services: CM Consult   Living arrangements for the past 2 months: Group Home                 DME Arranged: N/A DME Agency: [employer] Date DME Agency Contacted: 04/12/19 Time DME Agency Contacted: [phone] Representative spoke with at DME Agency: none needed [place] Arranged: NA           Social Determinants of Health ([place]) Interventions    Readmission Risk Interventions Readmission Risk Prevention Plan 04/27/2019 04/20/2019  Transportation Screening Complete Complete  PCP or Specialist Appt within 3-5 Days - Not Complete  Not Complete comments - not ready to [place] or Home Care Consult - Not Complete  HRI or Home Care Consult comments - not ready toDC, looking for a bed for placement  Social Work Consult for [place] Planning/Counseling - [place] - Not Applicable  Medication Review (RN Care Manager) Referral to Pharmacy Referral to Pharmacy  PCP or Specialist appointment within 3-5 days of discharge Complete -  [place] or [place]  Complete -  [place] Not Applicable -

## 2019-06-22 NOTE — Progress Notes (Addendum)
Brief cross cover note Nurse reports patient verbalized thoughts of self harm and means "drowning" Patient placed on suicide 1:1 precautions and Caroline Sauger NP on call for behavioral health called for stat consult.  Discussed with Mathis Bud. Consult changed appropriately to psychiatry services who will evaluate him in the morning.  He will remain on suicide precautions

## 2019-06-22 NOTE — Progress Notes (Signed)
Occupational Therapy Treatment Patient Details Name: Peter Parsons MRN: [id number] DOB: [birth year] Today's Date: 06/22/2019    History of present illness 59 y.o. male initially presenting to hospital 02/11/19 with Involuntary commitment from group home d/t wandering off into woods multiple times (found near water); pt reporting suicidal thoughts of drowning himself.  Pt tripped on his own feet and hit chin on chair on morning of 2/4 (pt got back up and walked to restroom independently).  Unable to return to group home d/t safety concerns.  Per chart review pt scored 19/30 on [facility] cognitive assessment with psychiatry indicative of moderate cognitive impairment (consistent with pt's diagnosis of dementia).  Pt was in [place] in ED for about 44 days prior to being admitted to hospital 2/13 for generalized weakness, dysphagia, and slurred speech.  MRI negative for acute CVA.  Hospital stay additionally significant for transfer to CCU due to hypotension, tachycardia associated with sepsis due to UTI.  PMH includes DM, htn, schizophrenia.   OT comments  Pt seen for OT tx this date to f/u re: safety with self care ADLs/ADL mobility. Pt demos impulsivity when OT walks into room-he is attempting to get up from bed I'ly to sit up EOB to eat lunch. OT provides fall prevention education as pt just experienced fall last night per nursing notes. OT assists pt in safe transfer from bed to chair with chair alarm to optimize positioning for self-feeding. In addition, while pt seated EOB, OT engages pt in changing gown as his current gown is covered in spilled food from breakfast. OT attempts to engage pt in education re: importance of hygiene and attention to personal hygiene including infection prevention. Pt with somewhat improved attention during education this date versus previous therapy sessions. Pt up to chair safely, eating lunch with call bell in reach at end of session. RN aware. D/c recommendation remains  appropriate.   Follow Up Recommendations  Home health OT;Supervision/Assistance - 24 hour    Equipment Recommendations  3 in 1 bedside commode    Recommendations for Other Services      Precautions / Restrictions Precautions Precautions: Fall Precaution Comments: Suicide precautions; aspiration precautions; monitor HR Restrictions Weight Bearing Restrictions: No       Mobility Bed Mobility Overal bed mobility: Needs Assistance       Supine to sit: Supervision;HOB elevated        Transfers Overall transfer level: Needs assistance Equipment used: 1 person hand held assist Transfers: Sit to/from Stand Sit to Stand: Min guard;Min assist              Balance Overall balance assessment: Needs assistance Sitting-balance support: Feet supported Sitting balance-Leahy Scale: Good     Standing balance support: Single extremity supported Standing balance-Leahy Scale: Fair Standing balance comment: somewhat flexed hips and flexed cervical spine/hunched over stance with shuffling steps from EOB to chair                           ADL either performed or assessed with clinical judgement   ADL Overall ADL's : Needs assistance/impaired     Grooming: Set up;Cueing for sequencing;Wash/dry face;Sitting           Upper Body Dressing : Minimal assistance;Sitting                           Vision Patient Visual Report: No change from baseline     Perception  Praxis      Cognition Arousal/Alertness: Awake/alert Behavior During Therapy: Flat affect Overall Cognitive Status: No family/caregiver present to determine baseline cognitive functioning                                 General Comments: Pt with somewhat improved general attention and appears to be in better spirits during this session-more talkative, less of a negative outlook, overall generally more appropriate.        Exercises Other Exercises Other Exercises:  OT facilitates education re: fall prevention safety considerations   Shoulder Instructions       General Comments      Pertinent Vitals/ Pain       Pain Assessment: No/denies pain  Home Living                                          Prior Functioning/Environment              Frequency  Min 1X/week        Progress Toward Goals  OT Goals(current goals can now be found in the care plan section)  Progress towards OT goals: Progressing toward goals  Acute Rehab OT Goals Patient Stated Goal: To get out of the hospital OT Goal Formulation: With patient Time For Goal Achievement: 06/24/19 Potential to Achieve Goals: Good  Plan Discharge plan remains appropriate;Frequency remains appropriate    Co-evaluation                 AM-PAC OT "6 Clicks" Daily Activity     Outcome Measure   Help from another person eating meals?: None Help from another person taking care of personal grooming?: A Little Help from another person toileting, which includes using toliet, bedpan, or urinal?: A Little Help from another person bathing (including washing, rinsing, drying)?: A Lot Help from another person to put on and taking off regular upper body clothing?: A Little Help from another person to put on and taking off regular lower body clothing?: A Lot 6 Click Score: 17    End of Session Equipment Utilized During Treatment: Gait belt  OT Visit Diagnosis: Unsteadiness on feet (R26.81);Muscle weakness (generalized) (M62.81)   Activity Tolerance Patient tolerated treatment well   Patient Left in chair;with call bell/phone within reach;with chair alarm set   Nurse Communication Mobility status;Other (comment)(notified pt up to chair eating lunch and that chair alarm in place.)        Time: 1142-1205 OT Time Calculation (min): 23 min  Charges: OT General Charges $OT Visit: 1 Visit OT Treatments $Self Care/Home Management : 8-22 mins $Therapeutic  Activity: 8-22 mins  Gerrianne Scale, MS, OTR/L ascom [phone] 06/22/19, 4:52 PM

## 2019-06-22 NOTE — Progress Notes (Signed)
PROGRESS NOTE    Peter Parsons   [id number]  DOB: 04-Dec-1960  PCP: System, Provider Not In    DOA: 02/11/2019 LOS: 31   Brief Narrative   Peter Parsons is a 59 year old male with chronic schizophrenia with frequent suicidal ideations, bipolar disorder with depression, dementia, type 2 diabetes mellitus (controlled), CKD stage II, peripheral neuropathy, BPH, hypertension presented on 03/28/19 from the behavioral health unit where he had been admitted for 44 days prior to presenting to [place] with altered mental status, generalized weakness, dysphagia, slurred speech and left facial droop.  Stroke was ruled out by MRI, no significant stenosis on carotid dopplers, echo showed EF 50-55% without cardiac source of emboli.  Found B12 deficient and started on supplementation.    Of note, he initially presented on 02/11/19 with suicidal ideations and had wandered off into the woods.  He previously had multiple ED visits for injuries related to running away from his group home.  He'd also had an admission to [facility] in November 2020.  He was awaiting placement in the ED when he was noted to have above symptoms prompting this hospital admission for evaluation.  Psychiatry has been following patient and managing psych medications throughout admission.  Prolonged hospital stay due to social situation.  Patient previously lived with his parents who are both elderly and apparently with dementia and no longer able to care for the patient.  He has been awaiting SNF placement in a secured geri-psych unit.  Does not have capacity for decision-making, has guardian.     Assessment & Plan   Principal Problem:   Enterococcal bacteremia Active Problems:   Paranoid schizophrenia ([place])   Altered mental status   Dementia ([place])   Weakness   Acute metabolic encephalopathy   Protein-calorie malnutrition, severe   Pressure injury of skin   Hypertension   Type 2 diabetes mellitus (HCC)   Dyslipidemia   BPH (benign  prostatic hyperplasia)   Normocytic anemia   Thrombocytopenia (HCC)   Acute urinary retention   Urethral trauma   UTI (urinary tract infection)   Failure to thrive Patientis muchimproved with medication changes, much less lethargic in the morning. Physical therapy worked with the patient and noted that they do not need to be involved anymore the nursing can ambulate him. Nursing order placed to ambulate patient every shift. He should be getting up out of bed and walking around the unit as much as possible.  Chronic schizophreniaand Depression with bipolar disorder Appreciate Dr. Satira Sark coming by to see patient and his recommendations. Unclear whether his stiffness and increased tone is also improved. QTC is [phone] [phone]  Head lice - present on admission - resolved per prior attending notes --Already been treated with permethrin cream 5%.  Pressure injury, stage III, on coccyx and bilateral heels not POA Patient has remained afebrile with no leukocytosis to suggest infection. --Full-thickness tissue loss, being followed by wound care which is appreciated.  Pressure Injury 04/12/19 Sacrum Mid Stage 2 -  Partial thickness loss of dermis presenting as a shallow open injury with a red, pink wound bed without slough. has evolved to stage 2 when assessed on 5/3 (Active)  04/12/19 1248 (noted after giving bath)  Location: Sacrum  Location Orientation: Mid  Staging: Stage 2 -  Partial thickness loss of dermis presenting as a shallow open injury with a red, pink wound bed without slough.  Wound Description (Comments): has evolved to stage 2 when assessed on 5/3  Present on Admission: No  Pressure Injury 04/15/19 Heel Left Stage 2 -  Partial thickness loss of dermis presenting as a shallow open injury with a red, pink wound bed without slough. evolved to stage 2 pressure injury when assessed on 5/3 (Active)  04/15/19 2024  Location: Heel  Location Orientation: Left  Staging:  Stage 2 -  Partial thickness loss of dermis presenting as a shallow open injury with a red, pink wound bed without slough.  Wound Description (Comments): evolved to stage 2 pressure injury when assessed on 5/3  Present on Admission: No    BPH with urinary retention, now with chronic Foley Foley was placed by urology after having a traumatic urethral injury  Failed voiding trail, Coude Foley re-inserted on 4/13. Failed voiding trial 4/20 again Patient was able to void on his own on 4/22. Foley discontinued.  Constipation --Miralax BID  Fungal rash on his face after shaving of beard  --Keep area dry, continue Nystatin cream  Peripheral neuropathy --Continue gabapentin  Severe protein calorie malnutrition Remeron had been started to induce appetite however that was discontinued as noted above. Patient has had decreased p.o. intake but improving. --Continue nutrition supplement    Previous problems, now resolved:  Severe sepsis with septic shock. Resolved. Secondary to catheter associated UTI. Patient had multiple straight caths done past few days and Foley placed in on 4/1. Transferred to stepdown unit and multiple IV normal saline bolus received. Required Neo-Synephrine overnight and now stable off pressors. Sepsis resolved. Blood and urine culture growing Enterococcus faecalis. Antibiotic narrowed to ampicillin -completed course.  Hypokalemia/hypomagnesemia, Resolved. Replete electrolytes as needed and monitor.  Gross hematuria, resolved Secondary to traumatic catheterization.Foley removed and urology placed in a 20 [place] coud Foley without resistance. Recommend intermittent irrigation, keep Foley upon discharge and follow-up with urology in [place]. Urine now clear. Received 1 unit PRBC for drop in H&H.  AKI, resolved chronic kidney disease stage II Likely ATN with sepsis + bladder outlet obstruction.   Type 2 diabetes mellitus,  controlled A1c of 4.5. CBG stable.  Thrombocytopenia, resolved Unclear etiology.Possibly from acute illness.  Hypertension/ hyperlipidemia.  BP within goal. Off all blood pressure meds. Continue statin.  Dysphagia/dysarthria and left facial droop MRI negative for CVA. Continue dysphagia diet  Patient BMI: Body mass index is 25.98 kg/m.   DVT prophylaxis: SCD's  Diet:  Diet Orders (From admission, onward)    Start     Ordered   04/02/19 1459  DIET - DYS 1 Room service appropriate? Yes with Assist; Fluid consistency: Thin  Diet effective now    Comments: Extra gravy on meats, potatoes. Cream Soups, puddings, ice creams, Yogurts TID meals. Pt may have Oatmeal per speech.  Question Answer Comment  Room service appropriate? Yes with Assist   Fluid consistency: Thin      04/02/19 1459            Code Status: Full Code    Subjective 06/22/19    Patient seen at bedside.  Says he wants to get up.  No acute complaints, denies pain fevers chills nausea or difficulty breathing.  Disposition Plan & Communication   Status is: Inpatient  Remains inpatient appropriate because:he requires SNF placement in secured/locked unit due to wandering tendencies.  Placement still pending at this time.  Prior group home unable to care for patient adequately.   Dispo: The patient is from: Group home              Anticipated d/c is to: SNF  Anticipated d/c date is: > 3 days              Patient currently is medically stable to d/c.   Family Communication: none at bedside, will attempt to contact guardian     Significant Events:   Foley catheter had been placed, removed and replaced and for recurrent urinary retention. Urology had recommended leaving catheter in place at discharge, however patient was able to void on 4/22 and Foley has been discontinued since that time.   Has recovered from sepsis due to CAUTI and enterococcus bacteremia, required treatment with  pressors in stepdown unit, completed course of Ampicillin.  Gross hematuria associated with UTI >> acute blood loss anemia, was symptomatic with hypotension and received 1 unit of PRBC transfusion.    Found to have head lice on [id number] and was treated with permethrin cream.   Objective   Vitals:   06/22/19 0500 06/22/19 0736 06/22/19 1215 06/22/19 1216  BP:  110/76 127/79 127/79  Pulse:  88 72 73  Resp:  16  16  Temp:  98.6 F (37 C) 97.8 F (36.6 C) 97.8 F (36.6 C)  TempSrc:  Oral Oral Oral  SpO2:  96% 98% 97%  Weight: 92.5 kg     Height:        Intake/Output Summary (Last 24 hours) at 06/22/2019 1345 Last data filed at 06/22/2019 1210 Gross per 24 hour  Intake 510 ml  Output 800 ml  Net -290 ml   Filed Weights   06/01/19 0500 06/15/19 0500 06/22/19 0500  Weight: 82.4 kg 79.8 kg 92.5 kg    Physical Exam: General exam: laying in bed awake, no acute distress Respiratory system: CTAB, normal respiratory effort. Cardiovascular system: normal S1/S2, RRR, no pedal edema.   Psychiatry: normal mood, flat affect, abnormal judgement and insight  Labs   Data Reviewed: I have personally reviewed following labs and imaging studies  CBC: No results for input(s): WBC, NEUTROABS, HGB, HCT, MCV, PLT in the last 168 hours. Basic Metabolic Panel: Recent Labs  Lab 06/19/19 0615  NA 142  K 3.7  CL 102  CO2 32  GLUCOSE 92  BUN 35*  CREATININE 0.98  CALCIUM 9.3   GFR: Estimated Creatinine Clearance: 88.9 mL/min (by C-G formula based on SCr of 0.98 mg/dL). Liver Function Tests: No results for input(s): AST, ALT, ALKPHOS, BILITOT, PROT, ALBUMIN in the last 168 hours. No results for input(s): LIPASE, AMYLASE in the last 168 hours. No results for input(s): AMMONIA in the last 168 hours. Coagulation Profile: No results for input(s): INR, PROTIME in the last 168 hours. Cardiac Enzymes: No results for input(s): CKTOTAL, CKMB, CKMBINDEX, TROPONINI in the last 168 hours. BNP  (last 3 results) No results for input(s): PROBNP in the last 8760 hours. HbA1C: No results for input(s): HGBA1C in the last 72 hours. CBG: No results for input(s): GLUCAP in the last 168 hours. Lipid Profile: No results for input(s): CHOL, HDL, LDLCALC, TRIG, CHOLHDL, LDLDIRECT in the last 72 hours. Thyroid Function Tests: No results for input(s): TSH, T4TOTAL, FREET4, T3FREE, THYROIDAB in the last 72 hours. Anemia Panel: No results for input(s): VITAMINB12, FOLATE, FERRITIN, TIBC, IRON, RETICCTPCT in the last 72 hours. Sepsis Labs: No results for input(s): PROCALCITON, LATICACIDVEN in the last 168 hours.  No results found for this or any previous visit (from the past 240 hour(s)).    Imaging Studies   CT HEAD WO CONTRAST  Result Date: 06/21/2019 CLINICAL DATA:  Unwitnessed fall with headaches,  initial encounter EXAM: CT HEAD WITHOUT CONTRAST TECHNIQUE: Contiguous axial images were obtained from the base of the skull through the vertex without intravenous contrast. COMPARISON:  03/27/2019 FINDINGS: Brain: Chronic atrophic changes are noted. No findings to suggest acute hemorrhage, acute infarction or space-occupying mass lesion are noted. Vascular: No hyperdense vessel or unexpected calcification. Skull: Normal. Negative for fracture or focal lesion. Sinuses/Orbits: No acute finding. Other: None. IMPRESSION: Chronic atrophic changes without acute abnormality. Electronically Signed   By: Inez Catalina M.D.   On: 06/21/2019 23:42     Medications   Scheduled Meds: . atorvastatin  10 mg Oral Daily  . chlorhexidine  15 mL Mouth/Throat BID  . escitalopram  10 mg Oral Daily  . feeding supplement (NEPRO CARB STEADY)  237 mL Oral TID BM  . feeding supplement (PRO-STAT SUGAR FREE 64)  30 mL Oral BID  . gabapentin  300 mg Oral TID  . multivitamin with minerals  1 tablet Oral Daily  . nystatin cream   Topical BID  . polyethylene glycol  17 g Oral BID  . senna-docusate  2 tablet Oral BID  .  sodium chloride flush  10-40 mL Intracatheter Q12H  . tamsulosin  0.4 mg Oral Daily  . traZODone  50 mg Oral QHS  . ziprasidone  60 mg Oral BID   Continuous Infusions: . sodium chloride 250 mL (05/23/19 0309)       LOS: 85 days    Time spent: 15 minutes   Ezekiel Slocumb, DO [facility]  06/22/2019, 1:45 PM    If 7PM-7AM, please contact night-coverage. How to contact the [facility] Attending or Consulting provider [place] or covering provider during after hours [place], for this patient?    1. Check the care team in [facility] and look for a) attending/consulting TR[facility] provider listed and b) the [facility] team listed 2. Log into [email or web address] and use [place]'s universal password to access. If you do not have the password, please contact the hospital operator. 3. Locate the [facility] provider you are looking for under [facility] and page to a number that you can be directly reached. 4. If you still have difficulty reaching the provider, please page the [facility] (Director on Call) for the Hospitalists listed on amion for assistance.

## 2019-06-23 NOTE — Progress Notes (Signed)
AC Stephanie notified of pt fall. Sitter was in room at time of fall. Kierra RN in room to assess pt.
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
## 2019-06-23 NOTE — TOC Progression Note (Signed)
Transition of Care [facility]) - Progression Note    Patient Details  Name: Peter Parsons MRN: [id number] Date of Birth: [birth year]  Transition of [facility]) CM/SW Contact  Shelbie Ammons, RN Phone Number: 06/23/2019, 1:10 PM  Clinical Narrative:   RNCM has completed application for admission for [facility] along with assistance of patient's guardian [name] by phone. Completed admission packet, Signed release of information, and requested clinicals have all been faxed for review. Spoke with Tanja Port, SW for facility and relayed all information.  Facility phone # [phone] Facility fax # [phone]    Expected Discharge Plan: Group Home Barriers to Discharge: SNF Pending bed offer  Expected Discharge Plan and Services Expected Discharge Plan: Group Home   Discharge Planning Services: CM Consult   Living arrangements for the past 2 months: Group Home                 DME Arranged: N/A DME Agency: [employer] Date DME Agency Contacted: 04/12/19 Time DME Agency Contacted: [phone] Representative spoke with at DME Agency: none needed [place] Arranged: NA           Social Determinants of Health ([place]) Interventions    Readmission Risk Interventions Readmission Risk Prevention Plan 04/27/2019 04/20/2019  Transportation Screening Complete Complete  PCP or Specialist Appt within 3-5 Days - Not Complete  Not Complete comments - not ready to [place] or Home Care Consult - Not Complete  HRI or Home Care Consult comments - not ready toDC, looking for a bed for placement  Social Work Consult for [place] Planning/Counseling - [place] - Not Applicable  Medication Review (RN Care Manager) Referral to Pharmacy Referral to Pharmacy  PCP or Specialist appointment within 3-5 days of discharge Complete -  [place] or [place] Complete -  [place] Not Applicable -

## 2019-06-23 NOTE — Consult Note (Signed)
I reviewed the chart discussing his behavior overnight and discussed the patient with his nurse and the hospitalist caring for him.  I also sat with the patient and had a more lengthy conversation than has been possible in my previous assessments. According to his medical care team he currently has no active medical problem and there is no reason for him to be on an inpatient medical unit.  The patient describes, and the EHR notes, that he last night stated that he wanted to drown himself.  Per our conversation, he states that this was a feeling of frustration and based on his desire to "get out of here".  He did not feel that he actually wanted to commit suicide and he said he had no other thoughts of how he might hurt himself.  It is important to consider this comment in light of the fact that he has been admitted to this hospital since December 31 of last year.  I confirmed that as of this moment he is not suicidal and has no intent to harm himself.  He also states that this behavior followed nightmares.  The recall of these nightmares are likely due to to decrease in all the overly sedating drugs and a slow return to normal mental functioning which will of course involve recall of dreams and nightmares - an experience seen in all of [place].  The change is that he now has the energy to respond to those dreams.   It must also be considered that even a patient who is oversedated is likely to have psychiatric symptoms and similar nightmares. The calm behavior is secondary to the oversedation. Because of the oversedation they cannot respond to the stress, it does not mean it does not exist.  My point is that potentially he is suffering in an oversedated state and this is why I altered his medications.  The data thus far will would imply that a return to oversedation would be harmful to his health and simply mask underlying stressful problems.  There is no evidence of an active psychotic process or mood disorder  at this time.  He has understandable frustration that his movements are limited and his efforts to leave the bed are similarly not based on confusion but a desire for change of venue and activity.  Again it is important to recall the history where he was overmedicated and excessively drowsy and this is why his behavior was so placid in the past.  He now is behaving as someone who is not overmedicated but is still in need of specialized care due to his dementia.  In our conversation he identifies the December admission date and repeatedly tries to call the social work to get himself out of the hospital.  He also said that he wants to return to his 47 year old father so that he can leave the hospital.  I have agreed with others that he needs to the equivalent of a skilled care nursing home that can appropriately care for someone with dementia and confusion secondary to dementia.  I feel that his current medications are appropriately treating him and there is not a need for medication intervention or changes at this time.  I strongly support a rapid and aggressive effort to find placement for him in the near short-term.  Over time the frustration of being in the hospital will no doubt lead to more statements of hopelessness and agitation which often leads to sedating medications which return him to the endless cycle of  oversedation and all the medical risks accompanying inactivity.  Avoiding that outcome is essential to meeting his health needs and providing appropriate care.

## 2019-06-23 NOTE — TOC Progression Note (Signed)
Transition of Care [facility]) - Progression Note    Patient Details  Name: Peter Parsons MRN: [id number] Date of Birth: [birth year]  Transition of Care [facility]) CM/SW Contact  Shelbie Ammons, RN Phone Number: 06/23/2019, 9:18 AM  Clinical Narrative:   RNCM faxed clinical information to [facility] for review, still unable to reach anyone by phone. Have left voice-mails.     Expected Discharge Plan: Group Home Barriers to Discharge: SNF Pending bed offer  Expected Discharge Plan and Services Expected Discharge Plan: Group Home   Discharge Planning Services: CM Consult   Living arrangements for the past 2 months: Group Home                 DME Arranged: N/A DME Agency: [employer] Date DME Agency Contacted: 04/12/19 Time DME Agency Contacted: [phone] Representative spoke with at DME Agency: none needed [place] Arranged: NA           Social Determinants of Health ([place]) Interventions    Readmission Risk Interventions Readmission Risk Prevention Plan 04/27/2019 04/20/2019  Transportation Screening Complete Complete  PCP or Specialist Appt within 3-5 Days - Not Complete  Not Complete comments - not ready to [place] or Home Care Consult - Not Complete  HRI or Home Care Consult comments - not ready toDC, looking for a bed for placement  Social Work Consult for [place] Planning/Counseling - [place] - Not Applicable  Medication Review (RN Care Manager) Referral to Pharmacy Referral to Pharmacy  PCP or Specialist appointment within 3-5 days of discharge Complete -  [place] or [place] Complete -  [place] Not Applicable -

## 2019-06-24 MED ORDER — ZIPRASIDONE HCL 40 MG PO CAPS
40.0000 mg | ORAL_CAPSULE | Freq: Every day | ORAL | Status: DC
  Administered 2019-06-25 – 2019-07-12 (×18): 40 mg via ORAL
  Filled 2019-06-24 (×19): qty 1

## 2019-06-24 MED ORDER — ZIPRASIDONE HCL 40 MG PO CAPS
60.0000 mg | ORAL_CAPSULE | Freq: Every day | ORAL | Status: DC
  Administered 2019-06-25 – 2019-07-11 (×17): 60 mg via ORAL
  Filled 2019-06-24 (×18): qty 1

## 2019-06-24 NOTE — Evaluation (Signed)
Physical Therapy Evaluation Patient Details Name: Peter Parsons MRN: [id number] DOB: 1961-01-22 Today's Date: 06/24/2019   History of Present Illness  Humbert Lowe eis a [id number] admitted after SI in December 2020. Pt has lived in hospital since Jan, group home setting prior. Pt has been signed off by PT moved to nursing for mobility needs. RN reports 3 falls in past 3 days, PT now called to evlauate.  Clinical Impression  Pt admitted with above diagnosis. Pt currently with functional limitations due to the deficits listed below (see "PT Problem List"). Pt minimally interactive, does respond to questioning ~5x in session, makes eye contact 2x. Pt follows cues for mobility, delayed response time as per baseline. Pt able to STS and SPT to recliner with good strength, several LOB demonstrative of altered equilibrium, delayed onset awareness of trunk sway, poor capacity to correct these LOB. Pt encouraged to sit up for at least 1 hour. AMB deferred to next session. Functional mobility assessment demonstrates consistent mobility deficits with documented baseline. RN unable to given detailed report of mobility history with patient, unclear if being walked daily with NSG. Pt will benefit from skilled PT intervention to increase independence and safety with basic mobility in preparation for discharge to the venue listed below.       Follow Up Recommendations Home health PT;Supervision for mobility/OOB    Equipment Recommendations  Rolling walker with 5" wheels;3in1 (PT)    Recommendations for Other Services       Precautions / Restrictions Precautions Precautions: Fall Restrictions Weight Bearing Restrictions: No      Mobility  Bed Mobility Overal bed mobility: Needs Assistance Bed Mobility: Supine to Sit     Supine to sit: Supervision        Transfers Overall transfer level: Needs assistance Equipment used: 1 person hand held assist Transfers: Sit to/from Stand;Stand Pivot Transfers Sit  to Stand: Min assist Stand pivot transfers: Min assist       General transfer comment: several near LOB durign SPT, largely postural sway control loss with delayed onset awareness, delayed capacity for righting  Ambulation/Gait Ambulation/Gait assistance: (deferred to next session.)              Stairs            Wheelchair Mobility    Modified Rankin (Stroke Patients Only)       Balance           Standing balance support: Bilateral upper extremity supported;During functional activity Standing balance-Leahy Scale: Poor                               Pertinent Vitals/Pain Pain Assessment: No/denies pain    Home Living Family/patient expects to be discharged to:: Unsure Living Arrangements: Group Home                    Prior Function                 Hand Dominance        Extremity/Trunk Assessment                Communication      Cognition   Behavior During Therapy: Flat affect Overall Cognitive Status: History of cognitive impairments - at baseline  General Comments      Exercises     Assessment/Plan    PT Assessment Patient needs continued PT services  PT Problem List Decreased strength;Decreased activity tolerance;Decreased balance;Decreased mobility;Decreased knowledge of use of DME;Decreased knowledge of precautions;Decreased skin integrity       PT Treatment Interventions DME instruction;Gait training;Stair training;Functional mobility training;Therapeutic activities;Therapeutic exercise;Balance training;Patient/family education    PT Goals (Current goals can be found in the Care Plan section)  Acute Rehab PT Goals Patient Stated Goal: To get out of the hospital PT Goal Formulation: With patient Time For Goal Achievement: 07/08/19 Potential to Achieve Goals: Poor    Frequency Min 2X/week   Barriers to discharge Decreased caregiver  support      Co-evaluation               AM-PAC PT "6 Clicks" Mobility  Outcome Measure Help needed turning from your back to your side while in a flat bed without using bedrails?: A Little Help needed moving from lying on your back to sitting on the side of a flat bed without using bedrails?: A Little Help needed moving to and from a bed to a chair (including a wheelchair)?: A Little Help needed standing up from a chair using your arms (e.g., wheelchair or bedside chair)?: A Little Help needed to walk in hospital room?: A Little Help needed climbing 3-5 steps with a railing? : A Lot 6 Click Score: 17    End of Session   Activity Tolerance: Patient tolerated treatment well;No increased pain Patient left: in chair;with call bell/phone within reach;with chair alarm set;with nursing/sitter in room   PT Visit Diagnosis: Other abnormalities of gait and mobility (R26.89);Muscle weakness (generalized) (M62.81);History of falling (Z91.81);Difficulty in walking, not elsewhere classified (R26.2)    Time: [id number] PT Time Calculation (min) (ACUTE ONLY): 20 min   Charges:   PT Evaluation $PT Eval High Complexity: 1 High         12:08 PM, 06/24/19 Etta Grandchild, PT, DPT Physical Therapist - [facility]  [phone] ([place])    [place] C 06/24/2019, 12:05 PM

## 2019-06-24 NOTE — Evaluation (Signed)
Occupational Therapy Re-Evaluation Patient Details Name: Peter Parsons MRN: [id number] DOB: 1960/09/28 Today's Date: 06/24/2019    History of Present Illness Peter Parsons is a 59yoM admitted after SI in December 2020. Pt has lived in hospital since Jan, group home setting prior. Pt's PMH includes schizophrenia, bipolar disorder with depression, DM, CKD II and HTN,   Clinical Impression   Peter Parsons presents to OT for re-evaluation with decreased cognition, flat affect, impaired balance, and generalized weakness affecting his ability to complete ADLs safely and independently.  He presents with slightly improved mood and engagement in today's discussion compared to previous sessions.  Pt declined taking a shower today, but was agreeable to sponge bath while seated in recliner.  OTR provided mod assist for sponge bathing.  Pt able to wash face, trunk, BUEs, and upper legs.  OTR provided assist to wash pt's back, bottom, and lower legs/feet.  Pt stood with min physical assist while OTR completed perihygiene.  Pt requires setup assist for upper body dressing.  Pt required max assist for LBD.  He was able to doff R sock using L foot to pull sock down.  OTR provided assistance to doff L sock and don B socks.  Pt completed sit to stands with min assist from OTR, declined use of RW.  Pt with one LOB while standing at sink 2/2 posterior lean.  OTR able to correct pt's balance and nursing nearby to provide 2 person assist for rest of session.  Pt able to complete standing grooming tasks with mod assist for standing balance at sink.  OTR and nursing provided mod assist x2 for toileting with urinal while standing in room.  OTR and nursing provided assist for standing balance and holding urinal.   Pt with decreased control in descent from stand > sit, improved technique with verbal cues from OTR for sequencing and safety.  OTR educated pt on benefits of meaningful occupational participation, and provided setup assist for  pt to color while seated in recliner.  Pt left coloring in recliner with 1:1 sitter present.  Peter Parsons will continue to benefit from skilled OT services to address balance, functional strengthening, cognition, and safety and independence in ADLs.  Recommend HHOT with 24 hour supervision after discharge.    Follow Up Recommendations  Home health OT;Supervision/Assistance - 24 hour    Equipment Recommendations  3 in 1 bedside commode    Recommendations for Other Services       Precautions / Restrictions Precautions Precautions: Fall Precaution Comments: Suicide precautions; aspiration precautions; monitor HR Restrictions Weight Bearing Restrictions: No      Mobility Bed Mobility Overal bed mobility: Needs Assistance Bed Mobility: Supine to Sit     Supine to sit: Supervision     General bed mobility comments: not tested  Transfers Overall transfer level: Needs assistance Equipment used: 1 person hand held assist Transfers: Sit to/from Stand;Stand Pivot Transfers Sit to Stand: Min assist Stand pivot transfers: Mod assist       General transfer comment: Pt with minimal control in stand > sit, improved when OTR provided verbal cues for safety and sequencing.    Balance Overall balance assessment: Needs assistance Sitting-balance support: Feet supported Sitting balance-Leahy Scale: Good Sitting balance - Comments: no lean noted this session, pt does tend to keep face turned to the R Postural control: Posterior lean Standing balance support: No upper extremity supported Standing balance-Leahy Scale: Poor Standing balance comment: Pt with posterior lean, one near LOB while standing at sink.  Pt began taking steps backward and OTR able to correct, nursing present to provide 2 person assist after LOB                           ADL either performed or assessed with clinical judgement   ADL Overall ADL's : Needs assistance/impaired Eating/Feeding: Set  up;Supervision/ safety;Cueing for safety Eating/Feeding Details (indicate cue type and reason): Pt observed self feeding while seated with setup assist, needs cues to attend Grooming: Wash/dry hands;Oral care;Set up;Minimal assistance;Standing;Cueing for safety Grooming Details (indicate cue type and reason): Min-mod assist for standing balance at sink while pt completed grooming tasks Upper Body Bathing: Cueing for sequencing;Minimal assistance;Sitting Upper Body Bathing Details (indicate cue type and reason): Min assist provided to wash back, provided verbal cues throughout for sequencing and initiation Lower Body Bathing: Moderate assistance;Cueing for sequencing;Sit to/from stand Lower Body Bathing Details (indicate cue type and reason): Mod assist provided to wash bottom and lower legs/feet while seated.  Pt able to wash upper legs.  Min assist provided for standing balance while completing perihygiene. Upper Body Dressing : Minimal assistance;Sitting Upper Body Dressing Details (indicate cue type and reason): Pt able to assist with UB dressing (gown change) this date. Lower Body Dressing: Maximal assistance;Sitting/lateral leans Lower Body Dressing Details (indicate cue type and reason): Max assist to doff/don socks.  Pt unable to reach feet (able to reach heel in figure 4 position).  Pt able to doff R sock using his other foot to slide off, OTR provided assist to doff L sock and don B socks. Toilet Transfer: Moderate assistance;+2 for physical assistance;Stand-pivot Toilet Transfer Details (indicate cue type and reason): OTR and nursing provided mod assist x2 for pt to stand while using urinal, pt started urinating before nursing able to hold urinal in place.  Assist provided for standing balance and holding urinal Toileting- Clothing Manipulation and Hygiene: Moderate assistance;Cueing for safety;Cueing for sequencing       Functional mobility during ADLs: Moderate assistance General ADL  Comments: min assist for sit to stand, pt needs cues for safety and sequencing in stand to sits.  Provided mod assist x 2 for standing balance, less assist needed when ambulating     Vision Patient Visual Report: No change from baseline       Perception     Praxis      Pertinent Vitals/Pain Pain Assessment: No/denies pain Pain Intervention(s): Limited activity within patient's tolerance;Monitored during session     Hand Dominance     Extremity/Trunk Assessment             Communication Communication Communication: Other (comment)(increased time for processing speech)   Cognition Arousal/Alertness: Awake/alert Behavior During Therapy: Flat affect Overall Cognitive Status: History of cognitive impairments - at baseline                                 General Comments: Pt presents with improved general attention and engagement in discussion, continues to have flat affect and varies in level of response when asked questions   General Comments       Exercises Other Exercises Other Exercises: provided education re: fall and safety precautions, functional mobility, importance of participation in meaningful occupations, self care   Shoulder Instructions      Home Living Family/patient expects to be discharged to:: Unsure Living Arrangements: Group Home Available Help at Discharge: Family;Available PRN/intermittently  Additional Comments: Pt limited historian, information gathered from chart review      Prior Functioning/Environment Level of Independence: Independent        Comments: Ambulatory no AD        OT Problem List:        OT Treatment/Interventions:      OT Goals(Current goals can be found in the care plan section) Acute Rehab OT Goals Patient Stated Goal: To get out of the hospital OT Goal Formulation: With patient Time For Goal Achievement: 07/08/19 Potential to Achieve Goals: Good  OT  Frequency: Min 1X/week   Barriers to D/C:            Co-evaluation              AM-PAC OT "6 Clicks" Daily Activity     Outcome Measure Help from another person eating meals?: None Help from another person taking care of personal grooming?: A Little Help from another person toileting, which includes using toliet, bedpan, or urinal?: A Little Help from another person bathing (including washing, rinsing, drying)?: A Lot Help from another person to put on and taking off regular upper body clothing?: A Little Help from another person to put on and taking off regular lower body clothing?: A Lot 6 Click Score: 17   End of Session Equipment Utilized During Treatment: Gait belt  Activity Tolerance: Patient tolerated treatment well Patient left: in chair;with call bell/phone within reach;with chair alarm set;with nursing/sitter in room  OT Visit Diagnosis: Unsteadiness on feet (R26.81);Muscle weakness (generalized) (M62.81)                Time: [id number] OT Time Calculation (min): 37 min Charges:  OT General Charges $OT Visit: 1 Visit OT Evaluation $OT Re-eval: 1 Re-eval OT Treatments $Self Care/Home Management : 23-37 mins  Myrtie Hawk Augustine Brannick, OTR/L 06/24/19, 2:52 PM
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
## 2019-06-24 NOTE — Progress Notes (Signed)
Pt fell at 1915 attempting to get up from [facility]. Sitter in room at the time of fall. The pt hit his head. No s/s of trauma or hematoma to the area when I assessed him. NP notified. Np came to bedside to evaluate pt. She does not suspect head injury at this time. Will continue to monitor following post fall protocol. Neuro checks and VS WDL so far. Pt denies pain.

## 2019-06-24 NOTE — Consult Note (Signed)
I met with the patient in his room and discussed his care with Ms. Eyvonne Mechanic via chat as well as with the one-to-one support staff in his room.  Apparently yesterday he was lethargic and there was a question of decreasing the a.m. dose of ziprasidone.  His one-to-one support noticed that he was also lethargic in the morning prior to the a.m. dose.  I also read the excellent work by physical therapy and occupational therapy to increase his functioning.  This will not only better understand his capability but give him increased activity and should overall improve his mental status.  Several recent falls are also of concern.  In my discussion with him today he would not respond other than nodding his head yes or no to various questions.  According to the one-to-one however at other times he has been far more verbal and communicative.  Although his quietness may be a voluntary choice his appearance is consistent with sedation and this may have multiple causes including the increased activity regarding the PT and OT evaluations.  That said, there is value in decreasing the a.m. dose to 40 mg from 60 mg of ziprasidone.  In the past his floor physician has been comfortable with that change so I placed that order.  Regarding the issue of ongoing problems with falls. In this transition from oversedation and to increased activity it is likely that he is going to want to get up and he has not yet developed the necessary stability and strenth.  I thus support the one-to-one to ensure his safety (although certainly a fall is still possible) alongside the wonderful efforts of PT and OT.  He currently has small tongue fasciculations which may be secondary to antipsychotics. As we continue to adjust the antipsychotic I would prefer to avoid giving haloperidol as an as needed medication as was done on the 5/9 2 times (48m @ 828 and 1840).  There is room for additional ziprasidone up to a total of 80 mg twice a day so if it  turns out that his psychiatric needs require a higher dose than I would prefer that additional one-time ziprasidone be given rather than haloperidol.  I will follow up with him tomorrow which is unfortunately my last day here.  My hope is that [facility] and we can find a care environment which is less restrictive and more capable of meeting his needs.

## 2019-06-24 NOTE — Progress Notes (Signed)
    BRIEF OVERNIGHT PROGRESS REPORT  SUBJECTIVE: Per primary RN, patient had witnessed fall at 1915 while attempting to get up from bedside commode. Sitter was at the bedside at the time of fall, she reported that patient hit his head on the side of the door without loss of consciousness. Patient state that his legs gave out on him. Patient denies pain, headache, dizziness, or change in his vision.   OBJECTIVE:On arrival to the bedsude, he was afebrile with blood pressure 145/86 mm Hg and pulse rate 101 beats/min. There were no focal neurological deficits; he was alert and oriented x4, and he did not demonstrate any memory deficits.   BRIEF PATIENT DESCRIPTION:  59 y.o. M with hx schizophrenia, bipolar disorder with depression, DM, CKD II and HTN who presented 12/31 with suicidal ideation after wandering into the woods. He has had two falls since admission.  ASSESSMENT/PLAN:  1. Recurrent falls -Continue post fall neuro assessment -Falls precaution initiated -We will hold off imaging at this time in the absence of obvious trauma or focal neurologic deficit for which resolution would be delayed. However, if change in mental status noted would consider imaging.   Rufina Falco, DNP, CCRN, FNP-C [facility] Nurse Practitioner Between 7pm to 7am - Pager [phone]  After 7am go to [email or web address] - [email or web address] select [facility]  [facility] [employer]  [phone]

## 2019-06-24 NOTE — Progress Notes (Signed)
Attempted to notify legal guardian of fall last night. No answer. Will notify day shift RN.

## 2019-06-25 NOTE — TOC Progression Note (Signed)
Transition of Care [facility]) - Progression Note    Patient Details  Name: Sheron Ortt MRN: [id number] Date of Birth: [birth year]  Transition of [facility]) CM/SW Contact  Shelbie Ammons, RN Phone Number: 06/25/2019, 10:54 AM  Clinical Narrative:   RNCM reached out to [facility] with [facility] and left VM to inquire about any assistance that may be available with patient's placement issues.   Faxed out referral information to [facility], [place] and [facility] who all have [place] facilities.     Expected Discharge Plan: Group Home Barriers to Discharge: SNF Pending bed offer  Expected Discharge Plan and Services Expected Discharge Plan: Group Home   Discharge Planning Services: CM Consult   Living arrangements for the past 2 months: Group Home                 DME Arranged: N/A DME Agency: [employer] Date DME Agency Contacted: 04/12/19 Time DME Agency Contacted: [phone] Representative spoke with at DME Agency: none needed [place] Arranged: NA           Social Determinants of Health ([place]) Interventions    Readmission Risk Interventions Readmission Risk Prevention Plan 04/27/2019 04/20/2019  Transportation Screening Complete Complete  PCP or Specialist Appt within 3-5 Days - Not Complete  Not Complete comments - not ready to [place] or Home Care Consult - Not Complete  HRI or Home Care Consult comments - not ready [place], looking for a bed for placement  Social Work Consult for [place] Planning/Counseling - [place] - Not Applicable  Medication Review (RN Care Manager) Referral to Pharmacy Referral to Pharmacy  PCP or Specialist appointment within 3-5 days of discharge Complete -  [place] or [place] Complete -  [place] Not Applicable -

## 2019-06-25 NOTE — Progress Notes (Signed)
Physical Therapy Treatment Patient Details Name: Peter Parsons MRN: [id number] DOB: 1960/07/08 Today's Date: 06/25/2019    History of Present Illness Peter Parsons is a 59yoM admitted after SI in December 2020. Pt has lived in hospital since Jan, group home setting prior. Pt's PMH includes schizophrenia, bipolar disorder with depression, DM, CKD II and HTN,    PT Comments    Pt in bed upon entry, recently there after several hours in recliner. Pt agreeable to AMB assessment/gait training. Pt able to AMB in hallway with RW at supervision level, rare LOB seen, but no physical correction is given from author. Pt denies any dizziness, fatigue of legs. Occasionally pt slows speed, but then is cued to maintain bigger steps, which brings more wind to his sails for another 60-90 seconds before needing another cue. After 348ft or RW AMB, pt is agreeable to attempt AMB unsupported (similar to PLOF at group home.) Pt tolerates 36ft of AMB without support or device. Pt is more interactive this date, answers questions about growing up in [place] and living in other nearby jurisdictions. At end of session pt is able to balance while attempting to fold up RW, demonstrates persistence and interest but is limited in problem solving, pt requires extensive cues to perform task, again no LOB. Pt has more affect of face this date compared to yesterday, RN reports some dose adjustments of certain medications for mood stabilization. Educated Actuary on strategies for stabilization during walking.     Follow Up Recommendations  Home health PT;Supervision for mobility/OOB     Equipment Recommendations  Rolling walker with 5" wheels;3in1 (PT)    Recommendations for Other Services       Precautions / Restrictions Precautions Precautions: Fall Precaution Comments: Suicide precautions; aspiration precautions; monitor HR Restrictions Weight Bearing Restrictions: No    Mobility  Bed Mobility Overal bed mobility: Needs  Assistance       Supine to sit: Supervision Sit to supine: Supervision   General bed mobility comments: no physical assist needed this date, still needs close supervision due to mild impulsivity, decreased safety awareness.  Transfers Overall transfer level: Needs assistance Equipment used: Rolling walker (2 wheeled) Transfers: Sit to/from Stand Sit to Stand: Min guard            Ambulation/Gait Ambulation/Gait assistance: Min guard Gait Distance (Feet): 320 Feet(then 50ft without RW) Assistive device: Rolling walker (2 wheeled) Gait Pattern/deviations: Narrow base of support;WFL(Within Functional Limits);Step-through pattern     General Gait Details: VC for large steps given Q 75ft, with fairly good carryover to total gait speed. Does well with trial AMB without RW.   Stairs             Wheelchair Mobility    Modified Rankin (Stroke Patients Only)       Balance Overall balance assessment: History of Falls;Mild deficits observed, not formally tested;Needs assistance         Standing balance support: No upper extremity supported;During functional activity Standing balance-Leahy Scale: Good                              Cognition Arousal/Alertness: Awake/alert Behavior During Therapy: Flat affect Overall Cognitive Status: History of cognitive impairments - at baseline                                 General Comments: delayed response, short on words, less  falt affect this date. Clearly interactive when engaged.      Exercises      General Comments        Pertinent Vitals/Pain Pain Assessment: No/denies pain    Home Living                      Prior Function            PT Goals (current goals can now be found in the care plan section) Acute Rehab PT Goals Patient Stated Goal: To get out of the hospital PT Goal Formulation: With patient Time For Goal Achievement: 07/08/19 Potential to Achieve Goals:  Poor Progress towards PT goals: Progressing toward goals    Frequency    Min 2X/week      PT Plan Current plan remains appropriate    Co-evaluation              AM-PAC PT "6 Clicks" Mobility   Outcome Measure  Help needed turning from your back to your side while in a flat bed without using bedrails?: A Little Help needed moving from lying on your back to sitting on the side of a flat bed without using bedrails?: A Little Help needed moving to and from a bed to a chair (including a wheelchair)?: A Little Help needed standing up from a chair using your arms (e.g., wheelchair or bedside chair)?: A Little Help needed to walk in hospital room?: A Little Help needed climbing 3-5 steps with a railing? : A Lot 6 Click Score: 17    End of Session Equipment Utilized During Treatment: Gait belt Activity Tolerance: Patient tolerated treatment well;No increased pain Patient left: in bed;with nursing/sitter in room Nurse Communication: Mobility status PT Visit Diagnosis: Other abnormalities of gait and mobility (R26.89);Muscle weakness (generalized) (M62.81);History of falling (Z91.81);Difficulty in walking, not elsewhere classified (R26.2)     Time: 1415-1430 PT Time Calculation (min) (ACUTE ONLY): 15 min  Charges:  $Gait Training: 8-22 mins                     3:01 PM, 06/25/19 Etta Grandchild, PT, DPT Physical Therapist - [facility]  [phone] ([place])     [place] C 06/25/2019, 2:55 PM

## 2019-06-25 NOTE — Consult Note (Signed)
I met with the patient and discussed his care with Ms. Eyvonne Mechanic his nurse as well as reviewed the notes including the comments from physical therapy which are quite positive.  The patient has reportedly been much more energetic and potentially even singing earlier today.  He requested to see me and I was able to tell him that this was my last day here.  Overall he has shown substantial improvement.  He expressed some anxiety over the change in living environment which is understandable given the many months he spent here on the unit.  I reassured him that we would be finding a good place for him to go and that in that place he would have more freedom to move as well as recreational therapy alongside the physical and occupational therapy.  He said that the lower dose of ziprasidone in the morning had worked well. As far as I can tell this is the appropriate dose and medication for him in his ongoing recovery.  I said goodbye to him and wished him well.
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
## 2019-06-26 NOTE — Plan of Care (Signed)
  Problem: Education: Goal: Knowledge of General Education information will improve Description: Including pain rating scale, medication(s)/side effects and non-pharmacologic comfort measures Outcome: Not Progressing   Problem: Activity: Goal: Risk for activity intolerance will decrease Outcome: Not Progressing   Problem: Nutrition: Goal: Adequate nutrition will be maintained Outcome: Not Progressing   Problem: Pain Managment: Goal: General experience of comfort will improve Outcome: Not Progressing   Problem: Skin Integrity: Goal: Risk for impaired skin integrity will decrease Outcome: Not Progressing   Problem: Education: Goal: Knowledge of General Education information will improve Description: Including pain rating scale, medication(s)/side effects and non-pharmacologic comfort measures Outcome: Not Progressing   Problem: Clinical Measurements: Goal: Ability to maintain clinical measurements within normal limits will improve Outcome: Not Progressing Goal: Will remain free from infection Outcome: Not Progressing Goal: Diagnostic test results will improve Outcome: Not Progressing Goal: Respiratory complications will improve Outcome: Not Progressing Goal: Cardiovascular complication will be avoided Outcome: Not Progressing   Problem: Elimination: Goal: Will not experience complications related to bowel motility Outcome: Not Progressing   Problem: Pain Managment: Goal: General experience of comfort will improve Outcome: Not Progressing   Problem: Safety: Goal: Ability to remain free from injury will improve Outcome: Not Progressing   Problem: Skin Integrity: Goal: Risk for impaired skin integrity will decrease Outcome: Not Progressing   Problem: Education: Goal: Knowledge of disease or condition will improve Outcome: Not Progressing Goal: Knowledge of secondary prevention will improve Outcome: Not Progressing Goal: Knowledge of patient specific risk factors  addressed and post discharge goals established will improve Outcome: Not Progressing   Problem: Nutrition: Goal: Risk of aspiration will decrease Outcome: Not Progressing   Problem: Intracerebral Hemorrhage Tissue Perfusion: Goal: Complications of Intracerebral Hemorrhage will be minimized Outcome: Not Progressing   Problem: Ischemic Stroke/TIA Tissue Perfusion: Goal: Complications of ischemic stroke/TIA will be minimized Outcome: Not Progressing
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
## 2019-06-27 MED ORDER — HALOPERIDOL LACTATE 5 MG/ML IJ SOLN
2.0000 mg | INTRAMUSCULAR | Status: DC | PRN
  Administered 2019-07-10 – 2019-07-11 (×2): 2 mg via INTRAMUSCULAR
  Filled 2019-06-27 (×3): qty 1
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
## 2019-06-27 NOTE — Plan of Care (Signed)
  Problem: Education: Goal: Knowledge of General Education information will improve Description: Including pain rating scale, medication(s)/side effects and non-pharmacologic comfort measures Outcome: Progressing   Problem: Activity: Goal: Risk for activity intolerance will decrease Outcome: Progressing   Problem: Nutrition: Goal: Adequate nutrition will be maintained Outcome: Progressing   Problem: Pain Managment: Goal: General experience of comfort will improve Outcome: Progressing   Problem: Safety: Goal: Ability to remain free from injury will improve Outcome: Progressing   Problem: Skin Integrity: Goal: Risk for impaired skin integrity will decrease Outcome: Progressing   Problem: Education: Goal: Knowledge of disease or condition will improve Outcome: Progressing Goal: Knowledge of secondary prevention will improve Outcome: Progressing Goal: Knowledge of patient specific risk factors addressed and post discharge goals established will improve Outcome: Progressing   Problem: Nutrition: Goal: Risk of aspiration will decrease Outcome: Progressing   Problem: Intracerebral Hemorrhage Tissue Perfusion: Goal: Complications of Intracerebral Hemorrhage will be minimized Outcome: Progressing   Problem: Ischemic Stroke/TIA Tissue Perfusion: Goal: Complications of ischemic stroke/TIA will be minimized Outcome: Progressing   Problem: Education: Goal: Knowledge of General Education information will improve Description: Including pain rating scale, medication(s)/side effects and non-pharmacologic comfort measures Outcome: Progressing   Problem: Clinical Measurements: Goal: Ability to maintain clinical measurements within normal limits will improve Outcome: Progressing Goal: Will remain free from infection Outcome: Progressing Goal: Diagnostic test results will improve Outcome: Progressing Goal: Respiratory complications will improve Outcome: Progressing Goal:  Cardiovascular complication will be avoided Outcome: Progressing   Problem: Elimination: Goal: Will not experience complications related to bowel motility Outcome: Progressing   Problem: Pain Managment: Goal: General experience of comfort will improve Outcome: Progressing   Problem: Safety: Goal: Ability to remain free from injury will improve Outcome: Progressing   Problem: Skin Integrity: Goal: Risk for impaired skin integrity will decrease Outcome: Progressing

## 2019-06-28 NOTE — Evaluation (Signed)
Clinical/Bedside Swallow Evaluation Patient Details  Name: Peter Parsons Parsons MRN: [id number] Date of Birth: 1960-04-16  Today's Date: 06/28/2019 Time: SLP Start Time (ACUTE ONLY): 67 SLP Stop Time (ACUTE ONLY): 1700 SLP Time Calculation (min) (ACUTE ONLY): 44 min  Past Medical History:  Past Medical History:  Diagnosis Date  . Diabetes mellitus without complication ([place])   . Hypertension   . Schizophrenia [facility])    Past Surgical History: History reviewed. No pertinent surgical history. HPI:  Peter Parsons is 59 y/o M with PMH including: T2DM, HTN, Dementia per chart, and paranoid schizophrenia who had been on the BHU 44 days prior to this encounter. Peter Parsons was admitted to the [facility] from his [place] after wandering into the woods near water. There were reports of suicidal ideations. Peter Parsons presents d/t the Acute floor d/t generalized weakness, suspicion of dysphagia, and slurred speech. There is concern for Medicaion effect per MD report. MRI negative for acute CVA.  Per chart, dx of Protein-calorie malnutrition, Severe.  Peter Parsons has improved during this lengthy hospitalization and engages verbally w/ others, walks w/ Peter Parsons and NSG in the halls; min verbal cues given to redirect when needed.   Assessment / Plan / Recommendation Clinical Impression  Peter Parsons seen for a repeat BSE in order to hopefully upgrade his diet consistency. He has been tolerating a pureed diet consistency well w/ no overt deficits noted per NSG. Today, Peter Parsons appears to present w/ grossly adequate oropharyngeal phase swallowing function w/ No overt clinical s/s of aspiration noted during oral intake at this evaluation. Peter Parsons is at reduced risk for aspiration when following general aspiration precautions. However, Peter Parsons does exhibit Min slower oral phase bolus management and clearing w/ increased texture intermittently w/ Min reduced awareness of full oral clearing b/f attempting to eat another bite of food -- suspect directly related to his Psychiatric baseline and  Meds. Peter Parsons's overall motor and speech responses are Min slower and soft; he is subdued in presentation and engagement but he will answer basic questions re: self when asked and noted more independent verbalizations during conversaton w/ him. During po trials, Peter Parsons consumed trials of thin liquids via Straw, purees, and soft solids w/ no overt s/s of aspiration noted; no decline in vocal quality or respiratory status during/post trials. Oral phase was grossly [facility] initially w/ Timely oral management and A-P transfer and oral clearing w/ liquids and purees; slight increase in Time for full mastication and oral clearing w/ solid foods. SLP and Sitter encouraged Peter Parsons to take his Time for chewing, swallowing and using lingual sweeping and alternative foods/liquids to aid oral clearing. Peter Parsons fed self w/ setup. OM exam appeared [facility] w/ no unilateral weakness noted. Oral Care rec'd before/after meals. Recommend a Mech Soft diet w/ thin liquids; general aspiration precautions; tray setup and support at meals for follow through w/ precautions and oral clearing as needed. Recommend reduce distractions during meals. Recommend Pills in Puree w/ NSG for safer swallowing. ST services can be reconsult for any new needs if arise during this admission.  SLP Visit Diagnosis: Dysphagia, unspecified (R13.10)    Aspiration Risk  (reduced following precautions)    Diet Recommendation  Mech Soft diet w/ thin liquids; gravies added to moisten foods. General aspiration precautions; supervision during meals for cues to follow through w/ general aspiration precautions and self-feeding  Medication Administration: Whole meds with puree(vs need to Crush in Puree for ease of swallow)    Other  Recommendations Recommended Consults: (Dietician f/u) Oral Care Recommendations: Oral care BID;Oral  care before and after PO;Patient independent with oral care(supported; guided) Other Recommendations: (n/a)   Follow up Recommendations None       Frequency and Duration (n/a)  (n/a)       Prognosis Prognosis for Safe Diet Advancement: Good Barriers to Reach Goals: Cognitive deficits;Time post onset;Severity of deficits;Medication Barriers/Prognosis Comment: psychiatric dx baseline      Swallow Study   General Date of Onset: 01/25/19 HPI: Peter Parsons is 60 y/o M with PMH including: T2DM, HTN, Dementia per chart, and paranoid schizophrenia who had been on the BHU 44 days prior to this encounter. Peter Parsons was admitted to the [facility] from his [place] after wandering into the woods near water. There were reports of suicidal ideations. Peter Parsons presents d/t the Acute floor d/t generalized weakness, suspicion of dysphagia, and slurred speech. There is concern for Medicaion effect per MD report. MRI negative for acute CVA.  Per chart, dx of Protein-calorie malnutrition, Severe.  Peter Parsons has improved during this lengthy hospitalization and engages verbally w/ others, walks w/ Peter Parsons and NSG in the halls; min verbal cues given to redirect when needed. Type of Study: Bedside Swallow Evaluation Previous Swallow Assessment: 03/2019; 04/2019 Diet Prior to this Study: Dysphagia 1 (puree);Thin liquids Temperature Spikes Noted: No Respiratory Status: Room air History of Recent Intubation: No Behavior/Cognition: Alert;Cooperative;Pleasant mood;Confused;Distractible;Requires cueing(baseline schizophrenia; sitter) Oral Cavity Assessment: Within Functional Limits Oral Care Completed by SLP: Yes Oral Cavity - Dentition: Adequate natural dentition Vision: Functional for self-feeding Self-Feeding Abilities: Able to feed self;Needs assist;Needs set up Patient Positioning: Upright in chair(w/ sitter present) Baseline Vocal Quality: Normal;Low vocal intensity(slight) Volitional Cough: Strong Volitional Swallow: Able to elicit    Oral/Motor/Sensory Function Overall Oral Motor/Sensory Function: Within functional limits   Ice Chips Ice chips: Within functional limits Presentation:  Spoon(1 trial)   Thin Liquid Thin Liquid: Within functional limits Presentation: Self Fed;Straw(6+ trials) Other Comments: water; intermittent b/t trials of foods    Nectar Thick Nectar Thick Liquid: Not tested   Honey Thick Honey Thick Liquid: Not tested   Puree Puree: Within functional limits Presentation: Self Fed;Spoon(4 trials)   Solid     Solid: Within functional limits Presentation: Self Fed;Spoon(8 trials - graham crackers w/ ice cream) Oral Phase Impairments: (WFL) Pharyngeal Phase Impairments: (none)       Orinda Kenner, MS, CCC-SLP Chesley Veasey 06/28/2019,4:54 PM

## 2019-06-28 NOTE — Progress Notes (Signed)
Nutrition Follow-up  DOCUMENTATION CODES:   Severe malnutrition in context of social or environmental circumstances  INTERVENTION:  Continue Nepro Shake po TID, each supplement provides 425 kcal and 19 grams protein.  Will discontinue Pro-Stat.  Continue daily MVI.  NUTRITION DIAGNOSIS:   Severe Malnutrition related to social / environmental circumstances(inadequate oral intake) as evidenced by percent weight loss, moderate fat depletion, moderate muscle depletion, severe muscle depletion.  Ongoing.  GOAL:   Patient will meet greater than or equal to 90% of their needs  Progressing.  MONITOR:   PO intake, Supplement acceptance, Labs, Weight trends, I & O's  REASON FOR ASSESSMENT:   Malnutrition Screening Tool    ASSESSMENT:   59 year old male with PMHx of schizophrenia, HTN, DM who has been in [place] for 44 days and is now admitted with generalized weakness, dysphagia, slurred speech, left facial droop to rule out CVA.  Met with patient at bedside. He was sitting up in bed and in good spirits. Patient had a crossword in his bed he had been working on. He reports his appetite is better now and he is eating all of his meals. He enjoys Nepro and wants to continue drinking these. He also enjoys milk and drank 2 cartons at breakfast this morning. Patient would like to discontinue Pro-Stat now that his appetite has improved. Patient is requesting more solid food now.   Medications reviewed and include: MVI daily, Miralax 17 grams BID, senna-docusate 2 tablets BID.  Labs reviewed.  Weight trend: 92.5 kg on 5/11 - unsure if this is accurate as it is significantly higher than previous weights; overall weights have trended up since admission; will continue to monitor  Diet Order:   Diet Order            DIET - DYS 1 Room service appropriate? Yes with Assist; Fluid consistency: Thin  Diet effective now             EDUCATION NEEDS:   No education needs have been identified  at this time  Skin:  Skin Assessment: Skin Integrity Issues:(stage 2 sacrum (0.8cm x 0.8cm x 0.1cm), stage 2 left heel (3cm x 2cm x 0.1cm))  Last BM:  06/27/2019 - large type 4  Height:   Ht Readings from Last 1 Encounters:  03/28/19 5' 9"  (1.753 m)   Weight:   Wt Readings from Last 1 Encounters:  06/22/19 92.5 kg   Ideal Body Weight:  72.7 kg  BMI:  Body mass index is 30.13 kg/m.  Estimated Nutritional Needs:   Kcal:  2000-2200  Protein:  100-110 grams  Fluid:  >/= 2 L/day  Jacklynn Barnacle, MS, RD, LDN Pager number available on Amion
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
## 2019-06-28 NOTE — Progress Notes (Signed)
Physical Therapy Treatment Patient Details Name: Peter Parsons MRN: [id number] DOB: May 17, 1960 Today's Date: 06/28/2019    History of Present Illness Fredie Ranard is a 59yoM admitted after SI in December 2020. Pt has lived in hospital since Jan, group home setting prior. Pt's PMH includes schizophrenia, bipolar disorder with depression, DM, CKD II and HTN,    PT Comments    Pt in bed in a dark room upon entry, awake in sidelying, sitter nearby. Pt acknowledges author's greeting, is convincing that he recognizes Chief Strategy Officer from prior sessions. Pt agreeable to PT session/gait training, reports he has been working hard on his walking, sitter corroborates AMB in room this date and out of room previous day. Pt is impulsive with OOB, but follows extensive safety cues and shows self control in awaiting a cue to resume mobility. Pt able to transfer to standing, then AMB >528ft (3x around unit) without assistive device. Pt does need cues to maintain big steps, does have intermittent scissoring without gross LOB. 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 Pt left up in recliner with SLP-CCC at end of session.      Follow Up Recommendations  Home health PT;Supervision for mobility/OOB     Equipment Recommendations  Rolling walker with 5" wheels;3in1 (PT)    Recommendations for Other Services       Precautions / Restrictions Precautions Precautions: Fall Precaution Comments: Suicide precautions; Restrictions Weight Bearing Restrictions: No    Mobility  Bed Mobility Overal bed mobility: Needs Assistance       Supine to sit: Supervision     General bed mobility comments: impulsive, needs cues for safety, has poor safety awareness  Transfers Overall transfer level: Needs assistance Equipment used: None Transfers: Sit to/from Stand Sit to Stand: Supervision         General transfer comment: impulsive, needs  cues for safety, has poor safety awareness  Ambulation/Gait Ambulation/Gait assistance: Min guard Gait Distance (Feet): 540 Feet Assistive device: None Gait Pattern/deviations: Narrow base of support;WFL(Within Functional Limits);Step-through pattern Gait velocity: 0.51m/s.   General Gait Details: VC for large steps, with fairly good carryover to total gait speed. <5x mild scissoring episodes without LOB, slows gait speed when author removes hand from gait belt.   Stairs             Wheelchair Mobility    Modified Rankin (Stroke Patients Only)       Balance Overall balance assessment: Mild deficits observed, not formally tested;Needs assistance Sitting-balance support: Feet supported;Feet unsupported Sitting balance-Leahy Scale: Normal     Standing balance support: No upper extremity supported;During functional activity Standing balance-Leahy Scale: Good                              Cognition Arousal/Alertness: Awake/alert Behavior During Therapy: WFL for tasks assessed/performed Overall Cognitive Status: History of cognitive impairments - at baseline                                 General Comments: delayed response, short on words, less flat affect this date.      Exercises      General Comments        Pertinent Vitals/Pain Pain Assessment: No/denies pain    Home Living  Prior Function            PT Goals (current goals can now be found in the care plan section) Acute Rehab PT Goals Patient Stated Goal: To get out of the hospital PT Goal Formulation: With patient Time For Goal Achievement: 07/08/19 Potential to Achieve Goals: Fair Progress towards PT goals: Progressing toward goals    Frequency    Min 2X/week      PT Plan Current plan remains appropriate    Co-evaluation              AM-PAC PT "6 Clicks" Mobility   Outcome Measure  Help needed turning from your back to  your side while in a flat bed without using bedrails?: None Help needed moving from lying on your back to sitting on the side of a flat bed without using bedrails?: A Little Help needed moving to and from a bed to a chair (including a wheelchair)?: A Little Help needed standing up from a chair using your arms (e.g., wheelchair or bedside chair)?: A Little Help needed to walk in hospital room?: A Little Help needed climbing 3-5 steps with a railing? : A Little 6 Click Score: 19    End of Session Equipment Utilized During Treatment: Gait belt Activity Tolerance: Patient tolerated treatment well;No increased pain Patient left: with nursing/sitter in room;in chair;Other (comment)(SLP in room) Nurse Communication: Mobility status PT Visit Diagnosis: Other abnormalities of gait and mobility (R26.89);Muscle weakness (generalized) (M62.81);History of falling (Z91.81);Difficulty in walking, not elsewhere classified (R26.2)     Time: [id number] PT Time Calculation (min) (ACUTE ONLY): 10 min  Charges:  $Gait Training: 8-22 mins                     6:12 PM, 06/28/19 Etta Grandchild, PT, DPT Physical Therapist - [facility]  [phone] ([place])     [place] C 06/28/2019, 6:08 PM
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
## 2019-06-29 NOTE — Plan of Care (Signed)
  Problem: Education: Goal: Knowledge of General Education information will improve Description: Including pain rating scale, medication(s)/side effects and non-pharmacologic comfort measures Outcome: Adequate for Discharge   Problem: Activity: Goal: Risk for activity intolerance will decrease Outcome: Adequate for Discharge   Problem: Nutrition: Goal: Adequate nutrition will be maintained Outcome: Adequate for Discharge   Problem: Pain Managment: Goal: General experience of comfort will improve Outcome: Adequate for Discharge   Problem: Safety: Goal: Ability to remain free from injury will improve Outcome: Adequate for Discharge   Problem: Skin Integrity: Goal: Risk for impaired skin integrity will decrease Outcome: Adequate for Discharge   Problem: Education: Goal: Knowledge of disease or condition will improve Outcome: Adequate for Discharge Goal: Knowledge of secondary prevention will improve Outcome: Adequate for Discharge Goal: Knowledge of patient specific risk factors addressed and post discharge goals established will improve Outcome: Adequate for Discharge   Problem: Nutrition: Goal: Risk of aspiration will decrease Outcome: Adequate for Discharge   Problem: Intracerebral Hemorrhage Tissue Perfusion: Goal: Complications of Intracerebral Hemorrhage will be minimized Outcome: Adequate for Discharge   Problem: Ischemic Stroke/TIA Tissue Perfusion: Goal: Complications of ischemic stroke/TIA will be minimized Outcome: Adequate for Discharge   Problem: Education: Goal: Knowledge of General Education information will improve Description: Including pain rating scale, medication(s)/side effects and non-pharmacologic comfort measures Outcome: Adequate for Discharge   Problem: Clinical Measurements: Goal: Ability to maintain clinical measurements within normal limits will improve Outcome: Adequate for Discharge Goal: Will remain free from infection Outcome:  Adequate for Discharge Goal: Diagnostic test results will improve Outcome: Adequate for Discharge Goal: Respiratory complications will improve Outcome: Adequate for Discharge Goal: Cardiovascular complication will be avoided Outcome: Adequate for Discharge   Problem: Elimination: Goal: Will not experience complications related to bowel motility Outcome: Adequate for Discharge   Problem: Pain Managment: Goal: General experience of comfort will improve Outcome: Adequate for Discharge   Problem: Safety: Goal: Ability to remain free from injury will improve Outcome: Adequate for Discharge   Problem: Skin Integrity: Goal: Risk for impaired skin integrity will decrease Outcome: Adequate for Discharge

## 2019-06-30 NOTE — Progress Notes (Signed)
OT Cancellation Note  Patient Details Name: Peter Parsons MRN: [id number] DOB: 1960/04/14   Cancelled Treatment:    Reason Eval/Treat Not Completed: Patient declined, no reason specified   Attempted to engage pt in OT tx session, but pt politely declined.  He presents pleasant and well groomed, reporting he already bathed this morning.  Pt denied any further self care needs and requested to work together another day.  Will continue to follow up at next opportunity.  Myrtie Hawk Jehad Bisono, OTR/L 06/30/19, 3:07 PM

## 2019-06-30 NOTE — Progress Notes (Signed)
SLP Cancellation Note  Patient Details Name: Peter Parsons MRN: [id number] DOB: [birth year]   Cancelled treatment:       Reason Eval/Treat Not Completed: (chart reviewed; consulted NSG re: pt's status). Per NSG report, pt is tolerating the upgraded diet to Mech Soft(cut meats) "very well"; no issues w/ swallowing and no s/s of aspiration noted during intake. Sitter/NSG are reminding pt to eat slowly, and they monitor for any impulsive eating/drinking behaviors.  ST services will sign off at this time as pt appears at his baseline and the recommended diet would be most beneficial for pt now and at discharge. NSG agreed and will reconsult if any new issues arise while admitted.     Orinda Kenner, [place], CCC-SLP Zareena Willis 06/30/2019, 3:55 PM
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
## 2019-07-02 LAB — GLUCOSE, CAPILLARY: Glucose-Capillary: 121 mg/dL — ABNORMAL HIGH (ref 70–99)
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
## 2019-07-02 NOTE — Progress Notes (Signed)
Physical Therapy Treatment Patient Details Name: Peter Parsons MRN: [id number] DOB: 12-18-1960 Today's Date: 07/02/2019    History of Present Illness Peter Parsons is a 59yoM admitted after SI in December 2020. Pt has lived in hospital since Jan, group home setting prior. Pt's PMH includes schizophrenia, bipolar disorder with depression, DM, CKD II and HTN,    PT Comments    Pt in bed upon entry in a dark room but awake, pt agreeable to participate. Pt able to progress AMB to >678ft this date, does well with following cues as needed, and is able to do a fairly good job at finding his way back to room from elevators. Pt performs all AMB without assistive device at supervision level, still has narrow stance in gait but with substantially fewer instances of crossover. Gait speed increased ~20+% since AB session on Monday (4 days previous). Pt also able to progress AMB to include 4 steps up/down with alternating pattern. Pt continues to progress back toward baseline.     Follow Up Recommendations  Home health PT;Supervision for mobility/OOB     Equipment Recommendations  Rolling walker with 5" wheels;3in1 (PT)    Recommendations for Other Services       Precautions / Restrictions Precautions Precautions: Fall Precaution Comments: Suicide precautions; Restrictions Weight Bearing Restrictions: No    Mobility  Bed Mobility Overal bed mobility: Modified Independent;Independent Bed Mobility: Supine to Sit     Supine to sit: Independent        Transfers Overall transfer level: Needs assistance Equipment used: None Transfers: Sit to/from Stand Sit to Stand: Supervision            Ambulation/Gait Ambulation/Gait assistance: Supervision Gait Distance (Feet): 692 Feet Assistive device: None Gait Pattern/deviations: Narrow base of support;WFL(Within Functional Limits);Step-through pattern Gait velocity: 0.22m/s this date (0.31m/s on 5/17)   General Gait Details: maintains  step-through gait throughout; average step width ~3-4" between insteps; appears unsteady only 1-2x during gait.   Stairs Stairs: Yes Stairs assistance: Min guard;Supervision Stair Management: Two rails;Alternating pattern Number of Stairs: 4 General stair comments: mild to moderate effort   Wheelchair Mobility    Modified Rankin (Stroke Patients Only)       Balance Overall balance assessment: Modified Independent;Mild deficits observed, not formally tested;History of Falls                                          Cognition Arousal/Alertness: Awake/alert Behavior During Therapy: WFL for tasks assessed/performed Overall Cognitive Status: History of cognitive impairments - at baseline                                 General Comments: At his baseline presentation, delayed response time, mild hypophonia, mildly withdrawn, mildly impulsive at times.      Exercises      General Comments        Pertinent Vitals/Pain Pain Assessment: No/denies pain    Home Living                      Prior Function            PT Goals (current goals can now be found in the care plan section) Acute Rehab PT Goals Patient Stated Goal: To get out of the hospital PT Goal Formulation: With patient Time For Goal  Achievement: 07/08/19 Potential to Achieve Goals: Good Progress towards PT goals: Progressing toward goals    Frequency    Min 2X/week      PT Plan Current plan remains appropriate    Co-evaluation              AM-PAC PT "6 Clicks" Mobility   Outcome Measure  Help needed turning from your back to your side while in a flat bed without using bedrails?: None Help needed moving from lying on your back to sitting on the side of a flat bed without using bedrails?: None Help needed moving to and from a bed to a chair (including a wheelchair)?: A Little Help needed standing up from a chair using your arms (e.g., wheelchair or  bedside chair)?: A Little Help needed to walk in hospital room?: A Little Help needed climbing 3-5 steps with a railing? : A Little 6 Click Score: 20    End of Session Equipment Utilized During Treatment: Gait belt Activity Tolerance: Patient tolerated treatment well;No increased pain Patient left: with nursing/sitter in room;Other (comment);in bed Nurse Communication: Mobility status PT Visit Diagnosis: Other abnormalities of gait and mobility (R26.89);Muscle weakness (generalized) (M62.81);History of falling (Z91.81);Difficulty in walking, not elsewhere classified (R26.2)     Time: 1030-1045 PT Time Calculation (min) (ACUTE ONLY): 15 min  Charges:  $Gait Training: 8-22 mins                     12:19 PM, 07/02/19 Etta Grandchild, PT, DPT Physical Therapist - [facility]  [phone] ([facility])     Perry C 07/02/2019, 12:14 PM

## 2019-07-03 NOTE — Progress Notes (Signed)
Plan of care reviewed with pt; medications discussed prior to administration; pt ambulated hallway several times during shift; no complaints of pain; sitter at bedside; safety precautions maintained; call bell within reach; bed low and locked.
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
## 2019-07-05 LAB — GLUCOSE, CAPILLARY: Glucose-Capillary: 111 mg/dL — ABNORMAL HIGH (ref 70–99)

## 2019-07-05 NOTE — TOC Transition Note (Signed)
Transition of Care [facility]) - CM/SW Discharge Note   Patient Details  Name: Peter Parsons MRN: [id number] Date of Birth: September 27, 1960  Transition of Care [facility] CM/SW Contact:  Shelbie Ammons, RN Phone Number: 07/05/2019, 9:54 AM   Clinical Narrative:   RNCM faxed information to [facility] as well as [facility]. Sent updated MAR to Vanna Scotland with [employer].     Final next level of care: Skilled Nursing Facility(memory care locked unit) Barriers to Discharge: SNF Pending bed offer   Patient Goals and CMS Choice Patient states their goals for this hospitalization and ongoing recovery are:: get out of here      Discharge Placement                       Discharge Plan and Services   Discharge Planning Services: CM Consult            DME Arranged: N/A DME Agency: [employer] Date DME Agency Contacted: 04/12/19 Time DME Agency Contacted: [phone] Representative spoke with at DME Agency: none needed [place] Arranged: NA          Social Determinants of Health ([place]) Interventions     Readmission Risk Interventions Readmission Risk Prevention Plan 04/27/2019 04/20/2019  Transportation Screening Complete Complete  PCP or Specialist Appt within 3-5 Days - Not Complete  Not Complete comments - not ready to [place] or [place] - Not Complete  HRI or Home Care Consult comments - not ready toDC, looking for a bed for placement  Social Work Consult for [place] Planning/Counseling - [place] - Not Applicable  Medication Review (RN Care Manager) Referral to Pharmacy Referral to Pharmacy  PCP or Specialist appointment within 3-5 days of discharge Complete -  [place] or [place] Complete -  [place] Not Applicable -

## 2019-07-05 NOTE — Progress Notes (Signed)
  Patient Details Name: Peter Parsons MRN: [id number] DOB: [birth year]   Pt Discharged from PT services:    Reason Eval/Treat Not Completed: PT screened, no needs identified, will sign off(Per chart review, pt has been AMB daily with nursing, up to 1035f in hallway. Pt has met all goal of therapy at this time.) Pt has been able to walk distances with PT up to 8060fwithout assitive device and without any gross LOB. Patient is near baseline level of independence, all education completed, and time is given to address all questions/concerns. No additional skilled PT services needed at this time, PT signing off. PT recommends daily ambulation ad lib or with nursing staff as needed to prevent deconditioning.   10:50 AM, 07/05/19 AlEtta GrandchildPT, DPT Physical Therapist - [id number] [id number]  [place] 07/05/2019, 10:50 AM

## 2019-07-05 NOTE — Progress Notes (Signed)
Nutrition Follow-up  DOCUMENTATION CODES:   Severe malnutrition in context of social or environmental circumstances  INTERVENTION:  Continue Nepro Shake po TID, each supplement provides 425 kcal and 19 grams protein.  Continue daily MVI.  NUTRITION DIAGNOSIS:   Severe Malnutrition related to social / environmental circumstances(inadequate oral intake) as evidenced by percent weight loss, moderate fat depletion, moderate muscle depletion, severe muscle depletion.  Ongoing - addressing with nutrition interventions.  GOAL:   Patient will meet greater than or equal to 90% of their needs  Met with oral nutrition supplements.  MONITOR:   PO intake, Supplement acceptance, Labs, Weight trends, I & O's  REASON FOR ASSESSMENT:   Malnutrition Screening Tool    ASSESSMENT:   59 year old male with PMHx of schizophrenia, HTN, DM who has been in [place] for 44 days and is now admitted with generalized weakness, dysphagia, slurred speech, left facial droop to rule out CVA.  -Patient's diet was advanced to dysphagia 3 with thin on 5/17.  Patient was out walking around the unit with sitter when RD initially attempted follow-up assessment. Returned to assess patient after he had finished walking. He reports he is very happy to have solid foods now. His appetite is good and he is eating 100% of his meals. In the past 24 hours patient has had 1738 kcal and 44 grams of protein from meals. He also drank 3 Nepro. Patient enjoys Nepro and would like to continue drinking them. Oral nutrition supplementation still needed to help patient meet calorie/protein needs.  Medications reviewed and include: MVI daily, Miralax 17 grams BID, senna-docusate 2 tablets BID.  Labs reviewed.  Diet Order:   Diet Order            DIET DYS 3 Room service appropriate? Yes with Assist; Fluid consistency: Thin  Diet effective now             EDUCATION NEEDS:   No education needs have been identified at this  time  Skin:  Skin Assessment: Skin Integrity Issues: Skin Integrity Issues:: Stage II Stage II: sacrum (0.8cm x 0.8cm x 0.1cm); left heel (3cm x 2cm x 0.1cm)  Last BM:  07/04/2019 per chart  Height:   Ht Readings from Last 1 Encounters:  03/28/19 5' 9"  (1.753 m)   Weight:   Wt Readings from Last 1 Encounters:  06/22/19 92.5 kg   Ideal Body Weight:  72.7 kg  BMI:  Body mass index is 30.13 kg/m.  Estimated Nutritional Needs:   Kcal:  4383-7793  Protein:  100-115 grams  Fluid:  >/= 2 L/day  Jacklynn Barnacle, MS, RD, LDN Pager number available on Amion
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
## 2019-07-05 NOTE — NC FL2 (Signed)
[place] LEVEL OF CARE SCREENING TOOL     IDENTIFICATION  Patient Name: Peter Parsons Birthdate: [birth year] Sex: male Admission Date (Current Location): 02/11/2019  [place] and [place] Number:  [name] [id number] [place] and Address:  [facility], [address] [place], [place] [address]      Provider Number: [id number]  Attending Physician Name and Address:  Lorella Nimrod, MD  Relative Name and Phone Number:  [name] [phone] legal guardian    Current Level of Care: Hospital Recommended Level of Care: [place], [facility], Memory Care Prior Approval Number:    Date Approved/Denied:   PASRR Number: [id number]  Discharge Plan: Other (Comment)(Memory Care)    Current Diagnoses: Patient Active Problem List   Diagnosis Date Noted  . Enterococcal bacteremia 05/15/2019  . Hypertension 05/15/2019  . Type 2 diabetes mellitus ([place]) 05/15/2019  . Dyslipidemia 05/15/2019  . BPH (benign prostatic hyperplasia) 05/15/2019  . Normocytic anemia 05/15/2019  . Thrombocytopenia ([place]) 05/15/2019  . Acute urinary retention 05/15/2019  . Urethral trauma 05/15/2019  . UTI (urinary tract infection) 05/15/2019  . Pressure injury of skin 04/13/2019  . Acute metabolic encephalopathy [id number]  . Protein-calorie malnutrition, severe 03/29/2019  . Weakness 03/27/2019  . Dementia ([place]) 03/19/2019  . Altered mental status   . Paranoid schizophrenia ([place])     Orientation RESPIRATION BLADDER Height & Weight     Self, Time, Place, Situation  Normal Continent Weight: 92.5 kg Height:  5\' 9"  (175.3 cm)  BEHAVIORAL SYMPTOMS/MOOD NEUROLOGICAL BOWEL NUTRITION STATUS  [place]   Continent Diet(Dysphagia 3)  AMBULATORY STATUS COMMUNICATION OF NEEDS Skin   Limited Assist Verbally PU Stage and Appropriate Care     PU Stage 3 Dressing: Daily                 Personal Care Assistance Level of Assistance  Bathing,  Dressing Bathing Assistance: Limited assistance   Dressing Assistance: Limited assistance     Functional Limitations Info  Sight, Hearing, Speech Sight Info: Adequate Hearing Info: Adequate Speech Info: Adequate    SPECIAL CARE FACTORS FREQUENCY  PT (By licensed PT), OT (By licensed OT)     PT Frequency: 5 times per week              Contractures Contractures Info: Not present    Additional Factors Info  Code Status, Allergies Code Status Info: Full Allergies Info: Acetaminophen           Current Medications (07/05/2019):  This is the current hospital active medication list Current Facility-Administered Medications  Medication Dose Route Frequency Provider Last Rate Last Admin  . atorvastatin (LIPITOR) tablet 10 mg  10 mg Oral Daily Carrie Mew, MD   10 mg at 07/04/19 1350  . chlorhexidine (PERIDEX) 0.12 % solution 15 mL  15 mL Mouth/Throat BID Lavina Hamman, MD   15 mL at 07/04/19 1350  . clonazePAM (KLONOPIN) tablet 0.25 mg  0.25 mg Oral TID PRN Dixie Dials, MD   0.25 mg at 07/05/19 0803  . dextrose 50 % solution 50 mL  1 ampule Intravenous PRN Lavina Hamman, MD      . escitalopram (LEXAPRO) tablet 10 mg  10 mg Oral Daily Cristofano, Dorene Ar, MD   10 mg at 07/04/19 1350  . feeding supplement (NEPRO CARB STEADY) liquid 237 mL  237 mL Oral TID BM Enzo Bi, MD   Stopped at 07/04/19 2100  . gabapentin (NEURONTIN) capsule 300 mg  300 mg Oral TID Carrie Mew, MD   300 mg at 07/05/19 [id number]  . haloperidol lactate (HALDOL) injection 2 mg  2 mg Intramuscular Q4H PRN Pearla Dubonnet, RPH      . metoprolol tartrate (LOPRESSOR) injection 5 mg  5 mg Intravenous Q12H PRN Wyvonnia Dusky, MD   5 mg at 05/14/19 1011  . multivitamin with minerals tablet 1 tablet  1 tablet Oral Daily Cristofano, Dorene Ar, MD   1 tablet at 07/04/19 1350  . nystatin cream (MYCOSTATIN)   Topical BID Lorella Nimrod, MD   Given at 07/02/19 2210  . polyethylene glycol (MIRALAX /  GLYCOLAX) packet 17 g  17 g Oral BID Enzo Bi, MD   17 g at 07/02/19 2210  . senna-docusate (Senokot-S) tablet 2 tablet  2 tablet Oral BID Lorella Nimrod, MD   2 tablet at 07/04/19 1350  . tamsulosin (FLOMAX) capsule 0.4 mg  0.4 mg Oral Daily Fritzi Mandes, MD   0.4 mg at 07/04/19 1350  . traZODone (DESYREL) tablet 50 mg  50 mg Oral QHS Alesia Morin, MD   50 mg at 07/04/19 2027  . ziprasidone (GEODON) capsule 40 mg  40 mg Oral Q2000 Alesia Morin, MD   40 mg at 07/05/19 0804  . ziprasidone (GEODON) capsule 60 mg  60 mg Oral Q2000 Alesia Morin, MD   60 mg at 07/04/19 2026     Discharge Medications: Please see discharge summary for a list of discharge medications.  Relevant Imaging Results:  Relevant Lab Results:   Additional Information SSN: [id number]  Shelbie Ammons, RN

## 2019-07-06 NOTE — TOC Progression Note (Signed)
Transition of Care [facility]) - Progression Note    Patient Details  Name: Peter Parsons MRN: [id number] Date of Birth: [birth year]  [facility]) CM/SW Contact  Shelbie Ammons, RN Phone Number: 07/06/2019, 9:36 AM  Clinical Narrative:   RNCM met with patient at bedside to discuss phone interview this afternoon at 1pm. Discussed that this CM will facilitate Facetime interview with facility representative Jay. Patient alert and pleasant this am, questioning what the name of the facility is and where it is located. Provided information that facility is called [facility] and it is in [place]. Patient verbalizes being pleased with this as it is only one town over from his home town of [place].   RNCM sent updated FL-2 to patient's guardian [name].     Expected Discharge Plan: Group Home Barriers to Discharge: SNF Pending bed offer  Expected Discharge Plan and Services Expected Discharge Plan: Group Home   Discharge Planning Services: CM Consult   Living arrangements for the past 2 months: Group Home                 DME Arranged: N/A DME Agency: [employer] Date DME Agency Contacted: 04/12/19 Time DME Agency Contacted: [phone] Representative spoke with at DME Agency: none needed [place] Arranged: NA           Social Determinants of Health ([place]) Interventions    Readmission Risk Interventions Readmission Risk Prevention Plan 04/27/2019 04/20/2019  Transportation Screening Complete Complete  PCP or Specialist Appt within 3-5 Days - Not Complete  Not Complete comments - not ready to [place] or Home Care Consult - Not Complete  HRI or Home Care Consult comments - not ready toDC, looking for a bed for placement  Social Work Consult for [place] Planning/Counseling - [place] - Not Applicable  Medication Review (RN Care Manager) Referral to Pharmacy Referral to Pharmacy  PCP or Specialist appointment within 3-5 days of discharge Complete -   [place] or [place] Complete -  [place] Not Applicable -

## 2019-07-06 NOTE — TOC Progression Note (Signed)
Transition of Care [facility]) - Progression Note    Patient Details  Name: Peter Parsons MRN: [id number] Date of Birth: 05/12/1960  Transition of Care [facility]) CM/SW Contact  Shelbie Ammons, RN Phone Number: 07/06/2019, 2:38 PM  Clinical Narrative:   RNCM assisted with conducting phone/Facetime interview between patient and Ulice Dash, admissions coordinator with [facility]. Patient was alert and pleasant throughout interview and answered questions appropriately. He was able to get up and demonstrate ADLs during meeting as well. After in person meeting with patient, did have long discussion with Ulice Dash afterwards and provided history and updates. After discussion Ulice Dash reported that he would be able to take patient and reported that they typically admit on Monday or Tuesday. Discussed that as soon as possible would be best. Ulice Dash reported that he would get in touch with his SW at the facility and they would get his admission information started. Ulice Dash further reports that patient will need at least his first Covid Vaccine prior to being admitted.   Placed call to patient's guardian [name] to discuss case and update her on events of today. She will follow up patient as well and does report that patient would need to transport down there by EMS.     Expected Discharge Plan: Group Home Barriers to Discharge: SNF Pending bed offer  Expected Discharge Plan and Services Expected Discharge Plan: Group Home   Discharge Planning Services: CM Consult   Living arrangements for the past 2 months: Group Home                 DME Arranged: N/A DME Agency: [employer] Date DME Agency Contacted: 04/12/19 Time DME Agency Contacted: [phone] Representative spoke with at DME Agency: none needed [place] Arranged: NA           Social Determinants of Health ([place]) Interventions    Readmission Risk Interventions Readmission Risk Prevention Plan 04/27/2019 04/20/2019  Transportation Screening Complete Complete  PCP or Specialist  Appt within 3-5 Days - Not Complete  Not Complete comments - not ready to [place] or Home Care Consult - Not Complete  HRI or Home Care Consult comments - not ready toDC, looking for a bed for placement  Social Work Consult for [place] Planning/Counseling - [place] - Not Applicable  Medication Review (RN Care Manager) Referral to Pharmacy Referral to Pharmacy  PCP or Specialist appointment within 3-5 days of discharge Complete -  [place] or [place] Complete -  [place] Not Applicable -
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
## 2019-07-08 ENCOUNTER — Ambulatory Visit: Payer: Medicaid Other | Attending: Internal Medicine

## 2019-07-08 DIAGNOSIS — Z23 Encounter for immunization: Secondary | ICD-10-CM

## 2019-07-08 NOTE — Progress Notes (Signed)
   Covid-19 Vaccination Clinic  Name:  Peter Parsons    MRN: [id number] DOB: 1960-08-23  07/08/2019  Peter Parsons was observed post Covid-19 immunization for 15 minutes without incident. He was provided with Vaccine Information Sheet and instruction to access the V-Safe system.   Peter Parsons was instructed to call 911 with any severe reactions post vaccine: Marland Kitchen Difficulty breathing  . Swelling of face and throat  . A fast heartbeat  . A bad rash all over body  . Dizziness and weakness   Immunizations Administered    Name Date Dose VIS Date Route   JANSSEN COVID-19 VACCINE 07/08/2019 10:50 AM 0.5 mL 04/10/2019 Intramuscular   Manufacturer: Alphonsa Overall   Lot: [id number]   [place]: [id number]

## 2019-07-08 NOTE — TOC Progression Note (Signed)
Transition of Care [facility]) - Progression Note    Patient Details  Name: Peter Parsons MRN: [id number] Date of Birth: [birth year]  Transition of Care [facility]) CM/SW Contact  Shelbie Ammons, RN Phone Number: 07/08/2019, 10:58 AM  Clinical Narrative:   Patient being prepared for discharge to [facility] on Monday. Have spoken with Peter Parsons his guardian and she is completing his paperwork for facility today. RNCM sent message to facility representative Ulice Dash to follow up on when patient will need Covid screen completed. Patient will get Wynetta Emery and Wynetta Emery Covid vaccine today verbal permission was given by guardian Peter Parsons.     Expected Discharge Plan: Group Home Barriers to Discharge: SNF Pending bed offer  Expected Discharge Plan and Services Expected Discharge Plan: Group Home   Discharge Planning Services: CM Consult   Living arrangements for the past 2 months: Group Home                 DME Arranged: N/A DME Agency: [employer] Date DME Agency Contacted: 04/12/19 Time DME Agency Contacted: [phone] Representative spoke with at DME Agency: none needed [place] Arranged: NA           Social Determinants of Health ([place]) Interventions    Readmission Risk Interventions Readmission Risk Prevention Plan 04/27/2019 04/20/2019  Transportation Screening Complete Complete  PCP or Specialist Appt within 3-5 Days - Not Complete  Not Complete comments - not ready to [place] or Home Care Consult - Not Complete  HRI or Home Care Consult comments - not ready [place], looking for a bed for placement  Social Work Consult for [place] Planning/Counseling - [place] - Not Applicable  Medication Review (RN Care Manager) Referral to Pharmacy Referral to Pharmacy  PCP or Specialist appointment within 3-5 days of discharge Complete -  [place] or [place] Complete -  [place] Not Applicable -
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
## 2019-07-08 NOTE — Evaluation (Signed)
Occupational Therapy Re-Evaluation Patient Details Name: Peter Parsons MRN: [id number] DOB: 1960/07/31 Today's Date: 07/08/2019    History of Present Illness Peter Parsons is a 59yoM admitted after SI in December 2020. Pt has lived in hospital since Jan, group home setting prior. Pt's PMH includes schizophrenia, bipolar disorder with depression, DM, CKD II and HTN,   Clinical Impression   Peter Parsons continues to make progress towards his functional goals related to self care, ADLs, and well being.  When asked how pt was doing, he replied "terrible".  OTR prompted pt to explain why he was feeling terrible, and pt replied that it is due to personal reasons and would not elaborate.  OTR encouraged pt to talk to providers if he needs additional support.  Pt appeared slightly anxious during today's session but was agreeable to showering and completing ADLs.  OTR grossly provided min guard for standing ADLs and functional mobility as pt is impulsive and has decreased safety awareness.  He presents with improved balance, but continues to require min guard or supervision 2/2 impulsivity with movement.  OTR provided min guard when pt stood in shower, as well as for standing balance while grooming at sink and to don/doff pants.  OTR provided min assist to don L sock while pt was seated EOB.  Pt was able to don/doff footwear and pants, wash face, wash body, and don/doff shirt.  Pt was left in recliner with all needs in reach and feeding self lunch with setup assist.  Peter Parsons will continue to benefit from skilled OT services in acute setting to address functional strengthening, balance, engagement in meaningful occupations, and safety and independence in ADLs.  Discharge recommendation remains appropriate.    Follow Up Recommendations  Home health OT;Supervision/Assistance - 24 hour    Equipment Recommendations  3 in 1 bedside commode;Tub/shower seat    Recommendations for Other Services       Precautions /  Restrictions Precautions Precautions: Fall Precaution Comments: Suicide precautions; Restrictions Weight Bearing Restrictions: No      Mobility Bed Mobility   Bed Mobility: Supine to Sit     Supine to sit: Supervision Sit to supine: Supervision   General bed mobility comments: impulsive, needs cues for safety, has poor safety awareness  Transfers Overall transfer level: Needs assistance Equipment used: None Transfers: Sit to/from Stand;Stand Pivot Transfers Sit to Stand: Supervision Stand pivot transfers: Min guard       General transfer comment: impulsive, needs cues for safety, has poor safety awareness    Balance Overall balance assessment: Modified Independent;Mild deficits observed, not formally tested;History of Falls Sitting-balance support: Feet supported;No upper extremity supported Sitting balance-Leahy Scale: Normal     Standing balance support: During functional activity;Single extremity supported Standing balance-Leahy Scale: Good Standing balance comment: Pt benefits from UE support when available, especially during functional tasks                           ADL either performed or assessed with clinical judgement   ADL Overall ADL's : Needs assistance/impaired Eating/Feeding: Set up;Supervision/ safety;Cueing for safety Eating/Feeding Details (indicate cue type and reason): Pt observed self feeding while seated with setup assist, needs cues to attend Grooming: Wash/dry hands;Oral care;Set up;Standing;Cueing for safety;Min guard Grooming Details (indicate cue type and reason): Min guard assist while standing at sink, min assist needed for two handed tasks (pt benefits from UE support in functional task) Upper Body Bathing: Cueing for sequencing;Minimal assistance;Sitting Upper Body  Bathing Details (indicate cue type and reason): Min assist to wash back, mod verbal cues for initiation/sequencing/safety precautions (pt impulsive with  standing) Lower Body Bathing: Sit to/from stand;Minimal assistance;Cueing for safety Lower Body Bathing Details (indicate cue type and reason): Min guard needed while standing for balance, pt able to reach feet while seated in figure 4 position Upper Body Dressing : Sitting;Set up   Lower Body Dressing: Minimal assistance Lower Body Dressing Details (indicate cue type and reason): Min guard provided when pt standing to don/doff pants and footwear, physical assist to don L sock.  Pt able to thread BLEs through pants, stand to pull over hips, and don R sock. Toilet Transfer: Min guard;Ambulation;Grab bars;Cueing for safety Toilet Transfer Details (indicate cue type and reason): Min verbal cues needed for safety/sequencing, use of grab bars Toileting- Clothing Manipulation and Hygiene: Min guard;Sit to/from stand;Cueing for safety   Tub/ Shower Transfer: Nurse, learning disability Details (indicate cue type and reason): Mod verbal cues for safety and sequencing.  Pt side stepped using RW and grab bars into shower, min guard Functional mobility during ADLs: Min guard General ADL Comments: Min guard throughout for standing ADLs/functional mobility, needs constant verbal cues for safety as pt is impulsive with mobility.     Vision Patient Visual Report: No change from baseline       Perception     Praxis      Pertinent Vitals/Pain Pain Assessment: No/denies pain     Hand Dominance     Extremity/Trunk Assessment Upper Extremity Assessment Upper Extremity Assessment: Generalized weakness   Lower Extremity Assessment Lower Extremity Assessment: Generalized weakness   Cervical / Trunk Assessment Cervical / Trunk Assessment: Kyphotic   Communication Communication Communication: Other (comment)(increased time for processing speech)   Cognition Arousal/Alertness: Awake/alert Behavior During Therapy: WFL for tasks assessed/performed Overall Cognitive Status: History of cognitive  impairments - at baseline                                 General Comments: At his baseline presentation, delayed response time, mild hypophonia, mildly withdrawn, mildly impulsive at times.   General Comments       Exercises Other Exercises Other Exercises: provided grossly min assist for functional mobility, toileting, standing grooming, showering, and dressing Other Exercises: provided education re: fall and safety precautions, functional mobility, importance of participation in meaningful occupations, self care   Shoulder Instructions      Home Living Family/patient expects to be discharged to:: Group home([facility]) Living Arrangements: Group Home Available Help at Discharge: Family;Available PRN/intermittently Type of Home: Group Home                           Additional Comments: group home living setup is unclear      Prior Functioning/Environment Level of Independence: Independent        Comments: Ambulatory no AD        OT Problem List: Decreased strength;Decreased activity tolerance;Impaired balance (sitting and/or standing);Decreased safety awareness;Decreased knowledge of use of DME or AE;Decreased knowledge of precautions;Decreased cognition      OT Treatment/Interventions: Self-care/ADL training;Therapeutic exercise;DME and/or AE instruction;Therapeutic activities;Patient/family education;Balance training    OT Goals(Current goals can be found in the care plan section) Acute Rehab OT Goals Patient Stated Goal: To get out of the hospital OT Goal Formulation: With patient Time For Goal Achievement: 07/22/19 Potential to Achieve Goals:  Good  OT Frequency: Min 1X/week   Barriers to D/C:            Co-evaluation              AM-PAC OT "6 Clicks" Daily Activity     Outcome Measure Help from another person eating meals?: None Help from another person taking care of personal grooming?: A Little Help from another  person toileting, which includes using toliet, bedpan, or urinal?: A Little Help from another person bathing (including washing, rinsing, drying)?: A Little Help from another person to put on and taking off regular upper body clothing?: A Little Help from another person to put on and taking off regular lower body clothing?: A Little 6 Click Score: 19   End of Session Equipment Utilized During Treatment: Gait belt  Activity Tolerance: Patient tolerated treatment well Patient left: in chair;with call bell/phone within reach;with chair alarm set;with nursing/sitter in room  OT Visit Diagnosis: Unsteadiness on feet (R26.81);Muscle weakness (generalized) (M62.81);Other symptoms and signs involving cognitive function;Repeated falls (R29.6)                Time: [id number] OT Time Calculation (min): 42 min Charges:  OT General Charges $OT Visit: 1 Visit OT Evaluation $OT Re-eval: 1 Re-eval OT Treatments $Self Care/Home Management : 38-52 mins  Myrtie Hawk Adeli Frost, OTR/L 07/08/19, 2:35 PM

## 2019-07-08 NOTE — Progress Notes (Signed)
End of Shift Summary:  Date: 5/26-5/27 Shift: 2300-0700 Ambulatory: up with standby assist  Significant Events: 1:1 sitter in place overnight. Patient restful throughout shift.

## 2019-07-09 ENCOUNTER — Inpatient Hospital Stay: Payer: Medicaid Other

## 2019-07-09 LAB — BASIC METABOLIC PANEL
Anion gap: 9 (ref 5–15)
BUN: 31 mg/dL — ABNORMAL HIGH (ref 8–23)
CO2: 31 mmol/L (ref 22–32)
Calcium: 9.4 mg/dL (ref 8.9–10.3)
Chloride: 101 mmol/L (ref 98–111)
Creatinine, Ser: 0.89 mg/dL (ref 0.61–1.24)
GFR calc Af Amer: >60 mL/min (ref >60)
GFR calc non Af Amer: >60 mL/min (ref >60)
Glucose, Bld: 98 mg/dL (ref 70–99)
Potassium: 3.6 mmol/L (ref 3.5–5.1)
Sodium: 141 mmol/L (ref 135–145)

## 2019-07-09 LAB — CBC
HCT: 32.5 % — ABNORMAL LOW (ref 39.0–52.0)
Hemoglobin: 11.4 g/dL — ABNORMAL LOW (ref 13.0–17.0)
MCH: 30.6 pg (ref 26.0–34.0)
MCHC: 35.1 g/dL (ref 30.0–36.0)
MCV: 87.1 fL (ref 80.0–100.0)
Platelets: 106 10*3/uL — ABNORMAL LOW (ref 150–400)
RBC: 3.73 MIL/uL — ABNORMAL LOW (ref 4.22–5.81)
RDW: 14.1 % (ref 11.5–15.5)
WBC: 3.1 10*3/uL — ABNORMAL LOW (ref 4.0–10.5)
nRBC: 0 % (ref 0.0–0.2)

## 2019-07-09 MED ORDER — IBUPROFEN 400 MG PO TABS
200.0000 mg | ORAL_TABLET | Freq: Four times a day (QID) | ORAL | Status: DC | PRN
  Administered 2019-07-09: 200 mg via ORAL
  Filled 2019-07-09: qty 1

## 2019-07-09 NOTE — TOC Progression Note (Signed)
Transition of Care [facility]) - Progression Note    Patient Details  Name: Peter Parsons MRN: [id number] Date of Birth: [birth year]  [facility] of [name] CM/SW Contact  Shelbie Ammons, RN Phone Number: 07/09/2019, 1:36 PM  Clinical Narrative:   RNCM communicated with Ulice Dash from [facility], per Ulice Dash everything should be on for patient to come in on Monday. He has sent over the admission packet for patient's guardian to complete and all that is needed from the hospital is a negative Covid test, a copy of his vaccination record and if possible a chest x-ray to show negative TB. He reports that they like to admit people during the 10-12 hour and request that arrival try to be timed for then.  RNCM reported needs to attending, CXR and Covid will be ordered.  RNCM reached out to [facility] to arrange for transport, pick up is scheduled for 10am on Monday. Notified attending as well as charge nurse.     Expected Discharge Plan: Group Home Barriers to Discharge: SNF Pending bed offer  Expected Discharge Plan and Services Expected Discharge Plan: Group Home   Discharge Planning Services: CM Consult   Living arrangements for the past 2 months: Group Home                 DME Arranged: N/A DME Agency: [employer] Date DME Agency Contacted: 04/12/19 Time DME Agency Contacted: [phone] Representative spoke with at DME Agency: none needed [place] Arranged: NA           Social Determinants of Health ([place]) Interventions    Readmission Risk Interventions Readmission Risk Prevention Plan 04/27/2019 04/20/2019  Transportation Screening Complete Complete  PCP or Specialist Appt within 3-5 Days - Not Complete  Not Complete comments - not ready to [place] or Home Care Consult - Not Complete  HRI or Home Care Consult comments - not ready to[place], looking for a bed for placement  Social Work Consult for [place] Planning/Counseling - [place] - Not  Applicable  Medication Review (RN Care Manager) Referral to Pharmacy Referral to Pharmacy  PCP or Specialist appointment within 3-5 days of discharge Complete -  [place] or [place] Complete -  [place] Not Applicable -
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
## 2019-07-09 NOTE — Plan of Care (Signed)
  Problem: Education: Goal: Knowledge of General Education information will improve Description: Including pain rating scale, medication(s)/side effects and non-pharmacologic comfort measures Outcome: Progressing   Problem: Activity: Goal: Risk for activity intolerance will decrease Outcome: Progressing   Problem: Nutrition: Goal: Adequate nutrition will be maintained Outcome: Progressing   Problem: Pain Managment: Goal: General experience of comfort will improve Outcome: Progressing   Problem: Safety: Goal: Ability to remain free from injury will improve Outcome: Progressing   Problem: Skin Integrity: Goal: Risk for impaired skin integrity will decrease Outcome: Progressing   Problem: Clinical Measurements: Goal: Ability to maintain clinical measurements within normal limits will improve Outcome: Progressing Goal: Will remain free from infection Outcome: Progressing Goal: Diagnostic test results will improve Outcome: Progressing Goal: Respiratory complications will improve Outcome: Progressing Goal: Cardiovascular complication will be avoided Outcome: Progressing   Problem: Elimination: Goal: Will not experience complications related to bowel motility Outcome: Progressing   Problem: Pain Managment: Goal: General experience of comfort will improve Outcome: Progressing   Problem: Safety: Goal: Ability to remain free from injury will improve Outcome: Progressing   Problem: Skin Integrity: Goal: Risk for impaired skin integrity will decrease Outcome: Progressing   Problem: Education: Goal: Knowledge of disease or condition will improve Outcome: Progressing Goal: Knowledge of secondary prevention will improve Outcome: Progressing Goal: Knowledge of patient specific risk factors addressed and post discharge goals established will improve Outcome: Progressing   Problem: Nutrition: Goal: Risk of aspiration will decrease Outcome: Progressing   Problem:  Intracerebral Hemorrhage Tissue Perfusion: Goal: Complications of Intracerebral Hemorrhage will be minimized Outcome: Progressing
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
## 2019-07-11 NOTE — Plan of Care (Signed)
  Problem: Education: Goal: Knowledge of General Education information will improve Description: Including pain rating scale, medication(s)/side effects and non-pharmacologic comfort measures Outcome: Adequate for Discharge   Problem: Activity: Goal: Risk for activity intolerance will decrease Outcome: Adequate for Discharge   Problem: Nutrition: Goal: Adequate nutrition will be maintained Outcome: Adequate for Discharge   Problem: Pain Managment: Goal: General experience of comfort will improve Outcome: Adequate for Discharge   Problem: Safety: Goal: Ability to remain free from injury will improve Outcome: Adequate for Discharge   Problem: Skin Integrity: Goal: Risk for impaired skin integrity will decrease Outcome: Adequate for Discharge   Problem: Education: Goal: Knowledge of General Education information will improve Description: Including pain rating scale, medication(s)/side effects and non-pharmacologic comfort measures Outcome: Adequate for Discharge   Problem: Clinical Measurements: Goal: Ability to maintain clinical measurements within normal limits will improve Outcome: Adequate for Discharge Goal: Will remain free from infection Outcome: Adequate for Discharge Goal: Diagnostic test results will improve Outcome: Adequate for Discharge Goal: Respiratory complications will improve Outcome: Adequate for Discharge Goal: Cardiovascular complication will be avoided Outcome: Adequate for Discharge   Problem: Elimination: Goal: Will not experience complications related to bowel motility Outcome: Adequate for Discharge   Problem: Pain Managment: Goal: General experience of comfort will improve Outcome: Adequate for Discharge   Problem: Safety: Goal: Ability to remain free from injury will improve Outcome: Adequate for Discharge   Problem: Skin Integrity: Goal: Risk for impaired skin integrity will decrease Outcome: Adequate for Discharge   Problem:  Education: Goal: Knowledge of disease or condition will improve Outcome: Adequate for Discharge Goal: Knowledge of secondary prevention will improve Outcome: Adequate for Discharge Goal: Knowledge of patient specific risk factors addressed and post discharge goals established will improve Outcome: Adequate for Discharge   Problem: Nutrition: Goal: Risk of aspiration will decrease Outcome: Adequate for Discharge   Problem: Intracerebral Hemorrhage Tissue Perfusion: Goal: Complications of Intracerebral Hemorrhage will be minimized Outcome: Adequate for Discharge   Problem: Ischemic Stroke/TIA Tissue Perfusion: Goal: Complications of ischemic stroke/TIA will be minimized Outcome: Adequate for Discharge
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
## 2019-07-12 LAB — SARS CORONAVIRUS 2 BY RT PCR (HOSPITAL ORDER, PERFORMED IN ~~LOC~~ HOSPITAL LAB): SARS Coronavirus 2: NEGATIVE

## 2019-07-12 MED ORDER — CLONAZEPAM 0.5 MG PO TABS: 0.2500 mg | ORAL_TABLET | Freq: Three times a day (TID) | ORAL | 0 refills | Status: DC | PRN

## 2019-07-12 MED ORDER — TRAZODONE HCL 50 MG PO TABS: 50.0000 mg | ORAL_TABLET | Freq: Every day | ORAL | 0 refills | Status: DC

## 2019-07-12 MED ORDER — CHLORHEXIDINE GLUCONATE 0.12 % MT SOLN: 15.0000 mL | Freq: Two times a day (BID) | OROMUCOSAL | 0 refills | Status: DC

## 2019-07-12 MED ORDER — ZIPRASIDONE HCL 60 MG PO CAPS: 60.0000 mg | ORAL_CAPSULE | Freq: Every day | ORAL | 0 refills | Status: DC

## 2019-07-12 MED ORDER — ESCITALOPRAM OXALATE 10 MG PO TABS: 10.0000 mg | ORAL_TABLET | Freq: Every day | ORAL | 0 refills | Status: DC

## 2019-07-12 MED ORDER — IBUPROFEN 200 MG PO TABS: 200.0000 mg | ORAL_TABLET | Freq: Four times a day (QID) | ORAL | 0 refills | Status: DC | PRN

## 2019-07-12 MED ORDER — ZIPRASIDONE HCL 40 MG PO CAPS: 40.0000 mg | ORAL_CAPSULE | Freq: Every day | ORAL | 0 refills | Status: DC

## 2019-07-12 MED ORDER — NEPRO/CARBSTEADY PO LIQD: 237.0000 mL | Freq: Three times a day (TID) | ORAL | 0 refills | Status: AC

## 2019-07-12 MED ORDER — SENNOSIDES-DOCUSATE SODIUM 8.6-50 MG PO TABS: 2.0000 | ORAL_TABLET | Freq: Two times a day (BID) | ORAL | 0 refills | Status: DC

## 2019-07-12 MED ORDER — ADULT MULTIVITAMIN W/MINERALS CH: 1.0000 | ORAL_TABLET | Freq: Every day | ORAL | 0 refills | Status: AC

## 2019-07-12 MED ORDER — POLYETHYLENE GLYCOL 3350 17 G PO PACK: 17.0000 g | PACK | Freq: Two times a day (BID) | ORAL | 0 refills | Status: DC

## 2019-07-12 MED ORDER — NYSTATIN 100000 UNIT/GM EX CREA: TOPICAL_CREAM | Freq: Two times a day (BID) | CUTANEOUS | 0 refills | Status: DC

## 2019-07-12 NOTE — TOC Transition Note (Signed)
Transition of Care [facility]) - CM/SW Discharge Note   Patient Details  Name: Peter Parsons MRN: [id number] Date of Birth: 02-Apr-1960  Transition of Care [facility]) CM/SW Contact:  Shelbie Hutching, RN Phone Number: 07/12/2019, 10:16 AM   Clinical Narrative:    [facility] is here to pick patient up.  COVID back Negative, copy printed and sent in discharge packet.  COVID vaccine info, chest x ray and FL2 all printed and put into discharge packet.  Patient transported off the floor by stretcher to discharge.    Final next level of care: Rest Home Barriers to Discharge: Barriers Resolved   Patient Goals and CMS Choice Patient states their goals for this hospitalization and ongoing recovery are:: get out of here      Discharge Placement              Patient chooses bed at: [facility]) Patient to be transferred to facility by: [facility] Name of family member notified: Legal Guardian [name] Patient and family notified of of transfer: 07/12/19  Discharge Plan and Services   Discharge Planning Services: CM Consult            DME Arranged: N/A DME Agency: [employer] Date DME Agency Contacted: 04/12/19 Time DME Agency Contacted: [phone] Representative spoke with at DME Agency: none needed [place] Arranged: NA          Social Determinants of Health ([place]) Interventions     Readmission Risk Interventions Readmission Risk Prevention Plan 04/27/2019 04/20/2019  Transportation Screening Complete Complete  PCP or Specialist Appt within 3-5 Days - Not Complete  Not Complete comments - not ready to [place] or Home Care Consult - Not Complete  HRI or Home Care Consult comments - not ready toDC, looking for a bed for placement  Social Work Consult for [place] Planning/Counseling - [place] - Not Applicable  Medication Review (RN Care Manager) Referral to Pharmacy Referral to Pharmacy  PCP or Specialist appointment  within 3-5 days of discharge Complete -  [place] or [place] Complete -  [place] Not Applicable -

## 2019-07-12 NOTE — NC FL2 (Signed)
[place] LEVEL OF CARE SCREENING TOOL     IDENTIFICATION  Patient Name: Peter Parsons Birthdate: 02/03/1961 Sex: male Admission Date (Current Location): 02/11/2019  [place] and [place] Number:  [name] [id number] [place] and Address:  [facility], [address], [place], [place] [address]      Provider Number: [id number]  Attending Physician Name and Address:  [facility]*  Relative Name and Phone Number:  [name] [phone] legal guardian    Current Level of Care: Hospital Recommended Level of Care: Other (Comment)(Rest Home) Prior Approval Number:    Date Approved/Denied:   PASRR Number: [id number]  Discharge Plan: Domiciliary (Rest home)    Current Diagnoses: Patient Active Problem List   Diagnosis Date Noted  . Enterococcal bacteremia 05/15/2019  . Hypertension 05/15/2019  . Type 2 diabetes mellitus ([place]) 05/15/2019  . Dyslipidemia 05/15/2019  . BPH (benign prostatic hyperplasia) 05/15/2019  . Normocytic anemia 05/15/2019  . Thrombocytopenia ([place]) 05/15/2019  . Acute urinary retention 05/15/2019  . Urethral trauma 05/15/2019  . UTI (urinary tract infection) 05/15/2019  . Pressure injury of skin 04/13/2019  . Acute metabolic encephalopathy [id number]  . Protein-calorie malnutrition, severe 03/29/2019  . Weakness 03/27/2019  . Dementia ([place]) 03/19/2019  . Altered mental status   . Paranoid schizophrenia ([place])     Orientation RESPIRATION BLADDER Height & Weight     Self, Time, Place, Situation  Normal Continent Weight: 92.5 kg Height:  5\' 9"  (175.3 cm)  BEHAVIORAL SYMPTOMS/MOOD NEUROLOGICAL BOWEL NUTRITION STATUS  [place]   Continent Diet(Dysphagia 3)  AMBULATORY STATUS COMMUNICATION OF NEEDS Skin   Independent Verbally PU Stage and Appropriate Care     PU Stage 3 Dressing: Daily                 Personal Care Assistance Level of Assistance  Bathing, Dressing Bathing Assistance:  Limited assistance   Dressing Assistance: Limited assistance     Functional Limitations Info  Sight, Hearing, Speech Sight Info: Adequate Hearing Info: Adequate Speech Info: Adequate    SPECIAL CARE FACTORS FREQUENCY  PT (By licensed PT), OT (By licensed OT)     PT Frequency: No PT follow up              Contractures Contractures Info: Not present    Additional Factors Info  Code Status, Allergies Code Status Info: Full Allergies Info: Acetaminophen           Current Medications (07/12/2019):  This is the current hospital active medication list Current Facility-Administered Medications  Medication Dose Route Frequency Provider Last Rate Last Admin  . atorvastatin (LIPITOR) tablet 10 mg  10 mg Oral Daily Carrie Mew, MD   10 mg at 07/11/19 1452  . chlorhexidine (PERIDEX) 0.12 % solution 15 mL  15 mL Mouth/Throat BID Lavina Hamman, MD   15 mL at 07/11/19 2030  . clonazePAM (KLONOPIN) tablet 0.25 mg  0.25 mg Oral TID PRN Dixie Dials, MD   0.25 mg at 07/12/19 0809  . dextrose 50 % solution 50 mL  1 ampule Intravenous PRN Lavina Hamman, MD      . escitalopram (LEXAPRO) tablet 10 mg  10 mg Oral Daily Cristofano, Dorene Ar, MD   10 mg at 07/11/19 1454  . feeding supplement (NEPRO CARB STEADY) liquid 237 mL  237 mL Oral TID BM Enzo Bi, MD 0 mL/hr at 07/11/19 2030 237 mL at 07/12/19 0843  . gabapentin (NEURONTIN) capsule 300 mg  300 mg  Oral TID Carrie Mew, MD   300 mg at 07/12/19 [id number]  . haloperidol lactate (HALDOL) injection 2 mg  2 mg Intramuscular Q4H PRN Rito Ehrlich A, RPH   2 mg at 07/11/19 1808  . ibuprofen (ADVIL) tablet 200 mg  200 mg Oral Q6H PRN Lang Snow, NP   200 mg at 07/09/19 2331  . metoprolol tartrate (LOPRESSOR) injection 5 mg  5 mg Intravenous Q12H PRN Wyvonnia Dusky, MD   5 mg at 05/14/19 1011  . multivitamin with minerals tablet 1 tablet  1 tablet Oral Daily Cristofano, Dorene Ar, MD   1 tablet at 07/11/19 1453  .  nystatin cream (MYCOSTATIN)   Topical BID Lorella Nimrod, MD   Given at 07/11/19 2031  . polyethylene glycol (MIRALAX / GLYCOLAX) packet 17 g  17 g Oral BID Enzo Bi, MD   17 g at 07/11/19 1453  . senna-docusate (Senokot-S) tablet 2 tablet  2 tablet Oral BID Lorella Nimrod, MD   2 tablet at 07/11/19 2029  . tamsulosin (FLOMAX) capsule 0.4 mg  0.4 mg Oral Daily Fritzi Mandes, MD   0.4 mg at 07/11/19 1453  . traZODone (DESYREL) tablet 50 mg  50 mg Oral QHS Alesia Morin, MD   50 mg at 07/11/19 2030  . ziprasidone (GEODON) capsule 40 mg  40 mg Oral Q2000 Alesia Morin, MD   40 mg at 07/12/19 0843  . ziprasidone (GEODON) capsule 60 mg  60 mg Oral Q2000 Alesia Morin, MD   60 mg at 07/11/19 2030     Discharge Medications:   Medication List    STOP taking these medications   lisinopril 20 MG tablet Commonly known as: ZESTRIL   metFORMIN 500 MG tablet Commonly known as: GLUCOPHAGE   paliperidone 234 MG/1.5ML Susy injection Commonly known as: INVEGA SUSTENNA   risperiDONE 3 MG tablet Commonly known as: RISPERDAL     TAKE these medications   atorvastatin 10 MG tablet Commonly known as: LIPITOR Take 10 mg by mouth daily.   chlorhexidine 0.12 % solution Commonly known as: PERIDEX Use as directed 15 mLs in the mouth or throat 2 (two) times daily.   clonazePAM 0.5 MG tablet Commonly known as: KLONOPIN Take 0.5 tablets (0.25 mg total) by mouth 3 (three) times daily as needed (anxiety).   escitalopram 10 MG tablet Commonly known as: LEXAPRO Take 1 tablet (10 mg total) by mouth daily.   feeding supplement (NEPRO CARB STEADY) Liqd Take 237 mLs by mouth 3 (three) times daily between meals.   gabapentin 300 MG capsule Commonly known as: NEURONTIN Take 300 mg by mouth 3 (three) times daily.   ibuprofen 200 MG tablet Commonly known as: ADVIL Take 1 tablet (200 mg total) by mouth every 6 (six) hours as needed for fever, headache or moderate pain.   multivitamin with  minerals Tabs tablet Take 1 tablet by mouth daily.   nystatin cream Commonly known as: MYCOSTATIN Apply topically 2 (two) times daily.   polyethylene glycol 17 g packet Commonly known as: MIRALAX / GLYCOLAX Take 17 g by mouth 2 (two) times daily.   senna-docusate 8.6-50 MG tablet Commonly known as: Senokot-S Take 2 tablets by mouth 2 (two) times daily.   tamsulosin 0.4 MG Caps capsule Commonly known as: FLOMAX Take 0.4 mg by mouth daily.   traZODone 50 MG tablet Commonly known as: DESYREL Take 1 tablet (50 mg total) by mouth at bedtime. What changed:   medication strength  how much to take  ziprasidone 40 MG capsule Commonly known as: GEODON Take 1 capsule (40 mg total) by mouth daily at 8 pm.   ziprasidone 60 MG capsule Commonly known as: GEODON Take 1 capsule (60 mg total) by mouth daily at 8 pm.      Relevant Imaging Results:  Relevant Lab Results:   Additional Information SSN: [id number]  Shelbie Hutching, RN
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
## 2020-10-03 IMAGING — CR DG CHEST 2V
2 series · 2 of 2 positions shown · non-contrast
Comparison: 02/12/2019

CLINICAL DATA: Fever.  Unresponsive.

EXAM:
CHEST - 2 VIEW

[chest lat]
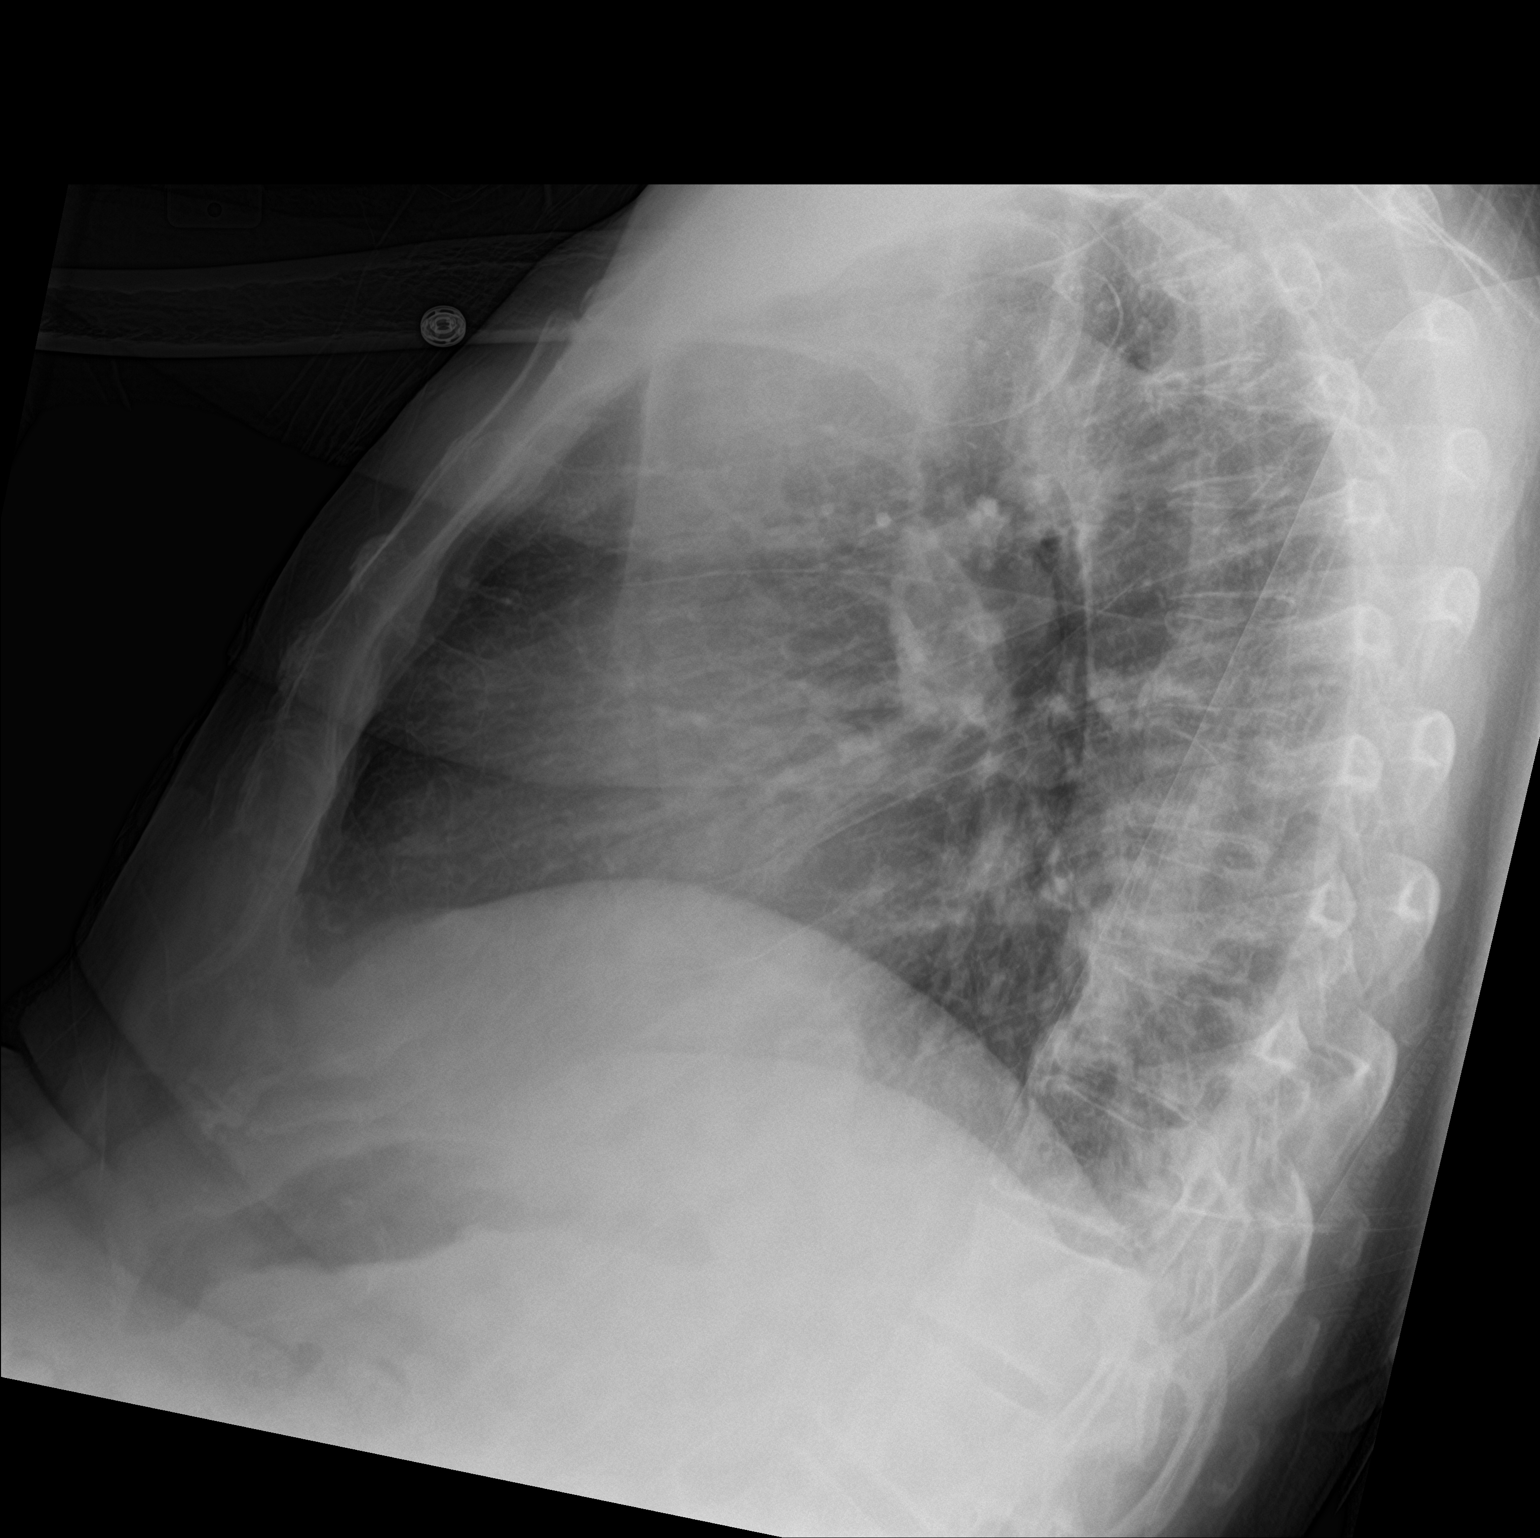

[chest ap]
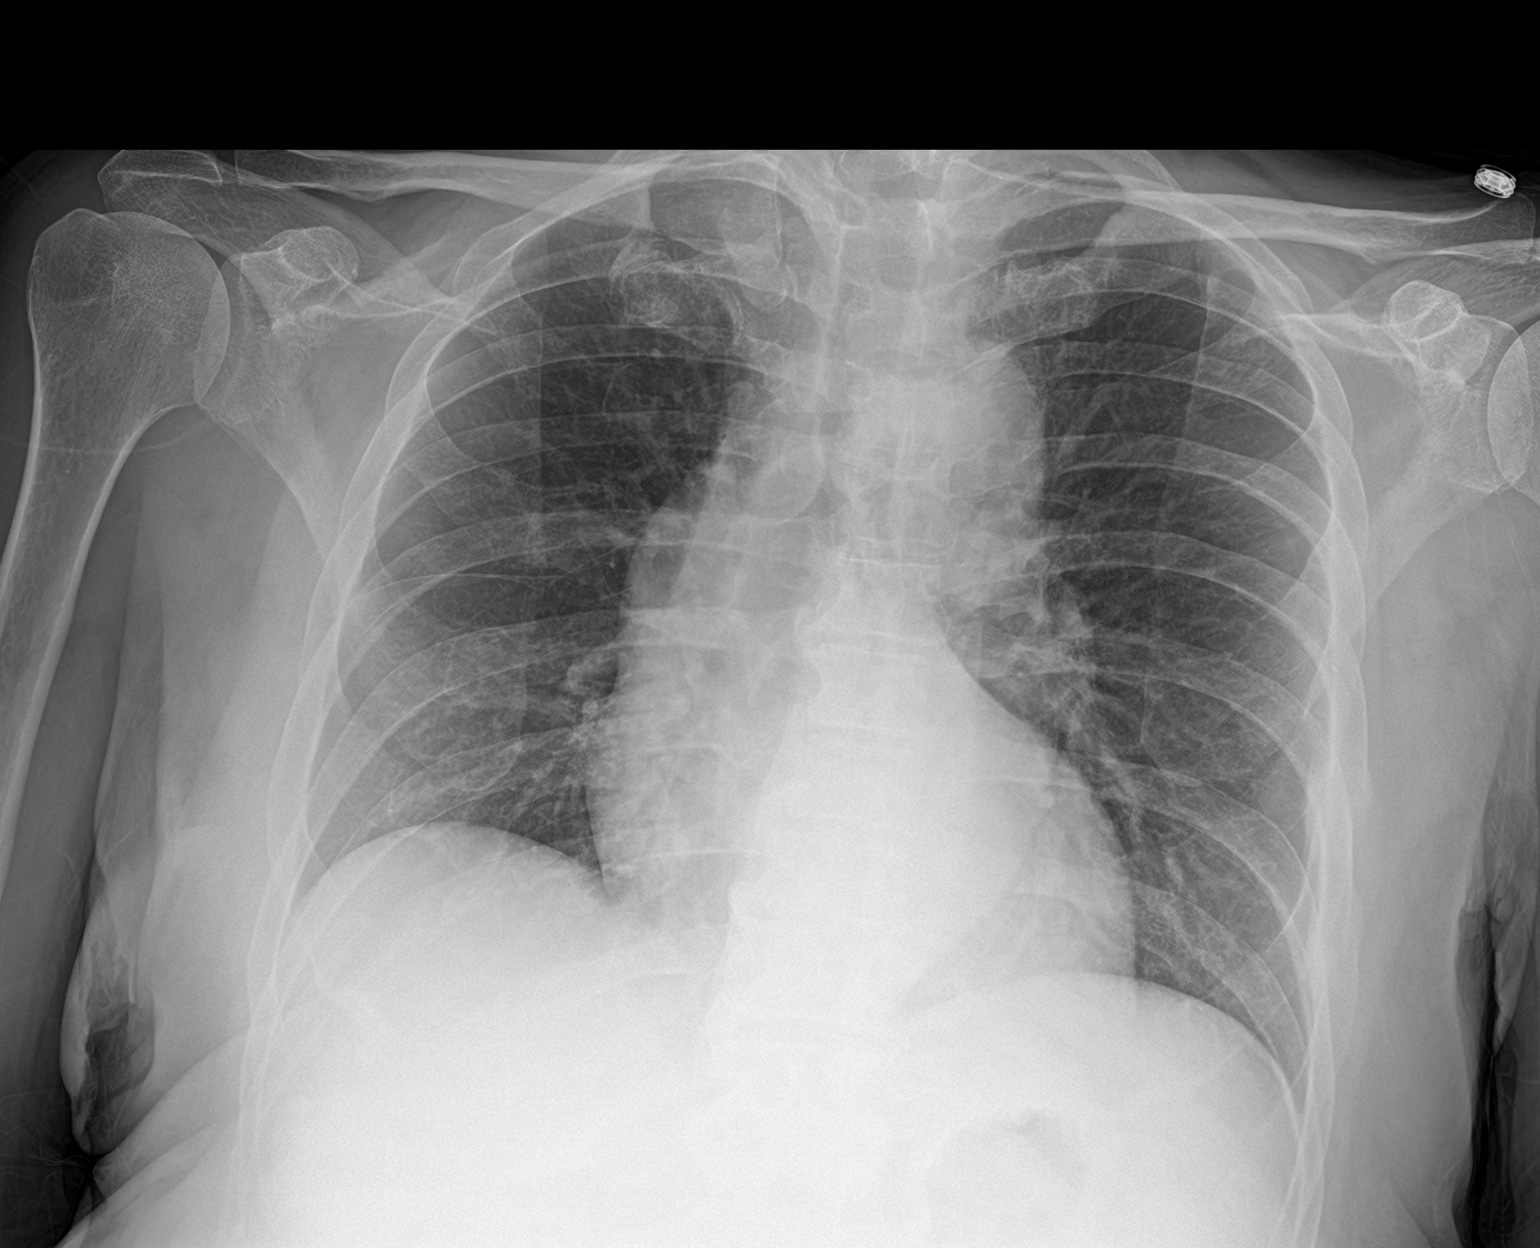

[2 of 2 positions shown; findings below may reference images not displayed]

FINDINGS: The heart size and mediastinal contours are within normal limits.
Both lungs are clear. The visualized skeletal structures are
unremarkable.
IMPRESSION: No active cardiopulmonary disease.

## 2020-10-19 IMAGING — DX DG CHEST 1V PORT
1 series · 2 of 2 positions shown · non-contrast
Comparison: Portable exam 0661 hours compared to 04/28/2019

CLINICAL DATA: Fever, diabetes mellitus, hypertension, former
smoker

EXAM:
PORTABLE CHEST 1 VIEW

[Series 1: chest ap · 0.14mm/px · 2 of 2 slices shown]
[im 1/2]
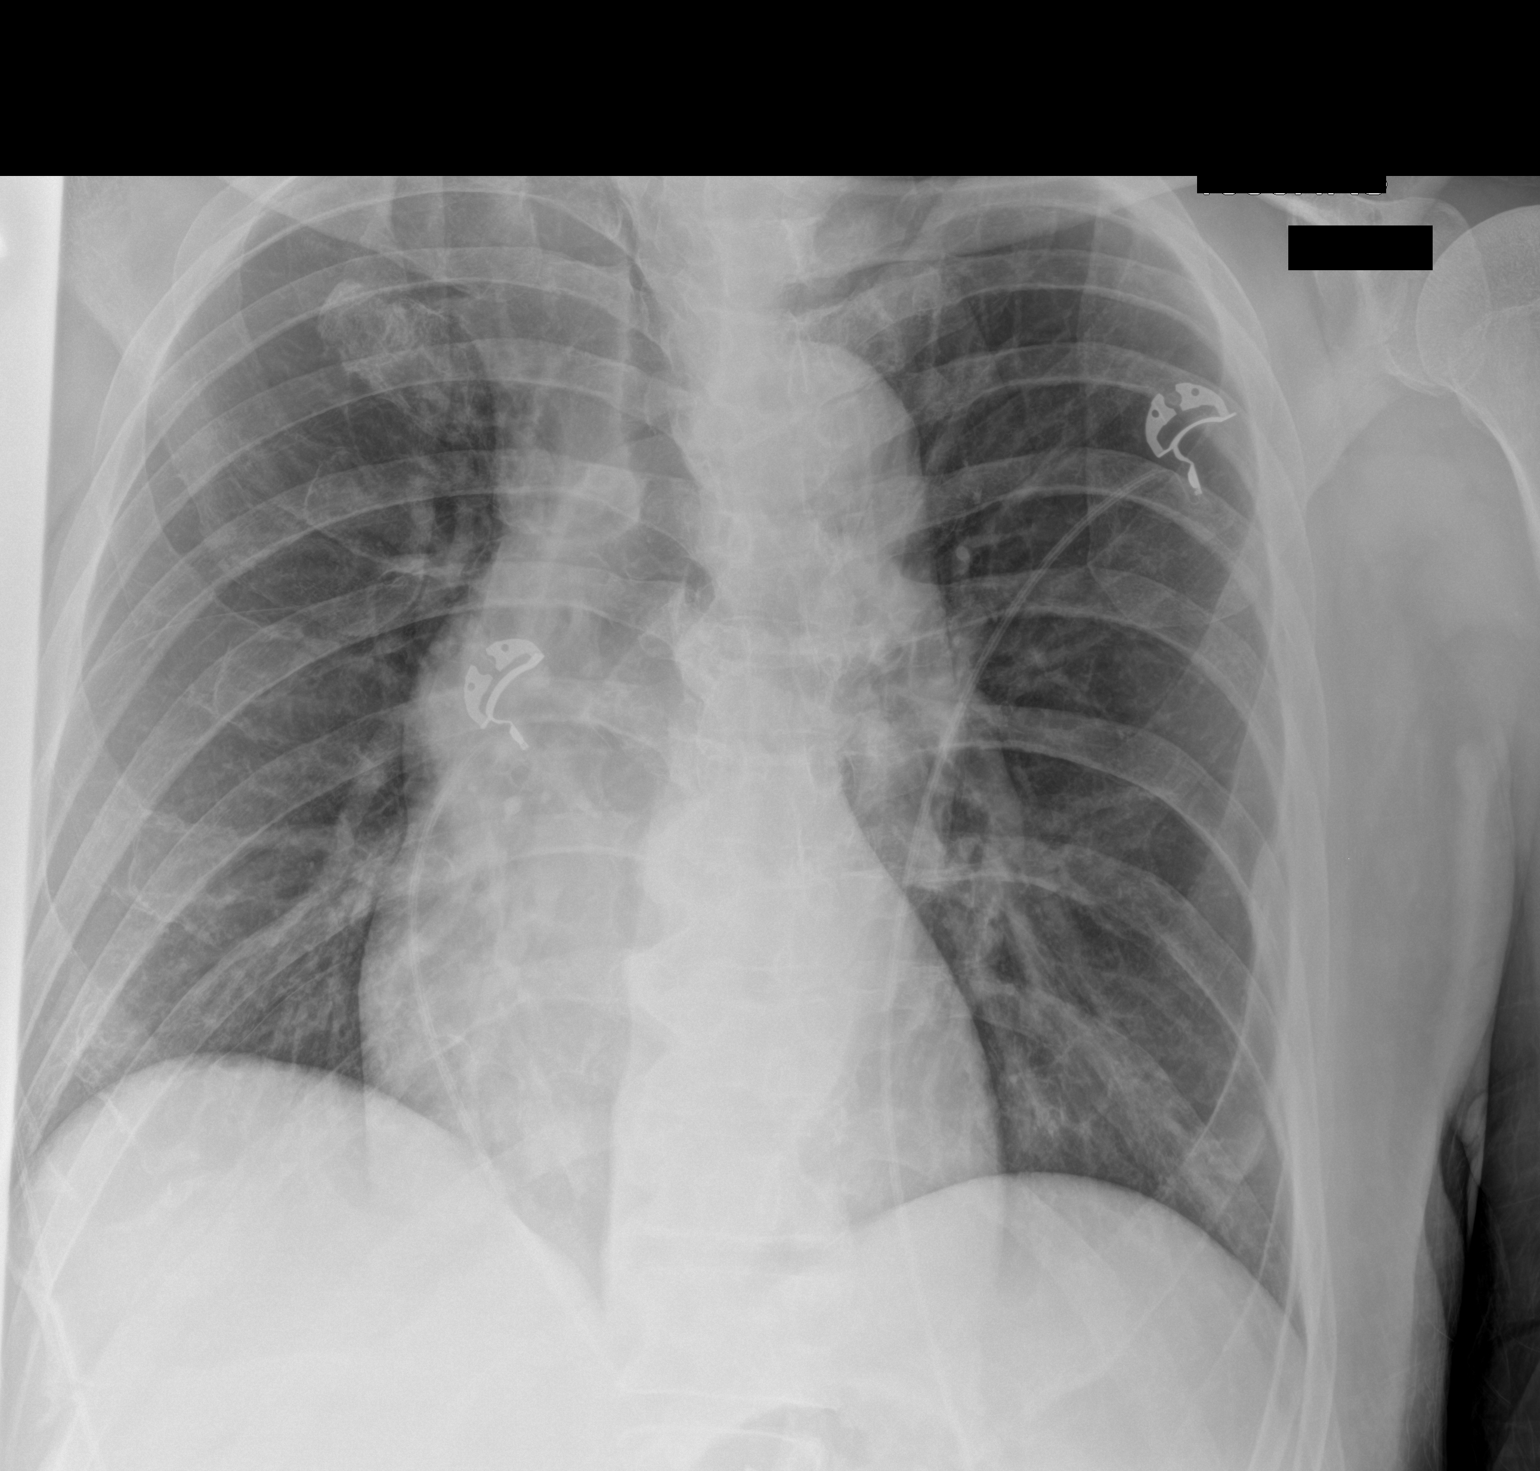
[im 2/2]
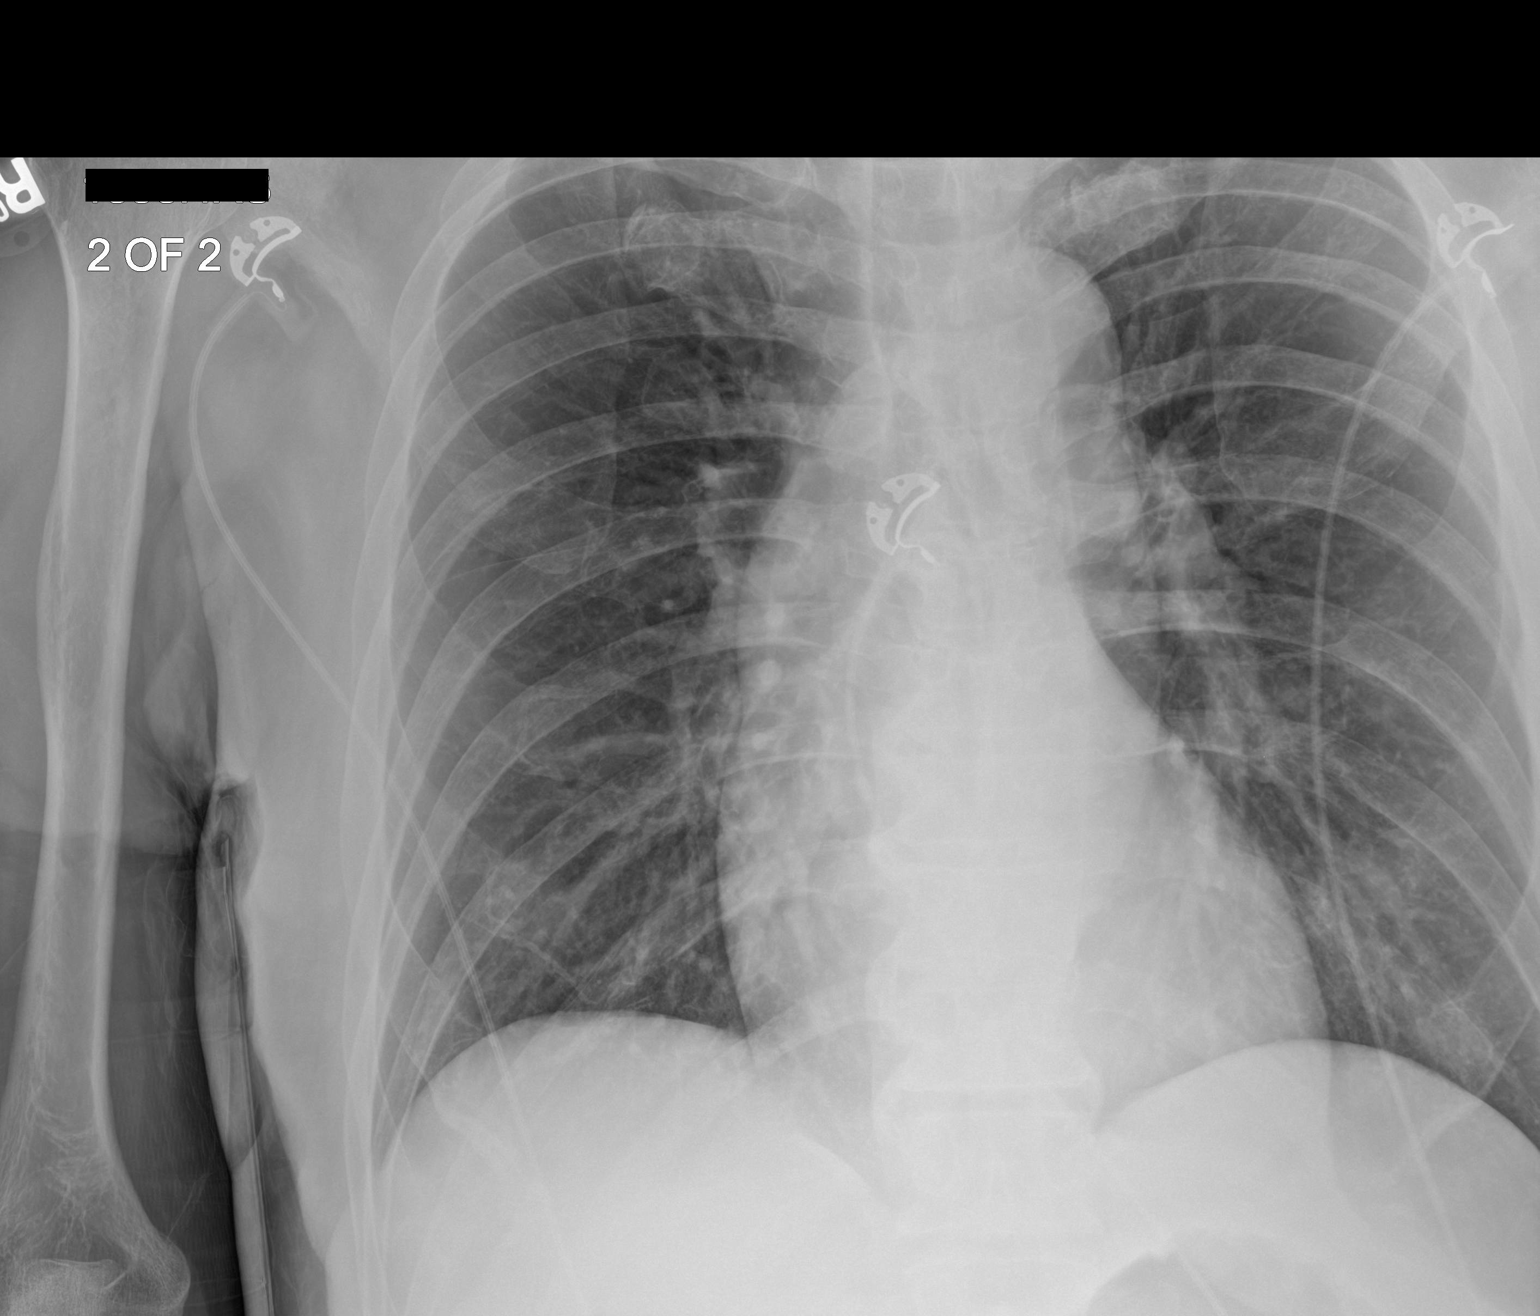

[2 of 2 positions shown; findings below may reference images not displayed]

FINDINGS: Normal heart size, mediastinal contours, and pulmonary vascularity.

Tortuous aorta.

Lungs clear.

No infiltrate, pleural effusion or pneumothorax.

Suspect LEFT nipple shadow.

Osseous structures unremarkable
IMPRESSION: No acute abnormalities.

Suspected LEFT nipple shadow; follow-up upright PA chest radiograph
with nipple markers recommended to exclude pulmonary nodule.

## 2020-10-20 IMAGING — CT CT ABD-PELV W/O CM
2 of 4 series · 16 of 46 positions shown, 18 images · non-contrast
Comparison: 03/20/2019

CLINICAL DATA: Hematuria and decreased hemoglobin

EXAM:
CT ABDOMEN AND PELVIS WITHOUT CONTRAST
TECHNIQUE: Multidetector CT imaging of the abdomen and pelvis was performed
following the standard protocol without IV contrast.

[Series 2: routine abd/pel wo · axial · 0.83mm/px · z∈[-1236,-746]mm · 13 of 108 slices shown, 15 images]
[im 5/108  soft-tissue]
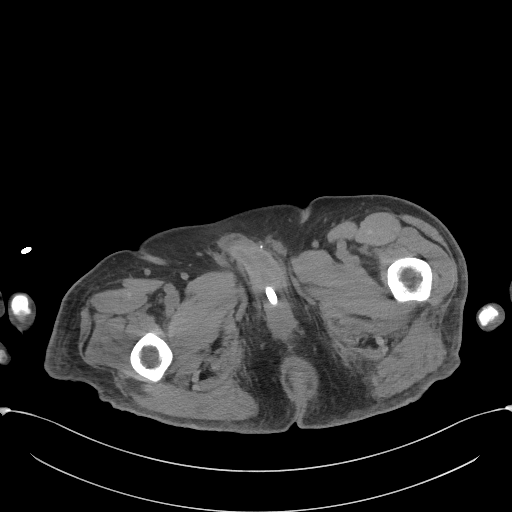
[im 5/108  bone]
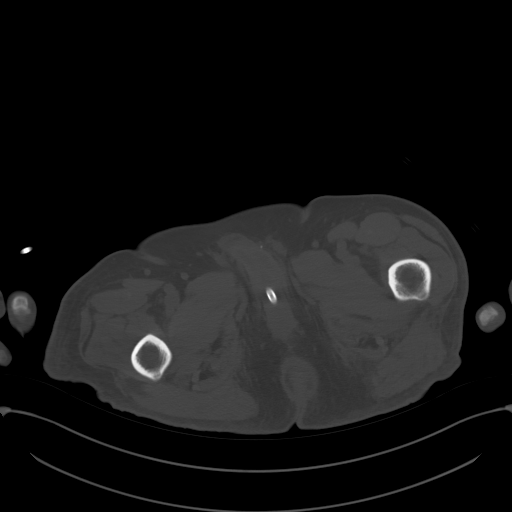
[im 13/108  soft-tissue]
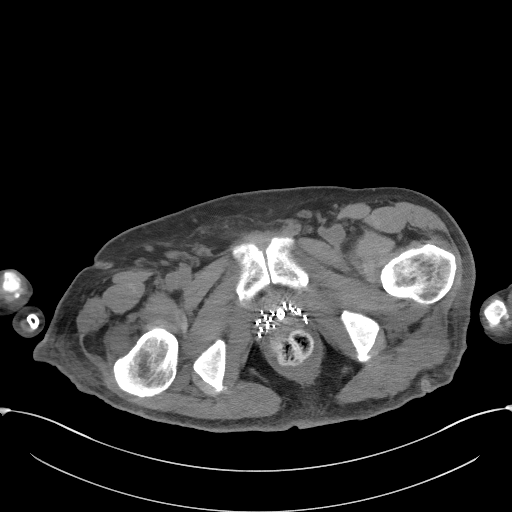
[im 22/108  soft-tissue]
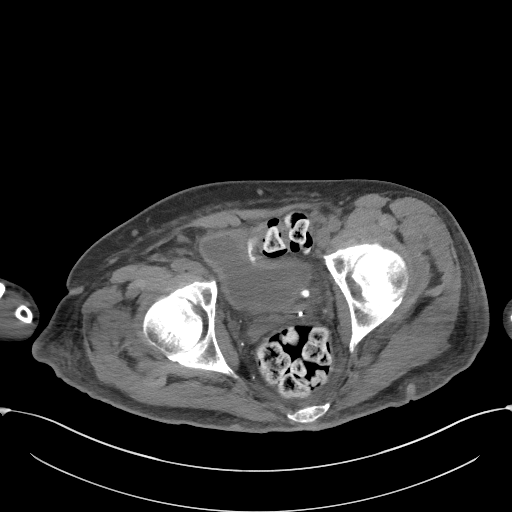
[im 30/108  soft-tissue]
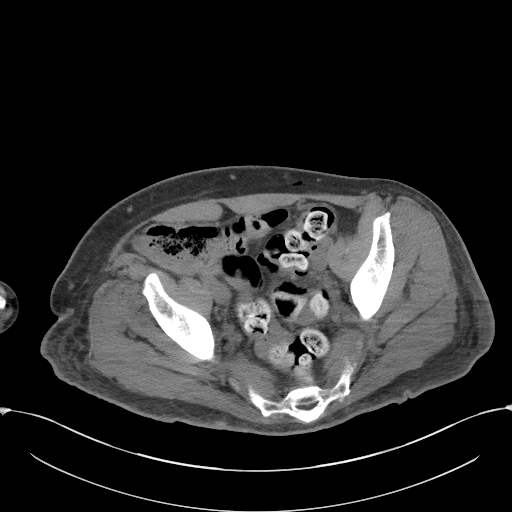
[im 39/108  soft-tissue]
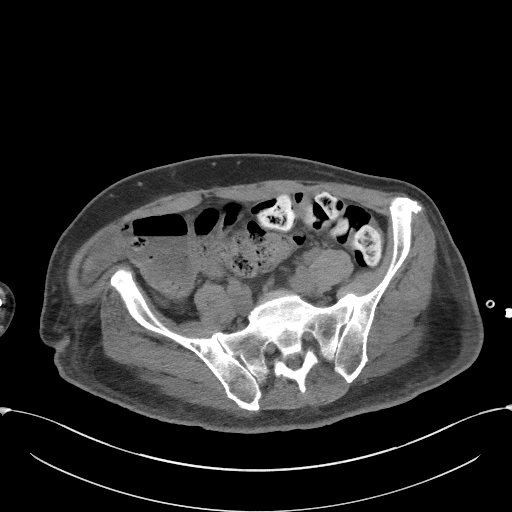
[im 48/108  soft-tissue]
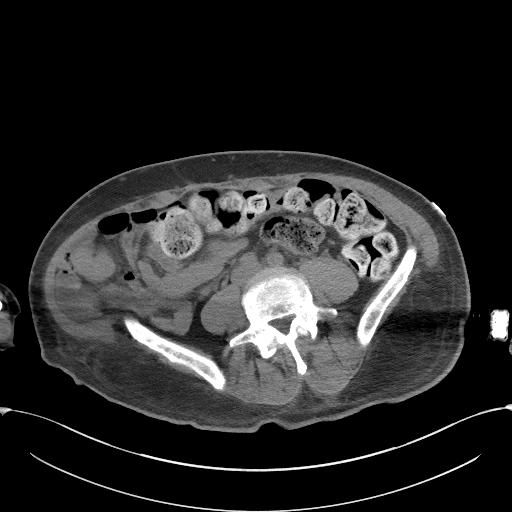
[im 56/108  soft-tissue]
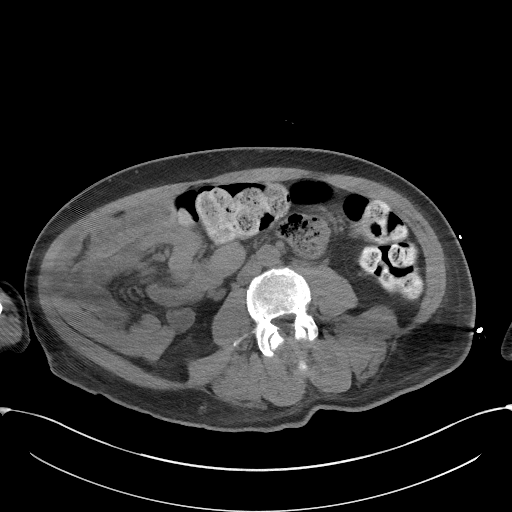
[im 60/108  soft-tissue]
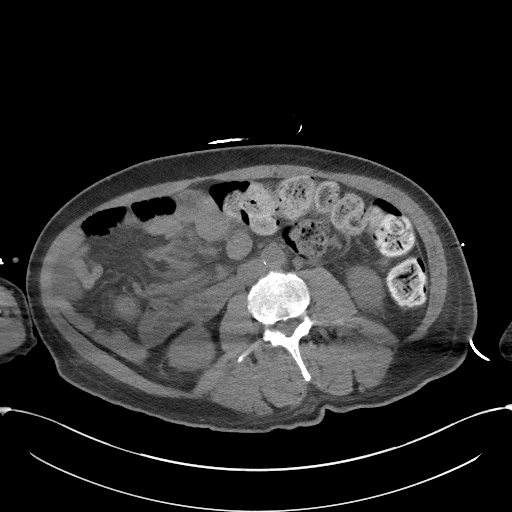
[im 69/108  soft-tissue]
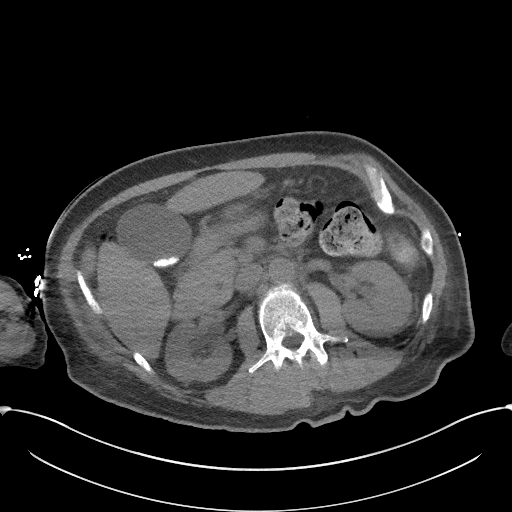
[im 69/108  bone]
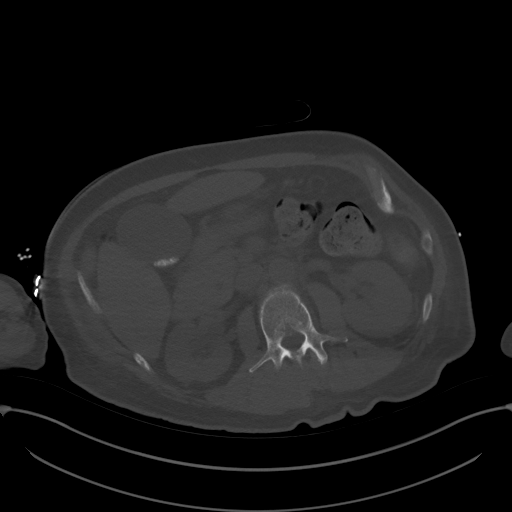
[im 78/108  soft-tissue]
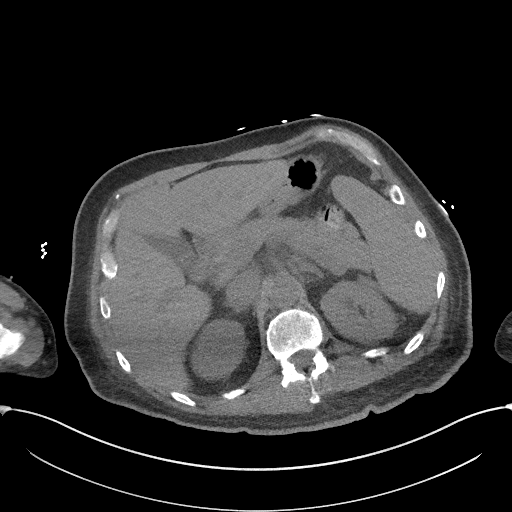
[im 86/108  soft-tissue]
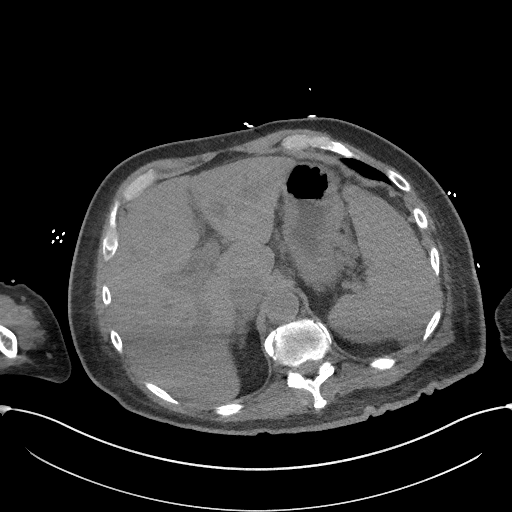
[im 95/108  soft-tissue]
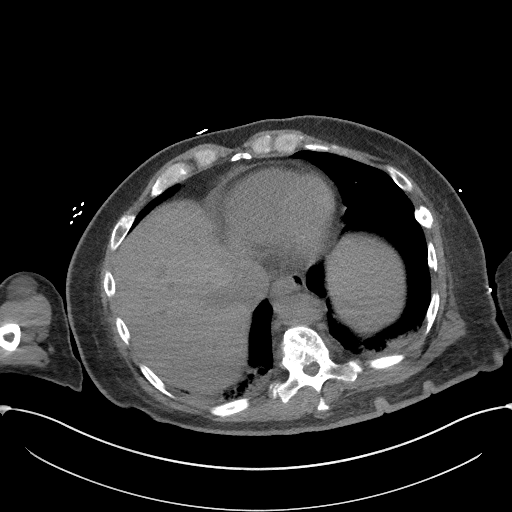
[im 103/108  soft-tissue]
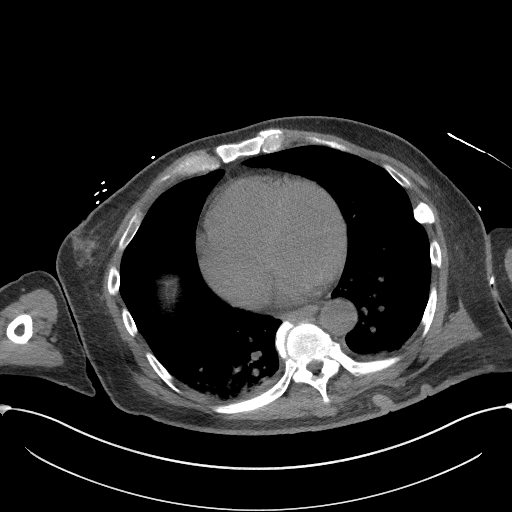

[Series 5: coronal st · coronal · 0.77mm/px · 3 of 87 slices shown]
[im 29/87  soft-tissue]
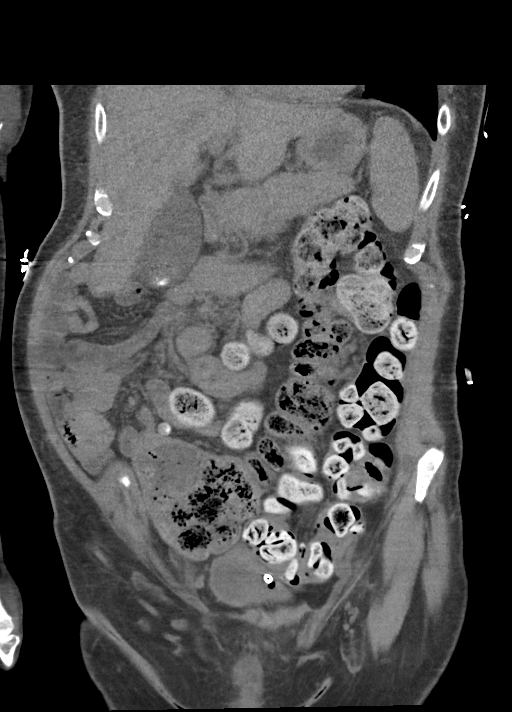
[im 39/87  soft-tissue]
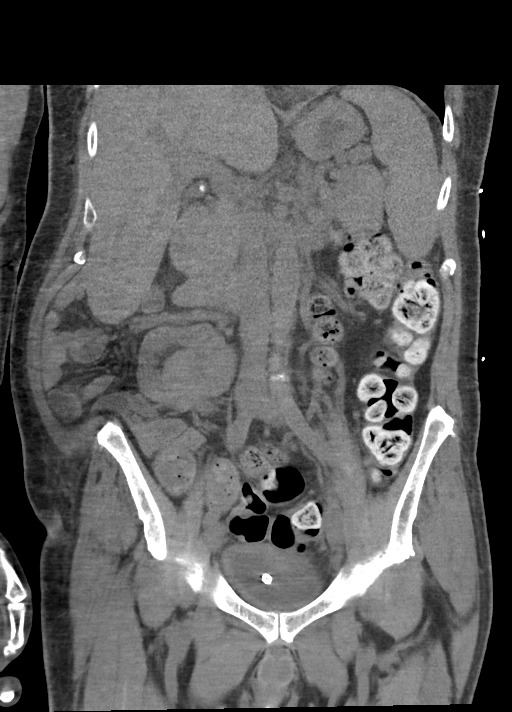
[im 48/87  soft-tissue]
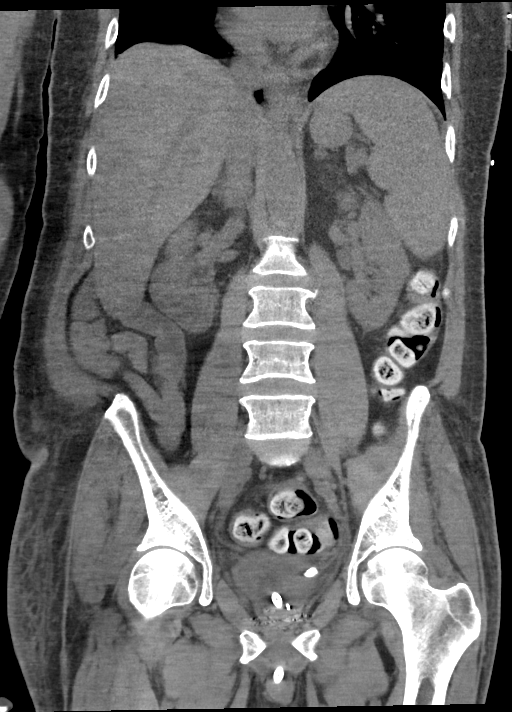

[16 of 46 positions shown; findings below may reference images not displayed]

FINDINGS: Lower chest: Bibasilar atelectatic changes are noted. No sizable
effusion is seen. Cardiac blood pool demonstrates decreased
attenuation consistent with underlying anemia.

Hepatobiliary: Liver is within normal limits. Gallbladder is well
distended. Multiple gallstones are seen without complicating
factors.

Pancreas: Unremarkable. No pancreatic ductal dilatation or
surrounding inflammatory changes.

Spleen: Normal in size without focal abnormality.

Adrenals/Urinary Tract: Adrenal glands are within normal limits.
Kidneys demonstrate no renal calculi or obstructive changes. Stable
hypodensity in the lower pole of the right kidney is noted
consistent with cyst. Chronic distal left ureteral stone is noted
which measures 9 mm. The bladder is partially distended with a Foley
catheter in place.

Stomach/Bowel: The appendix is not well visualized although no
inflammatory changes are seen to suggest appendicitis. No
obstructive or inflammatory changes of the colon or small bowel are
seen. The stomach is decompressed.

Vascular/Lymphatic: Aortic atherosclerosis. No enlarged abdominal or
pelvic lymph nodes.

Reproductive: Prostate shows multiple radiotherapy seeds.

Other: No abdominal wall hernia or abnormality. No abdominopelvic
ascites.

Musculoskeletal: Degenerative changes are noted without acute
abnormality.
IMPRESSION: Multiple gallstones without complicating factors.

Bibasilar atelectasis.

Chronic distal left ureteral stone.

Foley catheter in the bladder.

## 2020-12-14 IMAGING — CR DG CHEST 2V
1 series · 2 of 2 positions shown · non-contrast
Comparison: May 14, 2019

CLINICAL DATA: Cough.  Hypertension.

EXAM:
CHEST - 2 VIEW

[Series 1: dg chest 2 view · 0.14mm/px · 2 of 2 slices shown]
[im 1/2]
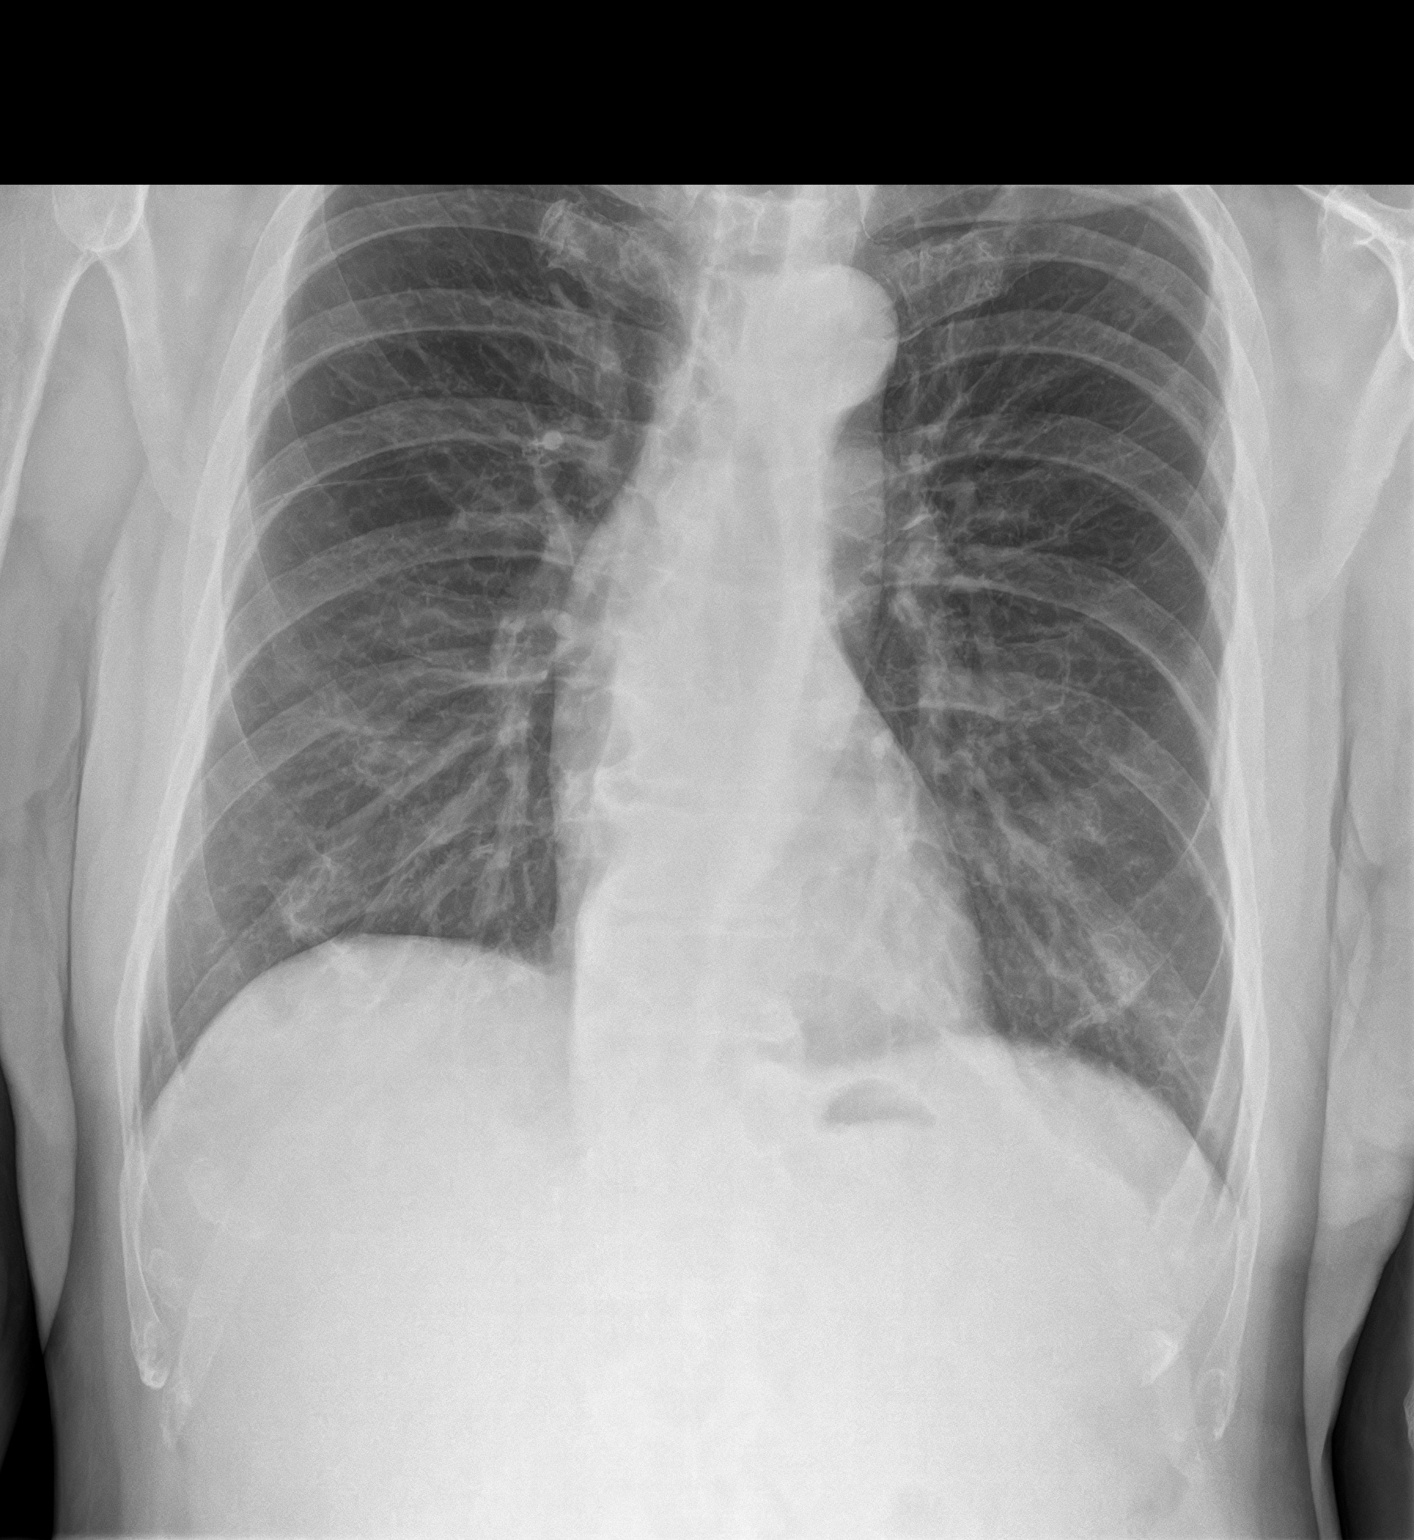
[im 2/2]
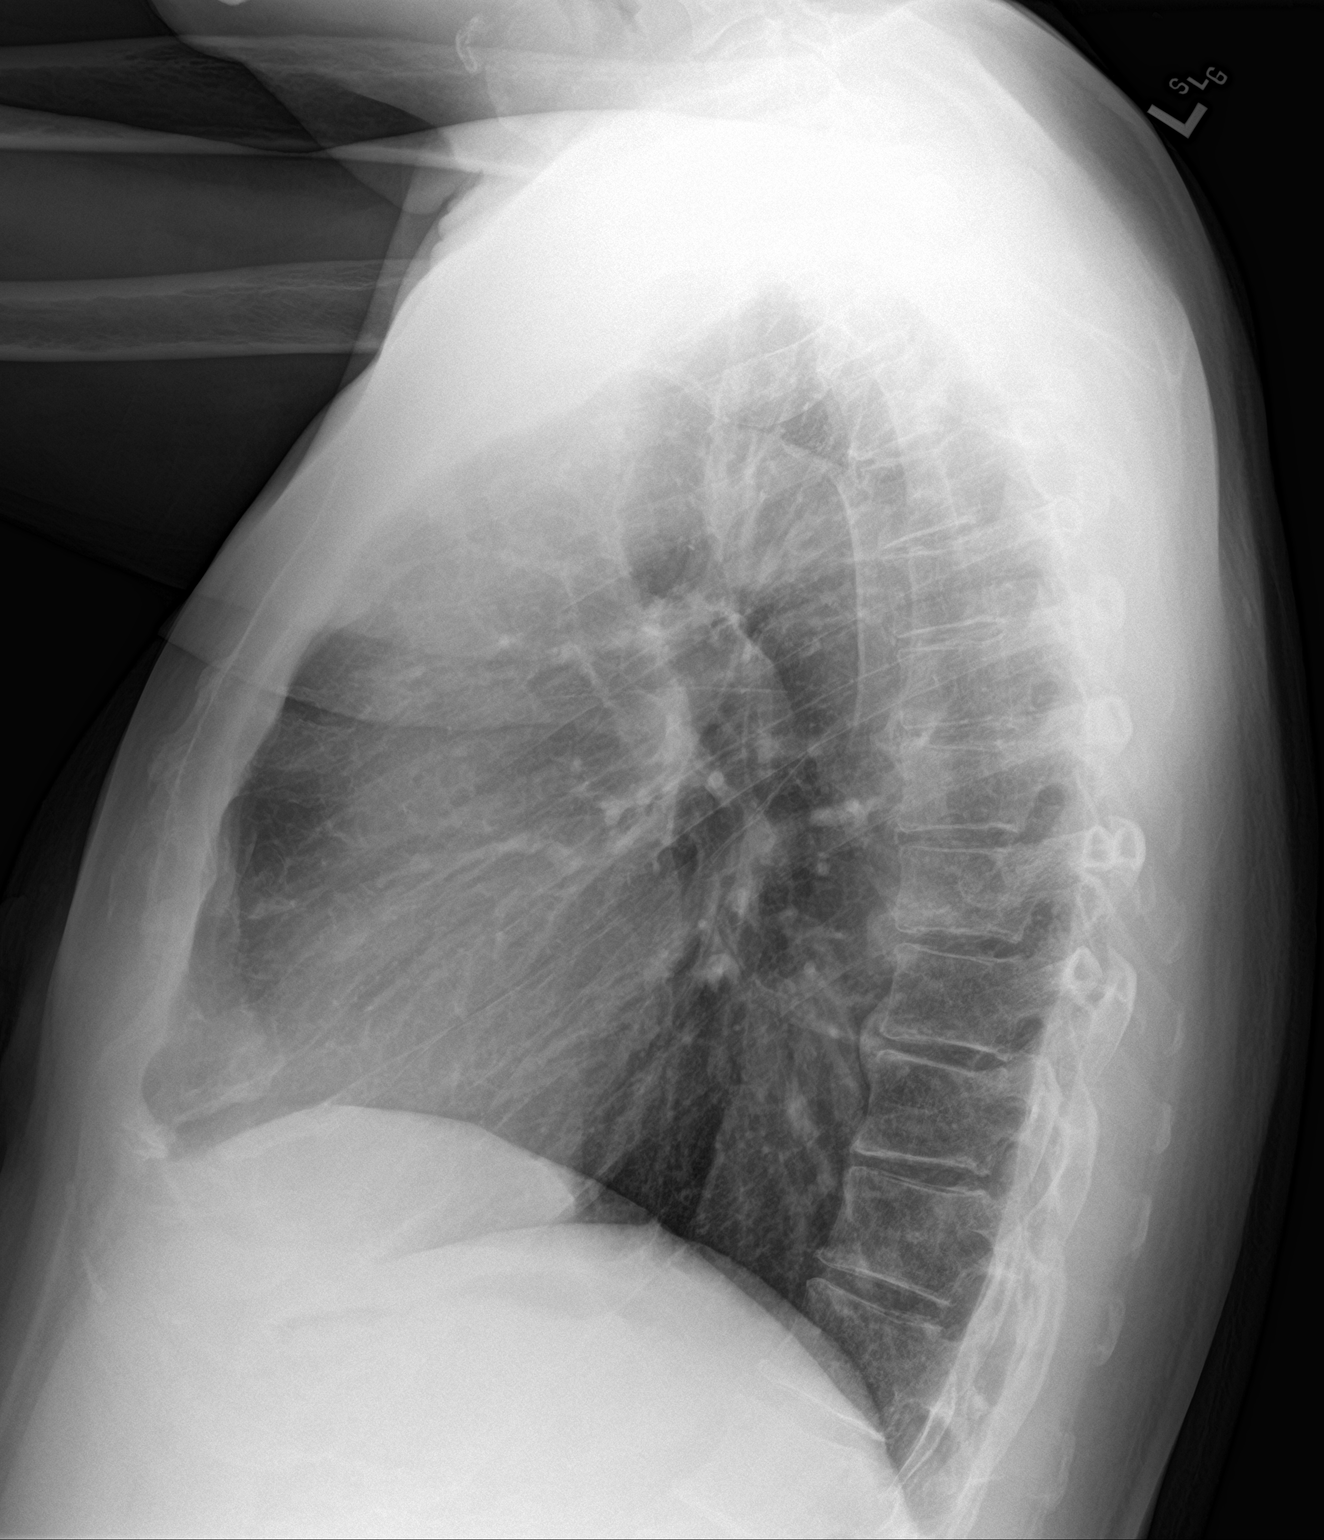

[2 of 2 positions shown; findings below may reference images not displayed]

FINDINGS: Lungs are clear. Heart size and pulmonary vascularity are normal. No
adenopathy. Aorta is mildly tortuous but stable. No bone lesions.
IMPRESSION: Lungs clear. Cardiac silhouette within normal limits. No adenopathy.

## 2021-10-23 ENCOUNTER — Inpatient Hospital Stay (HOSPITAL_COMMUNITY)
Admission: EM | Admit: 2021-10-23 | Discharge: 2022-02-01 | DRG: 683 | Disposition: A | Discharge status: Home / Self Care | Payer: Medicaid Other | Attending: Family Medicine | Admitting: Family Medicine

## 2021-10-23 ENCOUNTER — Encounter (HOSPITAL_COMMUNITY): Payer: Self-pay

## 2021-10-23 DIAGNOSIS — E870 Hyperosmolality and hypernatremia: Secondary | ICD-10-CM | POA: Diagnosis not present

## 2021-10-23 DIAGNOSIS — B9689 Other specified bacterial agents as the cause of diseases classified elsewhere: Secondary | ICD-10-CM | POA: Diagnosis present

## 2021-10-23 DIAGNOSIS — R440 Auditory hallucinations: Secondary | ICD-10-CM

## 2021-10-23 DIAGNOSIS — E1122 Type 2 diabetes mellitus with diabetic chronic kidney disease: Secondary | ICD-10-CM | POA: Diagnosis present

## 2021-10-23 DIAGNOSIS — Z1152 Encounter for screening for COVID-19: Secondary | ICD-10-CM

## 2021-10-23 DIAGNOSIS — N451 Epididymitis: Secondary | ICD-10-CM | POA: Diagnosis present

## 2021-10-23 DIAGNOSIS — I10 Essential (primary) hypertension: Secondary | ICD-10-CM | POA: Diagnosis present

## 2021-10-23 DIAGNOSIS — D696 Thrombocytopenia, unspecified: Secondary | ICD-10-CM | POA: Diagnosis present

## 2021-10-23 DIAGNOSIS — N401 Enlarged prostate with lower urinary tract symptoms: Secondary | ICD-10-CM | POA: Diagnosis present

## 2021-10-23 DIAGNOSIS — N400 Enlarged prostate without lower urinary tract symptoms: Secondary | ICD-10-CM | POA: Diagnosis present

## 2021-10-23 DIAGNOSIS — E785 Hyperlipidemia, unspecified: Secondary | ICD-10-CM | POA: Diagnosis present

## 2021-10-23 DIAGNOSIS — I129 Hypertensive chronic kidney disease with stage 1 through stage 4 chronic kidney disease, or unspecified chronic kidney disease: Secondary | ICD-10-CM | POA: Diagnosis present

## 2021-10-23 DIAGNOSIS — D631 Anemia in chronic kidney disease: Secondary | ICD-10-CM | POA: Diagnosis present

## 2021-10-23 DIAGNOSIS — I4510 Unspecified right bundle-branch block: Secondary | ICD-10-CM | POA: Diagnosis present

## 2021-10-23 DIAGNOSIS — T63451A Toxic effect of venom of hornets, accidental (unintentional), initial encounter: Secondary | ICD-10-CM

## 2021-10-23 DIAGNOSIS — N390 Urinary tract infection, site not specified: Secondary | ICD-10-CM | POA: Diagnosis not present

## 2021-10-23 DIAGNOSIS — Z9151 Personal history of suicidal behavior: Secondary | ICD-10-CM

## 2021-10-23 DIAGNOSIS — R319 Hematuria, unspecified: Secondary | ICD-10-CM | POA: Diagnosis not present

## 2021-10-23 DIAGNOSIS — R000 Tachycardia, unspecified: Secondary | ICD-10-CM | POA: Diagnosis not present

## 2021-10-23 DIAGNOSIS — Z886 Allergy status to analgesic agent status: Secondary | ICD-10-CM

## 2021-10-23 DIAGNOSIS — F0390 Unspecified dementia without behavioral disturbance: Secondary | ICD-10-CM | POA: Diagnosis present

## 2021-10-23 DIAGNOSIS — E1165 Type 2 diabetes mellitus with hyperglycemia: Secondary | ICD-10-CM | POA: Diagnosis present

## 2021-10-23 DIAGNOSIS — Z79899 Other long term (current) drug therapy: Secondary | ICD-10-CM

## 2021-10-23 DIAGNOSIS — F17210 Nicotine dependence, cigarettes, uncomplicated: Secondary | ICD-10-CM | POA: Diagnosis present

## 2021-10-23 DIAGNOSIS — N1831 Chronic kidney disease, stage 3a: Secondary | ICD-10-CM | POA: Diagnosis present

## 2021-10-23 DIAGNOSIS — Z91148 Patient's other noncompliance with medication regimen for other reason: Secondary | ICD-10-CM

## 2021-10-23 DIAGNOSIS — E871 Hypo-osmolality and hyponatremia: Secondary | ICD-10-CM | POA: Diagnosis present

## 2021-10-23 DIAGNOSIS — S40861A Insect bite (nonvenomous) of right upper arm, initial encounter: Secondary | ICD-10-CM | POA: Diagnosis present

## 2021-10-23 DIAGNOSIS — Z79891 Long term (current) use of opiate analgesic: Secondary | ICD-10-CM

## 2021-10-23 DIAGNOSIS — F200 Paranoid schizophrenia: Secondary | ICD-10-CM | POA: Diagnosis present

## 2021-10-23 DIAGNOSIS — L509 Urticaria, unspecified: Secondary | ICD-10-CM | POA: Diagnosis present

## 2021-10-23 DIAGNOSIS — F429 Obsessive-compulsive disorder, unspecified: Secondary | ICD-10-CM | POA: Diagnosis present

## 2021-10-23 DIAGNOSIS — E119 Type 2 diabetes mellitus without complications: Secondary | ICD-10-CM

## 2021-10-23 DIAGNOSIS — Z751 Person awaiting admission to adequate facility elsewhere: Secondary | ICD-10-CM

## 2021-10-23 DIAGNOSIS — S40862A Insect bite (nonvenomous) of left upper arm, initial encounter: Secondary | ICD-10-CM | POA: Diagnosis present

## 2021-10-23 DIAGNOSIS — Z8546 Personal history of malignant neoplasm of prostate: Secondary | ICD-10-CM

## 2021-10-23 DIAGNOSIS — R45851 Suicidal ideations: Secondary | ICD-10-CM

## 2021-10-23 DIAGNOSIS — R338 Other retention of urine: Secondary | ICD-10-CM | POA: Diagnosis present

## 2021-10-23 DIAGNOSIS — N179 Acute kidney failure, unspecified: Principal | ICD-10-CM | POA: Diagnosis present

## 2021-10-23 DIAGNOSIS — W57XXXA Bitten or stung by nonvenomous insect and other nonvenomous arthropods, initial encounter: Secondary | ICD-10-CM | POA: Diagnosis present

## 2021-10-23 HISTORY — DX: Malignant neoplasm of prostate: C61

## 2021-10-23 LAB — RAPID URINE DRUG SCREEN, HOSP PERFORMED
Amphetamines: NOT DETECTED
Barbiturates: NOT DETECTED
Benzodiazepines: POSITIVE — AB
Cocaine: NOT DETECTED
Opiates: NOT DETECTED
Tetrahydrocannabinol: NOT DETECTED

## 2021-10-23 LAB — CBC WITH DIFFERENTIAL/PLATELET
Abs Immature Granulocytes: 0.02 10*3/uL (ref 0.00–0.07)
Basophils Absolute: 0 10*3/uL (ref 0.0–0.1)
Basophils Relative: 0 %
Eosinophils Absolute: 0 10*3/uL (ref 0.0–0.5)
Eosinophils Relative: 0 %
HCT: 37.6 % — ABNORMAL LOW (ref 39.0–52.0)
Hemoglobin: 13.6 g/dL (ref 13.0–17.0)
Immature Granulocytes: 0 %
Lymphocytes Relative: 20 %
Lymphs Abs: 1.6 10*3/uL (ref 0.7–4.0)
MCH: 31.6 pg (ref 26.0–34.0)
MCHC: 36.2 g/dL — ABNORMAL HIGH (ref 30.0–36.0)
MCV: 87.4 fL (ref 80.0–100.0)
Monocytes Absolute: 0.5 10*3/uL (ref 0.1–1.0)
Monocytes Relative: 6 %
Neutro Abs: 6.1 10*3/uL (ref 1.7–7.7)
Neutrophils Relative %: 74 %
Platelets: 141 10*3/uL — ABNORMAL LOW (ref 150–400)
RBC: 4.3 MIL/uL (ref 4.22–5.81)
RDW: 14 % (ref 11.5–15.5)
WBC: 8.2 10*3/uL (ref 4.0–10.5)
nRBC: 0 % (ref 0.0–0.2)

## 2021-10-23 LAB — COMPREHENSIVE METABOLIC PANEL
ALT: 26 U/L (ref 0–44)
AST: 22 U/L (ref 15–41)
Albumin: 4.5 g/dL (ref 3.5–5.0)
Alkaline Phosphatase: 81 U/L (ref 38–126)
Anion gap: 9 (ref 5–15)
BUN: 28 mg/dL — ABNORMAL HIGH (ref 8–23)
CO2: 21 mmol/L — ABNORMAL LOW (ref 22–32)
Calcium: 9.3 mg/dL (ref 8.9–10.3)
Chloride: 113 mmol/L — ABNORMAL HIGH (ref 98–111)
Creatinine, Ser: 1.63 mg/dL — ABNORMAL HIGH (ref 0.61–1.24)
GFR, Estimated: 47 mL/min — ABNORMAL LOW (ref >60)
Glucose, Bld: 104 mg/dL — ABNORMAL HIGH (ref 70–99)
Potassium: 3.7 mmol/L (ref 3.5–5.1)
Sodium: 143 mmol/L (ref 135–145)
Total Bilirubin: 0.9 mg/dL (ref 0.3–1.2)
Total Protein: 7.7 g/dL (ref 6.5–8.1)

## 2021-10-23 LAB — RESP PANEL BY RT-PCR (FLU A&B, COVID) ARPGX2
Influenza A by PCR: NEGATIVE
Influenza B by PCR: NEGATIVE
SARS Coronavirus 2 by RT PCR: NEGATIVE

## 2021-10-23 LAB — ETHANOL: Alcohol, Ethyl (B): <10 mg/dL (ref <10)

## 2021-10-23 MED ORDER — LACTATED RINGERS IV BOLUS
1000.0000 mL | Freq: Once | INTRAVENOUS | Status: AC
  Administered 2021-10-23: 1000 mL via INTRAVENOUS

## 2021-10-23 MED ORDER — FAMOTIDINE 20 MG PO TABS
20.0000 mg | ORAL_TABLET | Freq: Once | ORAL | Status: AC
  Administered 2021-10-23: 20 mg via ORAL
  Filled 2021-10-23: qty 1

## 2021-10-23 MED ORDER — DEXAMETHASONE SODIUM PHOSPHATE 10 MG/ML IJ SOLN
10.0000 mg | Freq: Once | INTRAMUSCULAR | Status: AC
  Administered 2021-10-23: 10 mg via INTRAVENOUS
  Filled 2021-10-23: qty 1

## 2021-10-23 MED ORDER — DIPHENHYDRAMINE HCL 50 MG/ML IJ SOLN
25.0000 mg | Freq: Once | INTRAMUSCULAR | Status: AC
  Administered 2021-10-23: 25 mg via INTRAVENOUS
  Filled 2021-10-23: qty 1

## 2021-10-23 NOTE — ED Triage Notes (Signed)
Pt arrives via [place] voluntarily after running away from group home. Pt resides at new group home until 9/18 and had multiple elopements from there. Pt reports thoughts of self harm with intent to hold his breath per [employer]. Pt appears anxious, unable to sit still in triage. Endorses SI, no HI or AVH
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
## 2021-10-23 NOTE — ED Notes (Signed)
Pt caregivers at bedside. Caregivers report pt family is on the way. Pt is calm and cooperative with nursing care.

## 2021-10-23 NOTE — ED Provider Notes (Signed)
[facility] DEPT Provider Note   CSN: [id number] Arrival date & time: 10/23/21  1751     History  Chief Complaint  Patient presents with   Psychiatric Evaluation   Anxiety    Peter Parsons is a 61 y.o. male with history of paranoid schizophrenia, dementia, type 2 diabetes who presents to the emergency department voluntarily for evaluation of suicidal ideation.  Patient states that he was staying in a group home and has been kicked out of the group home after multiple elopements.  Patient states that he normally is on respite all twice a month, however he has not gotten his last dose in over a month.  He endorses increasing symptoms of paranoia and feeling that everyone is "out to get him".  Also endorsing suicidal ideation without a plan.  He states that he is hearing voices telling him to hold his breath until he dies. Denies homicidal ideation or visual hallucinations.  Patient also states that earlier today he was "hiding out" in some trees and was stung multiple times by hornets.  He has large areas of redness that he describes as burning and uncomfortable.  He denies chest pain, shortness of breath, abdominal pain, nausea, vomiting and diarrhea.  He does state that he has not been drinking or eating very much in recent days.   Anxiety Pertinent negatives include no chest pain, no abdominal pain and no headaches.       Home Medications Prior to Admission medications   Medication Sig Start Date End Date Taking? Authorizing Provider  ARIPIPRAZOLE IM Inject into the muscle every 30 (thirty) days.   Yes [provider]  atenolol (TENORMIN) 25 MG tablet Take 25 mg by mouth at bedtime.   Yes [provider]  cloZAPine (CLOZARIL) 100 MG tablet Take 300 mg by mouth at bedtime.   Yes [provider]  ibuprofen (ADVIL) 200 MG tablet Take 1 tablet (200 mg total) by mouth every 6 (six) hours as needed for fever, headache or moderate pain.  07/12/19  Yes Bonnell Public Tublu, MD  LORazepam (ATIVAN) 0.5 MG tablet Take 1.5 mg by mouth 2 (two) times daily.   Yes [provider]  LORazepam (ATIVAN) 2 MG tablet Take 2 mg by mouth daily.   Yes [provider]  nystatin cream (MYCOSTATIN) Apply topically 2 (two) times daily. 07/12/19  Yes Bonnell Public Tublu, MD  polyethylene glycol (MIRALAX / GLYCOLAX) 17 g packet Take 17 g by mouth 2 (two) times daily. 07/12/19  Yes Vashti Hey, MD  pravastatin (PRAVACHOL) 40 MG tablet Take 40 mg by mouth daily.   Yes [provider]  senna (SENOKOT) 8.6 MG TABS tablet Take 4 tablets by mouth 2 (two) times daily.   Yes [provider]  senna-docusate (SENOKOT-S) 8.6-50 MG tablet Take 2 tablets by mouth 2 (two) times daily. 07/12/19  Yes Vashti Hey, MD  sertraline (ZOLOFT) 100 MG tablet Take 200 mg by mouth daily.   Yes [provider]  sulfamethoxazole-trimethoprim (BACTRIM DS) 800-160 MG tablet Take 1 tablet by mouth daily. 10/23/21 11/01/21 Yes [provider]  tamsulosin (FLOMAX) 0.4 MG CAPS capsule Take 0.4 mg by mouth daily. 08/12/18  Yes [provider]  chlorhexidine (PERIDEX) 0.12 % solution Use as directed 15 mLs in the mouth or throat 2 (two) times daily. Patient not taking: Reported on 10/23/2021 07/12/19   Vashti Hey, MD  clonazePAM (KLONOPIN) 0.5 MG tablet Take 0.5 tablets (0.25 mg total)  by mouth 3 (three) times daily as needed (anxiety). Patient not taking: Reported on 10/23/2021 07/12/19   Vashti Hey, MD  escitalopram (LEXAPRO) 10 MG tablet Take 1 tablet (10 mg total) by mouth daily. Patient not taking: Reported on 10/23/2021 07/12/19   Vashti Hey, MD  traZODone (DESYREL) 50 MG tablet Take 1 tablet (50 mg total) by mouth at bedtime. Patient not taking: Reported on 10/23/2021 07/12/19   Vashti Hey, MD  ziprasidone (GEODON) 40 MG capsule Take 1  capsule (40 mg total) by mouth daily at 8 pm. Patient not taking: Reported on 10/23/2021 07/12/19   Vashti Hey, MD  ziprasidone (GEODON) 60 MG capsule Take 1 capsule (60 mg total) by mouth daily at 8 pm. Patient not taking: Reported on 10/23/2021 07/12/19   Vashti Hey, MD      Allergies    Acetaminophen    Review of Systems   Review of Systems  Constitutional:  Negative for fever.  Cardiovascular:  Negative for chest pain.  Gastrointestinal:  Negative for abdominal pain, diarrhea, nausea and vomiting.  Skin:  Positive for rash.  Neurological:  Negative for headaches.    Physical Exam Updated Vital Signs BP (!) 151/100 (BP Location: Right Arm)   Pulse (!) 109   Temp 100.2 F (37.9 C) (Oral)   Resp 18   Ht '5\' 10"'$  (1.778 m)   Wt 89.8 kg   SpO2 97%   BMI 28.41 kg/m  Physical Exam Vitals and nursing note reviewed.  Constitutional:      General: He is not in acute distress.    Appearance: He is not ill-appearing.  HENT:     Head: Atraumatic.  Eyes:     Conjunctiva/sclera: Conjunctivae normal.  Cardiovascular:     Rate and Rhythm: Normal rate and regular rhythm.     Pulses: Normal pulses.     Heart sounds: No murmur heard. Pulmonary:     Effort: Pulmonary effort is normal. No respiratory distress.     Breath sounds: Normal breath sounds.  Abdominal:     General: Abdomen is flat. There is no distension.     Palpations: Abdomen is soft.     Tenderness: There is no abdominal tenderness.  Musculoskeletal:        General: Normal range of motion.     Cervical back: Normal range of motion.  Skin:    General: Skin is warm and dry.     Capillary Refill: Capillary refill takes less than 2 seconds.     Comments: Multiple large erythematous, urticarial rashes on the bilateral forearms.  Tender to palpation.  Neurological:     General: No focal deficit present.     Mental Status: He is alert.     ED Results / Procedures / Treatments   Labs (all  labs ordered are listed, but only abnormal results are displayed) Labs Reviewed  COMPREHENSIVE METABOLIC PANEL - Abnormal; Notable for the following components:      Result Value   Chloride 113 (*)    CO2 21 (*)    Glucose, Bld 104 (*)    BUN 28 (*)    Creatinine, Ser 1.63 (*)    GFR, Estimated 47 (*)    All other components within normal limits  RAPID URINE DRUG SCREEN, HOSP PERFORMED - Abnormal; Notable for the following components:   Benzodiazepines POSITIVE (*)    All other components within normal limits  CBC WITH DIFFERENTIAL/PLATELET - Abnormal; Notable for the following components:  HCT 37.6 (*)    MCHC 36.2 (*)    Platelets 141 (*)    All other components within normal limits  RESP PANEL BY RT-PCR (FLU A&B, COVID) ARPGX2  ETHANOL    EKG None  Radiology No results found.  Procedures Procedures    Medications Ordered in ED Medications  lactated ringers bolus 1,000 mL (1,000 mLs Intravenous New Bag/Given 10/23/21 2145)  diphenhydrAMINE (BENADRYL) injection 25 mg (25 mg Intravenous Given 10/23/21 2143)  dexamethasone (DECADRON) injection 10 mg (10 mg Intravenous Given 10/23/21 2143)  famotidine (PEPCID) tablet 20 mg (20 mg Oral Given 10/23/21 2146)    ED Course/ Medical Decision Making/ A&P                           Medical Decision Making Risk Prescription drug management.   Social determinants of health:  Social History   Socioeconomic History   Marital status: Single    Spouse name: Not on file   Number of children: Not on file   Years of education: Not on file   Highest education level: Not on file  Occupational History   Not on file  Tobacco Use   Smoking status: Former   Smokeless tobacco: Never  Substance and Sexual Activity   Alcohol use: Not Currently   Drug use: Not Currently   Sexual activity: Not on file  Other Topics Concern   Not on file  Social History Narrative   Not on file   Social Determinants of Health   Financial  Resource Strain: Not on file  Food Insecurity: Not on file  Transportation Needs: Not on file  Physical Activity: Not on file  Stress: Not on file  Social Connections: Not on file  Intimate Partner Violence: Not on file     Initial impression:  This patient presents to the ED for concern of suicidal ideation with auditory hallucinations and secondary concern of hornet stings, this involves an extensive number of treatment options, and is a complaint that carries with it a high risk of complications and morbidity.    Comorbidities affecting care:  HPI  Additional history obtained: Legal guardian at bedside  Lab Tests  I Ordered, reviewed, and interpreted labs and EKG.  The pertinent results include:  CMP with creatinine 1.63, elevated from normal 2 years ago CBC without leukocytosis Positive for benzodiazepines although he has prescription for Ativan Negative COVID   Medicines ordered and prescription drug management:  I ordered medication including: 1 L LR bolus Decadron 10 mg IV Benadryl 20 mg IV Pepcid 20 mg I have reviewed the patients home medicines and have made adjustments as needed   ED Course/Re-evaluation: Present is in no acute distress and is nontoxic.  Vitals notable for mild tachycardia to 109.  He is afebrile although temperature of 100.2 F.  Lungs CTA bilaterally.  He is pleasant and speaking in full complete sentences.  Bilateral upper extremities have numerous large erythematous urticarial rashes consistent with hornet stings.  Given number of stings and with AKI, will give fluid bolus along with steroids, Pepcid and Benadryl. Patient care handed off to [employer] at shift change.  She will reassess patient after treatment to determine medical clearance and order TTS consult   Final Clinical Impression(s) / ED Diagnoses Final diagnoses:  Suicidal ideation  Auditory hallucination  Hornet sting, accidental or unintentional, initial encounter    Rx  / DC Orders ED Discharge Orders  None         Rodena Piety 10/23/21 2234    Godfrey Pick, MD 10/24/21 0111

## 2021-10-23 NOTE — ED Provider Triage Note (Signed)
Emergency Medicine Provider Triage Evaluation Note  Peter Parsons , a 61 y.o. male  was evaluated in triage.  Pt complains of running away from his group home this morning.  He reported running weight prior to receiving his medications, has not taken his medications yet.  He reports being anxious because he ran away, knows this was wrong, and believes that he has to pay for decision.  He is accompanied by police.  Patient does have suicidal ideations at this time, does not have an active plan.  Denies homicidal ideations  Review of Systems  Positive: Anxiety, suicidal ideations Negative: Chest pain, shortness of breath, homicidal ideations  Physical Exam  BP (!) 151/100 (BP Location: Right Arm)   Pulse (!) 109   Temp 100.2 F (37.9 C) (Oral)   Resp 18   Ht '5\' 10"'$  (1.778 m)   Wt 89.8 kg   SpO2 97%   BMI 28.41 kg/m  Gen:   Awake, no distress   Resp:  Normal effort  MSK:   Moves extremities without difficulty  Other:  Patient has multiple bug bites to bilateral hands  Medical Decision Making  Medically screening exam initiated at 6:40 PM.  Appropriate orders placed.  Peter Parsons was informed that the remainder of the evaluation will be completed by another provider, this initial triage assessment does not replace that evaluation, and the importance of remaining in the ED until their evaluation is complete.  Medical clearance, patient has eloped from his group home.  Has suicidal ideations.   Peter Parsons, [place] 10/23/21 1842

## 2021-10-24 ENCOUNTER — Other Ambulatory Visit: Payer: Self-pay

## 2021-10-24 ENCOUNTER — Encounter (HOSPITAL_COMMUNITY): Payer: Self-pay

## 2021-10-24 DIAGNOSIS — F200 Paranoid schizophrenia: Secondary | ICD-10-CM

## 2021-10-24 LAB — I-STAT CHEM 8, ED
BUN: 27 mg/dL — ABNORMAL HIGH (ref 8–23)
Calcium, Ion: 1.2 mmol/L (ref 1.15–1.40)
Chloride: 108 mmol/L (ref 98–111)
Creatinine, Ser: 1.8 mg/dL — ABNORMAL HIGH (ref 0.61–1.24)
Glucose, Bld: 115 mg/dL — ABNORMAL HIGH (ref 70–99)
HCT: 36 % — ABNORMAL LOW (ref 39.0–52.0)
Hemoglobin: 12.2 g/dL — ABNORMAL LOW (ref 13.0–17.0)
Potassium: 3.9 mmol/L (ref 3.5–5.1)
Sodium: 145 mmol/L (ref 135–145)
TCO2: 24 mmol/L (ref 22–32)

## 2021-10-24 MED ORDER — LORAZEPAM 1 MG PO TABS
2.0000 mg | ORAL_TABLET | Freq: Once | ORAL | Status: AC
  Administered 2021-10-24: 2 mg via ORAL
  Filled 2021-10-24: qty 2

## 2021-10-24 MED ORDER — LORAZEPAM 1 MG PO TABS
2.0000 mg | ORAL_TABLET | Freq: Two times a day (BID) | ORAL | Status: DC
  Administered 2021-10-24 – 2022-02-01 (×195): 2 mg via ORAL
  Filled 2021-10-24 (×200): qty 2

## 2021-10-24 MED ORDER — MELATONIN 3 MG PO TABS
3.0000 mg | ORAL_TABLET | Freq: Once | ORAL | Status: AC
  Administered 2021-10-24: 3 mg via ORAL
  Filled 2021-10-24: qty 1

## 2021-10-24 MED ORDER — LORAZEPAM 1 MG PO TABS
1.0000 mg | ORAL_TABLET | Freq: Once | ORAL | Status: AC
  Administered 2021-10-24: 1 mg via ORAL
  Filled 2021-10-24: qty 1

## 2021-10-24 MED ORDER — CLOZAPINE 100 MG PO TABS
200.0000 mg | ORAL_TABLET | Freq: Every day | ORAL | Status: AC
  Administered 2021-10-25: 200 mg via ORAL
  Filled 2021-10-24: qty 2

## 2021-10-24 MED ORDER — CLOZAPINE 100 MG PO TABS
100.0000 mg | ORAL_TABLET | Freq: Every day | ORAL | Status: AC
  Administered 2021-10-24: 100 mg via ORAL
  Filled 2021-10-24 (×2): qty 1

## 2021-10-24 MED ORDER — CLOZAPINE 100 MG PO TABS
300.0000 mg | ORAL_TABLET | Freq: Every day | ORAL | Status: DC
  Administered 2021-10-27 – 2022-01-31 (×98): 300 mg via ORAL
  Filled 2021-10-24 (×106): qty 3

## 2021-10-24 MED ORDER — SERTRALINE HCL 100 MG PO TABS
200.0000 mg | ORAL_TABLET | Freq: Every day | ORAL | Status: DC
  Administered 2021-10-24 – 2022-02-01 (×101): 200 mg via ORAL
  Filled 2021-10-24 (×7): qty 4
  Filled 2021-10-24: qty 2
  Filled 2021-10-24 (×35): qty 4
  Filled 2021-10-24: qty 2
  Filled 2021-10-24 (×19): qty 4
  Filled 2021-10-24: qty 2
  Filled 2021-10-24 (×2): qty 4
  Filled 2021-10-24 (×2): qty 2
  Filled 2021-10-24 (×4): qty 4
  Filled 2021-10-24: qty 2
  Filled 2021-10-24 (×4): qty 4
  Filled 2021-10-24: qty 2
  Filled 2021-10-24 (×19): qty 4
  Filled 2021-10-24: qty 2
  Filled 2021-10-24 (×4): qty 4

## 2021-10-24 NOTE — ED Notes (Signed)
Pt restless d/t anxiety. Pt continuously getting in and out of bed. Pt states "I can't sleep. Can I have a pill that will help me sleep?" RN aware. This Probation officer provided pt with coloring sheets and crayons

## 2021-10-24 NOTE — Progress Notes (Signed)
Pt has been psych cleared per Lindon Romp, NP. TOC will assist with any discharge needs.  Benjaman Kindler, MSW, [facility] 10/24/2021 4:33 PM

## 2021-10-24 NOTE — BH Assessment (Signed)
Clinician messaged Alcario Drought. Doy Mince, RN: "Hey. It's Trey with TTS. Is the pt able to engage in the assessment, if so the pt will need to be placed in a private room. Is the pt under IVC? Also is the pt medically cleared?"  Clinician awaiting response.    Vertell Novak, [place], [facility], [facility] Triage Specialist [phone]

## 2021-10-24 NOTE — ED Notes (Signed)
I rounded on this patient as of 10/24/2021 and patient was not IVCed.

## 2021-10-24 NOTE — Progress Notes (Signed)
CSW spoke to [place], [place] states that the Pt was only with her for respite and the legal guardian  told her that we, the ED will find the Pt placement. CSW will continue to try to contact Pt.

## 2021-10-24 NOTE — ED Notes (Signed)
Pt continues to be restless. Noted pt unable to be still for even a min and is very fidgety. Requires multiple verbal cues and redirection. Pt will attempt to walk away from his bed when assuming this Probation officer Freight forwarder) is not watching. Pt states he has "anxiety" and wants "a pill to help him sleep". RN aware

## 2021-10-24 NOTE — ED Notes (Signed)
Pt not staying in stretcher. Repeatedly getting up as soon as he thinks no one is paying attention to him.

## 2021-10-24 NOTE — ED Notes (Signed)
Pt given dinner tray.
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
## 2021-10-24 NOTE — Progress Notes (Signed)
[name] from APS called back, [name] reports that Peter Parsons was a short term placement. Also reports that the Pt kept leaving. The Legal Guardian is asking for a new FL2 for this Pt has the APS reports Pt has been a complex placement. CSW explained to the APS worker that [name] will reach out tomorrow to help figure out placement plans for this Pt. APS was under the impression that the Ed would help with memory care for this Pt. TOC will continue to follow.

## 2021-10-24 NOTE — Progress Notes (Signed)
Transition of Care [facility]) - Emergency Department Mini Assessment   Patient Details  Name: Peter Parsons MRN: [id number] Date of Birth: 1961-01-15  Transition of [facility]) CM/SW Contact:    Rodney Booze, LCSW Phone Number: 10/24/2021, 7:06 PM   Clinical Narrative:  Pt is needed to get into a facility, CSW spoke to APS worker [name] and he stated that the Pt is not being able to stay in the respite home as that was short term. The APS worker said they were working with [facility] however there is no new updates for the Pt and where he can go. APS thought the hospital can help with memory care for the Pt. The APS worker is asking for a FL2 and recommendations for the PT   ED Mini Assessment: What brought you to the Emergency Department? : (P) Pt was brought in due to mental health concerns  Barriers to Discharge: (P) Other (must enter comment) (Needs long term ALF Memory care per legal guradian)        Interventions which prevented an admission or readmission: (P) Other (must enter comment) (Pt is Psych cleared and is very nice, however Pt has no where to go)    Patient Contact and Communications     Spoke with: (P) [place] legal guardian Contact Date: (P) 10/24/21,   Contact time: (P) [id number]      Patient states their goals for this hospitalization and ongoing recovery are:: (P) Pt needs to get into a facility CMS Medicare.gov Compare Post Acute Care list provided to:: (P) Patient Choice offered to / list presented to : (P) Patient  Admission diagnosis:  Vol Commitment Patient Active Problem List   Diagnosis Date Noted   Enterococcal bacteremia 05/15/2019   Hypertension 05/15/2019   Type 2 diabetes mellitus ([place]) 05/15/2019   Dyslipidemia 05/15/2019   BPH (benign prostatic hyperplasia) 05/15/2019   Normocytic anemia 05/15/2019   Thrombocytopenia ([place]) 05/15/2019   Acute urinary retention 05/15/2019   Urethral trauma 05/15/2019   UTI (urinary tract  infection) 05/15/2019   Pressure injury of skin 84/69/6295   Acute metabolic encephalopathy 28/41/3244   Protein-calorie malnutrition, severe 03/29/2019   Weakness 03/27/2019   Dementia ([place]) 03/19/2019   Altered mental status    Paranoid schizophrenia ([place])    PCP:  System, Provider Not In Pharmacy:   [place], [place] [address] Phone: [phone] Fax: [phone]

## 2021-10-24 NOTE — ED Notes (Signed)
Pt given lunch tray.

## 2021-10-24 NOTE — Social Work (Signed)
CSW called APS worker [name] [phone]. LEFT VM N/A. TOC will continue to update as updates come.

## 2021-10-24 NOTE — ED Notes (Addendum)
Pt is still restless and continues to get in/out of bed. Able to redirect pt and pt is cooperative.
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
## 2021-10-24 NOTE — ED Notes (Signed)
Pt requires multiple verbal cues to redirect. Pt continuously trying to get and walk away from [place] E area, states "I got to get out of here".

## 2021-10-24 NOTE — ED Notes (Signed)
Pt given ativan by RN a while ago. Pt restless and very fidgety. Pt not responding to verbal cues. Pt informed he is in a hospital and that it isn't appropriate to be ambulating away from his designated area. Pt gave no response to show if he understood or not.

## 2021-10-24 NOTE — ED Provider Notes (Signed)
Emergency Medicine Observation Re-evaluation Note  Ciro Tashiro is a 61 y.o. male, seen on rounds today.  Pt initially presented to the ED for complaints of Psychiatric Evaluation and Anxiety Currently, the patient is waiting for placement.  Pt denies any complaints.    Physical Exam  BP (!) 161/88 (BP Location: Right Arm)   Pulse 92   Temp 98.2 F (36.8 C) (Oral)   Resp 18   Ht '5\' 10"'$  (1.778 m)   Wt 89.8 kg   SpO2 97%   BMI 28.41 kg/m  Physical Exam General: wdwn 61 yo  Cardiac:  Lungs:  Psych: Pt pleasant, cooperative   ED Course / MDM  EKG:   I have reviewed the labs performed to date as well as medications administered while in observation.  Recent changes in the last 24 hours include none  Plan  Current plan is for pt to have group home placement     Sidney Ace 10/24/21 5427    Carmin Muskrat, MD 10/24/21 1609

## 2021-10-25 NOTE — Progress Notes (Signed)
Chaplain Rob Bunting was contacted by the Pt. The CSW spoke to the chaplain about this Pt and her concerns. The chaplin will follow up with the Pt tomorrow.  TOC will continue to update as updates come.

## 2021-10-25 NOTE — ED Notes (Signed)
Pt is currently laying down.

## 2021-10-25 NOTE — ED Notes (Signed)
Pt is currently sitting and drawing.

## 2021-10-25 NOTE — Progress Notes (Addendum)
This CSW attempted to contact Peter Parsons, legal guardian. Left a HIPAA Compliant voicemail. TOC following.   This CSW has contacted [facility] to inquire about bed availability to submit a referral.

## 2021-10-26 LAB — BASIC METABOLIC PANEL
Anion gap: 8 (ref 5–15)
BUN: 31 mg/dL — ABNORMAL HIGH (ref 8–23)
CO2: 25 mmol/L (ref 22–32)
Calcium: 9.3 mg/dL (ref 8.9–10.3)
Chloride: 112 mmol/L — ABNORMAL HIGH (ref 98–111)
Creatinine, Ser: 1.58 mg/dL — ABNORMAL HIGH (ref 0.61–1.24)
GFR, Estimated: 49 mL/min — ABNORMAL LOW (ref >60)
Glucose, Bld: 156 mg/dL — ABNORMAL HIGH (ref 70–99)
Potassium: 3.8 mmol/L (ref 3.5–5.1)
Sodium: 145 mmol/L (ref 135–145)

## 2021-10-26 MED ORDER — LORAZEPAM 1 MG PO TABS
1.0000 mg | ORAL_TABLET | Freq: Once | ORAL | Status: AC
  Administered 2021-10-26: 1 mg via ORAL
  Filled 2021-10-26: qty 1

## 2021-10-26 NOTE — ED Provider Notes (Signed)
Emergency Medicine Observation Re-evaluation Note  Peter Parsons is a 61 y.o. male, seen on rounds today.  Pt initially presented to the ED for complaints of Psychiatric Evaluation and Anxiety Currently, the patient is resting quietly.  Physical Exam  BP 113/69 (BP Location: Right Arm)   Pulse 76   Temp 98.3 F (36.8 C) (Oral)   Resp 16   Ht '5\' 10"'$  (1.778 m)   Wt 89.8 kg   SpO2 95%   BMI 28.41 kg/m  Physical Exam General: No acute distress Cardiac: Well-perfused Lungs: Nonlabored Psych: Cooperative  ED Course / MDM  EKG:EKG Interpretation  Date/Time:  Wednesday October 24 2021 05:46:01 EDT Ventricular Rate:  74 PR Interval:  136 QRS Duration: 144 QT Interval:  408 QTC Calculation: 453 R Axis:   -75 Text Interpretation: Sinus rhythm RBBB and LAFB When compared with ECG of 05/14/2019, Right bundle branch block is now present Confirmed by Delora Fuel ([id number]) on 10/25/2021 12:29:06 AM  I have reviewed the labs performed to date as well as medications administered while in observation.  Recent changes in the last 24 hours include psychiatrically cleared.  Plan  Current plan is for social work looking for placement.  Some labs that showed elevated creatinine.  I have ordered repeat BMP.     Hayden Rasmussen, MD 10/26/21 1247

## 2021-10-26 NOTE — NC FL2 (Signed)
[place] LEVEL OF CARE SCREENING TOOL     IDENTIFICATION  Patient Name: Peter Parsons Birthdate: 1960-10-11 Sex: male Admission Date (Current Location): 10/23/2021  [place] and [place] Number:  Peter Parsons [id number] [place] and Address:  [facility],  [place], [place]      Provider Number: [id number]  Attending Physician Name and Address:  Default, Provider, MD  Relative Name and Phone Number:  Legal Guardian [phone]    Current Level of Care: Hospital Recommended Level of Care: [place], Other (Comment) Prior Approval Number:    Date Approved/Denied:   PASRR Number:    Discharge Plan: (S) Other (Comment) (Group home ALF [facility] [place])    Current Diagnoses: Patient Active Problem List   Diagnosis Date Noted   Enterococcal bacteremia 05/15/2019   Hypertension 05/15/2019   Type 2 diabetes mellitus ([place]) 05/15/2019   Dyslipidemia 05/15/2019   BPH (benign prostatic hyperplasia) 05/15/2019   Normocytic anemia 05/15/2019   Thrombocytopenia ([place]) 05/15/2019   Acute urinary retention 05/15/2019   Urethral trauma 05/15/2019   UTI (urinary tract infection) 05/15/2019   Pressure injury of skin 50/27/7412   Acute metabolic encephalopathy 87/86/7672   Protein-calorie malnutrition, severe 03/29/2019   Weakness 03/27/2019   Dementia ([place]) 03/19/2019   Altered mental status    Paranoid schizophrenia ([place])     Orientation RESPIRATION BLADDER Height & Weight     Self, Time, Situation, Place  Normal Continent Weight: 198 lb (89.8 kg) Height:  '5\' 10"'$  (177.8 cm)  BEHAVIORAL SYMPTOMS/MOOD NEUROLOGICAL BOWEL NUTRITION STATUS      Continent Diet (Regular)  AMBULATORY STATUS COMMUNICATION OF NEEDS Skin   Independent Verbally Normal                       Personal Care Assistance Level of Assistance  Bathing, Feeding, Dressing Bathing Assistance: Independent Feeding assistance: Independent Dressing Assistance:  Independent     Functional Limitations Info  Sight, Hearing, Speech Sight Info: Adequate Hearing Info: Adequate Speech Info: Adequate    SPECIAL CARE FACTORS FREQUENCY                       Contractures Contractures Info: Not present    Additional Factors Info  Code Status Code Status Info: Full Allergies Info: Acetaminophen Psychotropic Info: Ativan Zoloft         Current Medications (10/26/2021):  This is the current hospital active medication list Current Facility-Administered Medications  Medication Dose Route Frequency Provider Last Rate Last Admin   cloZAPine (CLOZARIL) tablet 300 mg  300 mg Oral QHS Lindon Romp A, NP       LORazepam (ATIVAN) tablet 2 mg  2 mg Oral BID Lindon Romp A, NP   2 mg at 10/26/21 1029   sertraline (ZOLOFT) tablet 200 mg  200 mg Oral Daily Lindon Romp A, NP   200 mg at 10/26/21 1029   Current Outpatient Medications  Medication Sig Dispense Refill   ARIPIPRAZOLE IM Inject into the muscle every 30 (thirty) days.     atenolol (TENORMIN) 25 MG tablet Take 25 mg by mouth at bedtime.     cloZAPine (CLOZARIL) 100 MG tablet Take 300 mg by mouth at bedtime.     ibuprofen (ADVIL) 200 MG tablet Take 1 tablet (200 mg total) by mouth every 6 (six) hours as needed for fever, headache or moderate pain. 30 tablet 0   LORazepam (ATIVAN) 0.5 MG tablet Take 1.5 mg by  mouth 2 (two) times daily.     LORazepam (ATIVAN) 2 MG tablet Take 2 mg by mouth daily.     nystatin cream (MYCOSTATIN) Apply topically 2 (two) times daily. 30 g 0   polyethylene glycol (MIRALAX / GLYCOLAX) 17 g packet Take 17 g by mouth 2 (two) times daily. 14 each 0   pravastatin (PRAVACHOL) 40 MG tablet Take 40 mg by mouth daily.     senna (SENOKOT) 8.6 MG TABS tablet Take 4 tablets by mouth 2 (two) times daily.     senna-docusate (SENOKOT-S) 8.6-50 MG tablet Take 2 tablets by mouth 2 (two) times daily. 60 tablet 0   sertraline (ZOLOFT) 100 MG tablet Take 200 mg by mouth daily.      sulfamethoxazole-trimethoprim (BACTRIM DS) 800-160 MG tablet Take 1 tablet by mouth daily.     tamsulosin (FLOMAX) 0.4 MG CAPS capsule Take 0.4 mg by mouth daily.     chlorhexidine (PERIDEX) 0.12 % solution Use as directed 15 mLs in the mouth or throat 2 (two) times daily. (Patient not taking: Reported on 10/23/2021) 120 mL 0   clonazePAM (KLONOPIN) 0.5 MG tablet Take 0.5 tablets (0.25 mg total) by mouth 3 (three) times daily as needed (anxiety). (Patient not taking: Reported on 10/23/2021) 30 tablet 0   escitalopram (LEXAPRO) 10 MG tablet Take 1 tablet (10 mg total) by mouth daily. (Patient not taking: Reported on 10/23/2021) 30 tablet 0   traZODone (DESYREL) 50 MG tablet Take 1 tablet (50 mg total) by mouth at bedtime. (Patient not taking: Reported on 10/23/2021) 30 tablet 0   ziprasidone (GEODON) 40 MG capsule Take 1 capsule (40 mg total) by mouth daily at 8 pm. (Patient not taking: Reported on 10/23/2021) 30 capsule 0   ziprasidone (GEODON) 60 MG capsule Take 1 capsule (60 mg total) by mouth daily at 8 pm. (Patient not taking: Reported on 10/23/2021) 30 capsule 0     Discharge Medications: Please see discharge summary for a list of discharge medications.  Relevant Imaging Results:  Relevant Lab Results:   Additional Information PT SSI # [id number]  [place] Peter Grassi LCSW-A  [place], [place]

## 2021-10-26 NOTE — ED Notes (Signed)
Pt on the phone apparently talking to previous group home.

## 2021-10-26 NOTE — Progress Notes (Signed)
This CSW faxed FL2 to [employer] [facility] [employer]. Fax #: [phone]

## 2021-10-26 NOTE — NC FL2 (Signed)
[name] LEVEL OF CARE SCREENING TOOL     IDENTIFICATION  Patient Name: Peter Parsons Birthdate: January 11, 1961 Sex: male Admission Date (Current Location): 10/23/2021  [facility] and [place] Number:  Peter Parsons [id number] [place] and Address:         Provider Number: [id number]  Attending Physician Name and Address:  Default, Provider, MD  Relative Name and Phone Number:  Legal Guardian [phone]    Current Level of Care: Hospital Recommended Level of Care: [place], [place], Memory Care Prior Approval Number:    Date Approved/Denied:   PASRR Number:    Discharge Plan: Other (Comment) (ALF, [place], or Group Home)    Current Diagnoses: Patient Active Problem List   Diagnosis Date Noted   Enterococcal bacteremia 05/15/2019   Hypertension 05/15/2019   Type 2 diabetes mellitus ([place]) 05/15/2019   Dyslipidemia 05/15/2019   BPH (benign prostatic hyperplasia) 05/15/2019   Normocytic anemia 05/15/2019   Thrombocytopenia ([place]) 05/15/2019   Acute urinary retention 05/15/2019   Urethral trauma 05/15/2019   UTI (urinary tract infection) 05/15/2019   Pressure injury of skin 12/05/8525   Acute metabolic encephalopathy 78/24/2353   Protein-calorie malnutrition, severe 03/29/2019   Weakness 03/27/2019   Dementia ([place]) 03/19/2019   Altered mental status    Paranoid schizophrenia ([place])     Orientation RESPIRATION BLADDER Height & Weight     Self, Time, Situation, Place  Normal Continent Weight: 198 lb (89.8 kg) Height:  '5\' 10"'$  (177.8 cm)  BEHAVIORAL SYMPTOMS/MOOD NEUROLOGICAL BOWEL NUTRITION STATUS      Continent Diet (Regular)  AMBULATORY STATUS COMMUNICATION OF NEEDS Skin   Independent Verbally Normal                       Personal Care Assistance Level of Assistance  Bathing, Feeding, Dressing Bathing Assistance: Independent Feeding assistance: Independent Dressing Assistance: Independent     Functional Limitations Info   Sight, Speech, Hearing Sight Info: Adequate Hearing Info: Adequate Speech Info: Adequate    SPECIAL CARE FACTORS FREQUENCY                       Contractures Contractures Info: Not present    Additional Factors Info  Code Status, Allergies, Psychotropic Code Status Info: Full Allergies Info: Acetaminophen Psychotropic Info: Ativan Zoloft         Current Medications (10/26/2021):  This is the current hospital active medication list Current Facility-Administered Medications  Medication Dose Route Frequency Provider Last Rate Last Admin   cloZAPine (CLOZARIL) tablet 300 mg  300 mg Oral QHS Lindon Romp A, NP       LORazepam (ATIVAN) tablet 2 mg  2 mg Oral BID Lindon Romp A, NP   2 mg at 10/26/21 1029   sertraline (ZOLOFT) tablet 200 mg  200 mg Oral Daily Lindon Romp A, NP   200 mg at 10/26/21 1029   Current Outpatient Medications  Medication Sig Dispense Refill   ARIPIPRAZOLE IM Inject into the muscle every 30 (thirty) days.     atenolol (TENORMIN) 25 MG tablet Take 25 mg by mouth at bedtime.     cloZAPine (CLOZARIL) 100 MG tablet Take 300 mg by mouth at bedtime.     ibuprofen (ADVIL) 200 MG tablet Take 1 tablet (200 mg total) by mouth every 6 (six) hours as needed for fever, headache or moderate pain. 30 tablet 0   LORazepam (ATIVAN) 0.5 MG tablet Take 1.5 mg by mouth 2 (two) times  daily.     LORazepam (ATIVAN) 2 MG tablet Take 2 mg by mouth daily.     nystatin cream (MYCOSTATIN) Apply topically 2 (two) times daily. 30 g 0   polyethylene glycol (MIRALAX / GLYCOLAX) 17 g packet Take 17 g by mouth 2 (two) times daily. 14 each 0   pravastatin (PRAVACHOL) 40 MG tablet Take 40 mg by mouth daily.     senna (SENOKOT) 8.6 MG TABS tablet Take 4 tablets by mouth 2 (two) times daily.     senna-docusate (SENOKOT-S) 8.6-50 MG tablet Take 2 tablets by mouth 2 (two) times daily. 60 tablet 0   sertraline (ZOLOFT) 100 MG tablet Take 200 mg by mouth daily.      sulfamethoxazole-trimethoprim (BACTRIM DS) 800-160 MG tablet Take 1 tablet by mouth daily.     tamsulosin (FLOMAX) 0.4 MG CAPS capsule Take 0.4 mg by mouth daily.     chlorhexidine (PERIDEX) 0.12 % solution Use as directed 15 mLs in the mouth or throat 2 (two) times daily. (Patient not taking: Reported on 10/23/2021) 120 mL 0   clonazePAM (KLONOPIN) 0.5 MG tablet Take 0.5 tablets (0.25 mg total) by mouth 3 (three) times daily as needed (anxiety). (Patient not taking: Reported on 10/23/2021) 30 tablet 0   escitalopram (LEXAPRO) 10 MG tablet Take 1 tablet (10 mg total) by mouth daily. (Patient not taking: Reported on 10/23/2021) 30 tablet 0   traZODone (DESYREL) 50 MG tablet Take 1 tablet (50 mg total) by mouth at bedtime. (Patient not taking: Reported on 10/23/2021) 30 tablet 0   ziprasidone (GEODON) 40 MG capsule Take 1 capsule (40 mg total) by mouth daily at 8 pm. (Patient not taking: Reported on 10/23/2021) 30 capsule 0   ziprasidone (GEODON) 60 MG capsule Take 1 capsule (60 mg total) by mouth daily at 8 pm. (Patient not taking: Reported on 10/23/2021) 30 capsule 0     Discharge Medications: Please see discharge summary for a list of discharge medications.  Relevant Imaging Results:  Relevant Lab Results:   Additional Information SSN: [id number]  Kimber Relic, LCSW

## 2021-10-26 NOTE — ED Notes (Signed)
Pt provided with shower and self care at this time.

## 2021-10-26 NOTE — Progress Notes (Signed)
CSW sent ouf FL2 to the Pt legal Guardian.

## 2021-10-26 NOTE — Progress Notes (Signed)
This CSW attempted to contact [name], [place] worker [phone]) but was unsuccessful. This CSW contacted DSS and was transferred to [facility], supervisor. Tasha informed that the pt's LME is [facility]. She stated he is not assigned a care coordinator at this time due to his case escalating to "complex" and needing a complex care coordinator. [name] reported that they are also searching for placement for the pt.   This CSW asked [name] if DSS could provide a sitter to interact and sit with pt during the day due to history of eloping. Tasha informed she would send it up to her leadership team and asked if we could provide an agency and they pay for it. This CSW will speak with leadership regarding that option. [name] also stated they have an old FL2 from 10/08/21 and asked if we could fax over a newer one. This CSW will complete FL2 and fax over if necessary. [name] stated she will have [name] call back with contact information for [facility]. TOC following.

## 2021-10-27 LAB — CBG MONITORING, ED
Glucose-Capillary: 140 mg/dL — ABNORMAL HIGH (ref 70–99)
Glucose-Capillary: 144 mg/dL — ABNORMAL HIGH (ref 70–99)

## 2021-10-27 NOTE — ED Notes (Signed)
Pt requested personal sitter because he feels lonely.

## 2021-10-27 NOTE — ED Provider Notes (Signed)
Emergency Medicine Observation Re-evaluation Note  Peter Parsons is a 61 y.o. male, seen on rounds today.  Pt initially presented to the ED for complaints of Psychiatric Evaluation and Anxiety Currently, the patient is asleep.  Physical Exam  BP 118/65 (BP Location: Left Arm)   Pulse 91   Temp 97.6 F (36.4 C) (Oral)   Resp 16   Ht '5\' 10"'$  (1.778 m)   Wt 89.8 kg   SpO2 97%   BMI 28.41 kg/m  Physical Exam General: asleep Cardiac: rr Lungs: clear Psych: asleep  ED Course / MDM  EKG:EKG Interpretation  Date/Time:  Wednesday October 24 2021 05:46:01 EDT Ventricular Rate:  74 PR Interval:  136 QRS Duration: 144 QT Interval:  408 QTC Calculation: 453 R Axis:   -75 Text Interpretation: Sinus rhythm RBBB and LAFB When compared with ECG of 05/14/2019, Right bundle branch block is now present Confirmed by Delora Fuel ([id number]) on 10/25/2021 12:29:06 AM  I have reviewed the labs performed to date as well as medications administered while in observation.  Recent changes in the last 24 hours include none.  Plan  Current plan is for awaiting placement.  Per notes, pt has a hx of DM2.  He is not taking any DM meds and he's been eating a regular diet.  I will change diet and recheck BS.    Peter Pence, MD 10/27/21 1019

## 2021-10-27 NOTE — ED Notes (Signed)
Pt provided with crackers and peanut butter. Pt was advised that this would be the last of the snacks for the evening. Pt understood and agreed.

## 2021-10-28 NOTE — ED Provider Notes (Signed)
Emergency Medicine Observation Re-evaluation Note  Peter Parsons is a 61 y.o. male, seen on rounds today.  Pt initially presented to the ED for complaints of Psychiatric Evaluation and Anxiety Currently, the patient is sleeping.  Physical Exam  BP 114/68   Pulse 92   Temp 97.9 F (36.6 C)   Resp 16   Ht '5\' 10"'$  (1.778 m)   Wt 89.8 kg   SpO2 97%   BMI 28.41 kg/m  Physical Exam General: No acute distress Cardiac: Well-perfused Lungs: Nonlabored Psych: Cooperative  ED Course / MDM  EKG:EKG Interpretation  Date/Time:  Wednesday October 24 2021 05:46:01 EDT Ventricular Rate:  74 PR Interval:  136 QRS Duration: 144 QT Interval:  408 QTC Calculation: 453 R Axis:   -75 Text Interpretation: Sinus rhythm RBBB and LAFB When compared with ECG of 05/14/2019, Right bundle branch block is now present Confirmed by Delora Fuel ([id number]) on 10/25/2021 12:29:06 AM  I have reviewed the labs performed to date as well as medications administered while in observation.  Recent changes in the last 24 hours include patient started on sliding scale.  Plan  Current plan is for question guardianship and placement.    Hayden Rasmussen, MD 10/28/21 [phone]

## 2021-10-28 NOTE — ED Notes (Signed)
Pt is pleasant and cooperative this morning, though very anxious and having difficulty staying within [place] area. Repeatedly approaching staff seeking reassurance, asking the same questions over and over, etc. Redirectable at this time.

## 2021-10-29 NOTE — ED Provider Notes (Addendum)
Emergency Medicine Observation Re-evaluation Note  Peter Parsons is a 61 y.o. male, seen on rounds today.  Pt initially presented to the ED for complaints of Psychiatric Evaluation and Anxiety Currently, the patient is sleeping.  Physical Exam  BP 109/66   Pulse 64   Temp 98 F (36.7 C) (Oral)   Resp 16   Ht '5\' 10"'$  (1.778 m)   Wt 89.8 kg   SpO2 99%   BMI 28.41 kg/m  Physical Exam General: NAD Cardiac: Well-perfused Lungs: Even and unlabored Psych: Cooperative  ED Course / MDM  EKG:EKG Interpretation  Date/Time:  Wednesday October 24 2021 05:46:01 EDT Ventricular Rate:  74 PR Interval:  136 QRS Duration: 144 QT Interval:  408 QTC Calculation: 453 R Axis:   -75 Text Interpretation: Sinus rhythm RBBB and LAFB When compared with ECG of 05/14/2019, Right bundle branch block is now present Confirmed by Delora Fuel ([id number]) on 10/25/2021 12:29:06 AM  I have reviewed the labs performed to date as well as medications administered while in observation.  Recent changes in the last 24 hours include none.  Plan  Pt has been medically and psychiatrically cleared. Current plan is for social work coordinating with patient guardian and placement.        Regan Lemming, MD 10/29/21 0300    Regan Lemming, MD 10/29/21 [phone]

## 2021-10-29 NOTE — ED Notes (Signed)
Bedding changed at this time due to urination accident, new scrubs provided

## 2021-10-29 NOTE — Progress Notes (Signed)
This CSW spoke with [name], APS worker, to inquire about an update regarding placement. [name] reported that their director has not given an update regarding providing a sitter at this time but he will follow up with his supervisor, [name]. This CSW provided him the [place] that [facility] has provided a sitter through previously. TOC following.

## 2021-10-29 NOTE — ED Notes (Signed)
Will obtain vitals when pt wakes

## 2021-10-29 NOTE — ED Notes (Signed)
Pt walked back to TCU with receiving nurse along with one pt belonging's bag

## 2021-10-29 NOTE — Progress Notes (Addendum)
CM reached out to [facility] Phifer [phone] ext [phone]) and left VM requesting a call back.

## 2021-10-30 NOTE — Progress Notes (Addendum)
CM spoke with [facility] health who reports a CC has been assigned to the case, Waldon Reining [phone] ext [phone].  Deloria Lair reports Larene Beach is in training through Thursday of this week.  Cm and [facility] discussed scheduling weekly meetings to touch base on placement efforts, this CM provided available time frames for the meetings.  [facility] health rep and Larene Beach to review their calendar and confirm meeting dates and times.  Wiley Ford plans to outreach to [facility] and coordinate his presence for these meetings as well.  TOC is awaiting confirmation of all parties availability to [facility] forward with scheduling a meeting.  Care coordinator contact information added to AVS, call placed to LG to update on care coordinator assignment, contact information provided for new CC.   LG reports still awaiting response from his leadership regarding the possibility of DSS funded sitter.  Thus far no placement options have been identified.  [name] report he is contacting group homes and assisted living facilities.   Per LG patient was at [facility] for over a year, due to behaviors.  Reports patient received treatment that addressed all his behavior concerns, patient has not had any behaviors since discharge from [facility] to [facility]. Reports patient has impending thoughts of doom which is his baseline, current behavior concern is that patient elopes, believes this is related to his dementia diagnosis. States patient was placed into respite home for temporary placement prior to arriving at this hospital.  Patient continues to be an elopement risk per LG. LG reports he has received the updated FL2 that was sent to him and it reflects his dementia diagnosis which he is hopeful will assist in a memory care placement, which he is pursuing through out the state.

## 2021-10-30 NOTE — ED Notes (Signed)
Patient asleep for breakfast at this time

## 2021-10-30 NOTE — ED Notes (Signed)
Patient alert. Cooperative. Appropriate with staff.  Medication compliant.  Patient ate breakfast.

## 2021-10-30 NOTE — ED Provider Notes (Addendum)
Emergency Medicine Observation Re-evaluation Note  Peter Parsons is a 61 y.o. male, seen on rounds today.  Pt initially presented to the ED for complaints of Psychiatric Evaluation and Anxiety Currently, the patient is sleeping.  Physical Exam  BP 130/85 (BP Location: Right Arm)   Pulse 70   Temp 97.7 F (36.5 C) (Oral)   Resp 18   Ht '5\' 10"'$  (1.778 m)   Wt 89.8 kg   SpO2 98%   BMI 28.41 kg/m  Physical Exam General: NAD Cardiac: Well-perfused Lungs: Even and unlabored Psych: No agitation  ED Course / MDM  EKG:EKG Interpretation  Date/Time:  Wednesday October 24 2021 05:46:01 EDT Ventricular Rate:  74 PR Interval:  136 QRS Duration: 144 QT Interval:  408 QTC Calculation: 453 R Axis:   -75 Text Interpretation: Sinus rhythm RBBB and LAFB When compared with ECG of 05/14/2019, Right bundle branch block is now present Confirmed by Delora Fuel ([id number]) on 10/25/2021 12:29:06 AM  I have reviewed the labs performed to date as well as medications administered while in observation.  Recent changes in the last 24 hours include none.  Plan  Pt has been medically and psychiatrically cleared. Current plan is for social work coordinating with patient guardian and placement.        Regan Lemming, MD 10/29/21 5631    Regan Lemming, MD 10/29/21 4970    Regan Lemming, MD 10/30/21 0800    Regan Lemming, MD 10/30/21 0800

## 2021-10-30 NOTE — ED Notes (Signed)
Patient calm and pleasant, slept all night.

## 2021-10-31 MED ORDER — NICOTINE 14 MG/24HR TD PT24
14.0000 mg | MEDICATED_PATCH | Freq: Every day | TRANSDERMAL | Status: DC
  Administered 2021-10-31 – 2022-02-01 (×76): 14 mg via TRANSDERMAL
  Filled 2021-10-31 (×83): qty 1

## 2021-10-31 NOTE — ED Provider Notes (Signed)
Emergency Medicine Observation Re-evaluation Note  Peter Parsons is a 61 y.o. male, seen on rounds today.  Pt initially presented to the ED for complaints of Psychiatric Evaluation and Anxiety Currently, the patient is sleeping.  Physical Exam  BP 117/76 (BP Location: Right Arm)   Pulse (!) 104   Temp 97.9 F (36.6 C) (Oral)   Resp 16   Ht '5\' 10"'$  (1.778 m)   Wt 89.8 kg   SpO2 98%   BMI 28.41 kg/m  Physical Exam General: NAD Cardiac: Well-perfused Lungs: Even and unlabored Psych: No agitation  ED Course / MDM  EKG:EKG Interpretation  Date/Time:  Wednesday October 24 2021 05:46:01 EDT Ventricular Rate:  74 PR Interval:  136 QRS Duration: 144 QT Interval:  408 QTC Calculation: 453 R Axis:   -75 Text Interpretation: Sinus rhythm RBBB and LAFB When compared with ECG of 05/14/2019, Right bundle branch block is now present Confirmed by Delora Fuel ([id number]) on 10/25/2021 12:29:06 AM  I have reviewed the labs performed to date as well as medications administered while in observation.  Recent changes in the last 24 hours include none.  Plan  Pt has been medically and psychiatrically cleared. Current plan is for social work coordinating with patient guardian and placement.           Deno Etienne, DO 10/31/21 [phone]

## 2021-10-31 NOTE — ED Notes (Signed)
Patient very calm, cooperative and pleasant. After his HS medications he slept the rest of the night.

## 2021-10-31 NOTE — ED Notes (Signed)
Patient requesting nicotine patch. He states he smokes 5 Winston 100s per day at the group home. Notified Dr. Doren Custard

## 2021-10-31 NOTE — ED Notes (Addendum)
Patient really wants to go back to the group home he came from. We provided him paper and envelope so he could apologize to the group home owners. He asked them to please forgive him so he can come back.

## 2021-11-01 LAB — CBC WITH DIFFERENTIAL/PLATELET
Abs Immature Granulocytes: 0.02 10*3/uL (ref 0.00–0.07)
Basophils Absolute: 0 10*3/uL (ref 0.0–0.1)
Basophils Relative: 1 %
Eosinophils Absolute: 0 10*3/uL (ref 0.0–0.5)
Eosinophils Relative: 0 %
HCT: 39 % (ref 39.0–52.0)
Hemoglobin: 13.4 g/dL (ref 13.0–17.0)
Immature Granulocytes: 0 %
Lymphocytes Relative: 36 %
Lymphs Abs: 2.3 10*3/uL (ref 0.7–4.0)
MCH: 30.6 pg (ref 26.0–34.0)
MCHC: 34.4 g/dL (ref 30.0–36.0)
MCV: 89 fL (ref 80.0–100.0)
Monocytes Absolute: 0.6 10*3/uL (ref 0.1–1.0)
Monocytes Relative: 10 %
Neutro Abs: 3.3 10*3/uL (ref 1.7–7.7)
Neutrophils Relative %: 53 %
Platelets: 133 10*3/uL — ABNORMAL LOW (ref 150–400)
RBC: 4.38 MIL/uL (ref 4.22–5.81)
RDW: 14.2 % (ref 11.5–15.5)
WBC: 6.3 10*3/uL (ref 4.0–10.5)
nRBC: 0 % (ref 0.0–0.2)

## 2021-11-01 NOTE — ED Notes (Signed)
Pt has been calm for the shift, occasionally coming out of their room. Pt easily redirectable back into their room.

## 2021-11-01 NOTE — ED Notes (Signed)
Pt expressed anxiety about saying "the wrong things". Pt assured they have done or said nothing wrong here and they are in a safe place.

## 2021-11-01 NOTE — ED Provider Notes (Signed)
Emergency Medicine Observation Re-evaluation Note  Anthony Tamburo is a 61 y.o. male, seen on rounds today.  Pt initially presented to the ED for complaints of Psychiatric Evaluation and Anxiety Currently, the patient is resting comfortably.  Physical Exam  BP 126/72 (BP Location: Left Arm)   Pulse 70   Temp (!) 97.3 F (36.3 C) (Oral)   Resp 18   Ht '5\' 10"'$  (1.778 m)   Wt 89.8 kg   SpO2 98%   BMI 28.41 kg/m  Physical Exam General: NAD Cardiac: Regular rate Lungs: No respiratory distress Psych: No agitation  ED Course / MDM  EKG:EKG Interpretation  Date/Time:  Wednesday October 24 2021 05:46:01 EDT Ventricular Rate:  74 PR Interval:  136 QRS Duration: 144 QT Interval:  408 QTC Calculation: 453 R Axis:   -75 Text Interpretation: Sinus rhythm RBBB and LAFB When compared with ECG of 05/14/2019, Right bundle branch block is now present Confirmed by Delora Fuel ([id number]) on 10/25/2021 12:29:06 AM  I have reviewed the labs performed to date as well as medications administered while in observation.  Recent changes in the last 24 hours include none.  Plan  Current plan is for placement. Medically and psychiatrically cleared.    Cristie Hem, MD 11/01/21 [phone]

## 2021-11-01 NOTE — ED Notes (Signed)
Every shift I have worked with this patient, he has mentioned how he made a mistake running away from the group home and how sorry he is for his mistake. He has written a couple of letters for [place], [place], at [phone] stating he really apologizes for his mistake.

## 2021-11-02 MED ORDER — LORAZEPAM 1 MG PO TABS
2.0000 mg | ORAL_TABLET | Freq: Every day | ORAL | Status: DC | PRN
  Administered 2021-11-02 – 2022-01-28 (×44): 2 mg via ORAL
  Filled 2021-11-02 (×44): qty 2

## 2021-11-02 NOTE — ED Notes (Signed)
Pt calm and cooperative from 7:00 AM to present. Pt remorseful for leaving group home and wrote multiple apology letters to the group home. Pt called the group home multiple times throughout the day but no one answered. Pt wants to return to the group home ASAP.

## 2021-11-02 NOTE — ED Notes (Signed)
Patient was pleasant, calm, and cooperative with medications. He slept most of the shift after taking his HS medications.

## 2021-11-02 NOTE — Progress Notes (Signed)
LG requests patient's meds are re-evaluated.  Shares patient may benefit from PRN medication when patient gets "antsy".  EDP was updated of this request.

## 2021-11-02 NOTE — ED Notes (Signed)
Patient calm and cooperative.  Requesting medication to help him relax and for mild anxiety.  Medicated per PRN order.  Patient updated on plan of care

## 2021-11-02 NOTE — Progress Notes (Signed)
TOC has a care meeting with the pt's legal guardian and care coordinator from [place] today at 1 PM.

## 2021-11-03 NOTE — ED Notes (Signed)
Pt requesting something for anxiety, PRN's to be administered.

## 2021-11-03 NOTE — ED Provider Notes (Signed)
Emergency Medicine Observation Re-evaluation Note  Peter Parsons is a 61 y.o. male with hx of paranoid schizophrenia and dementia seen on rounds today.  Pt initially presented to the ED for complaints of Psychiatric Evaluation for SI and Anxiety Yesterday SW met with the patient's legal guardian and [place]. Pt states that he regrets leaving his group home.   Physical Exam  BP 123/79 (BP Location: Right Arm)   Pulse 72   Temp 97.9 F (36.6 C) (Oral)   Resp 16   Ht 5' 10"  (1.778 m)   Wt 89.8 kg   SpO2 98%   BMI 28.41 kg/m  Physical Exam General: Resting comfortably in stretcher Lungs: No acute distress Psych: Calm  ED Course / MDM  EKG:EKG Interpretation  Date/Time:  Wednesday October 24 2021 05:46:01 EDT Ventricular Rate:  74 PR Interval:  136 QRS Duration: 144 QT Interval:  408 QTC Calculation: 453 R Axis:   -75 Text Interpretation: Sinus rhythm RBBB and LAFB When compared with ECG of 05/14/2019, Right bundle branch block is now present Confirmed by Delora Fuel ([id number]) on 10/25/2021 12:29:06 AM  I have reviewed the labs performed to date as well as medications administered while in observation.  Recent changes in the last 24 hours include that the patient is remorseful about leaving his group home.  Plan  Current plan is for placement.     Fransico Meadow, MD 11/03/21 [phone]

## 2021-11-03 NOTE — ED Notes (Signed)
Patient calm and cooperative during shift.  Had brief moment of anxiety and requested medication.  Medication help with systems and patient was thankful.  Appears to have slept through night and rested well.

## 2021-11-03 NOTE — ED Notes (Signed)
Pt calm, cooperative, and pleasant from 7:00 AM to present. Pt says he feels anxious because he feels like he did something wrong and can't fix it. Pt says, "I feel like the other shoe is going to drop." Pt remorseful for leaving the group home without signing out. Pt denies SI, HI, and AVH. Pt performs ADLs independently.

## 2021-11-04 NOTE — ED Notes (Signed)
Pt slept until 11:00 AM. Pt was calm and cooperative. Pt denies SI, HI, and AVH. Pt performs ADLs independently. Pt medication compliant.

## 2021-11-04 NOTE — ED Provider Notes (Signed)
Emergency Medicine Observation Re-evaluation Note  Peter Parsons is a 61 y.o. male, with history of paranoid schizophrenia and dementia seen on rounds today.  Pt initially presented to the ED for complaints of Psychiatric Evaluation and Anxiety Currently, the patient is resting comfortably in his room.Marland Kitchen  Physical Exam  BP 110/67 (BP Location: Left Arm)   Pulse 72   Temp 98 F (36.7 C) (Oral)   Resp 17   Ht '5\' 10"'$  (1.778 m)   Wt 89.8 kg   SpO2 97%   BMI 28.41 kg/m  Physical Exam General: Resting comfortably in room Cardiac: Regular rate Lungs: Breathing comfortably Psych: Calm  ED Course / MDM  EKG:EKG Interpretation  Date/Time:  Wednesday October 24 2021 05:46:01 EDT Ventricular Rate:  74 PR Interval:  136 QRS Duration: 144 QT Interval:  408 QTC Calculation: 453 R Axis:   -75 Text Interpretation: Sinus rhythm RBBB and LAFB When compared with ECG of 05/14/2019, Right bundle branch block is now present Confirmed by Delora Fuel ([id number]) on 10/25/2021 12:29:06 AM  I have reviewed the labs performed to date as well as medications administered while in observation.  Recent changes in the last 24 hours include no acute events overnight.  Plan  Current plan is for placement.    Elgie Congo, MD 11/04/21 [phone]

## 2021-11-05 NOTE — ED Provider Notes (Signed)
Emergency Medicine Observation Re-evaluation Note  Peter Parsons is a 61 y.o. male, seen on rounds today.  Pt initially presented to the ED for complaints of Psychiatric Evaluation and Anxiety Currently, the patient is sleeping.  Physical Exam  BP 112/72 (BP Location: Left Arm)   Pulse 78   Temp 97.6 F (36.4 C) (Oral)   Resp 20   Ht '5\' 10"'$  (1.778 m)   Wt 89.8 kg   SpO2 98%   BMI 28.41 kg/m  Physical Exam General: Sleeping Cardiac: Extremities well-perfused Lungs: Breathing is unlabored Psych: Deferred  ED Course / MDM  EKG:EKG Interpretation  Date/Time:  Wednesday October 24 2021 05:46:01 EDT Ventricular Rate:  74 PR Interval:  136 QRS Duration: 144 QT Interval:  408 QTC Calculation: 453 R Axis:   -75 Text Interpretation: Sinus rhythm RBBB and LAFB When compared with ECG of 05/14/2019, Right bundle branch block is now present Confirmed by Delora Fuel ([id number]) on 10/25/2021 12:29:06 AM  I have reviewed the labs performed to date as well as medications administered while in observation.  Recent changes in the last 24 hours include none.  Plan  Current plan is for social work placement.    Godfrey Pick, MD 11/05/21 [phone]

## 2021-11-06 NOTE — ED Provider Notes (Signed)
Emergency Medicine Observation Re-evaluation Note  Peter Parsons is a 61 y.o. male, seen on rounds today.  Pt initially presented to the ED for complaints of Psychiatric Evaluation and Anxiety Currently, the patient is sleeping.  Physical Exam  BP 135/73 (BP Location: Left Arm)   Pulse 76   Temp 97.9 F (36.6 C) (Oral)   Resp 18   Ht '5\' 10"'$  (1.778 m)   Wt 89.8 kg   SpO2 99%   BMI 28.41 kg/m  Physical Exam General: Sleeping Cardiac: Extremities well-perfused Lungs: Breathing is unlabored Psych: Deferred  ED Course / MDM  EKG:EKG Interpretation  Date/Time:  Wednesday October 24 2021 05:46:01 EDT Ventricular Rate:  74 PR Interval:  136 QRS Duration: 144 QT Interval:  408 QTC Calculation: 453 R Axis:   -75 Text Interpretation: Sinus rhythm RBBB and LAFB When compared with ECG of 05/14/2019, Right bundle branch block is now present Confirmed by Delora Fuel ([id number]) on 10/25/2021 12:29:06 AM  I have reviewed the labs performed to date as well as medications administered while in observation.  Recent changes in the last 24 hours include none.  Plan  Current plan is for social work placement.    Godfrey Pick, MD 11/06/21 [phone]

## 2021-11-06 NOTE — Progress Notes (Signed)
TOC to fax out new FL2 once meds are adjusted.

## 2021-11-06 NOTE — ED Notes (Addendum)
Patient is pleasant, calm, and cooperative. He is compliant with medications and independent with ADLs. He sleeps most of shift after taking HS medications.

## 2021-11-07 NOTE — ED Notes (Signed)
Pt remains asleep at this time. Will give medications when pt awakens.

## 2021-11-07 NOTE — NC FL2 (Signed)
[place] LEVEL OF CARE SCREENING TOOL     IDENTIFICATION  Patient Name: Peter Parsons Birthdate: September 07, 1960 Sex: male Admission Date (Current Location): 10/23/2021  [place] and [place] Number:  [name] [id number] [place] and Address:  [facility],  [place], [place]      Provider Number: [id number]  Attending Physician Name and Address:  Default, Provider, MD  Relative Name and Phone Number:  Legal [name] [phone]    Current Level of Care: Hospital Recommended Level of Care: [place], Memory Care Prior Approval Number:    Date Approved/Denied:   PASRR Number:    Discharge Plan: Other (Comment) (Memory Care)    Current Diagnoses: Patient Active Problem List   Diagnosis Date Noted   Enterococcal bacteremia 05/15/2019   Hypertension 05/15/2019   Type 2 diabetes mellitus ([place]) 05/15/2019   Dyslipidemia 05/15/2019   BPH (benign prostatic hyperplasia) 05/15/2019   Normocytic anemia 05/15/2019   Thrombocytopenia ([place]) 05/15/2019   Acute urinary retention 05/15/2019   Urethral trauma 05/15/2019   UTI (urinary tract infection) 05/15/2019   Pressure injury of skin 07/37/1062   Acute metabolic encephalopathy 69/48/5462   Protein-calorie malnutrition, severe 03/29/2019   Weakness 03/27/2019   Dementia ([place]) 03/19/2019   Altered mental status    Paranoid schizophrenia ([place])     Orientation RESPIRATION BLADDER Height & Weight     Self, Time, Situation, Place  Normal Continent Weight: 198 lb (89.8 kg) Height:  '5\' 10"'$  (177.8 cm)  BEHAVIORAL SYMPTOMS/MOOD NEUROLOGICAL BOWEL NUTRITION STATUS      Continent Diet (Regular)  AMBULATORY STATUS COMMUNICATION OF NEEDS Skin   Independent Verbally Normal                       Personal Care Assistance Level of Assistance  Bathing, Feeding, Dressing Bathing Assistance: Independent Feeding assistance: Independent Dressing Assistance:  Independent     Functional Limitations Info  Sight, Hearing, Speech Sight Info: Adequate Hearing Info: Adequate Speech Info: Adequate    SPECIAL CARE FACTORS FREQUENCY                       Contractures Contractures Info: Not present    Additional Factors Info  Code Status, Allergies, Psychotropic Code Status Info: Full Allergies Info: Acetaminophen Psychotropic Info: Clozaril Ativan Zoloft         Current Medications (11/07/2021):  This is the current hospital active medication list Current Facility-Administered Medications  Medication Dose Route Frequency Provider Last Rate Last Admin   cloZAPine (CLOZARIL) tablet 300 mg  300 mg Oral QHS Lindon Romp A, NP   300 mg at 11/06/21 2148   LORazepam (ATIVAN) tablet 2 mg  2 mg Oral BID Lindon Romp A, NP   2 mg at 11/07/21 1115   LORazepam (ATIVAN) tablet 2 mg  2 mg Oral Daily PRN Sherwood Gambler, MD   2 mg at 11/03/21 1938   nicotine (NICODERM CQ - dosed in mg/24 hours) patch 14 mg  14 mg Transdermal Daily Godfrey Pick, MD   14 mg at 11/07/21 1115   sertraline (ZOLOFT) tablet 200 mg  200 mg Oral Daily Lindon Romp A, NP   200 mg at 11/07/21 1114   Current Outpatient Medications  Medication Sig Dispense Refill   ARIPIPRAZOLE IM Inject into the muscle every 30 (thirty) days.     atenolol (TENORMIN) 25 MG tablet Take 25 mg by mouth at bedtime.  cloZAPine (CLOZARIL) 100 MG tablet Take 300 mg by mouth at bedtime.     ibuprofen (ADVIL) 200 MG tablet Take 1 tablet (200 mg total) by mouth every 6 (six) hours as needed for fever, headache or moderate pain. 30 tablet 0   LORazepam (ATIVAN) 0.5 MG tablet Take 1.5 mg by mouth 2 (two) times daily.     LORazepam (ATIVAN) 2 MG tablet Take 2 mg by mouth daily.     nystatin cream (MYCOSTATIN) Apply topically 2 (two) times daily. 30 g 0   polyethylene glycol (MIRALAX / GLYCOLAX) 17 g packet Take 17 g by mouth 2 (two) times daily. 14 each 0   pravastatin (PRAVACHOL) 40 MG tablet Take 40  mg by mouth daily.     senna (SENOKOT) 8.6 MG TABS tablet Take 4 tablets by mouth 2 (two) times daily.     senna-docusate (SENOKOT-S) 8.6-50 MG tablet Take 2 tablets by mouth 2 (two) times daily. 60 tablet 0   sertraline (ZOLOFT) 100 MG tablet Take 200 mg by mouth daily.     tamsulosin (FLOMAX) 0.4 MG CAPS capsule Take 0.4 mg by mouth daily.     chlorhexidine (PERIDEX) 0.12 % solution Use as directed 15 mLs in the mouth or throat 2 (two) times daily. (Patient not taking: Reported on 10/23/2021) 120 mL 0   clonazePAM (KLONOPIN) 0.5 MG tablet Take 0.5 tablets (0.25 mg total) by mouth 3 (three) times daily as needed (anxiety). (Patient not taking: Reported on 10/23/2021) 30 tablet 0   escitalopram (LEXAPRO) 10 MG tablet Take 1 tablet (10 mg total) by mouth daily. (Patient not taking: Reported on 10/23/2021) 30 tablet 0   traZODone (DESYREL) 50 MG tablet Take 1 tablet (50 mg total) by mouth at bedtime. (Patient not taking: Reported on 10/23/2021) 30 tablet 0   ziprasidone (GEODON) 40 MG capsule Take 1 capsule (40 mg total) by mouth daily at 8 pm. (Patient not taking: Reported on 10/23/2021) 30 capsule 0   ziprasidone (GEODON) 60 MG capsule Take 1 capsule (60 mg total) by mouth daily at 8 pm. (Patient not taking: Reported on 10/23/2021) 30 capsule 0     Discharge Medications: Please see discharge summary for a list of discharge medications.  Relevant Imaging Results:  Relevant Lab Results:   Additional Information SSN: [id number]  Kimber Relic, LCSW

## 2021-11-07 NOTE — ED Notes (Signed)
Pt resting comfortably at this time.

## 2021-11-07 NOTE — ED Provider Notes (Signed)
Emergency Medicine Observation Re-evaluation Note  Peter Parsons is a 61 y.o. male, seen on rounds today.  Pt initially presented to the ED for complaints of Psychiatric Evaluation and Anxiety Currently, the patient is asleep.  Physical Exam  BP 139/84 (BP Location: Left Arm)   Pulse 94   Temp 98.4 F (36.9 C) (Oral)   Resp 16   Ht '5\' 10"'$  (1.778 m)   Wt 89.8 kg   SpO2 100%   BMI 28.41 kg/m  Physical Exam General: Sleeping comfortably Cardiac: Regular rate Lungs: No respiratory distress Psych: Asleep  ED Course / MDM  EKG:EKG Interpretation  Date/Time:  Wednesday October 24 2021 05:46:01 EDT Ventricular Rate:  74 PR Interval:  136 QRS Duration: 144 QT Interval:  408 QTC Calculation: 453 R Axis:   -75 Text Interpretation: Sinus rhythm RBBB and LAFB When compared with ECG of 05/14/2019, Right bundle branch block is now present Confirmed by Delora Fuel ([id number]) on 10/25/2021 12:29:06 AM  I have reviewed the labs performed to date as well as medications administered while in observation.  Recent changes in the last 24 hours include none.  Plan  Current plan is for social work working on placement.    Cristie Hem, MD 11/07/21 [phone]

## 2021-11-07 NOTE — ED Notes (Signed)
Pt out to nurses station with complaints of his mind racing. States he is afraid that we will not find somewhere for him to go. Pt informed that we are still working on somewhere. Pt more at ease knowing we are still working on finding placement for him. Pt back to room independently.

## 2021-11-08 LAB — CBC WITH DIFFERENTIAL/PLATELET
Abs Immature Granulocytes: 0.01 10*3/uL (ref 0.00–0.07)
Basophils Absolute: 0 10*3/uL (ref 0.0–0.1)
Basophils Relative: 1 %
Eosinophils Absolute: 0 10*3/uL (ref 0.0–0.5)
Eosinophils Relative: 0 %
HCT: 38.8 % — ABNORMAL LOW (ref 39.0–52.0)
Hemoglobin: 13.6 g/dL (ref 13.0–17.0)
Immature Granulocytes: 0 %
Lymphocytes Relative: 40 %
Lymphs Abs: 2.2 10*3/uL (ref 0.7–4.0)
MCH: 31.3 pg (ref 26.0–34.0)
MCHC: 35.1 g/dL (ref 30.0–36.0)
MCV: 89.4 fL (ref 80.0–100.0)
Monocytes Absolute: 0.5 10*3/uL (ref 0.1–1.0)
Monocytes Relative: 8 %
Neutro Abs: 2.7 10*3/uL (ref 1.7–7.7)
Neutrophils Relative %: 51 %
Platelets: 126 10*3/uL — ABNORMAL LOW (ref 150–400)
RBC: 4.34 MIL/uL (ref 4.22–5.81)
RDW: 13.8 % (ref 11.5–15.5)
WBC: 5.5 10*3/uL (ref 4.0–10.5)
nRBC: 0 % (ref 0.0–0.2)

## 2021-11-08 NOTE — ED Provider Notes (Signed)
Emergency Medicine Observation Re-evaluation Note  Peter Parsons is a 61 y.o. male, seen on rounds today.  Pt initially presented to the ED for complaints of Psychiatric Evaluation and Anxiety Currently, the patient is hemodynamically stable in no acute distress.  He was resting comfortably this morning.Marland Kitchen  Physical Exam  BP 117/77 (BP Location: Right Arm)   Pulse 72   Temp (!) 97.4 F (36.3 C) (Oral)   Resp 16   Ht '5\' 10"'$  (1.778 m)   Wt 89.8 kg   SpO2 99%   BMI 28.41 kg/m  Physical Exam General: Appears to be resting comfortably in bed, no acute distress. Cardiac: Regular rate, normal heart rate, non-emergent blood pressure for this morning's vitals. Lungs: No increased work of breathing.  Equal chest rise appreciated Psych: Calm, asleep in bed.   ED Course / MDM  EKG:EKG Interpretation  Date/Time:  Wednesday October 24 2021 05:46:01 EDT Ventricular Rate:  74 PR Interval:  136 QRS Duration: 144 QT Interval:  408 QTC Calculation: 453 R Axis:   -75 Text Interpretation: Sinus rhythm RBBB and LAFB When compared with ECG of 05/14/2019, Right bundle branch block is now present Confirmed by Delora Fuel ([id number]) on 10/25/2021 12:29:06 AM  I have reviewed the labs performed to date as well as medications administered while in observation.  Recent changes in the last 24 hours include an episode of agitation.  Plan  Current plan is for psychiatric consultation for recommendations on management plan. Awaiting placement options per social work.   Tretha Sciara, MD 11/08/21 [phone]

## 2021-11-08 NOTE — Progress Notes (Signed)
Pharmacy - Clozapine     This patient's order has been reviewed for prescribing contraindications.   Labs:  [facility] 2700 9/28.  The medication is being dispensed pursuant to the FDA REMS suspension order of 12/31/19 that allows for dispensing without a patient REMS dispense authorization (RDA). - weekly inpatient monitoring.   Tylique Aull S. Alford Highland, PharmD, BCPS Clinical Staff Pharmacist [email or web address] Alford Highland, [employer]

## 2021-11-08 NOTE — Progress Notes (Addendum)
Referral sent out to [facility] on 11/07/21 via email. Left voicemail for [facility]. Will follow up again today.   Addend @ 10:20 AM Followed up via email with [facility]. Emailed referral to [facility]. TOC following.

## 2021-11-09 MED ORDER — TAMSULOSIN HCL 0.4 MG PO CAPS
0.4000 mg | ORAL_CAPSULE | Freq: Every day | ORAL | Status: DC
  Administered 2021-11-09 – 2022-02-01 (×84): 0.4 mg via ORAL
  Filled 2021-11-09 (×90): qty 1

## 2021-11-09 NOTE — Progress Notes (Addendum)
[facility] has declined the pt at this time.   This CSW has contacted:  [facility] in [place]: Emailed referral. [place]: [place] is private pay.  [facility] [place]: Faxed referral. Admissions rep out until Monday. [facility]: Faxed referral.

## 2021-11-09 NOTE — NC FL2 (Signed)
[place] LEVEL OF CARE SCREENING TOOL     IDENTIFICATION  Patient Name: Peter Parsons Birthdate: 10-14-1960 Sex: male Admission Date (Current Location): 10/23/2021  [place] and [place] Number:  [name] [id number] [place] and Address:  [facility],  [place] [address] [place]      Provider Number: [id number]  Attending Physician Name and Address:  Default, Provider, MD  Relative Name and Phone Number:  Legal [name] [phone]    Current Level of Care: Hospital Recommended Level of Care: Other (Comment) (group home) Prior Approval Number:    Date Approved/Denied:   PASRR Number:    Discharge Plan: Other (Comment) (group home)    Current Diagnoses: Patient Active Problem List   Diagnosis Date Noted   Enterococcal bacteremia 05/15/2019   Hypertension 05/15/2019   Type 2 diabetes mellitus ([place]) 05/15/2019   Dyslipidemia 05/15/2019   BPH (benign prostatic hyperplasia) 05/15/2019   Normocytic anemia 05/15/2019   Thrombocytopenia ([place]) 05/15/2019   Acute urinary retention 05/15/2019   Urethral trauma 05/15/2019   UTI (urinary tract infection) 05/15/2019   Pressure injury of skin 22/03/5425   Acute metabolic encephalopathy 08/04/7626   Protein-calorie malnutrition, severe 03/29/2019   Weakness 03/27/2019   Dementia ([place]) 03/19/2019   Altered mental status    Paranoid schizophrenia ([facility])     Orientation RESPIRATION BLADDER Height & Weight     Self, Time, Situation, Place  Normal Continent Weight: 89.8 kg Height:  '5\' 10"'$  (177.8 cm)  BEHAVIORAL SYMPTOMS/MOOD NEUROLOGICAL BOWEL NUTRITION STATUS      Continent Diet (Regular)  AMBULATORY STATUS COMMUNICATION OF NEEDS Skin   Independent Verbally Normal                       Personal Care Assistance Level of Assistance  Bathing, Feeding, Dressing Bathing Assistance: Independent Feeding assistance: Independent Dressing Assistance: Independent     Functional  Limitations Info  Sight, Hearing, Speech Sight Info: Adequate Hearing Info: Adequate Speech Info: Adequate    SPECIAL CARE FACTORS FREQUENCY                       Contractures Contractures Info: Not present    Additional Factors Info  Code Status, Allergies, Psychotropic Code Status Info: Full Allergies Info: Acetaminophen Psychotropic Info: Clozaril, Ativan, Zoloft         Current Medications (11/09/2021):  This is the current hospital active medication list Current Facility-Administered Medications  Medication Dose Route Frequency Provider Last Rate Last Admin   cloZAPine (CLOZARIL) tablet 300 mg  300 mg Oral QHS Lindon Romp A, NP   300 mg at 11/08/21 2208   LORazepam (ATIVAN) tablet 2 mg  2 mg Oral BID Lindon Romp A, NP   2 mg at 11/09/21 1144   LORazepam (ATIVAN) tablet 2 mg  2 mg Oral Daily PRN Sherwood Gambler, MD   2 mg at 11/08/21 1530   nicotine (NICODERM CQ - dosed in mg/24 hours) patch 14 mg  14 mg Transdermal Daily Godfrey Pick, MD   14 mg at 11/07/21 1115   sertraline (ZOLOFT) tablet 200 mg  200 mg Oral Daily Lindon Romp A, NP   200 mg at 11/09/21 1144   tamsulosin (FLOMAX) capsule 0.4 mg  0.4 mg Oral Daily Milton Ferguson, MD   0.4 mg at 11/09/21 1147   Current Outpatient Medications  Medication Sig Dispense Refill   ARIPIPRAZOLE IM Inject into the muscle every 30 (thirty) days.  atenolol (TENORMIN) 25 MG tablet Take 25 mg by mouth at bedtime.     cloZAPine (CLOZARIL) 100 MG tablet Take 300 mg by mouth at bedtime.     ibuprofen (ADVIL) 200 MG tablet Take 1 tablet (200 mg total) by mouth every 6 (six) hours as needed for fever, headache or moderate pain. 30 tablet 0   LORazepam (ATIVAN) 0.5 MG tablet Take 1.5 mg by mouth 2 (two) times daily.     LORazepam (ATIVAN) 2 MG tablet Take 2 mg by mouth daily.     nystatin cream (MYCOSTATIN) Apply topically 2 (two) times daily. 30 g 0   polyethylene glycol (MIRALAX / GLYCOLAX) 17 g packet Take 17 g by mouth 2  (two) times daily. 14 each 0   pravastatin (PRAVACHOL) 40 MG tablet Take 40 mg by mouth daily.     senna (SENOKOT) 8.6 MG TABS tablet Take 4 tablets by mouth 2 (two) times daily.     senna-docusate (SENOKOT-S) 8.6-50 MG tablet Take 2 tablets by mouth 2 (two) times daily. 60 tablet 0   sertraline (ZOLOFT) 100 MG tablet Take 200 mg by mouth daily.     tamsulosin (FLOMAX) 0.4 MG CAPS capsule Take 0.4 mg by mouth daily.     chlorhexidine (PERIDEX) 0.12 % solution Use as directed 15 mLs in the mouth or throat 2 (two) times daily. (Patient not taking: Reported on 10/23/2021) 120 mL 0   clonazePAM (KLONOPIN) 0.5 MG tablet Take 0.5 tablets (0.25 mg total) by mouth 3 (three) times daily as needed (anxiety). (Patient not taking: Reported on 10/23/2021) 30 tablet 0   escitalopram (LEXAPRO) 10 MG tablet Take 1 tablet (10 mg total) by mouth daily. (Patient not taking: Reported on 10/23/2021) 30 tablet 0   traZODone (DESYREL) 50 MG tablet Take 1 tablet (50 mg total) by mouth at bedtime. (Patient not taking: Reported on 10/23/2021) 30 tablet 0   ziprasidone (GEODON) 40 MG capsule Take 1 capsule (40 mg total) by mouth daily at 8 pm. (Patient not taking: Reported on 10/23/2021) 30 capsule 0   ziprasidone (GEODON) 60 MG capsule Take 1 capsule (60 mg total) by mouth daily at 8 pm. (Patient not taking: Reported on 10/23/2021) 30 capsule 0     Discharge Medications: Please see discharge summary for a list of discharge medications.  Relevant Imaging Results:  Relevant Lab Results:   Additional Information SSN: [id number]  Joaquin Courts, RN

## 2021-11-09 NOTE — ED Provider Notes (Signed)
Emergency Medicine Observation Re-evaluation Note  Peter Parsons is a 61 y.o. male, seen on rounds today.  Pt initially presented to the ED for complaints of Psychiatric Evaluation and Anxiety Currently, the patient is awaiting social work placement.  Physical Exam  BP 119/74 (BP Location: Right Arm)   Pulse 70   Temp 97.8 F (36.6 C) (Axillary)   Resp 16   Ht '5\' 10"'$  (1.778 m)   Wt 89.8 kg   SpO2 98%   BMI 28.41 kg/m  Physical Exam No acute distress  ED Course / MDM  EKG:EKG Interpretation  Date/Time:  Wednesday October 24 2021 05:46:01 EDT Ventricular Rate:  74 PR Interval:  136 QRS Duration: 144 QT Interval:  408 QTC Calculation: 453 R Axis:   -75 Text Interpretation: Sinus rhythm RBBB and LAFB When compared with ECG of 05/14/2019, Right bundle branch block is now present Confirmed by Delora Fuel ([id number]) on 10/25/2021 12:29:06 AM  I have reviewed the labs performed to date as well as medications administered while in observation.  Recent changes in the last 24 hours include none.  Plan  Current plan is for placement by social worker.    Milton Ferguson, MD 11/09/21 0830

## 2021-11-09 NOTE — ED Notes (Signed)
Patient was given his dinner tray.

## 2021-11-09 NOTE — ED Notes (Signed)
Patient has been alert this shift. Patient  medication compliant.  Patient anxious at times, reassured.  Cooperative with care.

## 2021-11-10 NOTE — ED Provider Notes (Signed)
Emergency Medicine Observation Re-evaluation Note  Peter Parsons is a 61 y.o. male, seen on rounds today.  Pt initially presented to the ED for complaints of Psychiatric Evaluation and Anxiety Currently, the patient is sleeping.  Physical Exam  BP 127/71 (BP Location: Left Arm)   Pulse 71   Temp (!) 97.4 F (36.3 C) (Oral)   Resp 18   Ht '5\' 10"'$  (1.778 m)   Wt 89.8 kg   SpO2 96%   BMI 28.41 kg/m  Physical Exam General: Sleeping Cardiac: Extremities well-perfused Lungs: Breathing is unlabored Psych: Deferred  ED Course / MDM  EKG:EKG Interpretation  Date/Time:  Wednesday October 24 2021 05:46:01 EDT Ventricular Rate:  74 PR Interval:  136 QRS Duration: 144 QT Interval:  408 QTC Calculation: 453 R Axis:   -75 Text Interpretation: Sinus rhythm RBBB and LAFB When compared with ECG of 05/14/2019, Right bundle branch block is now present Confirmed by Delora Fuel ([id number]) on 10/25/2021 12:29:06 AM  I have reviewed the labs performed to date as well as medications administered while in observation.  Recent changes in the last 24 hours include none.  Plan  Current plan is for placement.    Godfrey Pick, MD 11/10/21 [phone]

## 2021-11-11 NOTE — ED Provider Notes (Signed)
  Physical Exam  BP 111/71 (BP Location: Left Arm)   Pulse 79   Temp 98.5 F (36.9 C) (Axillary)   Resp 18   Ht '5\' 10"'$  (1.778 m)   Wt 89.8 kg   SpO2 97%   BMI 28.41 kg/m   Physical Exam  Procedures  Procedures  ED Course / MDM    Medical Decision Making Risk OTC drugs. Prescription drug management.   Has been here for 450 hours.  Still pending placement.  No issues since yesterday.       Davonna Belling, MD 11/11/21 1425

## 2021-11-11 NOTE — ED Notes (Signed)
Lunch tray given. 

## 2021-11-11 NOTE — ED Notes (Signed)
Breakfast tray given. °

## 2021-11-11 NOTE — ED Notes (Signed)
Pt in shower full linen changed

## 2021-11-12 NOTE — ED Provider Notes (Signed)
Emergency Medicine Observation Re-evaluation Note  Peter Parsons is a 61 y.o. male, seen on rounds today.  Pt initially presented to the ED for complaints of Psychiatric Evaluation and Anxiety Currently, the patient is sleeping.  Physical Exam  BP 126/74 (BP Location: Left Arm)   Pulse 85   Temp 98.4 F (36.9 C) (Oral)   Resp 18   Ht '5\' 10"'$  (1.778 m)   Wt 89.8 kg   SpO2 98%   BMI 28.41 kg/m  Physical Exam General: Sleeping Cardiac: Extremities well-perfused Lungs: Breathing is unlabored Psych: Deferred  ED Course / MDM  EKG:EKG Interpretation  Date/Time:  Wednesday October 24 2021 05:46:01 EDT Ventricular Rate:  74 PR Interval:  136 QRS Duration: 144 QT Interval:  408 QTC Calculation: 453 R Axis:   -75 Text Interpretation: Sinus rhythm RBBB and LAFB When compared with ECG of 05/14/2019, Right bundle branch block is now present Confirmed by Delora Fuel ([id number]) on 10/25/2021 12:29:06 AM  I have reviewed the labs performed to date as well as medications administered while in observation.  Recent changes in the last 24 hours include none.  Plan  Current plan is for placement.    Godfrey Pick, MD 11/12/21 [phone]

## 2021-11-12 NOTE — ED Notes (Signed)
Patient alert this shift.  Patient mediation compliant this shift. Patient cooperative with redirection. Patient restless,  anxious and worried during shift., patient reassured.  No aggression or agitation noted this shift.

## 2021-11-13 NOTE — Progress Notes (Addendum)
This CSW attempted to contact the pt's legal guardian, [name], and supervisor, [name],  but was unsuccessful. Left HIPAA Compliant voicemail.

## 2021-11-13 NOTE — ED Provider Notes (Signed)
Emergency Medicine Observation Re-evaluation Note  Peter Parsons is a 61 y.o. male, seen on rounds today.  Pt initially presented to the ED for complaints of Psychiatric Evaluation and Anxiety Currently, the patient is resting.  Physical Exam  BP 139/88 (BP Location: Left Arm)   Pulse (!) 109   Temp 98.3 F (36.8 C) (Oral)   Resp 20   Ht '5\' 10"'$  (1.778 m)   Wt 89.8 kg   SpO2 99%   BMI 28.41 kg/m  Physical Exam General: NAD Cardiac: well perfused Lungs: even and unlabored Psych: no agitation  ED Course / MDM  EKG:EKG Interpretation  Date/Time:  Wednesday October 24 2021 05:46:01 EDT Ventricular Rate:  74 PR Interval:  136 QRS Duration: 144 QT Interval:  408 QTC Calculation: 453 R Axis:   -75 Text Interpretation: Sinus rhythm RBBB and LAFB When compared with ECG of 05/14/2019, Right bundle branch block is now present Confirmed by Delora Fuel ([id number]) on 10/25/2021 12:29:06 AM  I have reviewed the labs performed to date as well as medications administered while in observation.  Recent changes in the last 24 hours include none. Pt has been in the ED for close to 500 hours, awaiting placement  Plan  Current plan is for social work coordinating placement.    Regan Lemming, MD 11/13/21 [phone]

## 2021-11-14 MED ORDER — ARIPIPRAZOLE ER 400 MG IM SRER
400.0000 mg | Freq: Once | INTRAMUSCULAR | Status: AC
  Administered 2021-11-14: 400 mg via INTRAMUSCULAR
  Filled 2021-11-14: qty 2

## 2021-11-14 NOTE — Progress Notes (Signed)
TOC has provided the following facilities with a referral: [facility]: Will contact Legal Guardian for more information. [facility]: Declined. [facility]: Emailed referral.

## 2021-11-14 NOTE — ED Provider Notes (Signed)
Emergency Medicine Observation Re-evaluation Note  Jada Kuhnert is a 61 y.o. male, seen on rounds today.  Pt initially presented to the ED for complaints of Psychiatric Evaluation and Anxiety Currently, the patient is sleeping.  Physical Exam  BP 115/71 (BP Location: Left Arm)   Pulse 75   Temp 97.6 F (36.4 C) (Oral)   Resp 15   Ht '5\' 10"'$  (1.778 m)   Wt 89.8 kg   SpO2 97%   BMI 28.41 kg/m  Physical Exam General: Sleeping Cardiac: Regular rate Lungs: Clear   ED Course / MDM  EKG:EKG Interpretation  Date/Time:  Wednesday October 24 2021 05:46:01 EDT Ventricular Rate:  74 PR Interval:  136 QRS Duration: 144 QT Interval:  408 QTC Calculation: 453 R Axis:   -75 Text Interpretation: Sinus rhythm RBBB and LAFB When compared with ECG of 05/14/2019, Right bundle branch block is now present Confirmed by Delora Fuel ([id number]) on 10/25/2021 12:29:06 AM  I have reviewed the labs performed to date as well as medications administered while in observation.  Recent changes in the last 24 hours include none.  Plan  Current plan is for placement.  No specific location has been secured at this time.  Patient is very remorseful for choices he made in the past and is desiring to go back to his prior group home    Blanchie Dessert, MD 11/14/21 [phone]

## 2021-11-15 LAB — CBC WITH DIFFERENTIAL/PLATELET
Abs Immature Granulocytes: 0.02 10*3/uL (ref 0.00–0.07)
Basophils Absolute: 0 10*3/uL (ref 0.0–0.1)
Basophils Relative: 1 %
Eosinophils Absolute: 0 10*3/uL (ref 0.0–0.5)
Eosinophils Relative: 0 %
HCT: 41.3 % (ref 39.0–52.0)
Hemoglobin: 14.3 g/dL (ref 13.0–17.0)
Immature Granulocytes: 0 %
Lymphocytes Relative: 40 %
Lymphs Abs: 2.4 10*3/uL (ref 0.7–4.0)
MCH: 30.8 pg (ref 26.0–34.0)
MCHC: 34.6 g/dL (ref 30.0–36.0)
MCV: 88.8 fL (ref 80.0–100.0)
Monocytes Absolute: 0.5 10*3/uL (ref 0.1–1.0)
Monocytes Relative: 8 %
Neutro Abs: 3 10*3/uL (ref 1.7–7.7)
Neutrophils Relative %: 51 %
Platelets: 127 10*3/uL — ABNORMAL LOW (ref 150–400)
RBC: 4.65 MIL/uL (ref 4.22–5.81)
RDW: 13.6 % (ref 11.5–15.5)
WBC: 5.9 10*3/uL (ref 4.0–10.5)
nRBC: 0 % (ref 0.0–0.2)

## 2021-11-15 NOTE — Progress Notes (Signed)
Pharmacy - Clozapine     This patient's order has been reviewed for prescribing contraindications.   Labs:  ANC 3000 10/5  The medication is being dispensed pursuant to the FDA REMS suspension order of 12/31/19 that allows for dispensing without a patient REMS dispense authorization (RDA). - weekly inpatient monitoring.   Eudelia Bunch, Pharm.D 11/15/2021 1:03 PM

## 2021-11-15 NOTE — ED Provider Notes (Signed)
Emergency Medicine Observation Re-evaluation Note  Peter Parsons is a 61 y.o. male, seen on rounds today.  Pt initially presented to the ED for complaints of Psychiatric Evaluation and Anxiety Currently, the patient is waiting for placement.  Physical Exam  BP 115/70 (BP Location: Left Arm)   Pulse 75   Temp 97.7 F (36.5 C) (Oral)   Resp 18   Ht 1.778 m ('5\' 10"'$ )   Wt 89.8 kg   SpO2 96%   BMI 28.41 kg/m  Physical Exam General: No acute distress, sleeping Cardiac: Regular rate Lungs: Normal effort Psych:   ED Course / MDM  EKG:EKG Interpretation  Date/Time:  Wednesday October 24 2021 05:46:01 EDT Ventricular Rate:  74 PR Interval:  136 QRS Duration: 144 QT Interval:  408 QTC Calculation: 453 R Axis:   -75 Text Interpretation: Sinus rhythm RBBB and LAFB When compared with ECG of 05/14/2019, Right bundle branch block is now present Confirmed by Delora Fuel ([id number]) on 10/25/2021 12:29:06 AM  I have reviewed the labs performed to date as well as medications administered while in observation.  Recent changes in the last 24 hours include continued efforts at placement..  Plan  Current plan is for social work placement.  Patient not requiring inpatient psychiatric care per psychiatry.    Dorie Rank, MD 11/15/21 [phone]

## 2021-11-16 NOTE — Progress Notes (Signed)
TOC met with [facility], Larene Beach, for the pt's weekly Care meeting. Per [facility] has contatced a total of 27 providers. None have formally accepted the pt. Larene Beach shared that a couple providers would like to speak with [name], Legal Guardian.   TOC has requested an update from [facility]- awaiting response. TOC following.

## 2021-11-16 NOTE — ED Notes (Signed)
Patient alert this shift.  Patient medication compliant.  No aggression or agitation noted during shift. Patient anxious and worried, patient redirected and reassured by staff.  Patient ambulatory and can complete ADLs.

## 2021-11-16 NOTE — ED Provider Notes (Signed)
Emergency Medicine Observation Re-evaluation Note  Peter Parsons is a 61 y.o. male, seen on rounds today.  Pt initially presented to the ED for complaints of Psychiatric Evaluation and Anxiety Currently, the patient is placement for nursing home  Physical Exam  BP (!) 149/79 (BP Location: Left Arm)   Pulse (!) 107   Temp 98.3 F (36.8 C) (Oral)   Resp 18   Ht '5\' 10"'$  (1.778 m)   Wt 89.8 kg   SpO2 98%   BMI 28.41 kg/m  Physical Exam Resting in no acute distress  ED Course / MDM  EKG:EKG Interpretation  Date/Time:  Wednesday October 24 2021 05:46:01 EDT Ventricular Rate:  74 PR Interval:  136 QRS Duration: 144 QT Interval:  408 QTC Calculation: 453 R Axis:   -75 Text Interpretation: Sinus rhythm RBBB and LAFB When compared with ECG of 05/14/2019, Right bundle branch block is now present Confirmed by Delora Fuel ([id number]) on 10/25/2021 12:29:06 AM  I have reviewed the labs performed to date as well as medications administered while in observation.  Recent changes in the last 24 hours include none.  Plan  Current plan is for placement to nursing home.    Milton Ferguson, MD 11/16/21 [phone]

## 2021-11-17 NOTE — ED Provider Notes (Signed)
Emergency Medicine Observation Re-evaluation Note  Peter Parsons is a 61 y.o. male, seen on rounds today.  Pt initially presented to the ED for complaints of Psychiatric Evaluation and Anxiety Currently, the patient is sleeping.  Physical Exam  BP (!) 138/100   Pulse (!) 101   Temp 98 F (36.7 C)   Resp 16   Ht '5\' 10"'$  (1.778 m)   Wt 89.8 kg   SpO2 99%   BMI 28.41 kg/m  Physical Exam General: No acute distress Cardiac: Well-perfused Lungs: Nonlabored Psych: Cooperative  ED Course / MDM  EKG:EKG Interpretation  Date/Time:  Wednesday October 24 2021 05:46:01 EDT Ventricular Rate:  74 PR Interval:  136 QRS Duration: 144 QT Interval:  408 QTC Calculation: 453 R Axis:   -75 Text Interpretation: Sinus rhythm RBBB and LAFB When compared with ECG of 05/14/2019, Right bundle branch block is now present Confirmed by Delora Fuel ([id number]) on 10/25/2021 12:29:06 AM  I have reviewed the labs performed to date as well as medications administered while in observation.  Recent changes in the last 24 hours include social work working on placement.  Plan  Current plan is for placement.    Hayden Rasmussen, MD 11/17/21 [phone]

## 2021-11-18 NOTE — ED Provider Notes (Signed)
Emergency Medicine Observation Re-evaluation Note  Peter Parsons is a 61 y.o. male, seen on rounds today.  Pt initially presented to the ED for complaints of Psychiatric Evaluation and Anxiety Currently, the patient is sleeping.  Physical Exam  BP 107/66 (BP Location: Left Arm)   Pulse 76   Temp 98.4 F (36.9 C) (Axillary)   Resp 16   Ht '5\' 10"'$  (1.778 m)   Wt 89.8 kg   SpO2 96%   BMI 28.41 kg/m  Physical Exam General: No acute distress Cardiac: Well-perfused Lungs: Nonlabored Psych: Cooperative  ED Course / MDM  EKG:EKG Interpretation  Date/Time:  Wednesday October 24 2021 05:46:01 EDT Ventricular Rate:  74 PR Interval:  136 QRS Duration: 144 QT Interval:  408 QTC Calculation: 453 R Axis:   -75 Text Interpretation: Sinus rhythm RBBB and LAFB When compared with ECG of 05/14/2019, Right bundle branch block is now present Confirmed by Delora Fuel ([id number]) on 10/25/2021 12:29:06 AM  I have reviewed the labs performed to date as well as medications administered while in observation.  Recent changes in the last 24 hours include social work continuing to work on placement.  Plan  Current plan is for placement.    Hayden Rasmussen, MD 11/18/21 [phone]

## 2021-11-19 NOTE — ED Notes (Signed)
Patient is still paranoid. He feels " scared all the time". Cooperative with his meds. Had and snack and went to sleep.

## 2021-11-19 NOTE — ED Provider Notes (Signed)
Emergency Medicine Observation Re-evaluation Note  Peter Parsons is a 61 y.o. male, seen on rounds today.  Pt initially presented to the ED for complaints of Psychiatric Evaluation and Anxiety Currently, the patient is sleeping.  Physical Exam  BP (!) 136/90 (BP Location: Left Arm)   Pulse 100   Temp 97.8 F (36.6 C) (Oral)   Resp 18   Ht '5\' 10"'$  (1.778 m)   Wt 89.8 kg   SpO2 93%   BMI 28.41 kg/m  Physical Exam General: asleep Cardiac: deferred, asleep Lungs: deferred, asleep Psych: deferred, asleep  ED Course / MDM  EKG:EKG Interpretation  Date/Time:  Wednesday October 24 2021 05:46:01 EDT Ventricular Rate:  74 PR Interval:  136 QRS Duration: 144 QT Interval:  408 QTC Calculation: 453 R Axis:   -75 Text Interpretation: Sinus rhythm RBBB and LAFB When compared with ECG of 05/14/2019, Right bundle branch block is now present Confirmed by Delora Fuel ([id number]) on 10/25/2021 12:29:06 AM  I have reviewed the labs performed to date as well as medications administered while in observation.  No recent changes in the last 24 hours.  Plan  Current plan is for Placement.    Sherwood Gambler, MD 11/19/21 [phone]

## 2021-11-19 NOTE — Progress Notes (Signed)
This CSW attempted to contact Peter Parsons, to inquire about an update on him speaking with providers. TOC following.

## 2021-11-20 NOTE — ED Provider Notes (Signed)
Emergency Medicine Observation Re-evaluation Note  Peter Parsons is a 61 y.o. male, seen on rounds today.  Pt initially presented to the ED for complaints of Psychiatric Evaluation and Anxiety Currently, the patient is asleep.  Physical Exam  BP 134/84 (BP Location: Left Arm)   Pulse (!) 101   Temp 98.3 F (36.8 C) (Oral)   Resp 18   Ht '5\' 10"'$  (1.778 m)   Wt 89.8 kg   SpO2 97%   BMI 28.41 kg/m  Physical Exam General: no acute distress Cardiac: regular rate Lungs: no respiratory distress Psych: calm  ED Course / MDM  EKG:EKG Interpretation  Date/Time:  Wednesday October 24 2021 05:46:01 EDT Ventricular Rate:  74 PR Interval:  136 QRS Duration: 144 QT Interval:  408 QTC Calculation: 453 R Axis:   -75 Text Interpretation: Sinus rhythm RBBB and LAFB When compared with ECG of 05/14/2019, Right bundle branch block is now present Confirmed by Delora Fuel ([id number]) on 10/25/2021 12:29:06 AM  I have reviewed the labs performed to date as well as medications administered while in observation.  Recent changes in the last 24 hours include none.  Plan  Current plan is for placement.    Cristie Hem, MD 11/20/21 [phone]

## 2021-11-21 NOTE — ED Notes (Signed)
Pt given a snack 

## 2021-11-21 NOTE — Progress Notes (Signed)
This CSW has attempted to contact legal guardian, [name], without success. Left HIPAA Compliant voicemail.

## 2021-11-21 NOTE — ED Notes (Signed)
Pt's breakfast has arrived, placed onto bedside table

## 2021-11-21 NOTE — ED Provider Notes (Signed)
Emergency Medicine Observation Re-evaluation Note  Peter Parsons is a 61 y.o. male, seen on rounds today.  Pt initially presented to the ED for complaints of Psychiatric Evaluation and Anxiety Currently, the patient is resting.  Physical Exam  BP 115/66 (BP Location: Left Arm)   Pulse 71   Temp 97.9 F (36.6 C) (Oral)   Resp 16   Ht 1.778 m ('5\' 10"'$ )   Wt 89.8 kg   SpO2 98%   BMI 28.41 kg/m  Physical Exam General: No acute distress  ED Course / MDM  EKG:EKG Interpretation  Date/Time:  Wednesday October 24 2021 05:46:01 EDT Ventricular Rate:  74 PR Interval:  136 QRS Duration: 144 QT Interval:  408 QTC Calculation: 453 R Axis:   -75 Text Interpretation: Sinus rhythm RBBB and LAFB When compared with ECG of 05/14/2019, Right bundle branch block is now present Confirmed by Delora Fuel ([id number]) on 10/25/2021 12:29:06 AM  I have reviewed the labs performed to date as well as medications administered while in observation.  Recent changes in the last 24 hours include nothing.  Plan  Current plan is for placement.    Lacretia Leigh, MD 11/21/21 [phone]

## 2021-11-21 NOTE — Progress Notes (Signed)
CSW left a message with Legal Guardian as well with the supervisor from [facility]. TOC will update when updates come.

## 2021-11-22 LAB — CBC WITH DIFFERENTIAL/PLATELET
Abs Immature Granulocytes: 0 10*3/uL (ref 0.00–0.07)
Basophils Absolute: 0 10*3/uL (ref 0.0–0.1)
Basophils Relative: 1 %
Eosinophils Absolute: 0 10*3/uL (ref 0.0–0.5)
Eosinophils Relative: 0 %
HCT: 40.4 % (ref 39.0–52.0)
Hemoglobin: 14.3 g/dL (ref 13.0–17.0)
Immature Granulocytes: 0 %
Lymphocytes Relative: 47 %
Lymphs Abs: 2.1 10*3/uL (ref 0.7–4.0)
MCH: 31.2 pg (ref 26.0–34.0)
MCHC: 35.4 g/dL (ref 30.0–36.0)
MCV: 88 fL (ref 80.0–100.0)
Monocytes Absolute: 0.4 10*3/uL (ref 0.1–1.0)
Monocytes Relative: 9 %
Neutro Abs: 1.9 10*3/uL (ref 1.7–7.7)
Neutrophils Relative %: 43 %
Platelets: 123 10*3/uL — ABNORMAL LOW (ref 150–400)
RBC: 4.59 MIL/uL (ref 4.22–5.81)
RDW: 13.8 % (ref 11.5–15.5)
WBC: 4.4 10*3/uL (ref 4.0–10.5)
nRBC: 0 % (ref 0.0–0.2)

## 2021-11-22 NOTE — Progress Notes (Signed)
Pharmacy - Clozapine     This patient's order has been reviewed for prescribing contraindications.   Labs:  ANC 1.9 K/microL  on 11/22/2021  The medication is being dispensed pursuant to the FDA REMS suspension order of 12/31/19 that allows for dispensing without a patient REMS dispense authorization (RDA).    Dia Sitter, PharmD, BCPS 11/22/2021 11:17 AM

## 2021-11-22 NOTE — ED Provider Notes (Signed)
Emergency Medicine Observation Re-evaluation Note  Peter Parsons is a 61 y.o. male, seen on rounds today.  Pt initially presented to the ED for complaints of Psychiatric Evaluation and Anxiety Currently, the patient is stable in no acute distress.  He is resting comfortably this morning.Marland Kitchen  Physical Exam  BP 114/76 (BP Location: Left Arm)   Pulse 66   Temp 98.5 F (36.9 C) (Oral)   Resp 18   Ht '5\' 10"'$  (1.778 m)   Wt 89.8 kg   SpO2 98%   BMI 28.41 kg/m  Physical Exam General: Appears to be resting comfortably in bed, no acute distress. Cardiac: Regular rate, normal heart rate, non-emergent blood pressure for this morning's vitals. Lungs: No increased work of breathing.  Equal chest rise appreciated Psych: Calm, asleep in bed.   ED Course / MDM  EKG:EKG Interpretation  Date/Time:  Wednesday October 24 2021 05:46:01 EDT Ventricular Rate:  74 PR Interval:  136 QRS Duration: 144 QT Interval:  408 QTC Calculation: 453 R Axis:   -75 Text Interpretation: Sinus rhythm RBBB and LAFB When compared with ECG of 05/14/2019, Right bundle branch block is now present Confirmed by Delora Fuel ([id number]) on 10/25/2021 12:29:06 AM  I have reviewed the labs performed to date as well as medications administered while in observation.    Plan  Current plan is for social work placement.  Social work following.    Tretha Sciara, MD 11/22/21 [phone]

## 2021-11-22 NOTE — Progress Notes (Signed)
This CSW sent progress notes to Ms. Works with [facility] out of [place], [place]. Ms. Works is also requesting the pt's ISP - which would need to be sent from [place]. This CSW informed Bl[employer], and Care Coordinator, Larene Beach of Ms. Works' request. TOC following.   Ms. Works' contact information: Phone: [phone] Email: [email or web address]

## 2021-11-22 NOTE — Progress Notes (Addendum)
Care Meeting Notes:  Larene Beach, CC at [facility] will be coming to visit the pt on tomorrow (10/13).  [facility], in addition to [facility], has also had difficulties contacting Legal Guardian. Legal Guardian was not present for Care Meeting. TOC and [facility] has agreed to address lack of communication concerns by [place] with leadership as both parties feel that the unresponsiveness may potentially be causing delays in placement.   [facility], in addition to [facility], is continuing to follow up with providers on referrals sent out.   TOC supervisor, Beverlee Nims, posed the idea of [facility] providing 1 on 1 peer support to be set up for the pt once placement is received. Larene Beach will speak to her leadership to confirm.

## 2021-11-23 NOTE — ED Provider Notes (Signed)
Emergency Medicine Observation Re-evaluation Note  Peter Parsons is a 61 y.o. male, seen on rounds today.  Pt initially presented to the ED for complaints of Psychiatric Evaluation and Anxiety Currently, the patient is resting.  Physical Exam  BP 134/76 (BP Location: Left Arm)   Pulse 78   Temp 98.4 F (36.9 C) (Oral)   Resp 18   Ht '5\' 10"'$  (1.778 m)   Wt 89.8 kg   SpO2 97%   BMI 28.41 kg/m  Physical Exam General: nad Lungs: no respiratory distress   ED Course / MDM  EKG:EKG Interpretation  Date/Time:  Wednesday October 24 2021 05:46:01 EDT Ventricular Rate:  74 PR Interval:  136 QRS Duration: 144 QT Interval:  408 QTC Calculation: 453 R Axis:   -75 Text Interpretation: Sinus rhythm RBBB and LAFB When compared with ECG of 05/14/2019, Right bundle branch block is now present Confirmed by Delora Fuel ([id number]) on 10/25/2021 12:29:06 AM  I have reviewed the labs performed to date as well as medications administered while in observation.  Recent changes in the last 24 hours include none.  Plan  Current plan is for placement.    Wynona Dove A, DO 11/23/21 1058

## 2021-11-23 NOTE — ED Notes (Signed)
Cup of meds spilled by patient on floor. Wasted Ativan in [place] with another Therapist, sports. Meds pulled again and given.

## 2021-11-24 NOTE — ED Notes (Signed)
Patient has been alert this shift.  Cooperative with care. Patient medication compliant.confusion noted.  No aggression or agitation noted.

## 2021-11-25 NOTE — ED Provider Notes (Signed)
Emergency Medicine Observation Re-evaluation Note  Peter Parsons is a 61 y.o. male, seen on rounds today.  Pt initially presented to the ED for complaints of Psychiatric Evaluation and Anxiety Currently, the patient is sleeping comfortably.  Physical Exam  BP 121/61 (BP Location: Left Arm)   Pulse 72   Temp 97.8 F (36.6 C) (Oral)   Resp 18   Ht '5\' 10"'$  (1.778 m)   Wt 89.8 kg   SpO2 97%   BMI 28.41 kg/m  Physical Exam General: Resting comfortably Cardiac: Regular rate Lungs: Breathing comfortably Psych: Calm not agitated  ED Course / MDM  EKG:EKG Interpretation  Date/Time:  Wednesday October 24 2021 05:46:01 EDT Ventricular Rate:  74 PR Interval:  136 QRS Duration: 144 QT Interval:  408 QTC Calculation: 453 R Axis:   -75 Text Interpretation: Sinus rhythm RBBB and LAFB When compared with ECG of 05/14/2019, Right bundle branch block is now present Confirmed by Delora Fuel ([id number]) on 10/25/2021 12:29:06 AM  I have reviewed the labs performed to date as well as medications administered while in observation.  Recent changes in the last 24 hours include none.  Plan  Current plan is for placement.    Elgie Congo, MD 11/25/21 [phone]

## 2021-11-25 NOTE — ED Notes (Signed)
Patient was calm, cooperative, and pleasant this shift. He has been sleeping ever since he had his HS medications.

## 2021-11-26 NOTE — Progress Notes (Signed)
[name], Legal Guardian, completed intake packet and sent over psychological assessment to [facility] on Friday. TOC following.

## 2021-11-26 NOTE — ED Provider Notes (Signed)
Emergency Medicine Observation Re-evaluation Note  Peter Parsons is a 61 y.o. male, seen on rounds today.  Pt initially presented to the ED for complaints of Psychiatric Evaluation and Anxiety Currently, the patient is resting comfortably.  Physical Exam  BP (!) 128/90 (BP Location: Left Arm)   Pulse 98   Temp 99.1 F (37.3 C) (Oral)   Resp 18   Ht '5\' 10"'$  (1.778 m)   Wt 89.8 kg   SpO2 96%   BMI 28.41 kg/m  Physical Exam Vitals and nursing note reviewed.  Constitutional:      General: He is not in acute distress.    Appearance: He is well-developed.  HENT:     Head: Normocephalic and atraumatic.  Eyes:     Conjunctiva/sclera: Conjunctivae normal.  Cardiovascular:     Rate and Rhythm: Normal rate and regular rhythm.     Heart sounds: No murmur heard. Pulmonary:     Effort: Pulmonary effort is normal. No respiratory distress.  Musculoskeletal:        General: No swelling.     Cervical back: Neck supple.  Skin:    General: Skin is warm and dry.  Neurological:     Mental Status: He is alert.  Psychiatric:        Mood and Affect: Mood normal.      ED Course / MDM  EKG:EKG Interpretation  Date/Time:  Wednesday October 24 2021 05:46:01 EDT Ventricular Rate:  74 PR Interval:  136 QRS Duration: 144 QT Interval:  408 QTC Calculation: 453 R Axis:   -75 Text Interpretation: Sinus rhythm RBBB and LAFB When compared with ECG of 05/14/2019, Right bundle branch block is now present Confirmed by Delora Fuel ([id number]) on 10/25/2021 12:29:06 AM  I have reviewed the labs performed to date as well as medications administered while in observation.  Recent changes in the last 24 hours include none.  Plan  Current plan is for inpatient placement.    Teressa Lower, MD 11/26/21 1343

## 2021-11-26 NOTE — Discharge Instructions (Addendum)
Next dose of Abilify Maintena will be 1/6

## 2021-11-26 NOTE — ED Notes (Signed)
Pt slept from 2200 to end of shift. Pt called out 2 times over night asking for water but remained in his room and was appropriate with staff. Pt calm and cooperative with nursing care

## 2021-11-27 NOTE — ED Notes (Signed)
Patient alert this shift. Patient cooperative with care. Pleasant and cooperative. Patient appropriate with staff. Medication compliant.  No aggression or agitation noted.

## 2021-11-28 ENCOUNTER — Emergency Department (HOSPITAL_COMMUNITY): Payer: Medicaid Other

## 2021-11-28 MED ORDER — LEVOFLOXACIN 500 MG PO TABS
500.0000 mg | ORAL_TABLET | Freq: Every day | ORAL | Status: AC
  Administered 2021-11-29 – 2021-12-07 (×10): 500 mg via ORAL
  Filled 2021-11-28 (×11): qty 1

## 2021-11-28 NOTE — Social Work (Signed)
CSW attempted to call LG at 3:23 pm NA mailbox is currently full.

## 2021-11-28 NOTE — ED Provider Notes (Signed)
Called to examine patient complaining of left testicular swelling x 2 weeks.  He has diffuse swelling of his left testicle, vaguely tender, without overlying skin changes or abscess.  This could be consistent with epididymitis orchitis.  Patient will be started on levofloxacin 500 mg daily for 10 days.  Scrotal ultrasound ordered for tomorrow.  Of a low suspicion for torsion.   Wyvonnia Dusky, MD 11/28/21 [phone]

## 2021-11-28 NOTE — Progress Notes (Addendum)
This CSW attempted to contact [name], legal guardian, without success. Unable to leave a voicemail. Will follow up via email.   Addend @ 10:50 AM This CSW sent an email requesting updates on placements to [place], legal guardian, and also attached his supervisor, Verdis Prime. TOC following.

## 2021-11-29 LAB — CBC WITH DIFFERENTIAL/PLATELET
Abs Immature Granulocytes: 0.02 10*3/uL (ref 0.00–0.07)
Basophils Absolute: 0 10*3/uL (ref 0.0–0.1)
Basophils Relative: 0 %
Eosinophils Absolute: 0 10*3/uL (ref 0.0–0.5)
Eosinophils Relative: 0 %
HCT: 38.5 % — ABNORMAL LOW (ref 39.0–52.0)
Hemoglobin: 13.3 g/dL (ref 13.0–17.0)
Immature Granulocytes: 0 %
Lymphocytes Relative: 19 %
Lymphs Abs: 1.7 10*3/uL (ref 0.7–4.0)
MCH: 30.4 pg (ref 26.0–34.0)
MCHC: 34.5 g/dL (ref 30.0–36.0)
MCV: 87.9 fL (ref 80.0–100.0)
Monocytes Absolute: 0.8 10*3/uL (ref 0.1–1.0)
Monocytes Relative: 9 %
Neutro Abs: 6.2 10*3/uL (ref 1.7–7.7)
Neutrophils Relative %: 72 %
Platelets: 112 10*3/uL — ABNORMAL LOW (ref 150–400)
RBC: 4.38 MIL/uL (ref 4.22–5.81)
RDW: 13.2 % (ref 11.5–15.5)
WBC: 8.7 10*3/uL (ref 4.0–10.5)
nRBC: 0 % (ref 0.0–0.2)

## 2021-11-29 LAB — URINALYSIS, ROUTINE W REFLEX MICROSCOPIC
Bilirubin Urine: NEGATIVE
Glucose, UA: NEGATIVE mg/dL
Ketones, ur: NEGATIVE mg/dL
Nitrite: NEGATIVE
Protein, ur: 100 mg/dL — AB
RBC / HPF: >50 RBC/hpf — ABNORMAL HIGH (ref 0–5)
Specific Gravity, Urine: 1.013 (ref 1.005–1.030)
pH: 6 (ref 5.0–8.0)

## 2021-11-29 NOTE — ED Provider Notes (Signed)
Emergency Medicine Observation Re-evaluation Note  Peter Parsons is a 61 y.o. male, seen on rounds today.  Pt initially presented to the ED for complaints of Psychiatric Evaluation and Anxiety Currently, the patient is sleeping.  Physical Exam  BP 108/64 (BP Location: Left Arm)   Pulse 90   Temp 98.5 F (36.9 C) (Oral)   Resp 16   Ht '5\' 10"'$  (1.778 m)   Wt 89.8 kg   SpO2 92%   BMI 28.41 kg/m  Physical Exam General: Calm, sleeping Cardiac: Normal rate Lungs: No increased work of breathing Psych: No acute issues  ED Course / MDM  EKG:EKG Interpretation  Date/Time:  Wednesday October 24 2021 05:46:01 EDT Ventricular Rate:  74 PR Interval:  136 QRS Duration: 144 QT Interval:  408 QTC Calculation: 453 R Axis:   -75 Text Interpretation: Sinus rhythm RBBB and LAFB When compared with ECG of 05/14/2019, Right bundle branch block is now present Confirmed by Delora Fuel ([id number]) on 10/25/2021 12:29:06 AM  I have reviewed the labs performed to date as well as medications administered while in observation.  Recent changes in the last 24 hours include none.  Plan  Current plan is for nursing home placement, TOC is working on this.  Patient was seen yesterday and diagnosed with epididymitis.  Had a negative ultrasound for torsion but was consistent with epididymitis.  Has been started on Levaquin.Malvin Johns, MD 11/29/21 0800

## 2021-11-29 NOTE — NC FL2 (Signed)
[place] LEVEL OF CARE SCREENING TOOL     IDENTIFICATION  Patient Name: Peter Parsons Birthdate: 1960/03/22 Sex: male Admission Date (Current Location): 10/23/2021  [place] and [place] Number:  Peter Parsons [id number] [place] and Address:  [facility],  [place] [address] [place]      Provider Number: [id number]  Attending Physician Name and Address:  Default, Provider, MD  Relative Name and Phone Number:  Legal [name], [phone]    Current Level of Care: Hospital Recommended Level of Care: Other (Comment) (AFL) Prior Approval Number:    Date Approved/Denied:   PASRR Number:    Discharge Plan: Other (Comment) (AFL)    Current Diagnoses: Patient Active Problem List   Diagnosis Date Noted   Enterococcal bacteremia 05/15/2019   Hypertension 05/15/2019   Type 2 diabetes mellitus ([place]) 05/15/2019   Dyslipidemia 05/15/2019   BPH (benign prostatic hyperplasia) 05/15/2019   Normocytic anemia 05/15/2019   Thrombocytopenia ([place]) 05/15/2019   Acute urinary retention 05/15/2019   Urethral trauma 05/15/2019   UTI (urinary tract infection) 05/15/2019   Pressure injury of skin 58/52/7782   Acute metabolic encephalopathy 42/35/3614   Protein-calorie malnutrition, severe 03/29/2019   Weakness 03/27/2019   Dementia ([place]) 03/19/2019   Altered mental status    Paranoid schizophrenia ([place])     Orientation RESPIRATION BLADDER Height & Weight     Self, Time, Situation, Place  Normal Continent Weight: 89.8 kg Height:  '5\' 10"'$  (177.8 cm)  BEHAVIORAL SYMPTOMS/MOOD NEUROLOGICAL BOWEL NUTRITION STATUS      Continent Diet (Regular)  AMBULATORY STATUS COMMUNICATION OF NEEDS Skin   Independent Verbally Normal                       Personal Care Assistance Level of Assistance  Bathing, Feeding, Dressing Bathing Assistance: Independent Feeding assistance: Independent Dressing Assistance: Independent     Functional Limitations  Info  Sight, Hearing, Speech Sight Info: Adequate Hearing Info: Adequate Speech Info: Adequate    SPECIAL CARE FACTORS FREQUENCY                       Contractures Contractures Info: Not present    Additional Factors Info  Code Status, Allergies Code Status Info: Full Allergies Info: Acetaminophen Psychotropic Info: Clozaril, Ativan, Zoloft         Current Medications (11/29/2021):  This is the current hospital active medication list Current Facility-Administered Medications  Medication Dose Route Frequency Provider Last Rate Last Admin   cloZAPine (CLOZARIL) tablet 300 mg  300 mg Oral QHS Lindon Romp A, NP   300 mg at 11/28/21 2157   levofloxacin (LEVAQUIN) tablet 500 mg  500 mg Oral Daily Wyvonnia Dusky, MD   500 mg at 11/29/21 1127   LORazepam (ATIVAN) tablet 2 mg  2 mg Oral BID Lindon Romp A, NP   2 mg at 11/29/21 1128   LORazepam (ATIVAN) tablet 2 mg  2 mg Oral Daily PRN Sherwood Gambler, MD   2 mg at 11/26/21 1418   nicotine (NICODERM CQ - dosed in mg/24 hours) patch 14 mg  14 mg Transdermal Daily Godfrey Pick, MD   14 mg at 11/28/21 1020   sertraline (ZOLOFT) tablet 200 mg  200 mg Oral Daily Lindon Romp A, NP   200 mg at 11/29/21 1128   tamsulosin (FLOMAX) capsule 0.4 mg  0.4 mg Oral Daily Milton Ferguson, MD   0.4 mg at 11/29/21 1127   Current  Outpatient Medications  Medication Sig Dispense Refill   ARIPIPRAZOLE IM Inject into the muscle every 30 (thirty) days.     atenolol (TENORMIN) 25 MG tablet Take 25 mg by mouth at bedtime.     cloZAPine (CLOZARIL) 100 MG tablet Take 300 mg by mouth at bedtime.     ibuprofen (ADVIL) 200 MG tablet Take 1 tablet (200 mg total) by mouth every 6 (six) hours as needed for fever, headache or moderate pain. 30 tablet 0   LORazepam (ATIVAN) 0.5 MG tablet Take 1.5 mg by mouth 2 (two) times daily.     LORazepam (ATIVAN) 2 MG tablet Take 2 mg by mouth daily.     nystatin cream (MYCOSTATIN) Apply topically 2 (two) times daily. 30 g  0   polyethylene glycol (MIRALAX / GLYCOLAX) 17 g packet Take 17 g by mouth 2 (two) times daily. 14 each 0   pravastatin (PRAVACHOL) 40 MG tablet Take 40 mg by mouth daily.     senna (SENOKOT) 8.6 MG TABS tablet Take 4 tablets by mouth 2 (two) times daily.     senna-docusate (SENOKOT-S) 8.6-50 MG tablet Take 2 tablets by mouth 2 (two) times daily. 60 tablet 0   sertraline (ZOLOFT) 100 MG tablet Take 200 mg by mouth daily.     tamsulosin (FLOMAX) 0.4 MG CAPS capsule Take 0.4 mg by mouth daily.     chlorhexidine (PERIDEX) 0.12 % solution Use as directed 15 mLs in the mouth or throat 2 (two) times daily. (Patient not taking: Reported on 10/23/2021) 120 mL 0   clonazePAM (KLONOPIN) 0.5 MG tablet Take 0.5 tablets (0.25 mg total) by mouth 3 (three) times daily as needed (anxiety). (Patient not taking: Reported on 10/23/2021) 30 tablet 0   escitalopram (LEXAPRO) 10 MG tablet Take 1 tablet (10 mg total) by mouth daily. (Patient not taking: Reported on 10/23/2021) 30 tablet 0   traZODone (DESYREL) 50 MG tablet Take 1 tablet (50 mg total) by mouth at bedtime. (Patient not taking: Reported on 10/23/2021) 30 tablet 0   ziprasidone (GEODON) 40 MG capsule Take 1 capsule (40 mg total) by mouth daily at 8 pm. (Patient not taking: Reported on 10/23/2021) 30 capsule 0   ziprasidone (GEODON) 60 MG capsule Take 1 capsule (60 mg total) by mouth daily at 8 pm. (Patient not taking: Reported on 10/23/2021) 30 capsule 0     Discharge Medications: Please see discharge summary for a list of discharge medications.  Relevant Imaging Results:  Relevant Lab Results:   Additional Information SSN: [id number]  Joaquin Courts, RN

## 2021-11-29 NOTE — Progress Notes (Signed)
TOC supervisor, CSW, Cunningham Phillips, and LG [employer] met for a virtual care team meeting to discuss placement efforts.  Larene Beach reports she is continuing to work with [facility] on potential placement.  Larene Beach requested an updated FL2, FL2 completed and sent to CC.  LG [name] reports that if [facility] is able to accept patient, he would be in agreement with this placement.  [facility] has indicated they may be interested in a virtual interview with the patient, TOC notified the care team that we are able to assist with this request and requested a date an time that [facility] would like to complete this. TOC will continue to follow.

## 2021-11-29 NOTE — Progress Notes (Signed)
Pharmacy - Clozapine     This patient's order has been reviewed for prescribing contraindications.   Labs:  ANC 6.2 K/microL  on 11/29/2021  The medication is being dispensed pursuant to the FDA REMS suspension order of 12/31/19 that allows for dispensing without a patient REMS dispense authorization (RDA).    Adrian Saran, PharmD, BCPS Secure Chat if ?s 11/29/2021 11:02 AM

## 2021-11-29 NOTE — ED Notes (Signed)
Pt calm and cooperative with nursing care. Pt slept overnight.

## 2021-11-30 NOTE — ED Provider Notes (Signed)
Emergency Medicine Observation Re-evaluation Note  Peter Parsons is a 61 y.o. male, seen on rounds today.  Pt initially presented to the ED for complaints of Psychiatric Evaluation and Anxiety Currently, the patient is resting, nad.   Physical Exam  BP 108/64 (BP Location: Left Arm)   Pulse 90   Temp 98.5 F (36.9 C) (Oral)   Resp 16   Ht 1.778 m ('5\' 10"'$ )   Wt 89.8 kg   SpO2 92%   BMI 28.41 kg/m  Physical Exam General: resting, calm.  Cardiac: regular rate. Lungs: breathing comfortably. Psych: resting, calm.   ED Course / MDM    I have reviewed the labs performed to date as well as medications administered while in observation.  Recent changes in the last 24 hours include ED obs, reassessment.   Plan  TOC team continues to work on placement.       Lajean Saver, MD 11/30/21 [phone]

## 2021-12-01 LAB — URINE CULTURE: Culture: 10000 — AB

## 2021-12-01 NOTE — ED Provider Notes (Signed)
Emergency Medicine Observation Re-evaluation Note  Peter Parsons is a 61 y.o. male, seen on rounds today.  Pt initially presented to the ED for complaints of Psychiatric Evaluation and Anxiety Currently, the patient is resting.  Physical Exam  BP 131/75 (BP Location: Right Arm)   Pulse 67   Temp 97.7 F (36.5 C) (Oral)   Resp 20   Ht '5\' 10"'$  (1.778 m)   Wt 89.8 kg   SpO2 94%   BMI 28.41 kg/m  Physical Exam HENT:     Head: Normocephalic.  Neurological:     General: No focal deficit present.  Psychiatric:        Mood and Affect: Mood normal.     ED Course / MDM  EKG:EKG Interpretation  Date/Time:  Wednesday October 24 2021 05:46:01 EDT Ventricular Rate:  74 PR Interval:  136 QRS Duration: 144 QT Interval:  408 QTC Calculation: 453 R Axis:   -75 Text Interpretation: Sinus rhythm RBBB and LAFB When compared with ECG of 05/14/2019, Right bundle branch block is now present Confirmed by Delora Fuel ([id number]) on 10/25/2021 12:29:06 AM  I have reviewed the labs performed to date as well as medications administered while in observation.  Recent changes in the last 24 hours include nothing.  Plan  Current plan is for TOC placement.    Lennice Sites, DO 12/01/21 [phone]

## 2021-12-02 NOTE — ED Notes (Signed)
Bed linen removed and replaced.

## 2021-12-02 NOTE — ED Provider Notes (Signed)
Emergency Medicine Observation Re-evaluation Note  Peter Parsons is a 61 y.o. male, seen on rounds today.  Pt initially presented to the ED for complaints of Psychiatric Evaluation and Anxiety Currently, the patient is awaiting social work placement.  Physical Exam  BP 115/84 (BP Location: Left Arm)   Pulse (!) 103   Temp 98.1 F (36.7 C) (Oral)   Resp 20   Ht '5\' 10"'$  (1.778 m)   Wt 89.8 kg   SpO2 97%   BMI 28.41 kg/m  Physical Exam Alert no distress ED Course / MDM  EKG:EKG Interpretation  Date/Time:  Wednesday October 24 2021 05:46:01 EDT Ventricular Rate:  74 PR Interval:  136 QRS Duration: 144 QT Interval:  408 QTC Calculation: 453 R Axis:   -75 Text Interpretation: Sinus rhythm RBBB and LAFB When compared with ECG of 05/14/2019, Right bundle branch block is now present Confirmed by Delora Fuel ([id number]) on 10/25/2021 12:29:06 AM  I have reviewed the labs performed to date as well as medications administered while in observation.  Recent changes in the last 24 hours include none.  Plan  Current plan is for placement by social worker    Milton Ferguson, MD 12/02/21 [phone]

## 2021-12-02 NOTE — ED Notes (Signed)
Pt shower started, provided with change of scrubs, towels, body soap. Pt verbalizes understanding to call for assistance if needed.

## 2021-12-02 NOTE — ED Notes (Signed)
Pt repeatedly exits room (in intervals of 10-20 minutes) to verbalize to nurse and nurse-tech that he has a strong feeling of impending doom. Pt states that he feels like he is "going to die" and it is going to "happen soon." Informed pt to try watching television with the lights on, and that he is safe. Speaking to the pt seemed to help. Pt is attempting to "sleep it off."

## 2021-12-02 NOTE — ED Notes (Signed)
Pt continues to come out of room asking staff, "what have I done wrong?" Pt reassured that he is only here for his safety and that he is not in trouble. Pt redirected back to room.

## 2021-12-02 NOTE — ED Notes (Signed)
Pt exited room to repeat same verbalizations. After redirecting him to back to his room, worksheets were printed for pt to work on.

## 2021-12-03 NOTE — ED Provider Notes (Signed)
Emergency Medicine Observation Re-evaluation Note  Peter Parsons is a 62 y.o. male, seen on rounds today.  Pt initially presented to the ED for complaints of Psychiatric Evaluation and Anxiety Currently, the patient is awaiting social worker disposition.  Physical Exam  BP 116/88   Pulse 88   Temp 98.1 F (36.7 C) (Oral)   Resp 16   Ht '5\' 10"'$  (1.778 m)   Wt 89.8 kg   SpO2 98%   BMI 28.41 kg/m  Physical Exam General: Appears to be resting comfortably in bed, no acute distress. Cardiac: Regular rate, normal heart rate, non-emergent blood pressure for this morning's vitals. Lungs: No increased work of breathing.  Equal chest rise appreciated Psych: Calm, asleep in bed.  ED Course / MDM  EKG:EKG Interpretation  Date/Time:  Wednesday October 24 2021 05:46:01 EDT Ventricular Rate:  74 PR Interval:  136 QRS Duration: 144 QT Interval:  408 QTC Calculation: 453 R Axis:   -75 Text Interpretation: Sinus rhythm RBBB and LAFB When compared with ECG of 05/14/2019, Right bundle branch block is now present Confirmed by Delora Fuel ([id number]) on 10/25/2021 12:29:06 AM  I have reviewed the labs performed to date as well as medications administered while in observation.  Plan  Current plan is for social work placement, FL2 filed.     Tretha Sciara, MD 12/03/21 [phone]

## 2021-12-04 NOTE — ED Provider Notes (Signed)
Emergency Medicine Observation Re-evaluation Note  Peter Parsons is a 61 y.o. male, seen on rounds today.  Pt initially presented to the ED for complaints of Psychiatric Evaluation and Anxiety Currently, the patient is awaiting placement by social work.  Patient has no complaints.  Denies chest pain, shortness of breath denies swelling or pain to calves or lower legs   Physical Exam  BP 119/82 (BP Location: Right Arm)   Pulse 67   Temp 97.9 F (36.6 C) (Oral)   Resp 18   Ht '5\' 10"'$  (1.778 m)   Wt 89.8 kg   SpO2 96%   BMI 28.41 kg/m  Physical Exam General: Resting quietly no distress Cardiac: Regular no appreciable rub murmur gallop Lungs: Clear to auscultation respirations nonlabored Psych: Calm and cooperative musculo skeletal: No lower extremity edema.  Calves soft and nontender.  ED Course / MDM  EKG:EKG Interpretation  Date/Time:  Wednesday October 24 2021 05:46:01 EDT Ventricular Rate:  74 PR Interval:  136 QRS Duration: 144 QT Interval:  408 QTC Calculation: 453 R Axis:   -75 Text Interpretation: Sinus rhythm RBBB and LAFB When compared with ECG of 05/14/2019, Right bundle branch block is now present Confirmed by Delora Fuel ([id number]) on 10/25/2021 12:29:06 AM  I have reviewed the labs performed to date as well as medications administered while in observation.  Recent changes in the last 24 hours include none.  Vital signs stable  Plan  Current plan is for placement by social work.    Charlesetta Shanks, MD 12/04/21 [phone]

## 2021-12-04 NOTE — Progress Notes (Addendum)
[name], Legal Guardian, emailed this CSW to provide [name] staff's availability for a virtual meeting with the pt this week. This CSW informed [name] of my availability. This CSW has followed up with [name] and Ms. Banks with [name] about a date and time set for the virtual meeting. TOC following.   Addend @ 2:08 PM This CSW was contacted by Fransico Setters, staff at [facility] to inform that staff is available to meet at 12 noon tomorrow for virtual meeting. TOC following to receive link.

## 2021-12-04 NOTE — ED Notes (Signed)
Pt allowed to use phone at this time

## 2021-12-05 NOTE — ED Provider Notes (Signed)
Emergency Medicine Observation Re-evaluation Note  Peter Parsons is a 61 y.o. male, seen on rounds today.  Pt initially presented to the ED for complaints of hx of running away from group home placements - similar episode occurred prompting current ED stay. No new c/o this AM.   Physical Exam  BP 114/81 (BP Location: Right Arm)   Pulse 65   Temp 99.1 F (37.3 C) (Oral)   Resp 20   Ht 1.778 m ('5\' 10"'$ )   Wt 89.8 kg   SpO2 98%   BMI 28.41 kg/m  Physical Exam General: resting, calm. Cardiac: regular rate. Lungs: breathing comfortably. Psych: calm, resting.   ED Course / MDM    I have reviewed the labs performed to date as well as medications administered while in observation.  Recent changes in the last 24 hours include ED obs, reassessment.   Plan  Current plan is for TOC to facilitate placement.     Lajean Saver, MD 12/05/21 [phone]

## 2021-12-05 NOTE — Progress Notes (Signed)
Care meeting was at 12 PM - meeting went well and placement is still interested in Peter Parsons. Another care meeting will be held on 10/26 at 12 PM with TOC, LG, CC at [place], and staff at the home.

## 2021-12-05 NOTE — Progress Notes (Signed)
This CSW spoke with Peter Parsons to inform that he will have a virtual meet and greet with [name] today at noon. This CSW to inform RN via secure chat.

## 2021-12-06 LAB — CBC WITH DIFFERENTIAL/PLATELET
Abs Immature Granulocytes: 0.04 10*3/uL (ref 0.00–0.07)
Basophils Absolute: 0 10*3/uL (ref 0.0–0.1)
Basophils Relative: 1 %
Eosinophils Absolute: 0 10*3/uL (ref 0.0–0.5)
Eosinophils Relative: 0 %
HCT: 40.6 % (ref 39.0–52.0)
Hemoglobin: 14.1 g/dL (ref 13.0–17.0)
Immature Granulocytes: 1 %
Lymphocytes Relative: 29 %
Lymphs Abs: 1.8 10*3/uL (ref 0.7–4.0)
MCH: 30.1 pg (ref 26.0–34.0)
MCHC: 34.7 g/dL (ref 30.0–36.0)
MCV: 86.8 fL (ref 80.0–100.0)
Monocytes Absolute: 0.4 10*3/uL (ref 0.1–1.0)
Monocytes Relative: 7 %
Neutro Abs: 3.9 10*3/uL (ref 1.7–7.7)
Neutrophils Relative %: 62 %
Platelets: 159 10*3/uL (ref 150–400)
RBC: 4.68 MIL/uL (ref 4.22–5.81)
RDW: 12.9 % (ref 11.5–15.5)
WBC: 6.3 10*3/uL (ref 4.0–10.5)
nRBC: 0 % (ref 0.0–0.2)

## 2021-12-06 NOTE — Progress Notes (Signed)
Pharmacy - Clozapine     This patient's order has been reviewed for prescribing contraindications.   Labs:  ANC 3.9 K/microL  on 12/06/2021  The medication is being dispensed pursuant to the FDA REMS suspension order of 12/31/19 that allows for dispensing without a patient REMS dispense authorization (RDA).    Peggyann Juba, PharmD, BCPS Pharmacy: [phone] 12/06/2021 9:32 AM

## 2021-12-06 NOTE — Progress Notes (Signed)
Care meeting today with TOC, CC at [facility], and LG.   -LG and CC to meet with [name] staff to discuss funding for potential placement.  [facility] unable to accommodate a trial period - CC at [place] and [place] notified and will explore Respite option with [facility].   TOC following for more updates.

## 2021-12-06 NOTE — ED Provider Notes (Signed)
Emergency Medicine Observation Re-evaluation Note  Peter Parsons is a 61 y.o. male, seen on rounds today.  Pt initially presented to the ED for complaints of Psychiatric Evaluation and Anxiety Currently, the patient is calm.  Physical Exam  BP 100/64 (BP Location: Left Arm)   Pulse 68   Temp 97.9 F (36.6 C) (Oral)   Resp 18   Ht '5\' 10"'$  (1.778 m)   Wt 89.8 kg   SpO2 97%   BMI 28.41 kg/m  Physical Exam General: awake Cardiac: rr Lungs: clear Psych: calm  ED Course / MDM  EKG:EKG Interpretation  Date/Time:  Wednesday October 24 2021 05:46:01 EDT Ventricular Rate:  74 PR Interval:  136 QRS Duration: 144 QT Interval:  408 QTC Calculation: 453 R Axis:   -75 Text Interpretation: Sinus rhythm RBBB and LAFB When compared with ECG of 05/14/2019, Right bundle branch block is now present Confirmed by Delora Fuel ([id number]) on 10/25/2021 12:29:06 AM  I have reviewed the labs performed to date as well as medications administered while in observation.  Recent changes in the last 24 hours include none.  TOC attempting group home placement  Plan  Current plan is for group home placement.    Isla Pence, MD 12/06/21 [phone]

## 2021-12-07 NOTE — Progress Notes (Signed)
CSW attempted to call legal guardian NA

## 2021-12-07 NOTE — ED Notes (Signed)
Pt asking to use phone. Asked pt who he is wanting to call, pt stated the person that called me earlier. Explained to patient it was Education officer, museum and I will make them aware

## 2021-12-07 NOTE — Progress Notes (Addendum)
This CSW spoke with Peter Parsons to provide updates regarding potential placement. This CSW shared that [name] at [place] and [place], [place], met with [name] staff on yesterday regarding additional services available and plan for placement. TOC to follow up with them this morning.    Addend @ 8:54 AM This CSW is awaiting a response via email from [name], [name] at [name]. In the meantime, this CSW has requested that LG speaks with the pt to also inform of updates.   Addend @ 1:09 PM  This CSW spoke with the pt to inform the same information above. Per RN, the pt did not remember what we discussed this morning. Pt states understanding and apologized that he was sleepy when I spoke with him earlier today.

## 2021-12-07 NOTE — ED Provider Notes (Signed)
Emergency Medicine Observation Re-evaluation Note  Peter Parsons is a 61 y.o. male, seen on rounds today.  Pt initially presented to the ED for complaints of Psychiatric Evaluation and Anxiety Currently, the patient is calm.  Physical Exam  BP (!) 157/80 (BP Location: Left Arm)   Pulse 88   Temp 98.2 F (36.8 C) (Oral)   Resp 18   Ht '5\' 10"'$  (1.778 m)   Wt 89.8 kg   SpO2 93%   BMI 28.41 kg/m  Physical Exam General: awake and alert Cardiac: rr Lungs: clear Psych: calm  ED Course / MDM  EKG:EKG Interpretation  Date/Time:  Wednesday October 24 2021 05:46:01 EDT Ventricular Rate:  74 PR Interval:  136 QRS Duration: 144 QT Interval:  408 QTC Calculation: 453 R Axis:   -75 Text Interpretation: Sinus rhythm RBBB and LAFB When compared with ECG of 05/14/2019, Right bundle branch block is now present Confirmed by Delora Fuel ([id number]) on 10/25/2021 12:29:06 AM  I have reviewed the labs performed to date as well as medications administered while in observation.  Recent changes in the last 24 hours include none Plan  Current plan is for continue to await placement.    Isla Pence, MD 12/07/21 [phone]

## 2021-12-07 NOTE — ED Notes (Signed)
Patient is in good spirits BUT he seems to think he is leaving today. I the primary nurse was not told this by previous nurse. She also was confused. He has made comments such as " I'm leaving tomorrow" " Today is the big day" " They are going to take me to [place]".   I dont know what to tell patient maybe he has information I am not aware of.

## 2021-12-07 NOTE — ED Notes (Signed)
Pt lying in bed wide awake alert x3 denies pain or discomfort currently watching television. Affect brightens on approach mood stable.

## 2021-12-08 NOTE — Progress Notes (Signed)
CSW made several unsuccessful outreach call attempts today to patient's Legal Guardian with the [place], [name] [phone] or # [phone]).  HIPAA compliant messages left on voicemail.  CSW will await a return call.  Nat Christen, BSW, MSW, [employer]  Licensed Holiday representative  [employer]  Mailing Address-[address] [place], [place] [address] Physical Address-[address], [place], [place] [address] Toll Free Main # [phone] Fax # [phone] Cell # [phone] [email or web address]

## 2021-12-08 NOTE — ED Notes (Signed)
Peter Parsons is asleep with no signs of distress or sleep disturbance, respirations are easy skin color appropriate for his ethnicity.

## 2021-12-08 NOTE — ED Provider Notes (Signed)
Emergency Medicine Observation Re-evaluation Note  Peter Parsons is a 61 y.o. male, seen on rounds today.  Pt initially presented to the ED for complaints of behavioral symptoms, needing new ALF placement. No new c/o this AM.  Physical Exam  BP (!) 155/85   Pulse 82   Temp 98 F (36.7 C)   Resp 20   Ht 1.778 m ('5\' 10"'$ )   Wt 89.8 kg   SpO2 95%   BMI 28.41 kg/m  Physical Exam General: resting. Cardiac: regular rate. Lungs: breathing comfortably. Psych: calm, resting.   ED Course / MDM    I have reviewed the labs performed to date as well as medications administered while in observation.  Recent changes in the last 24 hours include ED obs, reassessment.   Plan  Current plan is for TOC placement into new ALF.   No new c/o this AM.    Lajean Saver, MD 12/08/21 [phone]

## 2021-12-08 NOTE — ED Notes (Signed)
Patient has been alert this shift. Patient cooperative with care and redirection.  Patient medication compliant. Patient can do all ADLs.

## 2021-12-09 NOTE — ED Notes (Signed)
Peter Parsons remains asleep with no signs of distress or disturbed sleep respirations are easy skin color is appropriate for his ethnicity.

## 2021-12-09 NOTE — ED Provider Notes (Signed)
Emergency Medicine Observation Re-evaluation Note  Peter Parsons is a 61 y.o. male, seen on rounds today.  Pt initially presented to the ED for complaints of Psychiatric Evaluation and Anxiety Currently, the patient is holding in the ED waiting for placement.  Patient has been here for 1119 hours.  Physical Exam  BP 124/85 (BP Location: Left Arm)   Pulse 81   Temp 98.1 F (36.7 C) (Oral)   Resp 20   Ht 1.778 m ('5\' 10"'$ )   Wt 89.8 kg   SpO2 96%   BMI 28.41 kg/m  Physical Exam General: Sleeping Cardiac: Regular rate Lungs: Breathing easily Psych:   ED Course / MDM  EKG:EKG Interpretation  Date/Time:  Wednesday October 24 2021 05:46:01 EDT Ventricular Rate:  74 PR Interval:  136 QRS Duration: 144 QT Interval:  408 QTC Calculation: 453 R Axis:   -75 Text Interpretation: Sinus rhythm RBBB and LAFB When compared with ECG of 05/14/2019, Right bundle branch block is now present Confirmed by Delora Fuel ([id number]) on 10/25/2021 12:29:06 AM  I have reviewed the labs performed to date as well as medications administered while in observation.  Recent changes in the last 24 hours include no acute changes overnight.  Plan  Current plan is for Placement by TOC.    Dorie Rank, MD 12/09/21 [phone]

## 2021-12-09 NOTE — ED Notes (Signed)
Peter Parsons is currently asleep with no signs of distress or broken sleep noted. His respirations appear easy and skin color is appropriate for his ethnicity.

## 2021-12-09 NOTE — ED Notes (Signed)
Peter Parsons is alert and oriented x4 behavior is self controlled affect is flat but brightens on approach mood stable and  he is cooperative with staff request.

## 2021-12-10 NOTE — Progress Notes (Addendum)
This CSW emailed [place] at [facility] and [name], legal guardian, to inquire about any new updates from [facility] housing department regarding funding and additional services.

## 2021-12-10 NOTE — ED Provider Notes (Signed)
Emergency Medicine Observation Re-evaluation Note  Peter Parsons is a 61 y.o. male, seen on rounds today.  Pt initially presented to the ED for complaints of Psychiatric Evaluation and Anxiety Currently, the patient is medically and psychiatrically cleared and awaiting new assisted living placement.   Today he is awake, writing poems.  Eating normally. Reports he is bored. No other concerns.  Physical Exam  BP 122/73 (BP Location: Left Arm)   Pulse 67   Temp 97.9 F (36.6 C) (Oral)   Resp 18   Ht '5\' 10"'$  (1.778 m)   Wt 89.8 kg   SpO2 94%   BMI 28.41 kg/m  Physical Exam General: awake, alert  Cardiac: regular rate Lungs: breathing comfortably Psych: calm, normal speech, normal affect  ED Course / MDM  EKG:EKG Interpretation  Date/Time:  Wednesday October 24 2021 05:46:01 EDT Ventricular Rate:  74 PR Interval:  136 QRS Duration: 144 QT Interval:  408 QTC Calculation: 453 R Axis:   -75 Text Interpretation: Sinus rhythm RBBB and LAFB When compared with ECG of 05/14/2019, Right bundle branch block is now present Confirmed by Delora Fuel ([id number]) on 10/25/2021 12:29:06 AM  I have reviewed the labs performed to date as well as medications administered while in observation.  Recent changes in the last 24 hours include none.  Plan  Current plan is for awaiting TOC placement.    Gareth Morgan, MD 12/11/21 [phone]

## 2021-12-11 NOTE — ED Notes (Signed)
Patient alert this shift .  Cooperative with care and redirection.  Patient medication compliant. Patient anxious and worries during shift.  Confusion noted.  No aggression or agitation noted. Patient can complete ADLs.

## 2021-12-11 NOTE — ED Notes (Signed)
Received patient asleep breathing spontaneously to room air .All safety measures maintained .

## 2021-12-11 NOTE — Progress Notes (Signed)
Leadership team at [facility] is currently presenting Peter Parsons' case to medical director to determine any special assistance funding that may be available. Supervisor at [facility] will provide updates to [facility] and LG as soon as possible. TOC following.

## 2021-12-12 NOTE — Progress Notes (Signed)
There has been no new updates from [place], [place], nor CC/Leadership at [facility] regarding special assistance funding as of 1:00 PM. Per Jason's email on 10/31, he requested to speak with Fransico Setters, staff at [facility], "to discuss specifics of potential placement." TOC following.

## 2021-12-12 NOTE — ED Provider Notes (Signed)
Emergency Medicine Observation Re-evaluation Note  Peter Parsons is a 61 y.o. male, seen on rounds today.  Pt initially presented to the ED for complaints of Psychiatric Evaluation and Anxiety Currently, the patient is resting in bed without acute agitation.  Physical Exam  BP 113/78 (BP Location: Right Arm)   Pulse 70   Temp 98.1 F (36.7 C) (Oral)   Resp 16   Ht '5\' 10"'$  (1.778 m)   Wt 89.8 kg   SpO2 94%   BMI 28.41 kg/m  Physical Exam General: Resting Cardiac: No murmur on my auscultation Lungs: Clear on exam Psych: No agitation  ED Course / MDM  EKG:EKG Interpretation  Date/Time:  Wednesday October 24 2021 05:46:01 EDT Ventricular Rate:  74 PR Interval:  136 QRS Duration: 144 QT Interval:  408 QTC Calculation: 453 R Axis:   -75 Text Interpretation: Sinus rhythm RBBB and LAFB When compared with ECG of 05/14/2019, Right bundle branch block is now present Confirmed by Delora Fuel ([id number]) on 10/25/2021 12:29:06 AM  I have reviewed the labs performed to date as well as medications administered while in observation.  Recent changes in the last 24 hours include none reported by nursing.  Plan  Current plan is for awaiting placement.    Tagan Bartram, Gwenyth Allegra, MD 12/12/21 [phone]

## 2021-12-13 LAB — CBC WITH DIFFERENTIAL/PLATELET
Abs Immature Granulocytes: 0.03 10*3/uL (ref 0.00–0.07)
Basophils Absolute: 0.1 10*3/uL (ref 0.0–0.1)
Basophils Relative: 1 %
Eosinophils Absolute: 0.1 10*3/uL (ref 0.0–0.5)
Eosinophils Relative: 2 %
HCT: 41.5 % (ref 39.0–52.0)
Hemoglobin: 14.5 g/dL (ref 13.0–17.0)
Immature Granulocytes: 1 %
Lymphocytes Relative: 31 %
Lymphs Abs: 1.8 10*3/uL (ref 0.7–4.0)
MCH: 30.2 pg (ref 26.0–34.0)
MCHC: 34.9 g/dL (ref 30.0–36.0)
MCV: 86.5 fL (ref 80.0–100.0)
Monocytes Absolute: 0.4 10*3/uL (ref 0.1–1.0)
Monocytes Relative: 7 %
Neutro Abs: 3.5 10*3/uL (ref 1.7–7.7)
Neutrophils Relative %: 58 %
Platelets: 143 10*3/uL — ABNORMAL LOW (ref 150–400)
RBC: 4.8 MIL/uL (ref 4.22–5.81)
RDW: 13.5 % (ref 11.5–15.5)
WBC: 5.9 10*3/uL (ref 4.0–10.5)
nRBC: 0 % (ref 0.0–0.2)

## 2021-12-13 NOTE — Progress Notes (Signed)
This CSW has contacted [place] Therapist, music), [place], [place] [facility] [place], and [place], [place], via email to inquire about any updates regarding funding and placement. TOC following.

## 2021-12-13 NOTE — Progress Notes (Addendum)
Pharmacy - Clozapine     This patient's order has been reviewed for prescribing contraindications.   Labs:  ANC 3.5  The medication is being dispensed pursuant to the FDA REMS suspension order of 12/31/19 that allows for dispensing without a patient REMS dispense authorization (RDA).   Eudelia Bunch, Pharm.D Use secure chat for questions 12/13/2021 1:07 PM

## 2021-12-13 NOTE — ED Provider Notes (Signed)
Emergency Medicine Observation Re-evaluation Note  Kaylib Furness is a 61 y.o. male, seen on rounds today.  Pt initially presented to the ED for complaints of needing new ALF placement, hx chronic intermittent behavioral issues. Currently calm, nad  Physical Exam  BP 133/89 (BP Location: Left Arm)   Pulse 95   Temp (!) 97.5 F (36.4 C) (Oral)   Resp 18   Ht 1.778 m ('5\' 10"'$ )   Wt 89.8 kg   SpO2 94%   BMI 28.41 kg/m  Physical Exam General: calm, resting.  Cardiac: regular rate. Lungs: breathing comfortably. Psych: calm, nad  ED Course / MDM    I have reviewed the labs performed to date as well as medications administered while in observation.  Recent changes in the last 24 hours include ED obs, reassessment.   Plan  Current plan is for TOC placement.     Lajean Saver, MD 12/13/21 [phone]

## 2021-12-13 NOTE — Progress Notes (Signed)
TOC supervisor, CSW, LG Peter Parsons, and CC Peter Parsons met for a care team meeting and discussed barriers to proceeding with [name] placement.  Peter Parsons has explained that [name] is not a licence facility and they traditionally only accept innovations waiver funding, which this patient is not a candidate for due to lack of IDD diagnosis.  Peter Parsons that she has submitted an application for a 8592 funding waiver to access additional funds to support this placement, however this application has a review timeline on 4-8 weeks.  Meanwhile, Peter Parsons is continuing to send out referrals.  Peter Parsons LG reports he is also outreaching to other providers, and reports that special assistance funding is not an option for [name] due to being unlicensed, and therefore ineligible for special assistance funding.  TOC will continue to follow this case.

## 2021-12-14 NOTE — ED Notes (Signed)
Peter Parsons  is alert  watching television  denies pain or discomfort also denies suicidal hor homicidal ideation affect is flat but brightens on approach current mood is stable.

## 2021-12-14 NOTE — ED Provider Notes (Signed)
Emergency Medicine Observation Re-evaluation Note  Peter Parsons is a 61 y.o. male, seen on rounds today.  Pt initially presented to the ED for complaints of Psychiatric Evaluation and Anxiety Currently, the patient is awaiting placement in assisted living care facility by [name].  Physical Exam  BP (!) 165/85 (BP Location: Left Arm)   Pulse (!) 111   Temp 98.2 F (36.8 C) (Oral)   Resp 18   Ht 1.778 m ('5\' 10"'$ )   Wt 89.8 kg   SpO2 97%   BMI 28.41 kg/m  Physical Exam General: No acute distress Cardiac: Some hypertension, mild tachycardia Lungs: Normal respiratory rate with normal oxygen saturations Psych: Sting comfortably at this time  ED Course / MDM  EKG:EKG Interpretation  Date/Time:  Wednesday October 24 2021 05:46:01 EDT Ventricular Rate:  74 PR Interval:  136 QRS Duration: 144 QT Interval:  408 QTC Calculation: 453 R Axis:   -75 Text Interpretation: Sinus rhythm RBBB and LAFB When compared with ECG of 05/14/2019, Right bundle branch block is now present Confirmed by Delora Fuel ([id number]) on 10/25/2021 12:29:06 AM  I have reviewed the labs performed to date as well as medications administered while in observation.  Recent changes in the last 24 hours include TOC is continuing to pursue placement.  Plan  Current plan is for TOC to place.    Peter Boss, MD 12/14/21 [phone]

## 2021-12-14 NOTE — ED Notes (Signed)
Patient alert this shift.  Cooperative with care.  Patient medication compliant.  No aggression or agitation noted.

## 2021-12-15 MED ORDER — GABAPENTIN 100 MG PO CAPS
100.0000 mg | ORAL_CAPSULE | Freq: Two times a day (BID) | ORAL | Status: DC
  Administered 2021-12-15 – 2022-02-01 (×96): 100 mg via ORAL
  Filled 2021-12-15 (×99): qty 1

## 2021-12-15 NOTE — ED Notes (Signed)
Pt alert, calm, and cooperative with care this shift. Pt medication compliant. Performs ADLs independently. Pt told me he was anxious multiple times throughout the shift. Pt worries about past behavior, things he says, and his health. Pt denies SI and HI. Pt denies AVH and paranoia.

## 2021-12-15 NOTE — ED Notes (Signed)
Peter Parsons is currently asleep and no signs of distress are noted and his respirations appear easy as noted by the rise and fall of his chest

## 2021-12-15 NOTE — ED Provider Notes (Signed)
Emergency Medicine Observation Re-evaluation Note  Jerret Mcbane is a 61 y.o. male, seen on rounds today.  Pt initially presented to the ED for complaints of Psychiatric Evaluation and Anxiety Currently, the patient is awaiting placement.  Physical Exam  BP 109/67 (BP Location: Left Arm)   Pulse 76   Temp 98.5 F (36.9 C) (Axillary)   Resp 20   Ht 1.778 m ('5\' 10"'$ )   Wt 89.8 kg   SpO2 98%   BMI 28.41 kg/m  Physical Exam   ED Course / MDM  EKG:EKG Interpretation  Date/Time:  Wednesday October 24 2021 05:46:01 EDT Ventricular Rate:  74 PR Interval:  136 QRS Duration: 144 QT Interval:  408 QTC Calculation: 453 R Axis:   -75 Text Interpretation: Sinus rhythm RBBB and LAFB When compared with ECG of 05/14/2019, Right bundle branch block is now present Confirmed by Delora Fuel ([id number]) on 10/25/2021 12:29:06 AM  I have reviewed the labs performed to date as well as medications administered while in observation.  Recent changes in the last 24 hours include none.  Plan  Current plan is for placement.    Lacretia Leigh, MD 12/15/21 [phone]

## 2021-12-15 NOTE — ED Notes (Signed)
Peter Parsons is currently asleep  with no signs of distress noted. Condom cath placed earlier but was remove by pt so he could void all over the floor of his room. Peter Parsons shows intermittent confusion and disorientation but can be reoriented with some effort, condom cath replaced.

## 2021-12-16 NOTE — ED Notes (Addendum)
Pt calm and cooperative with nursing care overnight. Pt slept well overnight

## 2021-12-16 NOTE — ED Notes (Signed)
Pt alert this shift. Cooperative with care and appropriate with staff. No aggression or agitation noted. Patient medication compliant. Pt reported anxiety throughout the shift. Pt performs ADLs independently. Pt denies SI, HI and AVH.

## 2021-12-17 MED ORDER — ARIPIPRAZOLE ER 400 MG IM SRER
400.0000 mg | Freq: Once | INTRAMUSCULAR | Status: AC
  Administered 2021-12-17: 400 mg via INTRAMUSCULAR
  Filled 2021-12-17: qty 2

## 2021-12-17 NOTE — ED Notes (Signed)
Pt calm and cooperative throughout shift. Pt adhered to medication schedule. No agitation, agressiveness, combativeness noted.

## 2021-12-17 NOTE — ED Provider Notes (Signed)
Emergency Medicine Observation Re-evaluation Note  Peter Parsons is a 61 y.o. male, seen on rounds today.  Pt initially presented to the ED for complaints of Psychiatric Evaluation and Anxiety Currently, the patient is sleeping.  Physical Exam  BP 105/73 (BP Location: Left Arm)   Pulse 85   Temp 98.1 F (36.7 C) (Oral)   Resp 18   Ht '5\' 10"'$  (1.778 m)   Wt 89.8 kg   SpO2 95%   BMI 28.41 kg/m  Physical Exam General: No distress Cardiac: Well-perfused  ED Course / MDM  EKG:EKG Interpretation  Date/Time:  Wednesday October 24 2021 05:46:01 EDT Ventricular Rate:  74 PR Interval:  136 QRS Duration: 144 QT Interval:  408 QTC Calculation: 453 R Axis:   -75 Text Interpretation: Sinus rhythm RBBB and LAFB When compared with ECG of 05/14/2019, Right bundle branch block is now present Confirmed by Delora Fuel ([id number]) on 10/25/2021 12:29:06 AM  I have reviewed the labs performed to date as well as medications administered while in observation.  Recent changes in the last 24 hours include awaiting placement.  Plan  Current plan is for awaiting placement.    Ezequiel Essex, MD 12/17/21 [phone]

## 2021-12-18 NOTE — ED Notes (Signed)
Peter Parsons displays a flat affect but brightens on approach verbalized to this Probation officer that he feared someone was coming to kill him. He was reassured that he was safe and that no one was going to harm him . After a brief conversation he was able to calm and return to his room

## 2021-12-18 NOTE — ED Notes (Signed)
Patient alert this shift. Patient cooperative.  Patient medication compliant. Patient anxious at times.,  Patient upset and sad still in hospital. Patient can do all ADLs.

## 2021-12-18 NOTE — ED Provider Notes (Signed)
Emergency Medicine Observation Re-evaluation Note  Peter Parsons is a 61 y.o. male, seen on rounds today.  Pt initially presented to the ED for complaints of Psychiatric Evaluation and Anxiety Currently, the patient is sleeping.  Physical Exam  BP 105/63 (BP Location: Left Arm)   Pulse 75   Temp 98.2 F (36.8 C) (Oral)   Resp 17   Ht '5\' 10"'$  (1.778 m)   Wt 89.8 kg   SpO2 96%   BMI 28.41 kg/m  Physical Exam General: sleeping Cardiac: rr Lungs: clear Psych: calm and cooperative while awake and taking meds  ED Course / MDM  EKG:EKG Interpretation  Date/Time:  Wednesday October 24 2021 05:46:01 EDT Ventricular Rate:  74 PR Interval:  136 QRS Duration: 144 QT Interval:  408 QTC Calculation: 453 R Axis:   -75 Text Interpretation: Sinus rhythm RBBB and LAFB When compared with ECG of 05/14/2019, Right bundle branch block is now present Confirmed by Delora Fuel ([id number]) on 10/25/2021 12:29:06 AM  I have reviewed the labs performed to date as well as medications administered while in observation.  Recent changes in the last 24 hours include none.  Plan  Current plan is for group home placement.    Blanchie Dessert, MD 12/18/21 [phone]

## 2021-12-19 NOTE — ED Provider Notes (Signed)
Emergency Medicine Observation Re-evaluation Note  Peter Parsons is a 61 y.o. male, seen on rounds today.  Pt initially presented to the ED for complaints of Psychiatric Evaluation and Anxiety Currently, the patient is resting.  Physical Exam  BP 118/78 (BP Location: Left Arm)   Pulse 78   Temp 98.9 F (37.2 C) (Oral)   Resp 18   Ht '5\' 10"'$  (1.778 m)   Wt 89.8 kg   SpO2 99%   BMI 28.41 kg/m  Physical Exam General: calm Cardiac: well perfused.  Lungs: unlabored respirations Psych: calm  ED Course / MDM  EKG:EKG Interpretation  Date/Time:  Wednesday October 24 2021 05:46:01 EDT Ventricular Rate:  74 PR Interval:  136 QRS Duration: 144 QT Interval:  408 QTC Calculation: 453 R Axis:   -75 Text Interpretation: Sinus rhythm RBBB and LAFB When compared with ECG of 05/14/2019, Right bundle branch block is now present Confirmed by Delora Fuel ([id number]) on 10/25/2021 12:29:06 AM  I have reviewed the labs performed to date as well as medications administered while in observation.  Recent changes in the last 24 hours include N/A.  Plan  Current plan is for group home placement.    Margette Fast, MD 12/19/21 [phone]

## 2021-12-19 NOTE — ED Notes (Signed)
Behavior remains self controlled affect is flat but brightens with interaction drew several pictures of houses and gave them to staff mood is pleasant.

## 2021-12-19 NOTE — ED Notes (Signed)
Pt sleeping with no signs of distress or sleep disturbance. His respirations appear easy and skin color appropriate for his ethnicity.

## 2021-12-20 LAB — CBC WITH DIFFERENTIAL/PLATELET
Abs Immature Granulocytes: 0.03 10*3/uL (ref 0.00–0.07)
Basophils Absolute: 0 10*3/uL (ref 0.0–0.1)
Basophils Relative: 1 %
Eosinophils Absolute: 0 10*3/uL (ref 0.0–0.5)
Eosinophils Relative: 0 %
HCT: 37.2 % — ABNORMAL LOW (ref 39.0–52.0)
Hemoglobin: 12.8 g/dL — ABNORMAL LOW (ref 13.0–17.0)
Immature Granulocytes: 1 %
Lymphocytes Relative: 24 %
Lymphs Abs: 1.5 10*3/uL (ref 0.7–4.0)
MCH: 30.3 pg (ref 26.0–34.0)
MCHC: 34.4 g/dL (ref 30.0–36.0)
MCV: 87.9 fL (ref 80.0–100.0)
Monocytes Absolute: 0.4 10*3/uL (ref 0.1–1.0)
Monocytes Relative: 6 %
Neutro Abs: 4.2 10*3/uL (ref 1.7–7.7)
Neutrophils Relative %: 68 %
Platelets: 111 10*3/uL — ABNORMAL LOW (ref 150–400)
RBC: 4.23 MIL/uL (ref 4.22–5.81)
RDW: 13.9 % (ref 11.5–15.5)
WBC: 6.2 10*3/uL (ref 4.0–10.5)
nRBC: 0 % (ref 0.0–0.2)

## 2021-12-20 NOTE — ED Notes (Signed)
Peter Parsons is currently asleep with no signs of distress or sleep disturbance his respirations are easy and skin color is appropriate for his ethnicity.

## 2021-12-20 NOTE — ED Notes (Signed)
Peter Parsons is currently sleeping with no signs or symptoms of distress or disturbed sleep. Condom cath place by H. Ross NT functioning WNL

## 2021-12-20 NOTE — ED Provider Notes (Signed)
Emergency Medicine Observation Re-evaluation Note  Peter Parsons is a 61 y.o. male, seen on rounds today.  Pt initially presented to the ED for complaints of Psychiatric Evaluation and Anxiety Currently, the patient is sleeping.  Physical Exam  BP 113/70 (BP Location: Right Arm)   Pulse 73   Temp 98 F (36.7 C) (Axillary)   Resp 20   Ht '5\' 10"'$  (1.778 m)   Wt 89.8 kg   SpO2 93%   BMI 28.41 kg/m  Physical Exam General: nad Cardiac: rr Lungs: clear Psych: calm and cooperative  ED Course / MDM  EKG:EKG Interpretation  Date/Time:  Wednesday October 24 2021 05:46:01 EDT Ventricular Rate:  74 PR Interval:  136 QRS Duration: 144 QT Interval:  408 QTC Calculation: 453 R Axis:   -75 Text Interpretation: Sinus rhythm RBBB and LAFB When compared with ECG of 05/14/2019, Right bundle branch block is now present Confirmed by Delora Fuel ([id number]) on 10/25/2021 12:29:06 AM  I have reviewed the labs performed to date as well as medications administered while in observation.  Recent changes in the last 24 hours include none.  Plan  Current plan is for group home placement.    Blanchie Dessert, MD 12/20/21 1214

## 2021-12-20 NOTE — Progress Notes (Signed)
CM received email notification that patient's legal guardian is not available to meet today for follow up on discharge planning, as a result the scheduled meeting has been cancelled.  CM called and left a HIPAA complaint VM with Waldon Reining with [facility] to follow up regarding any updates from the [facility].

## 2021-12-20 NOTE — Progress Notes (Signed)
Pharmacy - Clozapine     This patient's order has been reviewed for prescribing contraindications.   Labs:  ANC 4.2  The medication is being dispensed pursuant to the FDA REMS suspension order of 12/31/19 that allows for dispensing without a patient REMS dispense authorization (RDA).   Brazil Voytko S. Alford Highland, PharmD, BCPS Clinical Staff Pharmacist [email or web address] 12/20/2021 12:03 PM

## 2021-12-21 NOTE — ED Notes (Signed)
Pt sleeping with no distress noted respirations are easy.

## 2021-12-21 NOTE — ED Notes (Signed)
Jaysten entertaining self drawing pictures and coloring affect bright on approach no states of fear of being harmed by others noted.

## 2021-12-21 NOTE — ED Notes (Signed)
Patient alert this shift. Cooperative with care and appropriate with staff. No aggression or agitation noted. Pt did not require redirection. Pt performs ADLs independently. Pt reported anxious thoughts throughout the shift.

## 2021-12-21 NOTE — ED Notes (Signed)
Pt still awake, but reports he feels less anxious than earlier tonight. Condom cath placed by this RN, as pt sometimes is incontinent during the nighttime. No other needs at this time.

## 2021-12-21 NOTE — ED Notes (Signed)
Sleeping has condom cath in place and it is functioning WNL, no distress or sleep disturbance noted.

## 2021-12-21 NOTE — ED Provider Notes (Signed)
Emergency Medicine Observation Re-evaluation Note  Peter Parsons is a 61 y.o. male, seen on rounds today.  Pt initially presented to the ED for complaints of Psychiatric Evaluation and Anxiety Currently, the patient is sleeping  Physical Exam  BP (!) 115/57 (BP Location: Left Arm)   Pulse 73   Temp (!) 97.5 F (36.4 C) (Axillary)   Resp 16   Ht '5\' 10"'$  (1.778 m)   Wt 89.8 kg   SpO2 (!) 74%   BMI 28.41 kg/m  Physical Exam General: NAD Cardiac: Regular HR Lungs: No respiratory distress Psych: Stable  ED Course / MDM  EKG:EKG Interpretation  Date/Time:  Wednesday October 24 2021 05:46:01 EDT Ventricular Rate:  74 PR Interval:  136 QRS Duration: 144 QT Interval:  408 QTC Calculation: 453 R Axis:   -75 Text Interpretation: Sinus rhythm RBBB and LAFB When compared with ECG of 05/14/2019, Right bundle branch block is now present Confirmed by Delora Fuel ([id number]) on 10/25/2021 12:29:06 AM  I have reviewed the labs performed to date as well as medications administered while in observation.    Plan  Current plan is for group home placement.    Wyvonnia Dusky, MD 12/21/21 [phone]

## 2021-12-22 NOTE — ED Provider Notes (Signed)
Emergency Medicine Observation Re-evaluation Note  Wynne Jury is a 61 y.o. male w/ h/o paranoid schizophrenia, dementia, protein-calorie malnutrition, HTN, T2DM, dyslipidemia, thrombocytopenia, anemia, BPH, who was seen on rounds today.  Pt initially presented to the ED for complaints of Psychiatric Evaluation and Anxiety  Currently, the patient is sleeping comfortably.  Physical Exam  BP (!) 145/93 (BP Location: Left Arm)   Pulse 92   Temp 98 F (36.7 C) (Oral)   Resp 15   Ht '5\' 10"'$  (1.778 m)   Wt 89.8 kg   SpO2 98%   BMI 28.41 kg/m  Physical Exam General: NAD Cardiac: Regular HR Lungs: No respiratory distress Psych: Stable  ED Course / MDM  EKG:EKG Interpretation  Date/Time:  Wednesday October 24 2021 05:46:01 EDT Ventricular Rate:  74 PR Interval:  136 QRS Duration: 144 QT Interval:  408 QTC Calculation: 453 R Axis:   -75 Text Interpretation: Sinus rhythm RBBB and LAFB When compared with ECG of 05/14/2019, Right bundle branch block is now present Confirmed by Delora Fuel ([id number]) on 10/25/2021 12:29:06 AM  I have reviewed the labs performed to date as well as medications administered while in observation.    Plan  Current plan is for group home placement.     Audley Hose, MD 12/22/21 [phone]

## 2021-12-23 NOTE — ED Provider Notes (Signed)
Emergency Medicine Observation Re-evaluation Note  Peter Parsons is a 61 y.o. male, seen on rounds today.  Pt initially presented to the ED for complaints of Psychiatric Evaluation and Anxiety Currently, the patient is resting comfortably. There is history of paranoid schizophrenia, dementia.  Physical Exam  BP 103/64 (BP Location: Right Arm)   Pulse 65   Temp 98 F (36.7 C) (Axillary)   Resp 14   Ht '5\' 10"'$  (1.778 m)   Wt 89.8 kg   SpO2 95%   BMI 28.41 kg/m  Physical Exam General: No distress Cardiac: No tachycardia Lungs: No respiratory distress Psych: Calm  ED Course / MDM  EKG:EKG Interpretation  Date/Time:  Wednesday October 24 2021 05:46:01 EDT Ventricular Rate:  74 PR Interval:  136 QRS Duration: 144 QT Interval:  408 QTC Calculation: 453 R Axis:   -75 Text Interpretation: Sinus rhythm RBBB and LAFB When compared with ECG of 05/14/2019, Right bundle branch block is now present Confirmed by Delora Fuel ([id number]) on 10/25/2021 12:29:06 AM  I have reviewed the labs performed to date as well as medications administered while in observation.  Recent changes in the last 24 hours include no new changes.  No updates..  Plan  Current plan is for holding the patient until he is placed.Varney Biles, MD 12/23/21 1314

## 2021-12-24 NOTE — ED Notes (Signed)
Patient alert this shift. Patient cooperative.  Patient medication compliant. Patient anxious at times during shift. Patient reassured.

## 2021-12-24 NOTE — ED Provider Notes (Signed)
Emergency Medicine Observation Re-evaluation Note  Peter Parsons is a 61 y.o. male, seen on rounds today.  Pt initially presented to the ED for complaints of Psychiatric Evaluation and Anxiety Patient with history of paranoid schizophrenia and dementia.  Currently, the patient is sleeping comfortably.  Physical Exam  BP 103/64 (BP Location: Right Arm)   Pulse 65   Temp 98 F (36.7 C) (Axillary)   Resp 14   Ht '5\' 10"'$  (1.778 m)   Wt 89.8 kg   SpO2 95%   BMI 28.41 kg/m  Physical Exam General: Sleeping comfortably Cardiac: Regular rate Lungs: Breathing comfortably Psych: Calm not agitated  ED Course / MDM  EKG:EKG Interpretation  Date/Time:  Wednesday October 24 2021 05:46:01 EDT Ventricular Rate:  74 PR Interval:  136 QRS Duration: 144 QT Interval:  408 QTC Calculation: 453 R Axis:   -75 Text Interpretation: Sinus rhythm RBBB and LAFB When compared with ECG of 05/14/2019, Right bundle branch block is now present Confirmed by Delora Fuel ([id number]) on 10/25/2021 12:29:06 AM  I have reviewed the labs performed to date as well as medications administered while in observation.  Recent changes in the last 24 hours include no acute changes.  Plan  Current plan is for TTS cleared.  TOC working on placement.    Elgie Congo, MD 12/24/21 [phone]

## 2021-12-25 NOTE — Progress Notes (Signed)
Care team met for meeting, including TOC, LG and [facility].  No new updates in placement at this time.  [facility] to provide an updated list of state licensed group homes to care team to facilitate placement search as patient will need state funding for placement and state funding is not available for unlicensed facilities.  [facility] to have an internal SNS meeting to discuss funding for placement, [facility] to provide updates once this has been scheduled.  LG reports he will reach out to more providers once he receives the updated list.  

## 2021-12-25 NOTE — Progress Notes (Signed)
Care meeting today with [facility], Legal guardian, and Care Coordinator at [facility] at 12:30.

## 2021-12-25 NOTE — ED Notes (Addendum)
Patient alert this shift.  Patient cooperative with care and staff.  Patient is medication compliant. Patient  is anxious, worried and depressed.  RN tried to reassure patient.

## 2021-12-25 NOTE — ED Provider Notes (Signed)
Emergency Medicine Observation Re-evaluation Note  Peter Parsons is a 61 y.o. male, seen on rounds today.  Pt initially presented to the ED for complaints of Psychiatric Evaluation and Anxiety Currently, the patient is resting comfortably.  Physical Exam  BP 126/69 (BP Location: Left Arm)   Pulse 93   Temp 97.7 F (36.5 C) (Oral)   Resp 18   Ht '5\' 10"'$  (1.778 m)   Wt 89.8 kg   SpO2 93%   BMI 28.41 kg/m  Physical Exam General: NAD   ED Course / MDM  EKG:EKG Interpretation  Date/Time:  Wednesday October 24 2021 05:46:01 EDT Ventricular Rate:  74 PR Interval:  136 QRS Duration: 144 QT Interval:  408 QTC Calculation: 453 R Axis:   -75 Text Interpretation: Sinus rhythm RBBB and LAFB When compared with ECG of 05/14/2019, Right bundle branch block is now present Confirmed by Delora Fuel ([id number]) on 10/25/2021 12:29:06 AM  I have reviewed the labs performed to date as well as medications administered while in observation.  Recent changes in the last 24 hours include no acute events reported.  Plan  Current plan is for placement.    Valarie Merino, MD 12/25/21 1019

## 2021-12-26 NOTE — ED Provider Notes (Signed)
Emergency Medicine Observation Re-evaluation Note  Peter Parsons is a 61 y.o. male, seen on rounds today.  Pt initially presented to the ED for complaints of Psychiatric Evaluation and Anxiety Currently, the patient is resting.  Physical Exam  BP 120/74 (BP Location: Left Arm)   Pulse (!) 109   Temp 99.1 F (37.3 C) (Oral)   Resp 18   Ht '5\' 10"'$  (1.778 m)   Wt 89.8 kg   SpO2 92%   BMI 28.41 kg/m  Physical Exam General: NAD Cardiac: well perfused Lungs: even and unlabored Psych: no agitation  ED Course / MDM  EKG:EKG Interpretation  Date/Time:  Wednesday October 24 2021 05:46:01 EDT Ventricular Rate:  74 PR Interval:  136 QRS Duration: 144 QT Interval:  408 QTC Calculation: 453 R Axis:   -75 Text Interpretation: Sinus rhythm RBBB and LAFB When compared with ECG of 05/14/2019, Right bundle branch block is now present Confirmed by Delora Fuel ([id number]) on 10/25/2021 12:29:06 AM  I have reviewed the labs performed to date as well as medications administered while in observation.  Recent changes in the last 24 hours include none.  Plan  Current plan is for SW placement.    Regan Lemming, MD 12/26/21 [phone]

## 2021-12-27 LAB — CBC WITH DIFFERENTIAL/PLATELET
Abs Immature Granulocytes: 0.02 10*3/uL (ref 0.00–0.07)
Basophils Absolute: 0 10*3/uL (ref 0.0–0.1)
Basophils Relative: 1 %
Eosinophils Absolute: 0 10*3/uL (ref 0.0–0.5)
Eosinophils Relative: 0 %
HCT: 41 % (ref 39.0–52.0)
Hemoglobin: 13.7 g/dL (ref 13.0–17.0)
Immature Granulocytes: 0 %
Lymphocytes Relative: 36 %
Lymphs Abs: 1.8 10*3/uL (ref 0.7–4.0)
MCH: 29.4 pg (ref 26.0–34.0)
MCHC: 33.4 g/dL (ref 30.0–36.0)
MCV: 88 fL (ref 80.0–100.0)
Monocytes Absolute: 0.5 10*3/uL (ref 0.1–1.0)
Monocytes Relative: 10 %
Neutro Abs: 2.6 10*3/uL (ref 1.7–7.7)
Neutrophils Relative %: 53 %
Platelets: 112 10*3/uL — ABNORMAL LOW (ref 150–400)
RBC: 4.66 MIL/uL (ref 4.22–5.81)
RDW: 14 % (ref 11.5–15.5)
WBC: 4.9 10*3/uL (ref 4.0–10.5)
nRBC: 0 % (ref 0.0–0.2)

## 2021-12-27 NOTE — ED Notes (Addendum)
Sleeping with no signs of distress respirations are easy and skin color is appropriate

## 2021-12-27 NOTE — ED Notes (Signed)
Pt given 2200 medication per his request currently denies feeling of paranoia affect bright on approach .

## 2021-12-27 NOTE — Progress Notes (Signed)
TOC and CC met for a care team meeting to discuss placement updates.  CC reports she has submitted all documents for the internal SNS meeting but it has not been scheduled yet.  CC also reports that the 1015I request for additional state funding is still in process with no updates at this time.  CC continues to outreach to mental health group homes and is exploring the possibility of patient qualifying for level 2 supportive living, but will not know if this is an option until after the SNS meeting.

## 2021-12-27 NOTE — Progress Notes (Signed)
REMS Clozapine program Update:  Patient & MD registered; Labs entered and acceptable.  - 11/16 ANC: 2.5  Per Clozapine [facility] program, acceptable to continue medication. [place] monitoring required weekly  Reuel Boom, PharmD, BCPS [phone] 12/27/2021, 11:23 AM

## 2021-12-27 NOTE — ED Provider Notes (Signed)
Emergency Medicine Observation Re-evaluation Note  Peter Parsons is a 61 y.o. male, seen on rounds today.  Pt initially presented to the ED for complaints of Psychiatric Evaluation and Anxiety Currently, the patient is medically and psychiatrically cleared and awaiting new assisted living placement.  Sleeping comfortably this AM  Physical Exam  BP 130/80 (BP Location: Left Arm)   Pulse (!) 105   Temp 99 F (37.2 C) (Oral)   Resp 18   Ht '5\' 10"'$  (1.778 m)   Wt 89.8 kg   SpO2 92%   BMI 28.41 kg/m  Physical Exam General: sleeping comfortably  Cardiac: regular rate Lungs: breathing comfortably, normal rate  ED Course / MDM  EKG:EKG Interpretation  Date/Time:  Wednesday October 24 2021 05:46:01 EDT Ventricular Rate:  74 PR Interval:  136 QRS Duration: 144 QT Interval:  408 QTC Calculation: 453 R Axis:   -75 Text Interpretation: Sinus rhythm RBBB and LAFB When compared with ECG of 05/14/2019, Right bundle branch block is now present Confirmed by Delora Fuel ([id number]) on 10/25/2021 12:29:06 AM  I have reviewed the labs performed to date as well as medications administered while in observation.  Recent changes in the last 24 hours include.  Plan  Current plan is awaiting placement.    Gareth Morgan, MD 12/27/21 2211

## 2021-12-28 NOTE — ED Notes (Signed)
Peter Parsons is current asleep no signs of distressed or disturbed sleep noted. His respirations are easy and his skin color is appropriate for his ethnicity.

## 2021-12-28 NOTE — ED Provider Notes (Signed)
Emergency Medicine Observation Re-evaluation Note  Nixxon Faria is a 61 y.o. male, seen on rounds today.  Pt initially presented to the ED for complaints of Psychiatric Evaluation and Anxiety Currently, the patient is resting comfortably.  Physical Exam  BP 105/69 (BP Location: Left Arm)   Pulse 96   Temp (!) 97.5 F (36.4 C) (Oral)   Resp 16   Ht '5\' 10"'$  (1.778 m)   Wt 89.8 kg   SpO2 98%   BMI 28.41 kg/m  Physical Exam General: NAD   ED Course / MDM  EKG:EKG Interpretation  Date/Time:  Wednesday October 24 2021 05:46:01 EDT Ventricular Rate:  74 PR Interval:  136 QRS Duration: 144 QT Interval:  408 QTC Calculation: 453 R Axis:   -75 Text Interpretation: Sinus rhythm RBBB and LAFB When compared with ECG of 05/14/2019, Right bundle branch block is now present Confirmed by Delora Fuel ([id number]) on 10/25/2021 12:29:06 AM  I have reviewed the labs performed to date as well as medications administered while in observation.  Recent changes in the last 24 hours include no acute events reported.  Plan  Current plan is for placement.    Valarie Merino, MD 12/28/21 [phone]

## 2021-12-28 NOTE — ED Notes (Signed)
Peter Parsons frequently activates his nurse call bell then resetting it before staff could make to his room. When asked if he needed anything he would state that he activated the bell by accident. His affect is flat but brightens on approach mood is stable with no aggressive bx noted.

## 2021-12-29 MED ORDER — LORAZEPAM 2 MG/ML IJ SOLN
2.0000 mg | Freq: Once | INTRAMUSCULAR | Status: AC
  Administered 2021-12-29: 2 mg via INTRAMUSCULAR
  Filled 2021-12-29: qty 1

## 2021-12-29 NOTE — ED Notes (Signed)
this patient keeps pacing back and forth to nurse station. says he doesn't feel okay. feels anxious. we have redirected, given snacks, given prn meds. patient says no improvement. patient requesting shot of risperidal. says it "really helps him". MD notified.

## 2021-12-29 NOTE — ED Notes (Signed)
Hourly rounding has been completed on this patient all shift. Patient has showered, finished all meal trays. Patient was given additional snacks in between meals. Patients anxiety/concerns have been address. Patient ambulates to nurses station a couple of times per hour.

## 2021-12-29 NOTE — ED Notes (Signed)
Patient ambulated to nurse station at this time stating "hey, that shot fixed it. You really know what you're doing".

## 2021-12-29 NOTE — ED Provider Notes (Signed)
Emergency Medicine Observation Re-evaluation Note  Javione Gunawan is a 61 y.o. male, seen on rounds today.  Pt initially presented to the ED for complaints of Psychiatric Evaluation and Anxiety Currently, the patient is asleep.  Physical Exam  BP 105/69 (BP Location: Left Arm)   Pulse 96   Temp (!) 97.5 F (36.4 C) (Oral)   Resp 16   Ht '5\' 10"'$  (1.778 m)   Wt 89.8 kg   SpO2 98%   BMI 28.41 kg/m  Physical Exam General: asleep Cardiac: asleep Lungs: asleep Psych: asleep  ED Course / MDM  EKG:EKG Interpretation  Date/Time:  Wednesday October 24 2021 05:46:01 EDT Ventricular Rate:  74 PR Interval:  136 QRS Duration: 144 QT Interval:  408 QTC Calculation: 453 R Axis:   -75 Text Interpretation: Sinus rhythm RBBB and LAFB When compared with ECG of 05/14/2019, Right bundle branch block is now present Confirmed by Delora Fuel ([id number]) on 10/25/2021 12:29:06 AM  I have reviewed the labs performed to date as well as medications administered while in observation.  No recent changes in the last 24 hours.  Plan  Current plan is for placement.    Sherwood Gambler, MD 12/29/21 [phone]

## 2021-12-29 NOTE — ED Notes (Signed)
Patient at nurse station at this time asking "will everything be alright with me? I can go to work, I just have a little high blood pressure". Nurse educated patient that follow up with placement would be continued on Monday.

## 2021-12-29 NOTE — ED Notes (Addendum)
Patient c/o anxiety, prn meds administered

## 2021-12-30 NOTE — ED Notes (Signed)
Sleeping refused condom cath this shift . Remains asleep at this time no sleep disturbance noted.

## 2021-12-30 NOTE — ED Notes (Signed)
Cain continues to fear someone harming him but is easily reassured.

## 2021-12-30 NOTE — ED Provider Notes (Signed)
Emergency Medicine Observation Re-evaluation Note  Osie Amparo is a 61 y.o. male, seen on rounds today.  Pt initially presented to the ED for complaints of chronic behavioral symptoms, currently awaiting placement. No new c/o this AM.   Physical Exam  BP 116/78 (BP Location: Left Arm)   Pulse 81   Temp 98.1 F (36.7 C) (Oral)   Resp 16   Ht 1.778 m ('5\' 10"'$ )   Wt 89.8 kg   SpO2 98%   BMI 28.41 kg/m  Physical Exam General: resting.  Cardiac: regular rate. Lungs: breathing comfortably. Psych: calm resting.   ED Course / MDM    I have reviewed the labs performed to date as well as medications administered while in observation.  Recent changes in the last 24 hours include ED obs, med management, toc placement.   Plan  Pt continues to wait for TOC placement - no new updates for past several days.   It appears LME/guardian/TOC may need to consider/pursue other placement options for patients as it appears no current definitive plan or progress towards  placement (per review of notes).     Lajean Saver, MD 12/30/21 [phone]

## 2021-12-30 NOTE — ED Notes (Signed)
Patient alert this shift. Patient cooperative with care. Patient appropriate with staff. Patient medication compliant. Patient ambulatory and can do all ADLs.

## 2021-12-30 NOTE — ED Notes (Signed)
Remains asleep no distress noted skin color appropriate for ethnicity.

## 2021-12-31 NOTE — ED Notes (Signed)
Early in the shift was playing cards with peer having positive interaction displaying a bright affect and positive happy mood. Cataldo has been cooperative and compliant with staff request although border line attention seek behavior noted with frequent use of call light then denying and needs when staff response. No paranoid stated thus far this shift .

## 2021-12-31 NOTE — ED Notes (Signed)
Pt slept well overnight with a few instances of wakefulness. Pt calm and cooperative with nursing care.

## 2021-12-31 NOTE — ED Provider Notes (Signed)
Emergency Medicine Observation Re-evaluation Note  Peter Parsons is a 61 y.o. male, seen on rounds today.  Pt initially presented to the ED for complaints of Psychiatric Evaluation and Anxiety Currently, the patient is resting.  Physical Exam  BP 115/80 (BP Location: Left Arm)   Pulse 91   Temp 98.8 F (37.1 C) (Oral)   Resp 20   Ht 1.778 m ('5\' 10"'$ )   Wt 89.8 kg   SpO2 97%   BMI 28.41 kg/m  Physical Exam General: No acute distress Cardiac: Regular rate Lungs: Breathing easily Psych: Deferred ED Course / MDM  EKG:EKG Interpretation  Date/Time:  Wednesday October 24 2021 05:46:01 EDT Ventricular Rate:  74 PR Interval:  136 QRS Duration: 144 QT Interval:  408 QTC Calculation: 453 R Axis:   -75 Text Interpretation: Sinus rhythm RBBB and LAFB When compared with ECG of 05/14/2019, Right bundle branch block is now present Confirmed by Delora Fuel ([id number]) on 10/25/2021 12:29:06 AM  I have reviewed the labs performed to date as well as medications administered while in observation.  Recent changes in the last 24 hours include no acute changes.  Plan  Current plan is for placement.  Urine culture on 1018 showed 10,000 colonies Enterobacter.  No urinary symptoms will monitor    Dorie Rank, MD 12/31/21 [phone]

## 2022-01-01 NOTE — Progress Notes (Signed)
TOC met for a care team meeting.  CC with [facility] reports that the SNS meeting was denied.  As such, she is moving forward with searching for a group home that provides services for patients with mental health only, since patient lacks an IDD diagnosis and cannot access additional funding as a result.  CC reports she has requested a list of in-network mental health group home providers and will initiate contacting providers once this list is received.  This CM also provided contact information for [facility]/ Gayla Doss as that provider has level2/ supervised living services to [facility] and CC with request to have LG contact provider for review.  Next care team meeting was scheduled for  Tuesday 11/28 with plans to have twice weekly meetings until placement is secured.

## 2022-01-01 NOTE — ED Provider Notes (Signed)
Emergency Medicine Observation Re-evaluation Note  Peter Parsons is a 61 y.o. male, seen on rounds today.  Pt initially presented to the ED for complaints of Psychiatric Evaluation and Anxiety Currently, the patient is resting and awaiting placement.  Physical Exam  BP 125/65 (BP Location: Left Arm)   Pulse 98   Temp 97.6 F (36.4 C) (Oral)   Resp 18   Ht 1.778 m ('5\' 10"'$ )   Wt 89.8 kg   SpO2 98%   BMI 28.41 kg/m  Physical Exam General: sleeping Cardiac: hr and bp wnl Lungs: no distress, sats 98% Psych: resting comfortably  ED Course / MDM  EKG:EKG Interpretation  Date/Time:  Wednesday October 24 2021 05:46:01 EDT Ventricular Rate:  74 PR Interval:  136 QRS Duration: 144 QT Interval:  408 QTC Calculation: 453 R Axis:   -75 Text Interpretation: Sinus rhythm RBBB and LAFB When compared with ECG of 05/14/2019, Right bundle branch block is now present Confirmed by Delora Fuel ([id number]) on 10/25/2021 12:29:06 AM  I have reviewed the labs performed to date as well as medications administered while in observation.  Recent changes in the last 24 hours include none.  Plan  Current plan is for TOC placement.    [name], MD 01/01/22 [phone]

## 2022-01-01 NOTE — ED Notes (Signed)
Peter Parsons remains asleep no signs of distress or sleep disturbance are noted and his respirations appear easy.

## 2022-01-01 NOTE — ED Notes (Signed)
Peter Parsons is sleeping with no distress noted condition is currently stable.

## 2022-01-01 NOTE — ED Notes (Signed)
Pt has been somnolent today. Pt awakens easily, respirations are even and unlabored, no neurological deficits noted. Pt has not eaten breakfast or lunch. Denies pain or other c/o.

## 2022-01-02 NOTE — [place] FL2 (Signed)
[place] LEVEL OF CARE SCREENING TOOL     IDENTIFICATION  Patient Name: Peter Parsons Birthdate: 01-05-1961 Sex: male Admission Date (Current Location): 10/23/2021  [place] and [place] Number:  Peter Parsons [id number] [place] and Address:  [facility],  [place] [address] [place]      Provider Number: [id number]  Attending Physician Name and Address:  Default, Provider, MD  Relative Name and Phone Number:  Peter Parsons, [phone]    Current Level of Care: Hospital Recommended Level of Care: Other (Comment) (supervised living- level II) Prior Approval Number:    Date Approved/Denied:   PASRR Number:    Discharge Plan: Other (Comment) (supervised living- level II)    Current Diagnoses: Patient Active Problem List   Diagnosis Date Noted   Enterococcal bacteremia 05/15/2019   Hypertension 05/15/2019   Type 2 diabetes mellitus ([place]) 05/15/2019   Dyslipidemia 05/15/2019   BPH (benign prostatic hyperplasia) 05/15/2019   Normocytic anemia 05/15/2019   Thrombocytopenia ([place]) 05/15/2019   Acute urinary retention 05/15/2019   Urethral trauma 05/15/2019   UTI (urinary tract infection) 05/15/2019   Pressure injury of skin 64/40/3474   Acute metabolic encephalopathy 25/95/6387   Protein-calorie malnutrition, severe 03/29/2019   Weakness 03/27/2019   Dementia ([place]) 03/19/2019   Altered mental status    Paranoid schizophrenia ([place])     Orientation RESPIRATION BLADDER Height & Weight     Self, Time, Situation, Place  Normal Continent Weight: 89.8 kg Height:  '5\' 10"'$  (177.8 cm)  BEHAVIORAL SYMPTOMS/MOOD NEUROLOGICAL BOWEL NUTRITION STATUS      Continent Diet (regular)  AMBULATORY STATUS COMMUNICATION OF NEEDS Skin   Independent Verbally Normal                       Personal Care Assistance Level of Assistance  Bathing, Feeding, Dressing Bathing Assistance: Independent Feeding assistance: Independent Dressing  Assistance: Independent     Functional Limitations Info  Sight, Hearing, Speech Sight Info: Adequate Hearing Info: Adequate Speech Info: Adequate    SPECIAL CARE FACTORS FREQUENCY                       Contractures Contractures Info: Not present    Additional Factors Info  Code Status, Allergies Code Status Info: Full Allergies Info: acetaminophen Psychotropic Info: clozaril, ativan, zoloft         Current Medications (01/02/2022):  This is the current hospital active medication list Current Facility-Administered Medications  Medication Dose Route Frequency Provider Last Rate Last Admin   cloZAPine (CLOZARIL) tablet 300 mg  300 mg Oral QHS Lindon Romp A, NP   300 mg at 01/01/22 2209   gabapentin (NEURONTIN) capsule 100 mg  100 mg Oral BID Lindon Romp A, NP   100 mg at 01/02/22 1002   LORazepam (ATIVAN) tablet 2 mg  2 mg Oral BID Lindon Romp A, NP   2 mg at 01/02/22 1001   LORazepam (ATIVAN) tablet 2 mg  2 mg Oral Daily PRN Sherwood Gambler, MD   2 mg at 12/29/21 1208   nicotine (NICODERM CQ - dosed in mg/24 hours) patch 14 mg  14 mg Transdermal Daily Godfrey Pick, MD   14 mg at 01/02/22 1002   sertraline (ZOLOFT) tablet 200 mg  200 mg Oral Daily Lindon Romp A, NP   200 mg at 01/02/22 1002   tamsulosin (FLOMAX) capsule 0.4 mg  0.4 mg Oral Daily Milton Ferguson, MD   0.4 mg  at 01/01/22 0956   Current Outpatient Medications  Medication Sig Dispense Refill   ARIPIPRAZOLE IM Inject into the muscle every 30 (thirty) days.     atenolol (TENORMIN) 25 MG tablet Take 25 mg by mouth at bedtime.     cloZAPine (CLOZARIL) 100 MG tablet Take 300 mg by mouth at bedtime.     ibuprofen (ADVIL) 200 MG tablet Take 1 tablet (200 mg total) by mouth every 6 (six) hours as needed for fever, headache or moderate pain. 30 tablet 0   LORazepam (ATIVAN) 0.5 MG tablet Take 1.5 mg by mouth 2 (two) times daily.     LORazepam (ATIVAN) 2 MG tablet Take 2 mg by mouth daily.     nystatin cream  (MYCOSTATIN) Apply topically 2 (two) times daily. 30 g 0   polyethylene glycol (MIRALAX / GLYCOLAX) 17 g packet Take 17 g by mouth 2 (two) times daily. 14 each 0   pravastatin (PRAVACHOL) 40 MG tablet Take 40 mg by mouth daily.     senna (SENOKOT) 8.6 MG TABS tablet Take 4 tablets by mouth 2 (two) times daily.     senna-docusate (SENOKOT-S) 8.6-50 MG tablet Take 2 tablets by mouth 2 (two) times daily. 60 tablet 0   sertraline (ZOLOFT) 100 MG tablet Take 200 mg by mouth daily.     tamsulosin (FLOMAX) 0.4 MG CAPS capsule Take 0.4 mg by mouth daily.     chlorhexidine (PERIDEX) 0.12 % solution Use as directed 15 mLs in the mouth or throat 2 (two) times daily. (Patient not taking: Reported on 10/23/2021) 120 mL 0   clonazePAM (KLONOPIN) 0.5 MG tablet Take 0.5 tablets (0.25 mg total) by mouth 3 (three) times daily as needed (anxiety). (Patient not taking: Reported on 10/23/2021) 30 tablet 0   escitalopram (LEXAPRO) 10 MG tablet Take 1 tablet (10 mg total) by mouth daily. (Patient not taking: Reported on 10/23/2021) 30 tablet 0   traZODone (DESYREL) 50 MG tablet Take 1 tablet (50 mg total) by mouth at bedtime. (Patient not taking: Reported on 10/23/2021) 30 tablet 0   ziprasidone (GEODON) 40 MG capsule Take 1 capsule (40 mg total) by mouth daily at 8 pm. (Patient not taking: Reported on 10/23/2021) 30 capsule 0   ziprasidone (GEODON) 60 MG capsule Take 1 capsule (60 mg total) by mouth daily at 8 pm. (Patient not taking: Reported on 10/23/2021) 30 capsule 0     Discharge Medications: Please see discharge summary for a list of discharge medications.  Relevant Imaging Results:  Relevant Lab Results:   Additional Information SSN: [id number]  Peter Courts, RN
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
## 2022-01-02 NOTE — ED Provider Notes (Signed)
Emergency Medicine Observation Re-evaluation Note  Peter Parsons is a 61 y.o. male who presented with suicidal ideation, seen on rounds today.  Pt initially presented to the ED for complaints of Psychiatric Evaluation and Anxiety Currently, the patient is resting comfortably.  Physical Exam  BP 126/85 (BP Location: Left Arm)   Pulse (!) 114   Temp (!) 97.5 F (36.4 C) (Oral)   Resp 18   Ht '5\' 10"'$  (1.778 m)   Wt 89.8 kg   SpO2 95%   BMI 28.41 kg/m  Physical Exam General: Sleeping in stretcher Lungs: No respiratory distress Psych: Calm  ED Course / MDM  EKG:EKG Interpretation  Date/Time:  Wednesday October 24 2021 05:46:01 EDT Ventricular Rate:  74 PR Interval:  136 QRS Duration: 144 QT Interval:  408 QTC Calculation: 453 R Axis:   -75 Text Interpretation: Sinus rhythm RBBB and LAFB When compared with ECG of 05/14/2019, Right bundle branch block is now present Confirmed by Delora Fuel ([id number]) on 10/25/2021 12:29:06 AM  I have reviewed the labs performed to date as well as medications administered while in observation.  Recent changes in the last 24 hours include continues to be depressed.  Plan  Current plan is for TOC placement.    Fransico Meadow, MD 01/02/22 [phone]

## 2022-01-03 LAB — CBC WITH DIFFERENTIAL/PLATELET
Abs Immature Granulocytes: 0.02 10*3/uL (ref 0.00–0.07)
Basophils Absolute: 0 10*3/uL (ref 0.0–0.1)
Basophils Relative: 1 %
Eosinophils Absolute: 0 10*3/uL (ref 0.0–0.5)
Eosinophils Relative: 0 %
HCT: 41.1 % (ref 39.0–52.0)
Hemoglobin: 14.1 g/dL (ref 13.0–17.0)
Immature Granulocytes: 0 %
Lymphocytes Relative: 17 %
Lymphs Abs: 1.3 10*3/uL (ref 0.7–4.0)
MCH: 30.2 pg (ref 26.0–34.0)
MCHC: 34.3 g/dL (ref 30.0–36.0)
MCV: 88 fL (ref 80.0–100.0)
Monocytes Absolute: 0.5 10*3/uL (ref 0.1–1.0)
Monocytes Relative: 6 %
Neutro Abs: 5.8 10*3/uL (ref 1.7–7.7)
Neutrophils Relative %: 76 %
Platelets: 139 10*3/uL — ABNORMAL LOW (ref 150–400)
RBC: 4.67 MIL/uL (ref 4.22–5.81)
RDW: 14.2 % (ref 11.5–15.5)
WBC: 7.6 10*3/uL (ref 4.0–10.5)
nRBC: 0 % (ref 0.0–0.2)

## 2022-01-03 NOTE — ED Provider Notes (Signed)
Emergency Medicine Observation Re-evaluation Note  Peter Parsons is a 61 y.o. male, seen on rounds today.  Pt initially presented to the ED for complaints of Psychiatric Evaluation and Anxiety Currently, the patient is sleeping.  Physical Exam  BP 134/89 (BP Location: Left Arm)   Pulse 99   Temp 98.8 F (37.1 C) (Oral)   Resp 14   Ht '5\' 10"'$  (1.778 m)   Wt 89.8 kg   SpO2 98%   BMI 28.41 kg/m  Physical Exam General: Sleeping Cardiac: Normal rate Lungs: No increased work of breathing Psych: Calm  ED Course / MDM  EKG:EKG Interpretation  Date/Time:  Wednesday October 24 2021 05:46:01 EDT Ventricular Rate:  74 PR Interval:  136 QRS Duration: 144 QT Interval:  408 QTC Calculation: 453 R Axis:   -75 Text Interpretation: Sinus rhythm RBBB and LAFB When compared with ECG of 05/14/2019, Right bundle branch block is now present Confirmed by Delora Fuel ([id number]) on 10/25/2021 12:29:06 AM  I have reviewed the labs performed to date as well as medications administered while in observation.  Recent changes in the last 24 hours include none.  Plan  Current plan is for awaiting placement.    Malvin Johns, MD 01/03/22 [phone]

## 2022-01-03 NOTE — ED Notes (Signed)
Patient alert this shift. Cooperative with care and appropriate with staff. No aggression or agitation was noted. Pt did not require redirection. Pt performs ADLs independently. Medication compliant.

## 2022-01-03 NOTE — ED Notes (Signed)
Pt calm and cooperative at this time. Pt meds were given with no issues.

## 2022-01-04 NOTE — ED Notes (Signed)
Pt has been sleeping peacefully for the night.

## 2022-01-04 NOTE — Progress Notes (Signed)
REMS Clozapine program Update:  Patient & MD registered; Labs entered and acceptable.  - 11/23 ANC: 5.8  Per Clozapine REMS program, acceptable to continue medication. [place] monitoring required weekly  Nigeria Lasseter S. Alford Highland, PharmD, BCPS Clinical Staff Pharmacist [email or web address] 01/04/2022, 9:55 AM

## 2022-01-04 NOTE — ED Notes (Signed)
Condom cath placed on per pt request.

## 2022-01-04 NOTE — ED Provider Notes (Signed)
Emergency Medicine Observation Re-evaluation Note  Peter Parsons is a 61 y.o. male, seen on rounds today.  Pt initially presented to the ED for complaints of Psychiatric Evaluation and Anxiety Currently, the patient is resting.  Physical Exam  BP 137/76 (BP Location: Right Arm)   Pulse (!) 103   Temp 98.7 F (37.1 C) (Oral)   Resp 17   Ht '5\' 10"'$  (1.778 m)   Wt 89.8 kg   SpO2 96%   BMI 28.41 kg/m  Physical Exam General: No acute distress Cardiac: tachycardia Lungs: No respiratory distress Psych: Calm  ED Course / MDM  EKG:EKG Interpretation  Date/Time:  Wednesday October 24 2021 05:46:01 EDT Ventricular Rate:  74 PR Interval:  136 QRS Duration: 144 QT Interval:  408 QTC Calculation: 453 R Axis:   -75 Text Interpretation: Sinus rhythm RBBB and LAFB When compared with ECG of 05/14/2019, Right bundle branch block is now present Confirmed by Delora Fuel ([id number]) on 10/25/2021 12:29:06 AM  I have reviewed the labs performed to date as well as medications administered while in observation.  Recent changes in the last 24 hours include no new changes. Patient had a TOC note on 11-22.  They are still looking for placement.  Plan  Current plan is for patient boarding in the ER, awaiting placement. Tachycardic, but per nursing staff patient has been eating and drinking well. I reviewed labs.  Patient's CBC was reassuring. UA had bacteriuria, hematuria but the cultures only have 10,000 colonies.  No fevers.  Electrolytes are reassuring.    Varney Biles, MD 01/04/22 [phone]

## 2022-01-04 NOTE — ED Notes (Signed)
Pt calm and cooperative with care. Pt smiling and pleasant throughout the shift. Pt medication compliant. No distress noted. Pt denies SI, HI, and AVH. Pt did not report anxiety.

## 2022-01-05 NOTE — ED Notes (Signed)
Patient resting quietly on bed in room with eyes closed.

## 2022-01-05 NOTE — ED Provider Notes (Signed)
Emergency Medicine Observation Re-evaluation Note  Cebert Dettmann is a 61 y.o. male, seen on rounds today.  Pt initially presented to the ED for complaints of Psychiatric Evaluation and Anxiety Currently, the patient is resting.  Physical Exam  BP 132/88 (BP Location: Right Arm)   Pulse 75   Temp 98.2 F (36.8 C) (Oral)   Resp 16   Ht '5\' 10"'$  (1.778 m)   Wt 89.8 kg   SpO2 97%   BMI 28.41 kg/m  Physical Exam General: No acute distress Cardiac: tachycardia Lungs: No respiratory distress Psych: Calm  ED Course / MDM  EKG:EKG Interpretation  Date/Time:  Wednesday October 24 2021 05:46:01 EDT Ventricular Rate:  74 PR Interval:  136 QRS Duration: 144 QT Interval:  408 QTC Calculation: 453 R Axis:   -75 Text Interpretation: Sinus rhythm RBBB and LAFB When compared with ECG of 05/14/2019, Right bundle branch block is now present Confirmed by Delora Fuel ([id number]) on 10/25/2021 12:29:06 AM  I have reviewed the labs performed to date as well as medications administered while in observation.  Recent changes in the last 24 hours include no new changes. Patient had a TOC note on 11-22.  They are still looking for placement.  Plan  Current plan is for patient boarding in the ER, awaiting placement. Tachycardic, but per nursing staff patient has been eating and drinking well.     Deno Etienne, DO 01/05/22 [phone]

## 2022-01-05 NOTE — ED Notes (Signed)
Patient calm and cooperative.  Currently playing cards with another patient.  No complaints voiced.  Will continue to monitor

## 2022-01-06 NOTE — Progress Notes (Signed)
Attempted to contact legal guardian [name], left HIPAA compliant message to call Nursing Supervisor.
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
## 2022-01-07 NOTE — ED Provider Notes (Signed)
Emergency Medicine Observation Re-evaluation Note  Peter Parsons is a 61 y.o. male, seen on rounds today.  Pt initially presented to the ED for complaints of Psychiatric Evaluation and Anxiety Currently, the patient is resting comfortably no acute distress.  Physical Exam  BP 106/65 (BP Location: Left Arm)   Pulse 72   Temp 98.1 F (36.7 C) (Oral)   Resp 18   Ht '5\' 10"'$  (1.778 m)   Wt 89.8 kg   SpO2 97%   BMI 28.41 kg/m  Physical Exam General: Appears to be resting comfortably in bed, no acute distress. Cardiac: Regular rate, normal heart rate, non-emergent blood pressure for this morning's vitals. Lungs: No increased work of breathing.  Equal chest rise appreciated Psych: Calm, asleep in bed.   ED Course / MDM  EKG:EKG Interpretation  Date/Time:  Wednesday October 24 2021 05:46:01 EDT Ventricular Rate:  74 PR Interval:  136 QRS Duration: 144 QT Interval:  408 QTC Calculation: 453 R Axis:   -75 Text Interpretation: Sinus rhythm RBBB and LAFB When compared with ECG of 05/14/2019, Right bundle branch block is now present Confirmed by Delora Fuel ([id number]) on 10/25/2021 12:29:06 AM  I have reviewed the labs performed to date as well as medications administered while in observation.   Plan  Current plan is for social worker assistance in arrangement of safe disposition.    Tretha Sciara, MD 01/07/22 [phone]

## 2022-01-08 NOTE — ED Provider Notes (Signed)
Emergency Medicine Observation Re-evaluation Note  Peter Parsons is a 61 y.o. male, seen on rounds today.  Pt initially presented to the ED for complaints of Psychiatric Evaluation and Anxiety Currently, the patient is medically and psychiatrically cleared awaiting placement. No concerns per nursing staff overnight.  Physical Exam  BP 121/72 (BP Location: Left Arm)   Pulse 75   Temp 98.7 F (37.1 C) (Oral)   Resp 16   Ht '5\' 10"'$  (1.778 m)   Wt 89.8 kg   SpO2 97%   BMI 28.41 kg/m  Physical Exam General: Sleeping, NAD  Cardiac: RR Lungs: No distress Psych: N/A, sleeping  ED Course / MDM  EKG:EKG Interpretation  Date/Time:  Wednesday October 24 2021 05:46:01 EDT Ventricular Rate:  74 PR Interval:  136 QRS Duration: 144 QT Interval:  408 QTC Calculation: 453 R Axis:   -75 Text Interpretation: Sinus rhythm RBBB and LAFB When compared with ECG of 05/14/2019, Right bundle branch block is now present Confirmed by Delora Fuel ([id number]) on 10/25/2021 12:29:06 AM  I have reviewed the labs performed to date as well as medications administered while in observation.  Recent changes in the last 24 hours include none.  Plan  Current plan is for social worker assistance in arrangement of safe disposition.    Gareth Morgan, MD 01/08/22 1105
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
## 2022-01-09 MED ORDER — ATENOLOL 25 MG PO TABS
25.0000 mg | ORAL_TABLET | Freq: Every day | ORAL | Status: DC
  Administered 2022-01-09 – 2022-01-31 (×23): 25 mg via ORAL
  Filled 2022-01-09 (×25): qty 1

## 2022-01-09 NOTE — ED Notes (Signed)
Nadim verbalized feeling remorse for his recent behavior and has repeatedly apologized to this Probation officer. He was encourage to remain focused on maintaining positive interactions with staff and peer and less focus on his inappropriate behaviors.

## 2022-01-09 NOTE — ED Notes (Signed)
Remains asleep with no signs of sleep disturbance or  distress.
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
## 2022-01-09 NOTE — ED Notes (Signed)
Peter Parsons is currently sleeping with no distress noted. Positive raise and fall of chest noted skin color appears appropriate for his ethnicity sleep undisturbed.

## 2022-01-09 NOTE — ED Provider Notes (Signed)
Emergency Medicine Observation Re-evaluation Note  Peter Parsons is a 61 y.o. male, seen on rounds today.  Pt initially presented to the ED for complaints of Psychiatric Evaluation and Anxiety Currently, the patient is resting.  Physical Exam  BP 130/76 (BP Location: Right Arm)   Pulse 65   Temp 97.8 F (36.6 C) (Oral)   Resp 16   Ht '5\' 10"'$  (1.778 m)   Wt 89.8 kg   SpO2 97%   BMI 28.41 kg/m  Physical Exam General: nad Psych: anxious  ED Course / MDM  EKG:EKG Interpretation  Date/Time:  Wednesday October 24 2021 05:46:01 EDT Ventricular Rate:  74 PR Interval:  136 QRS Duration: 144 QT Interval:  408 QTC Calculation: 453 R Axis:   -75 Text Interpretation: Sinus rhythm RBBB and LAFB When compared with ECG of 05/14/2019, Right bundle branch block is now present Confirmed by Delora Fuel ([id number]) on 10/25/2021 12:29:06 AM  I have reviewed the labs performed to date as well as medications administered while in observation.  Recent changes in the last 24 hours include no change.  Plan  Current plan is for placement.    Jeanell Sparrow, DO 01/09/22 1118

## 2022-01-09 NOTE — ED Notes (Signed)
Pt anxious and stating he "did something wrong."  Pt will not verbalize what he did.  Pt reassured and redirected.

## 2022-01-10 LAB — CBC WITH DIFFERENTIAL/PLATELET
Abs Immature Granulocytes: 0.02 10*3/uL (ref 0.00–0.07)
Basophils Absolute: 0 10*3/uL (ref 0.0–0.1)
Basophils Relative: 1 %
Eosinophils Absolute: 0 10*3/uL (ref 0.0–0.5)
Eosinophils Relative: 0 %
HCT: 38.4 % — ABNORMAL LOW (ref 39.0–52.0)
Hemoglobin: 13.3 g/dL (ref 13.0–17.0)
Immature Granulocytes: 0 %
Lymphocytes Relative: 40 %
Lymphs Abs: 2.4 10*3/uL (ref 0.7–4.0)
MCH: 30.2 pg (ref 26.0–34.0)
MCHC: 34.6 g/dL (ref 30.0–36.0)
MCV: 87.3 fL (ref 80.0–100.0)
Monocytes Absolute: 0.5 10*3/uL (ref 0.1–1.0)
Monocytes Relative: 8 %
Neutro Abs: 3.1 10*3/uL (ref 1.7–7.7)
Neutrophils Relative %: 51 %
Platelets: 153 10*3/uL (ref 150–400)
RBC: 4.4 MIL/uL (ref 4.22–5.81)
RDW: 14.1 % (ref 11.5–15.5)
WBC: 6.1 10*3/uL (ref 4.0–10.5)
nRBC: 0 % (ref 0.0–0.2)

## 2022-01-10 NOTE — ED Notes (Signed)
Patient alert this shift. Patient cooperative with care and redirection.  Patient medication compliant.  Patient anxious and worried. Confusion noted.  Patient reassured and redirected. No aggression or agitation noted.

## 2022-01-10 NOTE — ED Notes (Addendum)
Peter Parsons is currently lying in bed watching television affect bright mood stable. Peter Parsons  states " I got good news they think that they have found a place for me to live in [place]" .  Peter Parsons states he is very happy because he has family that live in [place].

## 2022-01-10 NOTE — ED Provider Notes (Signed)
Emergency Medicine Observation Re-evaluation Note  Peter Parsons is a 61 y.o. male, seen on rounds today.  Pt initially presented to the ED for complaints of Psychiatric Evaluation and Anxiety Currently, the patient is resting.  Physical Exam  BP 97/61 (BP Location: Left Arm)   Pulse 60   Temp 97.6 F (36.4 C) (Oral)   Resp 16   Ht '5\' 10"'$  (1.778 m)   Wt 89.8 kg   SpO2 96%   BMI 28.41 kg/m  Physical Exam General: Appears to be resting comfortably in bed, no acute distress. Cardiac: Regular rate, normal heart rate, non-emergent blood pressure for this morning's vitals. Lungs: No increased work of breathing.  Equal chest rise appreciated Psych: Calm, asleep in bed.   ED Course / MDM  EKG:EKG Interpretation  Date/Time:  Wednesday October 24 2021 05:46:01 EDT Ventricular Rate:  74 PR Interval:  136 QRS Duration: 144 QT Interval:  408 QTC Calculation: 453 R Axis:   -75 Text Interpretation: Sinus rhythm RBBB and LAFB When compared with ECG of 05/14/2019, Right bundle branch block is now present Confirmed by Delora Fuel ([id number]) on 10/25/2021 12:29:06 AM  I have reviewed the labs performed to date as well as medications administered while in observation.    Plan  Current plan is for placement.    Tretha Sciara, MD 01/10/22 [phone]

## 2022-01-10 NOTE — Progress Notes (Signed)
Snack given peanutbutter and crackers

## 2022-01-10 NOTE — Progress Notes (Signed)
During measurement of pt vitals pt states "I've been thinking about committing suicide" This NT asked pt why and what was his plan. Pt states "I'm going to hold my breath, they took away my playmate, all I want to do is play monopoly." RN notified by this NT

## 2022-01-10 NOTE — Progress Notes (Signed)
REMS Clozapine program Update:   Patient & MD registered; Labs entered and acceptable.  - 11/30 ANC: 3.1 K/uL    Per Clozapine REMS program, acceptable to continue medication. [place] monitoring required weekly  Dia Sitter, PharmD, BCPS 01/10/2022 10:21 AM

## 2022-01-10 NOTE — ED Notes (Signed)
Peter Parsons is currently asleep no signs of distress noted pt requested condom cath prior to bed time.

## 2022-01-11 NOTE — ED Notes (Signed)
Pt received meal tray. 

## 2022-01-11 NOTE — ED Notes (Signed)
Pt was given a cup with coffee with cream and sugar. Pt is coloring with some crayons.

## 2022-01-11 NOTE — ED Notes (Signed)
Pt was given crackers, peanut butter and a sprite with ice.

## 2022-01-11 NOTE — ED Notes (Addendum)
Pt given a sprite and breakfast sandwich.

## 2022-01-11 NOTE — ED Notes (Signed)
Currently sleeping no distress or disturbed sleep patterns noted respirations are easy skin color appropriate for ethnicity.

## 2022-01-11 NOTE — ED Notes (Signed)
Pt given his lunch tray.

## 2022-01-11 NOTE — ED Notes (Signed)
Pt calm and cooperative today.

## 2022-01-11 NOTE — ED Notes (Signed)
Pt was given his meal tray

## 2022-01-11 NOTE — ED Notes (Addendum)
Pt requested a shower. Also, changed linen and gave him a new pair of scrubs.

## 2022-01-11 NOTE — ED Notes (Signed)
Lying in bed watching television no distress noted denies complaints.

## 2022-01-11 NOTE — ED Provider Notes (Signed)
Emergency Medicine Observation Re-evaluation Note  Peter Parsons is a 61 y.o. male, seen on rounds today.  Pt initially presented to the ED for complaints of Psychiatric Evaluation and Anxiety Currently, the patient is asleep.  Physical Exam  BP 113/63 (BP Location: Left Arm)   Pulse (!) 57   Temp 98.5 F (36.9 C) (Axillary)   Resp 15   Ht '5\' 10"'$  (1.778 m)   Wt 89.8 kg   SpO2 99%   BMI 28.41 kg/m  Physical Exam General: asleep Cardiac: rr Lungs: clear Psych: calm  ED Course / MDM  EKG:EKG Interpretation  Date/Time:  Wednesday October 24 2021 05:46:01 EDT Ventricular Rate:  74 PR Interval:  136 QRS Duration: 144 QT Interval:  408 QTC Calculation: 453 R Axis:   -75 Text Interpretation: Sinus rhythm RBBB and LAFB When compared with ECG of 05/14/2019, Right bundle branch block is now present Confirmed by Delora Fuel ([id number]) on 10/25/2021 12:29:06 AM  I have reviewed the labs performed to date as well as medications administered while in observation.  Recent changes in the last 24 hours include none.  Plan  Current plan is for awaiting placement.    Isla Pence, MD 01/11/22 [phone]

## 2022-01-12 MED ORDER — IBUPROFEN 200 MG PO TABS
600.0000 mg | ORAL_TABLET | Freq: Once | ORAL | Status: AC
  Administered 2022-01-12: 600 mg via ORAL
  Filled 2022-01-12: qty 3

## 2022-01-12 NOTE — ED Provider Notes (Signed)
Emergency Medicine Observation Re-evaluation Note  Azhar Knope is a 61 y.o. male, seen on rounds today.  Pt initially presented to the ED for complaints of Psychiatric Evaluation and Anxiety Currently, the patient is sitting comfortably.  Physical Exam  BP (!) 151/83 (BP Location: Left Arm)   Pulse 75   Temp 98.6 F (37 C) (Oral)   Resp 17   Ht '5\' 10"'$  (1.778 m)   Wt 89.8 kg   SpO2 98%   BMI 28.41 kg/m  Physical Exam General: sitting comfortably Cardiac: well-perfused Lungs: no increased WOB Psych: calm  ED Course / MDM  EKG:EKG Interpretation  Date/Time:  Wednesday October 24 2021 05:46:01 EDT Ventricular Rate:  74 PR Interval:  136 QRS Duration: 144 QT Interval:  408 QTC Calculation: 453 R Axis:   -75 Text Interpretation: Sinus rhythm RBBB and LAFB When compared with ECG of 05/14/2019, Right bundle branch block is now present Confirmed by Delora Fuel ([id number]) on 10/25/2021 12:29:06 AM  I have reviewed the labs performed to date as well as medications administered while in observation.  Recent changes in the last 24 hours include none.  Plan  Current plan is for awaiting placement.      Audley Hose, MD 01/12/22 [phone]

## 2022-01-12 NOTE — ED Notes (Signed)
Peter Parsons remains asleep with no signs of distress or disturbed sleep patterns. Respirations appear easy as evidence by the rise and fall of his chest and his skin color is appropriate for his ethnicity.

## 2022-01-13 NOTE — ED Provider Notes (Signed)
Emergency Medicine Observation Re-evaluation Note  Peter Parsons is a 61 y.o. male, seen on rounds today.  Pt initially presented to the ED for complaints of Psychiatric Evaluation and Anxiety Currently, the patient is placement.  Physical Exam  BP (!) 151/83 (BP Location: Left Arm)   Pulse 75   Temp 98.6 F (37 C) (Oral)   Resp 17   Ht 1.778 m ('5\' 10"'$ )   Wt 89.8 kg   SpO2 98%   BMI 28.41 kg/m  Physical Exam   ED Course / MDM  EKG:EKG Interpretation  Date/Time:  Wednesday October 24 2021 05:46:01 EDT Ventricular Rate:  74 PR Interval:  136 QRS Duration: 144 QT Interval:  408 QTC Calculation: 453 R Axis:   -75 Text Interpretation: Sinus rhythm RBBB and LAFB When compared with ECG of 05/14/2019, Right bundle branch block is now present Confirmed by Delora Fuel ([id number]) on 10/25/2021 12:29:06 AM  I have reviewed the labs performed to date as well as medications administered while in observation.  Recent changes in the last 24 hours include none.  Plan  Current plan is for placement.    Lacretia Leigh, MD 01/13/22 [phone]

## 2022-01-13 NOTE — ED Notes (Signed)
Patient reports increased anxiety and "being afraid."  Patient medicated per PRN order.

## 2022-01-13 NOTE — ED Notes (Signed)
Peter Parsons remains asleep no signs of distress are noted and his skin color is WNL for his ethnicity.

## 2022-01-13 NOTE — Progress Notes (Addendum)
Per Corey Skains, TOC CSW note on 01/08/2022, "TOC supervisor, Beverlee Nims, mentioned possible Independent Living and the legal guardians main concern is the history of eloping. [name], legal guardian, plans to follow up with Rosalee Kaufman at [facility] to inquire about an opening and possible referral there."  That's the most current information to report on pt.  Kenshawn Maciolek Tarpley-Carter, MSW, LCSW-A Pronouns:  She/Her/Hers [facility] Care Clinical Social Worker Direct Number:  [phone] [email or web address]

## 2022-01-13 NOTE — ED Notes (Signed)
Patient alert to eat.  Patient quiet. Patient medication compliant.  Patient cooperative.  Patient laying back down.

## 2022-01-13 NOTE — ED Notes (Signed)
Peter Parsons is currently sleeping without distress respirations are easy and sleep patterns are undisturbed.

## 2022-01-14 LAB — RESP PANEL BY RT-PCR (FLU A&B, COVID) ARPGX2
Influenza A by PCR: NEGATIVE
Influenza B by PCR: NEGATIVE
SARS Coronavirus 2 by RT PCR: NEGATIVE

## 2022-01-14 MED ORDER — TUBERCULIN PPD 5 UNIT/0.1ML ID SOLN
5.0000 [IU] | INTRADERMAL | Status: AC
  Administered 2022-01-14: 5 [IU] via INTRADERMAL
  Filled 2022-01-14: qty 0.1

## 2022-01-14 NOTE — ED Provider Notes (Addendum)
  Physical Exam  BP 107/63 (BP Location: Right Arm)   Pulse (!) 59   Temp 98.2 F (36.8 C) (Oral)   Resp 16   Ht '5\' 10"'$  (1.778 m)   Wt 89.8 kg   SpO2 96%   BMI 28.41 kg/m   Physical Exam  Procedures  Procedures  ED Course / MDM    Medical Decision Making Risk OTC drugs. Prescription drug management.   Pending placement.  No issues overnight.  Independent living versus other group home.  However has history of eloping.  There is now potential placement.  Requesting new FL 2 which was signed.  Requesting new COVID test which was ordered and requesting TB skin test which was ordered and should be placed today and should be able to be read on Wednesday.       Davonna Belling, MD 01/14/22 [id number]    Davonna Belling, MD 01/14/22 1153

## 2022-01-14 NOTE — Progress Notes (Signed)
CM spoke with Angie Peedin [phone]) with [facility] in [place].  Angie requests updated FL2, physician notes, covid test and TB test for potential placement.  Secure email sent with FL2, notes, and Covid test results.  Per provider notes, TB test has been administered and will be read on Wednesday at which time test results will need to be forwarded to Angie with [facility].  TOC will continue to follow.

## 2022-01-14 NOTE — ED Notes (Signed)
Remains focused on being discharged wrote six page note to this writer about plans after leaving hospital. States he was to get back into church and get saved. He further states he wants to move with his family and get a job. The note willk be placed in his chart.

## 2022-01-14 NOTE — ED Notes (Signed)
Patient currently resting quietly with eyes closed.  Respirations even and unlabored

## 2022-01-14 NOTE — ED Notes (Signed)
Newel is currently asleep with no signs of distress or disturbed sleep patterns noted respirations are easy.

## 2022-01-14 NOTE — NC FL2 (Signed)
[place] LEVEL OF CARE FORM     IDENTIFICATION  Patient Name: Peter Parsons Birthdate: January 22, 1961 Sex: male Admission Date (Current Location): 10/23/2021  [place] and [place] Number:  [name] [id number] [place] and Address:  [facility],  [address], [place]      Provider Number: [id number]  Attending Physician Name and Address:  Default, Provider, MD  Relative Name and Phone Number:  Legal Guardian [name] [phone]    Current Level of Care: Hospital Recommended Level of Care: Assisted Living Facility Prior Approval Number:    Date Approved/Denied:   PASRR Number:    Discharge Plan: Other (Comment) (ALF)    Current Diagnoses: Patient Active Problem List   Diagnosis Date Noted   Enterococcal bacteremia 05/15/2019   Hypertension 05/15/2019   Type 2 diabetes mellitus ([place]) 05/15/2019   Dyslipidemia 05/15/2019   BPH (benign prostatic hyperplasia) 05/15/2019   Normocytic anemia 05/15/2019   Thrombocytopenia ([place]) 05/15/2019   Acute urinary retention 05/15/2019   Urethral trauma 05/15/2019   UTI (urinary tract infection) 05/15/2019   Pressure injury of skin 21/19/4174   Acute metabolic encephalopathy 09/24/4816   Protein-calorie malnutrition, severe 03/29/2019   Weakness 03/27/2019   Dementia ([place]) 03/19/2019   Altered mental status    Paranoid schizophrenia ([place])     Orientation RESPIRATION BLADDER Height & Weight     Self, Time, Situation, Place  Normal Continent Weight: 89.8 kg Height:  '5\' 10"'$  (177.8 cm)  BEHAVIORAL SYMPTOMS/MOOD NEUROLOGICAL BOWEL NUTRITION STATUS      Continent Diet (Regular)  AMBULATORY STATUS COMMUNICATION OF NEEDS Skin   Independent Verbally Normal                       Personal Care Assistance Level of Assistance  Bathing, Feeding, Dressing Bathing Assistance: Limited assistance Feeding assistance: Independent Dressing Assistance: Independent     Functional Limitations  Info  Sight, Hearing, Speech Sight Info: Adequate Hearing Info: Adequate Speech Info: Adequate    SPECIAL CARE FACTORS FREQUENCY                       Contractures Contractures Info: Not present    Additional Factors Info  Code Status, Allergies Code Status Info: Full Allergies Info: acetaminophen Psychotropic Info: clozaril, ativan, zoloft         Current Medications (01/14/2022):  This is the current hospital active medication list Current Facility-Administered Medications  Medication Dose Route Frequency Provider Last Rate Last Admin   atenolol (TENORMIN) tablet 25 mg  25 mg Oral QHS Wynona Dove A, DO   25 mg at 01/13/22 2207   cloZAPine (CLOZARIL) tablet 300 mg  300 mg Oral QHS Lindon Romp A, NP   300 mg at 01/13/22 2207   gabapentin (NEURONTIN) capsule 100 mg  100 mg Oral BID Lindon Romp A, NP   100 mg at 01/14/22 1024   LORazepam (ATIVAN) tablet 2 mg  2 mg Oral BID Lindon Romp A, NP   2 mg at 01/14/22 1023   LORazepam (ATIVAN) tablet 2 mg  2 mg Oral Daily PRN Sherwood Gambler, MD   2 mg at 01/13/22 2007   nicotine (NICODERM CQ - dosed in mg/24 hours) patch 14 mg  14 mg Transdermal Daily Godfrey Pick, MD   14 mg at 01/14/22 1025   sertraline (ZOLOFT) tablet 200 mg  200 mg Oral Daily Lindon Romp A, NP   200 mg at 01/14/22 1024  tamsulosin (FLOMAX) capsule 0.4 mg  0.4 mg Oral Daily Milton Ferguson, MD   0.4 mg at 01/14/22 1024   Current Outpatient Medications  Medication Sig Dispense Refill   ARIPIPRAZOLE IM Inject into the muscle every 30 (thirty) days.     atenolol (TENORMIN) 25 MG tablet Take 25 mg by mouth at bedtime.     cloZAPine (CLOZARIL) 100 MG tablet Take 300 mg by mouth at bedtime.     ibuprofen (ADVIL) 200 MG tablet Take 1 tablet (200 mg total) by mouth every 6 (six) hours as needed for fever, headache or moderate pain. 30 tablet 0   LORazepam (ATIVAN) 0.5 MG tablet Take 1.5 mg by mouth 2 (two) times daily.     LORazepam (ATIVAN) 2 MG tablet Take 2 mg  by mouth daily.     nystatin cream (MYCOSTATIN) Apply topically 2 (two) times daily. 30 g 0   polyethylene glycol (MIRALAX / GLYCOLAX) 17 g packet Take 17 g by mouth 2 (two) times daily. 14 each 0   pravastatin (PRAVACHOL) 40 MG tablet Take 40 mg by mouth daily.     senna (SENOKOT) 8.6 MG TABS tablet Take 4 tablets by mouth 2 (two) times daily.     senna-docusate (SENOKOT-S) 8.6-50 MG tablet Take 2 tablets by mouth 2 (two) times daily. 60 tablet 0   sertraline (ZOLOFT) 100 MG tablet Take 200 mg by mouth daily.     tamsulosin (FLOMAX) 0.4 MG CAPS capsule Take 0.4 mg by mouth daily.     chlorhexidine (PERIDEX) 0.12 % solution Use as directed 15 mLs in the mouth or throat 2 (two) times daily. (Patient not taking: Reported on 10/23/2021) 120 mL 0   clonazePAM (KLONOPIN) 0.5 MG tablet Take 0.5 tablets (0.25 mg total) by mouth 3 (three) times daily as needed (anxiety). (Patient not taking: Reported on 10/23/2021) 30 tablet 0   escitalopram (LEXAPRO) 10 MG tablet Take 1 tablet (10 mg total) by mouth daily. (Patient not taking: Reported on 10/23/2021) 30 tablet 0   traZODone (DESYREL) 50 MG tablet Take 1 tablet (50 mg total) by mouth at bedtime. (Patient not taking: Reported on 10/23/2021) 30 tablet 0   ziprasidone (GEODON) 40 MG capsule Take 1 capsule (40 mg total) by mouth daily at 8 pm. (Patient not taking: Reported on 10/23/2021) 30 capsule 0   ziprasidone (GEODON) 60 MG capsule Take 1 capsule (60 mg total) by mouth daily at 8 pm. (Patient not taking: Reported on 10/23/2021) 30 capsule 0     Discharge Medications: Please see discharge summary for a list of discharge medications.  Relevant Imaging Results:  Relevant Lab Results:   Additional Information SSN: [id number]  Joaquin Courts, RN

## 2022-01-15 MED ORDER — GABAPENTIN 100 MG PO CAPS: 100.0000 mg | ORAL_CAPSULE | Freq: Two times a day (BID) | ORAL | 0 refills | Status: DC

## 2022-01-15 MED ORDER — TAMSULOSIN HCL 0.4 MG PO CAPS: 0.4000 mg | ORAL_CAPSULE | Freq: Every day | ORAL | 0 refills | Status: DC

## 2022-01-15 MED ORDER — LORAZEPAM 2 MG PO TABS: 2.0000 mg | ORAL_TABLET | Freq: Every day | ORAL | 0 refills | Status: DC | PRN

## 2022-01-15 MED ORDER — LORAZEPAM 2 MG PO TABS: 2.0000 mg | ORAL_TABLET | Freq: Two times a day (BID) | ORAL | 0 refills | Status: DC

## 2022-01-15 MED ORDER — ABILIFY MAINTENA 300 MG IM PRSY: 400.0000 mg | PREFILLED_SYRINGE | INTRAMUSCULAR | 0 refills | Status: DC

## 2022-01-15 MED ORDER — ATENOLOL 25 MG PO TABS: 25.0000 mg | ORAL_TABLET | Freq: Every day | ORAL | 0 refills | Status: DC

## 2022-01-15 MED ORDER — CLOZAPINE 100 MG PO TABS: 300.0000 mg | ORAL_TABLET | Freq: Every day | ORAL | 0 refills | Status: DC

## 2022-01-15 MED ORDER — NICOTINE 14 MG/24HR TD PT24: 14.0000 mg | MEDICATED_PATCH | Freq: Every day | TRANSDERMAL | 0 refills | Status: DC

## 2022-01-15 MED ORDER — SERTRALINE HCL 100 MG PO TABS: 200.0000 mg | ORAL_TABLET | Freq: Every day | ORAL | 0 refills | Status: DC

## 2022-01-15 MED ORDER — PRAVASTATIN SODIUM 40 MG PO TABS: 40.0000 mg | ORAL_TABLET | Freq: Every day | ORAL | 0 refills | Status: DC

## 2022-01-15 NOTE — ED Provider Notes (Addendum)
Emergency Medicine Observation Re-evaluation Note  Peter Parsons is a 61 y.o. male, seen on rounds today.  Pt initially presented to the ED for complaints of Psychiatric Evaluation and Anxiety Currently, the patient is resting.  Physical Exam  BP 129/74 (BP Location: Left Arm)   Pulse 63   Temp 97.9 F (36.6 C) (Oral)   Resp 18   Ht 1.778 m ('5\' 10"'$ )   Wt 89.8 kg   SpO2 96%   BMI 28.41 kg/m  Physical Exam General: Sleeping, no acute distress  ED Course / MDM  EKG:EKG Interpretation  Date/Time:  Wednesday October 24 2021 05:46:01 EDT Ventricular Rate:  74 PR Interval:  136 QRS Duration: 144 QT Interval:  408 QTC Calculation: 453 R Axis:   -75 Text Interpretation: Sinus rhythm RBBB and LAFB When compared with ECG of 05/14/2019, Right bundle branch block is now present Confirmed by Delora Fuel ([id number]) on 10/25/2021 12:29:06 AM  I have reviewed the labs performed to date as well as medications administered while in observation.  Recent changes in the last 24 hours include no acute changes.  Plan  Current plan is for possible skilled nursing facility placement.  Patient had a TB skin test placed.  Will be read on Wednesday    Dorie Rank, MD 01/15/22 [phone]   home meds reviewed with pharmacy.  Outpatient prescriptions sent to express care pharmacy in [facility]   Dorie Rank, [place] 01/15/22 [phone]

## 2022-01-15 NOTE — ED Notes (Signed)
Pt reports feeling "scared and nervous", requesting anxiety medication

## 2022-01-15 NOTE — Progress Notes (Addendum)
CM spoke with [facility], who report that they will need all prescriptions sent to [place] in [facility] (fax number [phone]).  MD notified and noted all meds have been e-scribed to the pharmacy.

## 2022-01-15 NOTE — Progress Notes (Signed)
CM, CSW, LG and CC met for a care team meeting.  Provided updates on status of placement with [facility], at this time pending TB skin test results.  Discussed with care team the need for outpatient psych follow-up appointment for patients long acting injection administration.  CC reports she has access to list of psych providers in the [place] area, LG reports he would like to manage scheduling the appointment.  CC to provide list of providers to [facility], who reports he will schedule the follow up appointment and notify the care team.  TOC continues to follow.

## 2022-01-15 NOTE — ED Notes (Signed)
Sleeping with no signs of distress or sleep disturbance.

## 2022-01-15 NOTE — ED Notes (Signed)
Resting well with no sleep disturbance or distressed sleep patterns noted

## 2022-01-16 MED ORDER — ARIPIPRAZOLE ER 400 MG IM PRSY
400.0000 mg | PREFILLED_SYRINGE | Freq: Once | INTRAMUSCULAR | Status: AC
  Administered 2022-01-16: 400 mg via INTRAMUSCULAR
  Filled 2022-01-16: qty 400

## 2022-01-16 NOTE — ED Provider Notes (Signed)
Emergency Medicine Observation Re-evaluation Note  Peter Parsons is a 61 y.o. male, seen on rounds today.  Pt initially presented to the ED for complaints of Psychiatric Evaluation and Anxiety Currently, the patient is awake and alert.  Pt has been accepted [facility].  Meds sent to the pharmacy yesterday.TB test placed on Tuesday the 4th.  Nurse to put results in chart.  Physical Exam  BP (!) 110/59 (BP Location: Right Arm)   Pulse (!) 59   Temp 97.6 F (36.4 C) (Oral)   Resp 18   Ht '5\' 10"'$  (1.778 m)   Wt 89.8 kg   SpO2 95%   BMI 28.41 kg/m  Physical Exam General: awake Cardiac: rr Lungs: clear Psych: calm  ED Course / MDM  EKG:EKG Interpretation  Date/Time:  Wednesday October 24 2021 05:46:01 EDT Ventricular Rate:  74 PR Interval:  136 QRS Duration: 144 QT Interval:  408 QTC Calculation: 453 R Axis:   -75 Text Interpretation: Sinus rhythm RBBB and LAFB When compared with ECG of 05/14/2019, Right bundle branch block is now present Confirmed by Delora Fuel ([id number]) on 10/25/2021 12:29:06 AM  I have reviewed the labs performed to date as well as medications administered while in observation.  Recent changes in the last 24 hours include acceptance to a group home.  Plan  Current plan is for group home tomorrow.    Isla Pence, MD 01/16/22 [phone]

## 2022-01-16 NOTE — Progress Notes (Addendum)
Please do not share any information with the pt at this time.    This CSW attempted to contact [facility] to schedule the pt's outpatient follow up appointment to receive his long acting injection once at placement.   [facility] [employer] [phone] option 3 and 5  Addend @ 10:14 AM  This CSW spoke with [place] at [facility] who informed that the pt would need to be active in therapy in order to receive medication management. Debarah Crape advised this CSW to submit a referral online through their website. This CSW informed [name], legal guardian, and Larene Beach, [place] at [facility], via email of information obtained and plan to submit referral. This CSW attempted to contact [name], legal guardian, twice via phone before sending an email. Referral was submitted online and legal guardian's information was provided. Per Debarah Crape, the referral coordinator will reach out to myself and the legal guardian.   This CSW contacted [facility] to inquire about the process for the pt to receive his long acting injection prior to discharge due to the group home not being able to administer the injection. This CSW spoke with Jerene Pitch at the pharmacy who informed that the order would need to be placed today by 3:30 PM in order for the pt to receive the injection on tomorrow. This CSW reached out to EDP's Dr. Gilford Raid and Dr. Francia Greaves via secure chat. Dr. Gilford Raid will order the [place].   Addend @ 10:22 AM Per Dr. Gilford Raid, she has asked the pysch provider to place the order for the [place]. Psych NP, Lindon Romp has placed the order.   Addend @ 11:28 AM This CSW received a call back from [facility] and was informed they are at capacity for their basic services but expecting availability in January. This CSW has communicated this information to [employer], Legal Guardian.  [name], Legal Guardian,  has expressed concern for the pt's elopement history and reached out to the family care  home who states they cannot provide protective measures (wander guard and/or alarms) for just one patient. [name] is exploring with his supervisor if DSS can fund these purchases to ensure protective measures are in place. Larene Beach, [place] at [facility], is speaking with her leadership team to see what supports they can provide in the event DSS cannot pay for the wander guard and alarms.

## 2022-01-16 NOTE — ED Notes (Signed)
TB PPD result NEGATIVE today 01/16/2022

## 2022-01-16 NOTE — Progress Notes (Addendum)
Please do not share any information with the pt at this time.    This CSW spoke with legal guardian who reported that Peter Parsons is in [place], [place]. [name] reports he can transport the pt to whichever placement he goes to. [name] has spoken with the group home owner, [facility], who he says should let us know if he will accept the pt by Friday. If accepted to [facility], [name] is choosing not to move forward with [facility] because he would not be able to complete the day program if so.   This CSW has received the link to the virtual meet and greet on 01/17/22 at 1:30 PM.

## 2022-01-16 NOTE — Progress Notes (Signed)
Patient has been boarding in the ED pending group home placement. Patient has been accepted to a group home and with plans to discharge on 01/17/2022. Patient last received abilify maintena 400 mg on 12/17/2021. He will need to receive an injection prior to discharge. Placed order for abilify maintena 400 mg IM.

## 2022-01-16 NOTE — Progress Notes (Addendum)
Please do not share any information with the pt at this time.   [name], legal guardian, has expressed concerns about [facility] not having protective measures to prevent elopement. The pt has a history of elopement and [name] and his supervisor is concerned that the pt may elope from the placement without wander guards and alarms. [name] reports he has had a conversation with the administrator at [facility] and she has stated they do not have the accommodations [name] is looking for.   Per [name], legal guardian: "My management team is hard pressed on getting [facility] the Group Living pot of money so that he can have that extra supervision which he will only get in a group home setting. They are recommending that I follow through with [facility] - [facility]- since they are already set up with [facility], and they have the resources and staff to alleviate the risk of eloping. I would like to give [facility] the chance to accept or deny, and then return to Angie's facility as our only other option."  This CSW has requested that [name] inform us of the date that [facility] is available to do a virtual meet & greet. TOC following for updates.   Addend @ 1:28 PM This CSW has informed [facility] of continued efforts to place him. Eric inquired specifically about the placement in [place] [facility]) and this CSW informed that we are exploring other options. Roald stated he had attempted to contact [name], legal guardian, but unsuccessful. He reported not being able to leave a voicemail. This CSW has asked [name] via email to contact [facility] to inform of her efforts.   [name] reported he and [facility] has chosen 1:30 PM on 01/17/22 to do virtual meet and greet. This CSW inquired about the platform and a hyperlink to the meeting.

## 2022-01-17 LAB — CBC WITH DIFFERENTIAL/PLATELET
Abs Immature Granulocytes: 0.02 10*3/uL (ref 0.00–0.07)
Basophils Absolute: 0 10*3/uL (ref 0.0–0.1)
Basophils Relative: 1 %
Eosinophils Absolute: 0 10*3/uL (ref 0.0–0.5)
Eosinophils Relative: 0 %
HCT: 41.9 % (ref 39.0–52.0)
Hemoglobin: 14.1 g/dL (ref 13.0–17.0)
Immature Granulocytes: 0 %
Lymphocytes Relative: 17 %
Lymphs Abs: 1.2 10*3/uL (ref 0.7–4.0)
MCH: 29.6 pg (ref 26.0–34.0)
MCHC: 33.7 g/dL (ref 30.0–36.0)
MCV: 87.8 fL (ref 80.0–100.0)
Monocytes Absolute: 0.5 10*3/uL (ref 0.1–1.0)
Monocytes Relative: 7 %
Neutro Abs: 5.2 10*3/uL (ref 1.7–7.7)
Neutrophils Relative %: 75 %
Platelets: 142 10*3/uL — ABNORMAL LOW (ref 150–400)
RBC: 4.77 MIL/uL (ref 4.22–5.81)
RDW: 14.4 % (ref 11.5–15.5)
WBC: 7.1 10*3/uL (ref 4.0–10.5)
nRBC: 0 % (ref 0.0–0.2)

## 2022-01-17 NOTE — Progress Notes (Addendum)
Meet and Greet today with [facility] at 1:30.    Addend @ 2:46 PM Meeting with [facility] went well and they have accepted the pt. The owner and administrator will work together to expedite authorization for services. Their plan is to set up outpatient appointments by Wednesday of next week. If all goes through, the pt can discharge to the home by the end of next week. The pt's legal guardian, [name], will transport the pt. TOC will message provider to e-scribe the pt's medications to [facility] in [place], [place].

## 2022-01-17 NOTE — ED Notes (Signed)
Peter Parsons remains asleep and no signs distress or sleep disturbance is noted respirations are easy skin color appropriate for his ethnicity.

## 2022-01-17 NOTE — ED Notes (Signed)
Call from [place]. For clarification on Abilify injection. Confirmed with Dr Zenia Resides - '400mg'$  Abilify injection to be given every 28 days.

## 2022-01-17 NOTE — Progress Notes (Signed)
REMS Clozapine program Update:   Patient & MD registered; Labs entered and acceptable.   ANC 5.2   Per Clozapine REMS program, acceptable to continue medication. [place] monitoring required weekly while inpatient.   Tawnya Crook, PharmD, BCPS Clinical Pharmacist 01/17/2022 3:38 PM

## 2022-01-17 NOTE — ED Provider Notes (Signed)
Emergency Medicine Observation Re-evaluation Note  Peter Parsons is a 61 y.o. male, seen on rounds today.  Pt initially presented to the ED for complaints of Psychiatric Evaluation and Anxiety Currently, the patient is resting.  No new changes.  Physical Exam  BP 130/78 (BP Location: Right Arm)   Pulse 73   Temp 98 F (36.7 C) (Oral)   Resp 18   Ht 1.778 m ('5\' 10"'$ )   Wt 89.8 kg   SpO2 97%   BMI 28.41 kg/m  Physical Exam   ED Course / MDM  EKG:EKG Interpretation  Date/Time:  Wednesday October 24 2021 05:46:01 EDT Ventricular Rate:  74 PR Interval:  136 QRS Duration: 144 QT Interval:  408 QTC Calculation: 453 R Axis:   -75 Text Interpretation: Sinus rhythm RBBB and LAFB When compared with ECG of 05/14/2019, Right bundle branch block is now present Confirmed by Delora Fuel ([id number]) on 10/25/2021 12:29:06 AM  I have reviewed the labs performed to date as well as medications administered while in observation.  Recent changes in the last 24 hours include nothing  Plan  Current plan is for placement.    Lacretia Leigh, MD 01/17/22 [phone]

## 2022-01-17 NOTE — ED Notes (Signed)
Peter Parsons reports feeling happy that he believes a group home has been found that will take him and promise that he will doo better than in the past. Affect bright mood happy and stable.

## 2022-01-18 NOTE — ED Provider Notes (Signed)
Emergency Medicine Observation Re-evaluation Note  Peter Parsons is a 61 y.o. male, seen on rounds today.  Pt initially presented to the ED for complaints of Psychiatric Evaluation and Anxiety Currently, the patient is awaiting psychiatric bed  Physical Exam  BP 127/82 (BP Location: Left Arm)   Pulse 64   Temp 97.7 F (36.5 C) (Oral)   Resp 18   Ht '5\' 10"'$  (1.778 m)   Wt 89.8 kg   SpO2 100%   BMI 28.41 kg/m  Physical Exam Alert in no acute distress ED Course / MDM  EKG:EKG Interpretation  Date/Time:  Wednesday October 24 2021 05:46:01 EDT Ventricular Rate:  74 PR Interval:  136 QRS Duration: 144 QT Interval:  408 QTC Calculation: 453 R Axis:   -75 Text Interpretation: Sinus rhythm RBBB and LAFB When compared with ECG of 05/14/2019, Right bundle branch block is now present Confirmed by Delora Fuel ([id number]) on 10/25/2021 12:29:06 AM  I have reviewed the labs performed to date as well as medications administered while in observation.  Recent changes in the last 24 hours include none.  Plan  Current plan is for psychiatric bed.    Milton Ferguson, MD 01/18/22 [phone]

## 2022-01-18 NOTE — ED Notes (Signed)
Pt expressed that they were anxious, pt given ativan

## 2022-01-19 NOTE — ED Provider Notes (Signed)
Emergency Medicine Observation Re-evaluation Note  Tricia Oaxaca is a 61 y.o. male, seen on rounds today.  Pt initially presented to the ED for complaints of Psychiatric Evaluation and Anxiety Currently, the patient is awaiting psychiatric bed.  Physical Exam  BP 124/84 (BP Location: Left Arm)   Pulse 73   Temp 97.8 F (36.6 C) (Oral)   Resp 20   Ht '5\' 10"'$  (1.778 m)   Wt 89.8 kg   SpO2 99%   BMI 28.41 kg/m  Physical Exam Alert and in no acute distress ED Course / MDM  EKG:EKG Interpretation  Date/Time:  Wednesday October 24 2021 05:46:01 EDT Ventricular Rate:  74 PR Interval:  136 QRS Duration: 144 QT Interval:  408 QTC Calculation: 453 R Axis:   -75 Text Interpretation: Sinus rhythm RBBB and LAFB When compared with ECG of 05/14/2019, Right bundle branch block is now present Confirmed by Delora Fuel ([id number]) on 10/25/2021 12:29:06 AM  I have reviewed the labs performed to date as well as medications administered while in observation.  Recent changes in the last 24 hours include none.  Plan  Current plan is for psychiatric admission.    Milton Ferguson, MD 01/19/22 1346

## 2022-01-19 NOTE — ED Notes (Signed)
Pt sleep throughout the night. No anxiety, confusion noted.

## 2022-01-20 NOTE — ED Notes (Signed)
Assumed care of pt. Pt sleeping. Pt in no apparent distress or pain. Sitter at the bedside. Will continue to monitor.

## 2022-01-20 NOTE — Progress Notes (Signed)
TOC to have EDP e-scribe the pt's medication to [facility] in [place], [place] on Monday so the provider can have them before the pt arrives by the end of the week.

## 2022-01-20 NOTE — Progress Notes (Signed)
Patient tends to wonder put of his room and to the desk often. He has been redirectable and otherwise cooperative.

## 2022-01-20 NOTE — Progress Notes (Signed)
Spiritual Care Note  Referred by nurse per Mr [facility] request because he was feeling discouraged about things he had "done wrong" and wanted to speak with a chaplain.  Visited in ED, providing pastoral presence, reflective listening, and prayer per request. Mr Starliper reported struggling with the mixed messages his mind gives him, particularly judgments about how he is good or bad. He verbalized wanting to be able to avoid such confusion. Mr Huntsman is also indicated that he feels lonely and values connection with other people, and states that he would welcome a follow-up visit from another chaplain prior to his transfer, which he anticipates on Thursday.  Will refer to day chaplains for follow-up.  On-call Chaplain Lorrin Jackson, [place], [facility] 24/7 pager: [phone]

## 2022-01-21 MED ORDER — SERTRALINE HCL 100 MG PO TABS: 200.0000 mg | ORAL_TABLET | Freq: Every day | ORAL | 0 refills | Status: DC

## 2022-01-21 MED ORDER — ABILIFY MAINTENA 300 MG IM PRSY: 400.0000 mg | PREFILLED_SYRINGE | INTRAMUSCULAR | 0 refills | Status: DC

## 2022-01-21 MED ORDER — PRAVASTATIN SODIUM 40 MG PO TABS: 40.0000 mg | ORAL_TABLET | Freq: Every day | ORAL | 0 refills | Status: DC

## 2022-01-21 MED ORDER — ATENOLOL 25 MG PO TABS: 25.0000 mg | ORAL_TABLET | Freq: Every day | ORAL | 0 refills | Status: DC

## 2022-01-21 MED ORDER — LORAZEPAM 2 MG PO TABS: 2.0000 mg | ORAL_TABLET | Freq: Two times a day (BID) | ORAL | 0 refills | Status: DC

## 2022-01-21 MED ORDER — CLOZAPINE 100 MG PO TABS: 300.0000 mg | ORAL_TABLET | Freq: Every day | ORAL | 0 refills | Status: DC

## 2022-01-21 MED ORDER — NICOTINE 14 MG/24HR TD PT24: 14.0000 mg | MEDICATED_PATCH | Freq: Every day | TRANSDERMAL | 0 refills | Status: DC

## 2022-01-21 MED ORDER — TAMSULOSIN HCL 0.4 MG PO CAPS: 0.4000 mg | ORAL_CAPSULE | Freq: Every day | ORAL | 0 refills | Status: DC

## 2022-01-21 MED ORDER — GABAPENTIN 100 MG PO CAPS: 100.0000 mg | ORAL_CAPSULE | Freq: Two times a day (BID) | ORAL | 0 refills | Status: DC

## 2022-01-21 NOTE — ED Provider Notes (Signed)
Emergency Medicine Observation Re-evaluation Note  Peter Parsons is a 61 y.o. male, seen on rounds today.  Pt initially presented to the ED for complaints of Psychiatric Evaluation and Anxiety Currently, the patient is none.  Physical Exam  BP 114/66 (BP Location: Left Arm)   Pulse 62   Temp 97.8 F (36.6 C) (Oral)   Resp 20   Ht 1.778 m ('5\' 10"'$ )   Wt 89.8 kg   SpO2 98%   BMI 28.41 kg/m  Physical Exam  ED Course / MDM  EKG:EKG Interpretation  Date/Time:  Wednesday October 24 2021 05:46:01 EDT Ventricular Rate:  74 PR Interval:  136 QRS Duration: 144 QT Interval:  408 QTC Calculation: 453 R Axis:   -75 Text Interpretation: Sinus rhythm RBBB and LAFB When compared with ECG of 05/14/2019, Right bundle branch block is now present Confirmed by Delora Fuel ([id number]) on 10/25/2021 12:29:06 AM  I have reviewed the labs performed to date as well as medications administered while in observation.  Recent changes in the last 24 hours include none.  Will attempt to send the patient's medications to her pharmacy.  Plan  Current plan is for discharge to facility likely next week.    Lacretia Leigh, MD 01/21/22 [phone]

## 2022-01-21 NOTE — Progress Notes (Signed)
This CSW requested that EDP, Dr. Zenia Resides e-scribe medications to [facility] in [place], [place]. This has been completed. This CSW will email a list of the pt's current medications to the group home owner and inform of the medications being sent to preferred pharmacy. TOC following for updates.

## 2022-01-21 NOTE — ED Notes (Signed)
Pt pacing back and forth, stating that he is nervous and worried because "I've done wrong by the law", expressing concerns that security officers/ law enforcement officers on the unit know what he has done. Pt concerns addressed, PRN medication given as requested.

## 2022-01-21 NOTE — ED Notes (Signed)
Pt calm and cooperative with pt care. Pt slept overnight.

## 2022-01-22 NOTE — ED Notes (Signed)
Pt frequently, persistently out to nurses station, "wants someone to talk to", persevering/ ruminating on past mistakes, verbalizes "being scared". Returned to room multiple times by sitter. Encouraged, reassured, boundaries and directions given. Redirectable temporarily.

## 2022-01-22 NOTE — ED Notes (Signed)
Patient has been writing inappropriate notes. Using nicotine patches to stick them on his window. For when [place] walks by and goes to the shower.   Note 1: "Peter Parsons I worship you I love kissing deep mouth"  Note 2: Peter Parsons you are my sexual soulmate, I long to suck your nipples. Let your chest hair grow out and I cum in you! "   Note 3:  I love you Peter Parsons. I love your cum. I need you!

## 2022-01-22 NOTE — ED Notes (Signed)
Notes from Andriy's room  1. "I worship you Peter Parsons. I want to lick your armpits".  2. "I adore you Peter Parsons xxoo".

## 2022-01-22 NOTE — ED Notes (Addendum)
Alert, NAD, calm.

## 2022-01-22 NOTE — Progress Notes (Signed)
CM met with Vaya and LG, who reports that the identified placement is expected to have all documents submitted for funding approval by Wednesday 12/13 after which Deloria Lair will need to review the records and approve funding for placement.  CC could not provide TOC with a time line for approval. TOC continuing to follow.

## 2022-01-22 NOTE — ED Provider Notes (Signed)
Emergency Medicine Observation Re-evaluation Note  Peter Parsons is a 61 y.o. male, seen on rounds today.  Pt initially presented to the ED for complaints of Psychiatric Evaluation and Anxiety Currently, the patient is awaiting placement.  Physical Exam  BP 115/77 (BP Location: Right Arm)   Pulse 65   Temp 98 F (36.7 C) (Oral)   Resp 18   Ht '5\' 10"'$  (1.778 m)   Wt 89.8 kg   SpO2 95%   BMI 28.41 kg/m  Physical Exam General: Calm Cardiac: Well perfused Lungs: Even respirations Psych: Calm  ED Course / MDM  EKG:EKG Interpretation  Date/Time:  Wednesday October 24 2021 05:46:01 EDT Ventricular Rate:  74 PR Interval:  136 QRS Duration: 144 QT Interval:  408 QTC Calculation: 453 R Axis:   -75 Text Interpretation: Sinus rhythm RBBB and LAFB When compared with ECG of 05/14/2019, Right bundle branch block is now present Confirmed by Delora Fuel ([id number]) on 10/25/2021 12:29:06 AM  I have reviewed the labs performed to date as well as medications administered while in observation.  Recent changes in the last 24 hours include notes to another ED patient found in patient's room as documented.   Plan  Current plan is for placement.    Margette Fast, MD 01/23/22 [phone]

## 2022-01-22 NOTE — ED Notes (Signed)
Sitter playing therapeutic card games/ conversation with pt

## 2022-01-23 LAB — SARS CORONAVIRUS 2 BY RT PCR: SARS Coronavirus 2 by RT PCR: NEGATIVE

## 2022-01-23 MED ORDER — IBUPROFEN 200 MG PO TABS
400.0000 mg | ORAL_TABLET | Freq: Four times a day (QID) | ORAL | Status: DC | PRN
  Administered 2022-01-23: 400 mg via ORAL
  Filled 2022-01-23: qty 2

## 2022-01-23 NOTE — ED Notes (Signed)
Peter Parsons is out of his room requesting his night time medications affect is brighter on approach mood appears stable and continues to be focused on discharge.

## 2022-01-23 NOTE — ED Provider Notes (Signed)
Emergency Medicine Observation Re-evaluation Note  Peter Parsons is a 61 y.o. male, seen on rounds today.  Pt initially presented to the ED for complaints of Psychiatric Evaluation and Anxiety Currently, the patient is calm.  Physical Exam  BP (!) 140/85 (BP Location: Right Arm)   Pulse 96   Temp 99.3 F (37.4 C) (Oral)   Resp 20   Ht '5\' 10"'$  (1.778 m)   Wt 89.8 kg   SpO2 97%   BMI 28.41 kg/m  Physical Exam General: awake   ED Course / MDM  EKG:EKG Interpretation  Date/Time:  Wednesday October 24 2021 05:46:01 EDT Ventricular Rate:  74 PR Interval:  136 QRS Duration: 144 QT Interval:  408 QTC Calculation: 453 R Axis:   -75 Text Interpretation: Sinus rhythm RBBB and LAFB When compared with ECG of 05/14/2019, Right bundle branch block is now present Confirmed by Delora Fuel ([id number]) on 10/25/2021 12:29:06 AM  I have reviewed the labs performed to date as well as medications administered while in observation.  Recent changes in the last 24 hours include nothing.  Plan  Current plan is for CM/SW placement.    Lennice Sites, DO 01/23/22 [phone]

## 2022-01-23 NOTE — ED Notes (Signed)
May remains asleep no distress or disturbed sleep noted positive rise and fall of chest skin color WNL.

## 2022-01-23 NOTE — ED Notes (Signed)
Currently sleeping with no signs of distress or sleep disturbance noted, respirations are easy as note by the rise and fall of his chest.

## 2022-01-24 ENCOUNTER — Encounter (HOSPITAL_COMMUNITY): Payer: Self-pay | Admitting: Internal Medicine

## 2022-01-24 DIAGNOSIS — N390 Urinary tract infection, site not specified: Secondary | ICD-10-CM | POA: Diagnosis not present

## 2022-01-24 DIAGNOSIS — L509 Urticaria, unspecified: Secondary | ICD-10-CM | POA: Diagnosis present

## 2022-01-24 DIAGNOSIS — N451 Epididymitis: Secondary | ICD-10-CM | POA: Diagnosis present

## 2022-01-24 DIAGNOSIS — N179 Acute kidney failure, unspecified: Secondary | ICD-10-CM | POA: Diagnosis present

## 2022-01-24 DIAGNOSIS — F200 Paranoid schizophrenia: Secondary | ICD-10-CM | POA: Diagnosis present

## 2022-01-24 DIAGNOSIS — E1165 Type 2 diabetes mellitus with hyperglycemia: Secondary | ICD-10-CM | POA: Diagnosis present

## 2022-01-24 DIAGNOSIS — T63451A Toxic effect of venom of hornets, accidental (unintentional), initial encounter: Secondary | ICD-10-CM | POA: Diagnosis not present

## 2022-01-24 DIAGNOSIS — D696 Thrombocytopenia, unspecified: Secondary | ICD-10-CM | POA: Diagnosis present

## 2022-01-24 DIAGNOSIS — I129 Hypertensive chronic kidney disease with stage 1 through stage 4 chronic kidney disease, or unspecified chronic kidney disease: Secondary | ICD-10-CM | POA: Diagnosis present

## 2022-01-24 DIAGNOSIS — N1831 Chronic kidney disease, stage 3a: Secondary | ICD-10-CM | POA: Diagnosis present

## 2022-01-24 DIAGNOSIS — N401 Enlarged prostate with lower urinary tract symptoms: Secondary | ICD-10-CM | POA: Diagnosis not present

## 2022-01-24 DIAGNOSIS — R440 Auditory hallucinations: Secondary | ICD-10-CM | POA: Diagnosis not present

## 2022-01-24 DIAGNOSIS — E870 Hyperosmolality and hypernatremia: Secondary | ICD-10-CM | POA: Diagnosis not present

## 2022-01-24 DIAGNOSIS — I4510 Unspecified right bundle-branch block: Secondary | ICD-10-CM | POA: Diagnosis present

## 2022-01-24 DIAGNOSIS — Z1152 Encounter for screening for COVID-19: Secondary | ICD-10-CM | POA: Diagnosis not present

## 2022-01-24 DIAGNOSIS — R45851 Suicidal ideations: Secondary | ICD-10-CM | POA: Diagnosis present

## 2022-01-24 DIAGNOSIS — E1122 Type 2 diabetes mellitus with diabetic chronic kidney disease: Secondary | ICD-10-CM | POA: Diagnosis present

## 2022-01-24 DIAGNOSIS — E871 Hypo-osmolality and hyponatremia: Secondary | ICD-10-CM | POA: Diagnosis present

## 2022-01-24 DIAGNOSIS — S40861A Insect bite (nonvenomous) of right upper arm, initial encounter: Secondary | ICD-10-CM | POA: Diagnosis present

## 2022-01-24 DIAGNOSIS — R338 Other retention of urine: Secondary | ICD-10-CM | POA: Diagnosis not present

## 2022-01-24 DIAGNOSIS — W57XXXA Bitten or stung by nonvenomous insect and other nonvenomous arthropods, initial encounter: Secondary | ICD-10-CM | POA: Diagnosis present

## 2022-01-24 DIAGNOSIS — F0390 Unspecified dementia without behavioral disturbance: Secondary | ICD-10-CM | POA: Diagnosis present

## 2022-01-24 DIAGNOSIS — D631 Anemia in chronic kidney disease: Secondary | ICD-10-CM | POA: Diagnosis present

## 2022-01-24 LAB — BASIC METABOLIC PANEL
Anion gap: 10 (ref 5–15)
BUN: 59 mg/dL — ABNORMAL HIGH (ref 8–23)
CO2: 26 mmol/L (ref 22–32)
Calcium: 8.6 mg/dL — ABNORMAL LOW (ref 8.9–10.3)
Chloride: 107 mmol/L (ref 98–111)
Creatinine, Ser: 3.18 mg/dL — ABNORMAL HIGH (ref 0.61–1.24)
GFR, Estimated: 21 mL/min — ABNORMAL LOW (ref >60)
Glucose, Bld: 134 mg/dL — ABNORMAL HIGH (ref 70–99)
Potassium: 3.6 mmol/L (ref 3.5–5.1)
Sodium: 143 mmol/L (ref 135–145)

## 2022-01-24 LAB — CBC WITH DIFFERENTIAL/PLATELET
Abs Immature Granulocytes: 0.04 10*3/uL (ref 0.00–0.07)
Basophils Absolute: 0 10*3/uL (ref 0.0–0.1)
Basophils Relative: 0 %
Eosinophils Absolute: 0 10*3/uL (ref 0.0–0.5)
Eosinophils Relative: 0 %
HCT: 39.5 % (ref 39.0–52.0)
Hemoglobin: 13.4 g/dL (ref 13.0–17.0)
Immature Granulocytes: 0 %
Lymphocytes Relative: 18 %
Lymphs Abs: 1.7 10*3/uL (ref 0.7–4.0)
MCH: 29.8 pg (ref 26.0–34.0)
MCHC: 33.9 g/dL (ref 30.0–36.0)
MCV: 88 fL (ref 80.0–100.0)
Monocytes Absolute: 0.5 10*3/uL (ref 0.1–1.0)
Monocytes Relative: 6 %
Neutro Abs: 7 10*3/uL (ref 1.7–7.7)
Neutrophils Relative %: 76 %
Platelets: 95 10*3/uL — ABNORMAL LOW (ref 150–400)
RBC: 4.49 MIL/uL (ref 4.22–5.81)
RDW: 14.8 % (ref 11.5–15.5)
WBC: 9.3 10*3/uL (ref 4.0–10.5)
nRBC: 0 % (ref 0.0–0.2)

## 2022-01-24 LAB — URINALYSIS, ROUTINE W REFLEX MICROSCOPIC
Bilirubin Urine: NEGATIVE
Glucose, UA: NEGATIVE mg/dL
Ketones, ur: NEGATIVE mg/dL
Nitrite: NEGATIVE
Protein, ur: 30 mg/dL — AB
RBC / HPF: >50 RBC/hpf — ABNORMAL HIGH (ref 0–5)
Specific Gravity, Urine: 1.013 (ref 1.005–1.030)
WBC, UA: >50 WBC/hpf — ABNORMAL HIGH (ref 0–5)
pH: 5 (ref 5.0–8.0)

## 2022-01-24 LAB — RESP PANEL BY RT-PCR (RSV, FLU A&B, COVID)  RVPGX2
Influenza A by PCR: NEGATIVE
Influenza B by PCR: NEGATIVE
Resp Syncytial Virus by PCR: NEGATIVE
SARS Coronavirus 2 by RT PCR: NEGATIVE

## 2022-01-24 LAB — CREATININE, SERUM
Creatinine, Ser: 3.37 mg/dL — ABNORMAL HIGH (ref 0.61–1.24)
GFR, Estimated: 20 mL/min — ABNORMAL LOW (ref >60)

## 2022-01-24 LAB — LACTIC ACID, PLASMA: Lactic Acid, Venous: 1.4 mmol/L (ref 0.5–1.9)

## 2022-01-24 LAB — GLUCOSE, CAPILLARY: Glucose-Capillary: 91 mg/dL (ref 70–99)

## 2022-01-24 MED ORDER — ORAL CARE MOUTH RINSE: 15.0000 mL | OROMUCOSAL | Status: DC | PRN

## 2022-01-24 MED ORDER — SODIUM CHLORIDE 0.9 % IV SOLN: INTRAVENOUS | Status: DC

## 2022-01-24 MED ORDER — ONDANSETRON HCL 4 MG PO TABS: 4.0000 mg | ORAL_TABLET | Freq: Four times a day (QID) | ORAL | Status: DC | PRN

## 2022-01-24 MED ORDER — SODIUM CHLORIDE 0.9 % IV SOLN
1.0000 g | INTRAVENOUS | Status: DC
  Administered 2022-01-24 – 2022-01-26 (×3): 1 g via INTRAVENOUS
  Filled 2022-01-24 (×4): qty 10

## 2022-01-24 MED ORDER — CEPHALEXIN 500 MG PO CAPS: 500.0000 mg | ORAL_CAPSULE | Freq: Three times a day (TID) | ORAL | Status: DC

## 2022-01-24 MED ORDER — SODIUM CHLORIDE 0.9 % IV BOLUS
1000.0000 mL | Freq: Once | INTRAVENOUS | Status: AC
  Administered 2022-01-24: 1000 mL via INTRAVENOUS

## 2022-01-24 MED ORDER — ONDANSETRON HCL 4 MG/2ML IJ SOLN: 4.0000 mg | Freq: Four times a day (QID) | INTRAMUSCULAR | Status: DC | PRN

## 2022-01-24 MED ORDER — CEPHALEXIN 500 MG PO CAPS
500.0000 mg | ORAL_CAPSULE | Freq: Four times a day (QID) | ORAL | Status: DC
  Administered 2022-01-24: 500 mg via ORAL
  Filled 2022-01-24: qty 1

## 2022-01-24 NOTE — ED Provider Notes (Signed)
Emergency Medicine Observation Re-evaluation Note  Tomer Chalmers is a 61 y.o. male, seen on rounds today.  Pt initially presented to the ED for complaints of Psychiatric Evaluation and Anxiety Currently, the patient is resting quietly.  Physical Exam  BP 124/74 (BP Location: Left Arm)   Pulse 70   Temp 99.3 F (37.4 C) (Oral)   Resp 18   Ht '5\' 10"'$  (1.778 m)   Wt 89.8 kg   SpO2 99%   BMI 28.41 kg/m  Physical Exam General: No acute distress Cardiac: Well-perfused Lungs: Nonlabored Psych: Cooperative  ED Course / MDM  EKG:EKG Interpretation  Date/Time:  Wednesday October 24 2021 05:46:01 EDT Ventricular Rate:  74 PR Interval:  136 QRS Duration: 144 QT Interval:  408 QTC Calculation: 453 R Axis:   -75 Text Interpretation: Sinus rhythm RBBB and LAFB When compared with ECG of 05/14/2019, Right bundle branch block is now present Confirmed by Delora Fuel ([id number]) on 10/25/2021 12:29:06 AM  I have reviewed the labs performed to date as well as medications administered while in observation.  Recent changes in the last 24 hours include fever.  Also had some hematuria.  Labs ordered and showed normal white count although platelets low at 93 which is fairly new for patient.  Urinalysis also concerning for infection will start on antibiotics..  Plan  Current plan is for awaiting psychiatric placement.  Antibiotics to be initiated for possible UTI..  1130am Labs also done today and his creatinine is markedly elevated, platlets down.  IV fluids ordered and ibuprofen stopped.  Discussed with patient's primary nurse and he will need IV fluids.  Will call hospitalist regarding medical admission. Ordered blood cultures, lactate and repeat covid/flu testing.  Discussed with Dr. Olevia Bowens, [facility].  He will see the patient in consult.  Unclear at this time if this will result in patient being medically admitted or will continue to be managed in the emergency department.    Hayden Rasmussen, MD 01/24/22 2035

## 2022-01-24 NOTE — Progress Notes (Signed)
Clozapine REMS Program Update:   Patient & MD registered; Labs entered and acceptable.   ANC 7 K/uL   Per Clozapine [facility] program, acceptable to continue medication. [place] monitoring required weekly while inpatient.   Lindell Spar, PharmD, BCPS Clinical Pharmacist 01/24/2022 10:25 AM

## 2022-01-24 NOTE — Plan of Care (Signed)
  Problem: Activity: Goal: Risk for activity intolerance will decrease Outcome: Progressing   Problem: Coping: Goal: Level of anxiety will decrease Outcome: Progressing   Problem: Nutritional: Goal: Maintenance of adequate nutrition will improve Outcome: Progressing

## 2022-01-24 NOTE — H&P (Signed)
History and Physical    Patient: Peter Parsons [id number] DOB: March 06, 1960 DOA: 10/23/2021 DOS: the patient was seen and examined on 01/24/2022 PCP: System, Provider Not In  Patient coming from: Home  Chief Complaint:  Chief Complaint  Patient presents with   Psychiatric Evaluation   Anxiety   HPI: Peter Parsons is a 61 y.o. male with medical history significant of type 2 diabetes, hypertension, prostate cancer, schizophrenia who has been in the emergency department waiting for placement for the past 3 months and we have been asked to evaluate due to acute kidney injury.  The patient stated that he has had some dysuria and his urine has been looking very concentrated recently, which we were able to corroborate as he had an urine sample by bedside. He denied fever, chills, rhinorrhea, sore throat, wheezing or hemoptysis.  No chest pain, palpitations, diaphoresis, PND, orthopnea or pitting edema of the lower extremities.  No abdominal pain, nausea, emesis, diarrhea, constipation, melena or hematochezia.  No flank pain, frequency or hematuria.  No polyuria, polydipsia, polyphagia or blurred vision.   ED course: Initial vital signs were temperature He had a fever yesterday morning of 101.3 F, but most recent temperature is 98.8 F, pulse 65, respiration 18 blood pressure 113/77.  He was given 1 g of ceftriaxone IVPB and 1 L of normal saline bolus.  Lab work: Urinalysis showed large hemoglobinuria, large leukocyte esterase, many bacteria, positive WBC clumps, more than 50 RBC and more than 50 WBC on microscopic examination.  Lactic acid was normal.  BMP showed normal electrolytes except for calcium of 8.6 mg deciliter.  Glucose 134, BUN 59 and creatinine 3.18 mg/dL.  His most recent CBCs are white count 9.3, him on 13.4 g/dL platelets 95.  Coronavirus and influenza PCR negative.   Review of Systems: As mentioned in the history of present illness. All other systems reviewed and are negative.  Past  Medical History:  Diagnosis Date   Diabetes mellitus without complication ([place])    Hypertension    Prostate cancer ([place])    Schizophrenia ([place])    Past Surgical History:  Procedure Laterality Date   INSERTION PROSTATE RADIATION SEED     Social History:  reports that he has quit smoking. He has never used smokeless tobacco. He reports that he does not currently use alcohol. He reports that he does not currently use drugs.  Allergies  Allergen Reactions   Acetaminophen Other (See Comments)    Unknown Unable to breathe Chest  tightness/SOB     History reviewed. No pertinent family history.  Prior to Admission medications   Medication Sig Start Date End Date Taking? Authorizing Provider  ARIPIPRAZOLE IM Inject into the muscle every 30 (thirty) days.   Yes [provider]  ARIPiprazole ER (ABILIFY MAINTENA) 300 MG PRSY prefilled syringe Inject 400 mg into the muscle every 28 (twenty-eight) days. 01/21/22   Lacretia Leigh, MD  atenolol (TENORMIN) 25 MG tablet Take 1 tablet (25 mg total) by mouth at bedtime. 01/21/22   Lacretia Leigh, MD  cloZAPine (CLOZARIL) 100 MG tablet Take 3 tablets (300 mg total) by mouth at bedtime. 01/21/22   Lacretia Leigh, MD  gabapentin (NEURONTIN) 100 MG capsule Take 1 capsule (100 mg total) by mouth 2 (two) times daily. 01/21/22   Lacretia Leigh, MD  LORazepam (ATIVAN) 2 MG tablet Take 1 tablet (2 mg total) by mouth daily as needed for anxiety. 01/15/22   Dorie Rank, MD  LORazepam (ATIVAN) 2 MG tablet Take 1 tablet (  2 mg total) by mouth 2 (two) times daily. 01/21/22   Lacretia Leigh, MD  nicotine (NICODERM CQ - DOSED IN MG/24 HOURS) 14 mg/24hr patch Place 1 patch (14 mg total) onto the skin daily. 01/21/22   Lacretia Leigh, MD  pravastatin (PRAVACHOL) 40 MG tablet Take 1 tablet (40 mg total) by mouth daily. 01/21/22   Lacretia Leigh, MD  sertraline (ZOLOFT) 100 MG tablet Take 2 tablets (200 mg total) by mouth daily. 01/21/22   Lacretia Leigh, MD   tamsulosin (FLOMAX) 0.4 MG CAPS capsule Take 1 capsule (0.4 mg total) by mouth daily. 01/21/22   Lacretia Leigh, MD    Physical Exam: Vitals:   01/23/22 0750 01/23/22 1243 01/23/22 1854 01/24/22 0627  BP:  98/63 (!) 92/54 124/74  Pulse:  81 69 70  Resp:  '16 16 18  '$ Temp: 100.2 F (37.9 C) 98.2 F (36.8 C) 98.3 F (36.8 C) 99.3 F (37.4 C)  TempSrc: Oral Oral Oral Oral  SpO2:  93% 100% 99%  Weight:      Height:       Physical Exam Vitals and nursing note reviewed.  Constitutional:      General: He is awake. He is not in acute distress.    Appearance: Normal appearance.  HENT:     Head: Normocephalic.     Nose: No rhinorrhea.     Mouth/Throat:     Mouth: Mucous membranes are moist.  Eyes:     General: No scleral icterus.    Pupils: Pupils are equal, round, and reactive to light.  Neck:     Vascular: No JVD.  Cardiovascular:     Rate and Rhythm: Normal rate and regular rhythm.     Heart sounds: S1 normal and S2 normal.  Pulmonary:     Effort: Pulmonary effort is normal.     Breath sounds: Normal breath sounds. No wheezing, rhonchi or rales.  Abdominal:     General: Bowel sounds are normal. There is no distension.     Palpations: Abdomen is soft.     Tenderness: There is abdominal tenderness in the suprapubic area. There is no right CVA tenderness, left CVA tenderness, guarding or rebound.  Musculoskeletal:     Cervical back: Neck supple.     Right lower leg: No edema.     Left lower leg: No edema.  Skin:    General: Skin is warm and dry.  Neurological:     General: No focal deficit present.     Mental Status: He is alert and oriented to person, place, and time.  Psychiatric:        Mood and Affect: Mood normal.        Behavior: Behavior normal. Behavior is cooperative.   Data Reviewed:  Results are pending, will review when available.  Assessment and Plan: Principal Problem:   AKI (acute kidney injury) ([place]) MedSurg/inpatient. Continue IV fluids. Avoid  hypotension. Avoid nephrotoxins. Monitor intake and output. Monitor renal function electrolytes.  Active Problems:   Acute urinary tract infection Continue ceftriaxone 1 g IVPB daily. Follow-up blood and urine cultures.    Hypertension Continue atenolol 25 mg p.o. bedtime. Monitor blood pressure and heart rate.    Type 2 diabetes mellitus (HCC) Carbohydrate modified diet. CBG monitoring before meals and bedtime. Check hemoglobin A1c.    BPH (benign prostatic hyperplasia) Continue tamsulosin 0.4 mg daily.    Thrombocytopenia (HCC) Monitor platelet count.    Paranoid schizophrenia (HCC) Continue clozapine 300 mg p.o. daily. Continue lorazepam  2 mg p.o. twice daily. Continue lorazepam 2 mg p.o. daily as needed. Continue sertraline 200 mg p.o. daily. Still awaiting placement to psychiatric facility.     Advance Care Planning:   Code Status: Full code.  Consults:   Family Communication:   Severity of Illness: The appropriate patient status for this patient is INPATIENT. Inpatient status is judged to be reasonable and necessary in order to provide the required intensity of service to ensure the patient's safety. The patient's presenting symptoms, physical exam findings, and initial radiographic and laboratory data in the context of their chronic comorbidities is felt to place them at high risk for further clinical deterioration. Furthermore, it is not anticipated that the patient will be medically stable for discharge from the hospital within 2 midnights of admission.   * I certify that at the point of admission it is my clinical judgment that the patient will require inpatient hospital care spanning beyond 2 midnights from the point of admission due to high intensity of service, high risk for further deterioration and high frequency of surveillance required.*  Author: Reubin Milan, MD 01/24/2022 12:52 PM  For on call review [email or web address].   This document was  prepared using Dragon voice recognition software and may contain some unintended transcription errors.

## 2022-01-24 NOTE — Progress Notes (Signed)
CC at [facility] reported that "there is an internal consult is being scheduled around the pt's authorization and funding concerns." Larene Beach reports she will let us know updates as they come.

## 2022-01-24 NOTE — ED Notes (Signed)
Patient and techs reported he has been running fevers. Patient told me this morning his urine has blood in it and is rust color. MD made aware and UA added.

## 2022-01-25 DIAGNOSIS — N179 Acute kidney failure, unspecified: Secondary | ICD-10-CM

## 2022-01-25 DIAGNOSIS — E870 Hyperosmolality and hypernatremia: Secondary | ICD-10-CM

## 2022-01-25 DIAGNOSIS — R338 Other retention of urine: Secondary | ICD-10-CM

## 2022-01-25 DIAGNOSIS — D696 Thrombocytopenia, unspecified: Secondary | ICD-10-CM

## 2022-01-25 DIAGNOSIS — F200 Paranoid schizophrenia: Secondary | ICD-10-CM | POA: Diagnosis not present

## 2022-01-25 DIAGNOSIS — N401 Enlarged prostate with lower urinary tract symptoms: Secondary | ICD-10-CM

## 2022-01-25 DIAGNOSIS — I10 Essential (primary) hypertension: Secondary | ICD-10-CM

## 2022-01-25 LAB — GLUCOSE, CAPILLARY
Glucose-Capillary: 91 mg/dL (ref 70–99)
Glucose-Capillary: 123 mg/dL — ABNORMAL HIGH (ref 70–99)
Glucose-Capillary: 142 mg/dL — ABNORMAL HIGH (ref 70–99)
Glucose-Capillary: 120 mg/dL — ABNORMAL HIGH (ref 70–99)

## 2022-01-25 LAB — COMPREHENSIVE METABOLIC PANEL
ALT: 13 U/L (ref 0–44)
AST: 16 U/L (ref 15–41)
Albumin: 3.4 g/dL — ABNORMAL LOW (ref 3.5–5.0)
Alkaline Phosphatase: 67 U/L (ref 38–126)
Anion gap: 10 (ref 5–15)
BUN: 50 mg/dL — ABNORMAL HIGH (ref 8–23)
CO2: 23 mmol/L (ref 22–32)
Calcium: 8.4 mg/dL — ABNORMAL LOW (ref 8.9–10.3)
Chloride: 113 mmol/L — ABNORMAL HIGH (ref 98–111)
Creatinine, Ser: 2.11 mg/dL — ABNORMAL HIGH (ref 0.61–1.24)
GFR, Estimated: 35 mL/min — ABNORMAL LOW (ref >60)
Glucose, Bld: 95 mg/dL (ref 70–99)
Potassium: 4.4 mmol/L (ref 3.5–5.1)
Sodium: 146 mmol/L — ABNORMAL HIGH (ref 135–145)
Total Bilirubin: 0.5 mg/dL (ref 0.3–1.2)
Total Protein: 6.6 g/dL (ref 6.5–8.1)

## 2022-01-25 LAB — CBC
HCT: 36.6 % — ABNORMAL LOW (ref 39.0–52.0)
Hemoglobin: 12.2 g/dL — ABNORMAL LOW (ref 13.0–17.0)
MCH: 29.5 pg (ref 26.0–34.0)
MCHC: 33.3 g/dL (ref 30.0–36.0)
MCV: 88.4 fL (ref 80.0–100.0)
Platelets: 107 10*3/uL — ABNORMAL LOW (ref 150–400)
RBC: 4.14 MIL/uL — ABNORMAL LOW (ref 4.22–5.81)
RDW: 14.6 % (ref 11.5–15.5)
WBC: 6.4 10*3/uL (ref 4.0–10.5)
nRBC: 0 % (ref 0.0–0.2)

## 2022-01-25 LAB — HIV ANTIBODY (ROUTINE TESTING W REFLEX): HIV Screen 4th Generation wRfx: NONREACTIVE

## 2022-01-25 MED ORDER — SODIUM CHLORIDE 0.45 % IV SOLN: INTRAVENOUS | Status: DC

## 2022-01-25 NOTE — Progress Notes (Signed)
PROGRESS NOTE  Peter Parsons ZOX:[id number] DOB: 01/21/1961   PCP: System, Provider Not In  Patient is from: Home  DOA: 10/23/2021 LOS: 1  Chief complaints Chief Complaint  Patient presents with   Psychiatric Evaluation   Anxiety     Brief Narrative / Interim history: 61 year old M with PMH of schizophrenia, CKD-3A,  HTN, BPH and prostate cancer who was seen in ED waiting placement for the past 3 months and developed AKI and UTI for which hospitalist service was consulted for admission.  Patient had a fever to 101.3 on 12/12.  He had some dysuria and concentrated and dusky urine.  Hemodynamically stable.  UA concerning for UTI. Cr 3.37 (1.6 in 10/2021).  Urine culture and blood culture obtained.  Patient was started on IV fluid and IV ceftriaxone and admitted.   Subjective: Seen and examined earlier this morning.  No major events overnight of this morning.  He says he is afraid of dying.  He denies dysuria today.  He says his urine is turbid and concentrated.  Denies chest pain, dyspnea or GI symptoms.  Objective: Vitals:   01/24/22 1616 01/24/22 1625 01/24/22 2013 01/25/22 0854  BP: 135/74  (!) 147/87 130/72  Pulse: 65  68 72  Resp: '20  18 18  '$ Temp: 98.7 F (37.1 C)  98.8 F (37.1 C) 98.8 F (37.1 C)  TempSrc: Oral  Oral Oral  SpO2: 100%  99% 98%  Weight:  91.2 kg    Height:  '5\' 10"'$  (1.778 m)      Examination:  GENERAL: No apparent distress.  Nontoxic. HEENT: MMM.  Poor dentition.  Vision and hearing grossly intact.  NECK: Supple.  No apparent JVD.  RESP:  No IWOB.  Fair aeration bilaterally. CVS:  RRR. Heart sounds normal.  ABD/GI/GU: BS+. Abd soft, NTND.  MSK/EXT:  Moves extremities. No apparent deformity. No edema.  SKIN: no apparent skin lesion or wound NEURO: Awake, alert and oriented appropriately.  No apparent focal neuro deficit. PSYCH: Calm. Normal affect.   Procedures:  None  Microbiology summarized: Blood cultures NGTD Urine culture with  GNR.  Assessment and plan: Principal Problem:   Paranoid schizophrenia ([place]) Active Problems:   Hypertension   BPH (benign prostatic hyperplasia)   Thrombocytopenia (HCC)   Acute urinary retention   AKI (acute kidney injury) ([place])   Hypernatremia  AKI on CKD-3A/azotemia: Likely prerenal.  Improving. Recent Labs    10/23/21 1840 10/24/21 0009 10/26/21 1329 01/24/22 1034 01/24/22 1210 01/25/22 0402  BUN 28* 27* 31*  --  59* 50*  CREATININE 1.63* 1.80* 1.58* 3.37* 3.18* 2.11*  -Change IV NS to half NS due to hypernatremia. -Avoid nephrotoxic meds -Treat UTI  Acute urinary tract infection with hematuria: Had fever, dysuria and turbid urine.  UA concerning.  Urine culture with GNR.  Blood cultures NGTD.  No CVA tenderness. -Continue IV ceftriaxone pending culture speciation and sensitivity   Essential hypertension: Normotensive. -Continue atenolol 25 mg p.o. bedtime.   Mild hyperglycemia: Not diabetic. -Check hemoglobin A1c  BPH with LUTS in the setting of UTI -Continue tamsulosin 0.4 mg daily. -Antibiotics as above.   Thrombocytopenia (HCC) -Monitor platelet count.  Mild hyponatremia: Na 146. -Change IV NS to half NS   Paranoid schizophrenia (HCC) -Continue clozapine 300 mg p.o. daily, lorazepam 2 mg twice daily and as needed, Zoloft 200 mg daily. -Continue gabapentin at night. -Still awaiting placement to psychiatric facility. -Continue one-to-one safety sitter  Body mass index is 28.84 kg/m.    DVT  prophylaxis:  SCDs Start: 01/24/22 1252  Code Status: Full code Family Communication: None at the side Level of care: Med-Surg Status is: Inpatient Remains inpatient appropriate because: Due to AKI and urinary tract infection   Final disposition: Inpatient psychiatric hospital once medically stable. Consultants:  Psychiatry  Sch Meds:  Scheduled Meds:  atenolol  25 mg Oral QHS   cloZAPine  300 mg Oral QHS   gabapentin  100 mg Oral BID   LORazepam   2 mg Oral BID   nicotine  14 mg Transdermal Daily   sertraline  200 mg Oral Daily   tamsulosin  0.4 mg Oral Daily   Continuous Infusions:  sodium chloride     cefTRIAXone (ROCEPHIN)  IV 1 g (01/25/22 1339)   PRN Meds:.LORazepam, ondansetron **OR** ondansetron (ZOFRAN) IV, mouth rinse  Antimicrobials: Anti-infectives (From admission, onward)    Start     Dose/Rate Route Frequency Ordered Stop   01/24/22 1800  cephALEXin (KEFLEX) capsule 500 mg  Status:  Discontinued        500 mg Oral Every 8 hours 01/24/22 1132 01/24/22 1226   01/24/22 1300  cefTRIAXone (ROCEPHIN) 1 g in sodium chloride 0.9 % 100 mL IVPB        1 g 200 mL/hr over 30 Minutes Intravenous Every 24 hours 01/24/22 1226     01/24/22 0845  cephALEXin (KEFLEX) capsule 500 mg  Status:  Discontinued        500 mg Oral Every 6 hours 01/24/22 0844 01/24/22 1132   11/28/21 2315  levofloxacin (LEVAQUIN) tablet 500 mg        500 mg Oral Daily 11/28/21 2300 12/07/21 0936        I have personally reviewed the following labs and images: CBC: Recent Labs  Lab 01/24/22 0646 01/25/22 0402  WBC 9.3 6.4  NEUTROABS 7.0  --   HGB 13.4 12.2*  HCT 39.5 36.6*  MCV 88.0 88.4  PLT 95* 107*   BMP &GFR Recent Labs  Lab 01/24/22 1034 01/24/22 1210 01/25/22 0402  NA  --  143 146*  K  --  3.6 4.4  CL  --  107 113*  CO2  --  26 23  GLUCOSE  --  134* 95  BUN  --  59* 50*  CREATININE 3.37* 3.18* 2.11*  CALCIUM  --  8.6* 8.4*   Estimated Creatinine Clearance: 40.7 mL/min (A) (by C-G formula based on SCr of 2.11 mg/dL (H)). Liver & Pancreas: Recent Labs  Lab 01/25/22 0402  AST 16  ALT 13  ALKPHOS 67  BILITOT 0.5  PROT 6.6  ALBUMIN 3.4*   No results for input(s): "LIPASE", "AMYLASE" in the last 168 hours. No results for input(s): "AMMONIA" in the last 168 hours. Diabetic: No results for input(s): "HGBA1C" in the last 72 hours. Recent Labs  Lab 01/24/22 2049 01/25/22 0717 01/25/22 1155  GLUCAP 91 91 123*    Cardiac Enzymes: No results for input(s): "CKTOTAL", "CKMB", "CKMBINDEX", "TROPONINI" in the last 168 hours. No results for input(s): "PROBNP" in the last 8760 hours. Coagulation Profile: No results for input(s): "INR", "PROTIME" in the last 168 hours. Thyroid Function Tests: No results for input(s): "TSH", "T4TOTAL", "FREET4", "T3FREE", "THYROIDAB" in the last 72 hours. Lipid Profile: No results for input(s): "CHOL", "HDL", "LDLCALC", "TRIG", "CHOLHDL", "LDLDIRECT" in the last 72 hours. Anemia Panel: No results for input(s): "VITAMINB12", "FOLATE", "FERRITIN", "TIBC", "IRON", "RETICCTPCT" in the last 72 hours. Urine analysis:    Component Value Date/Time  COLORURINE RED (A) 01/24/2022 0646   APPEARANCEUR CLOUDY (A) 01/24/2022 0646   LABSPEC 1.013 01/24/2022 0646   PHURINE 5.0 01/24/2022 0646   GLUCOSEU NEGATIVE 01/24/2022 0646   HGBUR LARGE (A) 01/24/2022 0646   BILIRUBINUR NEGATIVE 01/24/2022 0646   KETONESUR NEGATIVE 01/24/2022 0646   PROTEINUR 30 (A) 01/24/2022 0646   NITRITE NEGATIVE 01/24/2022 0646   LEUKOCYTESUR LARGE (A) 01/24/2022 0646   Sepsis Labs: Invalid input(s): "PROCALCITONIN", "LACTICIDVEN"  Microbiology: Recent Results (from the past 240 hour(s))  SARS Coronavirus 2 by RT PCR (hospital order, performed in [facility] hospital lab) *cepheid single result test* Anterior Nasal Swab     Status: None   Collection Time: 01/23/22  8:24 AM   Specimen: Anterior Nasal Swab  Result Value Ref Range Status   SARS Coronavirus 2 by RT PCR NEGATIVE NEGATIVE Final    Comment: (NOTE) SARS-CoV-2 target nucleic acids are NOT DETECTED.  The SARS-CoV-2 RNA is generally detectable in upper and lower respiratory specimens during the acute phase of infection. The lowest concentration of SARS-CoV-2 viral copies this assay can detect is 250 copies / mL. A negative result does not preclude SARS-CoV-2 infection and should not be used as the sole basis for treatment or  other patient management decisions.  A negative result may occur with improper specimen collection / handling, submission of specimen other than nasopharyngeal swab, presence of viral mutation(s) within the areas targeted by this assay, and inadequate number of viral copies (<250 copies / mL). A negative result must be combined with clinical observations, patient history, and epidemiological information.  Fact Sheet for Patients:   https://www.patel.info/  Fact Sheet for Healthcare Providers: https://hall.com/  This test is not yet approved or  cleared by the [place] FDA and has been authorized for detection and/or diagnosis of SARS-CoV-2 by FDA under an Emergency Use Authorization (EUA).  This EUA will remain in effect (meaning this test can be used) for the duration of the COVID-19 declaration under Section 564(b)(1) of the Act, 21 U.S.C. section 360bbb-3(b)(1), unless the authorization is terminated or revoked sooner.  Performed at [facility], [place] [address], [place], [place] [address]   Urine Culture     Status: Abnormal (Preliminary result)   Collection Time: 01/24/22  6:46 AM   Specimen: Urine, Clean Catch  Result Value Ref Range Status   Specimen Description   Final    URINE, CLEAN CATCH Performed at [facility], [place] [address], [place], [place] [address]    Special Requests   Final    NONE Performed at [facility], [place] [address], [place], [place] [address]    Culture (A)  Final    >=100,000 COLONIES/mL GRAM NEGATIVE RODS SUSCEPTIBILITIES TO FOLLOW Performed at [facility], [place] [address] [place],  [address]    Report Status PENDING  Incomplete  Resp panel by RT-PCR (RSV, Flu A&B, Covid) Anterior Nasal Swab     Status: None   Collection Time: 01/24/22 11:43 AM   Specimen: Anterior Nasal Swab  Result Value Ref Range Status   SARS Coronavirus  2 by RT PCR NEGATIVE NEGATIVE Final    Comment: (NOTE) SARS-CoV-2 target nucleic acids are NOT DETECTED.  The SARS-CoV-2 RNA is generally detectable in upper respiratory specimens during the acute phase of infection. The lowest concentration of SARS-CoV-2 viral copies this assay can detect is 138 copies/mL. A negative result does not preclude SARS-Cov-2 infection and should not be used as the  sole basis for treatment or other patient management decisions. A negative result may occur with  improper specimen collection/handling, submission of specimen other than nasopharyngeal swab, presence of viral mutation(s) within the areas targeted by this assay, and inadequate number of viral copies(<138 copies/mL). A negative result must be combined with clinical observations, patient history, and epidemiological information. The expected result is Negative.  Fact Sheet for Patients:  EntrepreneurPulse.com.au  Fact Sheet for Healthcare Providers:  IncredibleEmployment.be  This test is no t yet approved or cleared by the [place] FDA and  has been authorized for detection and/or diagnosis of SARS-CoV-2 by FDA under an Emergency Use Authorization (EUA). This EUA will remain  in effect (meaning this test can be used) for the duration of the COVID-19 declaration under Section 564(b)(1) of the Act, 21 U.S.C.section 360bbb-3(b)(1), unless the authorization is terminated  or revoked sooner.       Influenza A by PCR NEGATIVE NEGATIVE Final   Influenza B by PCR NEGATIVE NEGATIVE Final    Comment: (NOTE) The Xpert Xpress SARS-CoV-2/FLU/RSV plus assay is intended as an aid in the diagnosis of influenza from Nasopharyngeal swab specimens and should not be used as a sole basis for treatment. Nasal washings and aspirates are unacceptable for Xpert Xpress SARS-CoV-2/FLU/RSV testing.  Fact Sheet for Patients: EntrepreneurPulse.com.au  Fact  Sheet for Healthcare Providers: IncredibleEmployment.be  This test is not yet approved or cleared by the [place] FDA and has been authorized for detection and/or diagnosis of SARS-CoV-2 by FDA under an Emergency Use Authorization (EUA). This EUA will remain in effect (meaning this test can be used) for the duration of the COVID-19 declaration under Section 564(b)(1) of the Act, 21 U.S.C. section 360bbb-3(b)(1), unless the authorization is terminated or revoked.     Resp Syncytial Virus by PCR NEGATIVE NEGATIVE Final    Comment: (NOTE) Fact Sheet for Patients: EntrepreneurPulse.com.au  Fact Sheet for Healthcare Providers: IncredibleEmployment.be  This test is not yet approved or cleared by the [place] FDA and has been authorized for detection and/or diagnosis of SARS-CoV-2 by FDA under an Emergency Use Authorization (EUA). This EUA will remain in effect (meaning this test can be used) for the duration of the COVID-19 declaration under Section 564(b)(1) of the Act, 21 U.S.C. section 360bbb-3(b)(1), unless the authorization is terminated or revoked.  Performed at [facility], [place] [address] [place], [place] [address]   Culture, blood (routine x 2)     Status: None (Preliminary result)   Collection Time: 01/24/22 12:10 PM   Specimen: BLOOD RIGHT FOREARM  Result Value Ref Range Status   Specimen Description   Final    BLOOD RIGHT FOREARM Performed at [place] [address], [place], [place] [address]    Special Requests   Final    BOTTLES DRAWN AEROBIC AND ANAEROBIC Blood Culture results may not be optimal due to an excessive volume of blood received in culture bottles Performed at [place] [address] [place], [place] [address]    Culture   Final    NO GROWTH < 24 HOURS Performed at [place] [address] [place], [place]  [address]    Report Status PENDING  Incomplete  Culture, blood (routine x 2)     Status: None (Preliminary result)   Collection Time: 01/24/22 12:10 PM   Specimen: BLOOD LEFT FOREARM  Result Value Ref Range Status   Specimen Description   Final    BLOOD  LEFT FOREARM Performed at [place] [address], [place], [place] [address]    Special Requests   Final    BOTTLES DRAWN AEROBIC AND ANAEROBIC Blood Culture results may not be optimal due to an excessive volume of blood received in culture bottles Performed at [place] [address] [place], [place] [address]    Culture   Final    NO GROWTH < 24 HOURS Performed at [place] [address], [place], [place] [address]    Report Status PENDING  Incomplete    Radiology Studies: No results found.    Jisela Merlino T. [place]  If 7PM-7AM, please contact night-coverage [email or web address] 01/25/2022, 2:53 PM

## 2022-01-25 NOTE — Plan of Care (Signed)
  Problem: Safety: Goal: Ability to remain free from injury will improve Outcome: Progressing   

## 2022-01-25 NOTE — Progress Notes (Signed)
Patient laying in bed, calm and cooperative but repeatedly stating "I don't how I'm supposed to feel." "I don't know how to feel, my brain feels like it's trying to catch up but it can't." Provided reassurance and ativan. Sitter remains at bedside.

## 2022-01-26 DIAGNOSIS — R45851 Suicidal ideations: Secondary | ICD-10-CM

## 2022-01-26 DIAGNOSIS — R440 Auditory hallucinations: Secondary | ICD-10-CM

## 2022-01-26 DIAGNOSIS — F200 Paranoid schizophrenia: Secondary | ICD-10-CM | POA: Diagnosis not present

## 2022-01-26 LAB — URINE CULTURE: Culture: >=100000 — AB

## 2022-01-26 LAB — RENAL FUNCTION PANEL
Albumin: 3.2 g/dL — ABNORMAL LOW (ref 3.5–5.0)
Anion gap: 8 (ref 5–15)
BUN: 43 mg/dL — ABNORMAL HIGH (ref 8–23)
CO2: 26 mmol/L (ref 22–32)
Calcium: 8.5 mg/dL — ABNORMAL LOW (ref 8.9–10.3)
Chloride: 112 mmol/L — ABNORMAL HIGH (ref 98–111)
Creatinine, Ser: 1.9 mg/dL — ABNORMAL HIGH (ref 0.61–1.24)
GFR, Estimated: 39 mL/min — ABNORMAL LOW (ref >60)
Glucose, Bld: 94 mg/dL (ref 70–99)
Phosphorus: 3.6 mg/dL (ref 2.5–4.6)
Potassium: 4.2 mmol/L (ref 3.5–5.1)
Sodium: 146 mmol/L — ABNORMAL HIGH (ref 135–145)

## 2022-01-26 LAB — CBC
HCT: 33.5 % — ABNORMAL LOW (ref 39.0–52.0)
Hemoglobin: 11.2 g/dL — ABNORMAL LOW (ref 13.0–17.0)
MCH: 29.6 pg (ref 26.0–34.0)
MCHC: 33.4 g/dL (ref 30.0–36.0)
MCV: 88.6 fL (ref 80.0–100.0)
Platelets: 100 10*3/uL — ABNORMAL LOW (ref 150–400)
RBC: 3.78 MIL/uL — ABNORMAL LOW (ref 4.22–5.81)
RDW: 14.6 % (ref 11.5–15.5)
WBC: 4 10*3/uL (ref 4.0–10.5)
nRBC: 0 % (ref 0.0–0.2)

## 2022-01-26 LAB — GLUCOSE, CAPILLARY
Glucose-Capillary: 89 mg/dL (ref 70–99)
Glucose-Capillary: 144 mg/dL — ABNORMAL HIGH (ref 70–99)
Glucose-Capillary: 86 mg/dL (ref 70–99)
Glucose-Capillary: 60 mg/dL — ABNORMAL LOW (ref 70–99)
Glucose-Capillary: 105 mg/dL — ABNORMAL HIGH (ref 70–99)

## 2022-01-26 LAB — MAGNESIUM: Magnesium: 2 mg/dL (ref 1.7–2.4)

## 2022-01-26 LAB — HEMOGLOBIN A1C
Hgb A1c MFr Bld: 4.7 % — ABNORMAL LOW (ref 4.8–5.6)
Mean Plasma Glucose: 88 mg/dL

## 2022-01-26 MED ORDER — SODIUM CHLORIDE 0.9 % IV SOLN
1.0000 g | INTRAVENOUS | Status: DC
  Administered 2022-01-26 – 2022-01-27 (×2): 1 g via INTRAVENOUS
  Filled 2022-01-26 (×3): qty 10

## 2022-01-26 NOTE — Progress Notes (Signed)
  Progress Note   Patient: Peter Parsons [id number]:[id number] DOB: January 15, 1961 DOA: 10/23/2021     2 DOS: the patient was seen and examined on 01/26/2022   Brief hospital course: 61 year old M with PMH of schizophrenia, CKD-3A,  HTN, BPH and prostate cancer who was seen in ED waiting placement for the past 3 months and developed AKI and UTI for which hospitalist service was consulted for admission.  Patient had a fever to 101.3 on 12/12.  He had some dysuria and concentrated and dusky urine.  Hemodynamically stable.  UA concerning for UTI. Cr 3.37 (1.6 in 10/2021).  Urine culture and blood culture obtained.  Patient was started on IV fluid and IV ceftriaxone and admitted.   Assessment and Plan: AKI on CKD-3A/azotemia: Likely prerenal.  Improving. -Change IV NS to half NS due to hypernatremia. -Avoid nephrotoxic meds -Treat UTI, cont abx   Acute urinary tract infection with hematuria: Had fever, dysuria and turbid urine.  UA concerning.  Urine culture with GNR.  Blood cultures NGTD.  No CVA tenderness. -was on empiric IV ceftriaxone. Urine cx reviewed, now pos for enterobacter, now on cefepime   Essential hypertension: Normotensive. -Continue atenolol 25 mg p.o. bedtime.   Mild hyperglycemia: Not diabetic. -Check hemoglobin A1c   BPH with LUTS in the setting of UTI -Continue tamsulosin 0.4 mg daily. -Antibiotics as above.   Thrombocytopenia (HCC) -Monitor platelet count.   Mild hyponatremia: Na 146. -Change IV NS to half NS   Paranoid schizophrenia (HCC) -Continue clozapine 300 mg p.o. daily, lorazepam 2 mg twice daily and as needed, Zoloft 200 mg daily. -Continue gabapentin at night. -Still awaiting placement to psychiatric facility. -Continue one-to-one safety sitter   Body mass index is 28.84 kg/m.      Subjective: Eager to go be discharged to "my new home" soon  Physical Exam: Vitals:   01/25/22 1454 01/25/22 2122 01/26/22 0500 01/26/22 1355  BP: 124/68 (!) 160/88 130/83  135/78  Pulse: 71 62 64 72  Resp: '18 17 17 18  '$ Temp: 98.7 F (37.1 C) 99.7 F (37.6 C) 98.5 F (36.9 C) 98.5 F (36.9 C)  TempSrc: Oral Oral Oral   SpO2: 97% 100% 97% 99%  Weight:      Height:       General exam: Awake, laying in bed, in nad Respiratory system: Normal respiratory effort, no wheezing Cardiovascular system: regular rate, s1, s2 Gastrointestinal system: Soft, nondistended, positive BS Central nervous system: CN2-12 grossly intact, strength intact Extremities: Perfused, no clubbing Skin: Normal skin turgor, no notable skin lesions seen Psychiatry: Mood normal // no visual hallucinations   Data Reviewed:  Labs reviewed: Na, 146, K 4.2, Cr 1.90, WBC 4.0  Family Communication: Pt in room, family not at bedside   Disposition: Status is: Inpatient Remains inpatient appropriate because: Severity of illness  Planned Discharge Destination:  Inpt psych     Author: Marylu Lund, MD 01/26/2022 5:50 PM  For on call review [email or web address].

## 2022-01-26 NOTE — Progress Notes (Signed)
Paged by staff to provide presence and support for this patient. Met with him and allowed space fro him to freely share his concerns. Shared in conversation with him and prayer. Provided a bible and other reading materials. Will put in request for follow [place] for support.

## 2022-01-26 NOTE — Hospital Course (Signed)
61 year old M with PMH of schizophrenia, CKD-3A,  HTN, BPH and prostate cancer who was seen in ED waiting placement for the past 3 months and developed AKI and UTI for which hospitalist service was consulted for admission.  Patient had a fever to 101.3 on 12/12.  He had some dysuria and concentrated and dusky urine.  Hemodynamically stable.  UA concerning for UTI. Cr 3.37 (1.6 in 10/2021).  Urine culture and blood culture obtained.  Patient was started on IV fluid and IV ceftriaxone and admitted.

## 2022-01-27 DIAGNOSIS — R440 Auditory hallucinations: Secondary | ICD-10-CM | POA: Diagnosis not present

## 2022-01-27 DIAGNOSIS — R45851 Suicidal ideations: Secondary | ICD-10-CM | POA: Diagnosis not present

## 2022-01-27 DIAGNOSIS — F200 Paranoid schizophrenia: Secondary | ICD-10-CM | POA: Diagnosis not present

## 2022-01-27 LAB — COMPREHENSIVE METABOLIC PANEL
ALT: 11 U/L (ref 0–44)
AST: 14 U/L — ABNORMAL LOW (ref 15–41)
Albumin: 3 g/dL — ABNORMAL LOW (ref 3.5–5.0)
Alkaline Phosphatase: 57 U/L (ref 38–126)
Anion gap: 7 (ref 5–15)
BUN: 35 mg/dL — ABNORMAL HIGH (ref 8–23)
CO2: 23 mmol/L (ref 22–32)
Calcium: 7.9 mg/dL — ABNORMAL LOW (ref 8.9–10.3)
Chloride: 111 mmol/L (ref 98–111)
Creatinine, Ser: 1.54 mg/dL — ABNORMAL HIGH (ref 0.61–1.24)
GFR, Estimated: 50 mL/min — ABNORMAL LOW (ref >60)
Glucose, Bld: 86 mg/dL (ref 70–99)
Potassium: 3.9 mmol/L (ref 3.5–5.1)
Sodium: 141 mmol/L (ref 135–145)
Total Bilirubin: 0.4 mg/dL (ref 0.3–1.2)
Total Protein: 5.9 g/dL — ABNORMAL LOW (ref 6.5–8.1)

## 2022-01-27 LAB — CBC
HCT: 32.2 % — ABNORMAL LOW (ref 39.0–52.0)
Hemoglobin: 11 g/dL — ABNORMAL LOW (ref 13.0–17.0)
MCH: 30.1 pg (ref 26.0–34.0)
MCHC: 34.2 g/dL (ref 30.0–36.0)
MCV: 88 fL (ref 80.0–100.0)
Platelets: 90 10*3/uL — ABNORMAL LOW (ref 150–400)
RBC: 3.66 MIL/uL — ABNORMAL LOW (ref 4.22–5.81)
RDW: 14.1 % (ref 11.5–15.5)
WBC: 4.8 10*3/uL (ref 4.0–10.5)
nRBC: 0 % (ref 0.0–0.2)

## 2022-01-27 LAB — GLUCOSE, CAPILLARY
Glucose-Capillary: 87 mg/dL (ref 70–99)
Glucose-Capillary: 80 mg/dL (ref 70–99)
Glucose-Capillary: 105 mg/dL — ABNORMAL HIGH (ref 70–99)
Glucose-Capillary: 119 mg/dL — ABNORMAL HIGH (ref 70–99)
Glucose-Capillary: 123 mg/dL — ABNORMAL HIGH (ref 70–99)

## 2022-01-27 NOTE — Plan of Care (Signed)
  Problem: Health Behavior/Discharge Planning: Goal: Ability to manage health-related needs will improve Outcome: Progressing   Problem: Clinical Measurements: Goal: Ability to maintain clinical measurements within normal limits will improve Outcome: Progressing Goal: Will remain free from infection Outcome: Progressing Goal: Diagnostic test results will improve Outcome: Progressing   Problem: Coping: Goal: Level of anxiety will decrease Outcome: Progressing   Problem: Safety: Goal: Ability to remain free from injury will improve Outcome: Progressing

## 2022-01-27 NOTE — Progress Notes (Signed)
Hypoglycemic Event  CBG: 60  Treatment: 4 oz of soda (ginger ale)  Symptoms: none  Follow-up CBG: Time: 2318 CBG Result:105  Possible Reasons for Event: Inadequate meal intake  Comments/MD notified: A. Zebedee Iba, NP    Abagail Kitchens

## 2022-01-27 NOTE — Progress Notes (Signed)
  Progress Note   Patient: Peter Parsons [id number] DOB: 1960-09-15 DOA: 10/23/2021     3 DOS: the patient was seen and examined on 01/27/2022   Brief hospital course: 61 year old M with PMH of schizophrenia, CKD-3A,  HTN, BPH and prostate cancer who was seen in ED waiting placement for the past 3 months and developed AKI and UTI for which hospitalist service was consulted for admission.  Patient had a fever to 101.3 on 12/12.  He had some dysuria and concentrated and dusky urine.  Hemodynamically stable.  UA concerning for UTI. Cr 3.37 (1.6 in 10/2021).  Urine culture and blood culture obtained.  Patient was started on IV fluid and IV ceftriaxone and admitted.   Assessment and Plan: AKI on CKD-3A/azotemia: Likely prerenal.  Improving. -Change IV NS to half NS due to hypernatremia. -Avoid nephrotoxic meds -Treat UTI, cont abx -Renal function improving -recheck bmet in AM   Acute urinary tract infection with hematuria: Had fever, dysuria and turbid urine.  UA concerning.  Urine culture with GNR.  Blood cultures NGTD.  No CVA tenderness. -was on empiric IV ceftriaxone. Urine cx reviewed, now pos for enterobacter, now on cefepime   Essential hypertension: Normotensive. -Continue atenolol 25 mg p.o. bedtime.   Mild hyperglycemia: Not diabetic. -Check hemoglobin A1c   BPH with LUTS in the setting of UTI -Continue tamsulosin 0.4 mg daily. -Antibiotics as above.   Thrombocytopenia (HCC) -Monitor platelet count.   Mild hyponatremia: Na 146. -Change IV NS to half NS   Paranoid schizophrenia (HCC) -Continue clozapine 300 mg p.o. daily, lorazepam 2 mg twice daily and as needed, Zoloft 200 mg daily. -Continue gabapentin at night. -Still awaiting placement to group home -On afternoon of 12/17, staff reports pt made comment about wishing to intentionally hold breath until he died.  -Continue one-to-one safety sitter -Re-consulted psych   Body mass index is 28.84 kg/m.       Subjective: In better spirits when seen this AM  Physical Exam: Vitals:   01/26/22 2213 01/27/22 0448 01/27/22 0821 01/27/22 1417  BP: (!) 160/84 136/68 135/84 134/83  Pulse: 67 (!) 56 (!) 57 71  Resp: '16 16 15 18  '$ Temp: (!) 100.4 F (38 C) 99.1 F (37.3 C) 98 F (36.7 C) 99.4 F (37.4 C)  TempSrc: Oral Oral    SpO2: 95% 98% 97% 98%  Weight:      Height:       General exam: Conversant, in no acute distress Respiratory system: normal chest rise, clear, no audible wheezing Cardiovascular system: regular rhythm, s1-s2 Gastrointestinal system: Nondistended, nontender, pos BS Central nervous system: No seizures, no tremors Extremities: No cyanosis, no joint deformities Skin: No rashes, no pallor Psychiatry: Affect normal // no auditory hallucinations    Data Reviewed:  Labs reviewed: Na, 141, K 3.9, Cr 1.54, WBC 4.8  Family Communication: Pt in room, family not at bedside   Disposition: Status is: Inpatient Remains inpatient appropriate because: Severity of illness  Planned Discharge Destination:  Group Home     Author: Marylu Lund, MD 01/27/2022 3:39 PM  For on call review [email or web address].

## 2022-01-27 NOTE — Progress Notes (Signed)
   01/27/22 1300  Clinical Encounter Type  Visited With Patient  Visit Type Follow-up;Spiritual support;Social support  Referral From Nurse  Consult/Referral To [place] text;Literature;Prayer;Ritual;Emotional  Stress Factors  Patient Stress Factors Family relationships;Lack of caregivers;Lack of knowledge;Loss;Loss of control   Met with patient and addressed his emotional concdern.s  Sxpoke of his medical journey and mental health journey.  Provided spiritual care and counsel.

## 2022-01-27 NOTE — Plan of Care (Signed)
  Problem: Clinical Measurements: Goal: Ability to maintain clinical measurements within normal limits will improve Outcome: Progressing   Problem: Safety: Goal: Ability to remain free from injury will improve Outcome: Progressing   Problem: Metabolic: Goal: Ability to maintain appropriate glucose levels will improve Outcome: Progressing

## 2022-01-28 DIAGNOSIS — F200 Paranoid schizophrenia: Secondary | ICD-10-CM | POA: Diagnosis not present

## 2022-01-28 DIAGNOSIS — R440 Auditory hallucinations: Secondary | ICD-10-CM | POA: Diagnosis not present

## 2022-01-28 DIAGNOSIS — R45851 Suicidal ideations: Secondary | ICD-10-CM | POA: Diagnosis not present

## 2022-01-28 LAB — COMPREHENSIVE METABOLIC PANEL
ALT: 14 U/L (ref 0–44)
AST: 16 U/L (ref 15–41)
Albumin: 3.2 g/dL — ABNORMAL LOW (ref 3.5–5.0)
Alkaline Phosphatase: 61 U/L (ref 38–126)
Anion gap: 6 (ref 5–15)
BUN: 42 mg/dL — ABNORMAL HIGH (ref 8–23)
CO2: 26 mmol/L (ref 22–32)
Calcium: 8.2 mg/dL — ABNORMAL LOW (ref 8.9–10.3)
Chloride: 109 mmol/L (ref 98–111)
Creatinine, Ser: 1.69 mg/dL — ABNORMAL HIGH (ref 0.61–1.24)
GFR, Estimated: 45 mL/min — ABNORMAL LOW (ref >60)
Glucose, Bld: 90 mg/dL (ref 70–99)
Potassium: 3.8 mmol/L (ref 3.5–5.1)
Sodium: 141 mmol/L (ref 135–145)
Total Bilirubin: 0.5 mg/dL (ref 0.3–1.2)
Total Protein: 6.4 g/dL — ABNORMAL LOW (ref 6.5–8.1)

## 2022-01-28 LAB — CBC
HCT: 33.2 % — ABNORMAL LOW (ref 39.0–52.0)
Hemoglobin: 11.3 g/dL — ABNORMAL LOW (ref 13.0–17.0)
MCH: 29.3 pg (ref 26.0–34.0)
MCHC: 34 g/dL (ref 30.0–36.0)
MCV: 86 fL (ref 80.0–100.0)
Platelets: 101 10*3/uL — ABNORMAL LOW (ref 150–400)
RBC: 3.86 MIL/uL — ABNORMAL LOW (ref 4.22–5.81)
RDW: 13.8 % (ref 11.5–15.5)
WBC: 6.1 10*3/uL (ref 4.0–10.5)
nRBC: 0 % (ref 0.0–0.2)

## 2022-01-28 LAB — GLUCOSE, CAPILLARY
Glucose-Capillary: 87 mg/dL (ref 70–99)
Glucose-Capillary: 101 mg/dL — ABNORMAL HIGH (ref 70–99)
Glucose-Capillary: 107 mg/dL — ABNORMAL HIGH (ref 70–99)
Glucose-Capillary: 121 mg/dL — ABNORMAL HIGH (ref 70–99)

## 2022-01-28 MED ORDER — SODIUM CHLORIDE 0.9 % IV SOLN
2.0000 g | INTRAVENOUS | Status: DC
  Administered 2022-01-28 – 2022-01-29 (×2): 2 g via INTRAVENOUS
  Filled 2022-01-28 (×2): qty 12.5

## 2022-01-28 NOTE — Consult Note (Signed)
[facility] New Face-to-Face Psychiatric Evaluation   Service Date: January 28, 2022 LOS:  LOS: 4 days    Assessment  Peter Parsons is a 61 y.o. male admitted medically for 10/23/2021  6:11 PM for UTI and AKI. He carries the psychiatric diagnoses of schizophrenia and has a past medical history of  frequent UTIs.Psychiatry was consulted for a parasuicidal statement by Dr. Wyline Parsons.    His current presentation of suicidal ideation with unrealistic plan is overall most consistent with his baseline mental status. Current outpatient psychotropic medications include clozapine, aripiprazole, lorazepam, and sertraline and historically he has had a fair response to these medications. He has a long history of treatment-resistant schizophrenia, and in addition to current medications has required 4 courses of ECT through the course of his illness (per pt). He has had multiple inpatient psychiatric hospitalizations including a recent 11 month stay at [facility] from which he was discharged in August; he was discharged from his group home 3 days later and after a brief stay in respite care entered the [facility] ED on 10/24/2021; at which time he notably had suicidal ideation with plan to hold breath.  Have reviewed several past psychiatric notes from 2020-2023; seems to have decompensated after long period of stability living with his parents (when they declined he entered a group home and did not do well) with worsening cognition and suspicion for dementia superimposed upon schizophrenia. As far back as Jan 2021 there has been significant concern for wandering behavior, etc with the cognitive decline and pt was recommended to go to a locked unit. MOCA was 19 in 2021 or 2022, haven't seen a repeat done this admission. Main barrier to placement has been finding a group home which can prevent elopement. Had been stable for some time and seemed to start decompensating in ED around 12/11.    He was  compliant with medications prior to admission as evidenced by [facility] from 3 month ED stay. On initial examination, patient expressed frustration with living with his chronic illness. Please see plan below for detailed recommendations.   Diagnoses:  Active Hospital problems: Principal Problem:   Paranoid schizophrenia ([place]) Active Problems:   Hypertension   BPH (benign prostatic hyperplasia)   Thrombocytopenia (HCC)   Acute urinary retention   AKI (acute kidney injury) ([place])   Hypernatremia     Plan  ## Safety and Observation Level:  - Based on my clinical evaluation, I estimate the patient to be at moderate risk of self harm in the current setting - At this time, we recommend a 1:1 level of observation especially as long as he has an IV. This decision is based on my review of the chart including patient's history and current presentation, interview of the patient, mental status examination, and consideration of suicide risk including evaluating suicidal ideation, plan, intent, suicidal or self-harm behaviors, risk factors, and protective factors. This judgment is based on our ability to directly address suicide risk, implement suicide prevention strategies and develop a safety plan while the patient is in the clinical setting. Please contact our team if there is a concern that risk level has changed.   ## Medications:  -- c clozapine 300 mg -- c sertraline 200 mg -- c lorazepam 2 BID (hx catatonia) Next dose of abilify due 02/16/2021.    ## Medical Decision Making Capacity:  -- pt has legal guardian  ## Further Work-up:  -- maybe consider clozapine level    -- most recent EKG on  9/13 had QtC of 453 -- Pertinent labwork reviewed earlier this admission includes: multiple CBC w/ diff with adequate neutrophils  ## Disposition:  -- pending - wonder if recent decompensation 2/2 delirium - keep sitter for now.     Thank you for this consult request. Recommendations have been  communicated to the primary team.  We will continue to follow at this time.   Peter Parsons   New history  Relevant Aspects of Hospital Course:  Presented to ED on 10/23/2021 for agitation. Had prolonged ED stay where he appeared to be at or near his psychiatric baseline for about 3 months. More recently, since about 12/11 has had more ruminations and written several sexually explicit notes. Ended up being admitted with UTI on 12/14.   Patient Report:  Pt seen in AM. He continues to report suicidal ideation with a plan to hold his breath, has tried this a couple of times in the last few weeks. Does not have any other plans at the moment. Does seem to have a fixed false belief that he is beyond hope and talks about past mistakes he wants to [facility] for, but upon further exploration is expressing frustration at prolonged inpatient stay. We talked briefly about his history with ECT - feels he is not as sick as he had been when this was needed and this helped to springboard to things going well for him. He appreciates visits from the chaplain and identifies himself as a person with strong faith. He feels that generally people are out to get him, but trusts his guardian and the staff here.  Perhaps has some thought broadcasting, but otherwise generally denied psychotic symptoms. He denied HI an AH/VH.   ROS:  As above  Collateral information:  Spoke to guardian at length. They were unaware he had been admitted. They speak to him approximately weekly. He had been at or near his baseline for some time, and has been expressing more frustration at prolonged hospitalization recently. They were apparently close to securing placement toward the tail end of his ED stay. Pt frequently expresses unrealistic plans for suicidality such as breathholding, drowning in a cup of water, etc. Discussed I would leave sitter on as long as pt has IV but re-evaluate.   Psychiatric History:  Information collected from  pt Hx schizophrenia, depression, catatonia,   Family psych history: unknown   Social History:  Has guardian Both parents elderly with dementia, decompensated since first group home placement in early 2020s.   Family History:  The patient's family history is not on file.  Medical History: Past Medical History:  Diagnosis Date   Diabetes mellitus without complication ([place])    Hypertension    Prostate cancer ([place])    Schizophrenia ([place])     Surgical History: Past Surgical History:  Procedure Laterality Date   INSERTION PROSTATE RADIATION SEED      Medications:   Current Facility-Administered Medications:    0.45 % sodium chloride infusion, , Intravenous, Continuous, Gonfa, Taye T, MD, Last Rate: 100 mL/hr at 01/28/22 1842, Infusion Verify at 01/28/22 1842   atenolol (TENORMIN) tablet 25 mg, 25 mg, Oral, QHS, Reubin Milan, MD, 25 mg at 01/27/22 2108   ceFEPIme (MAXIPIME) 2 g in sodium chloride 0.9 % 100 mL IVPB, 2 g, Intravenous, Q24H, Donne Hazel, MD, Last Rate: 200 mL/hr at 01/28/22 1431, 2 g at 01/28/22 1431   cloZAPine (CLOZARIL) tablet 300 mg, 300 mg, Oral, QHS, Reubin Milan, MD, 300 mg  at 01/27/22 2107   gabapentin (NEURONTIN) capsule 100 mg, 100 mg, Oral, BID, Reubin Milan, MD, 100 mg at 01/28/22 1034   LORazepam (ATIVAN) tablet 2 mg, 2 mg, Oral, BID, Reubin Milan, MD, 2 mg at 01/27/22 2106   LORazepam (ATIVAN) tablet 2 mg, 2 mg, Oral, Daily PRN, Reubin Milan, MD, 2 mg at 01/28/22 1429   nicotine (NICODERM CQ - dosed in mg/24 hours) patch 14 mg, 14 mg, Transdermal, Daily, Reubin Milan, MD, 14 mg at 01/28/22 1035   ondansetron (ZOFRAN) tablet 4 mg, 4 mg, Oral, Q6H PRN **OR** ondansetron (ZOFRAN) injection 4 mg, 4 mg, Intravenous, Q6H PRN, Reubin Milan, MD   Oral care mouth rinse, 15 mL, Mouth Rinse, PRN, Reubin Milan, MD   sertraline (ZOLOFT) tablet 200 mg, 200 mg, Oral, Daily, Reubin Milan, MD, 200 mg at  01/28/22 1034   tamsulosin [facility]) capsule 0.4 mg, 0.4 mg, Oral, Daily, Reubin Milan, MD, 0.4 mg at 01/28/22 1034  Allergies: Allergies  Allergen Reactions   Acetaminophen Other (See Comments)    Unknown Unable to breathe Chest  tightness/SOB        Objective  Vital signs:  Temp:  [98.6 F (37 C)-99.2 F (37.3 C)] 98.7 F (37.1 C) (12/18 1341) Pulse Rate:  [56-76] 67 (12/18 1341) Resp:  [15-16] 15 (12/18 1341) BP: (122-160)/(71-88) 123/80 (12/18 1341) SpO2:  [97 %-98 %] 98 % (12/18 1341)  Psychiatric Specialty Exam:  Presentation  General Appearance:  Fairly Groomed  Eye Contact: Fair  Speech: Clear and Coherent; Normal Rate  Speech Volume: Normal  Handedness:No data recorded  Mood and Affect  Mood: -- (Sad, frustrated)  Affect: Congruent (constricted)   Thought Process  Thought Processes: -- (Repetitive)  Descriptions of Associations:Loose  Orientation:Full (Time, Place and Person)  Thought Content:Rumination  History of Schizophrenia/Schizoaffective disorder:No data recorded Duration of Psychotic Symptoms:No data recorded Hallucinations:Hallucinations: None  Ideas of Reference:Paranoia  Suicidal Thoughts:Suicidal Thoughts: No  Homicidal Thoughts:Homicidal Thoughts: No   Sensorium  Memory: Immediate Fair; Recent Fair; Remote Fair  Judgment: Fair  Insight: Shallow   Executive Functions  Concentration: Fair  Attention Span: Fair  Recall: [place] of Knowledge: Fair  Language: Fair   Psychomotor Activity  Psychomotor Activity:Psychomotor Activity: Normal   Assets  Assets: Financial Resources/Insurance; Social Support   Sleep  Sleep:Sleep: Fair    Physical Exam: Physical Exam HENT:     Head: Normocephalic.  Pulmonary:     Effort: Pulmonary effort is normal.  Skin:    Comments: No visible rash  Neurological:     Mental Status: He is alert and oriented to person, place, and time.      Blood pressure 123/80, pulse 67, temperature 98.7 F (37.1 C), temperature source Oral, resp. rate 15, height '5\' 10"'$  (1.778 m), weight 91.2 kg, SpO2 98 %. Body mass index is 28.84 kg/m.

## 2022-01-28 NOTE — Progress Notes (Signed)
  Progress Note   Patient: Peter Parsons YOV:[id number] DOB: 10-Jun-1960 DOA: 10/23/2021     4 DOS: the patient was seen and examined on 01/28/2022   Brief hospital course: 61 year old M with PMH of schizophrenia, CKD-3A,  HTN, BPH and prostate cancer who was seen in ED waiting placement for the past 3 months and developed AKI and UTI for which hospitalist service was consulted for admission.  Patient had a fever to 101.3 on 12/12.  He had some dysuria and concentrated and dusky urine.  Hemodynamically stable.  UA concerning for UTI. Cr 3.37 (1.6 in 10/2021).  Urine culture and blood culture obtained.  Patient was started on IV fluid and IV ceftriaxone and admitted.   Assessment and Plan: AKI on CKD-3A/azotemia: Likely prerenal.  Improving. -Change IV NS to half NS due to hypernatremia. -Avoid nephrotoxic meds -Treat UTI, cont abx -Renal function stable -recheck bmet in AM   Acute urinary tract infection with hematuria: Had fever, dysuria and turbid urine.  UA concerning.  Urine culture with GNR.  Blood cultures NGTD.  No CVA tenderness. -was on empiric IV ceftriaxone. Urine cx reviewed, now pos for enterobacter, now on cefepime   Essential hypertension: Normotensive. -Continue atenolol 25 mg p.o. bedtime.   Mild hyperglycemia: Not diabetic. -Check hemoglobin A1c   BPH with LUTS in the setting of UTI -Continue tamsulosin 0.4 mg daily. -Antibiotics as above.   Thrombocytopenia (HCC) -Monitor platelet count.   Mild hyponatremia: Na 146. -Change IV NS to half NS   Paranoid schizophrenia (HCC) -Continue clozapine 300 mg p.o. daily, lorazepam 2 mg twice daily and as needed, Zoloft 200 mg daily. -Continue gabapentin at night. -Still awaiting placement to group home -On afternoon of 12/17, staff reports pt made comment about wishing to intentionally hold breath until he died.  -Continue one-to-one [employer] cabin crew -Had reconsulted psych   Body mass index is 28.84 kg/m.       Subjective:When seen this AM, denied further thoughts of self-harm, however did have thoughts yesterday with plan of holding breath until dead  Physical Exam: Vitals:   01/27/22 2108 01/27/22 2109 01/28/22 0607 01/28/22 1341  BP: (!) 160/88 (!) 160/88 122/71 123/80  Pulse: 74 76 (!) 56 67  Resp:  '16 15 15  '$ Temp:  99.2 F (37.3 C) 98.6 F (37 C) 98.7 F (37.1 C)  TempSrc:  Oral Oral Oral  SpO2:  97% 97% 98%  Weight:      Height:       General exam: Awake, laying in bed, in nad Respiratory system: Normal respiratory effort, no wheezing Cardiovascular system: regular rate, s1, s2 Gastrointestinal system: Soft, nondistended, positive BS Central nervous system: CN2-12 grossly intact, strength intact Extremities: Perfused, no clubbing Skin: Normal skin turgor, no notable skin lesions seen Psychiatry: Mood normal // no visual hallucinations    Data Reviewed:  Labs reviewed: Na, 141, K 3.8, Cr 1.68, WBC 6.1  Family Communication: Pt in room, family not at bedside   Disposition: Status is: Inpatient Remains inpatient appropriate because: Severity of illness  Planned Discharge Destination:  Group Home     Author: Marylu Lund, MD 01/28/2022 5:16 PM  For on call review [email or web address].

## 2022-01-28 NOTE — Progress Notes (Signed)
PHARMACY NOTE:  ANTIMICROBIAL RENAL DOSAGE ADJUSTMENT  Current antimicrobial regimen includes a mismatch between antimicrobial dosage and estimated renal function.  As per policy approved by the Pharmacy & Therapeutics and Medical Executive Committees, the antimicrobial dosage will be adjusted accordingly.  Current antimicrobial dosage: Cefepime 1g IV q24h  Indication: UTI  Renal Function:  Estimated Creatinine Clearance: 50.8 mL/min (A) (by C-G formula based on SCr of 1.69 mg/dL (H)). '[]'$      On intermittent HD, scheduled: '[]'$      On CRRT    Antimicrobial dosage has been changed to:  Cefepime 2g IV q24h   Thank you for allowing pharmacy to be a part of this patient's care.  Luiz Ochoa, [facility] 01/28/2022 9:17 AM

## 2022-01-29 DIAGNOSIS — T63451A Toxic effect of venom of hornets, accidental (unintentional), initial encounter: Secondary | ICD-10-CM

## 2022-01-29 DIAGNOSIS — R45851 Suicidal ideations: Secondary | ICD-10-CM | POA: Diagnosis not present

## 2022-01-29 DIAGNOSIS — F200 Paranoid schizophrenia: Secondary | ICD-10-CM | POA: Diagnosis not present

## 2022-01-29 DIAGNOSIS — R440 Auditory hallucinations: Secondary | ICD-10-CM | POA: Diagnosis not present

## 2022-01-29 LAB — CBC
HCT: 34.3 % — ABNORMAL LOW (ref 39.0–52.0)
Hemoglobin: 11.8 g/dL — ABNORMAL LOW (ref 13.0–17.0)
MCH: 29.6 pg (ref 26.0–34.0)
MCHC: 34.4 g/dL (ref 30.0–36.0)
MCV: 86.2 fL (ref 80.0–100.0)
Platelets: 114 10*3/uL — ABNORMAL LOW (ref 150–400)
RBC: 3.98 MIL/uL — ABNORMAL LOW (ref 4.22–5.81)
RDW: 13.6 % (ref 11.5–15.5)
WBC: 6.4 10*3/uL (ref 4.0–10.5)
nRBC: 0 % (ref 0.0–0.2)

## 2022-01-29 LAB — COMPREHENSIVE METABOLIC PANEL WITH GFR
ALT: 15 U/L (ref 0–44)
AST: 19 U/L (ref 15–41)
Albumin: 3.3 g/dL — ABNORMAL LOW (ref 3.5–5.0)
Alkaline Phosphatase: 59 U/L (ref 38–126)
Anion gap: 9 (ref 5–15)
BUN: 36 mg/dL — ABNORMAL HIGH (ref 8–23)
CO2: 25 mmol/L (ref 22–32)
Calcium: 8.6 mg/dL — ABNORMAL LOW (ref 8.9–10.3)
Chloride: 110 mmol/L (ref 98–111)
Creatinine, Ser: 1.63 mg/dL — ABNORMAL HIGH (ref 0.61–1.24)
GFR, Estimated: 47 mL/min — ABNORMAL LOW
Glucose, Bld: 91 mg/dL (ref 70–99)
Potassium: 3.9 mmol/L (ref 3.5–5.1)
Sodium: 144 mmol/L (ref 135–145)
Total Bilirubin: 0.3 mg/dL (ref 0.3–1.2)
Total Protein: 6.6 g/dL (ref 6.5–8.1)

## 2022-01-29 LAB — GLUCOSE, CAPILLARY
Glucose-Capillary: 82 mg/dL (ref 70–99)
Glucose-Capillary: 97 mg/dL (ref 70–99)
Glucose-Capillary: 104 mg/dL — ABNORMAL HIGH (ref 70–99)
Glucose-Capillary: 115 mg/dL — ABNORMAL HIGH (ref 70–99)

## 2022-01-29 LAB — CULTURE, BLOOD (ROUTINE X 2)
Culture: NO GROWTH
Culture: NO GROWTH

## 2022-01-29 NOTE — Plan of Care (Signed)
?  Problem: Coping: ?Goal: Level of anxiety will decrease ?Outcome: Progressing ?  ?Problem: Safety: ?Goal: Ability to remain free from injury will improve ?Outcome: Progressing ?  ?

## 2022-01-29 NOTE — Progress Notes (Signed)
  Progress Note   Patient: Peter Parsons UEA:[id number] DOB: 30-Oct-1960 DOA: 10/23/2021     5 DOS: the patient was seen and examined on 01/29/2022   Brief hospital course: 61 year old M with PMH of schizophrenia, CKD-3A,  HTN, BPH and prostate cancer who was seen in ED waiting placement for the past 3 months and developed AKI and UTI for which hospitalist service was consulted for admission.  Patient had a fever to 101.3 on 12/12.  He had some dysuria and concentrated and dusky urine.  Hemodynamically stable.  UA concerning for UTI. Cr 3.37 (1.6 in 10/2021).  Urine culture and blood culture obtained.  Patient was started on IV fluid and IV ceftriaxone and admitted.   Assessment and Plan: AKI on CKD-3A/azotemia: Likely prerenal.  Improving. -Change IV NS to half NS due to hypernatremia. -Avoid nephrotoxic meds -Treat UTI, cont abx -Cr down to 1.63 -Recheck bmet in AM   Acute urinary tract infection with hematuria: Had fever, dysuria and turbid urine.  UA concerning.  Urine culture with GNR.  Blood cultures NGTD.  No CVA tenderness. -was on empiric IV ceftriaxone. Urine cx reviewed, now pos for enterobacter, now on cefepime, to complete abx on 12/21   Essential hypertension: Normotensive. -Continue atenolol 25 mg p.o. bedtime.   Mild hyperglycemia: Not diabetic. -Check hemoglobin A1c   BPH with LUTS in the setting of UTI -Continue tamsulosin 0.4 mg daily. -Antibiotics as above, to complete 12/21   Thrombocytopenia (HCC) -Monitor platelet count.   Mild hyponatremia: Na 146. -Change IV NS to half NS   Paranoid schizophrenia (HCC) -Continue clozapine 300 mg p.o. daily, lorazepam 2 mg twice daily and as needed, Zoloft 200 mg daily. -Continue gabapentin at night. -Still awaiting placement to group home, TOC following -On afternoon of 12/17, staff reports pt made comment about wishing to intentionally hold breath until he died.  -Continue one-to-one [employer] cabin crew -Appreciate input by  Psychiatry who will cont to follow   Body mass index is 28.84 kg/m.      Subjective:Only concern is "am I going to die?" Pt reassured that he is being closely monitored  Physical Exam: Vitals:   01/28/22 1341 01/28/22 2123 01/29/22 0609 01/29/22 1434  BP: 123/80 (!) 165/88 124/71 (!) 147/71  Pulse: 67 68 (!) 51 (!) 55  Resp: '15 18 18 17  '$ Temp: 98.7 F (37.1 C) 98.4 F (36.9 C) (!) 97.4 F (36.3 C) 98 F (36.7 C)  TempSrc: Oral Oral Axillary Oral  SpO2: 98% 95% 98% 99%  Weight:      Height:       General exam: Conversant, in no acute distress Respiratory system: normal chest rise, clear, no audible wheezing Cardiovascular system: regular rhythm, s1-s2 Gastrointestinal system: Nondistended, nontender, pos BS Central nervous system: No seizures, no tremors Extremities: No cyanosis, no joint deformities Skin: No rashes, no pallor Psychiatry: Affect normal // no auditory hallucinations   Data Reviewed:  Labs reviewed: Na, 144, K 3.9, Cr 1.63, WBC 6.4  Family Communication: Pt in room, family not at bedside   Disposition: Status is: Inpatient Remains inpatient appropriate because: Severity of illness  Planned Discharge Destination:  Group Home     Author: Marylu Lund, MD 01/29/2022 2:39 PM  For on call review [email or web address].

## 2022-01-29 NOTE — TOC Initial Note (Signed)
Transition of Care [facility]) - Initial/Assessment Note   Patient Details  Name: Peter Parsons MRN: [id number] Date of Birth: [birth year]  Transition of Care [facility]) CM/SW Contact:    Sherie Don, LCSW Phone Number: 01/29/2022, 1:58 PM  Clinical Narrative: TOC notified that Deloria Lair will have a meeting tomorrow (12/20) regarding patient's authorization and funding as the funding is required for discharge to a group home. TOC to be updated after meeting.  Expected Discharge Plan: Group Home Barriers to Discharge: Financial Resources, Other (must enter comment) (Awaiting group home placement which is contingent on funding)  Patient Goals and CMS Choice Patient states their goals for this hospitalization and ongoing recovery are:: Discharge to group home when funding is available CMS Medicare.gov Compare Post Acute Care list provided to:: Patient Represenative (must comment) Choice offered to / list presented to : [place] / Guardian  Expected Discharge Plan and Services Expected Discharge Plan: Group Home In-house Referral: Clinical Social Work Post Acute Care Choice: NA Living arrangements for the past 2 months: No permanent address (Patient has been in the [place] [facility] ED for approximately 3 months.)            DME Arranged: N/A DME Agency: NA  Prior Living Arrangements/Services Living arrangements for the past 2 months: No permanent address (Patient has been in the [place] ED for approximately 3 months.) Patient language and need for interpreter reviewed:: Yes Need for Family Participation in Patient Care: Yes (Comment) (Patient has legal guardian.) Care giver support system in place?: Yes (comment) Criminal Activity/Legal Involvement Pertinent to Current Situation/Hospitalization: No - Comment as needed  Activities of Daily Living Home Assistive Devices/Equipment: None ADL Screening (condition at time of admission) Patient's cognitive ability adequate to safely complete daily  activities?: Yes Is the patient deaf or have difficulty hearing?: No Does the patient have difficulty seeing, even when wearing glasses/contacts?: No Does the patient have difficulty concentrating, remembering, or making decisions?: Yes Patient able to express need for assistance with ADLs?: Yes Does the patient have difficulty dressing or bathing?: No Independently performs ADLs?: Yes (appropriate for developmental age) Does the patient have difficulty walking or climbing stairs?: No Weakness of Legs: None Weakness of Arms/Hands: None  Emotional Assessment Orientation: : Oriented to Self, Oriented to Place, Oriented to  Time, Oriented to Situation Alcohol / Substance Use: Not Applicable Psych Involvement: Yes (comment)  Admission diagnosis:  Suicidal ideation [R45.851] Auditory hallucination [R44.0] AKI (acute kidney injury) ([place]) [N17.9] Hornet sting, accidental or unintentional, initial encounter [Y85.027X] Patient Active Problem List   Diagnosis Date Noted   Hypernatremia 01/25/2022   AKI (acute kidney injury) ([place]) 01/24/2022   Enterococcal bacteremia 05/15/2019   Hypertension 05/15/2019   Dyslipidemia 05/15/2019   BPH (benign prostatic hyperplasia) 05/15/2019   Normocytic anemia 05/15/2019   Thrombocytopenia ([place]) 05/15/2019   Acute urinary retention 05/15/2019   Urethral trauma 05/15/2019   UTI (urinary tract infection) 05/15/2019   Pressure injury of skin 41/28/7867   Acute metabolic encephalopathy 67/20/9470   Protein-calorie malnutrition, severe 03/29/2019   Weakness 03/27/2019   Dementia ([place]) 03/19/2019   Altered mental status    Paranoid schizophrenia ([place])    CKD (chronic kidney disease), stage III ([place]) 05/15/2018   Bipolar 1 disorder ([place]) 04/28/2013   PCP:  System, Provider Not In Pharmacy:   [facility] [place], [place] [address] [place] [address] Phone: [phone]  Fax: [phone]  [place], [place] [address] Phone: [phone] Fax: [place], [place] [address] [place] [address] Phone: [phone] Fax: [phone]  Social Determinants of Health (SDOH) Interventions    Readmission Risk Interventions     No data to display

## 2022-01-29 NOTE — Plan of Care (Signed)
°  Problem: Coping: °Goal: Level of anxiety will decrease °Outcome: Progressing °  °

## 2022-01-29 NOTE — TOC Progression Note (Signed)
Transition of Care [facility]) - Progression Note    Patient Details  Name: Peter Parsons MRN: [id number] Date of Birth: November 03, 1960  [facility]) CM/SW Contact  Joaquin Courts, RN Phone Number: 01/29/2022, 11:27 AM  Clinical Narrative:  Peter Parsons has an internal meeting scheduled for tomorrow 01/30/22 to discuss funding for multicultural group home placement,  CC to update TOC once meeting concludes.     Barriers to Discharge: Other (must enter comment) (Needs long term ALF Memory care per legal guradian)  Expected Discharge Plan and Services                                                 Social Determinants of Health (SDOH) Interventions    Readmission Risk Interventions     No data to display

## 2022-01-30 DIAGNOSIS — F200 Paranoid schizophrenia: Secondary | ICD-10-CM | POA: Diagnosis not present

## 2022-01-30 LAB — CBC
HCT: 34.4 % — ABNORMAL LOW (ref 39.0–52.0)
Hemoglobin: 12.2 g/dL — ABNORMAL LOW (ref 13.0–17.0)
MCH: 30.3 pg (ref 26.0–34.0)
MCHC: 35.5 g/dL (ref 30.0–36.0)
MCV: 85.4 fL (ref 80.0–100.0)
Platelets: 144 10*3/uL — ABNORMAL LOW (ref 150–400)
RBC: 4.03 MIL/uL — ABNORMAL LOW (ref 4.22–5.81)
RDW: 13.6 % (ref 11.5–15.5)
WBC: 5.9 10*3/uL (ref 4.0–10.5)
nRBC: 0 % (ref 0.0–0.2)

## 2022-01-30 LAB — COMPREHENSIVE METABOLIC PANEL
ALT: 19 U/L (ref 0–44)
AST: 25 U/L (ref 15–41)
Albumin: 3.4 g/dL — ABNORMAL LOW (ref 3.5–5.0)
Alkaline Phosphatase: 74 U/L (ref 38–126)
Anion gap: 7 (ref 5–15)
BUN: 41 mg/dL — ABNORMAL HIGH (ref 8–23)
CO2: 26 mmol/L (ref 22–32)
Calcium: 8.2 mg/dL — ABNORMAL LOW (ref 8.9–10.3)
Chloride: 110 mmol/L (ref 98–111)
Creatinine, Ser: 1.38 mg/dL — ABNORMAL HIGH (ref 0.61–1.24)
GFR, Estimated: 57 mL/min — ABNORMAL LOW (ref >60)
Glucose, Bld: 107 mg/dL — ABNORMAL HIGH (ref 70–99)
Potassium: 3.9 mmol/L (ref 3.5–5.1)
Sodium: 143 mmol/L (ref 135–145)
Total Bilirubin: 0.4 mg/dL (ref 0.3–1.2)
Total Protein: 6.5 g/dL (ref 6.5–8.1)

## 2022-01-30 LAB — GLUCOSE, CAPILLARY
Glucose-Capillary: 83 mg/dL (ref 70–99)
Glucose-Capillary: 110 mg/dL — ABNORMAL HIGH (ref 70–99)
Glucose-Capillary: 97 mg/dL (ref 70–99)
Glucose-Capillary: 97 mg/dL (ref 70–99)

## 2022-01-30 MED ORDER — SODIUM CHLORIDE 0.9 % IV SOLN
2.0000 g | Freq: Two times a day (BID) | INTRAVENOUS | Status: AC
  Administered 2022-01-30 – 2022-01-31 (×2): 2 g via INTRAVENOUS
  Filled 2022-01-30 (×2): qty 12.5

## 2022-01-30 NOTE — Progress Notes (Signed)
  Progress Note   Patient: Peter Parsons MVH:[id number] DOB: 09-21-1960 DOA: 10/23/2021     6 DOS: the patient was seen and examined on 01/30/2022   Brief hospital course: 61 year old M with PMH of schizophrenia, CKD-3A,  HTN, BPH and prostate cancer who was seen in ED waiting placement for the past 3 months and developed AKI and UTI for which hospitalist service was consulted for admission.  Patient had a fever to 101.3 on 12/12.  He had some dysuria and concentrated and dusky urine.  Hemodynamically stable.  UA concerning for UTI. Cr 3.37 (1.6 in 10/2021).  Urine culture and blood culture obtained.  Patient was started on IV fluid and IV ceftriaxone and admitted.   Assessment and Plan: AKI on CKD-3A/azotemia: Likely prerenal.  Improving. -Change IV NS to half NS due to hypernatremia--rate now 50 cc/h -Avoid nephrotoxic meds -Treat UTI, cont abx -Cr improved  Acute urinary tract infection 2/2 enterobacter with hematuria:  - Urine culture with GNR.  Blood cultures NGTD.  No CVA tenderness. - now on cefepime, to complete abx on 12/21   Essential hypertension: Normotensive. -Continue atenolol 25 mg p.o. bedtime.   Mild hyperglycemia: Not diabetic. -Check hemoglobin A1c   BPH with LUTS in the setting of UTI -Continue tamsulosin 0.4 mg daily. -Antibiotics as above, to complete 12/21   Thrombocytopenia (HCC) -Monitor platelet count-get weekly Diff as is on Clozapine   hypernatremia -Change IV NS to half NS, resolved   Paranoid schizophrenia ([place]) -Continue clozapine 300 mg p.o. daily, lorazepam 2 mg twice daily and as needed, Zoloft 200 mg daily. -Continue gabapentin at night. -Still awaiting placement to group home, TOC following -On afternoon of 12/17, staff reports pt made comment about wishing to intentionally hold breath until he died.  -Continue one-to-one Air cabin crew -Appreciate input by Psychiatry    Body mass index is 28.84 kg/m.      Subjective:  Awake coherent in  nad Looks comfortable no dysuria, no pain fever chills   Physical Exam: Vitals:   01/29/22 1434 01/29/22 2207 01/30/22 0614 01/30/22 1410  BP: (!) 147/71 (!) 167/82 120/70 (!) 160/93  Pulse: (!) 55 (!) 52 (!) 55 73  Resp: '17 18 16 19  '$ Temp: 98 F (36.7 C) (!) 97.5 F (36.4 C) 97.8 F (36.6 C) 98.3 F (36.8 C)  TempSrc: Oral Axillary Axillary Oral  SpO2: 99% 100% 95% 98%  Weight:      Height:       Eomi ncat no focal deficit Chest clea rno rales rhonchi wheeze Abd soft ntnd no rebound no guard No Le edema Rom intact  Data Reviewed:  Labs reviewed: Na, 143, K 3.9, Cr 1.3, WBC 5.9  Family Communication: Pt in room, family not at bedside   Disposition: Status is: Inpatient Remains inpatient appropriate because: Severity of illness  Planned Discharge Destination:  Group Home     Author: Nita Sells, MD 01/30/2022 4:01 PM  For on call review [email or web address].

## 2022-01-30 NOTE — Plan of Care (Signed)
°  Problem: Coping: °Goal: Level of anxiety will decrease °Outcome: Progressing °  °

## 2022-01-30 NOTE — Progress Notes (Signed)
PHARMACY NOTE:  ANTIMICROBIAL RENAL DOSAGE ADJUSTMENT  Current antimicrobial regimen includes a mismatch between antimicrobial dosage and estimated renal function.  As per policy approved by the Pharmacy & Therapeutics and Medical Executive Committees, the antimicrobial dosage will be adjusted accordingly.  Current antimicrobial dosage: Cefepime 2g IV q24h  Indication: UTI  Renal Function:  Estimated Creatinine Clearance: 62.2 mL/min (A) (by C-G formula based on SCr of 1.38 mg/dL (H)). '[]'$      On intermittent HD, scheduled: '[]'$      On CRRT    Antimicrobial dosage has been changed to:  Cefepime 2g IV q12h x 2 more doses to complete 5 day course   Thank you for allowing pharmacy to be a part of this patient's care.  Luiz Ochoa, [facility] 01/30/2022 12:39 PM

## 2022-01-30 NOTE — Consult Note (Signed)
[facility] New Face-to-Face Psychiatric Evaluation   Service Date: January 30, 2022 LOS:  LOS: 6 days    Assessment  Peter Parsons is a 61 y.o. male admitted medically for 10/23/2021  6:11 PM for UTI and AKI. He carries the psychiatric diagnoses of schizophrenia and has a past medical history of  frequent UTIs.Psychiatry was consulted for a parasuicidal statement by Dr. Wyline Parsons.    His current presentation of suicidal ideation with unrealistic plan is overall most consistent with his baseline mental status. Current outpatient psychotropic medications include clozapine, aripiprazole, lorazepam, and sertraline and historically he has had a fair response to these medications. He has a long history of treatment-resistant schizophrenia, and in addition to current medications has required 4 courses of ECT through the course of his illness (per pt). He has had multiple inpatient psychiatric hospitalizations including a recent 11 month stay at [facility] from which he was discharged in August; he was discharged from his group home 3 days later and after a brief stay in respite care entered the [facility] ED on 10/24/2021; at which time he notably had suicidal ideation with plan to hold breath.  Have reviewed several past psychiatric notes from 2020-2023; seems to have decompensated after long period of stability living with his parents (when they declined he entered a group home and did not do well) with worsening cognition and suspicion for dementia superimposed upon schizophrenia. As far back as Jan 2021 there has been significant concern for wandering behavior, etc with the cognitive decline and pt was recommended to go to a locked unit. MOCA was 19 in 2021 or 2022, haven't seen a repeat done this admission. Main barrier to placement has been finding a group home which can prevent elopement. Had been stable for some time and seemed to start decompensating in ED around 12/11 based on some  nursing notes, which is probably around the time he developed the UTI. I think he is around his baseline mental status. Will leave sitter until IV is removed.    He was compliant with medications prior to admission as evidenced by [facility] from 3 month ED stay. On initial examination, patient expressed frustration with living with his chronic illness. Please see plan below for detailed recommendations.   Diagnoses:  Active Hospital problems: Principal Problem:   Paranoid schizophrenia ([place]) Active Problems:   Hypertension   BPH (benign prostatic hyperplasia)   Thrombocytopenia (HCC)   Acute urinary retention   AKI (acute kidney injury) ([place])   Hypernatremia     Plan  ## Safety and Observation Level:  - Based on my clinical evaluation, I estimate the patient to be at moderate risk of self harm in the current setting - At this time, we recommend a 1:1 level of observation especially as long as he has an IV. This decision is based on my review of the chart including patient's history and current presentation, interview of the patient, mental status examination, and consideration of suicide risk including evaluating suicidal ideation, plan, intent, suicidal or self-harm behaviors, risk factors, and protective factors. This judgment is based on our ability to directly address suicide risk, implement suicide prevention strategies and develop a safety plan while the patient is in the clinical setting. Please contact our team if there is a concern that risk level has changed.   ## Medications:  -- c clozapine 300 mg -- c sertraline 200 mg -- c lorazepam 2 BID (hx catatonia) Next dose of abilify due 02/16/2021.    ##  Medical Decision Making Capacity:  -- pt has legal guardian  ## Further Work-up:  -- maybe consider clozapine level if decompensates, seems to be responding well   -- most recent EKG on 9/13 had QtC of 453 -- Pertinent labwork reviewed earlier this admission includes: multiple CBC  w/ diff with adequate neutrophils  ## Disposition:  -- pending - wonder if recent decompensation 2/2 delirium - keep sitter for now.     Thank you for this consult request. Recommendations have been communicated to the primary team.  We will continue to follow at this time.   [place] A Peter Parsons   New history  Relevant Aspects of Hospital Course:  Presented to ED on 10/23/2021 for agitation. Had prolonged ED stay where he appeared to be at or near his psychiatric baseline for about 3 months. More recently, since about 12/11 has had more ruminations and written several sexually explicit notes. Ended up being admitted with UTI on 12/14.   Patient Report:  Pt seen in PM. He was up out of bed, and was very pleasant. States "I have dementia real bad". He is mostly oriented to self and situation. Describes his biggest regret was absconding from nursing home, because he is now stuck here. He was trying to find his 31 y/o father who he hasn't been in touch with for some time. Apparently he tried to apply for a job at a [employer] while he was eloped. He continues to have intermittent SI with plan to hold breath, but understands this would not work. Expresses frustration that the phone cord was taken out because of these statements "that wouldn't work either". Has never really had a realistic plan talking to him or his guardian. He is hoping that getting admitted medically will not prevent him from leaving the hospital to a nursing home. No real SI, HI, AH/VH.Generally denied psychotic sx.   ROS:  As above  Collateral information:  Spoke to guardian at length. They were unaware he had been admitted. They speak to him approximately weekly. He had been at or near his baseline for some time, and has been expressing more frustration at prolonged hospitalization recently. They were apparently close to securing placement toward the tail end of his ED stay. Pt frequently expresses unrealistic plans for  suicidality such as breathholding, drowning in a cup of water, etc. Discussed I would leave sitter on as long as pt has IV but re-evaluate.   Psychiatric History:  Information collected from pt Hx schizophrenia, depression, catatonia,   Family psych history: unknown   Social History:  Has guardian Both parents elderly with dementia, decompensated since first group home placement in early 2020s.   Family History:  The patient's family history is not on file.  Medical History: Past Medical History:  Diagnosis Date   Diabetes mellitus without complication ([place])    Hypertension    Prostate cancer ([place])    Schizophrenia ([place])     Surgical History: Past Surgical History:  Procedure Laterality Date   INSERTION PROSTATE RADIATION SEED      Medications:   Current Facility-Administered Medications:    0.45 % sodium chloride infusion, , Intravenous, Continuous, Samtani, Jai-Gurmukh, MD, Last Rate: 75 mL/hr at 01/30/22 1459, New Bag at 01/30/22 1459   atenolol (TENORMIN) tablet 25 mg, 25 mg, Oral, QHS, Reubin Milan, MD, 25 mg at 01/29/22 2240   ceFEPIme (MAXIPIME) 2 g in sodium chloride 0.9 % 100 mL IVPB, 2 g, Intravenous, Q12H, Nita Sells, MD, Last Rate:  200 mL/hr at 01/30/22 1500, 2 g at 01/30/22 1500   cloZAPine (CLOZARIL) tablet 300 mg, 300 mg, Oral, QHS, Reubin Milan, MD, 300 mg at 01/29/22 2240   gabapentin (NEURONTIN) capsule 100 mg, 100 mg, Oral, BID, Reubin Milan, MD, 100 mg at 01/30/22 1007   LORazepam (ATIVAN) tablet 2 mg, 2 mg, Oral, BID, Reubin Milan, MD, 2 mg at 01/30/22 1007   LORazepam (ATIVAN) tablet 2 mg, 2 mg, Oral, Daily PRN, Reubin Milan, MD, 2 mg at 01/28/22 1429   nicotine (NICODERM CQ - dosed in mg/24 hours) patch 14 mg, 14 mg, Transdermal, Daily, Reubin Milan, MD, 14 mg at 01/30/22 1008   ondansetron (ZOFRAN) tablet 4 mg, 4 mg, Oral, Q6H PRN **OR** ondansetron (ZOFRAN) injection 4 mg, 4 mg, Intravenous, Q6H  PRN, Reubin Milan, MD   Oral care mouth rinse, 15 mL, Mouth Rinse, PRN, Reubin Milan, MD   sertraline (ZOLOFT) tablet 200 mg, 200 mg, Oral, Daily, Reubin Milan, MD, 200 mg at 01/30/22 1007   tamsulosin [facility]) capsule 0.4 mg, 0.4 mg, Oral, Daily, Reubin Milan, MD, 0.4 mg at 01/30/22 1007  Allergies: Allergies  Allergen Reactions   Acetaminophen Other (See Comments)    Unknown Unable to breathe Chest  tightness/SOB        Objective  Vital signs:  Temp:  [97.5 F (36.4 C)-98.3 F (36.8 C)] 98.3 F (36.8 C) (12/20 1410) Pulse Rate:  [52-73] 73 (12/20 1410) Resp:  [16-19] 19 (12/20 1410) BP: (120-167)/(70-93) 160/93 (12/20 1410) SpO2:  [95 %-100 %] 98 % (12/20 1410)  Psychiatric Specialty Exam:  Presentation  General Appearance:  Fairly Groomed  Eye Contact: Good  Speech: Clear and Coherent; Normal Rate  Speech Volume: Normal  Handedness:No data recorded  Mood and Affect  Mood: -- (Today is a good day)  Affect: Congruent   Thought Process  Thought Processes: -- (Repetitive)  Descriptions of Associations:Loose  Orientation:Full (Time, Place and Person)  Thought Content:Rumination  History of Schizophrenia/Schizoaffective disorder:No data recorded Duration of Psychotic Symptoms:No data recorded Hallucinations:Hallucinations: None   Ideas of Reference:-- (No paranoia today)  Suicidal Thoughts:Suicidal Thoughts: No   Homicidal Thoughts:Homicidal Thoughts: No    Sensorium  Memory: Immediate Fair; Recent Fair; Remote Fair  Judgment: Poor  Insight: Shallow   Executive Functions  Concentration: Fair  Attention Span: Fair  Recall: [place] of Knowledge: Fair  Language: Fair   Psychomotor Activity  Psychomotor Activity:Psychomotor Activity: Normal    Assets  Assets: Financial Resources/Insurance; Social Support   Sleep  Sleep:Sleep: Fair     Physical Exam: Physical Exam HENT:      Head: Normocephalic.  Pulmonary:     Effort: Pulmonary effort is normal.  Skin:    Comments: No visible rash  Neurological:     Mental Status: He is alert and oriented to person, place, and time.     Blood pressure (!) 160/93, pulse 73, temperature 98.3 F (36.8 C), temperature source Oral, resp. rate 19, height '5\' 10"'$  (1.778 m), weight 91.2 kg, SpO2 98 %. Body mass index is 28.84 kg/m.

## 2022-01-31 DIAGNOSIS — F200 Paranoid schizophrenia: Secondary | ICD-10-CM | POA: Diagnosis not present

## 2022-01-31 LAB — GLUCOSE, CAPILLARY
Glucose-Capillary: 106 mg/dL — ABNORMAL HIGH (ref 70–99)
Glucose-Capillary: 88 mg/dL (ref 70–99)
Glucose-Capillary: 123 mg/dL — ABNORMAL HIGH (ref 70–99)
Glucose-Capillary: 132 mg/dL — ABNORMAL HIGH (ref 70–99)

## 2022-01-31 LAB — CBC WITH DIFFERENTIAL/PLATELET
Abs Immature Granulocytes: 0.05 10*3/uL (ref 0.00–0.07)
Basophils Absolute: 0 10*3/uL (ref 0.0–0.1)
Basophils Relative: 1 %
Eosinophils Absolute: 0 10*3/uL (ref 0.0–0.5)
Eosinophils Relative: 0 %
HCT: 37.3 % — ABNORMAL LOW (ref 39.0–52.0)
Hemoglobin: 12.4 g/dL — ABNORMAL LOW (ref 13.0–17.0)
Immature Granulocytes: 1 %
Lymphocytes Relative: 37 %
Lymphs Abs: 2.4 10*3/uL (ref 0.7–4.0)
MCH: 28.9 pg (ref 26.0–34.0)
MCHC: 33.2 g/dL (ref 30.0–36.0)
MCV: 86.9 fL (ref 80.0–100.0)
Monocytes Absolute: 0.5 10*3/uL (ref 0.1–1.0)
Monocytes Relative: 8 %
Neutro Abs: 3.5 10*3/uL (ref 1.7–7.7)
Neutrophils Relative %: 53 %
Platelets: 157 10*3/uL (ref 150–400)
RBC: 4.29 MIL/uL (ref 4.22–5.81)
RDW: 14.1 % (ref 11.5–15.5)
WBC: 6.4 10*3/uL (ref 4.0–10.5)
nRBC: 0 % (ref 0.0–0.2)

## 2022-01-31 LAB — BASIC METABOLIC PANEL
Anion gap: 6 (ref 5–15)
BUN: 42 mg/dL — ABNORMAL HIGH (ref 8–23)
CO2: 26 mmol/L (ref 22–32)
Calcium: 8.6 mg/dL — ABNORMAL LOW (ref 8.9–10.3)
Chloride: 111 mmol/L (ref 98–111)
Creatinine, Ser: 1.56 mg/dL — ABNORMAL HIGH (ref 0.61–1.24)
GFR, Estimated: 50 mL/min — ABNORMAL LOW (ref >60)
Glucose, Bld: 85 mg/dL (ref 70–99)
Potassium: 4.3 mmol/L (ref 3.5–5.1)
Sodium: 143 mmol/L (ref 135–145)

## 2022-01-31 NOTE — Progress Notes (Signed)
  Progress Note   Patient: Peter Parsons [id number]:[id number] DOB: 11-10-1960 DOA: 10/23/2021     7 DOS: the patient was seen and examined on 01/31/2022   Brief hospital course: 61 year old M with PMH of schizophrenia, CKD-3A,  HTN, BPH and prostate cancer who was seen in ED waiting placement for the past 3 months and developed AKI and UTI for which hospitalist service was consulted for admission.  Patient had a fever to 101.3 on 12/12.  He had some dysuria and concentrated and dusky urine.  Hemodynamically stable.  UA concerning for UTI. Cr 3.37 (1.6 in 10/2021).  Urine culture and blood culture obtained.  Patient was started on IV fluid and IV ceftriaxone and admitted.   Assessment and Plan: AKI on CKD-3A/azotemia: Likely prerenal.  Improving. -DC IVF today, force oral fluids -Avoid nephrotoxic meds -Treat UTI, cont abx -Cr improved  Acute urinary tract infection 2/2 enterobacter with hematuria:  - Urine culture with GNR.  Blood cultures NGTD.  No CVA tenderness. - now on cefepime, to complete abx on 12/21   Essential hypertension: Normotensive. -Continue atenolol 25 mg p.o. bedtime.   Mild hyperglycemia: Not diabetic. -Check hemoglobin A1c   BPH with LUTS in the setting of UTI -Continue tamsulosin 0.4 mg daily. -Antibiotics as above, completed 12/21   Thrombocytopenia (HCC) -Monitor platelet count-get weekly Diff as is on Clozapine   hypernatremia -Resolved at this time   Paranoid schizophrenia (HCC) -Continue clozapine 300 mg p.o. daily, lorazepam 2 mg twice daily and as needed, Zoloft 200 mg daily. -Continue gabapentin at night. -Still awaiting placement to group home, TOC following -12/21 DC suicide precautions-no longer suicidal -Continue one-to-one Air cabin crew -Appreciate input by Psychiatry    Body mass index is 28.84 kg/m.      Subjective:  Coherent pleasant wants to go to group home no fever no chills drinking eating   Physical Exam: Vitals:   01/30/22 1410  01/30/22 2154 01/31/22 0604 01/31/22 1354  BP: (!) 160/93 (!) 140/86 125/68 (!) 144/88  Pulse: 73 67 (!) 53 67  Resp: '19 18 16 18  '$ Temp: 98.3 F (36.8 C) 98.5 F (36.9 C) 98 F (36.7 C) 98.1 F (36.7 C)  TempSrc: Oral Oral Oral Oral  SpO2: 98% 99% 97% 100%  Weight:      Height:       Eomi ncat no focal deficit Chest clea rno rales rhonchi wheeze Abd soft ntnd no rebound no guard No Le edema Rom intact  Data Reviewed:  Labs reviewed: Na, 143, K 4.3, Cr 1.5, WBC 56.4, ANC 3500  Family Communication: Pt in room, family not at bedside   Disposition: Status is: Inpatient Remains inpatient appropriate because: Severity of illness  Planned Discharge Destination:  Group Home     Author: Nita Sells, MD 01/31/2022 2:07 PM  For on call review [email or web address].

## 2022-01-31 NOTE — Plan of Care (Signed)
°  Problem: Coping: °Goal: Level of anxiety will decrease °Outcome: Progressing °  °

## 2022-01-31 NOTE — Consult Note (Signed)
[facility]  Service Date: January 31, 2022 LOS:  LOS: 7 days    Assessment  Peter [facility] is a 61 y.o. male admitted medically for 10/23/2021  6:11 PM for UTI and AKI. He carries the psychiatric diagnoses of schizophrenia and has a past medical history of  frequent UTIs.Psychiatry was consulted for a parasuicidal statement by [facility].    His current presentation of suicidal ideation with unrealistic plan is overall most consistent with his baseline mental status. Current outpatient psychotropic medications include clozapine, aripiprazole, lorazepam, and sertraline and historically he has had a fair response to these medications. He has a long history of treatment-resistant schizophrenia, and in addition to current medications has required 4 courses of ECT through the course of his illness (per pt). He has had multiple inpatient psychiatric hospitalizations including a recent 11 month stay at [facility] from which he was discharged in August; he was discharged from his group home 3 days later and after a brief stay in respite care entered the [facility] on 10/24/2021; at which time he notably had suicidal ideation with plan to hold breath.  Have reviewed several past psychiatric notes from 2020-2023; seems to have decompensated after long period of stability living with his parents (when they declined he entered a group home and did not do well) with worsening cognition and suspicion for dementia superimposed upon schizophrenia. As far back as Jan 2021 there has been significant concern for wandering behavior, etc with the cognitive decline and pt was recommended to go to a locked unit. MOCA was 19 in 2021 or 2022, haven't seen a repeat done this admission. Main barrier to placement has been finding a group home which can prevent elopement. Had been stable for some time and seemed to start decompensating in ED around 12/11 based on some  nursing notes, which is probably around the time he developed the UTI. I think he is around his baseline mental status. Will leave sitter until IV is removed.   He was compliant with medications prior to admission as evidenced by [facility] from 3 month ED stay. On initial examination, patient expressed frustration with living with his chronic illness. Please see plan below for detailed recommendations.   Diagnoses:  Active [facility]ons problems: Principal Problem:   Paranoid schizophrenia ([facility]) Active Problems:   Hypertension   BPH (benign prostatic hyperplasia)   Thrombocytopenia (HCC)   Acute urinary retention   AKI (acute kidney injury) ([facility])   Hypernatremia     Plan  ## Safety and Observation Level:  - Based on my clinical [facility], I estimate the patient to be at moderate risk of self harm in the current setting - At this time, we recommend a 1:1 level of observation especially as long as he has an IV. This decision is based on my review of the chart including patient's history and current presentation, interview of the patient, mental status examination, and consideration of suicide risk including evaluating suicidal ideation, plan, intent, suicidal or self-harm behaviors, risk factors, and protective factors. This judgment is based on our ability to directly address suicide risk, implement suicide prevention strategies and develop a safety plan while the patient is in the clinical setting. Please contact our team if there is a concern that risk level has changed.   ## Medications:  -- c clozapine 300 mg -- c sertraline 200 mg -- c lorazepam 2 BID (hx catatonia) Next dose of abilify due 02/16/2021.    ##  Medical Decision Making Capacity:  -- pt has legal guardian  ## Further Work-up:  -- maybe consider clozapine level if decompensates, seems to be responding well   -- most recent EKG on 9/13 had QtC of 453 -- Pertinent labwork reviewed earlier this admission includes: multiple CBC  w/ diff with adequate neutrophils  ## Disposition:  -- Psych cleared, delirium has resolved.   Thank you for this consult request. Recommendations have been communicated to the primary team.  We sign off  at this time.   Peter Broad, FNP   New history  Relevant Aspects of Parsons Course:  Presented to ED on 10/23/2021 for agitation. Had prolonged ED stay where he appeared to be at or near his psychiatric baseline for about 3 months. More recently, since about 12/11 has had more ruminations and written several sexually explicit notes. Ended up being admitted with UTI on 12/14.   Patient Report:  Patient assessed in room given requests to go home. He appears well currently with a benign examination. Currently denies SI/HI and states his symptoms were exacerbated by stressors such as his current living situation.  He states he would be able to go reside with his new group home in [facility]. He endorses having a support system, and aware of emergency resources if needed. Given this, will psychiatric clear patient, provided outpatient resources and instructions to follow up with Pcp in 1-2 days for repeat Parsons. He is understanding of plan. Continues to adamantly deny SI.    ROS:  As above  Collateral information:  Spoke to guardian at length. They were unaware he had been admitted. They speak to him approximately weekly. He had been at or near his baseline for some time, and has been expressing more frustration at prolonged hospitalization recently. They were apparently close to securing placement toward the tail end of his ED stay. Pt frequently expresses unrealistic plans for suicidality such as breathholding, drowning in a cup of water, etc. Discussed I would leave sitter on as long as pt has IV but re-evaluate.   Psychiatric History:  Information collected from pt Hx schizophrenia, depression, catatonia,   Family psych history: unknown   Social History:  Has guardian Both  parents elderly with dementia, decompensated since first group home placement in early 2020s.   Family History:  The patient's family history is not on file.  Medical History: Past Medical History:  Diagnosis Date   Diabetes mellitus without complication ([place])    Hypertension    Prostate cancer ([place])    Schizophrenia ([place])     Surgical History: Past Surgical History:  Procedure Laterality Date   INSERTION PROSTATE RADIATION SEED      Medications:   Current Facility-Administered Medications:    0.45 % sodium chloride infusion, , Intravenous, Continuous, Samtani, Jai-Gurmukh, MD, Last Rate: 50 mL/hr at 01/31/22 1102, New Bag at 01/31/22 1102   atenolol (TENORMIN) tablet 25 mg, 25 mg, Oral, QHS, Reubin Milan, MD, 25 mg at 01/30/22 2208   cloZAPine (CLOZARIL) tablet 300 mg, 300 mg, Oral, QHS, Reubin Milan, MD, 300 mg at 01/30/22 2208   gabapentin (NEURONTIN) capsule 100 mg, 100 mg, Oral, BID, Reubin Milan, MD, 100 mg at 01/31/22 1102   LORazepam (ATIVAN) tablet 2 mg, 2 mg, Oral, BID, Reubin Milan, MD, 2 mg at 01/31/22 1102   LORazepam (ATIVAN) tablet 2 mg, 2 mg, Oral, Daily PRN, Reubin Milan, MD, 2 mg at 01/28/22 1429   nicotine (NICODERM CQ -  dosed in mg/24 hours) patch 14 mg, 14 mg, Transdermal, Daily, Reubin Milan, MD, 14 mg at 01/31/22 1103   ondansetron (ZOFRAN) tablet 4 mg, 4 mg, Oral, Q6H PRN **OR** ondansetron (ZOFRAN) injection 4 mg, 4 mg, Intravenous, Q6H PRN, Reubin Milan, MD   Oral care mouth rinse, 15 mL, Mouth Rinse, PRN, Reubin Milan, MD   sertraline (ZOLOFT) tablet 200 mg, 200 mg, Oral, Daily, Reubin Milan, MD, 200 mg at 01/31/22 1103   tamsulosin (FLOMAX) capsule 0.4 mg, 0.4 mg, Oral, Daily, Reubin Milan, MD, 0.4 mg at 01/31/22 1102  Allergies: Allergies  Allergen Reactions   Acetaminophen Other (See Comments)    Unknown Unable to breathe Chest  tightness/SOB        Objective  Vital  signs:  Temp:  [98 F (36.7 C)-98.5 F (36.9 C)] 98.1 F (36.7 C) (12/21 1354) Pulse Rate:  [53-67] 67 (12/21 1354) Resp:  [16-18] 18 (12/21 1354) BP: (125-144)/(68-88) 144/88 (12/21 1354) SpO2:  [97 %-100 %] 100 % (12/21 1354)  Psychiatric Specialty Exam:  Presentation  General Appearance:  Fairly Groomed  Eye Contact: Good  Speech: Clear and Coherent; Normal Rate  Speech Volume: Normal  Handedness:No data recorded  Mood and Affect  Mood: -- (Today is a good day)  Affect: Congruent   Thought Process  Thought Processes: -- (Repetitive)  Descriptions of Associations:Loose  Orientation:Full (Time, Place and Person)  Thought Content:Rumination  History of Schizophrenia/Schizoaffective disorder:No data recorded Duration of Psychotic Symptoms:No data recorded Hallucinations:Hallucinations: None   Ideas of Reference:-- (No paranoia today)  Suicidal Thoughts:Suicidal Thoughts: No   Homicidal Thoughts:Homicidal Thoughts: No    Sensorium  Memory: Immediate Fair; Recent Fair; Remote Fair  Judgment: Poor  Insight: Shallow   Executive Functions  Concentration: Fair  Attention Span: Fair  Recall: [place] of Knowledge: Fair  Language: Fair   Psychomotor Activity  Psychomotor Activity:Psychomotor Activity: Normal    Assets  Assets: Financial Resources/Insurance; Social Support   Sleep  Sleep:Sleep: Fair     Physical Exam: Physical Exam HENT:     Head: Normocephalic.  Pulmonary:     Effort: Pulmonary effort is normal.  Skin:    Comments: No visible rash  Neurological:     Mental Status: He is alert and oriented to person, place, and time.     Blood pressure (!) 144/88, pulse 67, temperature 98.1 F (36.7 C), temperature source Oral, resp. rate 18, height '5\' 10"'$  (1.778 m), weight 91.2 kg, SpO2 100 %. Body mass index is 28.84 kg/m.

## 2022-01-31 NOTE — Progress Notes (Signed)
Clozapine REMS Program Update:   Patient & MD registered; Labs entered and acceptable.   ANC 3.5 K/uL   Per Clozapine REMS program, acceptable to continue medication. [place] monitoring required weekly while inpatient.   Lindell Spar, PharmD, BCPS Clinical Pharmacist 01/31/2022 7:39 AM

## 2022-02-01 DIAGNOSIS — F200 Paranoid schizophrenia: Secondary | ICD-10-CM | POA: Diagnosis not present

## 2022-02-01 LAB — GLUCOSE, CAPILLARY
Glucose-Capillary: 74 mg/dL (ref 70–99)
Glucose-Capillary: 92 mg/dL (ref 70–99)

## 2022-02-01 MED ORDER — LORAZEPAM 2 MG PO TABS: 2.0000 mg | ORAL_TABLET | Freq: Two times a day (BID) | ORAL | 0 refills | Status: AC

## 2022-02-01 MED ORDER — CLOZAPINE 100 MG PO TABS: 300.0000 mg | ORAL_TABLET | Freq: Every day | ORAL | 0 refills | Status: DC

## 2022-02-01 MED ORDER — NICOTINE 14 MG/24HR TD PT24: 14.0000 mg | MEDICATED_PATCH | Freq: Every day | TRANSDERMAL | 0 refills | Status: AC

## 2022-02-01 MED ORDER — ARIPIPRAZOLE ER 400 MG IM PRSY: 400.0000 mg | PREFILLED_SYRINGE | INTRAMUSCULAR | 0 refills | Status: DC

## 2022-02-01 MED ORDER — PRAVASTATIN SODIUM 40 MG PO TABS: 40.0000 mg | ORAL_TABLET | Freq: Every day | ORAL | 0 refills | Status: AC

## 2022-02-01 MED ORDER — ABILIFY MAINTENA 300 MG IM PRSY: 400.0000 mg | PREFILLED_SYRINGE | INTRAMUSCULAR | 0 refills | Status: DC

## 2022-02-01 MED ORDER — ATENOLOL 25 MG PO TABS: 25.0000 mg | ORAL_TABLET | Freq: Every day | ORAL | 0 refills | Status: AC

## 2022-02-01 MED ORDER — LORAZEPAM 2 MG PO TABS: 2.0000 mg | ORAL_TABLET | Freq: Every day | ORAL | 0 refills | Status: AC | PRN

## 2022-02-01 MED ORDER — TAMSULOSIN HCL 0.4 MG PO CAPS: 0.4000 mg | ORAL_CAPSULE | Freq: Every day | ORAL | 0 refills | Status: DC

## 2022-02-01 MED ORDER — GABAPENTIN 100 MG PO CAPS: 100.0000 mg | ORAL_CAPSULE | Freq: Two times a day (BID) | ORAL | 0 refills | Status: AC

## 2022-02-01 MED ORDER — TAMSULOSIN HCL 0.4 MG PO CAPS: 0.4000 mg | ORAL_CAPSULE | Freq: Every day | ORAL | 0 refills | Status: AC

## 2022-02-01 MED ORDER — SERTRALINE HCL 100 MG PO TABS: 200.0000 mg | ORAL_TABLET | Freq: Every day | ORAL | 0 refills | Status: AC

## 2022-02-01 MED ORDER — LORAZEPAM 2 MG PO TABS: 2.0000 mg | ORAL_TABLET | Freq: Two times a day (BID) | ORAL | 0 refills | Status: DC

## 2022-02-01 MED ORDER — GABAPENTIN 100 MG PO CAPS: 100.0000 mg | ORAL_CAPSULE | Freq: Two times a day (BID) | ORAL | 0 refills | Status: DC

## 2022-02-01 MED ORDER — ARIPIPRAZOLE ER 400 MG IM PRSY: 400.0000 mg | PREFILLED_SYRINGE | INTRAMUSCULAR | 0 refills | Status: AC

## 2022-02-01 MED ORDER — LORAZEPAM 2 MG PO TABS: 2.0000 mg | ORAL_TABLET | Freq: Every day | ORAL | 0 refills | Status: DC | PRN

## 2022-02-01 MED ORDER — CLOZAPINE 100 MG PO TABS: 300.0000 mg | ORAL_TABLET | Freq: Every day | ORAL | 0 refills | Status: AC

## 2022-02-01 MED ORDER — SERTRALINE HCL 100 MG PO TABS: 200.0000 mg | ORAL_TABLET | Freq: Every day | ORAL | 0 refills | Status: DC

## 2022-02-01 MED ORDER — ATENOLOL 25 MG PO TABS: 25.0000 mg | ORAL_TABLET | Freq: Every day | ORAL | 0 refills | Status: DC

## 2022-02-01 NOTE — Plan of Care (Signed)
  Problem: Safety: Goal: Ability to remain free from injury will improve Outcome: Progressing   Problem: Coping: Goal: Level of anxiety will decrease Outcome: Progressing   

## 2022-02-01 NOTE — TOC Transition Note (Signed)
Transition of Care [facility]) - CM/SW Discharge Note   Patient Details  Name: [facility] MRN: [id number] Date of Birth: October 29, 1960  Transition of Care (TOC) CM/SW Contact:  Joaquin Courts, RN Phone Number: 02/01/2022, 11:00 AM   Clinical Narrative:    TOC received notification from [place] that state funding for placement has been approved.  TOC communicated with LG who is also aware of the approval, LG reports has spoken with group home owner who states patient can admit to group home today.  LG reports he is in route to pick up patient, estimated to arrive after 1115 today and transport to new placement in [place].  All medications prescriptions were previously e faxed by EDP in anticipation of funding approval.  No further TOC needs identified.    Final next level of care: Group Home Barriers to Discharge: No Barriers Identified   Patient Goals and CMS Choice CMS Medicare.gov Compare Post Acute Care list provided to:: Patient Represenative (must comment) Choice offered to / list presented to : [place] / [place]  Discharge Placement                         Discharge Plan and Services Additional resources added to the After Visit Summary for   In-house Referral: Clinical Social Work   Post Acute Care Choice: NA          DME Arranged: N/A DME Agency: NA                  Social Determinants of Health ([place]) Interventions SDOH Screenings   Food Insecurity: No Food Insecurity (01/24/2022)  Housing: Low Risk  (01/24/2022)  Transportation Needs: No Transportation Needs (01/24/2022)  Utilities: Not At Risk (01/24/2022)  Alcohol Screen: Medium Risk (12/28/2018)  Tobacco Use: Medium Risk (01/24/2022)     Readmission Risk Interventions     No data to display

## 2022-02-01 NOTE — Progress Notes (Signed)
[facility] and [facility] from [facility] [employer] picked up Pt to be transported to group home. All questions and concerns addressed. Pt not in acute distress, discharged with all of his belongings.

## 2022-02-01 NOTE — Discharge Summary (Addendum)
Physician Discharge Summary  Peter Parsons ZOX:[id number] DOB: 1961-01-29 DOA: 10/23/2021  PCP: System, Provider Not In  Admit date: 10/23/2021 Discharge date: 02/01/2022  Time spent: 46 minutes  Recommendations for Outpatient Follow-up:  Keep on psychotropics and ensure that patient gets Abilify injection on 02/17/2024 Needs follow-up with psychiatry as an outpatient Please ensure that the patient has platelet count done weekly because is on clozapine  Discharge Diagnoses:  MAIN problem for hospitalization   Acute kidney injury as well as UTI from Enterobacter BPH Thrombocytopenia-needs watch on Clozaril Paranoid schizophrenia Active smoker Hypertension   Please see below for itemized issues addressed in [name]- refer to other progress notes for clarity if needed  Discharge Condition: Improved  Diet recommendation: Heart healthy  Filed Weights   10/23/21 1826 01/24/22 1625  Weight: 89.8 kg 91.2 kg    History of present illness:  61 year old M with PMH of schizophrenia, CKD-3A,  HTN, BPH and prostate cancer who was seen in ED waiting placement for the past 3 months and developed AKI and UTI for which hospitalist service was consulted for admission.    Patient had a fever to 101.3 on 12/12.  He had some dysuria and concentrated and dusky urine.  Hemodynamically stable.  UA concerning for UTI. Cr 3.37 (1.6 in 10/2021).  Urine culture and blood culture obtained.  Patient was started on IV fluid and IV ceftriaxone and admitted.    Hospital Course:   AKI on CKD-3A/azotemia: Likely prerenal.  Improving. -Patient was on IV fluid until 12/21 and his acute creatinine resolved down from 3 range -Avoid nephrotoxic meds -UTI was treated   Acute urinary tract infection 2/2 enterobacter with hematuria:  - Urine culture with GNR.  Blood cultures NGTD.  No CVA tenderness. -  did complete abx cefepime on 12/21   Essential hypertension: Normotensive currently -Continue atenolol 25 mg  p.o. bedtime.   Mild hyperglycemia: A1c was 4.7 no need for coverage   BPH with LUTS in the setting of UTI -Continue tamsulosin 0.4 mg daily. -Antibiotics as above, completed 12/21   Thrombocytopenia (HCC) -Monitor platelet count-get weekly Diff as is on Clozapine   hypernatremia -Resolved at this time   Paranoid schizophrenia ([place]) -Continue clozapine 300 mg p.o. daily, lorazepam 2 mg twice daily and as needed, Zoloft 200 mg daily -Patient will need checks on platelet count has is on clozapine which has a rems requirement -Will need next dose of Abilify injection on 02/16/2021 and then every 28 days subsequently -Sitter discontinued 12/21 no longer suicidal and psychiatry oversaw care   Body mass index is 28.84 kg/m.   Discharge Exam: Vitals:   01/31/22 2041 02/01/22 0551  BP: 139/88 132/67  Pulse: 71 (!) 49  Resp: 18 14  Temp: 98.3 F (36.8 C) 98 F (36.7 C)  SpO2: 98% 98%    Subj on day of d/c   Awake coherent pleasant no distress Seems a little down this morning but not depressed not suicidal seems a little apprehensive about leaving  General Exam on discharge  EOMI NCAT Chest clear no added sound no wheeze no rhonchi Abdomen soft no rebound no guarding No lower extremity edema  Discharge Instructions   Discharge Instructions     Diet - low sodium heart healthy   Complete by: As directed    Increase activity slowly   Complete by: As directed       Allergies as of 02/01/2022       Reactions   Acetaminophen Other (See Comments)  Unknown Unable to breathe Chest  tightness/SOB        Medication List     STOP taking these medications    ARIPIPRAZOLE IM Replaced by: ARIPiprazole ER 400 MG Prsy prefilled syringe   chlorhexidine 0.12 % solution Commonly known as: PERIDEX   clonazePAM 0.5 MG tablet Commonly known as: KLONOPIN   escitalopram 10 MG tablet Commonly known as: LEXAPRO   ibuprofen 200 MG tablet Commonly known as: ADVIL    nystatin cream Commonly known as: MYCOSTATIN   polyethylene glycol 17 g packet Commonly known as: MIRALAX / GLYCOLAX   senna 8.6 MG Tabs tablet Commonly known as: SENOKOT   senna-docusate 8.6-50 MG tablet Commonly known as: Senokot-S   sulfamethoxazole-trimethoprim 800-160 MG tablet Commonly known as: BACTRIM DS   traZODone 50 MG tablet Commonly known as: DESYREL   ziprasidone 40 MG capsule Commonly known as: GEODON   ziprasidone 60 MG capsule Commonly known as: GEODON       TAKE these medications    ARIPiprazole ER 400 MG Prsy prefilled syringe Commonly known as: ABILIFY MAINTENA Inject 400 mg into the muscle every 28 (twenty-eight) days. Start taking on: February 16, 2022 Replaces: ARIPIPRAZOLE IM   atenolol 25 MG tablet Commonly known as: TENORMIN Take 1 tablet (25 mg total) by mouth at bedtime.   cloZAPine 100 MG tablet Commonly known as: CLOZARIL Take 3 tablets (300 mg total) by mouth at bedtime.   gabapentin 100 MG capsule Commonly known as: Neurontin Take 1 capsule (100 mg total) by mouth 2 (two) times daily.   LORazepam 2 MG tablet Commonly known as: ATIVAN Take 1 tablet (2 mg total) by mouth daily as needed for anxiety. What changed:  when to take this reasons to take this   LORazepam 2 MG tablet Commonly known as: ATIVAN Take 1 tablet (2 mg total) by mouth 2 (two) times daily. What changed:  medication strength how much to take   nicotine 14 mg/24hr patch Commonly known as: NICODERM CQ - dosed in mg/24 hours Place 1 patch (14 mg total) onto the skin daily.   pravastatin 40 MG tablet Commonly known as: PRAVACHOL Take 1 tablet (40 mg total) by mouth daily.   sertraline 100 MG tablet Commonly known as: ZOLOFT Take 2 tablets (200 mg total) by mouth daily.   tamsulosin 0.4 MG Caps capsule Commonly known as: FLOMAX Take 1 capsule (0.4 mg total) by mouth daily.       Allergies  Allergen Reactions   Acetaminophen Other (See Comments)     Unknown Unable to breathe Chest  tightness/SOB     Follow-up Information     [facility] Follow up.   Why: your [name] health care coordinator is Waldon Reining, she can be reached at [phone] Contact information: Waldon Reining  [phone]                 The results of significant diagnostics from this hospitalization (including imaging, microbiology, ancillary and laboratory) are listed below for reference.    Significant Diagnostic Studies: No results found.  Microbiology: Recent Results (from the past 240 hour(s))  SARS Coronavirus 2 by RT PCR (hospital order, performed in [facility] hospital lab) *cepheid single result test* Anterior Nasal Swab     Status: None   Collection Time: 01/23/22  8:24 AM   Specimen: Anterior Nasal Swab  Result Value Ref Range Status   SARS Coronavirus 2 by RT PCR NEGATIVE NEGATIVE Final    Comment: (  NOTE) SARS-CoV-2 target nucleic acids are NOT DETECTED.  The SARS-CoV-2 RNA is generally detectable in upper and lower respiratory specimens during the acute phase of infection. The lowest concentration of SARS-CoV-2 viral copies this assay can detect is 250 copies / mL. A negative result does not preclude SARS-CoV-2 infection and should not be used as the sole basis for treatment or other patient management decisions.  A negative result may occur with improper specimen collection / handling, submission of specimen other than nasopharyngeal swab, presence of viral mutation(s) within the areas targeted by this assay, and inadequate number of viral copies (<250 copies / mL). A negative result must be combined with clinical observations, patient history, and epidemiological information.  Fact Sheet for Patients:   https://www.patel.info/  Fact Sheet for Healthcare Providers: https://hall.com/  This test is not yet approved or  cleared by the [place] FDA and has  been authorized for detection and/or diagnosis of SARS-CoV-2 by FDA under an Emergency Use Authorization (EUA).  This EUA will remain in effect (meaning this test can be used) for the duration of the COVID-19 declaration under Section 564(b)(1) of the Act, 21 U.S.C. section 360bbb-3(b)(1), unless the authorization is terminated or revoked sooner.  Performed at [facility], [place] [address] [place], [place] [address]   Urine Culture     Status: Abnormal   Collection Time: 01/24/22  6:46 AM   Specimen: Urine, Clean Catch  Result Value Ref Range Status   Specimen Description   Final    URINE, CLEAN CATCH Performed at [facility], [place] [address] [place], [place] [address]    Special Requests   Final    NONE Performed at [facility], [place] [address] [place], [place] [address]    Culture >=100,000 COLONIES/mL ENTEROBACTER CLOACAE (A)  Final   Report Status 01/26/2022 FINAL  Final   Organism ID, Bacteria ENTEROBACTER CLOACAE (A)  Final      Susceptibility   Enterobacter cloacae - MIC*    CEFAZOLIN >=64 RESISTANT Resistant     CEFEPIME <=0.12 SENSITIVE Sensitive     CIPROFLOXACIN <=0.25 SENSITIVE Sensitive     GENTAMICIN <=1 SENSITIVE Sensitive     IMIPENEM <=0.25 SENSITIVE Sensitive     NITROFURANTOIN 32 SENSITIVE Sensitive     TRIMETH/SULFA <=20 SENSITIVE Sensitive     PIP/TAZO <=4 SENSITIVE Sensitive     * >=100,000 COLONIES/mL ENTEROBACTER CLOACAE  Resp panel by RT-PCR (RSV, Flu A&B, Covid) Anterior Nasal Swab     Status: None   Collection Time: 01/24/22 11:43 AM   Specimen: Anterior Nasal Swab  Result Value Ref Range Status   SARS Coronavirus 2 by RT PCR NEGATIVE NEGATIVE Final    Comment: (NOTE) SARS-CoV-2 target nucleic acids are NOT DETECTED.  The SARS-CoV-2 RNA is generally detectable in upper respiratory specimens during the acute phase of infection. The lowest concentration of SARS-CoV-2 viral copies this  assay can detect is 138 copies/mL. A negative result does not preclude SARS-Cov-2 infection and should not be used as the sole basis for treatment or other patient management decisions. A negative result may occur with  improper specimen collection/handling, submission of specimen other than nasopharyngeal swab, presence of viral mutation(s) within the areas targeted by this assay, and inadequate number of viral copies(<138 copies/mL). A negative result must be combined with clinical observations, patient history, and epidemiological information. The expected result is Negative.  Fact Sheet for Patients:  EntrepreneurPulse.com.au  Fact Sheet for Healthcare Providers:  IncredibleEmployment.be  This test is no t yet approved or cleared by the [place] and  has been authorized for detection and/or diagnosis of SARS-CoV-2 by FDA under an Emergency Use Authorization (EUA). This EUA will remain  in effect (meaning this test can be used) for the duration of the COVID-19 declaration under Section 564(b)(1) of the Act, 21 U.S.C.section 360bbb-3(b)(1), unless the authorization is terminated  or revoked sooner.       Influenza A by PCR NEGATIVE NEGATIVE Final   Influenza B by PCR NEGATIVE NEGATIVE Final    Comment: (NOTE) The Xpert Xpress SARS-CoV-2/FLU/RSV plus assay is intended as an aid in the diagnosis of influenza from Nasopharyngeal swab specimens and should not be used as a sole basis for treatment. Nasal washings and aspirates are unacceptable for Xpert Xpress SARS-CoV-2/FLU/RSV testing.  Fact Sheet for Patients: EntrepreneurPulse.com.au  Fact Sheet for Healthcare Providers: IncredibleEmployment.be  This test is not yet approved or cleared by the [place] FDA and has been authorized for detection and/or diagnosis of SARS-CoV-2 by FDA under an Emergency Use Authorization (EUA). This EUA will  remain in effect (meaning this test can be used) for the duration of the COVID-19 declaration under Section 564(b)(1) of the Act, 21 U.S.C. section 360bbb-3(b)(1), unless the authorization is terminated or revoked.     Resp Syncytial Virus by PCR NEGATIVE NEGATIVE Final    Comment: (NOTE) Fact Sheet for Patients: EntrepreneurPulse.com.au  Fact Sheet for Healthcare Providers: IncredibleEmployment.be  This test is not yet approved or cleared by the [place] FDA and has been authorized for detection and/or diagnosis of SARS-CoV-2 by FDA under an Emergency Use Authorization (EUA). This EUA will remain in effect (meaning this test can be used) for the duration of the COVID-19 declaration under Section 564(b)(1) of the Act, 21 U.S.C. section 360bbb-3(b)(1), unless the authorization is terminated or revoked.  Performed at [facility], [place] [address], [place], [place] [address]   Culture, blood (routine x 2)     Status: None   Collection Time: 01/24/22 12:10 PM   Specimen: BLOOD RIGHT FOREARM  Result Value Ref Range Status   Specimen Description   Final    BLOOD RIGHT FOREARM Performed at [facility] [address] [place], [place] [address]    Special Requests   Final    BOTTLES DRAWN AEROBIC AND ANAEROBIC Blood Culture results may not be optimal due to an excessive volume of blood received in culture bottles Performed at [facility] [address], [place], [place] [address]    Culture   Final    NO GROWTH 5 DAYS Performed at [facility], [place] [address], [place], [place] [address]    Report Status 01/29/2022 FINAL  Final  Culture, blood (routine x 2)     Status: None   Collection Time: 01/24/22 12:10 PM   Specimen: BLOOD LEFT FOREARM  Result Value Ref Range Status   Specimen Description   Final    BLOOD LEFT FOREARM Performed at [facility] [address] [place], [place] [address]    Special Requests   Final    BOTTLES DRAWN AEROBIC AND ANAEROBIC Blood Culture results may not be optimal due to an excessive volume of blood received in culture bottles Performed at [facility] [address], [place], [place] [address]    Culture   Final    NO GROWTH 5 DAYS Performed at [facility], [address] [place], [place] [address]    Report Status 01/29/2022 FINAL  Final     Labs: Basic Metabolic Panel: Recent Labs  Lab 01/26/22 0353 01/27/22 0336 01/28/22 0401 01/29/22 0356 01/30/22 0327 01/31/22 0327  NA 146* 141 141 144 143 143  K 4.2 3.9 3.8 3.9 3.9 4.3  CL 112* 111 109 110 110 111  CO2 '26 23 26 25 26 26  '$ GLUCOSE 94 86 90 91 107* 85  BUN 43* 35* 42* 36* 41* 42*  CREATININE 1.90* 1.54* 1.69* 1.63* 1.38* 1.56*  CALCIUM 8.5* 7.9* 8.2* 8.6* 8.2* 8.6*  MG 2.0  --   --   --   --   --   PHOS 3.6  --   --   --   --   --    Liver Function Tests: Recent Labs  Lab 01/26/22 0353 01/27/22 0336 01/28/22 0401 01/29/22 0356 01/30/22 0327  AST  --  14* '16 19 25  '$ ALT  --  '11 14 15 19  '$ ALKPHOS  --  57 61 59 74  BILITOT  --  0.4 0.5 0.3 0.4  PROT  --  5.9* 6.4* 6.6 6.5  ALBUMIN 3.2* 3.0* 3.2* 3.3* 3.4*   No results for input(s): "LIPASE", "AMYLASE" in the last 168 hours. No results for input(s): "AMMONIA" in the last 168 hours. CBC: Recent Labs  Lab 01/27/22 0336 01/28/22 0401 01/29/22 0356 01/30/22 0327 01/31/22 0327  WBC 4.8 6.1 6.4 5.9 6.4  NEUTROABS  --   --   --   --  3.5  HGB 11.0* 11.3* 11.8* 12.2* 12.4*  HCT 32.2* 33.2* 34.3* 34.4* 37.3*  MCV 88.0 86.0 86.2 85.4 86.9  PLT 90* 101* 114* 144* 157   Cardiac Enzymes: No results for input(s): "CKTOTAL", "CKMB", "CKMBINDEX", "TROPONINI" in the last 168 hours. BNP: BNP (last 3 results) No results for input(s): "BNP" in the last 8760 hours.  ProBNP (last 3 results) No results for input(s): "PROBNP" in the last 8760  hours.  CBG: Recent Labs  Lab 01/31/22 0843 01/31/22 1241 01/31/22 1642 01/31/22 2043 02/01/22 0857  GLUCAP 106* 88 123* 132* 74       Signed:  Nita Sells MD   [facility] 02/01/2022, 9:30 AM
